# Patient Record
Sex: Female | Born: 1935 | Race: White | Hispanic: No | Smoking: Never smoker
Health system: Southern US, Community
[De-identification: ages and names within clinical notes are randomized; demographics above are authoritative.]

## PROBLEM LIST (undated history)

## (undated) DIAGNOSIS — I4891 Unspecified atrial fibrillation: Secondary | ICD-10-CM

## (undated) DIAGNOSIS — I639 Cerebral infarction, unspecified: Secondary | ICD-10-CM

## (undated) DIAGNOSIS — F419 Anxiety disorder, unspecified: Secondary | ICD-10-CM

## (undated) DIAGNOSIS — K635 Polyp of colon: Secondary | ICD-10-CM

## (undated) DIAGNOSIS — G4730 Sleep apnea, unspecified: Secondary | ICD-10-CM

## (undated) DIAGNOSIS — E785 Hyperlipidemia, unspecified: Secondary | ICD-10-CM

## (undated) DIAGNOSIS — F32A Depression, unspecified: Secondary | ICD-10-CM

## (undated) DIAGNOSIS — I10 Essential (primary) hypertension: Secondary | ICD-10-CM

## (undated) DIAGNOSIS — F329 Major depressive disorder, single episode, unspecified: Secondary | ICD-10-CM

## (undated) DIAGNOSIS — J45909 Unspecified asthma, uncomplicated: Secondary | ICD-10-CM

## (undated) DIAGNOSIS — E039 Hypothyroidism, unspecified: Secondary | ICD-10-CM

## (undated) DIAGNOSIS — C4490 Unspecified malignant neoplasm of skin, unspecified: Secondary | ICD-10-CM

## (undated) HISTORY — DX: Unspecified asthma, uncomplicated: J45.909

## (undated) HISTORY — DX: Depression, unspecified: F32.A

## (undated) HISTORY — PX: SINUS EXPLORATION: SHX5214

## (undated) HISTORY — DX: Anxiety disorder, unspecified: F41.9

## (undated) HISTORY — PX: COLONOSCOPY: SHX174

## (undated) HISTORY — DX: Unspecified malignant neoplasm of skin, unspecified: C44.90

## (undated) HISTORY — DX: Polyp of colon: K63.5

## (undated) HISTORY — PX: MESH APPLIED TO LAP PORT: SHX5969

## (undated) HISTORY — DX: Cerebral infarction, unspecified: I63.9

## (undated) HISTORY — DX: Essential (primary) hypertension: I10

## (undated) HISTORY — PX: ADENOIDECTOMY: SUR15

## (undated) HISTORY — DX: Hyperlipidemia, unspecified: E78.5

## (undated) HISTORY — DX: Hypothyroidism, unspecified: E03.9

## (undated) HISTORY — PX: CATARACT EXTRACTION: SUR2

## (undated) HISTORY — DX: Sleep apnea, unspecified: G47.30

---
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
## 1958-01-18 HISTORY — PX: APPENDECTOMY: SHX54

## 2006-01-18 DIAGNOSIS — I639 Cerebral infarction, unspecified: Secondary | ICD-10-CM

## 2006-01-18 HISTORY — DX: Cerebral infarction, unspecified: I63.9

## 2012-01-19 HISTORY — PX: LUMBAR DISC SURGERY: SHX700

## 2017-02-04 LAB — BASIC METABOLIC PANEL
BUN: 13 (ref 4–21)
Creatinine: 0.9 (ref <=1.1)
Glucose: 102
Potassium: 4.2 (ref 3.4–5.3)
Sodium: 144 (ref 137–147)

## 2017-06-14 LAB — CBC AND DIFFERENTIAL
HCT: 45 (ref 36–46)
Hemoglobin: 14.5 (ref 12.0–16.0)
Platelets: 280 (ref 150–399)
WBC: 7.5

## 2017-08-01 ENCOUNTER — Encounter: Payer: Self-pay | Admitting: Internal Medicine

## 2017-08-09 ENCOUNTER — Ambulatory Visit: Payer: Self-pay | Admitting: Internal Medicine

## 2017-08-16 ENCOUNTER — Ambulatory Visit: Payer: Self-pay | Admitting: Internal Medicine

## 2017-09-06 ENCOUNTER — Non-Acute Institutional Stay: Payer: Medicare Other | Admitting: Internal Medicine

## 2017-09-06 ENCOUNTER — Encounter: Payer: Self-pay | Admitting: Internal Medicine

## 2017-09-06 VITALS — BP 122/60 | HR 75 | Temp 97.8°F | Resp 18 | Ht 63.0 in | Wt 148.0 lb

## 2017-09-06 DIAGNOSIS — I10 Essential (primary) hypertension: Secondary | ICD-10-CM | POA: Insufficient documentation

## 2017-09-06 DIAGNOSIS — J329 Chronic sinusitis, unspecified: Secondary | ICD-10-CM

## 2017-09-06 DIAGNOSIS — E785 Hyperlipidemia, unspecified: Secondary | ICD-10-CM

## 2017-09-06 DIAGNOSIS — Z8673 Personal history of transient ischemic attack (TIA), and cerebral infarction without residual deficits: Secondary | ICD-10-CM | POA: Insufficient documentation

## 2017-09-06 DIAGNOSIS — F411 Generalized anxiety disorder: Secondary | ICD-10-CM

## 2017-09-06 DIAGNOSIS — E039 Hypothyroidism, unspecified: Secondary | ICD-10-CM

## 2017-09-06 DIAGNOSIS — K219 Gastro-esophageal reflux disease without esophagitis: Secondary | ICD-10-CM

## 2017-09-06 MED ORDER — DIAZEPAM 5 MG PO TABS: ORAL_TABLET | ORAL | 1 refills | Status: DC

## 2017-09-06 NOTE — Progress Notes (Signed)
[facility]  Provider: Blanchie Serve MD   Location:      Place of Service:     PCP: System, Pcp Not In [name] Care Team: System, Pcp Not In as PCP - General  Extended Emergency Contact Information Primary Emergency Contact: Darlene [name] Mobile Phone: [phone] Relation: Brother   Goals of Care: Advanced Directive information No flowsheet data found.    Chief Complaint  [name] presents with  . New [name] (Initial Visit)    Establishing care with [facility]. No acute concerns at this time  . Medication Refill    No refills needed at this time    HPI: [name] is a 84 y.o. female seen today to establish care. She resides in independent living. She moved here two and a half week. Her husband has dementia and resides in [facility]. She has a brother in town. She moved from [place], [place]. She was seeing Dr Darlene [name] in [place] and was last seen 3-4 weeks back for sinus issues. During this visit, she is mostly talking about her husband.   Hypertension- currently on losartan 50 mg daily, no headache or chest pain, denies dyspnea.   GAD- takes diazepam 5 mg three times a day. Appears anxious this visit.   Hyperlipidemia- takes pravastatin 40 mg daily  Tylenol 650 mg daily as needed  Hypothyroidism- takes levothyroxine 100 mcg daily, has been on it for several years.   Chronic sinusitis- takes azelastine and flonase nasal spray.  History of stroke- 11 years back and has lost most of her left peripheral vision. Currently on plavix daily. On losartan for hypertension. Also takes pravastatin 40 mg daily.   GERD- takes dexilant 30 mg daily as needed   Past Medical History:  Diagnosis Date  . Hyperlipidemia   . Hypertension   . Hypothyroidism   . Sleep apnea   . Stroke [facility]) 2008   Past Surgical History:  Procedure Laterality Date  . APPENDECTOMY  1960  . [place] SURGERY  2014  . TONSILLECTOMY  1940    reports that she has never smoked.  She has never used smokeless tobacco. She reports that she drinks about 2.0 standard drinks of alcohol per week. She reports that she does not use drugs. Social History   Socioeconomic History  . Marital status: Divorced    Spouse name: Not on file  . Number of children: Not on file  . Years of education: Not on file  . Highest education level: Not on file  Occupational History  . Not on file  Social Needs  . Financial resource strain: Not on file  . Food insecurity:    Worry: Not on file    Inability: Not on file  . Transportation needs:    Medical: Not on file    Non-medical: Not on file  Tobacco Use  . Smoking status: Never Smoker  . Smokeless tobacco: Never Used  Substance and Sexual Activity  . Alcohol use: Yes    Alcohol/week: 2.0 standard drinks    Types: 2 Glasses of wine per week  . Drug use: Never  . Sexual activity: Not on file  Lifestyle  . Physical activity:    Days per week: Not on file    Minutes per session: Not on file  . Stress: Not on file  Relationships  . Social connections:    Talks on phone: Not on file    Gets together: Not on file    Attends religious service:  Not on file    Active member of club or organization: Not on file    Attends meetings of clubs or organizations: Not on file    Relationship status: Not on file  . Intimate partner violence:    Fear of current or ex partner: Not on file    Emotionally abused: Not on file    Physically abused: Not on file    Forced sexual activity: Not on file  Other Topics Concern  . Not on file  Social History Narrative   Social History      Diet? ok      Do you drink/eat things with caffeine? Weak coffee, (much milk)      Marital status?          divorced                          What year were you married? 1961      Do you live in a house, apartment, assisted living, condo, trailer, etc.? apartment      Is it one or more stories? 4      How many persons live in your home? Only me      Do  you have any pets in your home? (please list) no      Highest level of education completed? 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             yes                         Type & how often? Walk- would love to return to water exercise      Advanced Directives      Do you have a living will? yes      Do you have a DNR form?           no                       If not, do you want to discuss one? no      Do you have signed POA/HPOA for forms? no      Functional Status      Do you have difficulty bathing or dressing yourself?  no      Do you have difficulty preparing food or eating? no      Do you have difficulty managing your medications? no      Do you have difficulty managing your finances?  no      Do you have difficulty affording your medications? no       Functional Status Survey:    Family History  Problem Relation Age of Onset  . Lung cancer Mother 67  . Stroke Father   . Cancer Maternal Grandfather   . Heart attack Paternal Grandfather   . Psoriasis Daughter   . Rheum arthritis Daughter   . Bipolar disorder Daughter     Health Maintenance  Topic Date Due  . DEXA SCAN  07/17/2000  . PNA vac Low Risk Adult (1 of 2 - PCV13) 07/17/2000  . INFLUENZA VACCINE  08/18/2017  . TETANUS/TDAP  11/26/2018    Allergies  Allergen Reactions  . Dust Mite Extract   . Oxycontin [Oxycodone Hcl] Nausea And Vomiting  . Pollen Extract     Outpatient Encounter  Medications as of 09/06/2017  Medication Sig  . acetaminophen (TYLENOL) 325 MG tablet Take 650 mg by mouth as needed (for sinus headache).  Marland Kitchen azelastine (ASTELIN) 0.1 % nasal spray Place into both nostrils as needed for rhinitis. 2 sprays in the right nostril and 1 spray in the left nostril  . clopidogrel (PLAVIX) 75 MG tablet Take 75 mg by mouth daily.  Marland Kitchen Dexlansoprazole (DEXILANT) 30 MG capsule Take 30 mg by mouth daily as needed.  . diazepam  (VALIUM) 5 MG tablet Take 5 mg by mouth 3 (three) times daily.  . fluticasone (FLONASE) 50 MCG/ACT nasal spray Place 1 spray into both nostrils daily. 2 sprays in the right nostril and 1 spray in the left nostril  . levothyroxine (SYNTHROID, LEVOTHROID) 100 MCG tablet Take 100 mcg by mouth daily before breakfast.  . losartan (COZAAR) 50 MG tablet Take 50 mg by mouth daily.  . pravastatin (PRAVACHOL) 40 MG tablet Take 40 mg by mouth at bedtime.    No facility-administered encounter medications on file as of 09/06/2017.     Review of Systems  Constitutional: Negative for appetite change, chills and fever.  HENT: Positive for congestion. Negative for ear discharge, ear pain, hearing loss, mouth sores, postnasal drip, rhinorrhea, sore throat and trouble swallowing.   Eyes: Negative for pain and itching.       Poor peripheral vision to left eye s/p stroke  Respiratory: Negative for cough, choking, shortness of breath and wheezing.        Has had choking issues in past, last one a year back  Cardiovascular: Negative for chest pain and palpitations.  Gastrointestinal: Negative for abdominal pain, constipation, diarrhea, nausea and vomiting.       Colonoscopy and EGD 3 years back, no abnormality noted, was started on dexilant as needed about 2 weeks back and used it for a week  Genitourinary: Negative for dysuria and flank pain.  Musculoskeletal: Negative for arthralgias, back pain and gait problem.  Skin: Negative for rash.       Has had history of keratosis  Psychiatric/Behavioral: Negative for behavioral problems. The [name] is nervous/anxious.     Vitals:   09/06/17 1012  BP: 122/60  Pulse: 75  Resp: 18  Temp: 97.8 F (36.6 C)  TempSrc: Oral  SpO2: 96%  Weight: 148 lb (67.1 kg)  Height: 5\' 3"  (1.6 m)   Body mass index is 26.22 kg/m. Physical Exam  Constitutional: She is oriented to person, place, and time. She appears well-developed and well-nourished. No distress.  HENT:    Head: Normocephalic and atraumatic.  Right Ear: External ear normal.  Left Ear: External ear normal.  Nose: Nose normal.  Mouth/Throat: Oropharynx is clear and moist. No oropharyngeal exudate.  Eyes: Pupils are equal, round, and reactive to light. Conjunctivae and EOM are normal. Right eye exhibits no discharge. Left eye exhibits no discharge.  Neck: Normal range of motion. Neck supple.  Cardiovascular: Normal rate and regular rhythm.  Pulmonary/Chest: Effort normal and breath sounds normal. No respiratory distress. She has no wheezes. She has no rales.  Abdominal: Soft. Bowel sounds are normal. There is no tenderness. There is no guarding.  Musculoskeletal: Normal range of motion. She exhibits no edema or tenderness.  Lymphadenopathy:    She has no cervical adenopathy.  Neurological: She is alert and oriented to person, place, and time. She exhibits normal muscle tone.  Skin: Skin is warm and dry. Capillary refill takes less than 2 seconds. She is not diaphoretic.  Psychiatric: She has a normal mood and affect.    Labs reviewed: Basic Metabolic Panel: No results for input(s): NA, K, CL, CO2, GLUCOSE, BUN, CREATININE, CALCIUM, MG, PHOS in the last 8760 hours. Liver Function Tests: No results for input(s): AST, ALT, ALKPHOS, BILITOT, PROT, ALBUMIN in the last 8760 hours. No results for input(s): LIPASE, AMYLASE in the last 8760 hours. No results for input(s): AMMONIA in the last 8760 hours. CBC: No results for input(s): WBC, NEUTROABS, HGB, HCT, MCV, PLT in the last 8760 hours. Cardiac Enzymes: No results for input(s): CKTOTAL, CKMB, CKMBINDEX, TROPONINI in the last 8760 hours. BNP: Invalid input(s): POCBNP No results found for: HGBA1C No results found for: TSH No results found for: VITAMINB12 No results found for: FOLATE No results found for: IRON, TIBC, FERRITIN  Lipid Panel: No results for input(s): CHOL, HDL, LDLCALC, TRIG, CHOLHDL, LDLDIRECT in the last 8760 hours. No  results found for: HGBA1C  Procedures since last visit: No results found.  Assessment/Plan  1. Essential hypertension Continue losartan current regimen. Obtain previous records and then schedule for lab prior to next visit.   2. History of CVA (cerebrovascular accident) Continue plavix, statin and antihypertensive obvtain prior records including imaging  3. Hyperlipidemia, unspecified hyperlipidemia type Continue pravastatin 40 mg daily.   4. GAD (generalized anxiety disorder) Continue diazepam 5 mg half tablet in am as needed and full tablet at night as needed for anxiety, recommended psychotherapy, pt is looking into establishing care with one.   5. Acquired hypothyroidism Continue levothyroxine, obtain previous lab results  6. Chronic sinusitis, unspecified location Continue nasal spray  7. Gastroesophageal reflux disease, esophagitis presence not specified Continue dexilant as needed, monitor clinically.     Labs/tests ordered:  None for now  Next appointment: 3 months with [facility]  Communication: reviewed care plan with [name]    Blanchie Serve, MD Internal Medicine [place], [place] [address] Cell Phone (Monday-Friday 8 am - 5 pm): [phone] On Call: [phone] and follow prompts after 5 pm and on weekends Office Phone: [phone] Office Fax: [phone]

## 2017-09-27 ENCOUNTER — Telehealth: Payer: Self-pay | Admitting: Internal Medicine

## 2017-09-27 NOTE — Telephone Encounter (Signed)
I left a message asking the patient to call me at [phone] to schedule AWV-I with Clarise Cruz at [facility] on afternoon of 10/04/17 if available. VDM (DD)

## 2017-09-28 ENCOUNTER — Telehealth: Payer: Self-pay | Admitting: *Deleted

## 2017-09-28 NOTE — Telephone Encounter (Signed)
Patient called requesting referrals for cardiology, ENT, dermatology, she stated that she has a family history of heart problems and sinus problems, skin issues.

## 2017-09-28 NOTE — Telephone Encounter (Signed)
I would like to review some of her old medical records from her prior PCP office. Please make sure a signed medical release form has been faxed to her previous PCP office and records obtained- this should include recent lab work, EKG, other imagings like MRI, CT scan and Xrays if present and any sub speciality note if present. She does not have a known cardiac issue at present that I see in her chart. Was she seeing an ENT provider, a cardiologist or dermatologist while in [place]? Please let me know the name of the provider and when was she last seen by them after talking to the patient.

## 2017-10-04 NOTE — Telephone Encounter (Signed)
Patient's records was given to Dr Bubba Camp on 09/29/2017.

## 2017-10-05 ENCOUNTER — Other Ambulatory Visit: Payer: Self-pay | Admitting: *Deleted

## 2017-10-05 NOTE — Telephone Encounter (Signed)
Called patient to see if the medical records release form was suppose to be for my office or for another office since Darlene [name] received this form and brought it to me. She stated that she was not quite sure, I infored her that I would call Dr. Einar Gip office tomorrow to find out and I will get back with her since I have her records already.

## 2017-10-07 ENCOUNTER — Other Ambulatory Visit: Payer: Self-pay | Admitting: *Deleted

## 2017-10-07 MED ORDER — DIAZEPAM 5 MG PO TABS: ORAL_TABLET | ORAL | 0 refills | Status: DC

## 2017-10-07 MED ORDER — DIAZEPAM 5 MG PO TABS: 5.0000 mg | ORAL_TABLET | Freq: Two times a day (BID) | ORAL | 1 refills | Status: DC | PRN

## 2017-10-07 NOTE — Telephone Encounter (Signed)
Patient called to get refill, new to [facility]. She stated that she needs to start getting this medication from Dr. Bubba Camp.

## 2017-10-18 ENCOUNTER — Other Ambulatory Visit: Payer: Self-pay | Admitting: *Deleted

## 2017-10-18 MED ORDER — CLOPIDOGREL BISULFATE 75 MG PO TABS: 75.0000 mg | ORAL_TABLET | Freq: Every day | ORAL | 1 refills | Status: DC

## 2017-10-18 NOTE — Telephone Encounter (Signed)
Patient requested refill

## 2017-11-10 ENCOUNTER — Telehealth: Payer: Self-pay | Admitting: *Deleted

## 2017-11-10 DIAGNOSIS — J329 Chronic sinusitis, unspecified: Secondary | ICD-10-CM

## 2017-11-10 NOTE — Telephone Encounter (Signed)
Patient called and stated that she just moved here and resides at [facility]. Stated that she would like a referral to a ENT near [facility]. Patient stated that she has chronic sinus problems throughout the year. Please Advise.

## 2017-11-11 NOTE — Telephone Encounter (Signed)
Called patient and she stated that she would rather go ahead an get started with the specialist.

## 2017-11-11 NOTE — Telephone Encounter (Signed)
We can see her in clinic ASAP unless she wants to see ENT only.

## 2017-11-11 NOTE — Telephone Encounter (Signed)
Faxed last note, insurance information and referral paperwork.

## 2017-11-11 NOTE — Telephone Encounter (Signed)
Spoke with patient regarding her referral, she stated that she is having to get up several times during the night to clear her nasal passages. She also stated that she would like to see someone as soon as possible.

## 2017-11-28 ENCOUNTER — Telehealth: Payer: Self-pay | Admitting: *Deleted

## 2017-11-28 NOTE — Telephone Encounter (Signed)
Patient called regarding her (L) knee being infected, she stated that she had some cancerous cells removed and the knee had become infected. She stated that she was given some antibiotics but they have not been working. She has been confined to her apartment because she is unable to get dress to go out. I told her that I would have the Independent nurse to come and access the wound and I would also schedule her appointment for her at 1:00 pm on Thursday to see [facility] [name] NP. She stated that she had a scheduled appointment for this month.

## 2017-12-01 ENCOUNTER — Non-Acute Institutional Stay: Payer: Medicare Other | Admitting: Nurse Practitioner

## 2017-12-01 ENCOUNTER — Encounter: Payer: Self-pay | Admitting: Nurse Practitioner

## 2017-12-01 DIAGNOSIS — F411 Generalized anxiety disorder: Secondary | ICD-10-CM

## 2017-12-01 DIAGNOSIS — R32 Unspecified urinary incontinence: Secondary | ICD-10-CM

## 2017-12-01 DIAGNOSIS — E039 Hypothyroidism, unspecified: Secondary | ICD-10-CM

## 2017-12-01 DIAGNOSIS — T8149XA Infection following a procedure, other surgical site, initial encounter: Secondary | ICD-10-CM | POA: Diagnosis not present

## 2017-12-01 DIAGNOSIS — Z8673 Personal history of transient ischemic attack (TIA), and cerebral infarction without residual deficits: Secondary | ICD-10-CM

## 2017-12-01 DIAGNOSIS — I10 Essential (primary) hypertension: Secondary | ICD-10-CM | POA: Diagnosis not present

## 2017-12-01 DIAGNOSIS — E785 Hyperlipidemia, unspecified: Secondary | ICD-10-CM | POA: Diagnosis not present

## 2017-12-01 DIAGNOSIS — J329 Chronic sinusitis, unspecified: Secondary | ICD-10-CM

## 2017-12-01 LAB — COMPLETE METABOLIC PANEL WITH GFR
Calcium: 10.1
Chloride: 105
Vitamin B6: 30

## 2017-12-01 NOTE — Assessment & Plan Note (Signed)
Blood pressure is in control, continue Losartan 83m qd, update CMP eGFR

## 2017-12-01 NOTE — Assessment & Plan Note (Signed)
Stable, continue Flonase, update CBC

## 2017-12-01 NOTE — Progress Notes (Signed)
Location:   clinic [place]   Place of Service:  Clinic (12) clinic [place]  Provider: Marlana Latus NP  Code Status: DNR Goals of Care: No flowsheet data found.   Chief Complaint  Patient presents with  . Acute Visit    C/o-(R) knee infected    HPI: Patient is a 84 y.o. female seen today for an acute visit for left knee infected surgical wound from skin lesion removal, s/p ABT. Hx of HTN, blood pressure is controlled on Losartan 81m qd. Hx of hypothyroidism on Levothyroxine 1022m qd, no recent labs. She takes Pravastatin 4078md for hyperlipidemia control. Hx of CVA, no apparent residual, on Plavix and pravastatin for cardiovascular risk reduction. Chronic sinusitis, stable, on Flonase nasal pray.   Past Medical History:  Diagnosis Date  . Hyperlipidemia   . Hypertension   . Hypothyroidism   . Sleep apnea   . [facility]    Past Surgical History:  Procedure Laterality Date  . [place]  1960  [place]  . TONSILLECTOMY  1940    Allergies  Allergen Reactions  . Dust Mite Extract   . Oxycontin [Oxycodone Hcl] Nausea And Vomiting  . Pollen Extract     Allergies as of 12/01/2017      Reactions   Dust Mite Extract    Oxycontin [oxycodone Hcl] Nausea And Vomiting   Pollen Extract       Medication List        Accurate as of 12/01/17  1:56 PM. Always use your most recent med list.          acetaminophen 325 MG tablet Commonly known as:  TYLENOL Take 650 mg by mouth as needed (for sinus headache).   azelastine 0.1 % nasal spray Commonly known as:  ASTELIN Place into both nostrils as needed for rhinitis. 2 sprays in the right nostril and 1 spray in the left nostril   cephALEXin 500 MG capsule Commonly known as:  KEFLEX Take 500 mg by mouth 2 (two) times daily.   clopidogrel 75 MG tablet Commonly known as:  PLAVIX Take 1 tablet (75 mg total) by mouth daily.   DEXILANT 30 MG capsule Generic drug:  Dexlansoprazole Take 30 mg by mouth  daily as needed.   diazepam 5 MG tablet Commonly known as:  VALIUM Take 1 tablet (5 mg total) by mouth every 12 (twelve) hours as needed for anxiety.   fluticasone 50 MCG/ACT nasal spray Commonly known as:  FLONASE Place 1 spray into both nostrils daily. 2 sprays in the right nostril and 1 spray in the left nostril   levothyroxine 100 MCG tablet Commonly known as:  SYNTHROID, LEVOTHROID Take 100 mcg by mouth daily before breakfast.   losartan 50 MG tablet Commonly known as:  COZAAR Take 50 mg by mouth daily.   pravastatin 40 MG tablet Commonly known as:  PRAVACHOL Take 40 mg by mouth at bedtime.       Review of Systems:  Review of Systems  Constitutional: Negative for activity change, appetite change, chills, diaphoresis, fatigue, fever and unexpected weight change.  HENT: Positive for hearing loss. Negative for congestion, rhinorrhea, sinus pressure, sinus pain and sore throat.   Eyes:       Hx of poor left peripheral vision from Hx of CVA  Respiratory: Negative for cough, shortness of breath and wheezing.   Gastrointestinal: Negative for abdominal distention, abdominal pain, constipation, diarrhea, nausea and vomiting.  Genitourinary: Negative for difficulty urinating,  dysuria and urgency.       Incontinent urine.   Musculoskeletal: Positive for arthralgias and gait problem.  Skin: Negative for color change.       Left knee infected surgical wound.   Neurological: Negative for dizziness, facial asymmetry, speech difficulty, weakness and headaches.       Left knee reflex is sluggish since the back surgery.   Psychiatric/Behavioral: Negative for agitation, behavioral problems, hallucinations and sleep disturbance. The patient is nervous/anxious.     Health Maintenance  Topic Date Due  . DEXA SCAN  07/17/2000  . PNA vac Low Risk Adult (2 of 2 - PCV13) 01/18/2006  . INFLUENZA VACCINE  08/18/2017  . TETANUS/TDAP  11/26/2018    Physical Exam: Vitals:   12/01/17 1304    BP: 136/80  Pulse: 77  Resp: 18  Temp: (!) 97.4 F (36.3 C)  TempSrc: Oral  SpO2: 95%  Weight: 147 lb 9.6 oz (67 kg)  Height: 5' 3"  (1.6 m)   Body mass index is 26.15 kg/m. Physical Exam  Constitutional: She is oriented to person, place, and time. She appears well-developed and well-nourished.  HENT:  Head: Normocephalic and atraumatic.  Eyes: Pupils are equal, round, and reactive to light. EOM are normal.  Left peripheral vision loss  Neck: Normal range of motion. Neck supple. No JVD present. No thyromegaly present.  Cardiovascular: Normal rate and regular rhythm.  No murmur heard. Pulmonary/Chest: Effort normal. She has no wheezes. She has no rales.  Abdominal: Soft. She exhibits no distension. There is no tenderness. There is no rebound and no guarding.  Musculoskeletal: Normal range of motion.  Ambulates independently.   Neurological: She is alert and oriented to person, place, and time. No cranial nerve deficit. She exhibits normal muscle tone. Coordination normal.  Skin: Skin is warm and dry.  Left knee infected surgical wound, a quarter sized excision wound is covered with yellow scant drainage, peri wound erythema, slightly warmth and tenderness.  Psychiatric: She has a normal mood and affect. Her behavior is normal. Judgment and thought content normal.    Labs reviewed: Basic Metabolic Panel: No results for input(s): NA, K, CL, CO2, GLUCOSE, BUN, CREATININE, CALCIUM, MG, PHOS, TSH in the last 8760 hours. Liver Function Tests: No results for input(s): AST, ALT, ALKPHOS, BILITOT, PROT, ALBUMIN in the last 8760 hours. No results for input(s): LIPASE, AMYLASE in the last 8760 hours. No results for input(s): AMMONIA in the last 8760 hours. CBC: No results for input(s): WBC, NEUTROABS, HGB, HCT, MCV, PLT in the last 8760 hours. Lipid Panel: No results for input(s): CHOL, HDL, LDLCALC, TRIG, CHOLHDL, LDLDIRECT in the last 8760 hours. No results found for:  HGBA1C  Procedures since last visit: No results found.  Assessment/Plan Acquired hypothyroidism Continue Levothyroxine 129mg qd, update TSH  Essential hypertension Blood pressure is in control, continue Losartan 561mqd, update CMP eGFR  Hyperlipidemia Continue Pravastatin, update lipid panel.   History of CVA (cerebrovascular accident) No apparent residual, continue Pravastatin and Plavix for cardiovascular risk reduction.   Chronic sinusitis Stable, continue Flonase, update CBC  Infected surgical wound Medial the right knee, will apply Bacitracin oint bid to affected the area until healed.   GAD (generalized anxiety disorder) Prn Valium 54m34mid is adequate for sleep and anxiety.   Incontinent of urine Hx of it, failed surgery, wear adult depends.     Labs/tests ordered: CBC/diff, CMP eGFR, TSH, lipid panel.   Next appt: 4 months

## 2017-12-01 NOTE — Assessment & Plan Note (Signed)
Continue Pravastatin, update lipid panel.

## 2017-12-01 NOTE — Assessment & Plan Note (Signed)
No apparent residual, continue Pravastatin and Plavix for cardiovascular risk reduction.

## 2017-12-01 NOTE — Assessment & Plan Note (Signed)
Medial the right knee, will apply Bacitracin oint bid to affected the area until healed.

## 2017-12-01 NOTE — Assessment & Plan Note (Signed)
Hx of it, failed surgery, wear adult depends.

## 2017-12-01 NOTE — Assessment & Plan Note (Signed)
Continue Levothyroxine 184mcg qd, update TSH

## 2017-12-01 NOTE — Assessment & Plan Note (Signed)
Prn Valium 5mg  bid is adequate for sleep and anxiety.

## 2017-12-01 NOTE — Patient Instructions (Signed)
CBC/diff, CMP, TSH, lipid panel 12/06/17. F/u in clinic 4 months.

## 2017-12-02 ENCOUNTER — Encounter: Payer: Self-pay | Admitting: Nurse Practitioner

## 2017-12-05 ENCOUNTER — Other Ambulatory Visit: Payer: Self-pay | Admitting: *Deleted

## 2017-12-05 DIAGNOSIS — I10 Essential (primary) hypertension: Secondary | ICD-10-CM

## 2017-12-05 DIAGNOSIS — E785 Hyperlipidemia, unspecified: Secondary | ICD-10-CM

## 2017-12-05 DIAGNOSIS — T8149XA Infection following a procedure, other surgical site, initial encounter: Secondary | ICD-10-CM

## 2017-12-05 DIAGNOSIS — E039 Hypothyroidism, unspecified: Secondary | ICD-10-CM

## 2017-12-06 ENCOUNTER — Other Ambulatory Visit: Payer: Self-pay

## 2017-12-07 ENCOUNTER — Telehealth: Payer: Self-pay | Admitting: *Deleted

## 2017-12-07 NOTE — Telephone Encounter (Signed)
Patient called regarding her infected (L) knee, she stated that her knee is still hurts,  puffy and swollen. She also stated that this is keeping her from being able to get dressed or do anything.I informed her that I would let [facility] know of her concerns and will give her a call back.

## 2017-12-07 NOTE — Telephone Encounter (Signed)
ManXie recommended that the patient use Bactroban ointment/Doxy bid x 7 days or follow up with dermatology. After talking with patient she agreed to call dermatology regarding this matter because she felt that this a issue that began with a surgery that they performed. She stated that she would give them a call to see if she could get an appointment this afternoon.

## 2017-12-08 ENCOUNTER — Other Ambulatory Visit: Payer: Medicare Other

## 2017-12-08 ENCOUNTER — Encounter: Payer: Self-pay | Admitting: Nurse Practitioner

## 2017-12-12 ENCOUNTER — Other Ambulatory Visit: Payer: Self-pay | Admitting: *Deleted

## 2017-12-12 ENCOUNTER — Telehealth: Payer: Self-pay | Admitting: *Deleted

## 2017-12-12 DIAGNOSIS — R5383 Other fatigue: Secondary | ICD-10-CM

## 2017-12-12 NOTE — Telephone Encounter (Signed)
Darlene [name] called regarding her labs, she stated that she will be getting lab done this Tuesday. She also stated that she has been unable to stay awake during the day and wondered if there is any labs that can be checked to see if any of her medication is causing this issue.

## 2017-12-13 LAB — CBC WITH DIFFERENTIAL/PLATELET
Basophils Absolute: 49 cells/uL (ref 0–200)
Basophils Relative: 0.9 %
Eosinophils Absolute: 189 cells/uL (ref 15–500)
Eosinophils Relative: 3.5 %
HCT: 41 % (ref 35.0–45.0)
Hemoglobin: 13.8 g/dL (ref 11.7–15.5)
Lymphs Abs: 2144 cells/uL (ref 850–3900)
MCH: 29.3 pg (ref 27.0–33.0)
MCHC: 33.7 g/dL (ref 32.0–36.0)
MCV: 87 fL (ref 80.0–100.0)
MPV: 11.6 fL (ref 7.5–12.5)
Monocytes Relative: 6.9 %
Neutro Abs: 2646 cells/uL (ref 1500–7800)
Neutrophils Relative %: 49 %
Platelets: 256 10*3/uL (ref 140–400)
RBC: 4.71 10*6/uL (ref 3.80–5.10)
RDW: 12.3 % (ref 11.0–15.0)
Total Lymphocyte: 39.7 %
WBC mixed population: 373 cells/uL (ref 200–950)
WBC: 5.4 10*3/uL (ref 3.8–10.8)

## 2017-12-13 LAB — COMPLETE METABOLIC PANEL WITH GFR
AG Ratio: 2.1 (calc) (ref 1.0–2.5)
ALT: 16 U/L (ref 6–29)
AST: 14 U/L (ref 10–35)
Albumin: 4.1 g/dL (ref 3.6–5.1)
Alkaline phosphatase (APISO): 70 U/L (ref 33–130)
BUN: 15 mg/dL (ref 7–25)
CO2: 26 mmol/L (ref 20–32)
Calcium: 9.5 mg/dL (ref 8.6–10.4)
Chloride: 111 mmol/L — ABNORMAL HIGH (ref 98–110)
Creat: 0.79 mg/dL (ref 0.60–0.88)
GFR, Est African American: 81 mL/min/{1.73_m2} (ref >=60)
GFR, Est Non African American: 70 mL/min/{1.73_m2} (ref >=60)
Globulin: 2 g/dL (calc) (ref 1.9–3.7)
Glucose, Bld: 114 mg/dL — ABNORMAL HIGH (ref 65–99)
Potassium: 4.2 mmol/L (ref 3.5–5.3)
Sodium: 144 mmol/L (ref 135–146)
Total Bilirubin: 0.3 mg/dL (ref 0.2–1.2)
Total Protein: 6.1 g/dL (ref 6.1–8.1)

## 2017-12-13 LAB — TSH: TSH: 1.85 mIU/L (ref 0.40–4.50)

## 2017-12-13 LAB — LIPID PANEL
Cholesterol: 149 mg/dL (ref <200)
HDL: 46 mg/dL — ABNORMAL LOW (ref >50)
LDL Cholesterol (Calc): 82 mg/dL (calc)
Non-HDL Cholesterol (Calc): 103 mg/dL (calc) (ref <130)
Total CHOL/HDL Ratio: 3.2 (calc) (ref <5.0)
Triglycerides: 110 mg/dL (ref <150)

## 2017-12-21 ENCOUNTER — Other Ambulatory Visit: Payer: Self-pay | Admitting: *Deleted

## 2017-12-21 MED ORDER — LOSARTAN POTASSIUM 100 MG PO TABS: ORAL_TABLET | ORAL | 1 refills | Status: DC

## 2017-12-21 MED ORDER — DIAZEPAM 5 MG PO TABS: ORAL_TABLET | ORAL | 1 refills | Status: DC

## 2017-12-21 NOTE — Telephone Encounter (Signed)
Patient called regarding medication refill for her losartan and valium. She stated that [facility] is currently on backorder for the 50 mg losartan but does have 100 mg. Patient stated that she could cut the 100 mg tablet with her splitter.

## 2018-01-18 HISTORY — PX: MELANOMA EXCISION: SHX5266

## 2018-01-31 ENCOUNTER — Ambulatory Visit: Payer: Medicare Other | Admitting: Allergy & Immunology

## 2018-01-31 ENCOUNTER — Encounter: Payer: Self-pay | Admitting: Allergy & Immunology

## 2018-01-31 VITALS — BP 118/64 | HR 60 | Temp 98.1°F | Resp 16 | Ht 62.5 in | Wt 157.6 lb

## 2018-01-31 DIAGNOSIS — J3089 Other allergic rhinitis: Secondary | ICD-10-CM | POA: Diagnosis not present

## 2018-01-31 DIAGNOSIS — J302 Other seasonal allergic rhinitis: Secondary | ICD-10-CM | POA: Insufficient documentation

## 2018-01-31 MED ORDER — AZELASTINE HCL 0.1 % NA SOLN: 2.0000 | Freq: Two times a day (BID) | NASAL | 5 refills | Status: DC

## 2018-01-31 NOTE — Patient Instructions (Addendum)
1. Chronic rhinitis - Testing today showed: trees, weeds, grasses, indoor molds, outdoor molds, dust mites, cat, dog and cockroach (molds were the most reactive) - Copy of test results provided.  - Avoidance measures provided. - Continue with: nasal saline rinses - Start taking: Astelin (azelastine) 2 sprays per nostril 1-2 times daily as needed and we are giving you different antihistamine samples to try - Call us when you decide which antihistamine you would like [place] to send in.  - You can use an extra dose of the antihistamine, if needed, for breakthrough symptoms.  - Consider nasal saline rinses 1-2 times daily to remove allergens from the nasal cavities as well as help with mucous clearance (this is especially helpful to do before the nasal sprays are given) - Consider allergy shots as a means of long-term control. - Allergy shots "re-train" and "reset" the immune system to ignore environmental allergens and decrease the resulting immune response to those allergens (sneezing, itchy watery eyes, runny nose, nasal congestion, etc).    - Allergy shots improve symptoms in 75-85% of patients.  - We can discuss more at the next appointment if the medications are not working for you.  2. Return in about 4 weeks (around 02/28/2018).   Please inform us of any Emergency Department visits, hospitalizations, or changes in symptoms. Call us before going to the ED for breathing or allergy symptoms since we might be able to fit you in for a sick visit. Feel free to contact us anytime with any questions, problems, or concerns.  It was a pleasure to see you and your family again today!  Websites that have reliable patient information: 1. American Academy of Asthma, Allergy, and Immunology: www.aaaai.org 2. Food Allergy Research and Education (FARE): foodallergy.org 3. Mothers of Asthmatics: http://www.asthmacommunitynetwork.org 4. American College of Allergy, Asthma, and Immunology:  MonthlyElectricBill.co.uk   Make sure you are registered to vote! If you have moved or changed any of your contact information, you will need to get this updated before voting!    Voter ID laws are going into effect for the General Election in November 2020! Be prepared! Check out http://levine.[email or web address] for more details.     Reducing Pollen Exposure  The American Academy of Allergy, Asthma and Immunology suggests the following steps to reduce your exposure to pollen during allergy seasons.    1. Do not hang sheets or clothing out to dry; pollen may collect on these items. 2. Do not mow lawns or spend time around freshly cut grass; mowing stirs up pollen. 3. Keep windows closed at night.  Keep car windows closed while driving. 4. Minimize morning activities outdoors, a time when pollen counts are usually at their highest. 5. Stay indoors as much as possible when pollen counts or humidity is high and on windy days when pollen tends to remain in the air longer. 6. Use air conditioning when possible.  Many air conditioners have filters that trap the pollen spores. 7. Use a HEPA room air filter to remove pollen form the indoor air you breathe.  Control of Mold Allergen   Mold and fungi can grow on a variety of surfaces provided certain temperature and moisture conditions exist.  Outdoor molds grow on plants, decaying vegetation and soil.  The major outdoor mold, Alternaria and Cladosporium, are found in very high numbers during hot and dry conditions.  Generally, a late Summer - Fall peak is seen for common outdoor fungal spores.  Rain will temporarily lower outdoor mold spore count, but  counts rise rapidly when the rainy period ends.  The most important indoor molds are Aspergillus and Penicillium.  Dark, humid and poorly ventilated basements are ideal sites for mold growth.  The next most common sites of mold growth are the bathroom and the kitchen.  Outdoor (Seasonal) Mold Control  Positive  outdoor molds via skin testing: Alternaria, Cladosporium, Bipolaris (Helminthsporium), Drechslera (Curvalaria) and Mucor  1. Use air conditioning and keep windows closed 2. Avoid exposure to decaying vegetation. 3. Avoid leaf raking. 4. Avoid grain handling. 5. Consider wearing a face mask if working in moldy areas.  6.   Indoor (Perennial) Mold Control   Positive indoor molds via skin testing: Aspergillus, Penicillium, Fusarium, Aureobasidium (Pullulara) and Rhizopus  1. Maintain humidity below 50%. 2. Clean washable surfaces with 5% bleach solution. 3. Remove sources e.g. contaminated carpets.     Control of House Dust Mite Allergen    House dust mites play a major role in allergic asthma and rhinitis.  They occur in environments with high humidity wherever human skin, the food for dust mites is found. High levels have been detected in dust obtained from mattresses, pillows, carpets, upholstered furniture, bed covers, clothes and soft toys.  The principal allergen of the house dust mite is found in its feces.  A gram of dust may contain 1,000 mites and 250,000 fecal particles.  Mite antigen is easily measured in the air during house cleaning activities.    1. Encase mattresses, including the box spring, and pillow, in an air tight cover.  Seal the zipper end of the encased mattresses with wide adhesive tape. 2. Wash the bedding in water of 130 degrees Farenheit weekly.  Avoid cotton comforters/quilts and flannel bedding: the most ideal bed covering is the dacron comforter. 3. Remove all upholstered furniture from the bedroom. 4. Remove carpets, carpet padding, rugs, and non-washable window drapes from the bedroom.  Wash drapes weekly or use plastic window coverings. 5. Remove all non-washable stuffed toys from the bedroom.  Wash stuffed toys weekly. 6. Have the room cleaned frequently with a vacuum cleaner and a damp dust-mop.  The patient should not be in a room which is being  cleaned and should wait 1 hour after cleaning before going into the room. 7. Close and seal all heating outlets in the bedroom.  Otherwise, the room will become filled with dust-laden air.  An electric heater can be used to heat the room. 8. Reduce indoor humidity to less than 50%.  Do not use a humidifier.  Control of Dog or Cat Allergen  Avoidance is the best way to manage a dog or cat allergy. If you have a dog or cat and are allergic to dog or cats, consider removing the dog or cat from the home. If you have a dog or cat but don't want to find it a new home, or if your family wants a pet even though someone in the household is allergic, here are some strategies that may help keep symptoms at bay:  1. Keep the pet out of your bedroom and restrict it to only a few rooms. Be advised that keeping the dog or cat in only one room will not limit the allergens to that room. 2. Don't pet, hug or kiss the dog or cat; if you do, wash your hands with soap and water. 3. High-efficiency particulate air (HEPA) cleaners run continuously in a bedroom or living room can reduce allergen levels over time. 4. Regular use of a  high-efficiency vacuum cleaner or a central vacuum can reduce allergen levels. 5. Giving your dog or cat a bath at least once a week can reduce airborne allergen.  Control of Cockroach Allergen  Cockroach allergen has been identified as an important cause of acute attacks of asthma, especially in urban settings.  There are fifty-five species of cockroach that exist in the [place], however only three, the [place], [place] species produce allergen that can affect patients with Asthma.  Allergens can be obtained from fecal particles, egg casings and secretions from cockroaches.    1. Remove food sources. 2. Reduce access to water. 3. Seal access and entry points. 4. Spray runways with 0.5-1% Diazinon or Chlorpyrifos 5. Blow boric acid power under stoves and  refrigerator. 6. Place bait stations (hydramethylnon) at feeding sites.  Allergy Shots   Allergies are the result of a chain reaction that starts in the immune system. Your immune system controls how your body defends itself. For instance, if you have an allergy to pollen, your immune system identifies pollen as an invader or allergen. Your immune system overreacts by producing antibodies called Immunoglobulin E (IgE). These antibodies travel to cells that release chemicals, causing an allergic reaction.  The concept behind allergy immunotherapy, whether it is received in the form of shots or tablets, is that the immune system can be desensitized to specific allergens that trigger allergy symptoms. Although it requires time and patience, the payback can be long-term relief.  How Do Allergy Shots Work?  Allergy shots work much like a vaccine. Your body responds to injected amounts of a particular allergen given in increasing doses, eventually developing a resistance and tolerance to it. Allergy shots can lead to decreased, minimal or no allergy symptoms.  There generally are two phases: build-up and maintenance. Build-up often ranges from three to six months and involves receiving injections with increasing amounts of the allergens. The shots are typically given once or twice a week, though more rapid build-up schedules are sometimes used.  The maintenance phase begins when the most effective dose is reached. This dose is different for each person, depending on how allergic you are and your response to the build-up injections. Once the maintenance dose is reached, there are longer periods between injections, typically two to four weeks.  Occasionally doctors give cortisone-type shots that can temporarily reduce allergy symptoms. These types of shots are different and should not be confused with allergy immunotherapy shots.  Who Can Be Treated with Allergy Shots?  Allergy shots may be a good  treatment approach for people with allergic rhinitis (hay fever), allergic asthma, conjunctivitis (eye allergy) or stinging insect allergy.   Before deciding to begin allergy shots, you should consider:  . The length of allergy season and the severity of your symptoms . Whether medications and/or changes to your environment can control your symptoms . Your desire to avoid long-term medication use . Time: allergy immunotherapy requires a major time commitment . Cost: may vary depending on your insurance coverage  Allergy shots for children age 79 and older are effective and often well tolerated. They might prevent the onset of new allergen sensitivities or the progression to asthma.  Allergy shots are not started on patients who are pregnant but can be continued on patients who become pregnant while receiving them. In some patients with other medical conditions or who take certain common medications, allergy shots may be of risk. It is important to mention other medications you talk to your  allergist.   When Will I Feel Better?  Some may experience decreased allergy symptoms during the build-up phase. For others, it may take as long as 12 months on the maintenance dose. If there is no improvement after a year of maintenance, your allergist will discuss other treatment options with you.  If you aren't responding to allergy shots, it may be because there is not enough dose of the allergen in your vaccine or there are missing allergens that were not identified during your allergy testing. Other reasons could be that there are high levels of the allergen in your environment or major exposure to non-allergic triggers like tobacco smoke.  What Is the Length of Treatment?  Once the maintenance dose is reached, allergy shots are generally continued for three to five years. The decision to stop should be discussed with your allergist at that time. Some people may experience a permanent reduction of  allergy symptoms. Others may relapse and a longer course of allergy shots can be considered.  What Are the Possible Reactions?  The two types of adverse reactions that can occur with allergy shots are local and systemic. Common local reactions include very mild redness and swelling at the injection site, which can happen immediately or several hours after. A systemic reaction, which is less common, affects the entire body or a particular body system. They are usually mild and typically respond quickly to medications. Signs include increased allergy symptoms such as sneezing, a stuffy nose or hives.  Rarely, a serious systemic reaction called anaphylaxis can develop. Symptoms include swelling in the throat, wheezing, a feeling of tightness in the chest, nausea or dizziness. Most serious systemic reactions develop within 30 minutes of allergy shots. This is why it is strongly recommended you wait in your doctor's office for 30 minutes after your injections. Your allergist is trained to watch for reactions, and his or her staff is trained and equipped with the proper medications to identify and treat them.  Who Should Administer Allergy Shots?  The preferred location for receiving shots is your prescribing allergist's office. Injections can sometimes be given at another facility where the physician and staff are trained to recognize and treat reactions, and have received instructions by your prescribing allergist.

## 2018-01-31 NOTE — Progress Notes (Signed)
NEW PATIENT  Date of Service/Encounter:  01/31/18  Referring provider: [name], [name], NP   Assessment:   Seasonal and perennial allergic rhinitis (trees, weeds, grasses, indoor molds, outdoor molds, dust mites)  Adverse social situation - with her husband in a nursing home due to Alzheimer's  Plan/Recommendations:   1. Chronic rhinitis - Testing today showed: trees, weeds, grasses, indoor molds, outdoor molds, dust mites, cat, dog and cockroach (molds were the most reactive) - Copy of test results provided.  - Avoidance measures provided. - Continue with: nasal saline rinses - Start taking: Astelin (azelastine) 2 sprays per nostril 1-2 times daily as needed and we are giving you different antihistamine samples to try - Call us when you decide which antihistamine you would like [place] to send in.  - You can use an extra dose of the antihistamine, if needed, for breakthrough symptoms.  - Consider nasal saline rinses 1-2 times daily to remove allergens from the nasal cavities as well as help with mucous clearance (this is especially helpful to do before the nasal sprays are given) - Consider allergy shots as a means of long-term control. - Allergy shots "re-train" and "reset" the immune system to ignore environmental allergens and decrease the resulting immune response to those allergens (sneezing, itchy watery eyes, runny nose, nasal congestion, etc).    - Allergy shots improve symptoms in 75-85% of patients.  - We can discuss more at the next appointment if the medications are not working for you.  2. Return in about 4 weeks (around 02/28/2018).  Subjective:   Darlene [name] is a 85 y.o. female presenting today for evaluation of  Chief Complaint  Patient presents with  . Allergies  . Sinusitis  . Nasal Congestion  . Sleep Apnea    Darlene [name] has a history of the following: Patient Active Problem List   Diagnosis Date Noted  . Seasonal and perennial allergic rhinitis  01/31/2018  . Infected surgical wound 12/01/2017  . Incontinent of urine 12/01/2017  . Essential hypertension 09/06/2017  . History of CVA (cerebrovascular accident) 09/06/2017  . Hyperlipidemia 09/06/2017  . GAD (generalized anxiety disorder) 09/06/2017  . Acquired hypothyroidism 09/06/2017  . Chronic sinusitis 09/06/2017  . Gastroesophageal reflux disease 09/06/2017    History obtained from: chart review and patient who is very talkative.   Darlene [name] was referred by [name], [name], NP.     Darlene [name] is a 85 y.o. female presenting for an evaluation of chronic rhinitis.  She reports that she moved down here in August 2019.  Since that time, she has had continuous sinus drainage.  She was seen by Dr. Claria Dice around 3 months ago put on an antibiotic which did not seem to do much.  She was also put on a dissolvable tablet for reflux.  She did not feel that reflux was contributing to her symptoms, but she does feel like she has improved somewhat since that time.  Talking does seem to make her symptoms worse.  She does not particularly like medications, but she has been on fluticasone. She also uses nasal saline rinses, which seem to provide some relief.  The symptoms have not changed during the winter months.  She denies any sneezing, but she does have postnasal drip with coughing.  She was tested around 30 years ago and was on shots for period of 2 years.  She is not sure if they helped much, but when her allergist retired she never got around finding another one.  She has since just dealt with the symptoms.  She has no history of asthma.  She denies any drug allergies.  She has never been a smoker. She has sleep apnea and does not use her CPAP.   Otherwise, there is no history of other atopic diseases, including asthma, food allergies, drug allergies, stinging insect allergies, eczema or urticaria. There is no significant infectious history. Vaccinations are up to date.    Past Medical  History: Patient Active Problem List   Diagnosis Date Noted  . Seasonal and perennial allergic rhinitis 01/31/2018  . Infected surgical wound 12/01/2017  . Incontinent of urine 12/01/2017  . Essential hypertension 09/06/2017  . History of CVA (cerebrovascular accident) 09/06/2017  . Hyperlipidemia 09/06/2017  . GAD (generalized anxiety disorder) 09/06/2017  . Acquired hypothyroidism 09/06/2017  . Chronic sinusitis 09/06/2017  . Gastroesophageal reflux disease 09/06/2017    Medication List:  Allergies as of 01/31/2018      Reactions   Dust Mite Extract    Oxycontin [oxycodone Hcl] Nausea And Vomiting   Pollen Extract       Medication List       Accurate as of January 31, 2018  7:31 PM. Always use your most recent med list.        acetaminophen 325 MG tablet Commonly known as:  TYLENOL Take 650 mg by mouth as needed (for sinus headache).   azelastine 0.1 % nasal spray Commonly known as:  ASTELIN Place into both nostrils as needed for rhinitis. 2 sprays in the right nostril and 1 spray in the left nostril   azelastine 0.1 % nasal spray Commonly known as:  ASTELIN Place 2 sprays into both nostrils 2 (two) times daily.   clopidogrel 75 MG tablet Commonly known as:  PLAVIX Take 1 tablet (75 mg total) by mouth daily.   diazepam 5 MG tablet Commonly known as:  VALIUM Take 1/2 tablet as needed for rest.   fluticasone 50 MCG/ACT nasal spray Commonly known as:  FLONASE Place 1 spray into both nostrils daily. 2 sprays in the right nostril and 1 spray in the left nostril   levothyroxine 100 MCG tablet Commonly known as:  SYNTHROID, LEVOTHROID Take 100 mcg by mouth daily before breakfast.   losartan 100 MG tablet Commonly known as:  COZAAR Take 1/2 tablet by mouth daily.   pravastatin 40 MG tablet Commonly known as:  PRAVACHOL Take 40 mg by mouth at bedtime.   sodium chloride 0.65 % Soln nasal spray Commonly known as:  OCEAN Place 1 spray into both nostrils as  needed for congestion.       Birth History: non-contributory  Developmental History: non-contributory.    Past Surgical History: Past Surgical History:  Procedure Laterality Date  . ADENOIDECTOMY    . APPENDECTOMY  1960  . [place] SURGERY  2014  . TONSILLECTOMY  1940     Family History: Family History  Problem Relation Age of Onset  . Lung cancer Mother 2  . Stroke Father   . Allergic rhinitis Sister   . Cancer Maternal Grandfather   . Heart attack Paternal Grandfather   . Psoriasis Daughter   . Rheum arthritis Daughter   . Bipolar disorder Daughter      Social History: Darlene [name] lives at home by herself, but she is married to a husband with Alzheimer's and it is cheaper here.  She is very distraught over the care that her husband is receiving, and is asked my opinion on various long-term care  facilities.  In any case, she and her husband lived in [place] and [place] for around 60 years.  She worked as a Insurance account manager at [employer].  Her husband Joneen Caraway was an Forensic psychologist.  They have a couple of children who live in the [place] still.    Review of Systems: a 14-point review of systems is pertinent for what is mentioned in HPI.  Otherwise, all other systems were negative. Constitutional: negative other than that listed in the HPI Eyes: negative other than that listed in the HPI Ears, nose, mouth, throat, and face: negative other than that listed in the HPI Respiratory: negative other than that listed in the HPI Cardiovascular: negative other than that listed in the HPI Gastrointestinal: negative other than that listed in the HPI Genitourinary: negative other than that listed in the HPI Integument: negative other than that listed in the HPI Hematologic: negative other than that listed in the HPI Musculoskeletal: negative other than that listed in the HPI Neurological: negative other than that listed in the HPI Allergy/Immunologic: negative other  than that listed in the HPI    Objective:   Blood pressure 118/64, pulse 60, temperature 98.1 F (36.7 C), temperature source Oral, resp. rate 16, height 5' 2.5" (1.588 m), weight 157 lb 9.6 oz (71.5 kg), SpO2 96 %. Body mass index is 28.37 kg/m.   Physical Exam:  General: Alert, interactive, in no acute distress. Talkative. Pleasant.  Eyes: No conjunctival injection bilaterally, no discharge on the right, no discharge on the left and no Horner-Trantas dots present. PERRL bilaterally. EOMI without pain. No photophobia.  Ears: Right TM pearly gray with normal light reflex, Left TM pearly gray with normal light reflex, Right TM intact without perforation and Left TM intact without perforation.  Nose/Throat: External nose within normal limits and septum midline. Turbinates edematous and pale with clear discharge. Posterior oropharynx erythematous without cobblestoning in the posterior oropharynx. Tonsils 2+ without exudates.  Tongue without thrush. Neck: Supple without thyromegaly. Trachea midline. Adenopathy: no enlarged lymph nodes appreciated in the anterior cervical, occipital, axillary, epitrochlear, inguinal, or popliteal regions. Lungs: Clear to auscultation without wheezing, rhonchi or rales. No increased work of breathing. CV: Normal S1/S2. No murmurs. Capillary refill <2 seconds.  Abdomen: Nondistended, nontender. No guarding or rebound tenderness. Bowel sounds present in all fields and hypoactive  Skin: Warm and dry, without lesions or rashes. Extremities:  No clubbing, cyanosis or edema. Neuro:   Grossly intact. No focal deficits appreciated. Responsive to questions.  Diagnostic studies:   Allergy Studies:   Airborne Adult Perc - 08-Feb-2018 1421    Time Antigen Placed  1430    Allergen Manufacturer  Lavella Hammock    Location  Back    Number of Test  59    Panel 1  Select    1. Control-Buffer 50% Glycerol  Negative    2. Control-Histamine 1 mg/ml  2+    3. Albumin saline   Negative    4. Wilbur  Negative    5. [place]  Negative    6. Johnson  2+    7. Page Blue  Negative    8. Meadow Fescue  Negative    9. Perennial Rye  Negative    10. Sweet Vernal  Negative    11. Timothy  2+    12. Cocklebur  Negative    13. Burweed Marshelder  Negative    14. Ragweed, short  Negative    15. Ragweed, Giant  Negative  16. Plantain,  English  Negative    17. Lamb's Quarters  Negative    18. Sheep Sorrell  Negative    19. Rough Pigweed  2+    20. Marsh Elder, Rough  2+    21. Mugwort, Common  Negative    23. Birch mix  Negative    24. Beech American  Negative    25. Box, Elder  Negative    26. Cedar, red  Negative    27. Cottonwood, [place]  Negative    28. Elm mix  Negative    29. Hickory mix  Negative    30. Maple mix  Negative    31. Oak, [place] mix  Negative    32. Pecan Pollen  Negative    33. Pine mix  Negative    34. Sycamore Eastern  Negative    35. Monett, Black Pollen  Negative    36. Alternaria alternata  Negative    37. Cladosporium Herbarum  Negative    38. Aspergillus mix  Negative    39. Penicillium mix  Negative    40. Bipolaris sorokiniana (Helminthosporium)  Negative    41. Drechslera spicifera (Curvularia)  Negative    42. Mucor plumbeus  Negative    43. Fusarium moniliforme  Negative    44. Aureobasidium pullulans (pullulara)  Negative    45. Rhizopus oryzae  Negative    46. Botrytis cinera  Negative    47. Epicoccum nigrum  Negative    48. Phoma betae  Negative    49. Candida Albicans  Negative    50. Trichophyton mentagrophytes  Negative    51. Mite, D Farinae  5,000 AU/ml  Negative    52. Mite, D Pteronyssinus  5,000 AU/ml  Negative    53. Cat Hair 10,000 BAU/ml  Negative    54.  Dog Epithelia  Negative    55. Mixed Feathers  Negative    56. Horse Epithelia  Negative    57. Cockroach, German  Negative    58. Mouse  Negative    59. Tobacco Leaf  Negative     Intradermal - 01/31/18 1506    Time Antigen Placed  1530     Allergen Manufacturer  Lavella Hammock    Location  Arm    Number of Test  12    Intradermal  Select    Control  Negative    [place]  4+    Ragweed mix  1+    Tree mix  1+    Mold 1  3+    Mold 2  3+    Mold 3  3+    Mold 4  4+    Cat  2+    Dog  2+    Cockroach  1+    Mite mix  2+        Allergy testing results were read and interpreted by myself, documented by clinical staff.       Salvatore Marvel, MD [facility]

## 2018-02-23 ENCOUNTER — Non-Acute Institutional Stay: Payer: Medicare Other | Admitting: Nurse Practitioner

## 2018-02-23 ENCOUNTER — Encounter: Payer: Self-pay | Admitting: Nurse Practitioner

## 2018-02-23 VITALS — BP 130/62 | HR 76 | Temp 98.3°F | Resp 20 | Ht 63.0 in | Wt 158.0 lb

## 2018-02-23 DIAGNOSIS — I10 Essential (primary) hypertension: Secondary | ICD-10-CM | POA: Diagnosis not present

## 2018-02-23 DIAGNOSIS — J329 Chronic sinusitis, unspecified: Secondary | ICD-10-CM | POA: Diagnosis not present

## 2018-02-23 DIAGNOSIS — L570 Actinic keratosis: Secondary | ICD-10-CM

## 2018-02-23 DIAGNOSIS — R635 Abnormal weight gain: Secondary | ICD-10-CM | POA: Diagnosis not present

## 2018-02-23 DIAGNOSIS — Z8673 Personal history of transient ischemic attack (TIA), and cerebral infarction without residual deficits: Secondary | ICD-10-CM | POA: Diagnosis not present

## 2018-02-23 NOTE — Assessment & Plan Note (Signed)
Continue Plavix 75mg  qd, Pravastatin 40mg  qd.

## 2018-02-23 NOTE — Assessment & Plan Note (Signed)
dermatology

## 2018-02-23 NOTE — Progress Notes (Signed)
Location:   clinic [place]   Place of Service:  Clinic (12) Provider: Marlana Latus NP  Code Status: DNR Goals of Care: No flowsheet data found.   Chief Complaint  Patient presents with  . Acute Visit    ? skin cancer (R) knee    HPI: Patient is a 85 y.o. female seen today for medical management of chronic diseases.     The patient has history of skin cancer, wight gain about #10Ibs in the past 6 months, #148Ibs 09/06/17, #157 ibs 01/31/18.   The patient has hx of HTN, blood pressure is controlled on Losartan 100mg  qd. Hx of CVA, on Plavix 75mg  qd, Pravastatin 40mg  qd.    Past Medical History:  Diagnosis Date  . Hyperlipidemia   . Hypertension   . Hypothyroidism   . Sleep apnea   . Stroke [facility] 2008    Past Surgical History:  Procedure Laterality Date  . ADENOIDECTOMY    . APPENDECTOMY  1960  . [place] SURGERY  2014  . TONSILLECTOMY  1940    Allergies  Allergen Reactions  . Dust Mite Extract   . Oxycontin [Oxycodone Hcl] Nausea And Vomiting  . Pollen Extract     Allergies as of 02/23/2018      Reactions   Dust Mite Extract    Oxycontin [oxycodone Hcl] Nausea And Vomiting   Pollen Extract       Medication List       Accurate as of February 23, 2018 11:59 PM. Always use your most recent med list.        acetaminophen 325 MG tablet Commonly known as:  TYLENOL Take 650 mg by mouth as needed (for sinus headache).   azelastine 0.1 % nasal spray Commonly known as:  ASTELIN Place into both nostrils as needed for rhinitis. 2 sprays in the right nostril and 1 spray in the left nostril   azelastine 0.1 % nasal spray Commonly known as:  ASTELIN Place 2 sprays into both nostrils 2 (two) times daily.   clopidogrel 75 MG tablet Commonly known as:  PLAVIX Take 1 tablet (75 mg total) by mouth daily.   diazepam 5 MG tablet Commonly known as:  VALIUM Take 1/2 tablet as needed for rest.   fluticasone 50 MCG/ACT nasal spray Commonly known as:  FLONASE Place 1  spray into both nostrils daily. 2 sprays in the right nostril and 1 spray in the left nostril   levothyroxine 100 MCG tablet Commonly known as:  SYNTHROID, LEVOTHROID Take 100 mcg by mouth daily before breakfast.   losartan 100 MG tablet Commonly known as:  COZAAR Take 1/2 tablet by mouth daily.   pravastatin 40 MG tablet Commonly known as:  PRAVACHOL Take 40 mg by mouth at bedtime.   sodium chloride 0.65 % Soln nasal spray Commonly known as:  OCEAN Place 1 spray into both nostrils as needed for congestion.       Review of Systems:  Review of Systems  Constitutional: Negative for activity change, appetite change, chills, diaphoresis, fatigue and fever.  HENT: Positive for hearing loss. Negative for voice change.   Eyes: Positive for visual disturbance.       Left peripheral vision filed loss from pervious CVA  Respiratory: Positive for cough. Negative for shortness of breath and wheezing.        Allergy, ENT, sometime sore throat from coughing.   Cardiovascular: Negative for chest pain, palpitations and leg swelling.  Gastrointestinal: Negative for abdominal distention, constipation, diarrhea,  nausea and vomiting.  Genitourinary: Negative for difficulty urinating, dysuria and urgency.  Musculoskeletal: Positive for arthralgias and gait problem.       Mainly in knees  Skin: Negative for color change and pallor.  Neurological: Negative for dizziness, speech difficulty and headaches.  Psychiatric/Behavioral: Negative for agitation, behavioral problems, hallucinations and sleep disturbance. The patient is not nervous/anxious.     Health Maintenance  Topic Date Due  . DEXA SCAN  07/17/2000  . PNA vac Low Risk Adult (2 of 2 - PCV13) 01/18/2006  . INFLUENZA VACCINE  08/18/2017  . TETANUS/TDAP  11/26/2018    Physical Exam: Vitals:   02/23/18 1515  BP: 130/62  Pulse: 76  Resp: 20  Temp: 98.3 F (36.8 C)  TempSrc: Oral  SpO2: 96%  Weight: 158 lb (71.7 kg)  Height: 5'  3" (1.6 m)   Body mass index is 27.99 kg/m. Physical Exam Constitutional:      Appearance: Normal appearance.  HENT:     Head: Normocephalic and atraumatic.     Nose: Nose normal.     Mouth/Throat:     Mouth: Mucous membranes are moist.  Eyes:     Extraocular Movements: Extraocular movements intact.     Conjunctiva/sclera: Conjunctivae normal.     Pupils: Pupils are equal, round, and reactive to light.     Comments: Lateral left peripheral vision loss from previous CVA  Neck:     Musculoskeletal: Normal range of motion and neck supple.  Cardiovascular:     Rate and Rhythm: Normal rate and regular rhythm.     Heart sounds: No murmur.  Pulmonary:     Effort: Pulmonary effort is normal.     Breath sounds: No wheezing, rhonchi or rales.  Abdominal:     General: There is no distension.     Palpations: Abdomen is soft.     Tenderness: There is no abdominal tenderness. There is no guarding.  Musculoskeletal:     Right lower leg: No edema.     Left lower leg: No edema.  Skin:    General: Skin is warm and dry.     Comments: Medial skin surgical scar is scabbed over. AK, SK many of them.   Neurological:     General: No focal deficit present.     Mental Status: She is alert and oriented to person, place, and time. Mental status is at baseline.     Motor: No weakness.     Coordination: Coordination normal.  Psychiatric:        Mood and Affect: Mood normal.        Behavior: Behavior normal.        Thought Content: Thought content normal.        Judgment: Judgment normal.     Labs reviewed: Basic Metabolic Panel: Recent Labs    04/15/17 12/13/17 0745  NA  --  144  K  --  4.2  CL 105 111*  CO2  --  26  GLUCOSE  --  114*  BUN  --  15  CREATININE  --  0.79  CALCIUM 10.1 9.5  TSH  --  1.85   Liver Function Tests: Recent Labs    12/13/17 0745  AST 14  ALT 16  BILITOT 0.3  PROT 6.1   No results for input(s): LIPASE, AMYLASE in the last 8760 hours. No results for  input(s): AMMONIA in the last 8760 hours. CBC: Recent Labs    06/14/17 12/13/17 0745  WBC 7.5 5.4  NEUTROABS  --  2,646  HGB 14.5 13.8  HCT 45 41.0  MCV  --  87.0  PLT 280 256   Lipid Panel: Recent Labs    12/13/17 0745  CHOL 149  HDL 46*  LDLCALC 82  TRIG 110  CHOLHDL 3.2   No results found for: HGBA1C  Procedures since last visit: No results found.  Assessment/Plan  Essential hypertension Blood pressure is controlled, continue Losartan 100mg  qd.   History of CVA (cerebrovascular accident) Continue Plavix 75mg  qd, Pravastatin 40mg  qd.   Chronic sinusitis F/u ENT  Abnormal weight gain Exercise and diet, observe.   Actinic keratoses dermatology   Labs/tests ordered:    Next appt:  04/06/2018

## 2018-02-23 NOTE — Assessment & Plan Note (Signed)
F/u ENT

## 2018-02-23 NOTE — Assessment & Plan Note (Signed)
Blood pressure is controlled, continue Losartan 100mg qd.  

## 2018-02-23 NOTE — Assessment & Plan Note (Signed)
Exercise and diet, observe.

## 2018-02-28 ENCOUNTER — Ambulatory Visit: Payer: Medicare Other | Admitting: Allergy & Immunology

## 2018-02-28 ENCOUNTER — Encounter: Payer: Self-pay | Admitting: Allergy & Immunology

## 2018-02-28 VITALS — BP 122/70 | HR 72 | Resp 18

## 2018-02-28 DIAGNOSIS — J302 Other seasonal allergic rhinitis: Secondary | ICD-10-CM | POA: Diagnosis not present

## 2018-02-28 DIAGNOSIS — J3089 Other allergic rhinitis: Secondary | ICD-10-CM

## 2018-02-28 DIAGNOSIS — J4530 Mild persistent asthma, uncomplicated: Secondary | ICD-10-CM

## 2018-02-28 MED ORDER — CETIRIZINE HCL 10 MG PO TABS: 10.0000 mg | ORAL_TABLET | Freq: Every day | ORAL | 11 refills | Status: DC

## 2018-02-28 MED ORDER — ALBUTEROL SULFATE HFA 108 (90 BASE) MCG/ACT IN AERS: 2.0000 | INHALATION_SPRAY | Freq: Four times a day (QID) | RESPIRATORY_TRACT | 2 refills | Status: DC | PRN

## 2018-02-28 MED ORDER — BECLOMETHASONE DIPROP HFA 80 MCG/ACT IN AERB: 1.0000 | INHALATION_SPRAY | Freq: Two times a day (BID) | RESPIRATORY_TRACT | 6 refills | Status: DC

## 2018-02-28 NOTE — Progress Notes (Signed)
FOLLOW UP  Date of Service/Encounter:  02/28/18   Assessment:   Seasonal and perennial allergic rhinitis (trees, weeds, grasses, indoor molds, outdoor molds, dust mites)  Persistent asthma - possible  Adverse social situation - with her husband in a nursing home due to Alzheimer's  Plan/Recommendations:   1. Chronic rhinitis (trees, weeds, grasses, indoor molds, outdoor molds, dust mites, cat, dog and cockroach) - We will continue you on the Zyrtec since this seems to be working better.  - We will send in a prescription for this. - Continue with: nasal saline rinses and Zyrtec (cetirizine) 10mg  daily and Astelin (azelastine) 2 sprays per nostril 1-2 times daily as needed  2. Possible asthma - Lung testing looks great today and I am unsure if you have asthma, but since you have the Qvar we will try using it on a daily basis to help decrease mucous production and inflammation in the lungs.   - Daily controller medication(s): Qvar 49mcg Redihaler 1 puff twice daily - Prior to physical activity: albuterol 2 puffs 10-15 minutes before physical activity. - Rescue medications: albuterol 4 puffs every 4-6 hours as needed - Asthma control goals:  * Full participation in all desired activities (may need albuterol before activity) * Albuterol use two time or less a week on average (not counting use with activity) * Cough interfering with sleep two time or less a month * Oral steroids no more than once a year * No hospitalizations  3. Return in about 6 weeks (around 04/11/2018).  Subjective:   Darlene [name] is a 85 y.o. female presenting today for follow up of  Chief Complaint  Patient presents with  . Allergic Rhinitis     discussing injections? states that she is never doing okay, her sinus are always alwful, drainage all the time.     Darlene [name] has a history of the following: Patient Active Problem List   Diagnosis Date Noted  . Abnormal weight gain 02/23/2018  . Actinic  keratoses 02/23/2018  . Seasonal and perennial allergic rhinitis 01/31/2018  . Incontinent of urine 12/01/2017  . Essential hypertension 09/06/2017  . History of CVA (cerebrovascular accident) 09/06/2017  . Hyperlipidemia 09/06/2017  . GAD (generalized anxiety disorder) 09/06/2017  . Acquired hypothyroidism 09/06/2017  . Chronic sinusitis 09/06/2017  . Gastroesophageal reflux disease 09/06/2017    History obtained from: chart review and patient.  Darlene [name] Primary Care Provider is [name], [name], NP.     Darlene [name] is a 85 y.o. female presenting for a follow up visit. She was last seen in January 2020 to establish care. At that time, she had testing that was positive to trees, weeds, grasses, indoor and outdoor molds, dust ites, cat, dog, and cockroach. We continue nasal saline rinses and started her on Astelin. We did discuss the use of allergen immunotherapy but we wanted to give the medications time to work instead.   Since the last visit, she has mostly done well. She had Astelin from her PA and she feels that it works better than her Flonase. She also reports that she has a burning sensation when she swallows. Evidently she was started on a probiotic by Dr. Ernesto Rutherford.    She reports today that felt that she has done better with the Zyrtec. This seems to be working better than her antihistamines. She would like a prescription for this.   She does have a Qvar as well which she "has never been desperate enough to use". She has never been  diagnosed with asthma but she tells me that she she uses it when she wheezes. She tells me that she "wheezes when she coughs". It is unclear whether this a wheeze heard through her nose or her lungs. She was given Qvar as needed. She has had the same inhaler for more than 6 months. She does not use it often at all. It is not entirely clear why this was ever prescribed. She tells me that a PA prescribed it back in [place]. I did explain the difference  between controllers and rescue medications.   Otherwise, there have been no changes to her past medical history, surgical history, family history, or social history.    Review of Systems  Constitutional: Negative.  Negative for fever, malaise/fatigue and weight loss.  HENT: Negative.  Negative for congestion, ear discharge and ear pain.   Eyes: Negative for pain, discharge and redness.  Respiratory: Positive for wheezing. Negative for cough, sputum production and shortness of breath.   Cardiovascular: Negative.  Negative for chest pain and palpitations.  Gastrointestinal: Negative for abdominal pain and heartburn.  Skin: Negative.  Negative for itching and rash.  Neurological: Negative for dizziness and headaches.  Endo/Heme/Allergies: Negative for environmental allergies. Does not bruise/bleed easily.       Objective:   Blood pressure 122/70, pulse 72, resp. rate 18, SpO2 95 %. There is no height or weight on file to calculate BMI.   Physical Exam:  Physical Exam  Constitutional: She appears well-developed.  HENT:  Head: Normocephalic and atraumatic.  Right Ear: Tympanic membrane, external ear and ear canal normal. No drainage, swelling or tenderness. Tympanic membrane is not injected, not scarred, not erythematous, not retracted and not bulging.  Left Ear: Tympanic membrane, external ear and ear canal normal. No drainage, swelling or tenderness. Tympanic membrane is not injected, not scarred, not erythematous, not retracted and not bulging.  Nose: No mucosal edema, rhinorrhea, nasal deformity or septal deviation. No epistaxis. Right sinus exhibits no maxillary sinus tenderness and no frontal sinus tenderness. Left sinus exhibits no maxillary sinus tenderness and no frontal sinus tenderness.  Mouth/Throat: Uvula is midline and oropharynx is clear and moist. Mucous membranes are not pale and not dry.  Eyes: Pupils are equal, round, and reactive to light. Conjunctivae and EOM are  normal. Right eye exhibits no chemosis and no discharge. Left eye exhibits no chemosis and no discharge. Right conjunctiva is not injected. Left conjunctiva is not injected.  Cardiovascular: Normal rate, regular rhythm and normal heart sounds.  Respiratory: Effort normal and breath sounds normal. No accessory muscle usage. No tachypnea. No respiratory distress. She has no wheezes. She has no rhonchi. She has no rales. She exhibits no tenderness.  Lymphadenopathy:       Head (right side): No submandibular, no tonsillar and no occipital adenopathy present.       Head (left side): No submandibular, no tonsillar and no occipital adenopathy present.    She has no cervical adenopathy.  Neurological: She is alert.  Skin: No abrasion and no petechiae noted. No erythema. No pallor.  No urticaria or eczematous lesions noted.  Psychiatric: She has a normal mood and affect.     Diagnostic studies:   Spirometry: results normal (FEV1: 1.63/98%, FVC: 1.83/81%, FEV1/FVC: 74%).    Spirometry consistent with normal pattern.   Allergy Studies: none      Salvatore Marvel, MD  [facility]

## 2018-02-28 NOTE — Patient Instructions (Addendum)
1. Chronic rhinitis (trees, weeds, grasses, indoor molds, outdoor molds, dust mites, cat, dog and cockroach) - We will continue you on the Zyrtec since this seems to be working better.  - We will send in a prescription for this. - Continue with: nasal saline rinses and Zyrtec (cetirizine) 10mg  daily and Astelin (azelastine) 2 sprays per nostril 1-2 times daily as needed  2. Possible asthma - Lung testing looks great today and I am unsure if you have asthma, but since you have the Qvar we will try using it on a daily basis to help decrease mucous production and inflammation in the lungs.   - Daily controller medication(s): Qvar 71mcg Redihaler 1 puff twice daily - Prior to physical activity: albuterol 2 puffs 10-15 minutes before physical activity. - Rescue medications: albuterol 4 puffs every 4-6 hours as needed - Asthma control goals:  * Full participation in all desired activities (may need albuterol before activity) * Albuterol use two time or less a week on average (not counting use with activity) * Cough interfering with sleep two time or less a month * Oral steroids no more than once a year * No hospitalizations  3. Return in about 6 weeks (around 04/11/2018).   Please inform us of any Emergency Department visits, hospitalizations, or changes in symptoms. Call us before going to the ED for breathing or allergy symptoms since we might be able to fit you in for a sick visit. Feel free to contact us anytime with any questions, problems, or concerns.  It was a pleasure to see you again today!  Websites that have reliable patient information: 1. American Academy of Asthma, Allergy, and Immunology: www.aaaai.org 2. Food Allergy Research and Education (FARE): foodallergy.org 3. Mothers of Asthmatics: http://www.asthmacommunitynetwork.org 4. American College of Allergy, Asthma, and Immunology: MonthlyElectricBill.co.uk   Make sure you are registered to vote! If you have moved or changed any of your  contact information, you will need to get this updated before voting!    Voter ID laws are POSSIBLY going into effect for the General Election in November 2020! Be prepared! Check out http://levine.[email or web address] for more details.

## 2018-02-28 NOTE — Addendum Note (Signed)
Addended by: Valere Dross on: 02/28/2018 06:05 PM   Modules accepted: Orders

## 2018-03-22 ENCOUNTER — Telehealth: Payer: Self-pay | Admitting: *Deleted

## 2018-03-22 DIAGNOSIS — Z129 Encounter for screening for malignant neoplasm, site unspecified: Secondary | ICD-10-CM

## 2018-03-22 DIAGNOSIS — K219 Gastro-esophageal reflux disease without esophagitis: Secondary | ICD-10-CM

## 2018-03-22 DIAGNOSIS — I10 Essential (primary) hypertension: Secondary | ICD-10-CM

## 2018-03-22 NOTE — Telephone Encounter (Signed)
Order placed

## 2018-03-22 NOTE — Telephone Encounter (Signed)
Patient called and stated that she is due for her Colonoscopy and Endoscopy. Stated that she would like for you to place a referral for her to have this done. Please Advise.

## 2018-03-22 NOTE — Telephone Encounter (Signed)
That's fine, can you put referral in?

## 2018-04-06 ENCOUNTER — Encounter: Payer: Self-pay | Admitting: Nurse Practitioner

## 2018-04-06 ENCOUNTER — Encounter: Payer: Medicare Other | Admitting: Nurse Practitioner

## 2018-04-06 ENCOUNTER — Ambulatory Visit: Payer: Medicare Other | Admitting: Allergy & Immunology

## 2018-04-06 ENCOUNTER — Other Ambulatory Visit: Payer: Self-pay

## 2018-04-06 DIAGNOSIS — J329 Chronic sinusitis, unspecified: Secondary | ICD-10-CM

## 2018-04-06 DIAGNOSIS — J45909 Unspecified asthma, uncomplicated: Secondary | ICD-10-CM

## 2018-04-06 DIAGNOSIS — E039 Hypothyroidism, unspecified: Secondary | ICD-10-CM

## 2018-04-06 DIAGNOSIS — I10 Essential (primary) hypertension: Secondary | ICD-10-CM

## 2018-04-06 DIAGNOSIS — F411 Generalized anxiety disorder: Secondary | ICD-10-CM

## 2018-04-06 DIAGNOSIS — Z23 Encounter for immunization: Secondary | ICD-10-CM

## 2018-04-06 HISTORY — DX: Unspecified asthma, uncomplicated: J45.909

## 2018-04-06 NOTE — Assessment & Plan Note (Signed)
Stable, continue Flonase daily, Zyrtec 10mg  qd, Astelin bid.

## 2018-04-06 NOTE — Assessment & Plan Note (Signed)
Anxiety, controlled, continue prn Diazepam 2.5mg  hs to help rest at night.

## 2018-04-06 NOTE — Assessment & Plan Note (Signed)
blood pressure is controlled, continue Losartan 100mg  qd.

## 2018-04-06 NOTE — Progress Notes (Signed)
Location:      Place of Service:    Provider: Marlana Latus NP  Code Status: DNR Goals of Care: No flowsheet data found.   No chief complaint on file.   HPI: Patient is a 85 y.o. female seen today for medical management of chronic diseases.    The patient has history of hypothyroidism, on Levothyroxine 123mcg qd, last TSH 1.85 12/13/17. HTN blood pressure is controlled on Losartan 100mg  qd. Chronic sinusitis, stable on Flonase daily, Zyrtec 10mg  qd, Astelin bid, hx of asthma, on  Beclomethasone inhaler bid, prn Proventil q6h prn. Anxiety, prn Diazepam 2.5mg  hs to help rest at night.    Past Medical History:  Diagnosis Date  . Asthma 04/06/2018  . Hyperlipidemia   . Hypertension   . Hypothyroidism   . Sleep apnea   . Stroke [facility]) 2008    Past Surgical History:  Procedure Laterality Date  . ADENOIDECTOMY    . APPENDECTOMY  1960  . [place] SURGERY  2014  . TONSILLECTOMY  1940    Allergies  Allergen Reactions  . Dust Mite Extract   . Oxycontin [Oxycodone Hcl] Nausea And Vomiting  . Pollen Extract     Allergies as of 04/06/2018      Reactions   Dust Mite Extract    Oxycontin [oxycodone Hcl] Nausea And Vomiting   Pollen Extract       Medication List       Accurate as of April 06, 2018 11:27 AM. Always use your most recent med list.        acetaminophen 325 MG tablet Commonly known as:  TYLENOL Take 650 mg by mouth as needed (for sinus headache).   albuterol 108 (90 Base) MCG/ACT inhaler Commonly known as:  PROVENTIL HFA;VENTOLIN HFA Inhale 2 puffs into the lungs every 6 (six) hours as needed for wheezing or shortness of breath.   azelastine 0.1 % nasal spray Commonly known as:  ASTELIN Place into both nostrils as needed for rhinitis. 2 sprays in the right nostril and 1 spray in the left nostril   azelastine 0.1 % nasal spray Commonly known as:  ASTELIN Place 2 sprays into both nostrils 2 (two) times daily.   beclomethasone 80 MCG/ACT inhaler  Commonly known as:  Qvar RediHaler Inhale 1 puff into the lungs 2 (two) times daily.   cetirizine 10 MG tablet Commonly known as:  ZyrTEC Allergy Take 1 tablet (10 mg total) by mouth daily.   clopidogrel 75 MG tablet Commonly known as:  PLAVIX Take 1 tablet (75 mg total) by mouth daily.   diazepam 5 MG tablet Commonly known as:  VALIUM Take 1/2 tablet as needed for rest.   fluticasone 50 MCG/ACT nasal spray Commonly known as:  FLONASE Place 1 spray into both nostrils daily. 2 sprays in the right nostril and 1 spray in the left nostril   levothyroxine 100 MCG tablet Commonly known as:  SYNTHROID, LEVOTHROID Take 100 mcg by mouth daily before breakfast.   losartan 100 MG tablet Commonly known as:  COZAAR Take 1/2 tablet by mouth daily.   pravastatin 40 MG tablet Commonly known as:  PRAVACHOL Take 40 mg by mouth at bedtime.   sodium chloride 0.65 % Soln nasal spray Commonly known as:  OCEAN Place 1 spray into both nostrils as needed for congestion.       Review of Systems:  Review of Systems  Constitutional: Negative for activity change, appetite change, chills, diaphoresis, fatigue, fever and unexpected weight change.  HENT: Positive for hearing loss. Negative for voice change.   Eyes: Positive for visual disturbance.       Left eye peripheral visual field deficit from previous CVA  Respiratory: Positive for cough. Negative for shortness of breath and wheezing.   Cardiovascular: Negative for chest pain, palpitations and leg swelling.  Gastrointestinal: Negative for abdominal distention, abdominal pain, constipation, diarrhea, nausea and vomiting.  Genitourinary: Negative for difficulty urinating, dysuria and urgency.       Incontinent of urine.   Musculoskeletal: Positive for arthralgias.       Knee pain  Skin: Negative for color change and pallor.  Neurological: Negative for dizziness, facial asymmetry, speech difficulty, weakness, numbness and headaches.   Psychiatric/Behavioral: Positive for sleep disturbance. Negative for agitation, behavioral problems and hallucinations. The patient is nervous/anxious.     Health Maintenance  Topic Date Due  . DEXA SCAN  07/17/2000  . PNA vac Low Risk Adult (2 of 2 - PCV13) 01/18/2006  . INFLUENZA VACCINE  08/18/2017  . TETANUS/TDAP  11/26/2018    Physical Exam: There were no vitals filed for this visit. There is no height or weight on file to calculate BMI. Physical Exam Constitutional:      General: She is not in acute distress.    Appearance: Normal appearance. She is not ill-appearing, toxic-appearing or diaphoretic.     Comments: Over weight  HENT:     Head: Normocephalic and atraumatic.     Nose: Congestion and rhinorrhea present.     Mouth/Throat:     Mouth: Mucous membranes are moist.  Eyes:     Extraocular Movements: Extraocular movements intact.     Conjunctiva/sclera: Conjunctivae normal.     Pupils: Pupils are equal, round, and reactive to light.     Comments: Lateral left eye visual field deficit from previous CVA  Cardiovascular:     Rate and Rhythm: Normal rate and regular rhythm.     Heart sounds: No murmur.  Pulmonary:     Effort: Pulmonary effort is normal.     Breath sounds: No wheezing, rhonchi or rales.  Abdominal:     General: There is no distension.     Palpations: Abdomen is soft.     Tenderness: There is no abdominal tenderness. There is no guarding or rebound.  Musculoskeletal:     Right lower leg: No edema.     Left lower leg: No edema.     Comments: Chronic knee pain, livable.   Skin:    General: Skin is warm and dry.  Neurological:     General: No focal deficit present.     Mental Status: She is alert and oriented to person, place, and time. Mental status is at baseline.     Cranial Nerves: No cranial nerve deficit.     Motor: No weakness.     Coordination: Coordination normal.     Gait: Gait abnormal.  Psychiatric:        Mood and Affect: Mood  normal.        Behavior: Behavior normal.        Thought Content: Thought content normal.        Judgment: Judgment normal.     Labs reviewed: Basic Metabolic Panel: Recent Labs    04/15/17 12/13/17 0745  NA  --  144  K  --  4.2  CL 105 111*  CO2  --  26  GLUCOSE  --  114*  BUN  --  15  CREATININE  --  0.79  CALCIUM 10.1 9.5  TSH  --  1.85   Liver Function Tests: Recent Labs    12/13/17 0745  AST 14  ALT 16  BILITOT 0.3  PROT 6.1   No results for input(s): LIPASE, AMYLASE in the last 8760 hours. No results for input(s): AMMONIA in the last 8760 hours. CBC: Recent Labs    06/14/17 12/13/17 0745  WBC 7.5 5.4  NEUTROABS  --  2,646  HGB 14.5 13.8  HCT 45 41.0  MCV  --  87.0  PLT 280 256   Lipid Panel: Recent Labs    12/13/17 0745  CHOL 149  HDL 46*  LDLCALC 82  TRIG 110  CHOLHDL 3.2   No results found for: HGBA1C  Procedures since last visit: No results found.  Assessment/Plan  Essential hypertension blood pressure is controlled, continue Losartan 100mg  qd.   Chronic sinusitis Stable, continue Flonase daily, Zyrtec 10mg  qd, Astelin bid.  Acquired hypothyroidism Continue  Levothyroxine 158mcg qd, last TSH 1.85 12/13/17.  GAD (generalized anxiety disorder) Anxiety, controlled, continue prn Diazepam 2.5mg  hs to help rest at night.    Asthma hx of asthma, on  Beclomethasone inhaler bid, prn Proventil q6h prn.   Labs/tests ordered:  None  Next appt:  4 months  This encounter was created in error - please disregard. This encounter was created in error - please disregard.

## 2018-04-06 NOTE — Assessment & Plan Note (Signed)
hx of asthma, on  Beclomethasone inhaler bid, prn Proventil q6h prn.

## 2018-04-06 NOTE — Assessment & Plan Note (Signed)
Continue  Levothyroxine 139mcg qd, last TSH 1.85 12/13/17.

## 2018-04-11 ENCOUNTER — Ambulatory Visit: Payer: Medicare Other | Admitting: Allergy & Immunology

## 2018-05-02 ENCOUNTER — Other Ambulatory Visit: Payer: Self-pay | Admitting: *Deleted

## 2018-05-02 MED ORDER — DIAZEPAM 5 MG PO TABS: ORAL_TABLET | ORAL | 1 refills | Status: DC

## 2018-05-02 NOTE — Telephone Encounter (Signed)
Patient requested refill. Staying with her daughter during the [place] pandemic. Phoned to pharmacy.

## 2018-05-05 MED ORDER — DIAZEPAM 5 MG PO TABS: 5.0000 mg | ORAL_TABLET | Freq: Every day | ORAL | 1 refills | Status: DC

## 2018-05-05 NOTE — Telephone Encounter (Signed)
Please advise, per pharmacy frequency can not be " as needed" how should pt take medication? Please send to pharmacy.

## 2018-05-05 NOTE — Addendum Note (Signed)
Addended by: Despina Hidden on: 05/05/2018 09:00 AM   Modules accepted: Orders

## 2018-05-08 ENCOUNTER — Other Ambulatory Visit: Payer: Self-pay | Admitting: Nurse Practitioner

## 2018-05-08 ENCOUNTER — Other Ambulatory Visit: Payer: Self-pay | Admitting: *Deleted

## 2018-05-08 MED ORDER — DIAZEPAM 5 MG PO TABS: 5.0000 mg | ORAL_TABLET | Freq: Every day | ORAL | 1 refills | Status: DC

## 2018-05-08 NOTE — Telephone Encounter (Signed)
Patient called and stated that the pharmacy will not give her the Valium Rx.   I called Pharmacy and spoke with Prentice Docker and she stated that they did not received the Rx sent on Friday and the Rx cannot say take as needed.   Resent Rx to [facility] for approval.

## 2018-05-17 ENCOUNTER — Telehealth: Payer: Self-pay

## 2018-05-17 NOTE — Telephone Encounter (Signed)
That is fantastic - glad to hear this. Thanks for the update!   Salvatore Marvel, MD [facility]

## 2018-05-17 NOTE — Telephone Encounter (Signed)
Call to patient reschedule her appointment.  Pt states that the medication that she was given works just fine and does not need to come back anytime soon.  Pt is currently in [place] taking care of her husband who has alzheimer's and does not see herself coming back for a few weeks.  Pt states that she will call us when she is ready to come back.  She wanted to let you know that her medication is working.

## 2018-06-13 ENCOUNTER — Other Ambulatory Visit: Payer: Self-pay | Admitting: *Deleted

## 2018-06-13 NOTE — Telephone Encounter (Signed)
Patient called and stated that her pharmacy for the summer will be [facility]. Her husband is a resident down there. She will call when refill is needed.

## 2018-07-18 ENCOUNTER — Telehealth: Payer: Self-pay | Admitting: *Deleted

## 2018-07-18 NOTE — Telephone Encounter (Signed)
Patient called and left message on Clinical Intake stating that she needed a medication sent to a pharmacy in [facility] because she is out of town and out of medication.   Called patient and LMOM to return call.

## 2018-07-19 MED ORDER — DIAZEPAM 5 MG PO TABS: 5.0000 mg | ORAL_TABLET | Freq: Every day | ORAL | 0 refills | Status: DC

## 2018-07-19 NOTE — Telephone Encounter (Signed)
Patient requested Diazepam to be sent to pharmacy. Phoned in.

## 2018-08-03 ENCOUNTER — Telehealth: Payer: Self-pay

## 2018-08-03 NOTE — Telephone Encounter (Signed)
Spoke with the patient. She currently resides in [facility],  requested me to call ENT PA [employer] [facility] regarding her sinusitis and elevated Bp. [employer] recommended possible workup for Cushing's disease in the future. [employer] plan to call the patient to discuss plan of workup.

## 2018-08-03 NOTE — Telephone Encounter (Signed)
Resident called requesting to speak with [name] [name] when asked what the call was about.resident stated she had been staying at a hotel in [place] and was seeing a provider in [place] for what she saids is a Sinus Infection and High blood pressure and is afraid to drive back to [place] due to these problems.  Patient states the Dr in [place] would like to speak with [name] [name] however the resident wants to speak with her 1st. The resident can be reached at [phone]  Provider please advise  --

## 2018-08-08 ENCOUNTER — Telehealth: Payer: Self-pay

## 2018-08-08 NOTE — Telephone Encounter (Signed)
Spoke with patient and she is in [place] now and requesting to speak with [facility] regarding farther testing. Stated that she was going to also request [employer] to forwarded his notes to [place]. Patient would like to speak with [facility] directly.

## 2018-08-08 NOTE — Telephone Encounter (Signed)
Patient called today and stated she needed to speak with Man [name] NP urgently. Informed patient provider is currently out of the office until Monday. Patient state she did not want to wait until provider returned. She states she was in [place] and seen a provider at [facility] and [facility] at [facility] [phone].  He diagnosed her with having Cushing's Disease and while there 1 scan was done. She states was told by provider more test would need to be performed before treatment could begin.   Records have not been collected from provider there. Man [name], Np was unable to reach provider to request medical record. Routing to medical assistant covering [place]. Provider name is Wende Bushy NP.

## 2018-08-09 NOTE — Telephone Encounter (Signed)
I called patient no answer I left a message for her to call the office and schedule an appointment to be seen by [name] mast on her clinic day

## 2018-08-09 NOTE — Telephone Encounter (Signed)
I returned patient call and no answer a message was left for the patient to call the office and schedule an appointment to see [name] [name] on her clinic day.

## 2018-08-11 NOTE — Telephone Encounter (Signed)
Patient came by the clinic to pick up a Release of record form to have her medical records in [facility] sent to [facility] for review because patient states she was told she had cushions disease. Patient also scheduled an appointment to see [name] on 7/30 to follow up with her medical concerns.

## 2018-08-15 NOTE — Telephone Encounter (Signed)
Called and spoke with patient she states she had [facility] to fax the release of records form to [facility] and asked if our office had received them I indicated we had not received the records, but that she could keep her appointment with [name] [name] on 7/30 and they could discuss her concerns.

## 2018-08-16 NOTE — Telephone Encounter (Signed)
Patient called to inform Darlene [name] and Darlene [name] that she called her former doctor in [place] and requested that they urgently send her records to [place]. Patient hopes we will have these records by tomorrow morning prior to her appointment.  I checked with Roland Earl in medical records and records not received as of now. We will await to see if records arrive

## 2018-08-17 ENCOUNTER — Non-Acute Institutional Stay: Payer: Medicare Other | Admitting: Nurse Practitioner

## 2018-08-17 ENCOUNTER — Other Ambulatory Visit: Payer: Self-pay

## 2018-08-17 DIAGNOSIS — F411 Generalized anxiety disorder: Secondary | ICD-10-CM | POA: Diagnosis not present

## 2018-08-17 DIAGNOSIS — K219 Gastro-esophageal reflux disease without esophagitis: Secondary | ICD-10-CM

## 2018-08-17 DIAGNOSIS — J329 Chronic sinusitis, unspecified: Secondary | ICD-10-CM

## 2018-08-17 DIAGNOSIS — I10 Essential (primary) hypertension: Secondary | ICD-10-CM

## 2018-08-17 NOTE — Progress Notes (Signed)
Location:   clinic [place]   Place of Service:   clinic [place] Provider: Marlana Latus NP  Code Status: DNR Goals of Care: No flowsheet data found.   Chief Complaint  Patient presents with  . Medical Management of Chronic Issues    HPI: Patient is a 85 y.o. female seen today for medical management of chronic diseases.    The patient has history of sinus congestion, has been evaluation by ENT, CT done, treated with ABT, no improvement. ENT desires further work up to r/o possible Cushing disease. On Flonase qd, Zyrtec 10mg  qd, Azelastine prn, Albuterol Neb pan for chronic rhinitis. Anxiety, taking Diazepam 5mg  qd.   Hx of HTN, blood pressure is controlled on Losartan 100mg  qd.   Past Medical History:  Diagnosis Date  . Asthma 04/06/2018  . Hyperlipidemia   . Hypertension   . Hypothyroidism   . Sleep apnea   . Stroke [facility]) 2008    Past Surgical History:  Procedure Laterality Date  . ADENOIDECTOMY    . APPENDECTOMY  1960  . [place] SURGERY  2014  . TONSILLECTOMY  1940    Allergies  Allergen Reactions  . Dust Mite Extract   . Oxycontin [Oxycodone Hcl] Nausea And Vomiting  . Pollen Extract     Allergies as of 08/17/2018      Reactions   Dust Mite Extract    Oxycontin [oxycodone Hcl] Nausea And Vomiting   Pollen Extract       Medication List       Accurate as of August 17, 2018 11:59 PM. If you have any questions, ask your nurse or doctor.        STOP taking these medications   fluticasone 50 MCG/ACT nasal spray Commonly known as: FLONASE Stopped by: Boubacar Lerette X Slyvia Lartigue, NP     TAKE these medications   acetaminophen 325 MG tablet Commonly known as: TYLENOL Take 650 mg by mouth as needed (for sinus headache).   albuterol 108 (90 Base) MCG/ACT inhaler Commonly known as: VENTOLIN HFA Inhale 2 puffs into the lungs every 6 (six) hours as needed for wheezing or shortness of breath.   azelastine 0.1 % nasal spray Commonly known as: ASTELIN Place 2 sprays into both  nostrils 2 (two) times daily.   beclomethasone 80 MCG/ACT inhaler Commonly known as: Qvar RediHaler Inhale 1 puff into the lungs 2 (two) times daily.   cetirizine 10 MG tablet Commonly known as: ZyrTEC Allergy Take 1 tablet (10 mg total) by mouth daily.   clopidogrel 75 MG tablet Commonly known as: PLAVIX TAKE 1 TABLET BY MOUTH EVERY DAY   diazepam 5 MG tablet Commonly known as: VALIUM Take 1 tablet (5 mg total) by mouth daily.   levothyroxine 100 MCG tablet Commonly known as: SYNTHROID Take 100 mcg by mouth daily before breakfast.   losartan 100 MG tablet Commonly known as: COZAAR Take 1/2 tablet by mouth daily.   pravastatin 40 MG tablet Commonly known as: PRAVACHOL Take 40 mg by mouth at bedtime.   sodium chloride 0.65 % Soln nasal spray Commonly known as: OCEAN Place 1 spray into both nostrils as needed for congestion.       Review of Systems:  Review of Systems  Constitutional: Negative for activity change, appetite change, chills, diaphoresis, fatigue and fever.  HENT: Positive for congestion, facial swelling, hearing loss, postnasal drip, rhinorrhea and sinus pressure. Negative for ear discharge, ear pain, nosebleeds, sinus pain, sneezing, sore throat, tinnitus, trouble swallowing and voice change.  Eyes: Positive for visual disturbance.       Left peripheral visual field loss from previous CVA  Respiratory: Negative for cough, shortness of breath and wheezing.   Cardiovascular: Negative for chest pain, palpitations and leg swelling.  Gastrointestinal: Negative for abdominal distention, abdominal pain, constipation, diarrhea, nausea and vomiting.  Genitourinary: Negative for difficulty urinating, dysuria, frequency and urgency.  Skin: Negative for color change and pallor.  Neurological: Negative for dizziness, seizures, syncope, facial asymmetry, speech difficulty, weakness, light-headedness, numbness and headaches.  Psychiatric/Behavioral: Negative for  agitation, behavioral problems, hallucinations and sleep disturbance. The patient is nervous/anxious.     Health Maintenance  Topic Date Due  . DEXA SCAN  07/17/2000  . PNA vac Low Risk Adult (2 of 2 - PCV13) 01/18/2006  . INFLUENZA VACCINE  08/19/2018  . TETANUS/TDAP  11/26/2018    Physical Exam: Vitals:   08/17/18 1611  BP: 112/64  Pulse: 74  Resp: 20  SpO2: 98%  Weight: 151 lb (68.5 kg)   Body mass index is 26.75 kg/m. Physical Exam Vitals signs reviewed.  Constitutional:      General: She is not in acute distress.    Appearance: Normal appearance. She is not ill-appearing, toxic-appearing or diaphoretic.  HENT:     Head: Normocephalic and atraumatic.     Nose: Congestion and rhinorrhea present.     Mouth/Throat:     Mouth: Mucous membranes are moist.  Eyes:     General:        Right eye: No discharge.     Extraocular Movements: Extraocular movements intact.     Conjunctiva/sclera: Conjunctivae normal.     Pupils: Pupils are equal, round, and reactive to light.     Comments: Lateral left peripheral visual field loss from previous CVA  Neck:     Musculoskeletal: Normal range of motion and neck supple.  Cardiovascular:     Rate and Rhythm: Normal rate and regular rhythm.     Heart sounds: No murmur.  Pulmonary:     Effort: Pulmonary effort is normal.     Breath sounds: Normal breath sounds. No wheezing, rhonchi or rales.  Abdominal:     General: Bowel sounds are normal. There is no distension.     Palpations: Abdomen is soft.     Tenderness: There is no abdominal tenderness. There is no right CVA tenderness, left CVA tenderness, guarding or rebound.  Musculoskeletal:     Right lower leg: No edema.     Left lower leg: No edema.  Skin:    General: Skin is warm and dry.  Neurological:     General: No focal deficit present.     Mental Status: She is alert and oriented to person, place, and time. Mental status is at baseline.     Cranial Nerves: No cranial nerve  deficit.     Motor: No weakness.     Coordination: Coordination normal.     Gait: Gait normal.  Psychiatric:        Mood and Affect: Mood normal.        Behavior: Behavior normal.        Thought Content: Thought content normal.        Judgment: Judgment normal.     Labs reviewed: Basic Metabolic Panel: Recent Labs    12/13/17 0745  NA 144  K 4.2  CL 111*  CO2 26  GLUCOSE 114*  BUN 15  CREATININE 0.79  CALCIUM 9.5  TSH 1.85   Liver Function Tests:  Recent Labs    12/13/17 0745  AST 14  ALT 16  BILITOT 0.3  PROT 6.1   No results for input(s): LIPASE, AMYLASE in the last 8760 hours. No results for input(s): AMMONIA in the last 8760 hours. CBC: Recent Labs    12/13/17 0745  WBC 5.4  NEUTROABS 2,646  HGB 13.8  HCT 41.0  MCV 87.0  PLT 256   Lipid Panel: Recent Labs    12/13/17 0745  CHOL 149  HDL 46*  LDLCALC 82  TRIG 110  CHOLHDL 3.2   No results found for: HGBA1C  Procedures since last visit: No results found.  Assessment/Plan  Chronic sinusitis Persisted,  sinus congestion, has been evaluation by ENT, CT done, treated with ABT and steroids, no improvement. ENT desires further work up to r/o possible Cushing disease. On Flonase qd, Zyrtec 10mg  qd, Azelastine prn, Albuterol Neb pan for chronic rhinitis. endocrinology referral   GAD (generalized anxiety disorder) Chronic Diazepam use, it will be beneficial is the patient can wean Diazepam off and try SSRI  Gastroesophageal reflux disease EGD colonoscopy for chronic sore throat, GERD, Hx of polyps. GI Valarie Merino Mansouraty  9:50 am 09/01/18.   Essential hypertension Hx of HTN, blood pressure is controlled, continue Losartan 100mg  qd.      Labs/tests ordered:  Endocrinology referral  Next appt:  prn

## 2018-08-17 NOTE — Assessment & Plan Note (Signed)
EGD colonoscopy for chronic sore throat, GERD, Hx of polyps. GI Valarie Merino Mansouraty  9:50 am 09/01/18.

## 2018-08-17 NOTE — Assessment & Plan Note (Signed)
Chronic Diazepam use, it will be beneficial is the patient can wean Diazepam off and try SSRI

## 2018-08-17 NOTE — Assessment & Plan Note (Addendum)
Persisted,  sinus congestion, has been evaluation by ENT, CT done, treated with ABT and steroids, no improvement. ENT desires further work up to r/o possible Cushing disease. On Flonase qd, Zyrtec 10mg  qd, Azelastine prn, Albuterol Neb pan for chronic rhinitis. endocrinology referral

## 2018-08-18 ENCOUNTER — Encounter: Payer: Self-pay | Admitting: Nurse Practitioner

## 2018-08-18 NOTE — Assessment & Plan Note (Signed)
Hx of HTN, blood pressure is controlled, continue Losartan 100mg  qd.

## 2018-08-24 ENCOUNTER — Other Ambulatory Visit: Payer: Self-pay

## 2018-08-24 ENCOUNTER — Encounter: Payer: Medicare Other | Admitting: Nurse Practitioner

## 2018-09-01 ENCOUNTER — Telehealth: Payer: Self-pay | Admitting: General Surgery

## 2018-09-01 ENCOUNTER — Ambulatory Visit (INDEPENDENT_AMBULATORY_CARE_PROVIDER_SITE_OTHER): Payer: Medicare Other | Admitting: Gastroenterology

## 2018-09-01 ENCOUNTER — Encounter: Payer: Self-pay | Admitting: Gastroenterology

## 2018-09-01 ENCOUNTER — Other Ambulatory Visit (INDEPENDENT_AMBULATORY_CARE_PROVIDER_SITE_OTHER): Payer: Medicare Other

## 2018-09-01 VITALS — BP 120/70 | HR 80 | Temp 98.7°F | Ht 62.0 in | Wt 153.0 lb

## 2018-09-01 DIAGNOSIS — R12 Heartburn: Secondary | ICD-10-CM

## 2018-09-01 DIAGNOSIS — R1310 Dysphagia, unspecified: Secondary | ICD-10-CM

## 2018-09-01 DIAGNOSIS — J312 Chronic pharyngitis: Secondary | ICD-10-CM

## 2018-09-01 DIAGNOSIS — Z86010 Personal history of colonic polyps: Secondary | ICD-10-CM

## 2018-09-01 LAB — CBC
HCT: 44.1 % (ref 36.0–46.0)
Hemoglobin: 14.6 g/dL (ref 12.0–15.0)
MCHC: 33.2 g/dL (ref 30.0–36.0)
MCV: 92.3 fl (ref 78.0–100.0)
Platelets: 256 10*3/uL (ref 150.0–400.0)
RBC: 4.78 Mil/uL (ref 3.87–5.11)
RDW: 13.3 % (ref 11.5–15.5)
WBC: 7.4 10*3/uL (ref 4.0–10.5)

## 2018-09-01 MED ORDER — OMEPRAZOLE 20 MG PO CPDR: 20.0000 mg | DELAYED_RELEASE_CAPSULE | Freq: Every day | ORAL | 3 refills | Status: DC

## 2018-09-01 MED ORDER — PEG 3350-KCL-NA BICARB-NACL 420 G PO SOLR: 4000.0000 mL | Freq: Once | ORAL | 0 refills | Status: AC

## 2018-09-01 NOTE — Patient Instructions (Addendum)
If you are age 83 or older, your body mass index should be between 23-30. Your Body mass index is 27.98 kg/m. If this is out of the aforementioned range listed, please consider follow up with your Primary Care Provider.  If you are age 33 or younger, your body mass index should be between 19-25. Your Body mass index is 27.98 kg/m. If this is out of the aformentioned range listed, please consider follow up with your Primary Care Provider.  We have sent the following medications to your pharmacy for you to pick up at your convenience:  Omeprazole 20 mg take 1 tablet daily Golytely  Your provider has requested that you go to the basement level for lab work before leaving today. Press "B" on the elevator. The lab is located at the first door on the left as you exit the elevator.  You have been scheduled for a colonoscopy. Please follow written instructions given to you at your visit today.  Please pick up your prep supplies at the pharmacy within the next 1-3 days. If you use inhalers (even only as needed), please bring them with you on the day of your procedure.   Thank you for choosing me and [facility].  Justice Britain, MD

## 2018-09-01 NOTE — Telephone Encounter (Signed)
Request for surgical clearance:     Endoscopy Procedure  What type of surgery is being performed?     Colonoscopy/endoscopy  When is this surgery scheduled?     09/30/2018  What type of clearance is required ?   Pharmacy  Are there any medications that need to be held prior to surgery and how long? Plavix-hold 5 days  Practice name and name of physician performing surgery?      [facility]  What is your office phone and fax number?      Phone- [phone]  [phone]  Anesthesia type (None, local, MAC, general) ?       MAC

## 2018-09-03 DIAGNOSIS — Z86010 Personal history of colonic polyps: Secondary | ICD-10-CM | POA: Insufficient documentation

## 2018-09-03 DIAGNOSIS — R1310 Dysphagia, unspecified: Secondary | ICD-10-CM | POA: Insufficient documentation

## 2018-09-03 DIAGNOSIS — J312 Chronic pharyngitis: Secondary | ICD-10-CM | POA: Insufficient documentation

## 2018-09-03 DIAGNOSIS — R12 Heartburn: Secondary | ICD-10-CM | POA: Insufficient documentation

## 2018-09-03 NOTE — Progress Notes (Signed)
GASTROENTEROLOGY OUTPATIENT CLINIC VISIT   Primary Care Provider [name], [name], NP [address] [place] [address] [phone]  Referring Provider [name], [name], NP [address] [place],   [address] [phone]  Patient Profile: Darlene [name] is a 85 y.o. female with a pmh significant for CVA (on Plavix), atrial fibrillation, anxiety/depression, sleep apnea, colon polyps (unknown pathology), status post appendectomy, seborrheic keratoses (seen dermatology in the past), chronic sore throat.  The patient presents to the [facility] for an evaluation and management of problem(s) noted below:  Problem List 1. History of colonic polyps   2. Dysphagia, unspecified type   3. Chronic sore throat   4. Pyrosis     History of Present Illness This is the patient's first visit to the outpatient [place] clinic.  She has previously followed with gastroenterology in [place] and undergone regular surveillance colonoscopies for history of colon polyps.  She received a reminder from the provider in [place] that it was time for her to undergo a colonoscopy.  The patient would like to proceed with a colonoscopy.  Her bowel habits are normal with a bowel movement on a daily basis.  No blood in the stool (hematochezia or melena).  The patient states that she does deal with acid reflux rarely.  This often occurs if she is eating some sort of food that is spicy or tomato based.  But does not occur every day.  She does have a chronic sore throat and is seen ENT in [place] as well as in [place] (where her daughter and her husband live).  She interestingly will take Tums every evening to try and help with the sore throat and at times this can be helpful.  She has trouble swallowing solid foods infrequently but has had to regurgitate them before.  On 2 separate occasions she had a possible aspiration of the liquids.  Her weight has been increasing.  She has been  told that her chronic sore throat is a result of allergies.  She has been scoped by ENT in [place] and reported to have a inflamed throat but we do not have access to those records.  She has seen Dr. Lucia Gaskins here as part of ENT.  She is never had an upper endoscopy before.  The date of her last colonoscopy is not clear.  GI Review of Systems Positive as above including a nocturnal cough (longstanding), throat clearing Negative for odynophagia, postprandial abdominal pain, change in bowel habits  Review of Systems General: Denies fevers/chills HEENT: Denies oral lesions Cardiovascular: Denies chest pain/palpitations Pulmonary: Denies shortness of breath Gastroenterological: See HPI Genitourinary: Denies darkened urine or hematuria Hematological: Positive for easy bruising/bleeding due to Plavix Dermatological: Denies jaundice Psychological: Mood is stable though at times can be very anxious as result of her husband's dementia and having to live in [place]   Medications Current Outpatient Medications  Medication Sig Dispense Refill  . acetaminophen (TYLENOL) 325 MG tablet Take 650 mg by mouth as needed (for sinus headache).    Marland Kitchen azelastine (ASTELIN) 0.1 % nasal spray Place 2 sprays into both nostrils 2 (two) times daily. 30 mL 5  . clopidogrel (PLAVIX) 75 MG tablet TAKE 1 TABLET BY MOUTH EVERY DAY 90 tablet 1  . diazepam (VALIUM) 5 MG tablet Take 1 tablet (5 mg total) by mouth daily. 30 tablet 0  . fluticasone (FLONASE) 50 MCG/ACT nasal spray Place 1 spray into both nostrils daily.    Marland Kitchen  levothyroxine (SYNTHROID, LEVOTHROID) 100 MCG tablet Take 100 mcg by mouth daily before breakfast.    . losartan (COZAAR) 100 MG tablet Take 1/2 tablet by mouth daily. 90 tablet 1  . pravastatin (PRAVACHOL) 40 MG tablet Take 40 mg by mouth at bedtime.     . sodium chloride (OCEAN) 0.65 % SOLN nasal spray Place 1 spray into both nostrils as needed for congestion.    Marland Kitchen albuterol (PROVENTIL  HFA;VENTOLIN HFA) 108 (90 Base) MCG/ACT inhaler Inhale 2 puffs into the lungs every 6 (six) hours as needed for wheezing or shortness of breath. (Patient not taking: Reported on 09/01/2018) 1 Inhaler 2  . beclomethasone (QVAR REDIHALER) 80 MCG/ACT inhaler Inhale 1 puff into the lungs 2 (two) times daily. (Patient not taking: Reported on 08/17/2018) 1 Inhaler 6  . omeprazole (PRILOSEC) 20 MG capsule Take 1 capsule (20 mg total) by mouth daily. 30 capsule 3   No current facility-administered medications for this visit.     Allergies Allergies  Allergen Reactions  . Dust Mite Extract   . Oxycontin [Oxycodone Hcl] Nausea And Vomiting  . Pollen Extract     Histories Past Medical History:  Diagnosis Date  . Anxiety   . Asthma 04/06/2018  . Atrial fibrillation ([place])   . Colon polyps   . Depression   . Hyperlipidemia   . Hypertension   . Hypothyroidism   . Skin cancer   . Sleep apnea   . Stroke [facility]) 2008   Past Surgical History:  Procedure Laterality Date  . ADENOIDECTOMY    . APPENDECTOMY  1960  . [place] SURGERY  2014  . TONSILLECTOMY  1940   Social History   Socioeconomic History  . Marital status: Divorced    Spouse name: Not on file  . Number of children: 3  . Years of education: Not on file  . Highest education level: Not on file  Occupational History  . Occupation: retired Animal nutritionist  Social Needs  . Financial resource strain: Not on file  . Food insecurity    Worry: Not on file    Inability: Not on file  . Transportation needs    Medical: Not on file    Non-medical: Not on file  Tobacco Use  . Smoking status: Never Smoker  . Smokeless tobacco: Never Used  Substance and Sexual Activity  . Alcohol use: Yes    Alcohol/week: 2.0 standard drinks    Types: 2 Glasses of wine per week  . Drug use: Never  . Sexual activity: Not on file  Lifestyle  . Physical activity    Days per week: Not on file    Minutes per session: Not on file  . Stress: Not on  file  Relationships  . Social Herbalist on phone: Not on file    Gets together: Not on file    Attends religious service: Not on file    Active member of club or organization: Not on file    Attends meetings of clubs or organizations: Not on file    Relationship status: Not on file  . Intimate partner violence    Fear of current or ex partner: Not on file    Emotionally abused: Not on file    Physically abused: Not on file    Forced sexual activity: Not on file  Other Topics Concern  . Not on file  Social History Narrative   Social History      Diet? ok  Do you drink/eat things with caffeine? Weak coffee, (much milk)      Marital status?          divorced                          What year were you married? 1961      Do you live in a house, apartment, assisted living, condo, trailer, etc.? apartment      Is it one or more stories? 4      How many persons live in your home? Only me      Do you have any pets in your home? (please list) no      Highest level of education completed? 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             yes                         Type & how often? Walk- would love to return to water exercise      Advanced Directives      Do you have a living will? yes      Do you have a DNR form?           no                       If not, do you want to discuss one? no      Do you have signed POA/HPOA for forms? no      Functional Status      Do you have difficulty bathing or dressing yourself?  no      Do you have difficulty preparing food or eating? no      Do you have difficulty managing your medications? no      Do you have difficulty managing your finances?  no      Do you have difficulty affording your medications? no      Family History  Problem Relation Age of Onset  . Lung cancer Mother 83       smoker  . Heart disease Mother   . Stroke Father 41    . Allergic rhinitis Sister   . CVA Brother   . Lung cancer Maternal Grandfather   . Heart attack Paternal Grandfather   . Psoriasis Daughter   . Rheum arthritis Daughter   . Bipolar disorder Daughter    I have reviewed her medical, social, and family history in detail and updated the electronic medical record as necessary.    PHYSICAL EXAMINATION  BP 120/70 (BP Location: Left Arm, Patient Position: Sitting, Cuff Size: Normal)   Pulse 80   Temp 98.7 F (37.1 C)   Ht 5\' 2"  (1.575 m) Comment: height measured without shoes  Wt 153 lb (69.4 kg)   BMI 27.98 kg/m  Wt Readings from Last 3 Encounters:  09/01/18 153 lb (69.4 kg)  08/17/18 151 lb (68.5 kg)  02/23/18 158 lb (71.7 kg)  GEN: NAD, appears stated age, doesn't appear chronically ill PSYCH: Cooperative, without pressured speech EYE: Conjunctivae pink, sclerae anicteric ENT: MMM, without oral ulcers NECK: Supple CV: Non-tachycardic without rubs or gallops RESP: CTAB posteriorly, without wheezing GI: NABS, soft, NT/ND, without rebound or guarding, no HSM appreciated MSK/EXT: No lower extremity edema SKIN: No jaundice NEURO:  Alert & Oriented x 3, no focal deficits   REVIEW OF DATA  I reviewed the following data at the time of this encounter:  GI Procedures and Studies  Reported colonoscopy within the last 3 to 5 years (was told to have a follow-up this year per letter in mail) Does not recall name of gastroenterologist  Laboratory Studies  Reviewed those in epic  Imaging Studies  No relevant studies to review   ASSESSMENT  Ms. Fredlund is a 85 y.o. female with a pmh significant for CVA (on Plavix), atrial fibrillation, anxiety/depression, sleep apnea, colon polyps (unknown pathology), status post appendectomy, seborrheic keratoses (seen dermatology in the past), chronic sore throat.  The patient is seen today for evaluation and management of:  1. History of colonic polyps   2. Dysphagia, unspecified type   3.  Chronic sore throat   4. Pyrosis    The patient is clinically and hemodynamically stable.  She is relatively fit for her age.  She recalls a recent colonoscopy but cannot tell the exact year but was told that she should have a follow-up colonoscopy at this time.  We will attempt to try and get those records and if we can then review those for accuracy and timing of need for follow-up colonoscopy.  If we cannot get them then I think her life expectancy and overall health is such that she would benefit from at least 1 more colonoscopy.  She also has symptoms from an upper perspective that could be acid related.  We again initiate her on once daily dosing of PPI.  Would like to see how she feels by doing that rather than Tums.  Not sure that her chronic sore throat is a result of laryngopharyngeal reflux but it will be worthwhile to see how she does with acid reducing therapy.  Will plan to try and get the records from the ENT providers in [place] in [place] and also from her gastroenterologist in [place] if possible.  Thus we will proceed with an upper endoscopy to evaluate the upper GI tract and consider dilation as necessary as there has been some periods of dysphagia.  If unremarkable may consider the role of manometry based on how she does with acid reducing therapy.  The risks and benefits of endoscopic evaluation were discussed with the patient; these include but are not limited to the risk of perforation, infection, bleeding, missed lesions, lack of diagnosis, severe illness requiring hospitalization, as well as anesthesia and sedation related illnesses.  The patient is agreeable to proceed.    PLAN  Baseline CBC to be obtained Proceed with scheduling EGD/colonoscopy (in 3 to 4 weeks so that we can try and get outside records) Obtain ENT records from [facility] and [place] omeprazole 20 mg once daily For now we will hold on manometry   Orders Placed This Encounter   Procedures  . CBC  . Ambulatory referral to Gastroenterology    New Prescriptions   OMEPRAZOLE (PRILOSEC) 20 MG CAPSULE    Take 1 capsule (20 mg total) by mouth daily.   Modified Medications   No medications on file    Planned Follow Up No follow-ups on file.   Justice Britain, MD [facility] Advanced Endoscopy Office # [phone]

## 2018-09-05 NOTE — Telephone Encounter (Signed)
[facility] Pre-operative Risk Assessment     Request for surgical clearance:     Endoscopy Procedure  What type of surgery is being performed?  Colonoscopy/endoscopy  When is this surgery scheduled?    09/30/2018  What type of clearance is required ?   Pharmacy  Are there any medications that need to be held prior to surgery and how long? Hold plavix for 5 days  Practice name and name of physician performing surgery?      [facility]  What is your office phone and fax number?      Phone- [phone]  [phone]  Anesthesia type (None, local, MAC, general) ?       MAC

## 2018-09-05 NOTE — Telephone Encounter (Signed)
   Primary Cardiologist: No primary care provider on file.  Chart reviewed as part of pre-operative protocol coverage. It appears that Ms. Wolff does not follow with [facility]. On chart review, she is on plavix for CVA history. Recommendations for holding plavix should be directed to her neurologist or geriatrician.   I will route this recommendation to the requesting party via Epic fax function and remove from pre-op pool.  Please call with questions.  Abigail Butts, PA-C 09/05/2018, 4:44 PM

## 2018-09-05 NOTE — Telephone Encounter (Signed)
-----   Message from Letta Pate, [place] sent at 09/01/2018 11:45 AM EDT ----- Regarding: anticoag clearance Patient scheduled 09/30/2018 for colon/endo-dr Mansouraty- wants 5 days off plavix

## 2018-09-08 ENCOUNTER — Telehealth: Payer: Self-pay | Admitting: Gastroenterology

## 2018-09-13 NOTE — Telephone Encounter (Signed)
Tried reaching out to [facility] to obtain information that is needed. Unable to get anyone from the pharmacy on the line. Recording to pharmacy says to try call later. I will try to reach out to them again tomorrow.

## 2018-09-14 ENCOUNTER — Other Ambulatory Visit: Payer: Self-pay | Admitting: Nurse Practitioner

## 2018-09-14 ENCOUNTER — Telehealth: Payer: Self-pay | Admitting: *Deleted

## 2018-09-14 NOTE — Telephone Encounter (Signed)
Received fax from [place] with [facility] for Request for Surgical Clearance for Endoscopy/Colonoscopy on 09/30/2018. They are wanting confirmation to HOLD Plavix for 5 days prior to procedure and the Start date to start it back. Please Advise.   (Paperwork in [place] box to review)

## 2018-09-15 NOTE — Telephone Encounter (Signed)
[name], [name], NP  You 4 minutes ago (10:00 AM)   Hold Plavix for 5 days prior to the procedure, restart Plavix 24hours after the procedure if Okay with GI   Message text       Printed message and attached to paperwork and faxed back to [place]ona with [facility].

## 2018-09-18 NOTE — Telephone Encounter (Signed)
See below clearance 

## 2018-09-18 NOTE — Telephone Encounter (Signed)
Thank you for update. Agree with time period for holding. Dependent on what we end up finding/doing intraprocedurally, I will decide on 24/48/72 hour restart Plavix based on safety. Thanks. GM

## 2018-09-19 DIAGNOSIS — D039 Melanoma in situ, unspecified: Secondary | ICD-10-CM

## 2018-09-19 HISTORY — DX: Melanoma in situ, unspecified: D03.9

## 2018-09-26 ENCOUNTER — Other Ambulatory Visit: Payer: Self-pay | Admitting: *Deleted

## 2018-09-26 MED ORDER — DIAZEPAM 5 MG PO TABS: 5.0000 mg | ORAL_TABLET | Freq: Every day | ORAL | 0 refills | Status: DC

## 2018-09-26 NOTE — Telephone Encounter (Signed)
Pharmacy requested refill Epic LR 07/19/2018 Pended Rx and sent to [facility] for approval.

## 2018-09-30 ENCOUNTER — Encounter: Payer: Medicare Other | Admitting: Gastroenterology

## 2018-10-13 ENCOUNTER — Encounter: Payer: Self-pay | Admitting: Gastroenterology

## 2018-10-25 ENCOUNTER — Telehealth: Payer: Self-pay

## 2018-10-25 NOTE — Telephone Encounter (Signed)
Covid-19 screening questions   Do you now or have you had a fever in the last 14 days? NO   Do you have any respiratory symptoms of shortness of breath or cough now or in the last 14 days? NO  Do you have any family members or close contacts with diagnosed or suspected Covid-19 in the past 14 days? NO  Have you been tested for Covid-19 and found to be positive? NO        

## 2018-10-26 ENCOUNTER — Other Ambulatory Visit: Payer: Self-pay

## 2018-10-26 ENCOUNTER — Encounter: Payer: Self-pay | Admitting: Gastroenterology

## 2018-10-26 ENCOUNTER — Ambulatory Visit (AMBULATORY_SURGERY_CENTER): Payer: Medicare Other | Admitting: Gastroenterology

## 2018-10-26 VITALS — BP 133/59 | HR 63 | Temp 98.2°F | Resp 16 | Ht 62.0 in | Wt 153.0 lb

## 2018-10-26 DIAGNOSIS — R1310 Dysphagia, unspecified: Secondary | ICD-10-CM | POA: Diagnosis not present

## 2018-10-26 DIAGNOSIS — K449 Diaphragmatic hernia without obstruction or gangrene: Secondary | ICD-10-CM

## 2018-10-26 DIAGNOSIS — Z86010 Personal history of colonic polyps: Secondary | ICD-10-CM

## 2018-10-26 DIAGNOSIS — K2100 Gastro-esophageal reflux disease with esophagitis, without bleeding: Secondary | ICD-10-CM

## 2018-10-26 DIAGNOSIS — R140 Abdominal distension (gaseous): Secondary | ICD-10-CM

## 2018-10-26 DIAGNOSIS — K5520 Angiodysplasia of colon without hemorrhage: Secondary | ICD-10-CM

## 2018-10-26 DIAGNOSIS — K644 Residual hemorrhoidal skin tags: Secondary | ICD-10-CM

## 2018-10-26 DIAGNOSIS — D120 Benign neoplasm of cecum: Secondary | ICD-10-CM | POA: Diagnosis not present

## 2018-10-26 DIAGNOSIS — D122 Benign neoplasm of ascending colon: Secondary | ICD-10-CM | POA: Diagnosis not present

## 2018-10-26 DIAGNOSIS — K5730 Diverticulosis of large intestine without perforation or abscess without bleeding: Secondary | ICD-10-CM

## 2018-10-26 DIAGNOSIS — K640 First degree hemorrhoids: Secondary | ICD-10-CM

## 2018-10-26 MED ORDER — OMEPRAZOLE 40 MG PO CPDR: DELAYED_RELEASE_CAPSULE | ORAL | 0 refills | Status: DC

## 2018-10-26 MED ORDER — SODIUM CHLORIDE 0.9 % IV SOLN: 500.0000 mL | Freq: Once | INTRAVENOUS | Status: DC

## 2018-10-26 NOTE — Progress Notes (Signed)
To PACU, VSS. Report to [email or web address].tb 

## 2018-10-26 NOTE — Progress Notes (Signed)
Called to room to assist during endoscopic procedure.  Patient ID and intended procedure confirmed with present staff. Received instructions for my participation in the procedure from the performing physician.  

## 2018-10-26 NOTE — Patient Instructions (Signed)
YOU HAD AN ENDOSCOPIC PROCEDURE TODAY AT THE [facility]:   Refer to the procedure report that was given to you for any specific questions about what was found during the examination.  If the procedure report does not answer your questions, please call your gastroenterologist to clarify.  If you requested that your care partner not be given the details of your procedure findings, then the procedure report has been included in a sealed envelope for you to review at your convenience later.  YOU SHOULD EXPECT: Some feelings of bloating in the abdomen. Passage of more gas than usual.  Walking can help get rid of the air that was put into your GI tract during the procedure and reduce the bloating. If you had a lower endoscopy (such as a colonoscopy or flexible sigmoidoscopy) you may notice spotting of blood in your stool or on the toilet paper. If you underwent a bowel prep for your procedure, you may not have a normal bowel movement for a few days.  Please Note:  You might notice some irritation and congestion in your nose or some drainage.  This is from the oxygen used during your procedure.  There is no need for concern and it should clear up in a day or so.  SYMPTOMS TO REPORT IMMEDIATELY:   Following lower endoscopy (colonoscopy or flexible sigmoidoscopy):  Excessive amounts of blood in the stool  Significant tenderness or worsening of abdominal pains  Swelling of the abdomen that is new, acute  Fever of 100F or higher   Following upper endoscopy (EGD)  Vomiting of blood or coffee ground material  New chest pain or pain under the shoulder blades  Painful or persistently difficult swallowing  New shortness of breath  Fever of 100F or higher  Black, tarry-looking stools  For urgent or emergent issues, a gastroenterologist can be reached at any hour by calling [phone].   DIET:  We do recommend a small meal at first, but then you may proceed to your regular diet.  Drink  plenty of fluids but you should avoid alcoholic beverages for 24 hours.  ACTIVITY:  You should plan to take it easy for the rest of today and you should NOT DRIVE or use heavy machinery until tomorrow (because of the sedation medicines used during the test).    FOLLOW UP: Our staff will call the number listed on your records 48-72 hours following your procedure to check on you and address any questions or concerns that you may have regarding the information given to you following your procedure. If we do not reach you, we will leave a message.  We will attempt to reach you two times.  During this call, we will ask if you have developed any symptoms of COVID 19. If you develop any symptoms (ie: fever, flu-like symptoms, shortness of breath, cough etc.) before then, please call [phone].  If you test positive for Covid 19 in the 2 weeks post procedure, please call and report this information to us.    If any biopsies were taken you will be contacted by phone or by letter within the next 1-3 weeks.  Please call us at [phone] if you have not heard about the biopsies in 3 weeks.    SIGNATURES/CONFIDENTIALITY: You and/or your care partner have signed paperwork which will be entered into your electronic medical record.  These signatures attest to the fact that that the information above on your After Visit Summary has been reviewed and is   understood.  Full responsibility of the confidentiality of this discharge information lies with you and/or your care-partner.   Resume PLAVIX ON Monday 10/30/18 ,START OMEPRAZOLE 40 MG TWICE A DAY FOR 3 MONTHS,SEE IN OFFICE TO  DISCUSS REPEAT EGD FOR January,USE OTC FIBER CON 1 TABLET DAILY,RESUME REMAINDER OF MEDICATIONS. INFORMATION GIVEN OM POLYPS,DIVERTICULOSIS,GERD

## 2018-10-26 NOTE — Op Note (Signed)
[facility] Patient Name: Darlene [name] Procedure Date: 10/26/2018 11:05 AM MRN: [id number] Endoscopist: Justice Britain , MD Age: 85 Referring MD:  Date of Birth: Dec 09, 1933 Gender: Female Account #: [id number] Procedure:                Upper GI endoscopy Indications:              Dysphagia, Heartburn, Sore throat Medicines:                Monitored Anesthesia Care Procedure:                Pre-Anesthesia Assessment:                           - Prior to the procedure, a History and Physical                            was performed, and patient medications and                            allergies were reviewed. The patient's tolerance of                            previous anesthesia was also reviewed. The risks                            and benefits of the procedure and the sedation                            options and risks were discussed with the patient.                            All questions were answered, and informed consent                            was obtained. Prior Anticoagulants: The patient                            last took Plavix (clopidogrel) 5 days prior to the                            procedure and has taken no previous anticoagulant                            or antiplatelet agents except for aspirin. ASA                            Grade Assessment: III - A patient with severe                            systemic disease. After reviewing the risks and                            benefits, the patient was deemed in satisfactory  condition to undergo the procedure.                           After obtaining informed consent, the endoscope was                            passed under direct vision. Throughout the                            procedure, the patient's blood pressure, pulse, and                            oxygen saturations were monitored continuously. The                            Endoscope was introduced  through the mouth, and                            advanced to the second part of duodenum. The upper                            GI endoscopy was accomplished without difficulty.                            The patient tolerated the procedure. Scope In: Scope Out: Findings:                 No gross lesions were noted in the proximal                            esophagus, in the mid esophagus and in the distal                            esophagus. Biopsies were taken with a cold forceps                            for histology for EoE.                           LA Grade C (one or more mucosal breaks continuous                            between tops of 2 or more mucosal folds, less than                            75% circumference) esophagitis with no bleeding was                            found at the gastroesophageal junction.                           A 4 cm hiatal hernia was found. The proximal extent  of the gastric folds (end of tubular esophagus) was                            34 cm from the incisors. The hiatal narrowing was                            37 cm from the incisors. The Z-line was 33 cm from                            the incisors.                           Localized mildly erythematous mucosa without                            bleeding was found in the gastric antrum.                           Multiple small semi-sessile polyps with no stigmata                            of recent bleeding were found in the gastric fundus                            and in the gastric body - likely fundic gland.                            Removed a few to rule out adenomatous.                           No other gross lesions were noted in the entire                            examined stomach. Biopsies were taken with a cold                            forceps for histology and Helicobacter pylori                            testing.                           No  gross lesions were noted in the duodenal bulb,                            in the first portion of the duodenum and in the                            second portion of the duodenum. Biopsies for                            histology were taken with a cold forceps for  evaluation of celiac disease. Complications:            No immediate complications. Estimated Blood Loss:     Estimated blood loss was minimal. Impression:               - No gross lesions in esophagus proximally.                            Biopsied.                           - LA Grade C esophagitis at GE Jxn.                           - 4 cm hiatal hernia.                           - Erythematous mucosa in the antrum. Biopsied.                           - Multiple gastric polyps - likely fundic gland.                            Biopsied a few.                           - No gross lesions in the duodenal bulb, in the                            first portion of the duodenum and in the second                            portion of the duodenum. Biopsied. Recommendation:           - Proceed to scheduled colonoscopy.                           - Observe patient's clinical course.                           - Await pathology results.                           - Increase PPI to 40 mg BID x 2-3 months. Then will                            plan to repeat EGD at that time and proceed with                            dilation after healing of esophagitis if dysphagia                            persists and then hopefully decrease PPI therafter                            to 40 mg daily.                           -  Restart of Plavix to be discussed on Colonoscopy.                           - The findings and recommendations were discussed                            with the patient. Justice Britain, MD 10/26/2018 12:04:07 PM

## 2018-10-26 NOTE — Op Note (Signed)
[facility] Patient Name: Darlene [name] Procedure Date: 10/26/2018 11:04 AM MRN: [id number] Endoscopist: Justice Britain , MD Age: 85 Referring MD:  Date of Birth: 03-11-1933 Gender: Female Account #: [id number] Procedure:                Colonoscopy Indications:              High risk colon cancer surveillance: Personal                            history of colonic polyps, Surveillance: Personal                            history of adenomatous polyps on last colonoscopy >                            5 years ago, Incidental - Change in bowel habits Medicines:                Monitored Anesthesia Care Procedure:                Pre-Anesthesia Assessment:                           - Prior to the procedure, a History and Physical                            was performed, and patient medications and                            allergies were reviewed. The patient's tolerance of                            previous anesthesia was also reviewed. The risks                            and benefits of the procedure and the sedation                            options and risks were discussed with the patient.                            All questions were answered, and informed consent                            was obtained. Prior Anticoagulants: The patient                            last took Plavix (clopidogrel) 5 days prior to the                            procedure and has taken no previous anticoagulant                            or antiplatelet agents except for aspirin. ASA  Grade Assessment: III - A patient with severe                            systemic disease. After reviewing the risks and                            benefits, the patient was deemed in satisfactory                            condition to undergo the procedure.                           After obtaining informed consent, the colonoscope                            was passed under  direct vision. Throughout the                            procedure, the patient's blood pressure, pulse, and                            oxygen saturations were monitored continuously. The                            Colonoscope was introduced through the anus and                            advanced to the 5 cm into the ileum. The                            colonoscopy was performed without difficulty. The                            patient tolerated the procedure. The quality of the                            bowel preparation was good. The terminal ileum,                            ileocecal valve, appendiceal orifice, and rectum                            were photographed. Scope In: 11:24:33 AM Scope Out: 11:44:28 AM Scope Withdrawal Time: 0 hours 16 minutes 25 seconds  Total Procedure Duration: 0 hours 19 minutes 55 seconds  Findings:                 Skin tags were found on perianal exam.                           The digital rectal exam findings include External                            and Internal hemorrhoids. Pertinent negatives  include no palpable rectal lesions.                           The terminal ileum and ileocecal valve appeared                            normal.                           A single medium-sized angioectasia with typical                            arborization was found in the cecum.                           Five sessile polyps were found in the ascending                            colon (2) and cecum (3). The polyps were 2 to 13 mm                            in size. These polyps were removed with a cold                            snare. Resection and retrieval were complete.                           Multiple small and large-mouthed diverticula were                            found in the recto-sigmoid colon, sigmoid colon,                            transverse colon and ascending colon.                           Normal mucosa  was found in the entire colon                            otherwise.                           Non-bleeding non-thrombosed external and internal                            hemorrhoids were found during retroflexion, during                            perianal exam and during digital exam. The                            hemorrhoids were Grade II (internal hemorrhoids                            that prolapse but reduce spontaneously). Complications:  No immediate complications. Estimated Blood Loss:     Estimated blood loss was minimal. Impression:               - Perianal skin tags found on perianal exam.                           - Hemorrhoids found on digital rectal exam.                           - The examined portion of the ileum was normal.                           - A single colonic angioectasia.                           - Five 2 to 13 mm polyps in the ascending colon and                            in the cecum, removed with a cold snare. Resected                            and retrieved.                           - Diverticulosis in the recto-sigmoid colon, in the                            sigmoid colon, in the transverse colon and in the                            ascending colon.                           - Normal mucosa in the entire examined colon                            otherwise.                           - Non-bleeding non-thrombosed external and internal                            hemorrhoids. Recommendation:           - The patient will be observed post-procedure,                            until all discharge criteria are met.                           - Discharge patient to home.                           - Patient has a contact number available for  emergencies. The signs and symptoms of potential                            delayed complications were discussed with the                            patient. Return to normal  activities tomorrow.                            Written discharge instructions were provided to the                            patient.                           - High fiber diet.                           - Use FiberCon 1 tablet PO daily.                           - Post-Procedure Resumption of Antiplatelet                            Medications: Restart Plavix (clopidogrel) in 3 days.                           - Await pathology results.                           - Normally would recommend a repeat colonoscopy in                            3 years for surveillance based on pathology                            results, however, based on patient's current age                            and medical comorbidities we should have a                            conversation prior to potentially repeating                            procedure.                           - The findings and recommendations were discussed                            with the patient. Justice Britain, MD 10/26/2018 11:57:23 AM

## 2018-10-30 ENCOUNTER — Telehealth: Payer: Self-pay | Admitting: *Deleted

## 2018-10-30 ENCOUNTER — Encounter: Payer: Self-pay | Admitting: Gastroenterology

## 2018-10-30 NOTE — Telephone Encounter (Signed)
  Follow up Call-  Call back number 10/26/2018  Post procedure Call Back phone  # [phone]  Permission to leave phone message Yes  Some recent data might be hidden     Patient questions:  Do you have a fever, pain , or abdominal swelling? yes Pain Score  0 *  Have you tolerated food without any problems? Yes.    Have you been able to return to your normal activities? Yes.    Do you have any questions about your discharge instructions: Diet   No. Medications  Yes.   Follow up visit  No.  Do you have questions or concerns about your Care? Yes.    Pt called back for her post follow up call this morning c/o her abdomen being more bloated and swollen over the weekend becoming worse gradually. She says " it wasn't this bad on Thursday after the procedure." She also thinks the increased dose of the Omeprazole to 40 mg is "making me extremely sleepy." I told her I would let Dr. Rush Landmark know her concerns.   Actions: * If pain score is 4 or above: No action needed, pain <4.

## 2018-10-30 NOTE — Telephone Encounter (Signed)
Sarah, thank you for the follow-up. I tried to reach the patient this afternoon but only received her voicemail and left her a message. Patty, can you please reach out to the patient tomorrow? If she still having issues or symptoms please have her come in for a 2 view KUB and a 2 view chest x-ray to ensure there are no complications from the procedures that were performed. If she feels there are significant issues she can be brought down for a period of time to omeprazole 40 mg once daily and then over the next couple weeks can go back up to 40 mg. I will return on Wednesday, but if there are issues then please reach out to the doctor of the day on Tuesday, on my behalf. Thanks. GM

## 2018-10-30 NOTE — Telephone Encounter (Signed)
No answer for first post procedure call back. Left message and will call the patient back later this afternoon. SM

## 2018-10-31 NOTE — Telephone Encounter (Signed)
The pt has been called and she states she feels much better and the bloating has resolved.  She does say that she believes the omeprazole is causing her to be very sleepy and has a long drive to make tomorrow.  She says she will feel better about going down to omeprazole 40 mg once daily for now and increase back to 40 mg twice daily when she returns.

## 2018-11-01 NOTE — Telephone Encounter (Signed)
Thank you for the update. Please let her know I am glad she is doing better. Keep on QD 40 mg for now and see how she does. GM

## 2018-11-06 ENCOUNTER — Telehealth: Payer: Self-pay | Admitting: Gastroenterology

## 2018-11-06 NOTE — Telephone Encounter (Signed)
The pt has been advised and aware that she was also letters with the following information.    10/30/2018 MRN: [id number]   Darlene [name] [address] [place] [address]  Dear Ms. Darlene [name],  I am writing to inform you that the biopsies taken during your recent endoscopic examination showed:  Diagnosis 1. Surgical [P], duodenum - PEPTIC DUODENITIS - NO DYSPLASIA OR MALIGNANCY IDENTIFIED 2. Surgical [P], random gastric sites - REACTIVE GASTROPATHY WITH FOCAL CHRONIC INFLAMMATION - NO H. PYLORI OR INTESTINAL METAPLASIA IDENTIFIED - SEE COMMENT 3. Surgical [P], gastric polyps - FUNDIC GLAND POLYP(S) - NO H. PYLORI, INTESTINAL METAPLASIA OR MALIGNANCY IDENTIFIED 4. Surgical [P], esophageal - BENIGN SQUAMOUS MUCOSA - NO INCREASED INTRAEPITHELIAL EOSINOPHILS - SEE COMMENT Microscopic Comment 2. and 3. A Warthin-Starry stain is performed to determine the possibility of the presence of Helicobacter pylori. The Warthin-Starry stain is negative for organisms morphologically consistent with Helicobacter pylori. 4. PAS stain is NEGATIVE for fungal elements.   What does this is all mean? Your small intestine biopsies showed evidence of inflammation noted as duodenitis but no evidence of any precancerous changes. Your stomach biopsies showed evidence of inflammation but no H. pylori infection. The gastric polyps that were removed returned as fundic gland polyps which are benign and not precancerous and do not require follow-up. The esophagus biopsies showed no evidence of eosinophilic esophagitis or fungal infection. Please continue taking your acid reducing medication with plan for relook endoscopy and dilation in 2 to 3 months to ensure healing of the esophagitis before we begin a dilation protocol.   Please call us at Dept: [phone] if you have persistent problems or have questions about your condition that have not been fully answered at this  time.  Sincerely,  Irving Copas., MD  Darlene [name] [address] [place] [address]  Dear Darlene [name],  The polyps removed from your colon were a combination of tubular adenomas and sessile serrated adenomas.  Both of these types of polyps are precancerous meaning that they had the potential to change into cancer over time had they not been removed.  They have been removed.  As discussed previously, guidelines would suggest that a follow-up colonoscopy be considered in 3 years however due to your age and medical comorbidities we would have to have a discussion with your PCP and I in clinic whether we should continue colon polyp surveillance or make this your last colonoscopy, nearer the time of follow-up. .   If you develop any new rectal bleeding, abdominal pain or significant bowel habit changes, please contact us before then at Dept: [phone].  Please call us if you have persistent problems or have questions about your condition that have not been fully answered at this time.  Sincerely,  Irving Copas., MD

## 2018-11-20 ENCOUNTER — Other Ambulatory Visit: Payer: Self-pay

## 2018-11-24 ENCOUNTER — Encounter: Payer: Self-pay | Admitting: Gastroenterology

## 2018-11-28 ENCOUNTER — Telehealth: Payer: Self-pay | Admitting: Gastroenterology

## 2018-11-28 ENCOUNTER — Telehealth: Payer: Self-pay | Admitting: *Deleted

## 2018-11-28 DIAGNOSIS — Z1159 Encounter for screening for other viral diseases: Secondary | ICD-10-CM

## 2018-11-28 NOTE — Telephone Encounter (Signed)
Patient called and left message on clinical intake stating that she thinks she is having a reaction to a medication she started taking about a month ago.  Stated that she wanted to speak with [facility].    Medication: Omeprazole  Causing her to fall asleep Appointment scheduled for [facility] Thursday.

## 2018-11-28 NOTE — Telephone Encounter (Signed)
Left message on machine to call back  

## 2018-11-29 NOTE — Telephone Encounter (Signed)
I have tried to reach the pt by phone several times with no response.  I will wait for further communication from the pt.

## 2018-11-29 NOTE — Telephone Encounter (Signed)
Left message on machine to call back  

## 2018-11-30 ENCOUNTER — Encounter: Payer: Self-pay | Admitting: Nurse Practitioner

## 2018-11-30 ENCOUNTER — Other Ambulatory Visit: Payer: Self-pay

## 2018-11-30 ENCOUNTER — Non-Acute Institutional Stay: Payer: Medicare Other | Admitting: Nurse Practitioner

## 2018-11-30 DIAGNOSIS — I10 Essential (primary) hypertension: Secondary | ICD-10-CM | POA: Diagnosis not present

## 2018-11-30 DIAGNOSIS — E039 Hypothyroidism, unspecified: Secondary | ICD-10-CM

## 2018-11-30 DIAGNOSIS — R400 Somnolence: Secondary | ICD-10-CM | POA: Diagnosis not present

## 2018-11-30 DIAGNOSIS — Z8673 Personal history of transient ischemic attack (TIA), and cerebral infarction without residual deficits: Secondary | ICD-10-CM

## 2018-11-30 DIAGNOSIS — K219 Gastro-esophageal reflux disease without esophagitis: Secondary | ICD-10-CM | POA: Diagnosis not present

## 2018-11-30 DIAGNOSIS — E785 Hyperlipidemia, unspecified: Secondary | ICD-10-CM

## 2018-11-30 MED ORDER — DIAZEPAM 5 MG PO TABS: 5.0000 mg | ORAL_TABLET | Freq: Every day | ORAL | 0 refills | Status: DC

## 2018-11-30 NOTE — Assessment & Plan Note (Signed)
CVA 2006 Dr, Nadyne Coombes 12/19/17 okay to ASA 72mt. She is taking Plavix 75mg  qd presently. Update lipid panel.

## 2018-11-30 NOTE — Assessment & Plan Note (Addendum)
Recent, for a month so, she denied headache, dizziness, change of vision,  chest pain/pressrue, palpitation, nausea, vomiting, diarrhea, constipation, or focal weakness. Will update CBC/diff, CMP/eGFR, TSH, lipid panel.

## 2018-11-30 NOTE — Assessment & Plan Note (Addendum)
Update lipid panel. Continue Pravastatin 40mg  qd.

## 2018-11-30 NOTE — Progress Notes (Signed)
Location:   clinic [place]   Place of Service:  Clinic (12) Provider: Marlana Latus NP  Code Status: DNR Goals of Care: No flowsheet data found.   Chief Complaint  Patient presents with  . Acute Visit    Patient returns to the office complaining of being sleepy a lot and balance being off after sitting. She relates it to being on the omeprazole 40 mg twice a day. She has has a recent colonscopy and endoscopy. She has had a melanoma removed about 5-6 mos ago. Biopsy done on other arm. She would like some advice on helping her lose more weight.    HPI: Patient is a 85 y.o. female seen today for medical management of chronic diseases.    GERD, symptoms of unsettled stomach, sour test in throat, underwent GI evaluation, colonoscopy, upper endoscopy 10/26/18, started Omeprazole 19m bid. The patient stated it makes her sleepy, pending response from GI. HTN, blood pressure is controlled, on Losartan 1043mqd. Hypothyroidism, on Levothyroxine 10024mqd. CVA, taking Plavix 74m19m, Pravastatin 40mg73m    Past Medical History:  Diagnosis Date  . Anxiety   . Asthma 04/06/2018  . Atrial fibrillation (HCC) [facility] Colon polyps   . Depression   . Hyperlipidemia   . Hypertension   . Hypothyroidism   . Skin cancer   . Sleep apnea   . Stroke (HCC)[facility]    Past Surgical History:  Procedure Laterality Date  . ADENOIDECTOMY    . APPENDECTOMY  1960  . [place]  2014  . MESH APPLIED TO LAP PORT    . TONSILLECTOMY  1940    Allergies  Allergen Reactions  . Dust Mite Extract   . Oxycontin [Oxycodone Hcl] Nausea And Vomiting  . Pollen Extract     Allergies as of 11/30/2018      Reactions   Dust Mite Extract    Oxycontin [oxycodone Hcl] Nausea And Vomiting   Pollen Extract       Medication List       Accurate as of November 30, 2018  3:34 PM. If you have any questions, ask your nurse or doctor.        STOP taking these medications   albuterol 108 (90 Base) MCG/ACT inhaler  Commonly known as: VENTOLIN HFA Stopped by:  X , NP     TAKE these medications   acetaminophen 325 MG tablet Commonly known as: TYLENOL Take 650 mg by mouth as needed (for sinus headache).   azelastine 0.1 % nasal spray Commonly known as: ASTELIN Place 2 sprays into both nostrils 2 (two) times daily.   beclomethasone 80 MCG/ACT inhaler Commonly known as: Qvar RediHaler Inhale 1 puff into the lungs 2 (two) times daily.   clopidogrel 75 MG tablet Commonly known as: PLAVIX TAKE 1 TABLET BY MOUTH EVERY DAY   diazepam 5 MG tablet Commonly known as: VALIUM Take 1 tablet (5 mg total) by mouth daily.   fluticasone 50 MCG/ACT nasal spray Commonly known as: FLONASE Place 1 spray into both nostrils daily.   levothyroxine 100 MCG tablet Commonly known as: SYNTHROID Take 100 mcg by mouth daily before breakfast.   losartan 100 MG tablet Commonly known as: COZAAR TAKE 1/2 TABLET BY MOUTH EVERY DAY   omeprazole 20 MG capsule Commonly known as: PRILOSEC Take 1 capsule (20 mg total) by mouth daily.   omeprazole 40 MG capsule Commonly known as: PRILOSEC Take 40 mg two times daily for 3 months  pravastatin 40 MG tablet Commonly known as: PRAVACHOL Take 40 mg by mouth at bedtime.   sodium chloride 0.65 % Soln nasal spray Commonly known as: OCEAN Place 1 spray into both nostrils as needed for congestion.       Review of Systems:  Review of Systems  Constitutional: Negative for activity change, appetite change, chills, diaphoresis, fatigue and fever.  HENT: Positive for congestion, hearing loss, postnasal drip, rhinorrhea, sinus pressure and sore throat. Negative for dental problem, drooling, ear discharge, ear pain, facial swelling, mouth sores, nosebleeds, sinus pain, sneezing and voice change.        Chronic sore throat/sour taste in throat.  Eyes: Positive for visual disturbance.       Left peripheral visual field  loss form CVA 2006  Respiratory: Positive for  cough. Negative for shortness of breath and wheezing.   Cardiovascular: Negative for chest pain, palpitations and leg swelling.  Gastrointestinal: Negative for abdominal distention, abdominal pain, constipation, diarrhea, nausea and vomiting.  Genitourinary: Negative for difficulty urinating, dysuria and urgency.  Musculoskeletal: Positive for gait problem.  Skin: Negative for color change and pallor.  Neurological: Negative for dizziness, facial asymmetry, speech difficulty, weakness, numbness and headaches.  Psychiatric/Behavioral: Positive for dysphoric mood and sleep disturbance. Negative for agitation, behavioral problems and hallucinations. The patient is nervous/anxious.     Health Maintenance  Topic Date Due  . DEXA SCAN  07/17/2000  . PNA vac Low Risk Adult (2 of 2 - PCV13) 01/18/2006  . INFLUENZA VACCINE  08/19/2018  . TETANUS/TDAP  11/26/2018  . COLONOSCOPY  10/25/2021    Physical Exam: Vitals:   11/30/18 1342  BP: (!) 112/58  Pulse: 81  Temp: (!) 97.1 F (36.2 C)  SpO2: 96%  Weight: 159 lb 12.8 oz (72.5 kg)  Height: _0  (1.575 m)   Body mass index is 29.23 kg/m. Physical Exam Vitals signs and nursing note reviewed.  Constitutional:      General: She is not in acute distress.    Appearance: Normal appearance. She is not ill-appearing, toxic-appearing or diaphoretic.  HENT:     Head: Normocephalic and atraumatic.     Nose: Nose normal.     Mouth/Throat:     Mouth: Mucous membranes are moist.  Eyes:     Extraocular Movements: Extraocular movements intact.     Conjunctiva/sclera: Conjunctivae normal.     Pupils: Pupils are equal, round, and reactive to light.     Comments: Left lateral peripheral visual field loss.   Neck:     Musculoskeletal: Normal range of motion and neck supple.  Cardiovascular:     Rate and Rhythm: Normal rate and regular rhythm.     Heart sounds: No murmur.  Pulmonary:     Breath sounds: No wheezing, rhonchi or rales.  Abdominal:      General: Bowel sounds are normal. There is no distension.     Palpations: Abdomen is soft.     Tenderness: There is no abdominal tenderness. There is no right CVA tenderness, left CVA tenderness, guarding or rebound.  Musculoskeletal:     Right lower leg: No edema.     Left lower leg: No edema.  Skin:    General: Skin is warm and dry.  Neurological:     General: No focal deficit present.     Mental Status: She is alert and oriented to person, place, and time. Mental status is at baseline.     Cranial Nerves: No cranial nerve deficit.  Motor: No weakness.     Coordination: Coordination normal.     Gait: Gait normal.  Psychiatric:        Mood and Affect: Mood normal.        Behavior: Behavior normal.        Thought Content: Thought content normal.        Judgment: Judgment normal.     Labs reviewed: Basic Metabolic Panel: Recent Labs    12/13/17 0745  NA 144  K 4.2  CL 111*  CO2 26  GLUCOSE 114*  BUN 15  CREATININE 0.79  CALCIUM 9.5  TSH 1.85   Liver Function Tests: Recent Labs    12/13/17 0745  AST 14  ALT 16  BILITOT 0.3  PROT 6.1   No results for input(s): LIPASE, AMYLASE in the last 8760 hours. No results for input(s): AMMONIA in the last 8760 hours. CBC: Recent Labs    12/13/17 0745 09/01/18 1139  WBC 5.4 7.4  NEUTROABS 2,646  --   HGB 13.8 14.6  HCT 41.0 44.1  MCV 87.0 92.3  PLT 256 256.0   Lipid Panel: Recent Labs    12/13/17 0745  CHOL 149  HDL 46*  LDLCALC 82  TRIG 110  CHOLHDL 3.2   No results found for: HGBA1C  Procedures since last visit: No results found.  Assessment/Plan  Daytime sleepiness Recent, for a month so, she denied headache, dizziness, change of vision,  chest pain/pressrue, palpitation, nausea, vomiting, diarrhea, constipation, or focal weakness. Will update CBC/diff, CMP/eGFR, TSH, lipid panel.  Gastroesophageal reflux disease 10/26/18 EGD, colonoscopy, unspecified dysphagia, benign neoplasm ascending  colon cecum. Your small intestine biopsies showed evidence of inflammation noted as duodenitis but no evidence of any precancerous changes. Your stomach biopsies showed evidence of inflammation but no H. pylori infection. The gastric polyps that were removed returned as fundic gland polyps which are benign and not precancerous and do not require follow-up. The esophagus biopsies showed no evidence of eosinophilic esophagitis or fungal  11/30/18 continue Omeprazole 11m bid po prescribed by GI, the patient though it may caused her day time sleepiness, she is waiting for response from GI.    Essential hypertension Blood pressure is controlled, continue Losartan 1052mqd.   Hyperlipidemia Update lipid panel. Continue Pravastatin 4054md.   History of CVA (cerebrovascular accident) CVA 2006 Dr, GanNadyne Coombes/2/19 okay to ASA 80m50mhe is taking Plavix 75mg69mpresently. Update lipid panel.   Acquired hypothyroidism Last TSH 1.85 12/05/17, continue Levothyroxine 100mcg63m   Labs/tests ordered:  CBC/diff, CMP/eGFR, TSH, Lipid panel.   Next appt:  3 weeks

## 2018-11-30 NOTE — Patient Instructions (Addendum)
CBC/diff, CMP/eGFR, TSH, Lipid panel. Next appt:  3 weeks

## 2018-11-30 NOTE — Assessment & Plan Note (Signed)
10/26/18 EGD, colonoscopy, unspecified dysphagia, benign neoplasm ascending colon cecum. Your small intestine biopsies showed evidence of inflammation noted as duodenitis but no evidence of any precancerous changes. Your stomach biopsies showed evidence of inflammation but no H. pylori infection. The gastric polyps that were removed returned as fundic gland polyps which are benign and not precancerous and do not require follow-up. The esophagus biopsies showed no evidence of eosinophilic esophagitis or fungal  11/30/18 continue Omeprazole 40mg  bid po prescribed by GI, the patient though it may caused her day time sleepiness, she is waiting for response from GI.

## 2018-11-30 NOTE — Assessment & Plan Note (Signed)
Blood pressure is controlled, continue Losartan 100mg qd.  

## 2018-11-30 NOTE — Assessment & Plan Note (Signed)
Last TSH 1.85 12/05/17, continue Levothyroxine 123mcg qd,

## 2018-12-01 ENCOUNTER — Other Ambulatory Visit: Payer: Self-pay | Admitting: Nurse Practitioner

## 2018-12-01 NOTE — Telephone Encounter (Signed)
High risk or very high risk warning populated when attempting to refill medication. RX request sent to PCP for review and approval if warranted.   

## 2018-12-05 ENCOUNTER — Other Ambulatory Visit: Payer: Self-pay

## 2018-12-05 DIAGNOSIS — E039 Hypothyroidism, unspecified: Secondary | ICD-10-CM

## 2018-12-05 DIAGNOSIS — K219 Gastro-esophageal reflux disease without esophagitis: Secondary | ICD-10-CM

## 2018-12-05 DIAGNOSIS — I10 Essential (primary) hypertension: Secondary | ICD-10-CM

## 2018-12-05 DIAGNOSIS — E785 Hyperlipidemia, unspecified: Secondary | ICD-10-CM

## 2018-12-06 ENCOUNTER — Telehealth: Payer: Self-pay | Admitting: Gastroenterology

## 2018-12-06 LAB — LIPID PANEL
Cholesterol: 191 mg/dL (ref <200)
HDL: 48 mg/dL — ABNORMAL LOW (ref >=50)
LDL Cholesterol (Calc): 119 mg/dL (calc) — ABNORMAL HIGH
Non-HDL Cholesterol (Calc): 143 mg/dL (calc) — ABNORMAL HIGH (ref <130)
Total CHOL/HDL Ratio: 4 (calc) (ref <5.0)
Triglycerides: 127 mg/dL (ref <150)

## 2018-12-06 LAB — CBC WITH DIFFERENTIAL/PLATELET
Absolute Monocytes: 410 cells/uL (ref 200–950)
Basophils Absolute: 50 cells/uL (ref 0–200)
Basophils Relative: 0.8 %
Eosinophils Absolute: 271 cells/uL (ref 15–500)
Eosinophils Relative: 4.3 %
HCT: 41.9 % (ref 35.0–45.0)
Hemoglobin: 14 g/dL (ref 11.7–15.5)
Lymphs Abs: 2885 cells/uL (ref 850–3900)
MCH: 30 pg (ref 27.0–33.0)
MCHC: 33.4 g/dL (ref 32.0–36.0)
MCV: 89.7 fL (ref 80.0–100.0)
MPV: 11.6 fL (ref 7.5–12.5)
Monocytes Relative: 6.5 %
Neutro Abs: 2684 cells/uL (ref 1500–7800)
Neutrophils Relative %: 42.6 %
Platelets: 239 10*3/uL (ref 140–400)
RBC: 4.67 10*6/uL (ref 3.80–5.10)
RDW: 12.3 % (ref 11.0–15.0)
Total Lymphocyte: 45.8 %
WBC: 6.3 10*3/uL (ref 3.8–10.8)

## 2018-12-06 LAB — COMPLETE METABOLIC PANEL WITH GFR
AG Ratio: 2 (calc) (ref 1.0–2.5)
ALT: 24 U/L (ref 6–29)
AST: 17 U/L (ref 10–35)
Albumin: 4.3 g/dL (ref 3.6–5.1)
Alkaline phosphatase (APISO): 67 U/L (ref 37–153)
BUN: 14 mg/dL (ref 7–25)
CO2: 27 mmol/L (ref 20–32)
Calcium: 9.6 mg/dL (ref 8.6–10.4)
Chloride: 109 mmol/L (ref 98–110)
Creat: 0.78 mg/dL (ref 0.60–0.88)
GFR, Est African American: 81 mL/min/{1.73_m2} (ref >=60)
GFR, Est Non African American: 70 mL/min/{1.73_m2} (ref >=60)
Globulin: 2.1 g/dL (calc) (ref 1.9–3.7)
Glucose, Bld: 104 mg/dL — ABNORMAL HIGH (ref 65–99)
Potassium: 4.1 mmol/L (ref 3.5–5.3)
Sodium: 144 mmol/L (ref 135–146)
Total Bilirubin: 0.4 mg/dL (ref 0.2–1.2)
Total Protein: 6.4 g/dL (ref 6.1–8.1)

## 2018-12-06 LAB — TSH: TSH: 1.02 mIU/L (ref 0.40–4.50)

## 2018-12-06 NOTE — Telephone Encounter (Signed)
Called patient back and she had generalized c/o of not feeling good. Chronic sore throat, more tired lately than normal and an episode of stomach cramps last night, that is gone today. Stated she just had labs done yesterday to see if her synthroid dosing needed to be changed. I asked her to stay on her Omeprazole-40mg  twice a day and if she has more episodes of stomach cramps/pain to call us back.

## 2018-12-08 ENCOUNTER — Other Ambulatory Visit: Payer: Self-pay

## 2018-12-08 ENCOUNTER — Ambulatory Visit (INDEPENDENT_AMBULATORY_CARE_PROVIDER_SITE_OTHER): Payer: Medicare Other | Admitting: Otolaryngology

## 2018-12-08 ENCOUNTER — Encounter (INDEPENDENT_AMBULATORY_CARE_PROVIDER_SITE_OTHER): Payer: Self-pay | Admitting: Otolaryngology

## 2018-12-08 VITALS — Temp 98.1°F

## 2018-12-08 DIAGNOSIS — K219 Gastro-esophageal reflux disease without esophagitis: Secondary | ICD-10-CM

## 2018-12-08 DIAGNOSIS — J310 Chronic rhinitis: Secondary | ICD-10-CM | POA: Diagnosis not present

## 2018-12-08 NOTE — Progress Notes (Signed)
HPI: Darlene [name] is a 85 y.o. female who returns today for evaluation of postnasal drainage and intermittent sore throats.  Since last visit she has had endoscopy with GI and has been placed on omeprazole 40 mg twice daily.  She complains mostly of a lot of mucus buildup in her throat as well as occasional sore throat.  She denies any trouble eating or swallowing.  She does have history of allergies and uses Flonase 2 sprays each nostril at night as well as azelastine generally in the mornings.  She also uses ayr saline nasal spray.  She has had occasional bleeding from her nose. She is being checked for appropriate thyroid level on Synthroid by herself PCP She has no hoarseness today.  Past Medical History: Past Medical History:  Diagnosis Date  . Anxiety   . Asthma 04/06/2018  . Atrial fibrillation ([place])   . Colon polyps   . Depression   . Hyperlipidemia   . Hypertension   . Hypothyroidism   . Skin cancer   . Sleep apnea   . Stroke [facility]) 2008     Current Medications: Current Outpatient Medications  Medication Sig Dispense Refill  . acetaminophen (TYLENOL) 325 MG tablet Take 650 mg by mouth as needed (for sinus headache).    Marland Kitchen azelastine (ASTELIN) 0.1 % nasal spray Place 2 sprays into both nostrils 2 (two) times daily. 30 mL 5  . beclomethasone (QVAR REDIHALER) 80 MCG/ACT inhaler Inhale 1 puff into the lungs 2 (two) times daily. 1 Inhaler 6  . clopidogrel (PLAVIX) 75 MG tablet TAKE 1 TABLET BY MOUTH EVERY DAY 90 tablet 1  . diazepam (VALIUM) 5 MG tablet Take 1 tablet (5 mg total) by mouth daily. 30 tablet 0  . fluticasone (FLONASE) 50 MCG/ACT nasal spray Place 1 spray into both nostrils daily.    Marland Kitchen levothyroxine (SYNTHROID, LEVOTHROID) 100 MCG tablet Take 100 mcg by mouth daily before breakfast.    . losartan (COZAAR) 100 MG tablet TAKE 1/2 TABLET BY MOUTH EVERY DAY 90 tablet 0  . omeprazole (PRILOSEC) 20 MG capsule Take 1 capsule (20 mg total) by mouth daily. 30 capsule 3  .  omeprazole (PRILOSEC) 40 MG capsule Take 40 mg two times daily for 3 months 180 capsule 0  . pravastatin (PRAVACHOL) 40 MG tablet Take 40 mg by mouth at bedtime.     . sodium chloride (OCEAN) 0.65 % SOLN nasal spray Place 1 spray into both nostrils as needed for congestion.     Current Facility-Administered Medications  Medication Dose Route Frequency Provider Last Rate Last Dose  . 0.9 %  sodium chloride infusion  500 mL Intravenous Once Mansouraty, Telford Nab., MD      .  Past Medical History:  Diagnosis Date  . Anxiety   . Asthma 04/06/2018  . Atrial fibrillation ([place])   . Colon polyps   . Depression   . Hyperlipidemia   . Hypertension   . Hypothyroidism   . Skin cancer   . Sleep apnea   . Stroke [facility]) 2008   Past Surgical History:  Procedure Laterality Date  . ADENOIDECTOMY    . APPENDECTOMY  1960  . [place] SURGERY  2014  . MESH APPLIED TO LAP PORT    . TONSILLECTOMY  1940   Social History   Socioeconomic History  . Marital status: Divorced    Spouse name: Not on file  . Number of children: 3  . Years of education: Not on file  . Highest  education level: Not on file  Occupational History  . Occupation: retired Animal nutritionist  Social Needs  . Financial resource strain: Not on file  . Food insecurity    Worry: Not on file    Inability: Not on file  . Transportation needs    Medical: Not on file    Non-medical: Not on file  Tobacco Use  . Smoking status: Never Smoker  . Smokeless tobacco: Never Used  Substance and Sexual Activity  . Alcohol use: Yes    Alcohol/week: 2.0 standard drinks    Types: 2 Glasses of wine per week  . Drug use: Never  . Sexual activity: Not on file  Lifestyle  . Physical activity    Days per week: Not on file    Minutes per session: Not on file  . Stress: Not on file  Relationships  . Social Herbalist on phone: Not on file    Gets together: Not on file    Attends religious service: Not on file    Active  member of club or organization: Not on file    Attends meetings of clubs or organizations: Not on file    Relationship status: Not on file  Other Topics Concern  . Not on file  Social History Narrative   Social History      Diet? ok      Do you drink/eat things with caffeine? Weak coffee, (much milk)      Marital status?          divorced                          What year were you married? 1961      Do you live in a house, apartment, assisted living, condo, trailer, etc.? apartment      Is it one or more stories? 4      How many persons live in your home? Only me      Do you have any pets in your home? (please list) no      Highest level of education completed? 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             yes                         Type & how often? Walk- would love to return to water exercise      Advanced Directives      Do you have a living will? yes      Do you have a DNR form?           no                       If not, do you want to discuss one? no      Do you have signed POA/HPOA for forms? no      Functional Status      Do you have difficulty bathing or dressing yourself?  no      Do you have difficulty preparing food or eating? no      Do you have difficulty managing your medications? no      Do you have difficulty managing your finances?  no      Do you have difficulty affording your medications?  no      Family History  Problem Relation Age of Onset  . Lung cancer Mother 46       smoker  . Heart disease Mother   . Stroke Father 74  . Allergic rhinitis Sister   . CVA Brother   . Lung cancer Maternal Grandfather   . Heart attack Paternal Grandfather   . Psoriasis Daughter   . Rheum arthritis Daughter   . Bipolar disorder Daughter    Allergies  Allergen Reactions  . Dust Mite Extract   . Oxycontin [Oxycodone Hcl] Nausea And Vomiting  . Pollen Extract    Prior to  Admission medications   Medication Sig Start Date End Date Taking? Authorizing Provider  acetaminophen (TYLENOL) 325 MG tablet Take 650 mg by mouth as needed (for sinus headache).   Yes [provider]  azelastine (ASTELIN) 0.1 % nasal spray Place 2 sprays into both nostrils 2 (two) times daily. 01/31/18  Yes Valentina Shaggy, MD  beclomethasone (QVAR REDIHALER) 80 MCG/ACT inhaler Inhale 1 puff into the lungs 2 (two) times daily. 02/28/18  Yes Valentina Shaggy, MD  clopidogrel (PLAVIX) 75 MG tablet TAKE 1 TABLET BY MOUTH EVERY DAY 12/01/18  Yes Virgie Dad, MD  diazepam (VALIUM) 5 MG tablet Take 1 tablet (5 mg total) by mouth daily. 11/30/18  Yes [name], [name], NP  fluticasone (FLONASE) 50 MCG/ACT nasal spray Place 1 spray into both nostrils daily.   Yes [provider]  levothyroxine (SYNTHROID, LEVOTHROID) 100 MCG tablet Take 100 mcg by mouth daily before breakfast.   Yes [provider]  losartan (COZAAR) 100 MG tablet TAKE 1/2 TABLET BY MOUTH EVERY DAY 09/14/18  Yes [name], [name], NP  omeprazole (PRILOSEC) 20 MG capsule Take 1 capsule (20 mg total) by mouth daily. 09/01/18  Yes Mansouraty, Telford Nab., MD  omeprazole (PRILOSEC) 40 MG capsule Take 40 mg two times daily for 3 months 10/26/18  Yes Mansouraty, Telford Nab., MD  pravastatin (PRAVACHOL) 40 MG tablet Take 40 mg by mouth at bedtime.    Yes [provider]  sodium chloride (OCEAN) 0.65 % SOLN nasal spray Place 1 spray into both nostrils as needed for congestion.   Yes [provider]     Positive ROS: Otherwise negative  All other systems have been reviewed and were otherwise negative with the exception of those mentioned in the HPI and as above.  Physical Exam: Constitutional: Alert, well-appearing, no acute distress Ears: External ears without lesions or tenderness. Ear canals are clear bilaterally with intact, clear TMs. Tuning forks reveal symmetric hearing bilaterally. Nasal:  External nose without lesions.  On intranasal exam she has mild crusting along the septum with small prominent vessels in Kiesselbach's plexus on the left side.  Septum relatively midline she has moderate rhinitis.  Both middle meatus regions are clear.  No polyps.  No signs of infection. Oral: Lips and gums without lesions. Tongue and palate mucosa without lesions. Posterior oropharynx clear. Indirect laryngoscopy revealed a clear base of tongue vallecula and epiglottis.  Vocal cords clear. Neck: No palpable adenopathy or masses Respiratory: Breathing comfortably  Skin: No facial/neck lesions or rash noted.  Procedures  Assessment: Chronic rhinitis Laryngeal pharyngeal reflux  Plan: Would recommend continuation with omeprazole. Recommended use of Astelin 1 spray twice daily and continue with the Flonase. Also recommended regular use of saline irrigation which would be beneficial. Reassured her of normal upper airway examination otherwise.   Radene Journey, MD

## 2018-12-11 ENCOUNTER — Telehealth: Payer: Self-pay | Admitting: *Deleted

## 2018-12-11 NOTE — Telephone Encounter (Signed)
Patient called and stated that she Can't stay awake. Stated that [facility] already knows this but patient feels like it is getting worse.  Patient has an appointment with [facility] on 12/21/2018. Nothing available sooner. Please Advise.

## 2018-12-12 NOTE — Telephone Encounter (Signed)
Patient notified and agreed.  

## 2018-12-12 NOTE — Telephone Encounter (Signed)
[name], [name], NP  You 18 hours ago (3:41 PM)   Recommend the patient to stop taking diazepam. May consider ED eval if mental status/can't stay awake persists. Thanks.    Message text        LMOM to return call.

## 2018-12-21 ENCOUNTER — Encounter: Payer: Self-pay | Admitting: Nurse Practitioner

## 2018-12-21 ENCOUNTER — Non-Acute Institutional Stay: Payer: Medicare Other | Admitting: Nurse Practitioner

## 2018-12-21 ENCOUNTER — Other Ambulatory Visit: Payer: Self-pay

## 2018-12-21 DIAGNOSIS — R400 Somnolence: Secondary | ICD-10-CM

## 2018-12-21 DIAGNOSIS — E039 Hypothyroidism, unspecified: Secondary | ICD-10-CM | POA: Diagnosis not present

## 2018-12-21 DIAGNOSIS — F411 Generalized anxiety disorder: Secondary | ICD-10-CM

## 2018-12-21 DIAGNOSIS — I10 Essential (primary) hypertension: Secondary | ICD-10-CM | POA: Diagnosis not present

## 2018-12-21 DIAGNOSIS — K219 Gastro-esophageal reflux disease without esophagitis: Secondary | ICD-10-CM | POA: Diagnosis not present

## 2018-12-21 MED ORDER — BUSPIRONE HCL 5 MG PO TABS: 2.5000 mg | ORAL_TABLET | Freq: Every day | ORAL | 1 refills | Status: DC

## 2018-12-21 NOTE — Assessment & Plan Note (Signed)
Stable, continue Levothyroxine.  

## 2018-12-21 NOTE — Assessment & Plan Note (Signed)
Continue Omeprazole ?

## 2018-12-21 NOTE — Patient Instructions (Addendum)
F/u in clinic [place] in 2 months. Avoid use of Diazepam due to sleepiness during day. Will try Buspar 2.5mg  qd for anxiety/sleep

## 2018-12-21 NOTE — Progress Notes (Signed)
Location:   clinic [place]   Place of Service:  ALF (13) Provider: Marlana Latus NP  Code Status: DNR Goals of Care: No flowsheet data found.   Chief Complaint  Patient presents with  . Follow-up    3-week followup. Complains of inability lose weight, daytime somnolence. Has a busy mind at night.  . Immunizations    Needs tetanus, Tdap, and PNA. States she is going to [facility] this afternoon for first Shingrix.  . Quality Metric Gaps    Needs DEXA scan noted.     HPI: Patient is a 85 y.o. female seen today for medical management of chronic diseases.    The patient has Hx of hypothyroidism, on Levothyroxine 156mcg qd, last TSH 1.02 12/05/18. GERD, stable, on Omeprazole. HTN, blood pressure is controlled, on Losartan 100mg  qd. Anxiety, prn Diazepam. Hx of CVA taking Plavix, Pravachol.   Past Medical History:  Diagnosis Date  . Anxiety   . Asthma 04/06/2018  . Atrial fibrillation ([place])   . Colon polyps   . Depression   . Hyperlipidemia   . Hypertension   . Hypothyroidism   . Skin cancer   . Sleep apnea   . Stroke [facility]) 2008    Past Surgical History:  Procedure Laterality Date  . ADENOIDECTOMY    . APPENDECTOMY  1960  . [place] SURGERY  2014  . MESH APPLIED TO LAP PORT    . TONSILLECTOMY  1940    Allergies  Allergen Reactions  . Dust Mite Extract   . Oxycontin [Oxycodone Hcl] Nausea And Vomiting  . Pollen Extract     Allergies as of 12/21/2018      Reactions   Dust Mite Extract    Oxycontin [oxycodone Hcl] Nausea And Vomiting   Pollen Extract       Medication List       Accurate as of December 21, 2018 11:59 PM. If you have any questions, ask your nurse or doctor.        STOP taking these medications   beclomethasone 80 MCG/ACT inhaler Commonly known as: Qvar RediHaler Stopped by: Siobahn Worsley X Laverna Dossett, NP     TAKE these medications   acetaminophen 325 MG tablet Commonly known as: TYLENOL Take 650 mg by mouth as needed (for sinus headache).   azelastine 0.1 %  nasal spray Commonly known as: ASTELIN Place 2 sprays into both nostrils 2 (two) times daily.   busPIRone 5 MG tablet Commonly known as: BUSPAR Take 0.5 tablets (2.5 mg total) by mouth daily. Started by: Keyerra Lamere X Fumiye Lubben, NP   clopidogrel 75 MG tablet Commonly known as: PLAVIX TAKE 1 TABLET BY MOUTH EVERY DAY   DIAZEPAM PO Take by mouth as needed. Unsure of dose   fluticasone 50 MCG/ACT nasal spray Commonly known as: FLONASE Place 2 sprays into both nostrils daily.   levothyroxine 100 MCG tablet Commonly known as: SYNTHROID Take 100 mcg by mouth daily before breakfast.   losartan 100 MG tablet Commonly known as: COZAAR TAKE 1/2 TABLET BY MOUTH EVERY DAY   omeprazole 20 MG capsule Commonly known as: PRILOSEC Take 1 capsule (20 mg total) by mouth daily.   omeprazole 40 MG capsule Commonly known as: PRILOSEC Take 40 mg two times daily for 3 months   pravastatin 40 MG tablet Commonly known as: PRAVACHOL Take 40 mg by mouth at bedtime.   sodium chloride 0.65 % Soln nasal spray Commonly known as: OCEAN Place 1 spray into both nostrils as needed for congestion.  Review of Systems:  Review of Systems  Constitutional: Negative for activity change, appetite change, chills, diaphoresis, fatigue and fever.       Day time sleepiness.   HENT: Positive for hearing loss. Negative for congestion and voice change.   Eyes: Positive for visual disturbance.       Left peripheral visual loss both eyes.   Respiratory: Negative for cough, shortness of breath and wheezing.   Cardiovascular: Negative for chest pain, palpitations and leg swelling.  Gastrointestinal: Negative for abdominal distention, abdominal pain, constipation, diarrhea, nausea and vomiting.  Genitourinary: Negative for difficulty urinating and dysuria.  Musculoskeletal: Positive for gait problem.  Skin: Negative for color change and pallor.  Neurological: Negative for dizziness, speech difficulty, weakness and  headaches.  Psychiatric/Behavioral: Positive for dysphoric mood and sleep disturbance. Negative for agitation, behavioral problems and hallucinations. The patient is nervous/anxious.     Health Maintenance  Topic Date Due  . DEXA SCAN  07/17/2000  . TETANUS/TDAP  11/26/2018  . INFLUENZA VACCINE  10/19/2019 (Originally 08/19/2018)  . COLONOSCOPY  10/25/2021  . PNA vac Low Risk Adult  Completed    Physical Exam: Vitals:   12/21/18 1358  BP: 130/78  Pulse: 64  Temp: 97.8 F (36.6 C)  TempSrc: Oral  SpO2: 97%  Weight: 161 lb 12.8 oz (73.4 kg)  Height: 5\' 2"  (1.575 m)   Body mass index is 29.59 kg/m. Physical Exam Vitals signs reviewed.  Constitutional:      General: She is not in acute distress.    Appearance: Normal appearance. She is not ill-appearing, toxic-appearing or diaphoretic.  HENT:     Head: Normocephalic and atraumatic.     Nose: Nose normal.     Mouth/Throat:     Mouth: Mucous membranes are moist.  Eyes:     Extraocular Movements: Extraocular movements intact.     Conjunctiva/sclera: Conjunctivae normal.     Pupils: Pupils are equal, round, and reactive to light.     Comments: Peripheral left visual field loss since CVA 2006  Neck:     Musculoskeletal: Normal range of motion and neck supple.  Cardiovascular:     Rate and Rhythm: Normal rate and regular rhythm.     Heart sounds: No murmur.  Pulmonary:     Breath sounds: No wheezing or rales.  Abdominal:     General: Bowel sounds are normal. There is no distension.     Palpations: Abdomen is soft.     Tenderness: There is no abdominal tenderness. There is no right CVA tenderness, left CVA tenderness, guarding or rebound.  Musculoskeletal:     Right lower leg: No edema.     Left lower leg: No edema.  Skin:    General: Skin is warm and dry.     Coloration: Skin is not jaundiced.     Findings: No bruising or lesion.  Neurological:     General: No focal deficit present.     Mental Status: She is alert  and oriented to person, place, and time. Mental status is at baseline.     Cranial Nerves: Cranial nerve deficit present.     Motor: No weakness.     Coordination: Coordination normal.     Gait: Gait abnormal.     Comments: Left peripheral visual field loss since CVA 2006  Psychiatric:        Mood and Affect: Mood normal.        Behavior: Behavior normal.        Thought  Content: Thought content normal.        Judgment: Judgment normal.     Comments: Repetitive.      Labs reviewed: Basic Metabolic Panel: Recent Labs    12/05/18 0700  NA 144  K 4.1  CL 109  CO2 27  GLUCOSE 104*  BUN 14  CREATININE 0.78  CALCIUM 9.6  TSH 1.02   Liver Function Tests: Recent Labs    12/05/18 0700  AST 17  ALT 24  BILITOT 0.4  PROT 6.4   No results for input(s): LIPASE, AMYLASE in the last 8760 hours. No results for input(s): AMMONIA in the last 8760 hours. CBC: Recent Labs    09/01/18 1139 12/05/18 0700  WBC 7.4 6.3  NEUTROABS  --  2,684  HGB 14.6 14.0  HCT 44.1 41.9  MCV 92.3 89.7  PLT 256.0 239   Lipid Panel: Recent Labs    12/05/18 0700  CHOL 191  HDL 48*  LDLCALC 119*  TRIG 127  CHOLHDL 4.0   No results found for: HGBA1C  Procedures since last visit: No results found.  Assessment/Plan  Essential hypertension Blood pressure is controlled, continue Losartan 100mg  qd.   Gastroesophageal reflux disease Continue Omeprazole.   Acquired hypothyroidism Stable, continue Levothyroxine  Daytime sleepiness Prn Diazepam is contributory, discourage usage of Diazepam. May consider neurology consultation in setting of Hx of CVA  GAD (generalized anxiety disorder) Will discourage usage of Diazepam, Buspar 2.5mg  qd may be beneficiary.    Labs/tests ordered: none  Next appt:  2 months

## 2018-12-21 NOTE — Assessment & Plan Note (Signed)
Blood pressure is controlled, continue Losartan 100mg qd.  

## 2018-12-21 NOTE — Assessment & Plan Note (Signed)
Prn Diazepam is contributory, discourage usage of Diazepam. May consider neurology consultation in setting of Hx of CVA

## 2018-12-21 NOTE — Assessment & Plan Note (Signed)
Will discourage usage of Diazepam, Buspar 2.5mg  qd may be beneficiary.

## 2019-01-02 ENCOUNTER — Encounter: Payer: Self-pay | Admitting: Neurology

## 2019-01-02 ENCOUNTER — Other Ambulatory Visit: Payer: Self-pay

## 2019-01-02 ENCOUNTER — Ambulatory Visit (INDEPENDENT_AMBULATORY_CARE_PROVIDER_SITE_OTHER): Payer: Medicare Other | Admitting: Neurology

## 2019-01-02 VITALS — BP 138/72 | HR 75 | Ht 63.0 in | Wt 162.0 lb

## 2019-01-02 DIAGNOSIS — G4733 Obstructive sleep apnea (adult) (pediatric): Secondary | ICD-10-CM

## 2019-01-02 DIAGNOSIS — R351 Nocturia: Secondary | ICD-10-CM

## 2019-01-02 DIAGNOSIS — G4719 Other hypersomnia: Secondary | ICD-10-CM | POA: Diagnosis not present

## 2019-01-02 DIAGNOSIS — E663 Overweight: Secondary | ICD-10-CM

## 2019-01-02 NOTE — Patient Instructions (Signed)
Thank you for choosing [facility] for your sleep related care! It was nice to meet you today! I appreciate that you entrust me with your sleep related healthcare concerns. I hope, I was able to address at least some of your concerns today, and that I can help you feel reassured and also get better.    Here is what we discussed today and what we came up with as our plan for you:    Based on your symptoms and your exam I believe you still be at risk for obstructive sleep apnea (aka OSA), and I think we should proceed with a sleep study to determine whether you do or do not have OSA and how severe it is. Even, if you have mild OSA, I may want you to consider treatment with CPAP, as treatment of even borderline or mild sleep apnea can result and improvement of symptoms such as sleep disruption, daytime sleepiness, nighttime bathroom breaks, restless leg symptoms, improvement of headache syndromes, even improved mood disorder. We can also consider a consultation with a dentist for treating sleep apnea.  Please remember, the long-term risks and ramifications of untreated moderate to severe obstructive sleep apnea are: increased Cardiovascular disease, including congestive heart failure, stroke, difficult to control hypertension, treatment resistant obesity, arrhythmias, especially irregular heartbeat commonly known as A. Fib. (atrial fibrillation); even type 2 diabetes has been linked to untreated OSA.   Sleep apnea can cause disruption of sleep and sleep deprivation in most cases, which, in turn, can cause recurrent headaches, problems with memory, mood, concentration, focus, and vigilance. Most people with untreated sleep apnea report excessive daytime sleepiness, which can affect their ability to drive. Please do not drive if you feel sleepy. Patients with sleep apnea developed difficulty initiating and maintaining sleep (aka insomnia).   Having sleep apnea may increase your risk for  other sleep disorders, including involuntary behaviors sleep such as sleep terrors, sleep talking, sleepwalking.    Having sleep apnea can also increase your risk for restless leg syndrome and leg movements at night.   Please note that untreated obstructive sleep apnea may carry additional perioperative morbidity. Patients with significant obstructive sleep apnea (typically, in the moderate to severe degree) should receive, if possible, perioperative PAP (positive airway pressure) therapy and the surgeons and particularly the anesthesiologists should be informed of the diagnosis and the severity of the sleep disordered breathing.   I will likely see you back after your sleep study to go over the test results and where to go from there. We will call you after your sleep study to advise about the results (most likely, you will hear from Coleman, my nurse) and to set up an appointment at the time, as necessary.    Our sleep lab administrative assistant will call you to schedule your sleep study and give you further instructions, regarding the check in process for the sleep study, arrival time, what to bring, when you can expect to leave after the study, etc., and to answer any other logistical questions you may have. If you don't hear back from her by about 2 weeks from now, please feel free to call her direct line at [phone] or you can call our general clinic number, or email [place] through My Chart.

## 2019-01-02 NOTE — Progress Notes (Signed)
Subjective:    Patient ID: Darlene [name] is a 85 y.o. female.  HPI     Star Age, MD, PhD [facility] [address], [address]. [place], [place] [address]   Dear [name],  I saw your patient, Darlene [name], upon your kind request in my sleep clinic today for initial consultation of her sleep disorder, in particular, her prior diagnosis of sleep apnea and daytime somnolence reported.  The patient is unaccompanied today.  As you know, Darlene [name] is a 85 year old right-handed woman with an underlying medical history of anxiety, asthma, A. fib, depression, hyperlipidemia, hypertension, skin cancer, stroke, hypothyroidism and overweight state, who reports that she was diagnosed with obstructive sleep apnea several years ago.  She reports having had 2 sleep studies when she was still residing in [place], [place] 1 around 7 years ago, second 1 may be 4 or 5 years ago, prior sleep study results are not available for my review today.  She was advised to proceed with CPAP therapy but reports that she declined it twice.  She does not wish to consider CPAP therapy but would be open to pursuing a sleep study.  Epworth sleepiness score is 12 out of 24.  She has trouble maintaining sleep.  She has been taking Valium 5 mg strength half a pill as needed.  She has been on Valium for years as I understand.  Bedtime is generally between 11 and midnight and rise time around.  She sets an alarm.  She has nocturia at least once per average night.  She also wakes up with nasal congestion and uses allergy medicine as well as saline nasal spray.  She lives alone, non-smoker and drinks alcohol rarely, no daily caffeine.  Her Past Medical History Is Significant For: Past Medical History:  Diagnosis Date  . Anxiety   . Asthma 04/06/2018  . Atrial fibrillation ([place])   . Colon polyps   . Depression   . Hyperlipidemia   . Hypertension   . Hypothyroidism   . Skin cancer   . Sleep apnea    . Stroke [facility]) 2008    Her Past Surgical History Is Significant For: Past Surgical History:  Procedure Laterality Date  . ADENOIDECTOMY    . APPENDECTOMY  1960  . [place] SURGERY  2014  . MESH APPLIED TO LAP PORT    . TONSILLECTOMY  1940    Her Family History Is Significant For: Family History  Problem Relation Age of Onset  . Lung cancer Mother 54       smoker  . Heart disease Mother   . Stroke Father 48  . Allergic rhinitis Sister   . CVA Brother   . Lung cancer Maternal Grandfather   . Heart attack Paternal Grandfather   . Psoriasis Daughter   . Rheum arthritis Daughter   . Bipolar disorder Daughter     Her Social History Is Significant For: Social History   Socioeconomic History  . Marital status: Divorced    Spouse name: Not on file  . Number of children: 3  . Years of education: Not on file  . Highest education level: Not on file  Occupational History  . Occupation: retired Animal nutritionist  Tobacco Use  . Smoking status: Never Smoker  . Smokeless tobacco: Never Used  Substance and Sexual Activity  . Alcohol use: Yes    Alcohol/week: 2.0 standard drinks    Types: 2 Glasses of wine per week  . Drug use:  Never  . Sexual activity: Not on file  Other Topics Concern  . Not on file  Social History Narrative   Social History      Diet? ok      Do you drink/eat things with caffeine? Weak coffee, (much milk)      Marital status?          divorced                          What year were you married? 1961      Do you live in a house, apartment, assisted living, condo, trailer, etc.? apartment      Is it one or more stories? 4      How many persons live in your home? Only me      Do you have any pets in your home? (please list) no      Highest level of education completed? 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             yes                         Type & how often? Walk-  would love to return to water exercise      Advanced Directives      Do you have a living will? yes      Do you have a DNR form?           no                       If not, do you want to discuss one? no      Do you have signed POA/HPOA for forms? no      Functional Status      Do you have difficulty bathing or dressing yourself?  no      Do you have difficulty preparing food or eating? no      Do you have difficulty managing your medications? no      Do you have difficulty managing your finances?  no      Do you have difficulty affording your medications? no      Social Determinants of Health   Financial Resource Strain:   . Difficulty of Paying Living Expenses: Not on file  Food Insecurity:   . Worried About Charity fundraiser in the Last Year: Not on file  . Ran Out of Food in the Last Year: Not on file  Transportation Needs:   . Lack of Transportation (Medical): Not on file  . Lack of Transportation (Non-Medical): Not on file  Physical Activity:   . Days of Exercise per Week: Not on file  . Minutes of Exercise per Session: Not on file  Stress:   . Feeling of Stress : Not on file  Social Connections:   . Frequency of Communication with Friends and Family: Not on file  . Frequency of Social Gatherings with Friends and Family: Not on file  . Attends Religious Services: Not on file  . Active Member of Clubs or Organizations: Not on file  . Attends Archivist Meetings: Not on file  . Marital Status: Not on file    Her Allergies Are:  Allergies  Allergen Reactions  . Dust Mite Extract   . Oxycontin [  Oxycodone Hcl] Nausea And Vomiting  . Pollen Extract   :   Her Current Medications Are:  Outpatient Encounter Medications as of 01/02/2019  Medication Sig  . acetaminophen (TYLENOL) 325 MG tablet Take 650 mg by mouth as needed (for sinus headache).  Marland Kitchen azelastine (ASTELIN) 0.1 % nasal spray Place 2 sprays into both nostrils 2 (two) times daily.  .  busPIRone (BUSPAR) 5 MG tablet Take 0.5 tablets (2.5 mg total) by mouth daily.  . clopidogrel (PLAVIX) 75 MG tablet TAKE 1 TABLET BY MOUTH EVERY DAY  . DIAZEPAM PO Take by mouth as needed. Unsure of dose  . fluticasone (FLONASE) 50 MCG/ACT nasal spray Place 2 sprays into both nostrils daily.   Marland Kitchen levothyroxine (SYNTHROID, LEVOTHROID) 100 MCG tablet Take 100 mcg by mouth daily before breakfast.  . losartan (COZAAR) 100 MG tablet TAKE 1/2 TABLET BY MOUTH EVERY DAY  . omeprazole (PRILOSEC) 40 MG capsule Take 40 mg two times daily for 3 months  . pravastatin (PRAVACHOL) 40 MG tablet Take 40 mg by mouth at bedtime.   . sodium chloride (OCEAN) 0.65 % SOLN nasal spray Place 1 spray into both nostrils as needed for congestion.  . [DISCONTINUED] omeprazole (PRILOSEC) 20 MG capsule Take 1 capsule (20 mg total) by mouth daily.   Facility-Administered Encounter Medications as of 01/02/2019  Medication  . 0.9 %  sodium chloride infusion  :  Review of Systems:  Out of a complete 14 point review of systems, all are reviewed and negative with the exception of these symptoms as listed below: Review of Systems  Neurological:       Pt presents today to discuss her sleep. Pt has had several sleep studies that diagnosed her with sleep apnea. She refuses to use a cpap. She also wants to discuss a stroke she had 14 years ago.  Epworth Sleepiness Scale 0= would never doze 1= slight chance of dozing 2= moderate chance of dozing 3= high chance of dozing  Sitting and reading: 2 Watching TV: 2 Sitting inactive in a public place (ex. Theater or meeting): 1 As a passenger in a car for an hour without a break: 2 Lying down to rest in the afternoon: 3 Sitting and talking to someone: 1 Sitting quietly after lunch (no alcohol): 1 In a car, while stopped in traffic: 0 Total: 12    Objective:  Neurological Exam  Physical Exam Physical Examination:   Vitals:   01/02/19 1118  BP: 138/72  Pulse: 75    General Examination: The patient is a very pleasant 85 y.o. female in no acute distress. She appears well-developed and well-nourished and well groomed.   HEENT: Normocephalic, atraumatic, pupils are equal, round and reactive to light, extraocular tracking is good without limitation to gaze excursion or nystagmus noted. Hearing is grossly intact. Face is symmetric with normal facial animation. Speech is clear with no dysarthria noted. There is no hypophonia. There is no lip, neck/head, jaw or voice tremor. Neck is supple with full range of passive and active motion. There are no carotid bruits on auscultation. Oropharynx exam reveals: mild mouth dryness, adequate dental hygiene and mild airway crowding, due to smaller airway entry.   Chest: Clear to auscultation without wheezing, rhonchi or crackles noted.  Heart: S1+S2+0, regular and normal without murmurs, rubs or gallops noted.   Abdomen: Soft, non-tender and non-distended with normal bowel sounds appreciated on auscultation.  Extremities: There is no pitting edema in the distal lower extremities bilaterally.   Skin: Warm  and dry without trophic changes noted.   Musculoskeletal: exam reveals no obvious joint deformities, tenderness or joint swelling or erythema.   Neurologically:  Mental status: The patient is awake, alert and oriented in all 4 spheres. Her immediate and remote memory, attention, language skills and fund of knowledge are appropriate. There is no evidence of aphasia, agnosia, apraxia or anomia. Speech is clear with normal prosody and enunciation. Thought process is linear. Mood is normal and affect is normal.  Cranial nerves II - XII are as described above under HEENT exam.  Motor exam: Normal bulk, strength and tone is noted. There is no tremor, Romberg is negative. Reflexes are 2+ throughout. Fine motor skills and coordination: grossly intact.  Cerebellar testing: No dysmetria or intention tremor. There is no truncal or  gait ataxia.  Sensory exam: intact to light touch in the upper and lower extremities.  Gait, station and balance: She stands easily. No veering to one side is noted. No leaning to one side is noted. Posture is age-appropriate and stance is narrow based. Gait shows normal stride length and normal pace. No problems turning are noted.   Assessment and Plan:  In summary, Darlene [name] is a very pleasant 85 y.o.-year old female with an underlying medical history of anxiety, asthma, A. fib, depression, hyperlipidemia, hypertension, skin cancer, stroke, hypothyroidism and overweight state, who Presents for evaluation of her sleep disorder.  She carries a prior diagnosis of obstructive sleep apnea and declined CPAP therapy in the past.  She does report daytime somnolence.  We talked about maintaining good sleep hygiene.  We talked about the importance of treating sleep apnea not only for long-term risk reduction but also for symptom control as an improving daytime somnolence and nocturia. She is advised to proceed with a sleep study.  She is willing to proceed but indicates that she would not want to be on a CPAP machine.  I suggested we wait out test results and consider a dental referral for consideration of a oral appliance. She was recently advised to reduce or eliminate her Valium as I understand, she was to consider BuSpar for anxiety management. She indicates that she takes Valium 5 mg strength half a pill as needed. I plan to see her back after sleep study testing.  I answered all her questions today and she was in agreement.   Thank you very much for allowing me to participate in the care of this nice patient. If I can be of any further assistance to you please do not hesitate to call me at [phone].  Sincerely,   Star Age, MD, PhD

## 2019-01-03 ENCOUNTER — Telehealth: Payer: Self-pay

## 2019-01-03 NOTE — Telephone Encounter (Signed)
The patient stated she had been to see the neurologist and said the neurologist disagreed with all the things that you had done.  Patient does not want to see this neurologist again and would like to talk to you about possibly referring her to another neurologist.  [phone].

## 2019-01-04 ENCOUNTER — Telehealth: Payer: Self-pay | Admitting: *Deleted

## 2019-01-04 ENCOUNTER — Telehealth: Payer: Self-pay

## 2019-01-04 NOTE — Telephone Encounter (Signed)
Patient called requesting to speak with [facility] directly. Stated that she saw the Neurologist and it was a waste of time. Stated that the Neurologist didn't even know why she was there. Patient is requesting to speak to you directly, wants you to call [phone]

## 2019-01-04 NOTE — Telephone Encounter (Signed)
Discussed with the patient Darlene [name] would like for her to come down to clinic today to speak with her about her recent visit to neurologist.   Patient agreed to visit clinic before 5p today.

## 2019-01-04 NOTE — Telephone Encounter (Signed)
[name], [name], NP  You 39 minutes ago (1:34 PM)   I will call her.    Message text

## 2019-01-08 NOTE — Telephone Encounter (Signed)
Called patient to followup. She stated she had had a conversation with Man Darlina Rumpf and that she would decide if she would see another neurologist within the practice, or would try a different practice.

## 2019-01-18 ENCOUNTER — Telehealth: Payer: Self-pay | Admitting: *Deleted

## 2019-01-18 NOTE — Telephone Encounter (Signed)
The patient was discouraged to take prn Diazepam, started trial of Buspirone for anxiety/insomnia. She  desires to stop taking Buspirone for now, stated it doesn't do anything for her. Will let us know if she feels there is something else we can do for her.

## 2019-01-18 NOTE — Telephone Encounter (Signed)
Patient called and stated that she wants to speak with [facility] regarding the Buspirone. Stated that it is NOT working and it is not worth renewing. Stated that she is going out of town on Monday to visit with her husband that is not doing good.  Please Call # [phone]

## 2019-01-21 ENCOUNTER — Other Ambulatory Visit: Payer: Self-pay | Admitting: Gastroenterology

## 2019-01-21 DIAGNOSIS — R1310 Dysphagia, unspecified: Secondary | ICD-10-CM

## 2019-02-13 ENCOUNTER — Other Ambulatory Visit: Payer: Self-pay | Admitting: Nurse Practitioner

## 2019-02-13 DIAGNOSIS — R400 Somnolence: Secondary | ICD-10-CM

## 2019-02-16 ENCOUNTER — Other Ambulatory Visit: Payer: Self-pay

## 2019-02-16 ENCOUNTER — Ambulatory Visit (INDEPENDENT_AMBULATORY_CARE_PROVIDER_SITE_OTHER): Payer: Medicare Other | Admitting: Otolaryngology

## 2019-02-16 VITALS — Temp 98.1°F

## 2019-02-16 DIAGNOSIS — J310 Chronic rhinitis: Secondary | ICD-10-CM

## 2019-02-16 DIAGNOSIS — R040 Epistaxis: Secondary | ICD-10-CM | POA: Diagnosis not present

## 2019-02-16 NOTE — Progress Notes (Signed)
HPI: Darlene [name] is a 86 y.o. female who returns today for evaluation of nasal sinus symptoms with crusting and occasional bleeding from her nose when she blows her nose.  She is status post upper endoscopy and is taking medication that helps with her throat symptoms.  She is living at friend's home.  She has been using Flonase.  She has intermittent nasal congestion..  Past Medical History:  Diagnosis Date  . Anxiety   . Asthma 04/06/2018  . Atrial fibrillation ([place])   . Colon polyps   . Depression   . Hyperlipidemia   . Hypertension   . Hypothyroidism   . Skin cancer   . Sleep apnea   . Stroke [facility]) 2008   Past Surgical History:  Procedure Laterality Date  . ADENOIDECTOMY    . APPENDECTOMY  1960  . [place] SURGERY  2014  . MESH APPLIED TO LAP PORT    . TONSILLECTOMY  1940   Social History   Socioeconomic History  . Marital status: Divorced    Spouse name: Not on file  . Number of children: 3  . Years of education: Not on file  . Highest education level: Not on file  Occupational History  . Occupation: retired Animal nutritionist  Tobacco Use  . Smoking status: Never Smoker  . Smokeless tobacco: Never Used  Substance and Sexual Activity  . Alcohol use: Yes    Alcohol/week: 2.0 standard drinks    Types: 2 Glasses of wine per week  . Drug use: Never  . Sexual activity: Not on file  Other Topics Concern  . Not on file  Social History Narrative   Social History      Diet? ok      Do you drink/eat things with caffeine? Weak coffee, (much milk)      Marital status?          divorced                          What year were you married? 1961      Do you live in a house, apartment, assisted living, condo, trailer, etc.? apartment      Is it one or more stories? 4      How many persons live in your home? Only me      Do you have any pets in your home? (please list) no      Highest level of education completed? 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             yes                         Type & how often? Walk- would love to return to water exercise      Advanced Directives      Do you have a living will? yes      Do you have a DNR form?           no                       If not, do you want to discuss one? no      Do you have signed POA/HPOA for forms? no      Functional Status      Do you have  difficulty bathing or dressing yourself?  no      Do you have difficulty preparing food or eating? no      Do you have difficulty managing your medications? no      Do you have difficulty managing your finances?  no      Do you have difficulty affording your medications? no      Social Determinants of Health   Financial Resource Strain:   . Difficulty of Paying Living Expenses: Not on file  Food Insecurity:   . Worried About Charity fundraiser in the Last Year: Not on file  . Ran Out of Food in the Last Year: Not on file  Transportation Needs:   . Lack of Transportation (Medical): Not on file  . Lack of Transportation (Non-Medical): Not on file  Physical Activity:   . Days of Exercise per Week: Not on file  . Minutes of Exercise per Session: Not on file  Stress:   . Feeling of Stress : Not on file  Social Connections:   . Frequency of Communication with Friends and Family: Not on file  . Frequency of Social Gatherings with Friends and Family: Not on file  . Attends Religious Services: Not on file  . Active Member of Clubs or Organizations: Not on file  . Attends Archivist Meetings: Not on file  . Marital Status: Not on file   Family History  Problem Relation Age of Onset  . Lung cancer Mother 29       smoker  . Heart disease Mother   . Stroke Father 75  . Allergic rhinitis Sister   . CVA Brother   . Lung cancer Maternal Grandfather   . Heart attack Paternal Grandfather   . Psoriasis Daughter   . Rheum arthritis Daughter   . Bipolar  disorder Daughter    Allergies  Allergen Reactions  . Dust Mite Extract   . Oxycontin [Oxycodone Hcl] Nausea And Vomiting  . Pollen Extract    Prior to Admission medications   Medication Sig Start Date End Date Taking? Authorizing Provider  acetaminophen (TYLENOL) 325 MG tablet Take 650 mg by mouth as needed (for sinus headache).   Yes [provider]  azelastine (ASTELIN) 0.1 % nasal spray Place 2 sprays into both nostrils 2 (two) times daily. 01/31/18  Yes Valentina Shaggy, MD  busPIRone (BUSPAR) 5 MG tablet TAKE 1/2 TABLET BY MOUTH DAILY 02/13/19  Yes [name], [name], NP  clopidogrel (PLAVIX) 75 MG tablet TAKE 1 TABLET BY MOUTH EVERY DAY 12/01/18  Yes Virgie Dad, MD  DIAZEPAM PO Take by mouth as needed. Unsure of dose   Yes [provider]  fluticasone (FLONASE) 50 MCG/ACT nasal spray Place 2 sprays into both nostrils daily.    Yes [provider]  levothyroxine (SYNTHROID, LEVOTHROID) 100 MCG tablet Take 100 mcg by mouth daily before breakfast.   Yes [provider]  losartan (COZAAR) 100 MG tablet TAKE 1/2 TABLET BY MOUTH EVERY DAY 09/14/18  Yes [name], [name], NP  omeprazole (PRILOSEC) 40 MG capsule TAKE 1 CAPSULE BY MOUTH TWICE A DAY FOR 3 MONTHS 01/22/19  Yes Mansouraty, Telford Nab., MD  pravastatin (PRAVACHOL) 40 MG tablet Take 40 mg by mouth at bedtime.    Yes [provider]  sodium chloride (OCEAN) 0.65 % SOLN nasal spray Place 1 spray into both nostrils as needed for congestion.   Yes [provider]     Positive ROS: Otherwise  negative  All other systems have been reviewed and were otherwise negative with the exception of those mentioned in the HPI and as above.  Physical Exam: Constitutional: Alert, well-appearing, no acute distress Ears: External ears without lesions or tenderness. Ear canals are clear bilaterally with intact, clear TMs.  Nasal: External nose without lesions. Septum with minimal deformity.  She has  crusting and scabbing on both sides of the septum..  Mild rhinitis bilaterally.  Both middle meatus regions are clear with no signs of infection.  No polyps noted. Oral: Lips and gums without lesions. Tongue and palate mucosa without lesions. Posterior oropharynx clear. Neck: No palpable adenopathy or masses Respiratory: Breathing comfortably  Skin: No facial/neck lesions or rash noted.  Procedures  Assessment: Mild septal deformity with mild rhinitis. Crusting along the septum on both sides.  Plan: Recommended use of Nasacort or Flonase to help with the nasal congestion when she is having nasal congestion although she is doing little bit better today. Also prescribed mupirocin 2% ointment to use on the nose for the next week or 2 if she is having any crusting or bleeding. She will follow-up as needed.   Radene Journey, MD

## 2019-02-20 ENCOUNTER — Emergency Department (HOSPITAL_COMMUNITY)
Admission: EM | Admit: 2019-02-20 | Discharge: 2019-02-21 | Disposition: A | Discharge status: Home / Self Care | Payer: Medicare Other | Attending: Emergency Medicine | Admitting: Emergency Medicine

## 2019-02-20 ENCOUNTER — Other Ambulatory Visit: Payer: Self-pay

## 2019-02-20 ENCOUNTER — Emergency Department (HOSPITAL_COMMUNITY): Payer: Medicare Other

## 2019-02-20 ENCOUNTER — Encounter (HOSPITAL_COMMUNITY): Payer: Self-pay

## 2019-02-20 DIAGNOSIS — I10 Essential (primary) hypertension: Secondary | ICD-10-CM | POA: Diagnosis not present

## 2019-02-20 DIAGNOSIS — Z85828 Personal history of other malignant neoplasm of skin: Secondary | ICD-10-CM | POA: Insufficient documentation

## 2019-02-20 DIAGNOSIS — Z20822 Contact with and (suspected) exposure to covid-19: Secondary | ICD-10-CM | POA: Insufficient documentation

## 2019-02-20 DIAGNOSIS — E039 Hypothyroidism, unspecified: Secondary | ICD-10-CM | POA: Diagnosis not present

## 2019-02-20 DIAGNOSIS — Z79899 Other long term (current) drug therapy: Secondary | ICD-10-CM | POA: Diagnosis not present

## 2019-02-20 DIAGNOSIS — M545 Low back pain, unspecified: Secondary | ICD-10-CM

## 2019-02-20 DIAGNOSIS — Z7901 Long term (current) use of anticoagulants: Secondary | ICD-10-CM | POA: Insufficient documentation

## 2019-02-20 DIAGNOSIS — R509 Fever, unspecified: Secondary | ICD-10-CM | POA: Diagnosis not present

## 2019-02-20 DIAGNOSIS — Z8673 Personal history of transient ischemic attack (TIA), and cerebral infarction without residual deficits: Secondary | ICD-10-CM | POA: Diagnosis not present

## 2019-02-20 LAB — COMPREHENSIVE METABOLIC PANEL
ALT: 17 U/L (ref 0–44)
AST: 17 U/L (ref 15–41)
Albumin: 3.9 g/dL (ref 3.5–5.0)
Alkaline Phosphatase: 72 U/L (ref 38–126)
Anion gap: 8 (ref 5–15)
BUN: 13 mg/dL (ref 8–23)
CO2: 24 mmol/L (ref 22–32)
Calcium: 9.3 mg/dL (ref 8.9–10.3)
Chloride: 107 mmol/L (ref 98–111)
Creatinine, Ser: 0.96 mg/dL (ref 0.44–1.00)
GFR calc Af Amer: >60 mL/min (ref >60)
GFR calc non Af Amer: 55 mL/min — ABNORMAL LOW (ref >60)
Glucose, Bld: 117 mg/dL — ABNORMAL HIGH (ref 70–99)
Potassium: 4 mmol/L (ref 3.5–5.1)
Sodium: 139 mmol/L (ref 135–145)
Total Bilirubin: 0.7 mg/dL (ref 0.3–1.2)
Total Protein: 6.9 g/dL (ref 6.5–8.1)

## 2019-02-20 LAB — URINALYSIS, ROUTINE W REFLEX MICROSCOPIC
Bacteria, UA: NONE SEEN
Bilirubin Urine: NEGATIVE
Glucose, UA: NEGATIVE mg/dL
Hgb urine dipstick: NEGATIVE
Ketones, ur: NEGATIVE mg/dL
Nitrite: NEGATIVE
Protein, ur: NEGATIVE mg/dL
Specific Gravity, Urine: 1.02 (ref 1.005–1.030)
pH: 7 (ref 5.0–8.0)

## 2019-02-20 LAB — CBC WITH DIFFERENTIAL/PLATELET
Abs Immature Granulocytes: 0.02 10*3/uL (ref 0.00–0.07)
Basophils Absolute: 0 10*3/uL (ref 0.0–0.1)
Basophils Relative: 1 %
Eosinophils Absolute: 0 10*3/uL (ref 0.0–0.5)
Eosinophils Relative: 1 %
HCT: 43.1 % (ref 36.0–46.0)
Hemoglobin: 14 g/dL (ref 12.0–15.0)
Immature Granulocytes: 0 %
Lymphocytes Relative: 17 %
Lymphs Abs: 1.1 10*3/uL (ref 0.7–4.0)
MCH: 29.8 pg (ref 26.0–34.0)
MCHC: 32.5 g/dL (ref 30.0–36.0)
MCV: 91.7 fL (ref 80.0–100.0)
Monocytes Absolute: 0.6 10*3/uL (ref 0.1–1.0)
Monocytes Relative: 9 %
Neutro Abs: 4.6 10*3/uL (ref 1.7–7.7)
Neutrophils Relative %: 72 %
Platelets: 235 10*3/uL (ref 150–400)
RBC: 4.7 MIL/uL (ref 3.87–5.11)
RDW: 12.8 % (ref 11.5–15.5)
WBC: 6.4 10*3/uL (ref 4.0–10.5)
nRBC: 0 % (ref 0.0–0.2)

## 2019-02-20 LAB — RESPIRATORY PANEL BY RT PCR (FLU A&B, COVID)
Influenza A by PCR: NEGATIVE
Influenza B by PCR: NEGATIVE
SARS Coronavirus 2 by RT PCR: NEGATIVE

## 2019-02-20 LAB — LACTIC ACID, PLASMA: Lactic Acid, Venous: 1.1 mmol/L (ref 0.5–1.9)

## 2019-02-20 IMAGING — MR MR LUMBAR SPINE WO/W CM
4 of 7 series · 19 of 48 positions shown · IV contrast (Yes)
Comparison: None.

CLINICAL DATA: Low back pain and fever

EXAM:
MRI LUMBAR SPINE WITHOUT AND WITH CONTRAST
TECHNIQUE: Multiplanar and multiecho pulse sequences of the lumbar spine were
obtained without and with intravenous contrast.
CONTRAST:  7.5mL GADAVIST GADOBUTROL 1 MMOL/ML IV SOLN

[Series 3: T1 · sagittal · 5.0mm · 0.51mm/px · 3 of 14 slices shown (1 of 2)]
[im 1/14]
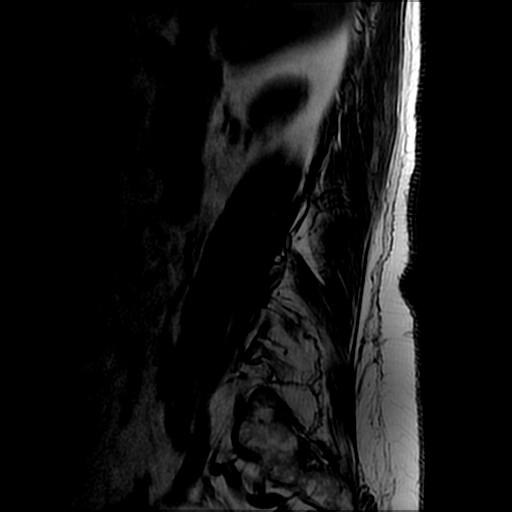
[im 7/14]
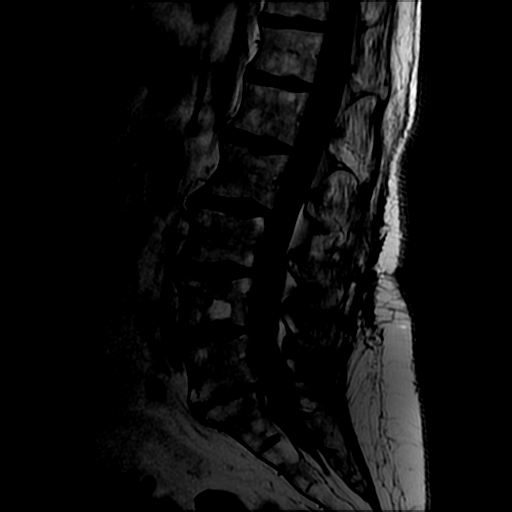
[im 14/14]
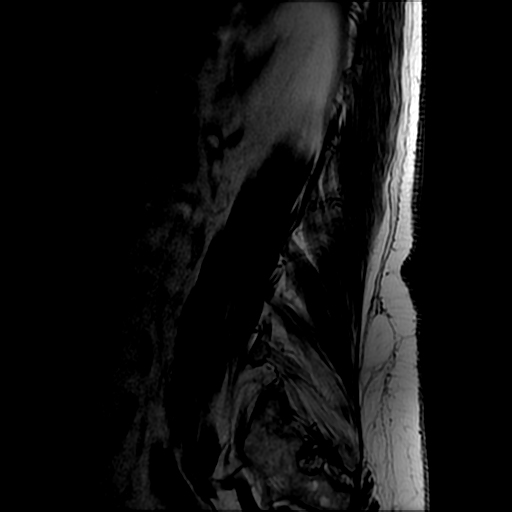

[Series 5: T2 post-contrast · sagittal · 5.0mm · 0.51mm/px · 4 of 14 slices shown]
[im 1/14]
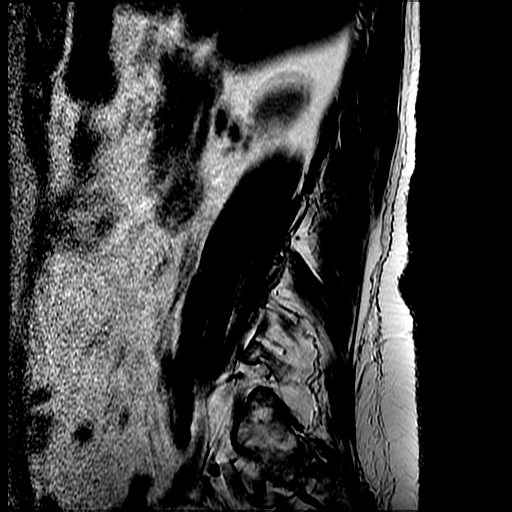
[im 5/14]
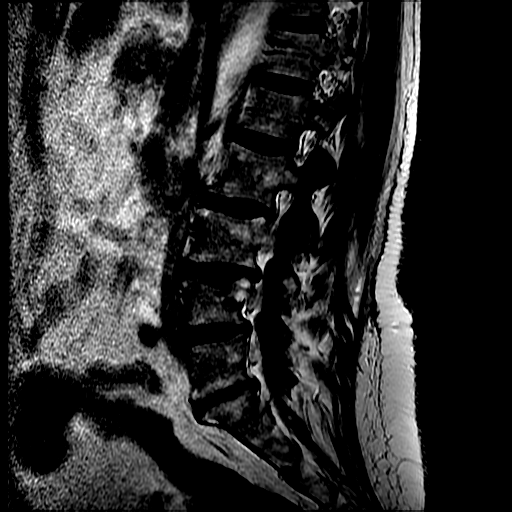
[im 9/14]
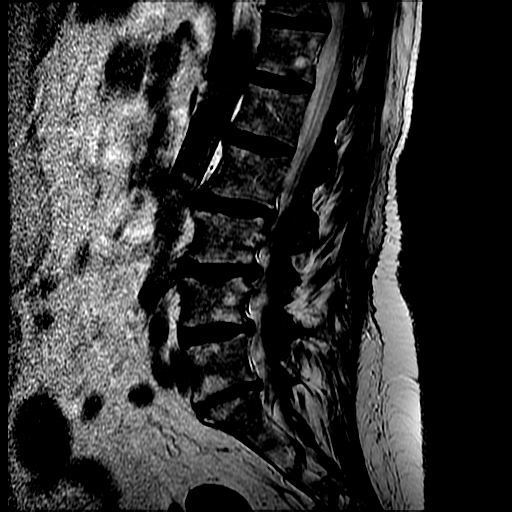
[im 14/14]
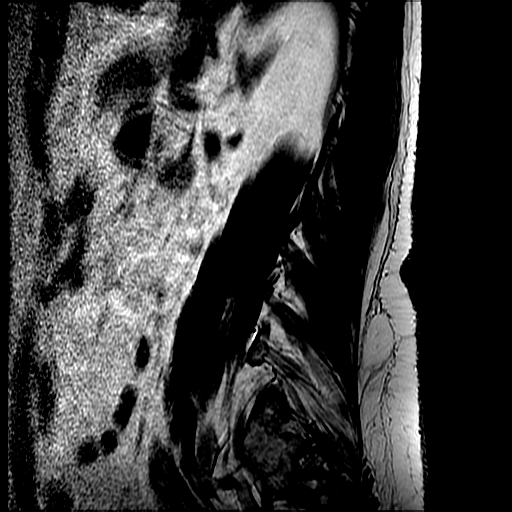

[Series 6: T2 · axial · 5.0mm · 0.39mm/px · z∈[-40,+155]mm · 9 of 40 slices shown]
[im 1/40]
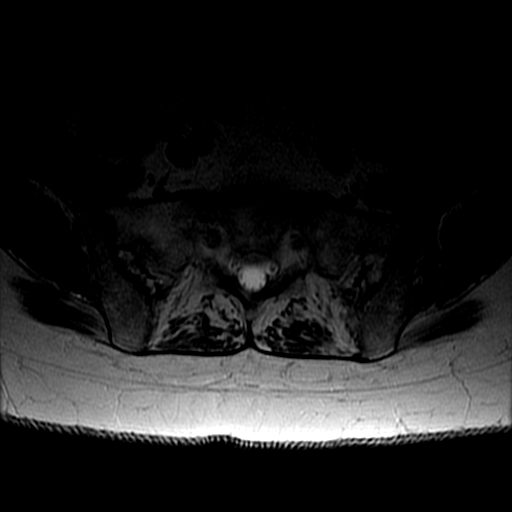
[im 4/40]
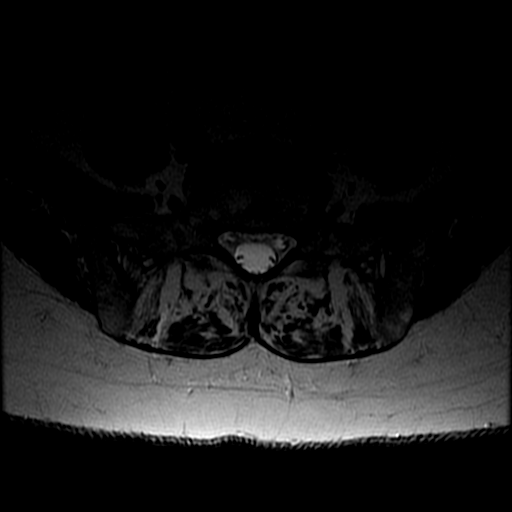
[im 8/40]
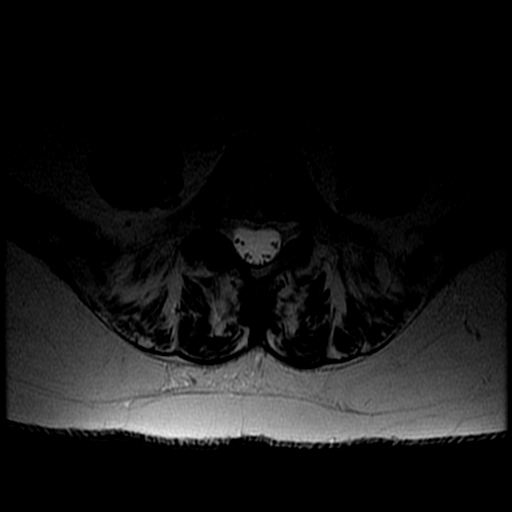
[im 12/40]
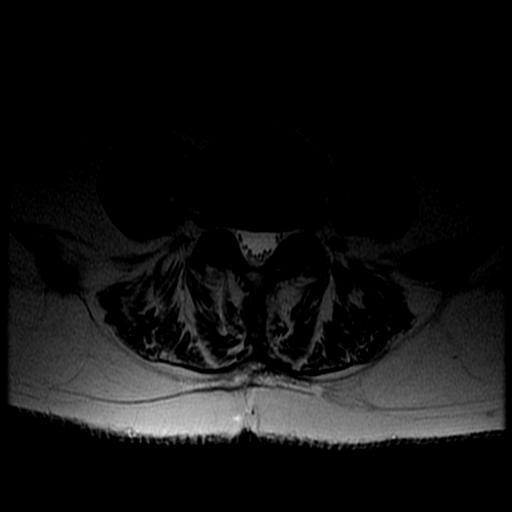
[im 16/40]
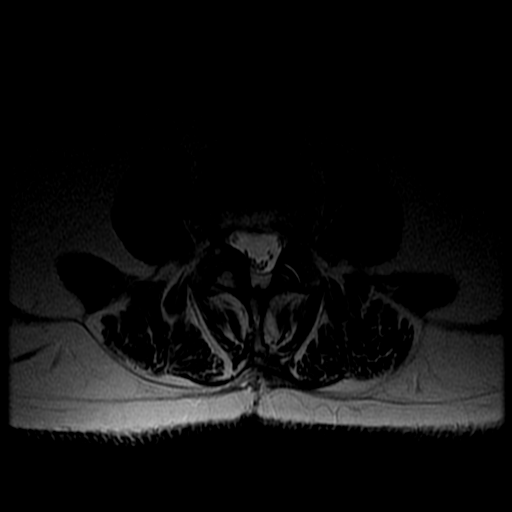
[im 20/40]
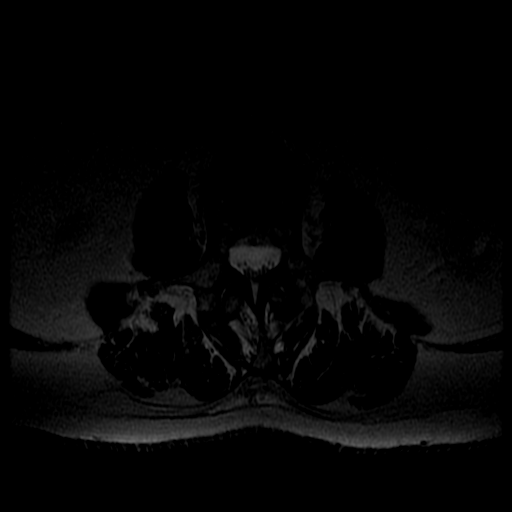
[im 24/40]
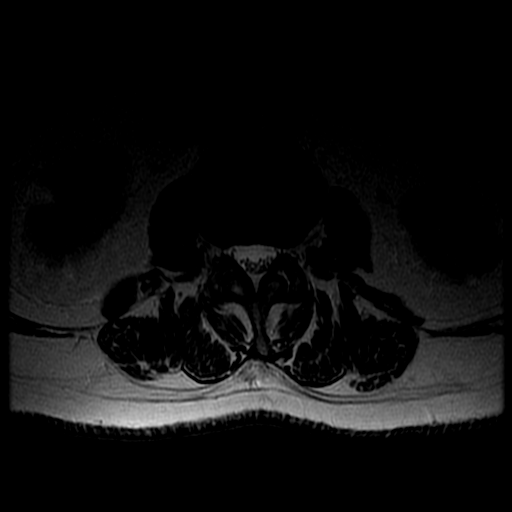
[im 28/40]
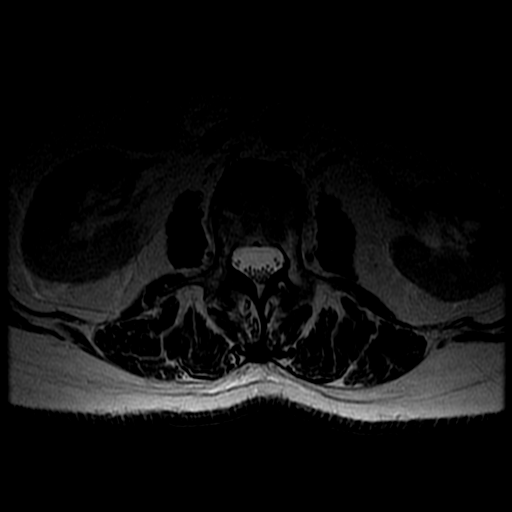
[im 36/40]
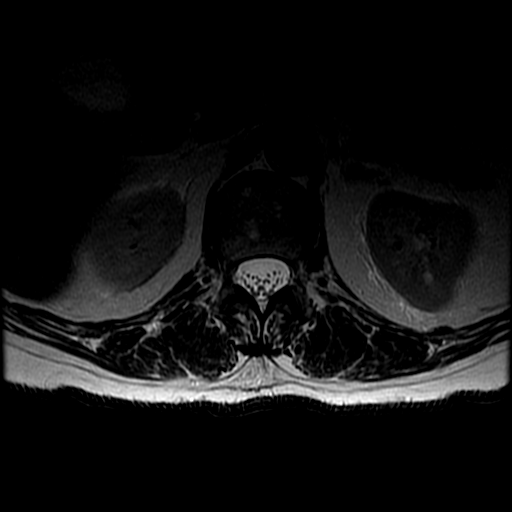

[Series 7: T1 · axial · 5.0mm · 0.39mm/px · z∈[-25,+155]mm · 3 of 40 slices shown (2 of 2)]
[im 4/40]
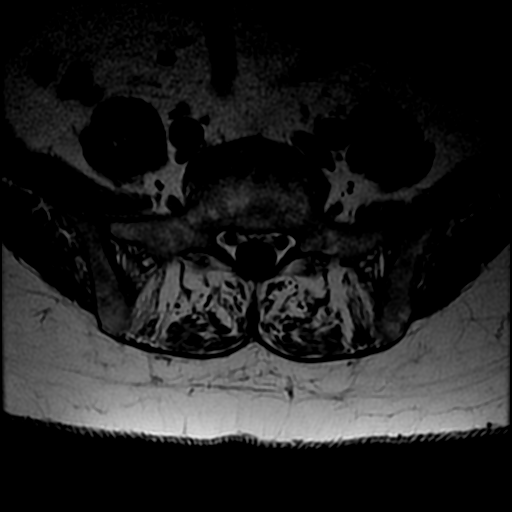
[im 20/40]
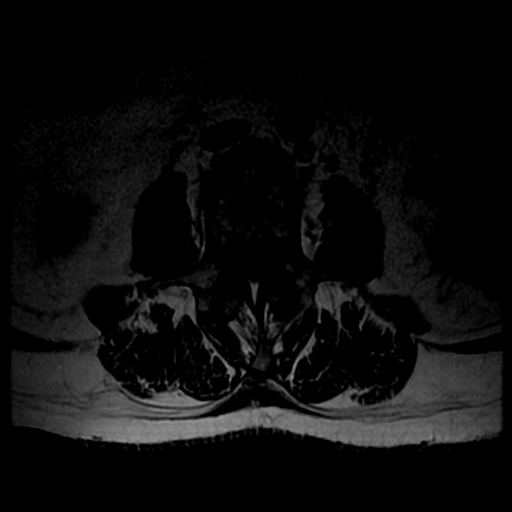
[im 36/40]
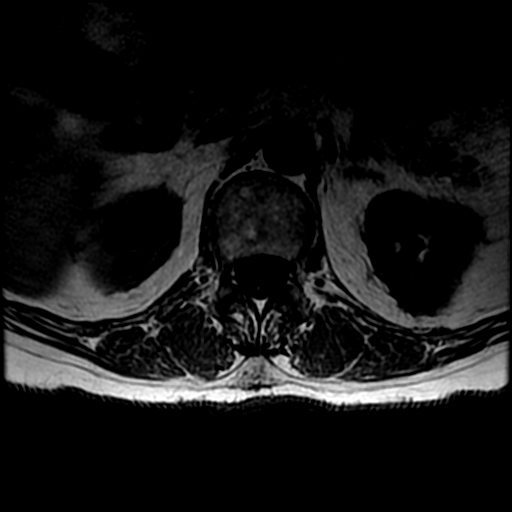

[19 of 48 positions shown; findings below may reference images not displayed]

FINDINGS: Segmentation:  Standard.

Alignment:  Physiologic.

Vertebrae: No fracture, evidence of discitis, or bone lesion.
Nonspecific heterogeneous bone marrow signal.

Conus medullaris and cauda equina: Conus extends to the L1 level.
Conus and cauda equina appear normal.

Paraspinal and other soft tissues: Negative

Disc levels:

T12-L1: Normal disc space and facet joints. There is no spinal canal
stenosis. No neural foraminal stenosis.

L1-L2: Normal disc space and facet joints. There is no spinal canal
stenosis. No neural foraminal stenosis.

L2-L3: Disc desiccation with minimal bulge. Normal facets. There is
no spinal canal stenosis. No neural foraminal stenosis.

L3-L4: Disc desiccation and mild bulge. Normal facets. There is no
spinal canal stenosis. Mild bilateral neural foraminal stenosis.

L4-L5: Mild disc bulge with endplate spurring . No spinal canal
stenosis. Mild bilateral neural foraminal stenosis.

L5-S1: Right asymmetric disc bulge. Normal facets. There is no
spinal canal stenosis. Moderate right neural foraminal stenosis. The
disc contacts the exiting right L5 nerve root.

Visualized sacrum: Normal.
IMPRESSION: 1. No epidural collection or discitis/osteomyelitis.
2. Moderate right L5-S1 neural foraminal stenosis with suspected
contact of the exiting right L5 nerve root. Correlate for right L5
radiculopathy.
3. Mild bilateral L3-L4 and L4-L5 neural foraminal stenosis.
4. Heterogeneous bone marrow signal, nonspecific, variably seen in
the context of smoking or chronic anemia.

## 2019-02-20 IMAGING — DX DG CHEST 1V PORT
1 series · 1 of 1 positions shown · non-contrast
Comparison: None.

CLINICAL DATA: Fevers

EXAM:
PORTABLE CHEST 1 VIEW

[chest ap]
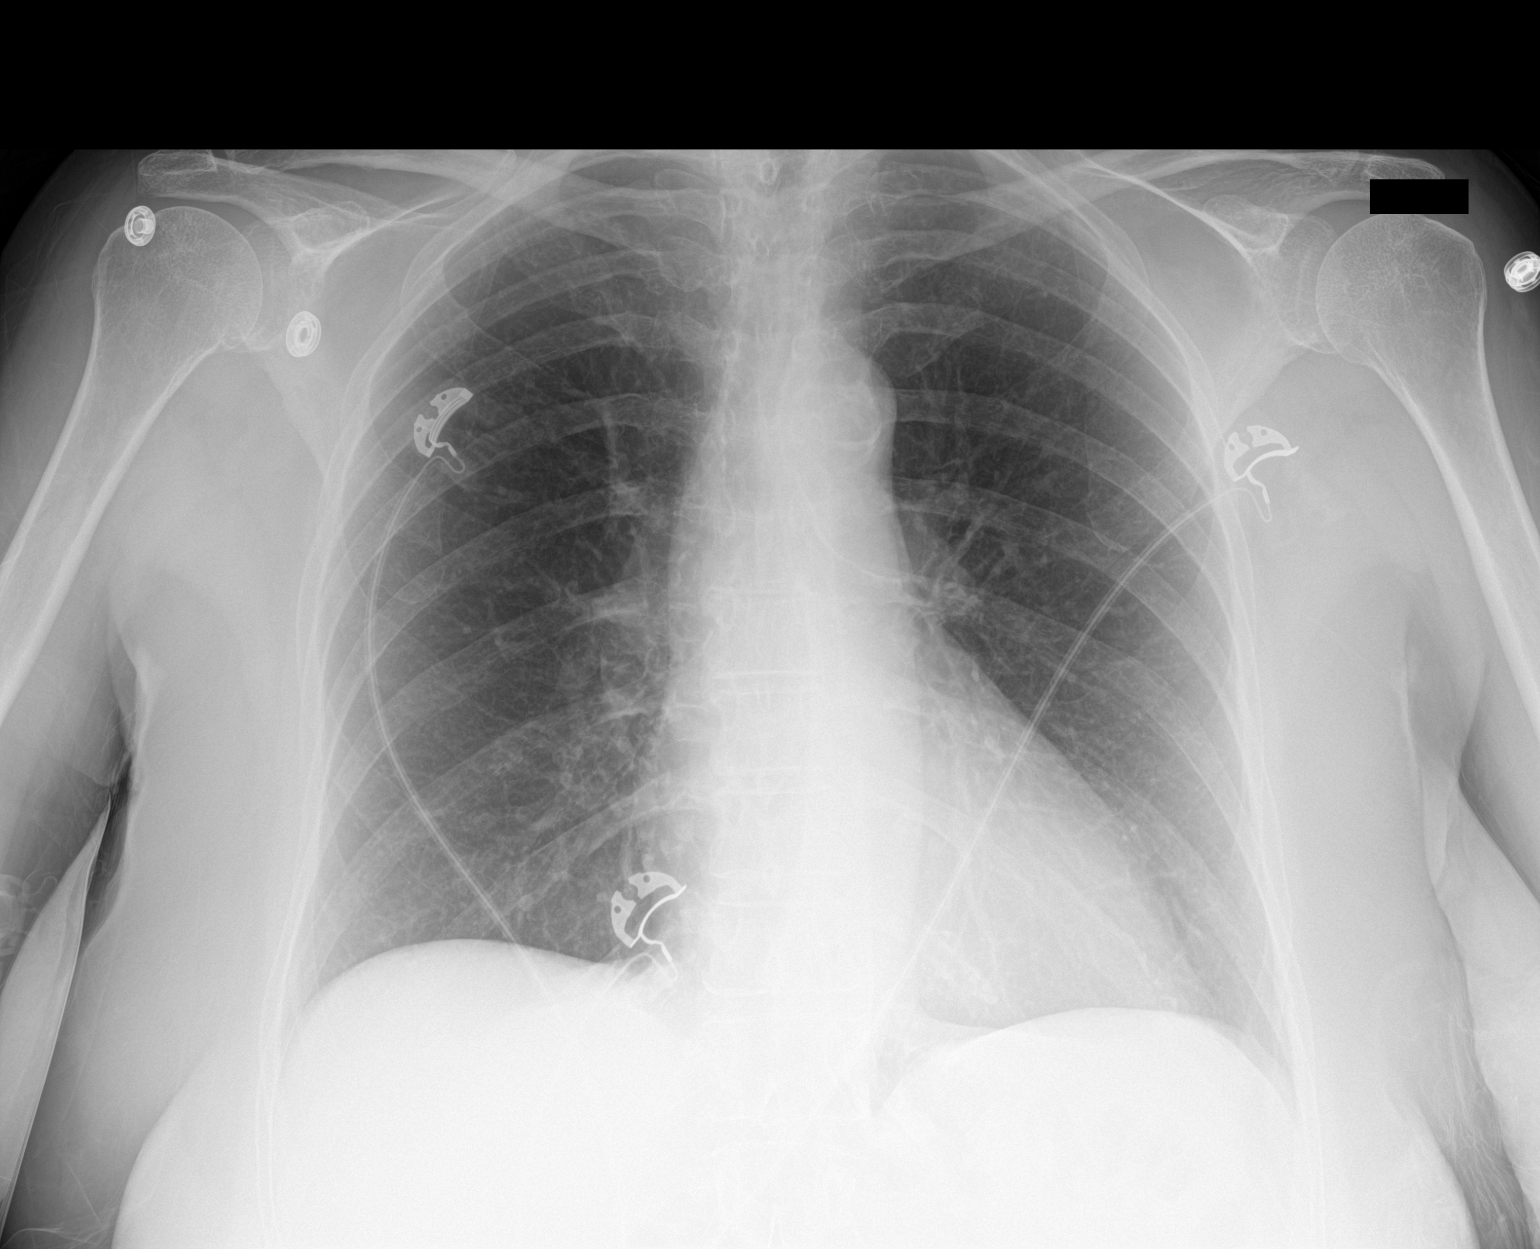

[1 of 1 positions shown; findings below may reference images not displayed]

FINDINGS: Cardiac shadow is within normal limits. Aortic calcifications are
seen. The lungs are clear. No bony abnormality is noted.
IMPRESSION: No acute abnormality seen.

## 2019-02-20 MED ORDER — GADOBUTROL 1 MMOL/ML IV SOLN
7.5000 mL | Freq: Once | INTRAVENOUS | Status: AC | PRN
  Administered 2019-02-20: 7.5 mL via INTRAVENOUS

## 2019-02-20 MED ORDER — METHYLPREDNISOLONE 4 MG PO TBPK: ORAL_TABLET | ORAL | 0 refills | Status: DC

## 2019-02-20 NOTE — ED Notes (Signed)
Patient ambulated from her room to the end of the hall at CT and back to stretcher with a steady slow gait. Provided a cup of ice water.

## 2019-02-20 NOTE — Discharge Instructions (Signed)
Your testing is reassuring.  Your MRI is stable and shows no acute problem with your back but there is a bulging disc pushing on one of your nerves.  There is no evidence of infection.  Take the steroids as prescribed and follow-up with your primary doctor.  Return to the ED with worsening pain, fever, weakness, numbness, tingling, bowel or bladder incontinence or any other concerns.

## 2019-02-20 NOTE — ED Provider Notes (Signed)
[facility] DEPT Provider Note   CSN: [id number] Arrival date & time: 02/20/19  1553     History Chief Complaint  Patient presents with  . Back Pain    Darlene [name] is a 86 y.o. female.  Patient with history of atrial fibrillation, hypertension, previous stroke with residual visual deficits here with midline low back pain.  States she may have injured her back yesterday as she is moving apartments and has been moving lots of boxes but does not recall specific injury.  She felt well when she went to bed last night but woke up around 2 AM with severe midline low back pain.  She had similar pain about 7 years ago and required surgery in [place].  At that time she had pain going down her left leg with numbness but denies any radiation of the pain today.  She denies any focal weakness in her legs, numbness, tingling.  No bowel or bladder incontinence.  No vomiting.  Denies fever but feels warm on arrival with a temperature of 99.7.  States she normally has a low temperature.  No chest pain or shortness of breath.  No pain with urination or blood in the urine.  She takes Plavix but no other anticoagulation.  She normally does not have back pain. No chest pain or abdominal pain  The history is provided by the patient.  Back Pain Associated symptoms: fever   Associated symptoms: no abdominal pain, no chest pain, no dysuria, no headaches and no weakness        Past Medical History:  Diagnosis Date  . Anxiety   . Asthma 04/06/2018  . Atrial fibrillation ([place])   . Colon polyps   . Depression   . Hyperlipidemia   . Hypertension   . Hypothyroidism   . Skin cancer   . Sleep apnea   . Stroke [facility]) 2008    Patient Active Problem List   Diagnosis Date Noted  . Daytime sleepiness 11/30/2018  . History of colonic polyps 09/03/2018  . Dysphagia 09/03/2018  . Pyrosis 09/03/2018  . Chronic sore throat 09/03/2018  . Asthma 04/06/2018  . Abnormal weight gain  02/23/2018  . Actinic keratoses 02/23/2018  . Seasonal and perennial allergic rhinitis 01/31/2018  . Incontinent of urine 12/01/2017  . Essential hypertension 09/06/2017  . History of CVA (cerebrovascular accident) 09/06/2017  . Hyperlipidemia 09/06/2017  . GAD (generalized anxiety disorder) 09/06/2017  . Acquired hypothyroidism 09/06/2017  . Chronic sinusitis 09/06/2017  . Gastroesophageal reflux disease 09/06/2017    Past Surgical History:  Procedure Laterality Date  . ADENOIDECTOMY    . APPENDECTOMY  1960  . [place] SURGERY  2014  . MESH APPLIED TO LAP PORT    . TONSILLECTOMY  1940     OB History   No obstetric history on file.     Family History  Problem Relation Age of Onset  . Lung cancer Mother 21       smoker  . Heart disease Mother   . Stroke Father 56  . Allergic rhinitis Sister   . CVA Brother   . Lung cancer Maternal Grandfather   . Heart attack Paternal Grandfather   . Psoriasis Daughter   . Rheum arthritis Daughter   . Bipolar disorder Daughter     Social History   Tobacco Use  . Smoking status: Never Smoker  . Smokeless tobacco: Never Used  Substance Use Topics  . Alcohol use: Yes    Alcohol/week: 2.0 standard  drinks    Types: 2 Glasses of wine per week  . Drug use: Never    Home Medications Prior to Admission medications   Medication Sig Start Date End Date Taking? Authorizing Provider  acetaminophen (TYLENOL) 325 MG tablet Take 650 mg by mouth as needed (for sinus headache).    [provider]  azelastine (ASTELIN) 0.1 % nasal spray Place 2 sprays into both nostrils 2 (two) times daily. 01/31/18   Valentina Shaggy, MD  busPIRone (BUSPAR) 5 MG tablet TAKE 1/2 TABLET BY MOUTH DAILY 02/13/19   [name], [name], NP  clopidogrel (PLAVIX) 75 MG tablet TAKE 1 TABLET BY MOUTH EVERY DAY 12/01/18   Virgie Dad, MD  DIAZEPAM PO Take by mouth as needed. Unsure of dose    [provider]  fluticasone (FLONASE) 50 MCG/ACT nasal  spray Place 2 sprays into both nostrils daily.     [provider]  levothyroxine (SYNTHROID, LEVOTHROID) 100 MCG tablet Take 100 mcg by mouth daily before breakfast.    [provider]  losartan (COZAAR) 100 MG tablet TAKE 1/2 TABLET BY MOUTH EVERY DAY 09/14/18   [name], [name], NP  omeprazole (PRILOSEC) 40 MG capsule TAKE 1 CAPSULE BY MOUTH TWICE A DAY FOR 3 MONTHS 01/22/19   Mansouraty, Telford Nab., MD  pravastatin (PRAVACHOL) 40 MG tablet Take 40 mg by mouth at bedtime.     [provider]  sodium chloride (OCEAN) 0.65 % SOLN nasal spray Place 1 spray into both nostrils as needed for congestion.    [provider]    Allergies    Dust mite extract, Oxycontin [oxycodone hcl], and Pollen extract  Review of Systems   Review of Systems  Constitutional: Positive for fatigue and fever. Negative for activity change and appetite change.  HENT: Negative for congestion and rhinorrhea.   Respiratory: Negative for cough, chest tightness and shortness of breath.   Cardiovascular: Negative for chest pain.  Gastrointestinal: Negative for abdominal pain, nausea and vomiting.  Genitourinary: Negative for dysuria and hematuria.  Musculoskeletal: Positive for back pain. Negative for myalgias.  Skin: Negative for rash.  Neurological: Negative for dizziness, weakness and headaches.   all other systems are negative except as noted in the HPI and PMH.    Physical Exam Updated Vital Signs BP 130/84   Pulse 82   Temp 99.7 F (37.6 C) (Oral)   Resp 16   Ht 5\' 3"  (1.6 m)   Wt 71.7 kg   SpO2 98%   BMI 27.99 kg/m   Physical Exam Vitals and nursing note reviewed.  Constitutional:      General: She is not in acute distress.    Appearance: She is well-developed.     Comments: Feels warm  HENT:     Head: Normocephalic and atraumatic.     Mouth/Throat:     Pharynx: No oropharyngeal exudate.  Eyes:     Conjunctiva/sclera: Conjunctivae normal.     Pupils: Pupils are  equal, round, and reactive to light.  Neck:     Comments: No meningismus. Cardiovascular:     Rate and Rhythm: Normal rate and regular rhythm.     Heart sounds: Normal heart sounds. No murmur.  Pulmonary:     Effort: Pulmonary effort is normal. No respiratory distress.     Breath sounds: Normal breath sounds.  Abdominal:     Palpations: Abdomen is soft.     Tenderness: There is no abdominal tenderness. There is no guarding or rebound.  Musculoskeletal:        General: Tenderness present. Normal range of motion.     Cervical back: Normal range of motion and neck supple.     Comments: TTP lumbar spine.  Surgical incision well-healed.  No overlying erythema.  5/5 strength in bilateral lower extremities. Ankle plantar and dorsiflexion intact. Great toe extension intact bilaterally. +2 DP and PT pulses. +2 patellar reflexes on R, absent on L (states this is chronic since her surgery 7 years ago). Normal gait.   Skin:    General: Skin is warm.     Capillary Refill: Capillary refill takes less than 2 seconds.  Neurological:     General: No focal deficit present.     Mental Status: She is alert and oriented to person, place, and time. Mental status is at baseline.     Cranial Nerves: No cranial nerve deficit.     Motor: No abnormal muscle tone.     Coordination: Coordination normal.     Comments:  5/5 strength throughout. CN 2-12 intact.Equal grip strength.   Psychiatric:        Behavior: Behavior normal.     ED Results / Procedures / Treatments   Labs (all labs ordered are listed, but only abnormal results are displayed) Labs Reviewed  URINALYSIS, ROUTINE W REFLEX MICROSCOPIC - Abnormal; Notable for the following components:      Result Value   Leukocytes,Ua TRACE (*)    All other components within normal limits  COMPREHENSIVE METABOLIC PANEL - Abnormal; Notable for the following components:   Glucose, Bld 117 (*)    GFR calc non Af Amer 55 (*)    All other components within  normal limits  RESPIRATORY PANEL BY RT PCR (FLU A&B, COVID)  URINE CULTURE  CULTURE, BLOOD (ROUTINE X 2)  CULTURE, BLOOD (ROUTINE X 2)  CBC WITH DIFFERENTIAL/PLATELET  LACTIC ACID, PLASMA    EKG None  Radiology MR Lumbar Spine W Wo Contrast  Result Date: 02/20/2019 CLINICAL DATA:  Low back pain and fever EXAM: MRI LUMBAR SPINE WITHOUT AND WITH CONTRAST TECHNIQUE: Multiplanar and multiecho pulse sequences of the lumbar spine were obtained without and with intravenous contrast. CONTRAST:  7.64mL GADAVIST GADOBUTROL 1 MMOL/ML IV SOLN COMPARISON:  None. FINDINGS: Segmentation:  Standard. Alignment:  Physiologic. Vertebrae: No fracture, evidence of discitis, or bone lesion. Nonspecific heterogeneous bone marrow signal. Conus medullaris and cauda equina: Conus extends to the L1 level. Conus and cauda equina appear normal. Paraspinal and other soft tissues: Negative Disc levels: T12-L1: Normal disc space and facet joints. There is no spinal canal stenosis. No neural foraminal stenosis. L1-L2: Normal disc space and facet joints. There is no spinal canal stenosis. No neural foraminal stenosis. L2-L3: Disc desiccation with minimal bulge. Normal facets. There is no spinal canal stenosis. No neural foraminal stenosis. L3-L4: Disc desiccation and mild bulge. Normal facets. There is no spinal canal stenosis. Mild bilateral neural foraminal stenosis. L4-L5: Mild disc bulge with endplate spurring . No spinal canal stenosis. Mild bilateral neural foraminal stenosis. L5-S1: Right asymmetric disc bulge. Normal facets. There is no spinal canal stenosis. Moderate right neural foraminal stenosis. The disc contacts the exiting right L5 nerve root. Visualized sacrum: Normal. IMPRESSION: 1. No epidural collection or discitis/osteomyelitis. 2. Moderate right L5-S1 neural foraminal stenosis with suspected contact of the exiting right L5 nerve root. Correlate for right L5 radiculopathy. 3. Mild bilateral L3-L4 and L4-L5 neural  foraminal stenosis. 4. Heterogeneous bone marrow signal, nonspecific, variably seen in the context of  smoking or chronic anemia. Electronically Signed   By: Ulyses Jarred M.D.   On: 02/20/2019 22:19   DG Chest Portable 1 View  Result Date: 02/20/2019 CLINICAL DATA:  Fevers EXAM: PORTABLE CHEST 1 VIEW COMPARISON:  None. FINDINGS: Cardiac shadow is within normal limits. Aortic calcifications are seen. The lungs are clear. No bony abnormality is noted. IMPRESSION: No acute abnormality seen. Electronically Signed   By: Inez Catalina M.D.   On: 02/20/2019 19:33    Procedures Procedures (including critical care time)  Medications Ordered in ED Medications - No data to display  ED Course  I have reviewed the triage vital signs and the nursing notes.  Pertinent labs & imaging results that were available during my care of the patient were reviewed by me and considered in my medical decision making (see chart for details).    MDM Rules/Calculators/A&P                      Low back pain since last night here.  No acute neurological deficits.  Patient does have a fever 100.8.  She is neurologically intact.  She has a chronic absent left patellar reflex but no other neurological changes.  With her back pain as well as fever will need to proceed with MRI imaging.  Her urinalysis is negative.  Her chest x-ray is negative. Urine culture pending. She denies any cough, runny nose, sore throat, nausea, vomiting, abdominal pain.  MRI is obtained and shows no evidence of epidural abscess or other acute pathology.  There is mild nerve root impingement of L5 may be causing sciatica D/w Dr. Zada Finders of neurosurgery.  He agrees no evidence of epidural abscess.  We will give course of steroids.  Patient tolerating p.o. and ambulatory. Follow-up with her PCP.  Return precautions discussed  Darlene [name] was evaluated in Emergency Department on 02/20/2019 for the symptoms described in the history of present  illness. She was evaluated in the context of the global COVID-19 pandemic, which necessitated consideration that the patient might be at risk for infection with the SARS-CoV-2 virus that causes COVID-19. Institutional protocols and algorithms that pertain to the evaluation of patients at risk for COVID-19 are in a state of rapid change based on information released by regulatory bodies including the CDC and federal and state organizations. These policies and algorithms were followed during the patient's care in the ED.  Final Clinical Impression(s) / ED Diagnoses Final diagnoses:  Acute midline low back pain without sciatica  Fever, unspecified fever cause    Rx / DC Orders ED Discharge Orders    None       Josef Tourigny, Annie Main, MD 02/21/19 [phone]

## 2019-02-20 NOTE — ED Notes (Signed)
Notified [facility] for transportation 

## 2019-02-20 NOTE — ED Triage Notes (Signed)
Patient c/o mid lower back pain that began last night. Patient denies any radiation of pain.

## 2019-02-20 NOTE — ED Notes (Signed)
MRI at bedside

## 2019-02-21 LAB — URINE CULTURE

## 2019-02-21 NOTE — Progress Notes (Signed)
Patient unable to wait for [facility] to pick her up. Pt called daughter and called for a taxi to go home. Patient a/o, Ambulatory.

## 2019-02-22 ENCOUNTER — Encounter: Payer: Medicare Other | Admitting: Nurse Practitioner

## 2019-02-25 LAB — CULTURE, BLOOD (ROUTINE X 2)
Culture: NO GROWTH
Culture: NO GROWTH
Special Requests: ADEQUATE
Special Requests: ADEQUATE

## 2019-03-01 ENCOUNTER — Encounter: Payer: Self-pay | Admitting: Nurse Practitioner

## 2019-03-01 ENCOUNTER — Non-Acute Institutional Stay: Payer: Medicare Other | Admitting: Nurse Practitioner

## 2019-03-01 ENCOUNTER — Other Ambulatory Visit: Payer: Self-pay

## 2019-03-01 DIAGNOSIS — I10 Essential (primary) hypertension: Secondary | ICD-10-CM

## 2019-03-01 DIAGNOSIS — Z8673 Personal history of transient ischemic attack (TIA), and cerebral infarction without residual deficits: Secondary | ICD-10-CM

## 2019-03-01 DIAGNOSIS — R400 Somnolence: Secondary | ICD-10-CM

## 2019-03-01 DIAGNOSIS — F411 Generalized anxiety disorder: Secondary | ICD-10-CM

## 2019-03-01 DIAGNOSIS — E039 Hypothyroidism, unspecified: Secondary | ICD-10-CM | POA: Diagnosis not present

## 2019-03-01 DIAGNOSIS — J3089 Other allergic rhinitis: Secondary | ICD-10-CM | POA: Diagnosis not present

## 2019-03-01 DIAGNOSIS — J302 Other seasonal allergic rhinitis: Secondary | ICD-10-CM

## 2019-03-01 DIAGNOSIS — K219 Gastro-esophageal reflux disease without esophagitis: Secondary | ICD-10-CM | POA: Diagnosis not present

## 2019-03-01 DIAGNOSIS — M5432 Sciatica, left side: Secondary | ICD-10-CM | POA: Insufficient documentation

## 2019-03-01 MED ORDER — SERTRALINE HCL 25 MG PO TABS: 25.0000 mg | ORAL_TABLET | Freq: Every day | ORAL | 2 refills | Status: DC

## 2019-03-01 NOTE — Assessment & Plan Note (Signed)
Stable, continue Levothyroxine, TSH 1.02 11/2018

## 2019-03-01 NOTE — Assessment & Plan Note (Addendum)
Chronic, early am awake, difficulty of return to sleep, failed Luvox, , prn Valium. Will try Sertraline 25mg  qd. The patient stated she has Hx of Afib, may have an episode in the past, may consider EKG/cardiology if needed.

## 2019-03-01 NOTE — Patient Instructions (Addendum)
F/u in clinic 4 weeks, Sertraline 25mg  daily by mouth sent in today, observe for possible side effects as well as effectiveness.

## 2019-03-01 NOTE — Assessment & Plan Note (Signed)
Multiple factorials: sleep apnea-not using CPAP-her dentist may be able to help her with a mouth piece device at night         early am taking Valium          depression/anxiety with early am awake/difficulty returning to sleep         chronic sinus congestion

## 2019-03-01 NOTE — Assessment & Plan Note (Signed)
Hx of poor left peripheral vision from Hx of CVA 12/21/17 Dr Nadyne Coombes, Cardiology: last chest pain about a year ago, it was pleurisy, negative stress test. CVA in 2006, EKG normal, okay to ASA 81mg  qd.  02/28/18 on Plavix

## 2019-03-01 NOTE — Assessment & Plan Note (Signed)
Blood pressure is controlled, continue Losartan

## 2019-03-01 NOTE — Assessment & Plan Note (Signed)
ED eval 02/20/19 midline low back pain, down to the left leg with numbness, MRI mild nerve root impingement of L5, neurology consult, may cause sciatica, course of steroids.

## 2019-03-01 NOTE — Progress Notes (Signed)
Location:   clinic [place]   Place of Service:  Clinic (12) Provider: Marlana Latus NP  Code Status: DNR Goals of Care: [place] 02/20/2019  Does Patient Have a Medical Advance Directive? Yes  Type of Paramedic of [place];Living will  Does patient want to make changes to medical advance directive? No - Patient declined     Chief Complaint  Patient presents with  . Medical Management of Chronic Issues    ER visit follow up    HPI: Patient is a 86 y.o. female seen today for medical management of chronic diseases.    ED eval 02/20/19 midline low back pain, down to the left leg with numbness, MRI mild nerve root impingement of L5, neurology consult, may cause sciatica, course of steroids completed. UA/CXR unremarkable.   Hx of chronic rhinitis, on Astelin bid. Anxiety, early awake, difficulty return to sleep, then take a valium, feels exhausted in morning, failed Buspar 5mg  qd, Valium prn. Hx of CVA, on plavix. Hypothyroidism, on Levothyroxine 121mcg qd. HTN, blood pressure is controlled on Losartan 100mg  qd. GERD, stable Omeprazole 40mg  qd.    Past Medical History:  Diagnosis Date  . Anxiety   . Asthma 04/06/2018  . Atrial fibrillation ([place])   . Colon polyps   . Depression   . Hyperlipidemia   . Hypertension   . Hypothyroidism   . Skin cancer   . Sleep apnea   . Stroke [facility]) 2008    Past Surgical History:  Procedure Laterality Date  . ADENOIDECTOMY    . APPENDECTOMY  1960  . [place] SURGERY  2014  . MESH APPLIED TO LAP PORT    . TONSILLECTOMY  1940    Allergies  Allergen Reactions  . Dust Mite Extract   . Oxycontin [Oxycodone Hcl] Nausea And Vomiting  . Pollen Extract     Allergies as of 03/01/2019      Reactions   Dust Mite Extract    Oxycontin [oxycodone Hcl] Nausea And Vomiting   Pollen Extract       Medication List       Accurate as of March 01, 2019 11:59 PM. If you have any questions, ask your nurse or doctor.         STOP taking these medications   busPIRone 5 MG tablet Commonly known as: BUSPAR Stopped by: Siyah Mault X Deshawnda Acrey, NP   methylPREDNISolone 4 MG Tbpk tablet Commonly known as: MEDROL DOSEPAK Stopped by: Ted Leonhart X Khole Arterburn, NP     TAKE these medications   acetaminophen 325 MG tablet Commonly known as: TYLENOL Take 650 mg by mouth as needed (for sinus headache).   azelastine 0.1 % nasal spray Commonly known as: ASTELIN Place 2 sprays into both nostrils 2 (two) times daily.   clopidogrel 75 MG tablet Commonly known as: PLAVIX TAKE 1 TABLET BY MOUTH EVERY DAY   diazepam 5 MG tablet Commonly known as: VALIUM Take 2.5 mg by mouth as needed (anxiety).   levothyroxine 100 MCG tablet Commonly known as: SYNTHROID Take 100 mcg by mouth daily before breakfast.   losartan 100 MG tablet Commonly known as: COZAAR TAKE 1/2 TABLET BY MOUTH EVERY DAY What changed:   how much to take  how to take this  when to take this  additional instructions   mupirocin ointment 2 % Commonly known as: BACTROBAN Apply 1 application topically daily.   omeprazole 40 MG capsule Commonly known as: PRILOSEC TAKE 1 CAPSULE BY MOUTH TWICE A  DAY FOR 3 MONTHS   pravastatin 40 MG tablet Commonly known as: PRAVACHOL Take 40 mg by mouth at bedtime.   sertraline 25 MG tablet Commonly known as: Zoloft Take 1 tablet (25 mg total) by mouth daily. Started by: Christiana Gurevich X Regis Wiland, NP   sodium chloride 0.65 % Soln nasal spray Commonly known as: OCEAN Place 1 spray into both nostrils as needed for congestion.   triamcinolone cream 0.1 % Commonly known as: KENALOG Apply 1 application topically at bedtime. Apply to chest       Review of Systems:  Review of Systems  Constitutional: Negative for activity change, appetite change, chills, diaphoresis, fatigue and fever.  HENT: Positive for congestion and hearing loss. Negative for voice change.   Eyes: Positive for visual disturbance.       Left lateral visual field  deficit  Respiratory: Negative for cough, shortness of breath and wheezing.   Cardiovascular: Negative for chest pain, palpitations and leg swelling.  Gastrointestinal: Negative for abdominal distention, abdominal pain, constipation, diarrhea, nausea and vomiting.  Genitourinary: Negative for difficulty urinating, dysuria and urgency.  Musculoskeletal: Positive for back pain and gait problem.  Skin: Negative for color change and pallor.  Neurological: Negative for dizziness, speech difficulty, weakness and headaches.  Psychiatric/Behavioral: Positive for dysphoric mood. Negative for agitation and behavioral problems. The patient is nervous/anxious.        Early am awake, difficulty returning asleep.     Health Maintenance  Topic Date Due  . DEXA SCAN  07/17/2000  . TETANUS/TDAP  11/26/2018  . INFLUENZA VACCINE  10/19/2019 (Originally 08/19/2018)  . COLONOSCOPY  10/25/2021  . PNA vac Low Risk Adult  Completed    Physical Exam: Vitals:   03/01/19 1634  BP: 116/70  Pulse: 61  Temp: (!) 97.3 F (36.3 C)  SpO2: 92%  Weight: 159 lb 12.8 oz (72.5 kg)  Height: 5\' 3"  (1.6 m)   Body mass index is 28.31 kg/m. Physical Exam Vitals and nursing note reviewed.  Constitutional:      General: She is not in acute distress.    Appearance: Normal appearance. She is not ill-appearing, toxic-appearing or diaphoretic.  HENT:     Head: Normocephalic and atraumatic.     Nose: Nose normal.     Mouth/Throat:     Mouth: Mucous membranes are moist.  Eyes:     Extraocular Movements: Extraocular movements intact.     Conjunctiva/sclera: Conjunctivae normal.     Pupils: Pupils are equal, round, and reactive to light.     Comments: Left peripheral visual field deficit since CVA 2006  Cardiovascular:     Rate and Rhythm: Normal rate and regular rhythm.     Heart sounds: No murmur.  Pulmonary:     Breath sounds: No wheezing or rales.  Abdominal:     General: Bowel sounds are normal. There is no  distension.     Palpations: Abdomen is soft.     Tenderness: There is no abdominal tenderness. There is no right CVA tenderness, left CVA tenderness, guarding or rebound.  Musculoskeletal:     Cervical back: Normal range of motion and neck supple.     Right lower leg: No edema.     Left lower leg: No edema.  Skin:    General: Skin is warm and dry.  Neurological:     General: No focal deficit present.     Mental Status: She is alert and oriented to person, place, and time. Mental status is at baseline.  Motor: No weakness.     Coordination: Coordination normal.     Gait: Gait normal.  Psychiatric:     Comments: Appears anxious.      Labs reviewed: Basic Metabolic Panel: Recent Labs    12/05/18 0700 02/20/19 1729  NA 144 139  K 4.1 4.0  CL 109 107  CO2 27 24  GLUCOSE 104* 117*  BUN 14 13  CREATININE 0.78 0.96  CALCIUM 9.6 9.3  TSH 1.02  --    Liver Function Tests: Recent Labs    12/05/18 0700 02/20/19 1729  AST 17 17  ALT 24 17  ALKPHOS  --  72  BILITOT 0.4 0.7  PROT 6.4 6.9  ALBUMIN  --  3.9   No results for input(s): LIPASE, AMYLASE in the last 8760 hours. No results for input(s): AMMONIA in the last 8760 hours. CBC: Recent Labs    09/01/18 1139 12/05/18 0700 02/20/19 1729  WBC 7.4 6.3 6.4  NEUTROABS  --  2,684 4.6  HGB 14.6 14.0 14.0  HCT 44.1 41.9 43.1  MCV 92.3 89.7 91.7  PLT 256.0 239 235   Lipid Panel: Recent Labs    12/05/18 0700  CHOL 191  HDL 48*  LDLCALC 119*  TRIG 127  CHOLHDL 4.0   No results found for: HGBA1C  Procedures since last visit: MR Lumbar Spine W Wo Contrast  Result Date: 02/20/2019 CLINICAL DATA:  Low back pain and fever EXAM: MRI LUMBAR SPINE WITHOUT AND WITH CONTRAST TECHNIQUE: Multiplanar and multiecho pulse sequences of the lumbar spine were obtained without and with intravenous contrast. CONTRAST:  7.69mL GADAVIST GADOBUTROL 1 MMOL/ML IV SOLN COMPARISON:  None. FINDINGS: Segmentation:  Standard. Alignment:   Physiologic. Vertebrae: No fracture, evidence of discitis, or bone lesion. Nonspecific heterogeneous bone marrow signal. Conus medullaris and cauda equina: Conus extends to the L1 level. Conus and cauda equina appear normal. Paraspinal and other soft tissues: Negative Disc levels: T12-L1: Normal disc space and facet joints. There is no spinal canal stenosis. No neural foraminal stenosis. L1-L2: Normal disc space and facet joints. There is no spinal canal stenosis. No neural foraminal stenosis. L2-L3: Disc desiccation with minimal bulge. Normal facets. There is no spinal canal stenosis. No neural foraminal stenosis. L3-L4: Disc desiccation and mild bulge. Normal facets. There is no spinal canal stenosis. Mild bilateral neural foraminal stenosis. L4-L5: Mild disc bulge with endplate spurring . No spinal canal stenosis. Mild bilateral neural foraminal stenosis. L5-S1: Right asymmetric disc bulge. Normal facets. There is no spinal canal stenosis. Moderate right neural foraminal stenosis. The disc contacts the exiting right L5 nerve root. Visualized sacrum: Normal. IMPRESSION: 1. No epidural collection or discitis/osteomyelitis. 2. Moderate right L5-S1 neural foraminal stenosis with suspected contact of the exiting right L5 nerve root. Correlate for right L5 radiculopathy. 3. Mild bilateral L3-L4 and L4-L5 neural foraminal stenosis. 4. Heterogeneous bone marrow signal, nonspecific, variably seen in the context of smoking or chronic anemia. Electronically Signed   By: Ulyses Jarred M.D.   On: 02/20/2019 22:19   DG Chest Portable 1 View  Result Date: 02/20/2019 CLINICAL DATA:  Fevers EXAM: PORTABLE CHEST 1 VIEW COMPARISON:  None. FINDINGS: Cardiac shadow is within normal limits. Aortic calcifications are seen. The lungs are clear. No bony abnormality is noted. IMPRESSION: No acute abnormality seen. Electronically Signed   By: Inez Catalina M.D.   On: 02/20/2019 19:33    Assessment/Plan  Essential hypertension Blood  pressure is controlled, continue Losartan  Seasonal and perennial allergic rhinitis  Chronic, continue Astelin bid  Gastroesophageal reflux disease Stable, continue Omeprazole.   Acquired hypothyroidism Stable, continue Levothyroxine, TSH 1.02 11/2018  GAD (generalized anxiety disorder) Chronic, early am awake, difficulty of return to sleep, failed Luvox, , prn Valium. Will try Sertraline 25mg  qd. The patient stated she has Hx of Afib, may have an episode in the past, may consider EKG/cardiology if needed.   History of CVA (cerebrovascular accident) Hx of poor left peripheral vision from Hx of CVA 12/21/17 Dr Nadyne Coombes, Cardiology: last chest pain about a year ago, it was pleurisy, negative stress test. CVA in 2006, EKG normal, okay to ASA 81mg  qd.  02/28/18 on Plavix  Back pain with left-sided sciatica ED eval 02/20/19 midline low back pain, down to the left leg with numbness, MRI mild nerve root impingement of L5, neurology consult, may cause sciatica, course of steroids.   Daytime sleepiness Multiple factorials: sleep apnea-not using CPAP-her dentist may be able to help her with a mouth piece device at night         early am taking Valium          depression/anxiety with early am awake/difficulty returning to sleep         chronic sinus congestion    Labs/tests ordered: none  Next appt: 4 weeks

## 2019-03-01 NOTE — Assessment & Plan Note (Signed)
Stable, continue Omeprazole.  

## 2019-03-01 NOTE — Assessment & Plan Note (Signed)
Chronic, continue Astelin bid

## 2019-03-02 ENCOUNTER — Encounter: Payer: Self-pay | Admitting: Nurse Practitioner

## 2019-03-19 ENCOUNTER — Telehealth: Payer: Self-pay | Admitting: Gastroenterology

## 2019-03-19 HISTORY — PX: GUM SURGERY: SHX658

## 2019-03-19 NOTE — Telephone Encounter (Signed)
The pt questioned whether she should continue to take omeprazole.  She has not been seen in several months so she was scheduled an office visit in April to follow up.  She will continue to take omeprazole until that appt.or call if needed before then.  The pt has been advised of the information and verbalized understanding.

## 2019-03-24 ENCOUNTER — Other Ambulatory Visit: Payer: Self-pay | Admitting: Nurse Practitioner

## 2019-03-24 DIAGNOSIS — F411 Generalized anxiety disorder: Secondary | ICD-10-CM

## 2019-03-26 NOTE — Telephone Encounter (Signed)
Pharmacy is requesting a refill on Zoloft with 90 days supply. Pharmacy is also asking for Diagnosis code with medication. Medication pend and sent to provider. Please Advise.

## 2019-03-29 ENCOUNTER — Encounter: Payer: Self-pay | Admitting: Nurse Practitioner

## 2019-03-29 ENCOUNTER — Non-Acute Institutional Stay: Payer: Medicare Other | Admitting: Nurse Practitioner

## 2019-03-29 ENCOUNTER — Other Ambulatory Visit: Payer: Self-pay

## 2019-03-29 DIAGNOSIS — J329 Chronic sinusitis, unspecified: Secondary | ICD-10-CM

## 2019-03-29 DIAGNOSIS — Z8673 Personal history of transient ischemic attack (TIA), and cerebral infarction without residual deficits: Secondary | ICD-10-CM

## 2019-03-29 DIAGNOSIS — E039 Hypothyroidism, unspecified: Secondary | ICD-10-CM

## 2019-03-29 DIAGNOSIS — F5101 Primary insomnia: Secondary | ICD-10-CM

## 2019-03-29 DIAGNOSIS — K219 Gastro-esophageal reflux disease without esophagitis: Secondary | ICD-10-CM | POA: Diagnosis not present

## 2019-03-29 DIAGNOSIS — I10 Essential (primary) hypertension: Secondary | ICD-10-CM | POA: Diagnosis not present

## 2019-03-29 DIAGNOSIS — G4700 Insomnia, unspecified: Secondary | ICD-10-CM | POA: Insufficient documentation

## 2019-03-29 DIAGNOSIS — M5432 Sciatica, left side: Secondary | ICD-10-CM

## 2019-03-29 DIAGNOSIS — R400 Somnolence: Secondary | ICD-10-CM

## 2019-03-29 DIAGNOSIS — F411 Generalized anxiety disorder: Secondary | ICD-10-CM

## 2019-03-29 MED ORDER — ZOLPIDEM TARTRATE 3.5 MG SL SUBL: 3.5000 mg | SUBLINGUAL_TABLET | Freq: Every evening | SUBLINGUAL | 0 refills | Status: DC | PRN

## 2019-03-29 NOTE — Assessment & Plan Note (Signed)
ED eval 02/20/19 midline low back pain, down to the left leg with numbness, MRI mild nerve root impingement of L5, neurology consult, may cause sciatica, course of steroids completed. UA/CXR unremarkable.  03/29/19 improved.

## 2019-03-29 NOTE — Assessment & Plan Note (Signed)
Will resume Dolpidem 3.31m SL hs prn.

## 2019-03-29 NOTE — Patient Instructions (Signed)
F/u with Dr Gupta in 3 months

## 2019-03-29 NOTE — Progress Notes (Signed)
Location:   clinic [place]   Place of Service:  Clinic (12) Provider: Marlana Latus NP  Code Status: DNR Goals of Care: [place] 02/20/2019  Does Patient Have a Medical Advance Directive? Yes  Type of Paramedic of [place];Living will  Does patient want to make changes to medical advance directive? No - Patient declined     Chief Complaint  Patient presents with  . Medical Management of Chronic Issues    4 week follow up. Sleep medication gave her diarrhea.  . Health Maintenance    Dexa, TDAP    HPI: Patient is a 86 y.o. female seen today for medical management of chronic diseases.    ED eval 02/20/19 midline low back pain, down to the left leg with numbness, MRI mild nerve root impingement of L5, neurology consult, may cause sciatica, course of steroids completed. UA/CXR unremarkable. Much improved  Hx of chronic rhinitis, on Astelin bid. Anxiety, persisted early am awake, difficulty return to sleep, then take a valium, feels exhausted in morning, failed Buspar 5mg  qd, Valium prn, failed Sertraline 25mg  qd 03/01/19 which caused diarrhea.  Hx of CVA, on plavix. Hypothyroidism, on Levothyroxine 183mcg qd. HTN, blood pressure is controlled on Losartan 100mg  qd. GERD, stable Omeprazole 40mg  qd.    Past Medical History:  Diagnosis Date  . Anxiety   . Asthma 04/06/2018  . Atrial fibrillation ([place])   . Colon polyps   . Depression   . Hyperlipidemia   . Hypertension   . Hypothyroidism   . Skin cancer   . Sleep apnea   . Stroke [facility]) 2008    Past Surgical History:  Procedure Laterality Date  . ADENOIDECTOMY    . APPENDECTOMY  1960  . [place] SURGERY  2014  . MESH APPLIED TO LAP PORT    . TONSILLECTOMY  1940    Allergies  Allergen Reactions  . Dust Mite Extract   . Oxycontin [Oxycodone Hcl] Nausea And Vomiting  . Pollen Extract     Allergies as of 03/29/2019      Reactions   Dust Mite Extract    Oxycontin [oxycodone Hcl] Nausea And  Vomiting   Pollen Extract       Medication List       Accurate as of March 29, 2019 11:59 PM. If you have any questions, ask your nurse or doctor.        STOP taking these medications   sertraline 25 MG tablet Commonly known as: ZOLOFT Stopped by: Arshia Spellman X Lennie Dunnigan, NP     TAKE these medications   acetaminophen 325 MG tablet Commonly known as: TYLENOL Take 650 mg by mouth as needed (for sinus headache).   azelastine 0.1 % nasal spray Commonly known as: ASTELIN Place 2 sprays into both nostrils 2 (two) times daily.   clopidogrel 75 MG tablet Commonly known as: PLAVIX TAKE 1 TABLET BY MOUTH EVERY DAY   diazepam 5 MG tablet Commonly known as: VALIUM Take 2.5 mg by mouth as needed (anxiety).   levothyroxine 100 MCG tablet Commonly known as: SYNTHROID Take 100 mcg by mouth daily before breakfast.   losartan 100 MG tablet Commonly known as: COZAAR TAKE 1/2 TABLET BY MOUTH EVERY DAY What changed:   how much to take  how to take this  when to take this  additional instructions   mupirocin ointment 2 % Commonly known as: BACTROBAN Apply 1 application topically daily.   omeprazole 40 MG capsule Commonly known as:  PRILOSEC TAKE 1 CAPSULE BY MOUTH TWICE A DAY FOR 3 MONTHS   pravastatin 40 MG tablet Commonly known as: PRAVACHOL Take 40 mg by mouth at bedtime.   sodium chloride 0.65 % Soln nasal spray Commonly known as: OCEAN Place 1 spray into both nostrils as needed for congestion.   triamcinolone cream 0.1 % Commonly known as: KENALOG Apply 1 application topically at bedtime. Apply to chest   Zolpidem Tartrate 3.5 MG Subl Place 3.5 mg under the tongue at bedtime as needed. Started by: Keerat Denicola X Krisanne Lich, NP       Review of Systems:  Review of Systems  Constitutional: Positive for unexpected weight change. Negative for activity change, appetite change, chills, diaphoresis, fatigue and fever.  HENT: Positive for congestion, hearing loss and sinus pressure.  Negative for voice change.        Chronic sinusitis, underwent ENT, sleep study-sleep apnea.   Eyes: Positive for visual disturbance.       Left lateral visual field deficit  Respiratory: Negative for cough, shortness of breath and wheezing.   Cardiovascular: Negative for chest pain, palpitations and leg swelling.  Gastrointestinal: Negative for abdominal distention, abdominal pain, constipation, diarrhea, nausea and vomiting.  Genitourinary: Negative for difficulty urinating, dysuria and urgency.       Urinary leakage. Doing Kegel exercise.   Musculoskeletal: Negative for back pain and gait problem.  Skin: Negative for color change and pallor.  Neurological: Negative for dizziness, speech difficulty, weakness and headaches.  Psychiatric/Behavioral: Positive for dysphoric mood and sleep disturbance. Negative for agitation and behavioral problems. The patient is nervous/anxious.        Early am awake, difficulty returning asleep.     Health Maintenance  Topic Date Due  . DEXA SCAN  Never done  . TETANUS/TDAP  11/26/2018  . INFLUENZA VACCINE  10/19/2019 (Originally 08/19/2018)  . COLONOSCOPY  10/25/2021  . PNA vac Low Risk Adult  Completed    Physical Exam: Vitals:   03/29/19 1506  BP: 106/60  Pulse: 75  Temp: (!) 96.9 F (36.1 C)  SpO2: 96%  Weight: 165 lb 3.2 oz (74.9 kg)  Height: 5\' 3"  (1.6 m)   Body mass index is 29.26 kg/m. Physical Exam Vitals and nursing note reviewed.  Constitutional:      General: She is not in acute distress.    Appearance: Normal appearance. She is not ill-appearing, toxic-appearing or diaphoretic.  HENT:     Head: Normocephalic and atraumatic.     Nose: Nose normal.     Mouth/Throat:     Mouth: Mucous membranes are moist.  Eyes:     Extraocular Movements: Extraocular movements intact.     Conjunctiva/sclera: Conjunctivae normal.     Pupils: Pupils are equal, round, and reactive to light.     Comments: Left peripheral visual field deficit  since CVA 2006  Cardiovascular:     Rate and Rhythm: Normal rate and regular rhythm.     Heart sounds: No murmur.  Pulmonary:     Breath sounds: No wheezing or rales.  Abdominal:     General: Bowel sounds are normal. There is no distension.     Palpations: Abdomen is soft.     Tenderness: There is no abdominal tenderness. There is no right CVA tenderness, left CVA tenderness, guarding or rebound.  Musculoskeletal:     Cervical back: Normal range of motion and neck supple.     Right lower leg: No edema.     Left lower leg: No edema.  Skin:    General: Skin is warm and dry.  Neurological:     General: No focal deficit present.     Mental Status: She is alert and oriented to person, place, and time. Mental status is at baseline.     Motor: No weakness.     Coordination: Coordination normal.     Gait: Gait normal.  Psychiatric:     Comments: Appears anxious.      Labs reviewed: Basic Metabolic Panel: Recent Labs    12/05/18 0700 02/20/19 1729  NA 144 139  K 4.1 4.0  CL 109 107  CO2 27 24  GLUCOSE 104* 117*  BUN 14 13  CREATININE 0.78 0.96  CALCIUM 9.6 9.3  TSH 1.02  --    Liver Function Tests: Recent Labs    12/05/18 0700 02/20/19 1729  AST 17 17  ALT 24 17  ALKPHOS  --  72  BILITOT 0.4 0.7  PROT 6.4 6.9  ALBUMIN  --  3.9   No results for input(s): LIPASE, AMYLASE in the last 8760 hours. No results for input(s): AMMONIA in the last 8760 hours. CBC: Recent Labs    09/01/18 1139 12/05/18 0700 02/20/19 1729  WBC 7.4 6.3 6.4  NEUTROABS  --  2,684 4.6  HGB 14.6 14.0 14.0  HCT 44.1 41.9 43.1  MCV 92.3 89.7 91.7  PLT 256.0 239 235   Lipid Panel: Recent Labs    12/05/18 0700  CHOL 191  HDL 48*  LDLCALC 119*  TRIG 127  CHOLHDL 4.0   No results found for: HGBA1C  Procedures since last visit: No results found.  Assessment/Plan  Essential hypertension Blood pressure is controlled, continue Losartan.   Chronic sinusitis Chronic, under went ENT  evaluation, continue Astelin bid.  Gastroesophageal reflux disease Stable, continue Omeprazole 40mg  qd  Acquired hypothyroidism Stable, TSH 1.02 11/2018, continue Levothyroxine 154mcg qd.   Back pain with left-sided sciatica ED eval 02/20/19 midline low back pain, down to the left leg with numbness, MRI mild nerve root impingement of L5, neurology consult, may cause sciatica, course of steroids completed. UA/CXR unremarkable.  03/29/19 improved.   GAD (generalized anxiety disorder) persisted early am awake, difficulty return to sleep, then take a valium, feels exhausted in morning, failed Buspar 5mg  qd. Stopped taking Sertraline 25mg  qd started 03/01/19 due to diarrhea, continued Valium 2.61m prn. The patient took Ambien 3.5mg  qd.   Daytime sleepiness Underwent neurology eval 01/02/20, declined recommendation sleep study. The patient agreed to reduce Valium to 2.5mg  prn  History of CVA (cerebrovascular accident) Hx of poor left peripheral vision from Hx of CVA Saw Dr. Nadyne Coombes. 12/21/17 Dr Nadyne Coombes, Cardiology: last chest pain about a year ago, it was pleurisy, negative stress test. CVA in 2006, EKG normal, okay to ASA 81mg  qd.  03/29/19 continued Plavix.     Insomnia Will resume Dolpidem 3.76m SL hs prn.    Labs/tests ordered: none  Next appt: 3 months with Dr. Lyndel Safe.

## 2019-03-29 NOTE — Assessment & Plan Note (Signed)
Underwent neurology eval 01/02/20, declined recommendation sleep study. The patient agreed to reduce Valium to 2.5mg  prn

## 2019-03-29 NOTE — Assessment & Plan Note (Signed)
Stable, TSH 1.02 11/2018, continue Levothyroxine 119mcg qd.

## 2019-03-29 NOTE — Assessment & Plan Note (Addendum)
persisted early am awake, difficulty return to sleep, then take a valium, feels exhausted in morning, failed Buspar 5mg  qd. Stopped taking Sertraline 25mg  qd started 03/01/19 due to diarrhea, continued Valium 2.83m prn. The patient took Ambien 3.5mg  qd.

## 2019-03-29 NOTE — Assessment & Plan Note (Signed)
Blood pressure is controlled, continue Losartan 

## 2019-03-29 NOTE — Assessment & Plan Note (Signed)
Chronic, under went ENT evaluation, continue Astelin bid.

## 2019-03-29 NOTE — Assessment & Plan Note (Signed)
Stable, continue Omeprazole 40mg qd.  

## 2019-03-29 NOTE — Assessment & Plan Note (Addendum)
Hx of poor left peripheral vision from Hx of CVA Saw Dr. Nadyne Coombes. 12/21/17 Dr Nadyne Coombes, Cardiology: last chest pain about a year ago, it was pleurisy, negative stress test. CVA in 2006, EKG normal, okay to ASA 81mg  qd.  03/29/19 continued Plavix.

## 2019-03-30 ENCOUNTER — Encounter: Payer: Self-pay | Admitting: Nurse Practitioner

## 2019-04-03 ENCOUNTER — Encounter (HOSPITAL_COMMUNITY): Payer: Self-pay

## 2019-04-03 ENCOUNTER — Emergency Department (HOSPITAL_COMMUNITY)
Admission: EM | Admit: 2019-04-03 | Discharge: 2019-04-03 | Disposition: A | Discharge status: Home / Self Care | Payer: Medicare Other | Attending: Emergency Medicine | Admitting: Emergency Medicine

## 2019-04-03 ENCOUNTER — Other Ambulatory Visit: Payer: Self-pay

## 2019-04-03 DIAGNOSIS — I10 Essential (primary) hypertension: Secondary | ICD-10-CM | POA: Insufficient documentation

## 2019-04-03 DIAGNOSIS — E039 Hypothyroidism, unspecified: Secondary | ICD-10-CM | POA: Insufficient documentation

## 2019-04-03 DIAGNOSIS — Z85828 Personal history of other malignant neoplasm of skin: Secondary | ICD-10-CM | POA: Diagnosis not present

## 2019-04-03 DIAGNOSIS — Z8673 Personal history of transient ischemic attack (TIA), and cerebral infarction without residual deficits: Secondary | ICD-10-CM | POA: Insufficient documentation

## 2019-04-03 DIAGNOSIS — T887XXA Unspecified adverse effect of drug or medicament, initial encounter: Secondary | ICD-10-CM | POA: Diagnosis not present

## 2019-04-03 DIAGNOSIS — Z79899 Other long term (current) drug therapy: Secondary | ICD-10-CM | POA: Insufficient documentation

## 2019-04-03 DIAGNOSIS — T50905A Adverse effect of unspecified drugs, medicaments and biological substances, initial encounter: Secondary | ICD-10-CM

## 2019-04-03 DIAGNOSIS — R112 Nausea with vomiting, unspecified: Secondary | ICD-10-CM | POA: Diagnosis present

## 2019-04-03 LAB — CBC WITH DIFFERENTIAL/PLATELET
Abs Immature Granulocytes: 0.03 10*3/uL (ref 0.00–0.07)
Basophils Absolute: 0 10*3/uL (ref 0.0–0.1)
Basophils Relative: 0 %
Eosinophils Absolute: 0 10*3/uL (ref 0.0–0.5)
Eosinophils Relative: 0 %
HCT: 40.7 % (ref 36.0–46.0)
Hemoglobin: 13.1 g/dL (ref 12.0–15.0)
Immature Granulocytes: 0 %
Lymphocytes Relative: 13 %
Lymphs Abs: 1.3 10*3/uL (ref 0.7–4.0)
MCH: 29.5 pg (ref 26.0–34.0)
MCHC: 32.2 g/dL (ref 30.0–36.0)
MCV: 91.7 fL (ref 80.0–100.0)
Monocytes Absolute: 0.5 10*3/uL (ref 0.1–1.0)
Monocytes Relative: 5 %
Neutro Abs: 7.9 10*3/uL — ABNORMAL HIGH (ref 1.7–7.7)
Neutrophils Relative %: 82 %
Platelets: 266 10*3/uL (ref 150–400)
RBC: 4.44 MIL/uL (ref 3.87–5.11)
RDW: 12.9 % (ref 11.5–15.5)
WBC: 9.8 10*3/uL (ref 4.0–10.5)
nRBC: 0 % (ref 0.0–0.2)

## 2019-04-03 LAB — COMPREHENSIVE METABOLIC PANEL
ALT: 19 U/L (ref 0–44)
AST: 26 U/L (ref 15–41)
Albumin: 3.7 g/dL (ref 3.5–5.0)
Alkaline Phosphatase: 73 U/L (ref 38–126)
Anion gap: 7 (ref 5–15)
BUN: 14 mg/dL (ref 8–23)
CO2: 25 mmol/L (ref 22–32)
Calcium: 8.9 mg/dL (ref 8.9–10.3)
Chloride: 108 mmol/L (ref 98–111)
Creatinine, Ser: 0.6 mg/dL (ref 0.44–1.00)
GFR calc Af Amer: >60 mL/min (ref >60)
GFR calc non Af Amer: >60 mL/min (ref >60)
Glucose, Bld: 134 mg/dL — ABNORMAL HIGH (ref 70–99)
Potassium: 4.6 mmol/L (ref 3.5–5.1)
Sodium: 140 mmol/L (ref 135–145)
Total Bilirubin: 1 mg/dL (ref 0.3–1.2)
Total Protein: 7 g/dL (ref 6.5–8.1)

## 2019-04-03 LAB — LIPASE, BLOOD: Lipase: 30 U/L (ref 11–51)

## 2019-04-03 MED ORDER — SODIUM CHLORIDE 0.9 % IV SOLN: INTRAVENOUS | Status: DC

## 2019-04-03 MED ORDER — LORAZEPAM 2 MG/ML IJ SOLN
0.5000 mg | Freq: Once | INTRAMUSCULAR | Status: AC
  Administered 2019-04-03: 0.5 mg via INTRAVENOUS
  Filled 2019-04-03: qty 1

## 2019-04-03 MED ORDER — SODIUM CHLORIDE 0.9 % IV BOLUS
1000.0000 mL | Freq: Once | INTRAVENOUS | Status: AC
  Administered 2019-04-03: 1000 mL via INTRAVENOUS

## 2019-04-03 NOTE — ED Provider Notes (Signed)
[facility] DEPT Provider Note   CSN: [id number] Arrival date & time: 04/03/19  2046     History Chief Complaint  Patient presents with  . Nausea    Darlene [name] is a 86 y.o. female.  86 year old female who presents with nausea and emesis after taking Percocet and amoxicillin states that she is had the symptoms since start the medications yesterday.  Has been well to manage them until this evening when she started having emesis.  She had had a chicken sandwich but did regurgitate that.  Denies any fever or chills.  Denies any dyspnea or feeling of her throat closing.  No rashes were noted.  EMS was called given Zofran and transported here.  States that her nausea has now improved        Past Medical History:  Diagnosis Date  . Anxiety   . Asthma 04/06/2018  . Atrial fibrillation ([place])   . Colon polyps   . Depression   . Hyperlipidemia   . Hypertension   . Hypothyroidism   . Skin cancer   . Sleep apnea   . Stroke [facility]) 2008    Patient Active Problem List   Diagnosis Date Noted  . Insomnia 03/29/2019  . Back pain with left-sided sciatica 03/01/2019  . Daytime sleepiness 11/30/2018  . History of colonic polyps 09/03/2018  . Dysphagia 09/03/2018  . Pyrosis 09/03/2018  . Chronic sore throat 09/03/2018  . Asthma 04/06/2018  . Abnormal weight gain 02/23/2018  . Actinic keratoses 02/23/2018  . Seasonal and perennial allergic rhinitis 01/31/2018  . Incontinent of urine 12/01/2017  . Essential hypertension 09/06/2017  . History of CVA (cerebrovascular accident) 09/06/2017  . Hyperlipidemia 09/06/2017  . GAD (generalized anxiety disorder) 09/06/2017  . Acquired hypothyroidism 09/06/2017  . Chronic sinusitis 09/06/2017  . Gastroesophageal reflux disease 09/06/2017    Past Surgical History:  Procedure Laterality Date  . ADENOIDECTOMY    . APPENDECTOMY  1960  . [place] SURGERY  2014  . MESH APPLIED TO LAP PORT    . TONSILLECTOMY   1940     OB History   No obstetric history on file.     Family History  Problem Relation Age of Onset  . Lung cancer Mother 33       smoker  . Heart disease Mother   . Stroke Father 62  . Allergic rhinitis Sister   . CVA Brother   . Lung cancer Maternal Grandfather   . Heart attack Paternal Grandfather   . Psoriasis Daughter   . Rheum arthritis Daughter   . Bipolar disorder Daughter     Social History   Tobacco Use  . Smoking status: Never Smoker  . Smokeless tobacco: Never Used  Substance Use Topics  . Alcohol use: Yes    Alcohol/week: 2.0 standard drinks    Types: 2 Glasses of wine per week  . Drug use: Never    Home Medications Prior to Admission medications   Medication Sig Start Date End Date Taking? Authorizing Provider  acetaminophen (TYLENOL) 325 MG tablet Take 650 mg by mouth as needed (for sinus headache).    [provider]  azelastine (ASTELIN) 0.1 % nasal spray Place 2 sprays into both nostrils 2 (two) times daily. 01/31/18   Valentina Shaggy, MD  clopidogrel (PLAVIX) 75 MG tablet TAKE 1 TABLET BY MOUTH EVERY DAY Patient taking differently: Take 75 mg by mouth daily.  12/01/18   Virgie Dad, MD  diazepam (VALIUM) 5 MG  tablet Take 2.5 mg by mouth as needed (anxiety).     [provider]  levothyroxine (SYNTHROID, LEVOTHROID) 100 MCG tablet Take 100 mcg by mouth daily before breakfast.    [provider]  losartan (COZAAR) 100 MG tablet TAKE 1/2 TABLET BY MOUTH EVERY DAY Patient taking differently: Take 50 mg by mouth daily.  09/14/18   [name], [name], NP  mupirocin ointment (BACTROBAN) 2 % Apply 1 application topically daily. 02/16/19   [provider]  omeprazole (PRILOSEC) 40 MG capsule TAKE 1 CAPSULE BY MOUTH TWICE A DAY FOR 3 MONTHS 01/22/19   Mansouraty, Telford Nab., MD  pravastatin (PRAVACHOL) 40 MG tablet Take 40 mg by mouth at bedtime.     [provider]  sodium chloride (OCEAN) 0.65 % SOLN nasal spray  Place 1 spray into both nostrils as needed for congestion.    [provider]  triamcinolone cream (KENALOG) 0.1 % Apply 1 application topically at bedtime. Apply to chest 01/24/19   [provider]  Zolpidem Tartrate 3.5 MG SUBL Place 3.5 mg under the tongue at bedtime as needed. 03/29/19   [name], [name], NP    Allergies    Dust mite extract, Oxycontin [oxycodone hcl], and Pollen extract  Review of Systems   Review of Systems  All other systems reviewed and are negative.   Physical Exam Updated Vital Signs BP (!) 146/72 (BP Location: Left Arm)   Pulse 61   Temp 97.8 F (36.6 C) (Oral)   Resp 18   Ht 1.6 m (5\' 3" )   Wt 71.7 kg   SpO2 99%   BMI 27.99 kg/m   Physical Exam Vitals and nursing note reviewed.  Constitutional:      General: She is not in acute distress.    Appearance: Normal appearance. She is well-developed. She is not toxic-appearing.  HENT:     Head: Normocephalic and atraumatic.  Eyes:     General: Lids are normal.     Conjunctiva/sclera: Conjunctivae normal.     Pupils: Pupils are equal, round, and reactive to light.  Neck:     Thyroid: No thyroid mass.     Trachea: No tracheal deviation.  Cardiovascular:     Rate and Rhythm: Normal rate and regular rhythm.     Heart sounds: Normal heart sounds. No murmur. No gallop.   Pulmonary:     Effort: Pulmonary effort is normal. No respiratory distress.     Breath sounds: Normal breath sounds. No stridor. No decreased breath sounds, wheezing, rhonchi or rales.  Abdominal:     General: Bowel sounds are normal. There is no distension.     Palpations: Abdomen is soft.     Tenderness: There is no abdominal tenderness. There is no rebound.  Musculoskeletal:        General: No tenderness. Normal range of motion.     Cervical back: Normal range of motion and neck supple.  Skin:    General: Skin is warm and dry.     Findings: No abrasion or rash.  Neurological:     Mental Status: She is alert and  oriented to person, place, and time.     GCS: GCS eye subscore is 4. GCS verbal subscore is 5. GCS motor subscore is 6.     Cranial Nerves: No cranial nerve deficit.     Sensory: No sensory deficit.  Psychiatric:        Speech: Speech normal.        Behavior:  Behavior normal.     ED Results / Procedures / Treatments   Labs (all labs ordered are listed, but only abnormal results are displayed) Labs Reviewed  CBC WITH DIFFERENTIAL/PLATELET  COMPREHENSIVE METABOLIC PANEL  LIPASE, BLOOD    EKG None  Radiology No results found.  Procedures Procedures (including critical care time)  Medications Ordered in ED Medications  sodium chloride 0.9 % bolus 1,000 mL (has no administration in time range)  0.9 %  sodium chloride infusion (has no administration in time range)    ED Course  I have reviewed the triage vital signs and the nursing notes.  Pertinent labs & imaging results that were available during my care of the patient were reviewed by me and considered in my medical decision making (see chart for details).    MDM Rules/Calculators/A&P                      Patient given IV fluids as well as 0.05 Ativan and feels better.  Denies any nausea at this time.  Is stable for discharge Final Clinical Impression(s) / ED Diagnoses Final diagnoses:  None    Rx / DC Orders ED Discharge Orders    None       Lacretia Leigh, MD 04/03/19 2306

## 2019-04-03 NOTE — ED Triage Notes (Addendum)
Per ems: pt coming in from [facility] c/o n/v.  Pt took hydrocodone and amoxicillin on an empty stomach. No shortness of breath or trouble swallowing or rash. Allergic to OxyContin. Had surgery on mouth last Wednesday    4mg  of zofran given

## 2019-04-03 NOTE — Discharge Instructions (Addendum)
Make sure that you take your medications with food on your stomach.  Follow-up with your doctor as needed

## 2019-04-03 NOTE — ED Notes (Signed)
Pt verbalized d/c instructions and follow up care. Alert and ambulatory. No iv. Leaving with yellow cab

## 2019-04-07 ENCOUNTER — Emergency Department (HOSPITAL_COMMUNITY)
Admission: EM | Admit: 2019-04-07 | Discharge: 2019-04-08 | Disposition: A | Discharge status: Home / Self Care | Payer: Medicare Other | Attending: Emergency Medicine | Admitting: Emergency Medicine

## 2019-04-07 ENCOUNTER — Encounter (HOSPITAL_COMMUNITY): Payer: Self-pay | Admitting: Emergency Medicine

## 2019-04-07 ENCOUNTER — Other Ambulatory Visit: Payer: Self-pay

## 2019-04-07 DIAGNOSIS — Z79899 Other long term (current) drug therapy: Secondary | ICD-10-CM | POA: Insufficient documentation

## 2019-04-07 DIAGNOSIS — K0889 Other specified disorders of teeth and supporting structures: Secondary | ICD-10-CM | POA: Diagnosis not present

## 2019-04-07 DIAGNOSIS — E039 Hypothyroidism, unspecified: Secondary | ICD-10-CM | POA: Insufficient documentation

## 2019-04-07 DIAGNOSIS — I10 Essential (primary) hypertension: Secondary | ICD-10-CM | POA: Diagnosis not present

## 2019-04-07 DIAGNOSIS — H81399 Other peripheral vertigo, unspecified ear: Secondary | ICD-10-CM | POA: Insufficient documentation

## 2019-04-07 DIAGNOSIS — F329 Major depressive disorder, single episode, unspecified: Secondary | ICD-10-CM | POA: Insufficient documentation

## 2019-04-07 DIAGNOSIS — F32A Depression, unspecified: Secondary | ICD-10-CM

## 2019-04-07 DIAGNOSIS — R519 Headache, unspecified: Secondary | ICD-10-CM | POA: Diagnosis present

## 2019-04-07 DIAGNOSIS — J45909 Unspecified asthma, uncomplicated: Secondary | ICD-10-CM | POA: Insufficient documentation

## 2019-04-07 DIAGNOSIS — Z7902 Long term (current) use of antithrombotics/antiplatelets: Secondary | ICD-10-CM | POA: Insufficient documentation

## 2019-04-07 LAB — CBC WITH DIFFERENTIAL/PLATELET
Abs Immature Granulocytes: 0.03 10*3/uL (ref 0.00–0.07)
Basophils Absolute: 0 10*3/uL (ref 0.0–0.1)
Basophils Relative: 0 %
Eosinophils Absolute: 0.1 10*3/uL (ref 0.0–0.5)
Eosinophils Relative: 1 %
HCT: 41.8 % (ref 36.0–46.0)
Hemoglobin: 14 g/dL (ref 12.0–15.0)
Immature Granulocytes: 0 %
Lymphocytes Relative: 30 %
Lymphs Abs: 2.8 10*3/uL (ref 0.7–4.0)
MCH: 29.7 pg (ref 26.0–34.0)
MCHC: 33.5 g/dL (ref 30.0–36.0)
MCV: 88.7 fL (ref 80.0–100.0)
Monocytes Absolute: 0.6 10*3/uL (ref 0.1–1.0)
Monocytes Relative: 6 %
Neutro Abs: 5.7 10*3/uL (ref 1.7–7.7)
Neutrophils Relative %: 63 %
Platelets: 329 10*3/uL (ref 150–400)
RBC: 4.71 MIL/uL (ref 3.87–5.11)
RDW: 12.4 % (ref 11.5–15.5)
WBC: 9.3 10*3/uL (ref 4.0–10.5)
nRBC: 0 % (ref 0.0–0.2)

## 2019-04-07 LAB — BASIC METABOLIC PANEL
Anion gap: 10 (ref 5–15)
BUN: 12 mg/dL (ref 8–23)
CO2: 23 mmol/L (ref 22–32)
Calcium: 9.6 mg/dL (ref 8.9–10.3)
Chloride: 107 mmol/L (ref 98–111)
Creatinine, Ser: 0.8 mg/dL (ref 0.44–1.00)
GFR calc Af Amer: >60 mL/min (ref >60)
GFR calc non Af Amer: >60 mL/min (ref >60)
Glucose, Bld: 122 mg/dL — ABNORMAL HIGH (ref 70–99)
Potassium: 3.8 mmol/L (ref 3.5–5.1)
Sodium: 140 mmol/L (ref 135–145)

## 2019-04-07 MED ORDER — MECLIZINE HCL 25 MG PO TABS
25.0000 mg | ORAL_TABLET | Freq: Once | ORAL | Status: AC
  Administered 2019-04-07: 25 mg via ORAL
  Filled 2019-04-07: qty 1

## 2019-04-07 MED ORDER — ONDANSETRON HCL 4 MG PO TABS: 4.0000 mg | ORAL_TABLET | Freq: Four times a day (QID) | ORAL | 0 refills | Status: DC | PRN

## 2019-04-07 MED ORDER — CLINDAMYCIN HCL 150 MG PO CAPS: 150.0000 mg | ORAL_CAPSULE | Freq: Four times a day (QID) | ORAL | 0 refills | Status: DC

## 2019-04-07 MED ORDER — CLINDAMYCIN HCL 150 MG PO CAPS
150.0000 mg | ORAL_CAPSULE | Freq: Once | ORAL | Status: AC
  Administered 2019-04-07: 150 mg via ORAL
  Filled 2019-04-07: qty 1

## 2019-04-07 MED ORDER — MECLIZINE HCL 25 MG PO TABS: 25.0000 mg | ORAL_TABLET | Freq: Three times a day (TID) | ORAL | 0 refills | Status: DC | PRN

## 2019-04-07 NOTE — Discharge Instructions (Addendum)
Take acetaminophen as needed for pain.  If you need additional pain relief, take ibuprofen 400 mg every six hours. Take this IN ADDITION to the acetaminophen.  Please follow up with your periodontist.

## 2019-04-07 NOTE — ED Provider Notes (Signed)
[facility] EMERGENCY DEPARTMENT Provider Note   CSN: [id number] Arrival date & time: 04/07/19  2032     History Chief Complaint  Patient presents with  . Right Facial Pain    Darlene [name] is a 86 y.o. female.  The history is provided by the patient.  She has history of hypertension, hyperlipidemia, atrial fibrillation, stroke and comes in because of oral pain.  About 11 days ago, she had a dental extraction and had a tooth prepared for crown.  She was not put on antibiotics initially but when she had significant oral pain, she was started on amoxicillin and given hydrocodone-acetaminophen for pain.  She had tremendous nausea from that and was seen in the ED and since then she has not taken anything for pain.  She is complaining of ongoing oral pain.  She denies fever but has had some chills and sweats.  She is also complaining of vertigo with the room spinning, especially when she lays down.  Additional complaint is depression.  Her husband died about 2 months ago.  She denies suicidal ideation.   Past Medical History:  Diagnosis Date  . Anxiety   . Asthma 04/06/2018  . Atrial fibrillation ([place])   . Colon polyps   . Depression   . Hyperlipidemia   . Hypertension   . Hypothyroidism   . Skin cancer   . Sleep apnea   . Stroke [facility]) 2008    Patient Active Problem List   Diagnosis Date Noted  . Insomnia 03/29/2019  . Back pain with left-sided sciatica 03/01/2019  . Daytime sleepiness 11/30/2018  . History of colonic polyps 09/03/2018  . Dysphagia 09/03/2018  . Pyrosis 09/03/2018  . Chronic sore throat 09/03/2018  . Asthma 04/06/2018  . Abnormal weight gain 02/23/2018  . Actinic keratoses 02/23/2018  . Seasonal and perennial allergic rhinitis 01/31/2018  . Incontinent of urine 12/01/2017  . Essential hypertension 09/06/2017  . History of CVA (cerebrovascular accident) 09/06/2017  . Hyperlipidemia 09/06/2017  . GAD (generalized anxiety disorder)  09/06/2017  . Acquired hypothyroidism 09/06/2017  . Chronic sinusitis 09/06/2017  . Gastroesophageal reflux disease 09/06/2017    Past Surgical History:  Procedure Laterality Date  . ADENOIDECTOMY    . APPENDECTOMY  1960  . [place] SURGERY  2014  . MESH APPLIED TO LAP PORT    . TONSILLECTOMY  1940     OB History   No obstetric history on file.     Family History  Problem Relation Age of Onset  . Lung cancer Mother 68       smoker  . Heart disease Mother   . Stroke Father 16  . Allergic rhinitis Sister   . CVA Brother   . Lung cancer Maternal Grandfather   . Heart attack Paternal Grandfather   . Psoriasis Daughter   . Rheum arthritis Daughter   . Bipolar disorder Daughter     Social History   Tobacco Use  . Smoking status: Never Smoker  . Smokeless tobacco: Never Used  Substance Use Topics  . Alcohol use: Yes    Alcohol/week: 2.0 standard drinks    Types: 2 Glasses of wine per week  . Drug use: Never    Home Medications Prior to Admission medications   Medication Sig Start Date End Date Taking? Authorizing Provider  acetaminophen (TYLENOL) 325 MG tablet Take 650 mg by mouth as needed (for sinus headache).    [provider]  azelastine (ASTELIN) 0.1 % nasal spray Place  2 sprays into both nostrils 2 (two) times daily. 01/31/18   Valentina Shaggy, MD  clopidogrel (PLAVIX) 75 MG tablet TAKE 1 TABLET BY MOUTH EVERY DAY Patient taking differently: Take 75 mg by mouth daily.  12/01/18   Virgie Dad, MD  diazepam (VALIUM) 5 MG tablet Take 2.5 mg by mouth as needed (anxiety).     [provider]  levothyroxine (SYNTHROID, LEVOTHROID) 100 MCG tablet Take 100 mcg by mouth daily before breakfast.    [provider]  losartan (COZAAR) 100 MG tablet TAKE 1/2 TABLET BY MOUTH EVERY DAY Patient taking differently: Take 50 mg by mouth daily.  09/14/18   [name], [name], NP  mupirocin ointment (BACTROBAN) 2 % Apply 1 application topically  daily. 02/16/19   [provider]  omeprazole (PRILOSEC) 40 MG capsule TAKE 1 CAPSULE BY MOUTH TWICE A DAY FOR 3 MONTHS 01/22/19   Mansouraty, Telford Nab., MD  pravastatin (PRAVACHOL) 40 MG tablet Take 40 mg by mouth at bedtime.     [provider]  sodium chloride (OCEAN) 0.65 % SOLN nasal spray Place 1 spray into both nostrils as needed for congestion.    [provider]  triamcinolone cream (KENALOG) 0.1 % Apply 1 application topically at bedtime. Apply to chest 01/24/19   [provider]  Zolpidem Tartrate 3.5 MG SUBL Place 3.5 mg under the tongue at bedtime as needed. 03/29/19   [name], [name], NP    Allergies    Dust mite extract, Oxycontin [oxycodone hcl], and Pollen extract  Review of Systems   Review of Systems  All other systems reviewed and are negative.   Physical Exam Updated Vital Signs BP (!) 159/65   Pulse 70   Temp 98.5 F (36.9 C) (Oral)   Resp 18   Ht 5\' 3"  (1.6 m)   Wt 80 kg   SpO2 100%   BMI 31.24 kg/m   Physical Exam Vitals and nursing note reviewed.   86 year old female, resting comfortably and in no acute distress. Vital signs are significant for elevated blood pressure. Oxygen saturation is 100%, which is normal. Head is normocephalic and atraumatic. PERRLA, EOMI. Oropharynx is clear. Several teeth have been extracted, no evidence of ongoing infection - no reythme, swelling, drainage. Neck is nontender and supple without adenopathy or JVD. Back is nontender and there is no CVA tenderness. Lungs are clear without rales, wheezes, or rhonchi. Chest is nontender. Heart has regular rate and rhythm without murmur. Abdomen is soft, flat, nontender without masses or hepatosplenomegaly and peristalsis is normoactive. Extremities have no cyanosis or edema, full range of motion is present. Skin is warm and dry without rash. Neurologic: Mental status is normal, cranial nerves are intact, there are no motor or sensory deficits.  Dizziness is reproduced by passive head movement  ED Results / Procedures / Treatments   Labs (all labs ordered are listed, but only abnormal results are displayed) Labs Reviewed  BASIC METABOLIC PANEL - Abnormal; Notable for the following components:      Result Value   Glucose, Bld 122 (*)    All other components within normal limits  CBC WITH DIFFERENTIAL/PLATELET   Procedures Procedures  Medications Ordered in ED Medications  clindamycin (CLEOCIN) capsule 150 mg (150 mg Oral Given 04/07/19 2344)  meclizine (ANTIVERT) tablet 25 mg (25 mg Oral Given 04/07/19 2344)    ED Course  I have reviewed the triage vital signs and the nursing notes.  Pertinent lab results that  were available during my care of the patient were reviewed by me and considered in my medical decision making (see chart for details).  MDM Rules/Calculators/A&P Persistent oral pain following dental extraction. No clinical sign of infection, but will switch antibiotic ot clindamycin.  Labs are unremarkable.  Will give meclizine for vertigo, ondansetron for nausea. Follow up with her periodontist. Referred to [facility] for depression. Old records are reviewed showing ED visit this past week for nausea secondary to hyrocodone.  Final Clinical Impression(s) / ED Diagnoses Final diagnoses:  Pain, dental  Peripheral vertigo, unspecified laterality  Depression, unspecified depression type    Rx / DC Orders ED Discharge Orders         Ordered    clindamycin (CLEOCIN) 150 MG capsule  Every 6 hours     04/07/19 2321    meclizine (ANTIVERT) 25 MG tablet  3 times daily PRN     04/07/19 2322    ondansetron (ZOFRAN) 4 MG tablet  Every 6 hours PRN     04/07/19 [id number]           Delora Fuel, MD [id number]

## 2019-04-07 NOTE — ED Triage Notes (Signed)
Patient reports persistent right facial pain with mild swelling after dental surgery 1 1/2 weeks ago unrelieved by OTC pain medication , patient added left lateral neck stiffness. Denies fever or chills .

## 2019-04-09 ENCOUNTER — Telehealth: Payer: Self-pay

## 2019-04-09 NOTE — Telephone Encounter (Signed)
Patient called and stated she believed she is going into A-fib. She stated that her blood pressure getting higher and she has been exhibiting some dizziness. She stated she had a consult with you. Please advise. Thanks!

## 2019-04-09 NOTE — Telephone Encounter (Signed)
She was seen for dizziness in the ED. Does she need an EKG or need to be seen. Also if symptoms are on and off, she could wean a monitor for 2 weeks. And I can either see her after or call her with findings. JG

## 2019-04-10 ENCOUNTER — Telehealth: Payer: Self-pay

## 2019-04-12 ENCOUNTER — Encounter: Payer: Self-pay | Admitting: Cardiology

## 2019-04-12 ENCOUNTER — Ambulatory Visit: Payer: Medicare Other | Admitting: Cardiology

## 2019-04-12 ENCOUNTER — Other Ambulatory Visit: Payer: Self-pay

## 2019-04-12 VITALS — BP 133/62 | HR 82 | Temp 98.4°F | Ht 63.0 in | Wt 154.0 lb

## 2019-04-12 DIAGNOSIS — Z8673 Personal history of transient ischemic attack (TIA), and cerebral infarction without residual deficits: Secondary | ICD-10-CM

## 2019-04-12 DIAGNOSIS — R5381 Other malaise: Secondary | ICD-10-CM

## 2019-04-12 DIAGNOSIS — E7800 Pure hypercholesterolemia, unspecified: Secondary | ICD-10-CM

## 2019-04-12 DIAGNOSIS — R002 Palpitations: Secondary | ICD-10-CM

## 2019-04-12 DIAGNOSIS — I10 Essential (primary) hypertension: Secondary | ICD-10-CM

## 2019-04-12 DIAGNOSIS — R5383 Other fatigue: Secondary | ICD-10-CM

## 2019-04-12 MED ORDER — METOPROLOL TARTRATE 25 MG PO TABS: 25.0000 mg | ORAL_TABLET | Freq: Two times a day (BID) | ORAL | 2 refills | Status: DC

## 2019-04-12 NOTE — Progress Notes (Signed)
Primary Physician/Referring:  [name], [name], NP  Patient ID: Darlene [name], female    DOB: Jun 09, 1933, 86 y.o.   MRN: [id number]  Chief Complaint  Patient presents with  . Atrial Fibrillation  . Follow-up    1 year, dizzy  . Palpitations   HPI:    Darlene [name]  is a 86 y.o. Caucasian female with hypertension, hyperlipidemia, h/o sleep apnea not on CPAP, prior history of stroke in 2006 without residual defect, on Plavix chronically and there was no diagnosis of atrial fibrillation. She was seen in the emergency room on 04/07/2019 when she presented with dizziness.  Patient also had dental work-up done and she had received hydrocodone for pain, hence was extremely nauseous and also continued to have oral pain.  Of note her husband died 2 months ago.  She was discharged home and advised to follow-up with me.   Over the past 1 months she is having frequent episodes of palpitations, described as fluttering in her chest that occurs mostly in the night and when she wakes up in the morning.  She was diagnosed with sleep apnea years ago but not on CPAP.  Past Medical History:  Diagnosis Date  . Anxiety   . Asthma 04/06/2018  . Colon polyps   . Depression   . Hyperlipidemia   . Hypertension   . Hypothyroidism   . Skin cancer   . Sleep apnea   . Stroke [facility]) 2008   Past Surgical History:  Procedure Laterality Date  . ADENOIDECTOMY    . APPENDECTOMY  1960  . COLONOSCOPY    . GUM SURGERY  03/2019  . [place] SURGERY  2014  . MESH APPLIED TO LAP PORT    . TONSILLECTOMY  1940   Family History  Problem Relation Age of Onset  . Lung cancer Mother 80       smoker  . Heart disease Mother   . Stroke Father 26  . Allergic rhinitis Sister   . CVA Brother   . Lung cancer Maternal Grandfather   . Heart attack Paternal Grandfather   . Psoriasis Daughter   . Rheum arthritis Daughter   . Bipolar disorder Daughter     Social History   Tobacco Use  . Smoking status: Never Smoker  .  Smokeless tobacco: Never Used  Substance Use Topics  . Alcohol use: Yes    Alcohol/week: 2.0 standard drinks    Types: 2 Glasses of wine per week   ROS  ROS Objective  Blood pressure 133/62, pulse 82, temperature 98.4 F (36.9 C), height 5\' 3"  (1.6 m), weight 154 lb (69.9 kg), SpO2 96 %.  Vitals with BMI 04/12/2019 04/12/2019 04/12/2019  Height - - 5\' 3"   Weight - - 154 lbs  BMI - - 123456  Systolic Q000111Q [id number] Q000111Q  Diastolic 62 59 64  Pulse 82 76 73     Physical Exam Laboratory examination:   Recent Labs    02/20/19 1729 04/03/19 2126 04/07/19 2048  NA 139 140 140  K 4.0 4.6 3.8  CL 107 108 107  CO2 24 25 23   GLUCOSE 117* 134* 122*  BUN 13 14 12   CREATININE 0.96 0.60 0.80  CALCIUM 9.3 8.9 9.6  GFRNONAA 55* >60 >60  GFRAA >60 >60 >60   estimated creatinine clearance is 50 mL/min (by C-G formula based on SCr of 0.8 mg/dL).  CMP Latest Ref Rng & Units 04/07/2019 04/03/2019 02/20/2019  Glucose 70 - 99 mg/dL 122(H) 134(H) 117(H)  BUN 8 - 23 mg/dL 12 14 13   Creatinine 0.44 - 1.00 mg/dL 0.80 0.60 0.96  Sodium 135 - 145 mmol/L 140 140 139  Potassium 3.5 - 5.1 mmol/L 3.8 4.6 4.0  Chloride 98 - 111 mmol/L 107 108 107  CO2 22 - 32 mmol/L 23 25 24   Calcium 8.9 - 10.3 mg/dL 9.6 8.9 9.3  Total Protein 6.5 - 8.1 g/dL - 7.0 6.9  Total Bilirubin 0.3 - 1.2 mg/dL - 1.0 0.7  Alkaline Phos 38 - 126 U/L - 73 72  AST 15 - 41 U/L - 26 17  ALT 0 - 44 U/L - 19 17   CBC Latest Ref Rng & Units 04/07/2019 04/03/2019 02/20/2019  WBC 4.0 - 10.5 K/uL 9.3 9.8 6.4  Hemoglobin 12.0 - 15.0 g/dL 14.0 13.1 14.0  Hematocrit 36.0 - 46.0 % 41.8 40.7 43.1  Platelets 150 - 400 K/uL 329 266 235   Lipid Panel     Component Value Date/Time   CHOL 191 12/05/2018 0700   TRIG 127 12/05/2018 0700   HDL 48 (L) 12/05/2018 0700   CHOLHDL 4.0 12/05/2018 0700   LDLCALC 119 (H) 12/05/2018 0700   Recent Labs    12/05/18 0700  TSH 1.02   Medications and allergies   Allergies  Allergen Reactions  . Dust Mite  Extract   . Oxycontin [Oxycodone Hcl] Nausea And Vomiting  . Pollen Extract      Current Outpatient Medications  Medication Instructions  . acetaminophen (TYLENOL) 650 mg, Oral, As needed  . azelastine (ASTELIN) 0.1 % nasal spray 2 sprays, Each Nare, 2 times daily  . clopidogrel (PLAVIX) 75 MG tablet TAKE 1 TABLET BY MOUTH EVERY DAY  . diazepam (VALIUM) 2.5 mg, Oral, As needed  . levothyroxine (SYNTHROID) 100 mcg, Oral, Daily before breakfast  . losartan (COZAAR) 100 MG tablet TAKE 1/2 TABLET BY MOUTH EVERY DAY  . metoprolol tartrate (LOPRESSOR) 25 mg, Oral, 2 times daily  . omeprazole (PRILOSEC) 40 MG capsule TAKE 1 CAPSULE BY MOUTH TWICE A DAY FOR 3 MONTHS  . pravastatin (PRAVACHOL) 40 mg, Oral, Daily at bedtime  . sodium chloride (OCEAN) 0.65 % SOLN nasal spray 1 spray, Each Nare, As needed  . triamcinolone cream (KENALOG) 0.1 % 1 application, Topical, Daily at bedtime, Apply to chest  . Zolpidem Tartrate 3.5 mg, Sublingual, At bedtime PRN   Radiology:   No results found.  Cardiac Studies:   Stress Echocardiogram Jun 17, 2017:  Resting EKG demonstrated normal sinus rhythm. Stress EKG is negative for myocardial ischemia, patient exercised for 4:29 minutes, achieved 6.2 Mets. Normal blood pressure response. No evidence of ischemia. Stress echocardiogram: Normal LV systolic function, no stress induced wall motion abnormality. EF 75-80%.  EKG  EKG 04/12/2019: Normal sinus rhythm with rate of 75 bpm, left atrial enlargement, normal axis.  Nonspecific anterolateral T wave normality, cannot exclude ischemia.  Compared to 12/21/2017, no significant change.  Assessment     ICD-10-CM   1. Palpitations  R00.2 EKG 12-Lead    Pulse oximetry, overnight    LONG TERM MONITOR-LIVE TELEMETRY (3-14 DAYS)    metoprolol tartrate (LOPRESSOR) 25 MG tablet  2. Essential hypertension  I10 PCV ECHOCARDIOGRAM COMPLETE  3. Pure hypercholesterolemia  E78.00   4. History of cerebrovascular accident 2006   Z86.73 Pulse oximetry, overnight    LONG TERM MONITOR-LIVE TELEMETRY (3-14 DAYS)    PCV ECHOCARDIOGRAM COMPLETE  5. Malaise and fatigue  R53.81 Pulse oximetry, overnight   R53.83  Meds ordered this encounter  Medications  . metoprolol tartrate (LOPRESSOR) 25 MG tablet    Sig: Take 1 tablet (25 mg total) by mouth 2 (two) times daily.    Dispense:  60 tablet    Refill:  2    Medications Discontinued During This Encounter  Medication Reason  . amoxicillin (AMOXIL) 500 MG capsule Completed Course  . clindamycin (CLEOCIN) 150 MG capsule Patient has not taken in last 30 days  . meclizine (ANTIVERT) 25 MG tablet Patient has not taken in last 30 days  . ondansetron (ZOFRAN) 4 MG tablet Patient has not taken in last 30 days    Recommendations:   Arieyanna Bushey  is a  86 y.o. Caucasian female with hypertension, hyperlipidemia, prior history of stroke in 2006 without residual defect and has been on chronic plavix, no diagnosis of A. FIb but was discussed at that time. She is now worried about possible A. Fib with palpitations occurring fairly regularly at night and when she wakes up. She has been told to have obstructive sleep apnea but unable to tolerate CPAP, but is willing to try oxygen at night, will perform nocturnal oximetry and see if she would benefit from therapy.  She will need extended 14-day telemetry to evaluate for atrial fibrillation especially in view of prior history of stroke.  I will also perform an echocardiogram in view of hypertension and palpitations and stroke to evaluate for left atrial size and LV function.  She was already prescribed metoprolol for palpitations, but she has not started the medication until she saw me, she can resume this at 25 mg twice daily.  Her labs were reviewed, lipids are not well controlled and may need therapy for the same in view of CVA, she may need addition of Zetia or change to Crestor 20 mg daily which I will address on next visit.  I will  see her back in 6 weeks for follow-up.  Her medical records from recent emergency room visit evaluated, labs reviewed.  Adrian Prows, MD, [facility] 04/12/2019, 9:32 PM [facility] [place] Office: [phone]

## 2019-04-13 ENCOUNTER — Ambulatory Visit: Payer: Medicare Other

## 2019-04-19 ENCOUNTER — Encounter (INDEPENDENT_AMBULATORY_CARE_PROVIDER_SITE_OTHER): Payer: Self-pay | Admitting: Otolaryngology

## 2019-04-19 ENCOUNTER — Ambulatory Visit (INDEPENDENT_AMBULATORY_CARE_PROVIDER_SITE_OTHER): Payer: Medicare Other | Admitting: Otolaryngology

## 2019-04-19 ENCOUNTER — Other Ambulatory Visit: Payer: Self-pay

## 2019-04-19 VITALS — Temp 97.2°F

## 2019-04-19 DIAGNOSIS — J310 Chronic rhinitis: Secondary | ICD-10-CM

## 2019-04-19 LAB — PULMONARY FUNCTION TEST

## 2019-04-19 NOTE — Progress Notes (Signed)
HPI: Darlene [name] is a 86 y.o. female who returns today for evaluation of drainage predominantly from the right nostril.  This is generally clear but comes and goes at different times.  She also has some intermittent nasal congestion which is worse on the right side.  She has had no mucopurulent discharge although she has coughed up some yellow phlegm. No fever.  Past Medical History:  Diagnosis Date  . Anxiety   . Asthma 04/06/2018  . Colon polyps   . Depression   . Hyperlipidemia   . Hypertension   . Hypothyroidism   . Skin cancer   . Sleep apnea   . Stroke [facility]) 2008   Past Surgical History:  Procedure Laterality Date  . ADENOIDECTOMY    . APPENDECTOMY  1960  . COLONOSCOPY    . GUM SURGERY  03/2019  . [place] SURGERY  2014  . MESH APPLIED TO LAP PORT    . TONSILLECTOMY  1940   Social History   Socioeconomic History  . Marital status: Divorced    Spouse name: Not on file  . Number of children: 3  . Years of education: Not on file  . Highest education level: Not on file  Occupational History  . Occupation: retired Animal nutritionist  Tobacco Use  . Smoking status: Never Smoker  . Smokeless tobacco: Never Used  Substance and Sexual Activity  . Alcohol use: Yes    Alcohol/week: 2.0 standard drinks    Types: 2 Glasses of wine per week  . Drug use: Never  . Sexual activity: Not on file  Other Topics Concern  . Not on file  Social History Narrative   Social History      Diet? ok      Do you drink/eat things with caffeine? Weak coffee, (much milk)      Marital status?          divorced                          What year were you married? 1961      Do you live in a house, apartment, assisted living, condo, trailer, etc.? apartment      Is it one or more stories? 4      How many persons live in your home? Only me      Do you have any pets in your home? (please list) no      Highest level of education completed? 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             yes                         Type & how often? Walk- would love to return to water exercise      Advanced Directives      Do you have a living will? yes      Do you have a DNR form?           no                       If not, do you want to discuss one? no      Do you have signed POA/HPOA for forms? no      Functional Status  Do you have difficulty bathing or dressing yourself?  no      Do you have difficulty preparing food or eating? no      Do you have difficulty managing your medications? no      Do you have difficulty managing your finances?  no      Do you have difficulty affording your medications? no      Social Determinants of Health   Financial Resource Strain:   . Difficulty of Paying Living Expenses:   Food Insecurity:   . Worried About Charity fundraiser in the Last Year:   . Arboriculturist in the Last Year:   Transportation Needs:   . Film/video editor (Medical):   Marland Kitchen Lack of Transportation (Non-Medical):   Physical Activity:   . Days of Exercise per Week:   . Minutes of Exercise per Session:   Stress:   . Feeling of Stress :   Social Connections:   . Frequency of Communication with Friends and Family:   . Frequency of Social Gatherings with Friends and Family:   . Attends Religious Services:   . Active Member of Clubs or Organizations:   . Attends Archivist Meetings:   Marland Kitchen Marital Status:    Family History  Problem Relation Age of Onset  . Lung cancer Mother 73       smoker  . Heart disease Mother   . Stroke Father 36  . Allergic rhinitis Sister   . CVA Brother   . Lung cancer Maternal Grandfather   . Heart attack Paternal Grandfather   . Psoriasis Daughter   . Rheum arthritis Daughter   . Bipolar disorder Daughter    Allergies  Allergen Reactions  . Dust Mite Extract   . Oxycontin [Oxycodone Hcl] Nausea And Vomiting  . Pollen Extract     Prior to Admission medications   Medication Sig Start Date End Date Taking? Authorizing Provider  acetaminophen (TYLENOL) 325 MG tablet Take 650 mg by mouth as needed (for sinus headache).    [provider]  azelastine (ASTELIN) 0.1 % nasal spray Place 2 sprays into both nostrils 2 (two) times daily. 01/31/18   Valentina Shaggy, MD  clopidogrel (PLAVIX) 75 MG tablet TAKE 1 TABLET BY MOUTH EVERY DAY Patient taking differently: Take 75 mg by mouth daily.  12/01/18   Virgie Dad, MD  diazepam (VALIUM) 5 MG tablet Take 2.5 mg by mouth as needed (anxiety).     [provider]  levothyroxine (SYNTHROID, LEVOTHROID) 100 MCG tablet Take 100 mcg by mouth daily before breakfast.    [provider]  losartan (COZAAR) 100 MG tablet TAKE 1/2 TABLET BY MOUTH EVERY DAY Patient taking differently: Take 50 mg by mouth daily.  09/14/18   [name], [name], NP  metoprolol tartrate (LOPRESSOR) 25 MG tablet Take 1 tablet (25 mg total) by mouth 2 (two) times daily. 04/12/19 07/11/19  Adrian Prows, MD  omeprazole (PRILOSEC) 40 MG capsule TAKE 1 CAPSULE BY MOUTH TWICE A DAY FOR 3 MONTHS Patient taking differently: Take 40 mg by mouth in the morning and at bedtime.  01/22/19   Mansouraty, Telford Nab., MD  pravastatin (PRAVACHOL) 40 MG tablet Take 40 mg by mouth at bedtime.     [provider]  sodium chloride (OCEAN) 0.65 % SOLN nasal spray Place 1 spray into both nostrils as needed for congestion.    [provider]  triamcinolone cream (KENALOG) 0.1 % Apply 1  application topically at bedtime. Apply to chest 01/24/19   [provider]  Zolpidem Tartrate 3.5 MG SUBL Place 3.5 mg under the tongue at bedtime as needed. Patient not taking: Reported on 04/12/2019 03/29/19   [name], [name], NP     Positive ROS: Otherwise negative  All other systems have been reviewed and were otherwise negative with the exception of those mentioned in the HPI and as above.  Physical  Exam: Constitutional: Alert, well-appearing, no acute distress Ears: External ears without lesions or tenderness. Ear canals are clear bilaterally with intact, clear TMs.  Nasal: External nose without lesions. Septum relatively midline with clear mucous membranes on either side.  Both middle meatus regions were clear with no signs of infection.. Clear nasal passages.  No polyps.  Mucous membrane within the nasal cavity is clear. Oral: Lips and gums without lesions. Tongue and palate mucosa without lesions. Posterior oropharynx clear.  Patient status post tonsillectomy with clear posterior oropharynx Neck: No palpable adenopathy or masses Respiratory: Breathing comfortably  Skin: No facial/neck lesions or rash noted.  Procedures  Assessment: Allergic rhinitis  Plan: Patient is already using Flonase, azelastine nasal spray twice daily and occasional saline rinses.  She has not been using any antihistamines. Suggested using either Zyrtec, Claritin or Allegra. She will follow-up as needed   Radene Journey, MD

## 2019-04-23 ENCOUNTER — Other Ambulatory Visit: Payer: Medicare Other

## 2019-04-24 ENCOUNTER — Ambulatory Visit: Payer: Medicare Other

## 2019-04-24 ENCOUNTER — Other Ambulatory Visit: Payer: Self-pay

## 2019-04-24 DIAGNOSIS — Z8673 Personal history of transient ischemic attack (TIA), and cerebral infarction without residual deficits: Secondary | ICD-10-CM

## 2019-04-24 DIAGNOSIS — R002 Palpitations: Secondary | ICD-10-CM

## 2019-04-24 DIAGNOSIS — I10 Essential (primary) hypertension: Secondary | ICD-10-CM

## 2019-04-25 ENCOUNTER — Encounter: Payer: Self-pay | Admitting: Nurse Practitioner

## 2019-04-25 DIAGNOSIS — I5189 Other ill-defined heart diseases: Secondary | ICD-10-CM | POA: Insufficient documentation

## 2019-04-25 NOTE — Progress Notes (Signed)
Called and spoke with patient regarding her echocardiogram results.

## 2019-04-25 NOTE — Progress Notes (Signed)
Sent of home O2 and recheck in 1 month on O2 to see efficacy

## 2019-04-25 NOTE — Telephone Encounter (Signed)
Error

## 2019-04-27 ENCOUNTER — Telehealth: Payer: Self-pay

## 2019-04-27 NOTE — Telephone Encounter (Signed)
Adrian Prows, MD  Darlene [name], Darlene [name]  Will see what the monitor shows. Ask her to stay well hydrated.  JG       Previous Messages   ----- Message -----  From: [name]  Sent: 04/24/2019  3:46 PM EDT  To: Adrian Prows, MD  Subject: b/p reading                    Pt came to the office today for a 14 days ROCT. Pt mention that she has been dizzy with flutter in the mornings. Took her b/p and it was 105/73 pulse 73 LT arm. Then lying: 122/76 pulse 73, Sitting: 116/76 pulse, and standing: 116/76 pulse 80. Pt has an appt with you in 2 weeks. Please advise.

## 2019-04-27 NOTE — Telephone Encounter (Signed)
Patient called and stated that she no longer wants to wear the monitor because the strips are itching her very bad. 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 Please let us know so we can call her back. Thank you.

## 2019-04-27 NOTE — Telephone Encounter (Signed)
Called pt to inform her about the message Dr. Einar Gip send

## 2019-04-28 NOTE — Telephone Encounter (Signed)
Has she had any symptoms that she was taking about, palpitations. If she has then fine she can take it off.

## 2019-04-29 ENCOUNTER — Other Ambulatory Visit: Payer: Self-pay | Admitting: Internal Medicine

## 2019-04-29 ENCOUNTER — Other Ambulatory Visit: Payer: Self-pay | Admitting: Nurse Practitioner

## 2019-04-29 ENCOUNTER — Other Ambulatory Visit: Payer: Self-pay | Admitting: Gastroenterology

## 2019-04-29 DIAGNOSIS — R1310 Dysphagia, unspecified: Secondary | ICD-10-CM

## 2019-04-30 ENCOUNTER — Other Ambulatory Visit: Payer: Self-pay | Admitting: *Deleted

## 2019-04-30 MED ORDER — LEVOTHYROXINE SODIUM 100 MCG PO TABS: 100.0000 ug | ORAL_TABLET | Freq: Every day | ORAL | 1 refills | Status: DC

## 2019-04-30 NOTE — Telephone Encounter (Signed)
Patient requested refill Faxed to pharmacy.  

## 2019-05-01 NOTE — Telephone Encounter (Signed)
Called patient, no answer, will try again later.

## 2019-05-03 ENCOUNTER — Telehealth: Payer: Self-pay | Admitting: Cardiology

## 2019-05-03 ENCOUNTER — Other Ambulatory Visit: Payer: Self-pay | Admitting: Cardiology

## 2019-05-03 DIAGNOSIS — R5381 Other malaise: Secondary | ICD-10-CM

## 2019-05-03 DIAGNOSIS — I10 Essential (primary) hypertension: Secondary | ICD-10-CM

## 2019-05-03 DIAGNOSIS — G4734 Idiopathic sleep related nonobstructive alveolar hypoventilation: Secondary | ICD-10-CM

## 2019-05-03 DIAGNOSIS — R5383 Other fatigue: Secondary | ICD-10-CM

## 2019-05-03 NOTE — Telephone Encounter (Signed)
Spoke to patient regarding her monitor.  I explained that we are receiving active readings during the times she has monitor on.  I asked her to take off the monitor on 05/10/19 and to return it to the office.  Since she is wearing the monitor for extra days, I told her she would not be charged for not returning monitor in 14 days. Original return date is 4/20.  Since she did not wear for 2 days, return date is now 4.22.  She expressed that the battery does not seem to last long and has to keep charging it.  AD

## 2019-05-03 NOTE — Progress Notes (Signed)
Nocturnal oximetry 04/18/2019: SPO2 88% for 6 minutes SPO2 less than 89% 11 minutes. Oxygen desaturation events 03/26/1940.  Desaturation index 42.  Minimum heart rate 54, maximum heart rate 89 bpm, average heart rate 60 bpm.  Percentage in bradycardia 66%.  Highest SPO2 99%, lowest SPO2 84%. Impression: Patient qualifies for nocturnal oxygen supplementation per Medicare guidelines Group 1.

## 2019-05-07 NOTE — Telephone Encounter (Signed)
I left a message for the patient as she had called [place] asking about oxygen supplementation whether she truly needs it.  I have also left a message in detail regarding oximetry, she has prior history of sleep apnea, unable to tolerate CPAP.  Her fatigue, shortness of breath and palpitations may be related to severe nocturnal hypoxemia.

## 2019-05-10 ENCOUNTER — Encounter (INDEPENDENT_AMBULATORY_CARE_PROVIDER_SITE_OTHER): Payer: Self-pay

## 2019-05-10 ENCOUNTER — Other Ambulatory Visit (INDEPENDENT_AMBULATORY_CARE_PROVIDER_SITE_OTHER): Payer: Self-pay

## 2019-05-10 ENCOUNTER — Ambulatory Visit: Payer: Medicare Other | Admitting: Gastroenterology

## 2019-05-10 ENCOUNTER — Telehealth: Payer: Self-pay | Admitting: Emergency Medicine

## 2019-05-10 ENCOUNTER — Encounter: Payer: Self-pay | Admitting: Gastroenterology

## 2019-05-10 VITALS — BP 136/78 | HR 72 | Temp 97.2°F | Ht 63.0 in | Wt 156.0 lb

## 2019-05-10 DIAGNOSIS — K222 Esophageal obstruction: Secondary | ICD-10-CM

## 2019-05-10 DIAGNOSIS — K449 Diaphragmatic hernia without obstruction or gangrene: Secondary | ICD-10-CM

## 2019-05-10 DIAGNOSIS — Z860100 Personal history of colon polyps, unspecified: Secondary | ICD-10-CM

## 2019-05-10 DIAGNOSIS — R1319 Other dysphagia: Secondary | ICD-10-CM

## 2019-05-10 DIAGNOSIS — K2100 Gastro-esophageal reflux disease with esophagitis, without bleeding: Secondary | ICD-10-CM

## 2019-05-10 NOTE — Progress Notes (Signed)
GASTROENTEROLOGY OUTPATIENT CLINIC VISIT   Primary Care Provider [name], [name], NP [address] [place] [address] [phone]  Patient Profile: Darlene [name] is a 86 y.o. female with a pmh significant for CVA (on Plavix), atrial fibrillation, anxiety/depression, sleep apnea, colon polyps (unknown pathology), status post appendectomy, seborrheic keratoses (seen dermatology in the past), chronic sore throat, esophagitis, hiatal hernia, diverticulosis, hemorrhoids, colonic AVM, adenomatous colon polyps.  The patient presents to the [facility] for an evaluation and management of problem(s) noted below:  Problem List 1. Gastroesophageal reflux disease with esophagitis without hemorrhage   2. Other dysphagia   3. Hiatal hernia   4. History of colonic polyps     History of Present Illness Please see initial consultation note for full details of HPI.  Interval History The patient returns for scheduled follow-up.  Since her first consultation meeting, she has undergone an upper and lower endoscopy with results as below.  The patient has been doing okay on current dosing of PPI twice daily.  She wonders if we can change her dosing or decrease that in the future.  Dysphagia symptoms are improved as long as she chews thoroughly.  Continues to have phlegm and chronic sore throat.  No other significant issues are coming from a GI perspective.  GI Review of Systems Positive as above Negative for odynophagia, abdominal pain, change in bowel habits, melena, hematochezia   Review of Systems General: Denies fevers/chills/weight loss Cardiovascular: Denies chest pain/palpitations Pulmonary: Denies shortness of breath Gastroenterological: See HPI Genitourinary: Denies darkened urine or hematuria Hematological: Positive for easy bruising/bleeding due to Plavix chronically Dermatological: Denies jaundice Psychological: Mood is stable   Medications Current Outpatient  Medications  Medication Sig Dispense Refill  . acetaminophen (TYLENOL) 325 MG tablet Take 650 mg by mouth as needed (for sinus headache).    Marland Kitchen azelastine (ASTELIN) 0.1 % nasal spray Place 2 sprays into both nostrils 2 (two) times daily. 30 mL 5  . clopidogrel (PLAVIX) 75 MG tablet TAKE 1 TABLET BY MOUTH EVERY DAY 90 tablet 1  . diazepam (VALIUM) 5 MG tablet Take 2.5 mg by mouth as needed (anxiety).     Marland Kitchen levothyroxine (SYNTHROID) 100 MCG tablet Take 1 tablet (100 mcg total) by mouth daily before breakfast. 90 tablet 1  . losartan (COZAAR) 100 MG tablet TAKE 1/2 TABLET BY MOUTH EVERY DAY 45 tablet 1  . omeprazole (PRILOSEC) 40 MG capsule Take 1 capsule (40 mg total) by mouth in the morning and at bedtime. 60 capsule 1  . pravastatin (PRAVACHOL) 40 MG tablet Take 40 mg by mouth at bedtime.     . sodium chloride (OCEAN) 0.65 % SOLN nasal spray Place 1 spray into both nostrils as needed for congestion.    . triamcinolone cream (KENALOG) 0.1 % Apply 1 application topically at bedtime. Apply to chest    . Zolpidem Tartrate 3.5 MG SUBL Place 3.5 mg under the tongue at bedtime as needed. 30 tablet 0   Current Facility-Administered Medications  Medication Dose Route Frequency Provider Last Rate Last Admin  . 0.9 %  sodium chloride infusion  500 mL Intravenous Once Mansouraty, Telford Nab., MD        Allergies Allergies  Allergen Reactions  . Dust Mite Extract   . Oxycontin [Oxycodone Hcl] Nausea And Vomiting  . Pollen Extract     Histories Past Medical History:  Diagnosis Date  . Anxiety   . Asthma 04/06/2018  . Colon polyps   . Depression   .  Hyperlipidemia   . Hypertension   . Hypothyroidism   . Skin cancer   . Sleep apnea   . Stroke [facility]) 2008   Past Surgical History:  Procedure Laterality Date  . ADENOIDECTOMY    . APPENDECTOMY  1960  . COLONOSCOPY    . GUM SURGERY  03/2019  . [place] SURGERY  2014  . MESH APPLIED TO LAP PORT    . TONSILLECTOMY  1940   Social History     Socioeconomic History  . Marital status: Divorced    Spouse name: Not on file  . Number of children: 3  . Years of education: Not on file  . Highest education level: Not on file  Occupational History  . Occupation: retired Animal nutritionist  Tobacco Use  . Smoking status: Never Smoker  . Smokeless tobacco: Never Used  Substance and Sexual Activity  . Alcohol use: Yes    Alcohol/week: 2.0 standard drinks    Types: 2 Glasses of wine per week  . Drug use: Never  . Sexual activity: Not on file  Other Topics Concern  . Not on file  Social History Narrative   Social History      Diet? ok      Do you drink/eat things with caffeine? Weak coffee, (much milk)      Marital status?          divorced                          What year were you married? 1961      Do you live in a house, apartment, assisted living, condo, trailer, etc.? apartment      Is it one or more stories? 4      How many persons live in your home? Only me      Do you have any pets in your home? (please list) no      Highest level of education completed? 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             yes                         Type & how often? Walk- would love to return to water exercise      Advanced Directives      Do you have a living will? yes      Do you have a DNR form?           no                       If not, do you want to discuss one? no      Do you have signed POA/HPOA for forms? no      Functional Status      Do you have difficulty bathing or dressing yourself?  no      Do you have difficulty preparing food or eating? no      Do you have difficulty managing your medications? no      Do you have difficulty managing your finances?  no      Do you have difficulty affording your medications? no      Social Determinants of Health   Financial Resource Strain:   . Difficulty of Paying Living Expenses:  Food Insecurity:   . Worried About Charity fundraiser in the Last Year:   . Arboriculturist in the Last Year:   Transportation Needs:   . Film/video editor (Medical):   Marland Kitchen Lack of Transportation (Non-Medical):   Physical Activity:   . Days of Exercise per Week:   . Minutes of Exercise per Session:   Stress:   . Feeling of Stress :   Social Connections:   . Frequency of Communication with Friends and Family:   . Frequency of Social Gatherings with Friends and Family:   . Attends Religious Services:   . Active Member of Clubs or Organizations:   . Attends Archivist Meetings:   Marland Kitchen Marital Status:   Intimate Partner Violence:   . Fear of Current or Ex-Partner:   . Emotionally Abused:   Marland Kitchen Physically Abused:   . Sexually Abused:    Family History  Problem Relation Age of Onset  . Lung cancer Mother 68       smoker  . Heart disease Mother   . Stroke Father 17  . Allergic rhinitis Sister   . CVA Brother   . Lung cancer Maternal Grandfather   . Heart attack Paternal Grandfather   . Psoriasis Daughter   . Rheum arthritis Daughter   . Bipolar disorder Daughter    I have reviewed her medical, social, and family history in detail and updated the electronic medical record as necessary.    PHYSICAL EXAMINATION  BP 136/78   Pulse 72   Temp (!) 97.2 F (36.2 C)   Ht 5\' 3"  (1.6 m)   Wt 156 lb (70.8 kg)   BMI 27.63 kg/m  Wt Readings from Last 3 Encounters:  05/10/19 156 lb (70.8 kg)  04/12/19 154 lb (69.9 kg)  04/07/19 176 lb 5.9 oz (80 kg)  GEN: NAD, appears stated age, doesn't appear chronically ill PSYCH: Cooperative, without pressured speech EYE: Conjunctivae pink, sclerae anicteric ENT: MMM CV: Non-tachycardic RESP: No audible wheezing noted GI: NABS, soft, NT/ND, without rebound or guarding MSK/EXT: No lower extremity edema SKIN: No jaundice NEURO:  Alert & Oriented x 3, no focal deficits   REVIEW OF DATA  I reviewed the following data at the  time of this encounter:  GI Procedures and Studies  October 2020 EGD - No gross lesions in esophagus proximally. Biopsied. - LA Grade C esophagitis at GE Jxn. - 4 cm hiatal hernia. - Erythematous mucosa in the antrum. Biopsied. - Multiple gastric polyps - likely fundic gland. Biopsied a few. - No gross lesions in the duodenal bulb, in the first portion of the duodenum and in the second portion of the duodenum. Biopsied.  October 2020 colonoscopy - Skin tags were found on perianal exam. - The digital rectal exam findings include External and Internal hemorrhoids. Pertinent negatives include no palpable rectal lesions. - The terminal ileum and ileocecal valve appeared normal. - A single medium-sized angioectasia with typical arborization was found in the cecum. - Five sessile polyps were found in the ascending colon (2) and cecum (3). The polyps were 2 to 13 mm in size. These polyps were removed with a cold snare. Resection and retrieval were complete. - Multiple small and large-mouthed diverticula were found in the recto-sigmoid colon, sigmoid colon, transverse colon and ascending colon. - Normal mucosa was found in the entire colon otherwise. - Non-bleeding non-thrombosed external and internal hemorrhoids were found during retroflexion, during perianal exam and during digital exam. The  hemorrhoids were Grade II (internal hemorrhoids that prolapse but reduce spontaneously).  Pathology Diagnosis 1. Surgical [P], duodenum - PEPTIC DUODENITIS - NO DYSPLASIA OR MALIGNANCY IDENTIFIED 2. Surgical [P], random gastric sites - REACTIVE GASTROPATHY WITH FOCAL CHRONIC INFLAMMATION - NO H. PYLORI OR INTESTINAL METAPLASIA IDENTIFIED - SEE COMMENT 3. Surgical [P], gastric polyps - FUNDIC GLAND POLYP(S) - NO H. PYLORI, INTESTINAL METAPLASIA OR MALIGNANCY IDENTIFIED 4. Surgical [P], esophageal - BENIGN SQUAMOUS MUCOSA - NO INCREASED INTRAEPITHELIAL EOSINOPHILS - SEE COMMENT 5. Surgical [P],  colon, ascending and cecum, polyp (5) - TUBULAR ADENOMA (2 FRAGMENTS) - MULTIPLE FRAGMENTS OF SESSILE SERRATED POLYP(S) - NO HIGH GRADE DYSPLASIA OR MALIGNANCY IDENTIFIED  Laboratory Studies  Reviewed those in epic  Imaging Studies  No relevant studies to review   ASSESSMENT  Ms. Cue is a 86 y.o. female with a pmh significant for CVA (on Plavix), atrial fibrillation, anxiety/depression, sleep apnea, colon polyps (unknown pathology), status post appendectomy, seborrheic keratoses (seen dermatology in the past), chronic sore throat, esophagitis, hiatal hernia, diverticulosis, hemorrhoids, colonic AVM, adenomatous colon polyps.  The patient is seen today for evaluation and management of:  1. Gastroesophageal reflux disease with esophagitis without hemorrhage   2. Other dysphagia   3. Hiatal hernia   4. History of colonic polyps    Patient is hemodynamically and clinically stable.  She has had improvement on twice daily dosing of PPI.  We are going to continue that at this time.  We will perform repeat upper endoscopy to evaluate and ensure healing of esophagitis.  If she is healed her esophagitis, then we will decrease her dosing based on symptoms.  We will also plan an empiric dilation, hopefully as long as her esophagitis is healed.  She had multiple adenomatous colon polyps and would normally be due for a follow-up colonoscopy in 3 years, she will be 86 years of age at that time.  We will see how her health is at that time before pursuing any further surveillance colonoscopies.  If symptoms of dysphagia were to recur after endoscopy with dilation then would need to consider manometry.   The risks and benefits of endoscopic evaluation were discussed with the patient; these include but are not limited to the risk of perforation, infection, bleeding, missed lesions, lack of diagnosis, severe illness requiring hospitalization, as well as anesthesia and sedation related illnesses.  The patient is  agreeable to proceed.  All patient questions were answered, to the best of my ability, and the patient agrees to the aforementioned plan of action with follow-up as indicated.   PLAN  Proceed with scheduling follow-up endoscopy to check on healing and for dilation Continue omeprazole 40 mg twice daily for now Hold on manometry for now PCP referral to be placed on patient's behalf into the [facility] system   Orders Placed This Encounter  Procedures  . Ambulatory referral to Gastroenterology  . Ambulatory referral to Internal Medicine    New Prescriptions   No medications on file   Modified Medications   No medications on file    Planned Follow Up No follow-ups on file.   Total Time in Face-to-Face and in Coordination of Care for patient including independent/personal interpretation/review of prior testing, medical history, examination, medication adjustment, communicating results with the patient directly, and documentation with the EHR is 25 minutes.   Justice Britain, MD [facility]oscopy Office # [phone]

## 2019-05-10 NOTE — Patient Instructions (Addendum)
You have been scheduled for an endoscopy. Please follow written instructions given to you at your visit today. If you use inhalers (even only as needed), please bring them with you on the day of your procedure.  Continue Omeprazole 40 mg twice a day until your procedure.   I value your feedback and thank you for entrusting [place] with your care. If you get a [facility] patient survey, I would appreciate you taking the time to let us know about your experience today. Thank you!   Due to recent changes in healthcare laws, you may see the results of your imaging and laboratory studies on MyChart before your provider has had a chance to review them.  We understand that in some cases there may be results that are confusing or concerning to you. Not all laboratory results come back in the same time frame and the provider may be waiting for multiple results in order to interpret others.  Please give [place] 48 hours in order for your provider to thoroughly review all the results before contacting the office for clarification of your results.    Thank you for trusting me with your gastrointestinal care!    Justice Britain, MD

## 2019-05-10 NOTE — Telephone Encounter (Signed)
   Darlene [name] July 26, 1933 [id number]  Dear Dr Marda Stalker:  We have scheduled the above named patient for a(n) endoscopy procedure. Our records show that (s)he is on anticoagulation therapy.  Please advise as to whether the patient may come off their therapy of Plavix 5 days prior to their procedure which is scheduled for 06-05-19.  Please route your response to Debbe Mounts, CMA or fax response to [phone].  Sincerely,    [facility]

## 2019-05-10 NOTE — Telephone Encounter (Signed)
I am fine for her to hold Plavix for 5 days. JG

## 2019-05-10 NOTE — Progress Notes (Unsigned)
Faxed Rx refill request to [facility]. Azelastine 0.1%, 137 mcg, 12ml, 2 sprays each nostril BID. W/7 refills.

## 2019-05-11 ENCOUNTER — Telehealth: Payer: Self-pay

## 2019-05-11 ENCOUNTER — Telehealth: Payer: Self-pay | Admitting: Cardiology

## 2019-05-11 NOTE — Telephone Encounter (Signed)
Left message on patients voicemail to call office.   

## 2019-05-11 NOTE — Telephone Encounter (Signed)
Pt informed to hold Plavix 5 days before procedure and she verbalized understanding and knows to resume Plavix after her procedure as directed at discharge.

## 2019-05-11 NOTE — Telephone Encounter (Signed)
Pt called to inform us that she is returning O2 back to [place] today as she does not want to continue to use.  She states that last night she wore and woke up with dry mucus membranes and could not breath.  Took her 2 hours to get her membranes moist. Also stated she could not sleep on her sides. She does not want to use the O2 anymore.

## 2019-05-11 NOTE — Telephone Encounter (Signed)
I left a voicemail explaining that she should probably use a humidifier in the room, last night was extremely cold and she probably had a heater on in the house.  We could also supply humidifier with the oxygen supply.  Explained that she can also sleep on the side wearing nasal cannula oxygen.  Encouraged her to use it for a few days to see if her symptoms of fatigue would improve.

## 2019-05-12 ENCOUNTER — Encounter: Payer: Self-pay | Admitting: Gastroenterology

## 2019-05-12 DIAGNOSIS — K449 Diaphragmatic hernia without obstruction or gangrene: Secondary | ICD-10-CM | POA: Insufficient documentation

## 2019-05-12 DIAGNOSIS — K222 Esophageal obstruction: Secondary | ICD-10-CM | POA: Insufficient documentation

## 2019-05-16 ENCOUNTER — Telehealth: Payer: Self-pay | Admitting: *Deleted

## 2019-05-16 ENCOUNTER — Telehealth: Payer: Self-pay | Admitting: Nurse Practitioner

## 2019-05-16 ENCOUNTER — Telehealth: Payer: Self-pay | Admitting: Cardiology

## 2019-05-16 NOTE — Telephone Encounter (Signed)
She is having a lot of problems with HR control and also BP control issues, prior h/o stroke. Will enroll the patient in Remote Patient Monitoring and Principal Care Management as patient is high risk for hospitalization and complications from underlying medical conditions. JG

## 2019-05-16 NOTE — Telephone Encounter (Signed)
Patient called wanting to speak with [facility] today regarding her Blood Pressure. Stated that she has eye surgery tomorrow and wants to speak with [facility] before that. Wants you to call her at [phone].

## 2019-05-16 NOTE — Telephone Encounter (Signed)
Called and spoke with the patient

## 2019-05-16 NOTE — Telephone Encounter (Signed)
Please read

## 2019-05-16 NOTE — Telephone Encounter (Signed)
Pt coming in tomorrow to enroll in RPM/PCM for BP management. Will discuss and review pt's concern in more detail tomorrow. Encouraged pt to establish care with an endocrinologist to mange her concerns of thyroid dysfunction or to discuss it further with her PCP.

## 2019-05-16 NOTE — Telephone Encounter (Signed)
Pt called stating having problems with her blood pressure.  See readings:  05/14/19 - night 135/85, pulse 76; 05/15/19 morning 149/88, pulse 83; 05/16/19 3:30am 164/97, pulse 61, also noticed flutters; 8:00am 142/81, pulse 61; 1:00pm 136/82.  Patient is also concerned she may have hypothyroidism.  States was over radiated as a child and has been on synthroid for over 20 years.  Also has terrible allergies.   Does she need to be seen for her blood presser earlier than follow-up visit in May.

## 2019-05-16 NOTE — Telephone Encounter (Signed)
Called and spoke with the patient: the patient will f/u with Dr. Nadyne Coombes, Cardiologist to address blood pressure issue, will call to reschedule her surgery.

## 2019-05-17 ENCOUNTER — Telehealth: Payer: Self-pay

## 2019-05-17 NOTE — Telephone Encounter (Signed)
pt needed to r/s 06/01/2019 appt b/c she has a procedure on the same day. She stated she will be leaving out of town around 06/04/2019 and would no be back until sometime in July. She was uncertain on her actual retain date. Pt added to waitlist for any appts before 06/04/2019. She is aware that if we can not get her scheduled by 06/04/2019 she is to call the office to schedule appt when she returns in July.

## 2019-05-23 ENCOUNTER — Other Ambulatory Visit: Payer: Self-pay | Admitting: Gastroenterology

## 2019-05-23 ENCOUNTER — Other Ambulatory Visit: Payer: Self-pay | Admitting: Pharmacist

## 2019-05-23 DIAGNOSIS — E039 Hypothyroidism, unspecified: Secondary | ICD-10-CM

## 2019-05-23 DIAGNOSIS — R1310 Dysphagia, unspecified: Secondary | ICD-10-CM

## 2019-05-24 NOTE — Progress Notes (Signed)
Patient ID: Darlene [name], female   DOB: 1933-03-12, 86 y.o.   MRN: [id number]   This visit occurred during the SARS-CoV-2 public health emergency.  Safety protocols were in place, including screening questions prior to the visit, additional usage of staff PPE, and extensive cleaning of exam room while observing appropriate contact time as indicated for disinfecting solutions.   HPI  Darlene [name] is a 86 y.o.-year-old female, referred by her cardiologist, Dr. Einar Gip, for management of longstanding hypothyroidism. She moved back to [place] in 08/2017.  Pt. has been dx with hypothyroidism "many years ago", but started on medication in the 1990s >> on Levothyroxine 100 mcg (same dose since 2013-2014).  She describes that up until approximately 1 week ago, she was taking levothyroxine close to breakfast and at the same time with omeprazole.  However, at her visit with Dr. Einar Gip, she was advised by his assistant to separate them more.  She takes the thyroid hormone: - fasting - with water - separated by >30 min from b'fast - no calcium, iron, multivitamins  - + PPIs (omeprazole 40 mg) in a.m. and in the evening >> now taking the morning dose approximately 1 hour after breakfast  I reviewed pt's thyroid tests: Lab Results  Component Value Date   TSH 1.02 12/05/2018   TSH 1.85 12/13/2017   Antithyroid antibodies: No results found for: THGAB No components found for: TPOAB  Pt describes: - + fatigue - + Poor sleep - + Hair loss  But denies: - weight gain - cold intolerance - depression - constipation  Pt denies feeling nodules in neck, hoarseness, dysphagia/odynophagia, SOB with lying down.  She has no FH of thyroid disorders. No FH of thyroid cancer.  + h/o radiation tx to head or neck - for Acne as a teenager. No recent use of iodine supplements.  Pt. also has a history of root canal  - but congestion, dizziness and increased BP since then. She will see Dr. Lucia Gaskins this pm.  She also  has a history of HTN, HL, melanoma, incontinence.  ROS: Constitutional: no weight gain/loss, no fatigue, no subjective hyperthermia/hypothermia Eyes: + blurry vision, no xerophthalmia ENT: no sore throat, + see HPI Cardiovascular: no CP/+ SOB/+ palpitations/no leg swelling Respiratory: + Cough/+ SOB/+ wheezing Gastrointestinal: no N/V/D/C Musculoskeletal: no muscle/joint aches Skin: + All: Rash, itching, easy bruising,  hair loss Neurological: no tremors/numbness/tingling/+ dizziness, + headache Psychiatric: +: Depression/anxiety  Past Medical History:  Diagnosis Date  . Anxiety   . Asthma 04/06/2018  . Colon polyps   . Depression   . Hyperlipidemia   . Hypertension   . Hypothyroidism   . Skin cancer   . Sleep apnea   . Stroke [facility]) 2008   Past Surgical History:  Procedure Laterality Date  . ADENOIDECTOMY    . APPENDECTOMY  1960  . COLONOSCOPY    . GUM SURGERY  03/2019  . [place] SURGERY  2014  . MESH APPLIED TO LAP PORT    . TONSILLECTOMY  1940   Social History   Socioeconomic History  . Marital status: Widowed    Spouse name: Not on file  . Number of children: 3  . Years of education: Not on file  . Highest education level: Not on file  Occupational History  . Occupation: retired Radiographer, therapeutic  Tobacco Use  . Smoking status: Never Smoker  . Smokeless tobacco: Never Used  Substance and Sexual Activity  . Alcohol use: Yes    Comment: hardly  ever  . Drug use: Never  . Sexual activity: Not on file  Other Topics Concern  . Not on file  Social History Narrative   Social History      Diet? ok      Do you drink/eat things with caffeine? Weak coffee, (much milk)      Marital status?          divorced                          What year were you married? 1961      Do you live in a house, apartment, assisted living, condo, trailer, etc.? apartment      Is it one or more stories? 4      How many persons live in your home? Only me      Do  you have any pets in your home? (please list) no      Highest level of education completed? 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             yes                         Type & how often? Walk- would love to return to water exercise      Advanced Directives      Do you have a living will? yes      Do you have a DNR form?           no                       If not, do you want to discuss one? no      Do you have signed POA/HPOA for forms? no      Functional Status      Do you have difficulty bathing or dressing yourself?  no      Do you have difficulty preparing food or eating? no      Do you have difficulty managing your medications? no      Do you have difficulty managing your finances?  no      Do you have difficulty affording your medications? no      Social Determinants of Health   Financial Resource Strain:   . Difficulty of Paying Living Expenses:   Food Insecurity:   . Worried About Charity fundraiser in the Last Year:   . Arboriculturist in the Last Year:   Transportation Needs:   . Film/video editor (Medical):   Marland Kitchen Lack of Transportation (Non-Medical):   Physical Activity:   . Days of Exercise per Week:   . Minutes of Exercise per Session:   Stress:   . Feeling of Stress :   Social Connections:   . Frequency of Communication with Friends and Family:   . Frequency of Social Gatherings with Friends and Family:   . Attends Religious Services:   . Active Member of Clubs or Organizations:   . Attends Archivist Meetings:   Marland Kitchen Marital Status:   Intimate Partner Violence:   . Fear of Current or Ex-Partner:   . Emotionally Abused:   Marland Kitchen Physically Abused:   . Sexually Abused:    Current Outpatient Medications on File Prior to Visit  Medication Sig Dispense Refill  .  acetaminophen (TYLENOL) 325 MG tablet Take 650 mg by mouth as needed (for sinus headache).    . clopidogrel  (PLAVIX) 75 MG tablet TAKE 1 TABLET BY MOUTH EVERY DAY 90 tablet 1  . diazepam (VALIUM) 5 MG tablet Take 2.5 mg by mouth as needed (anxiety).     . fluticasone (FLONASE) 50 MCG/ACT nasal spray Place 2 sprays into both nostrils at bedtime.    Marland Kitchen levothyroxine (SYNTHROID) 100 MCG tablet Take 1 tablet (100 mcg total) by mouth daily before breakfast. 90 tablet 1  . losartan (COZAAR) 100 MG tablet TAKE 1/2 TABLET BY MOUTH EVERY DAY 45 tablet 1  . omeprazole (PRILOSEC) 40 MG capsule TAKE 1 CAPSULE (40 MG TOTAL) BY MOUTH IN THE MORNING AND AT BEDTIME. 60 capsule 1  . pravastatin (PRAVACHOL) 40 MG tablet Take 40 mg by mouth at bedtime.     . sodium chloride (OCEAN) 0.65 % SOLN nasal spray Place 1 spray into both nostrils as needed for congestion.    . triamcinolone cream (KENALOG) 0.1 % Apply 1 application topically at bedtime. Apply to chest (or keratosis)    . Zolpidem Tartrate 3.5 MG SUBL Place 3.5 mg under the tongue at bedtime as needed. 30 tablet 0  . azelastine (ASTELIN) 0.1 % nasal spray Place 2 sprays into both nostrils 2 (two) times daily. (Patient not taking: Reported on 05/17/2019) 30 mL 5   Current Facility-Administered Medications on File Prior to Visit  Medication Dose Route Frequency Provider Last Rate Last Admin  . 0.9 %  sodium chloride infusion  500 mL Intravenous Once Mansouraty, Telford Nab., MD       Allergies  Allergen Reactions  . Norco [Hydrocodone-Acetaminophen] Nausea And Vomiting  . Dust Mite Extract   . Oxycontin [Oxycodone Hcl] Nausea And Vomiting  . Pollen Extract    Family History  Problem Relation Age of Onset  . Lung cancer Mother 43       smoker  . Heart disease Mother   . Stroke Father 96  . Allergic rhinitis Sister   . CVA Brother   . Lung cancer Maternal Grandfather   . Heart attack Paternal Grandfather   . Psoriasis Daughter   . Rheum arthritis Daughter   . Bipolar disorder Daughter     PE: BP 120/70   Pulse 86   Ht 5\' 3"  (1.6 m)   Wt 155 lb (70.3  kg)   SpO2 98%   BMI 27.46 kg/m  Wt Readings from Last 3 Encounters:  05/25/19 155 lb (70.3 kg)  05/10/19 156 lb (70.8 kg)  04/12/19 154 lb (69.9 kg)   Constitutional: normal weight, in NAD Eyes: PERRLA, EOMI, no exophthalmos ENT: moist mucous membranes, no thyromegaly, no cervical lymphadenopathy Cardiovascular: RRR, No MRG Respiratory: CTA B Gastrointestinal: abdomen soft, NT, ND, BS+ Musculoskeletal: no deformities, strength intact in all 4 Skin: moist, warm, no rashes Neurological: no tremor with outstretched hands, DTR normal in all 4  ASSESSMENT: 1. Hypothyroidism  PLAN:  1. Patient with long-standing hypothyroidism, on levothyroxine therapy.  Her most recent TSH levels were reviewed and these were normal. - she appears euthyroid.  - she does not appear to have a goiter, thyroid nodules, or neck compression symptoms - In the past, she was followed by an endocrinologist that was checking her thyroid and pituitary since she did have radiation therapy for acne in the past.  Reportedly, the pituitary tests are normal. - She continues on levothyroxine, and she has been on a stable dose  for 6 to 7 years. - We discussed about correct intake of levothyroxine, fasting, with water, separated by at least 30 minutes from breakfast, and separated by more than 4 hours from calcium, iron, multivitamins, acid reflux medications (PPIs).  She moved breakfast at least 30 minutes after levothyroxine after discussion with Dr. Irven Shelling assistant approximately 1 week ago.  I advised her to continue this practice.  However, she is still taking omeprazole too close to levothyroxine, only 1 hour later.  Up to a week ago, she was taking them together.  I advised her that they need to be separated by at least 4 hours for best results.  She will try to move omeprazole later. - will check thyroid tests in 6 weeks after the above changes: TSH, free T4 - Otherwise, I will see her back in 6 months  Orders Placed  This Encounter  Procedures  . TSH  . T4, free   Philemon Kingdom, MD PhD [facility]

## 2019-05-25 ENCOUNTER — Ambulatory Visit (INDEPENDENT_AMBULATORY_CARE_PROVIDER_SITE_OTHER): Payer: Medicare Other | Admitting: Otolaryngology

## 2019-05-25 ENCOUNTER — Encounter: Payer: Self-pay | Admitting: Internal Medicine

## 2019-05-25 ENCOUNTER — Ambulatory Visit (INDEPENDENT_AMBULATORY_CARE_PROVIDER_SITE_OTHER): Payer: Medicare Other | Admitting: Internal Medicine

## 2019-05-25 ENCOUNTER — Other Ambulatory Visit: Payer: Self-pay

## 2019-05-25 VITALS — BP 120/70 | HR 86 | Ht 63.0 in | Wt 155.0 lb

## 2019-05-25 DIAGNOSIS — H6501 Acute serous otitis media, right ear: Secondary | ICD-10-CM

## 2019-05-25 DIAGNOSIS — E039 Hypothyroidism, unspecified: Secondary | ICD-10-CM | POA: Diagnosis not present

## 2019-05-25 DIAGNOSIS — J310 Chronic rhinitis: Secondary | ICD-10-CM

## 2019-05-25 DIAGNOSIS — H6981 Other specified disorders of Eustachian tube, right ear: Secondary | ICD-10-CM

## 2019-05-25 NOTE — Progress Notes (Signed)
HPI: Darlene [name] is a 86 y.o. female who returns today for evaluation of decreased hearing in her right ear.  When she moves her head she feels fluid in the ear.  She has sinus pressure and some dizziness.  The congestion has been bad over the past couple weeks.  She has been using Flonase spray regularly.  She has had no fever..  Past Medical History:  Diagnosis Date  . Anxiety   . Asthma 04/06/2018  . Colon polyps   . Depression   . Hyperlipidemia   . Hypertension   . Hypothyroidism   . Skin cancer   . Sleep apnea   . Stroke [facility]) 2008   Past Surgical History:  Procedure Laterality Date  . ADENOIDECTOMY    . APPENDECTOMY  1960  . COLONOSCOPY    . GUM SURGERY  03/2019  . [place] SURGERY  2014  . MESH APPLIED TO LAP PORT    . TONSILLECTOMY  1940   Social History   Socioeconomic History  . Marital status: Widowed    Spouse name: Not on file  . Number of children: 3  . Years of education: Not on file  . Highest education level: Not on file  Occupational History  . Occupation: retired Radiographer, therapeutic  Tobacco Use  . Smoking status: Never Smoker  . Smokeless tobacco: Never Used  Substance and Sexual Activity  . Alcohol use: Yes    Comment: hardly ever  . Drug use: Never  . Sexual activity: Not on file  Other Topics Concern  . Not on file  Social History Narrative   Social History      Diet? ok      Do you drink/eat things with caffeine? Weak coffee, (much milk)      Marital status?          divorced                          What year were you married? 1961      Do you live in a house, apartment, assisted living, condo, trailer, etc.? apartment      Is it one or more stories? 4      How many persons live in your home? Only me      Do you have any pets in your home? (please list) no      Highest level of education completed? 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              yes                         Type & how often? Walk- would love to return to water exercise      Advanced Directives      Do you have a living will? yes      Do you have a DNR form?           no                       If not, do you want to discuss one? no      Do you have signed POA/HPOA for forms? no      Functional Status      Do you have difficulty bathing or dressing yourself?  no      Do you have difficulty preparing food or eating? no      Do you have difficulty managing your medications? no      Do you have difficulty managing your finances?  no      Do you have difficulty affording your medications? no      Social Determinants of Health   Financial Resource Strain:   . Difficulty of Paying Living Expenses:   Food Insecurity:   . Worried About Charity fundraiser in the Last Year:   . Arboriculturist in the Last Year:   Transportation Needs:   . Film/video editor (Medical):   Marland Kitchen Lack of Transportation (Non-Medical):   Physical Activity:   . Days of Exercise per Week:   . Minutes of Exercise per Session:   Stress:   . Feeling of Stress :   Social Connections:   . Frequency of Communication with Friends and Family:   . Frequency of Social Gatherings with Friends and Family:   . Attends Religious Services:   . Active Member of Clubs or Organizations:   . Attends Archivist Meetings:   Marland Kitchen Marital Status:    Family History  Problem Relation Age of Onset  . Lung cancer Mother 41       smoker  . Heart disease Mother   . Stroke Father 68  . Allergic rhinitis Sister   . CVA Brother   . Lung cancer Maternal Grandfather   . Heart attack Paternal Grandfather   . Psoriasis Daughter   . Rheum arthritis Daughter   . Bipolar disorder Daughter    Allergies  Allergen Reactions  . Norco [Hydrocodone-Acetaminophen] Nausea And Vomiting  . Dust Mite Extract   . Oxycontin [Oxycodone Hcl] Nausea And Vomiting  . Pollen Extract    Prior to Admission  medications   Medication Sig Start Date End Date Taking? Authorizing Provider  acetaminophen (TYLENOL) 325 MG tablet Take 650 mg by mouth as needed (for sinus headache).   Yes [provider]  azelastine (ASTELIN) 0.1 % nasal spray Place 2 sprays into both nostrils 2 (two) times daily. 01/31/18  Yes Valentina Shaggy, MD  clopidogrel (PLAVIX) 75 MG tablet TAKE 1 TABLET BY MOUTH EVERY DAY 04/30/19  Yes [name], [name], NP  diazepam (VALIUM) 5 MG tablet Take 2.5 mg by mouth as needed (anxiety).    Yes [provider]  fluticasone (FLONASE) 50 MCG/ACT nasal spray Place 2 sprays into both nostrils at bedtime. 05/06/19  Yes [provider]  levothyroxine (SYNTHROID) 100 MCG tablet Take 1 tablet (100 mcg total) by mouth daily before breakfast. 04/30/19  Yes [name], [name], NP  losartan (COZAAR) 100 MG tablet TAKE 1/2 TABLET BY MOUTH EVERY DAY 04/30/19  Yes Virgie Dad, MD  omeprazole (PRILOSEC) 40 MG capsule TAKE 1 CAPSULE (40 MG TOTAL) BY MOUTH IN THE MORNING AND AT BEDTIME. 05/23/19  Yes Mansouraty, Telford Nab., MD  pravastatin (PRAVACHOL) 40 MG tablet Take 40 mg by mouth at bedtime.    Yes [provider]  sodium chloride (OCEAN) 0.65 % SOLN nasal spray Place 1 spray into both nostrils as needed for congestion.   Yes [provider]  triamcinolone cream (KENALOG) 0.1 % Apply 1 application topically at bedtime. Apply to chest (or keratosis) 01/24/19  Yes [provider]  Zolpidem Tartrate 3.5 MG SUBL Place 3.5 mg under the tongue at bedtime as needed. 03/29/19  Yes [name],  [name], NP     Positive ROS: Otherwise negative  All other systems have been reviewed and were otherwise negative with the exception of those mentioned in the HPI and as above.  Physical Exam: Constitutional: Alert, well-appearing, no acute distress Ears: External ears without lesions or tenderness. Ear canals are clear.  She has a right serous otitis media.  Left TM is clear.  I was  able to insufflate some air behind the right TM in the office today. Nasal: External nose without lesions. Septum midline with mild rhinitis..  Mild congestion but middle meatus regions are clear bilaterally with no gross mucopurulent discharge noted. Oral: Lips and gums without lesions. Tongue and palate mucosa without lesions. Posterior oropharynx clear. Neck: No palpable adenopathy or masses Respiratory: Breathing comfortably  Skin: No facial/neck lesions or rash noted.  Procedures  Assessment: Chronic rhinitis Right serous otitis media with conductive hearing loss  Plan: Placed her on prednisone 50 mg x 2 days.  40 mg x 1 day.  30 mg x 1 day.  20 mg x 1 day and then 10 mg. Also placed on Augmentin 875 mg twice daily x10 days. She will follow-up in 11 days for recheck if serous otitis persist we will plan on myringotomy.   Radene Journey, MD

## 2019-05-25 NOTE — Patient Instructions (Addendum)
Please continue: - Levothyroxine 100 mcg daily  Take the thyroid hormone every day, with water, at least 30 minutes before breakfast, separated by at least 4 hours from: - acid reflux medications - calcium - iron - multivitamins  Please move Omeprazole to lunchtime.  Please come back for labs in June.  Please come back for a follow-up appointment in 6 months.

## 2019-05-28 ENCOUNTER — Other Ambulatory Visit: Payer: Self-pay | Admitting: Pharmacist

## 2019-05-28 ENCOUNTER — Telehealth: Payer: Self-pay | Admitting: Gastroenterology

## 2019-05-28 NOTE — Telephone Encounter (Signed)
Hey Dr. Rush Landmark- this patient had a endoscopy scheduled for 5/14- Friday. She cancelled stating that she has an interior ear inflammation and on medication until this weekend. She states she wants to call back to r/s for the summer.

## 2019-05-28 NOTE — Telephone Encounter (Signed)
Thank you for update. OK for hold until this summer per her request. GM

## 2019-05-31 ENCOUNTER — Other Ambulatory Visit: Payer: Self-pay | Admitting: Nurse Practitioner

## 2019-05-31 ENCOUNTER — Telehealth: Payer: Self-pay

## 2019-05-31 DIAGNOSIS — K219 Gastro-esophageal reflux disease without esophagitis: Secondary | ICD-10-CM

## 2019-05-31 MED ORDER — PANTOPRAZOLE SODIUM 40 MG PO TBEC: 40.0000 mg | DELAYED_RELEASE_TABLET | Freq: Every day | ORAL | 3 refills | Status: DC

## 2019-05-31 NOTE — Telephone Encounter (Signed)
LMOM to return call to discuss.

## 2019-05-31 NOTE — Telephone Encounter (Signed)
-----   Message from Charlyne Petrin, [place] sent at 05/31/2019  4:04 PM EDT ----- Please let the patient know I am going to send order in for Pantoprazole 40mg  qd po, discontinue Omeprazole since it may reduce Clopidogrel levels leading to a reduction in platelet inhibition and increase risk for thrombosis.  ----- Message ----- From: [name], [name], NP Sent: 05/31/2019   4:03 PM EDT To: Charlyne Petrin, CMA

## 2019-06-01 ENCOUNTER — Encounter: Payer: Medicare Other | Admitting: Gastroenterology

## 2019-06-01 ENCOUNTER — Ambulatory Visit: Payer: Medicare Other | Admitting: Cardiology

## 2019-06-05 ENCOUNTER — Ambulatory Visit (INDEPENDENT_AMBULATORY_CARE_PROVIDER_SITE_OTHER): Payer: Medicare Other | Admitting: Otolaryngology

## 2019-06-05 ENCOUNTER — Other Ambulatory Visit: Payer: Self-pay

## 2019-06-05 VITALS — Temp 97.9°F

## 2019-06-05 DIAGNOSIS — H903 Sensorineural hearing loss, bilateral: Secondary | ICD-10-CM | POA: Diagnosis not present

## 2019-06-05 DIAGNOSIS — H6981 Other specified disorders of Eustachian tube, right ear: Secondary | ICD-10-CM

## 2019-06-05 NOTE — Progress Notes (Signed)
HPI: Darlene [name] is a 86 y.o. female who returns today for evaluation of right serous otitis media.  She has just recently completed the steroids and amoxicillin and the hearing is doing much better.  Is not popping anymore and overall her hearing is doing better.  She inquires about hearing aids that long to her husband and can they be refurbished for her..  Past Medical History:  Diagnosis Date  . Anxiety   . Asthma 04/06/2018  . Colon polyps   . Depression   . Hyperlipidemia   . Hypertension   . Hypothyroidism   . Skin cancer   . Sleep apnea   . Stroke [facility]) 2008   Past Surgical History:  Procedure Laterality Date  . ADENOIDECTOMY    . APPENDECTOMY  1960  . COLONOSCOPY    . GUM SURGERY  03/2019  . [place] SURGERY  2014  . MESH APPLIED TO LAP PORT    . TONSILLECTOMY  1940   Social History   Socioeconomic History  . Marital status: Widowed    Spouse name: Not on file  . Number of children: 3  . Years of education: Not on file  . Highest education level: Not on file  Occupational History  . Occupation: retired Radiographer, therapeutic  Tobacco Use  . Smoking status: Never Smoker  . Smokeless tobacco: Never Used  Substance and Sexual Activity  . Alcohol use: Yes    Comment: hardly ever  . Drug use: Never  . Sexual activity: Not on file  Other Topics Concern  . Not on file  Social History Narrative   Social History      Diet? ok      Do you drink/eat things with caffeine? Weak coffee, (much milk)      Marital status?          divorced                          What year were you married? 1961      Do you live in a house, apartment, assisted living, condo, trailer, etc.? apartment      Is it one or more stories? 4      How many persons live in your home? Only me      Do you have any pets in your home? (please list) no      Highest level of education completed? 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             yes                         Type & how often? Walk- would love to return to water exercise      Advanced Directives      Do you have a living will? yes      Do you have a DNR form?           no                       If not, do you want to discuss one? no      Do you have signed POA/HPOA for forms? no      Functional Status      Do you have difficulty bathing or dressing yourself?  no      Do you have difficulty preparing food or eating? no      Do you have difficulty managing your medications? no      Do you have difficulty managing your finances?  no      Do you have difficulty affording your medications? no      Social Determinants of Health   Financial Resource Strain:   . Difficulty of Paying Living Expenses:   Food Insecurity:   . Worried About Charity fundraiser in the Last Year:   . Arboriculturist in the Last Year:   Transportation Needs:   . Film/video editor (Medical):   Marland Kitchen Lack of Transportation (Non-Medical):   Physical Activity:   . Days of Exercise per Week:   . Minutes of Exercise per Session:   Stress:   . Feeling of Stress :   Social Connections:   . Frequency of Communication with Friends and Family:   . Frequency of Social Gatherings with Friends and Family:   . Attends Religious Services:   . Active Member of Clubs or Organizations:   . Attends Archivist Meetings:   Marland Kitchen Marital Status:    Family History  Problem Relation Age of Onset  . Lung cancer Mother 67       smoker  . Heart disease Mother   . Stroke Father 89  . Allergic rhinitis Sister   . CVA Brother   . Lung cancer Maternal Grandfather   . Heart attack Paternal Grandfather   . Psoriasis Daughter   . Rheum arthritis Daughter   . Bipolar disorder Daughter    Allergies  Allergen Reactions  . Norco [Hydrocodone-Acetaminophen] Nausea And Vomiting  . Dust Mite Extract   . Oxycontin [Oxycodone Hcl] Nausea And Vomiting  .  Pollen Extract    Prior to Admission medications   Medication Sig Start Date End Date Taking? Authorizing Provider  acetaminophen (TYLENOL) 325 MG tablet Take 650 mg by mouth as needed (for sinus headache).   Yes [provider]  amoxicillin-clavulanate (AUGMENTIN) 875-125 MG tablet Take 1 tablet by mouth 2 (two) times daily. 05/25/19  Yes [provider]  azelastine (ASTELIN) 0.1 % nasal spray Place 2 sprays into both nostrils 2 (two) times daily. 01/31/18  Yes Valentina Shaggy, MD  clopidogrel (PLAVIX) 75 MG tablet TAKE 1 TABLET BY MOUTH EVERY DAY 04/30/19  Yes [name], [name], NP  diazepam (VALIUM) 5 MG tablet Take 2.5 mg by mouth as needed (anxiety).    Yes [provider]  fluticasone (FLONASE) 50 MCG/ACT nasal spray Place 2 sprays into both nostrils at bedtime. 05/06/19  Yes [provider]  levothyroxine (SYNTHROID) 100 MCG tablet Take 1 tablet (100 mcg total) by mouth daily before breakfast. 04/30/19  Yes [name], [name], NP  losartan (COZAAR) 100 MG tablet TAKE 1/2 TABLET BY MOUTH EVERY DAY 04/30/19  Yes Virgie Dad, MD  pantoprazole (PROTONIX) 40 MG tablet Take 1 tablet (40 mg total) by mouth daily. 05/31/19  Yes [name], [name], NP  pravastatin (PRAVACHOL) 40 MG tablet Take 40 mg by mouth at bedtime.    Yes [provider]  predniSONE (DELTASONE) 10 MG tablet  05/25/19  Yes [provider]  sodium chloride (OCEAN) 0.65 % SOLN nasal spray Place 1 spray into both nostrils as needed for congestion.   Yes [provider]  triamcinolone cream (KENALOG) 0.1 % Apply 1 application topically at bedtime. Apply to  chest (or keratosis) 01/24/19  Yes [provider]  Zolpidem Tartrate 3.5 MG SUBL Place 3.5 mg under the tongue at bedtime as needed. 03/29/19  Yes [name], [name], NP     Positive ROS: Otherwise negative  All other systems have been reviewed and were otherwise negative with the exception of those mentioned in the HPI and as  above.  Physical Exam: Constitutional: Alert, well-appearing, no acute distress Ears: External ears without lesions or tenderness. Ear canals are clear bilaterally with intact, she has small ear canals bilaterally.  TMs are clear bilaterally with good mobility on pneumatic otoscopy.  On hearing screening with the 512 1024 tuning fork AC was greater than BC bilaterally and hearing was fairly symmetric with a mild hearing loss in both ears that was symmetric with the 1024 tuning fork. Nasal: External nose without lesions. Septum midline with mild rhinitis.  No signs of infection.. Clear nasal passages Oral: Lips and gums without lesions. Tongue and palate mucosa without lesions. Posterior oropharynx clear. Neck: No palpable adenopathy or masses Respiratory: Breathing comfortably  Skin: No facial/neck lesions or rash noted.  Procedures  Assessment: Resolved right serous otitis media. History of chronic rhinitis on Flonase and I recommended continuation with this. Mild bilateral SNHL.  Plan: Recommended follow-up with one of the audiologist concerning hearing test and possible use of hearing aids depending on hearing results.  I suspect the hearing aids can be refurbished for her if needed.   Radene Journey, MD

## 2019-06-05 NOTE — Telephone Encounter (Signed)
Patient is hesitant to follow these instructions being that Dr. Nadyne Coombes is aware she has been on it and it works for her. She we give it a try but wants to be sure this is best for her. To [facility], NP

## 2019-06-07 ENCOUNTER — Telehealth: Payer: Self-pay | Admitting: Cardiology

## 2019-06-07 DIAGNOSIS — R002 Palpitations: Secondary | ICD-10-CM | POA: Insufficient documentation

## 2019-06-07 DIAGNOSIS — I10 Essential (primary) hypertension: Secondary | ICD-10-CM

## 2019-06-07 NOTE — Telephone Encounter (Signed)
Patient called on 06/07/2019 at 1:20 AM, concerned about her blood pressure.  She states that ever since she started wearing the monitor, her blood pressure has been running higher than 150/90 mmHg.  Patient states that she is very anxious and nervous.  She denies any other complaints.  When asked about the duration of the monitor she is wearing for, she states that it is "indefinite". She has felt palpitations while wearing the monitor.  Therefore, we should be able to retrieve some data to correlate with her symptoms.  I have recommended her that she return the monitor and continue follow-up with with Dr. Einar Gip in near future.  Patient asked me if she could take Valium.  I informed her that I do not have expertise in prescribing and managing anxiety with Valium.  If she has used Valium in the past without any side effects, it may be reasonable to use 2 mg with caution.  She should continue follow-up with her PCP regarding management of her anxiety.  Time spent: 7 minute

## 2019-06-07 NOTE — Telephone Encounter (Signed)
Called and discussed with pt. Pt believes that she gets more anxious and nervious whenever she checks her BP readings and believes that it is adding to her overall worrying nature for being able to manage all of her current health needs. Pt has also other family and situational stressors that she is currently overwhelmed with. Would prefer not to aggravate her anxiety further with trying to effectively manage her slightly elevated BP readings and just continue her current therapy as is. Pt planing on returning the BP monitor to the office later today. Pt extremely appreciate of the feedback and support from the remote monitoring service

## 2019-06-07 NOTE — Telephone Encounter (Signed)
She can take monitor off. Will observe for now for BP issues and have to make slow changes.

## 2019-06-07 NOTE — Telephone Encounter (Signed)
LMTCB

## 2019-06-08 ENCOUNTER — Encounter (INDEPENDENT_AMBULATORY_CARE_PROVIDER_SITE_OTHER): Payer: Self-pay

## 2019-06-08 NOTE — Progress Notes (Unsigned)
I faxed Rx request refill on 05/14/2019 to [facility] [place]. Azelastine 0.1% (137 MCG) #30 ml w/6 refills. 2 sprays each nostril BID.

## 2019-06-15 ENCOUNTER — Other Ambulatory Visit: Payer: Self-pay | Admitting: Gastroenterology

## 2019-06-15 DIAGNOSIS — R1310 Dysphagia, unspecified: Secondary | ICD-10-CM

## 2019-06-20 ENCOUNTER — Other Ambulatory Visit: Payer: Self-pay | Admitting: *Deleted

## 2019-06-20 MED ORDER — LOSARTAN POTASSIUM 50 MG PO TABS: 50.0000 mg | ORAL_TABLET | Freq: Every day | ORAL | 0 refills | Status: DC

## 2019-06-20 NOTE — Telephone Encounter (Signed)
Patient called requesting refill. Stated that she wants to go back on the 50mg  once daily because it is hard halfing the 100mg .  Visiting [place] and want Rx sent there because she is out of medication. Stated that they have the 50mg  in stock.

## 2019-06-21 ENCOUNTER — Ambulatory Visit: Payer: Medicare Other | Admitting: Physician Assistant

## 2019-07-03 ENCOUNTER — Ambulatory Visit: Payer: Medicare Other | Admitting: Physician Assistant

## 2019-07-20 ENCOUNTER — Ambulatory Visit (INDEPENDENT_AMBULATORY_CARE_PROVIDER_SITE_OTHER): Payer: Medicare Other | Admitting: Otolaryngology

## 2019-07-20 ENCOUNTER — Other Ambulatory Visit: Payer: Self-pay

## 2019-07-20 ENCOUNTER — Encounter (INDEPENDENT_AMBULATORY_CARE_PROVIDER_SITE_OTHER): Payer: Self-pay | Admitting: Otolaryngology

## 2019-07-20 VITALS — Temp 98.2°F

## 2019-07-20 DIAGNOSIS — H903 Sensorineural hearing loss, bilateral: Secondary | ICD-10-CM

## 2019-07-20 DIAGNOSIS — R42 Dizziness and giddiness: Secondary | ICD-10-CM

## 2019-07-20 NOTE — Progress Notes (Signed)
HPI: Darlene [name] is a 86 y.o. female who returns today for evaluation of complaints of feeling off balance and inner ear problem.  She also feels like her right ear feels full and that she is not hearing well.  She does not really describe vertigo but more just chronic off balance when she gets up and down and walks.  She knows that she has some hearing loss but has not had previous audiogram..  Past Medical History:  Diagnosis Date  . Anxiety   . Asthma 04/06/2018  . Colon polyps   . Depression   . Hyperlipidemia   . Hypertension   . Hypothyroidism   . Skin cancer   . Sleep apnea   . Stroke [facility]) 2008   Past Surgical History:  Procedure Laterality Date  . ADENOIDECTOMY    . APPENDECTOMY  1960  . COLONOSCOPY    . GUM SURGERY  03/2019  . [place] SURGERY  2014  . MESH APPLIED TO LAP PORT    . TONSILLECTOMY  1940   Social History   Socioeconomic History  . Marital status: Widowed    Spouse name: Not on file  . Number of children: 3  . Years of education: Not on file  . Highest education level: Not on file  Occupational History  . Occupation: retired Radiographer, therapeutic  Tobacco Use  . Smoking status: Never Smoker  . Smokeless tobacco: Never Used  Vaping Use  . Vaping Use: Never used  Substance and Sexual Activity  . Alcohol use: Yes    Comment: hardly ever  . Drug use: Never  . Sexual activity: Not on file  Other Topics Concern  . Not on file  Social History Narrative   Social History      Diet? ok      Do you drink/eat things with caffeine? Weak coffee, (much milk)      Marital status?          divorced                          What year were you married? 1961      Do you live in a house, apartment, assisted living, condo, trailer, etc.? apartment      Is it one or more stories? 4      How many persons live in your home? Only me      Do you have any pets in your home? (please list) no      Highest level of education completed? 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             yes                         Type & how often? Walk- would love to return to water exercise      Advanced Directives      Do you have a living will? yes      Do you have a DNR form?           no                       If not, do you want to discuss one? no      Do you have signed POA/HPOA for forms? no  Functional Status      Do you have difficulty bathing or dressing yourself?  no      Do you have difficulty preparing food or eating? no      Do you have difficulty managing your medications? no      Do you have difficulty managing your finances?  no      Do you have difficulty affording your medications? no      Social Determinants of Health   Financial Resource Strain:   . Difficulty of Paying Living Expenses:   Food Insecurity:   . Worried About Charity fundraiser in the Last Year:   . Arboriculturist in the Last Year:   Transportation Needs:   . Film/video editor (Medical):   Marland Kitchen Lack of Transportation (Non-Medical):   Physical Activity:   . Days of Exercise per Week:   . Minutes of Exercise per Session:   Stress:   . Feeling of Stress :   Social Connections:   . Frequency of Communication with Friends and Family:   . Frequency of Social Gatherings with Friends and Family:   . Attends Religious Services:   . Active Member of Clubs or Organizations:   . Attends Archivist Meetings:   Marland Kitchen Marital Status:    Family History  Problem Relation Age of Onset  . Lung cancer Mother 48       smoker  . Heart disease Mother   . Stroke Father 25  . Allergic rhinitis Sister   . CVA Brother   . Lung cancer Maternal Grandfather   . Heart attack Paternal Grandfather   . Psoriasis Daughter   . Rheum arthritis Daughter   . Bipolar disorder Daughter    Allergies  Allergen Reactions  . Norco [Hydrocodone-Acetaminophen] Nausea And Vomiting  . Dust  Mite Extract   . Oxycontin [Oxycodone Hcl] Nausea And Vomiting  . Pollen Extract    Prior to Admission medications   Medication Sig Start Date End Date Taking? Authorizing Provider  acetaminophen (TYLENOL) 325 MG tablet Take 650 mg by mouth as needed (for sinus headache).   Yes [provider]  azelastine (ASTELIN) 0.1 % nasal spray Place 2 sprays into both nostrils 2 (two) times daily. 01/31/18  Yes Valentina Shaggy, MD  clopidogrel (PLAVIX) 75 MG tablet TAKE 1 TABLET BY MOUTH EVERY DAY 04/30/19  Yes [name], [name], NP  diazepam (VALIUM) 5 MG tablet Take 2.5 mg by mouth as needed (anxiety).    Yes [provider]  fluticasone (FLONASE) 50 MCG/ACT nasal spray Place 2 sprays into both nostrils at bedtime. 05/06/19  Yes [provider]  levothyroxine (SYNTHROID) 100 MCG tablet Take 1 tablet (100 mcg total) by mouth daily before breakfast. 04/30/19  Yes [name], [name], NP  losartan (COZAAR) 50 MG tablet Take 1 tablet (50 mg total) by mouth daily. 06/20/19  Yes [name], [name], NP  pantoprazole (PROTONIX) 40 MG tablet Take 1 tablet (40 mg total) by mouth daily. 05/31/19  Yes [name], [name], NP  pravastatin (PRAVACHOL) 40 MG tablet Take 40 mg by mouth at bedtime.    Yes [provider]  sodium chloride (OCEAN) 0.65 % SOLN nasal spray Place 1 spray into both nostrils as needed for congestion.   Yes [provider]  triamcinolone cream (KENALOG) 0.1 % Apply 1 application topically at bedtime. Apply to chest (or keratosis) 01/24/19  Yes [provider]  Zolpidem Tartrate 3.5 MG SUBL Place  3.5 mg under the tongue at bedtime as needed. 03/29/19  Yes [name], [name], NP  amoxicillin-clavulanate (AUGMENTIN) 875-125 MG tablet Take 1 tablet by mouth 2 (two) times daily. Patient not taking: Reported on 07/20/2019 05/25/19   [provider]  predniSONE (DELTASONE) 10 MG tablet  05/25/19   [provider]     Positive ROS: Otherwise negative  All other systems  have been reviewed and were otherwise negative with the exception of those mentioned in the HPI and as above.  Physical Exam: Constitutional: Alert, well-appearing, no acute distress Ears: External ears without lesions or tenderness. Ear canals are clear bilaterally.  TMs are clear bilaterally with good mobility pneumatic otoscopy no middle ear effusion noted.  On hearing screening with the 512 1024 tuning fork she actually hears a little bit better on the right side compared to the left but minimal difference with mild hearing loss in both ears.  AC > BC bilaterally.  Dix-Hallpike testing was negative. Nasal: External nose without lesions. Clear nasal passages Oral: Lips and gums without lesions. Tongue and palate mucosa without lesions. Posterior oropharynx clear. Neck: No palpable adenopathy or masses Respiratory: Breathing comfortably  Skin: No facial/neck lesions or rash noted.  Procedures  Assessment: Probable vestibular weakness. SNHL  Plan: We will plan on scheduling her for audiologic testing and have her follow-up following audiologic test. We will also schedule her for vestibular rehab with [facility].   Radene Journey, MD

## 2019-07-26 ENCOUNTER — Other Ambulatory Visit: Payer: Self-pay

## 2019-07-26 ENCOUNTER — Other Ambulatory Visit (INDEPENDENT_AMBULATORY_CARE_PROVIDER_SITE_OTHER): Payer: Medicare Other

## 2019-07-26 DIAGNOSIS — E039 Hypothyroidism, unspecified: Secondary | ICD-10-CM | POA: Diagnosis not present

## 2019-07-26 LAB — TSH: TSH: 0.74 u[IU]/mL (ref 0.35–4.50)

## 2019-07-26 LAB — T4, FREE: Free T4: 0.83 ng/dL (ref 0.60–1.60)

## 2019-07-27 ENCOUNTER — Non-Acute Institutional Stay: Payer: Medicare Other | Admitting: Internal Medicine

## 2019-07-27 ENCOUNTER — Encounter: Payer: Self-pay | Admitting: Internal Medicine

## 2019-07-27 VITALS — BP 128/80 | HR 88 | Temp 97.7°F | Ht 63.0 in | Wt 156.8 lb

## 2019-07-27 DIAGNOSIS — F5101 Primary insomnia: Secondary | ICD-10-CM

## 2019-07-27 DIAGNOSIS — E039 Hypothyroidism, unspecified: Secondary | ICD-10-CM | POA: Diagnosis not present

## 2019-07-27 DIAGNOSIS — F411 Generalized anxiety disorder: Secondary | ICD-10-CM

## 2019-07-27 DIAGNOSIS — I10 Essential (primary) hypertension: Secondary | ICD-10-CM | POA: Diagnosis not present

## 2019-07-27 DIAGNOSIS — K219 Gastro-esophageal reflux disease without esophagitis: Secondary | ICD-10-CM

## 2019-07-27 DIAGNOSIS — Z8673 Personal history of transient ischemic attack (TIA), and cerebral infarction without residual deficits: Secondary | ICD-10-CM

## 2019-07-27 NOTE — Progress Notes (Signed)
Location:  [place] of Service:  Clinic (12)  Provider:   Code Status:  Goals of Care:  Advanced Directives 04/07/2019  Does Patient Have a Medical Advance Directive? No  Type of Advance Directive -  Does patient want to make changes to medical advance directive? -  Would patient like information on creating a medical advance directive? No - Patient declined     Chief Complaint  Patient presents with  . Medical Management of Chronic Issues    Patient returns to the office for her 3 month follow up.  Marland Kitchen Health Maintenance    Dexa & TDAP    HPI: Patient is a 86 y.o. female seen today for medical management of chronic diseases.   Patient came to follow-up for her number of medical problems.   Chronic rhinitis chronic sinusitis and dizziness Follows with Dr. Lucia Gaskins ENT.  He has plan for vestibular therapy GERD Was supposed to get repeat EGD but is going to wait at this time Symptoms seem controlled on Protonix Depression Sees her psychotherapist from [place]. Does not want to try any antidepressant at this time.  Has failed sertraline in the past History of CVA with Visual Field deficit Continues on Plavix History of palpitations Did not like the monitor as arranged by her cardiologist Dr. Einar Gip. Has been stable recently Insomnia Takes low-dose Valium or Ambien sometimes to help sleep and her anxiety Has history of sleep apnea but does not want to use CPAP at this time. Hypothyroidism Follows with Dr. Renne Crigler Low back pain MRI done in 02/21 showed moderate right L5-S1 stenosis with L5 radiculopathy  Lives by herself in [place].  Has 3 kids who are out of state Daughter in [place] who is POA Still drives and has not had any falls recently.  Does not use cane or walker Past Medical History:  Diagnosis Date  . Anxiety   . Asthma 04/06/2018  . Colon polyps   . Depression   . Hyperlipidemia   . Hypertension   . Hypothyroidism   . Skin  cancer   . Sleep apnea   . Stroke [facility]) 2008    Past Surgical History:  Procedure Laterality Date  . ADENOIDECTOMY    . APPENDECTOMY  1960  . COLONOSCOPY    . GUM SURGERY  03/2019  . [place] SURGERY  2014  . MESH APPLIED TO LAP PORT    . TONSILLECTOMY  1940    Allergies  Allergen Reactions  . Norco [Hydrocodone-Acetaminophen] Nausea And Vomiting  . Dust Mite Extract   . Oxycontin [Oxycodone Hcl] Nausea And Vomiting  . Pollen Extract     Outpatient Encounter Medications as of 07/27/2019  Medication Sig  . acetaminophen (TYLENOL) 325 MG tablet Take 650 mg by mouth as needed (for sinus headache).  Marland Kitchen azelastine (ASTELIN) 0.1 % nasal spray Place 2 sprays into both nostrils 2 (two) times daily.  . clopidogrel (PLAVIX) 75 MG tablet TAKE 1 TABLET BY MOUTH EVERY DAY  . diazepam (VALIUM) 5 MG tablet Take 2.5 mg by mouth as needed (anxiety).   . fluticasone (FLONASE) 50 MCG/ACT nasal spray Place 2 sprays into both nostrils at bedtime.  Marland Kitchen levothyroxine (SYNTHROID) 100 MCG tablet Take 1 tablet (100 mcg total) by mouth daily before breakfast.  . losartan (COZAAR) 50 MG tablet Take 1 tablet (50 mg total) by mouth daily.  . pantoprazole (PROTONIX) 40 MG tablet Take 1 tablet (40 mg total) by mouth daily.  Marland Kitchen  pravastatin (PRAVACHOL) 40 MG tablet Take 40 mg by mouth at bedtime.   . sodium chloride (OCEAN) 0.65 % SOLN nasal spray Place 1 spray into both nostrils as needed for congestion.  . triamcinolone cream (KENALOG) 0.1 % Apply 1 application topically at bedtime. Apply to chest (or keratosis)  . Zolpidem Tartrate 3.5 MG SUBL Place 3.5 mg under the tongue at bedtime as needed.  . [DISCONTINUED] amoxicillin-clavulanate (AUGMENTIN) 875-125 MG tablet Take 1 tablet by mouth 2 (two) times daily.  (Patient not taking: Reported on 07/27/2019)  . [DISCONTINUED] predniSONE (DELTASONE) 10 MG tablet  (Patient not taking: Reported on 07/27/2019)   Facility-Administered Encounter Medications as of 07/27/2019    Medication  . 0.9 %  sodium chloride infusion    Review of Systems:  Review of Systems  Constitutional: Negative.   HENT: Negative.   Respiratory: Negative.   Cardiovascular: Negative.   Gastrointestinal: Negative.   Genitourinary: Negative.   Musculoskeletal: Positive for arthralgias, back pain, gait problem and myalgias.  Skin: Negative.   Neurological: Positive for dizziness.  Psychiatric/Behavioral: Positive for dysphoric mood and sleep disturbance. The patient is nervous/anxious.     Health Maintenance  Topic Date Due  . DEXA SCAN  Never done  . TETANUS/TDAP  11/26/2018  . INFLUENZA VACCINE  08/19/2019  . COLONOSCOPY  10/25/2021  . COVID-19 Vaccine  Completed  . PNA vac Low Risk Adult  Completed    Physical Exam: Vitals:   07/27/19 0953  BP: 128/80  Pulse: 88  Temp: 97.7 F (36.5 C)  SpO2: 98%  Weight: 156 lb 12.8 oz (71.1 kg)  Height: 5\' 3"  (1.6 m)   Body mass index is 27.78 kg/m. Physical Exam  Constitutional: Oriented to person, place, and time. Well-developed and well-nourished.  HENT:  Head: Normocephalic.  Mouth/Throat: Oropharynx is clear and moist.  Eyes: Pupils are equal, round, and reactive to light.  Neck: Neck supple.  Cardiovascular: Normal rate and normal heart sounds.  No murmur heard. Pulmonary/Chest: Effort normal and breath sounds normal. No respiratory distress. No wheezes. She has no rales.  Abdominal: Soft. Bowel sounds are normal. No distension. There is no tenderness. There is no rebound.  Musculoskeletal: No edema.  Lymphadenopathy: none Neurological: Alert and oriented to person, place, and time.  Skin: Skin is warm and dry.  Psychiatric: Normal mood and affect. Behavior is normal. Thought content normal.  Anxious  Labs reviewed: Basic Metabolic Panel: Recent Labs    12/05/18 0700 12/05/18 0700 02/20/19 1729 04/03/19 2126 04/07/19 2048 07/26/19 1041  NA 144   < > 139 140 140  --   K 4.1   < > 4.0 4.6 3.8  --   CL  109   < > 107 108 107  --   CO2 27   < > 24 25 23   --   GLUCOSE 104*   < > 117* 134* 122*  --   BUN 14   < > 13 14 12   --   CREATININE 0.78  --  0.96 0.60 0.80  --   CALCIUM 9.6   < > 9.3 8.9 9.6  --   TSH 1.02  --   --   --   --  0.74   < > = values in this interval not displayed.   Liver Function Tests: Recent Labs    12/05/18 0700 02/20/19 1729 04/03/19 2126  AST 17 17 26   ALT 24 17 19   ALKPHOS  --  72 73  BILITOT 0.4  0.7 1.0  PROT 6.4 6.9 7.0  ALBUMIN  --  3.9 3.7   Recent Labs    04/03/19 2126  LIPASE 30   No results for input(s): AMMONIA in the last 8760 hours. CBC: Recent Labs    02/20/19 1729 04/03/19 2126 04/07/19 2048  WBC 6.4 9.8 9.3  NEUTROABS 4.6 7.9* 5.7  HGB 14.0 13.1 14.0  HCT 43.1 40.7 41.8  MCV 91.7 91.7 88.7  PLT 235 266 329   Lipid Panel: Recent Labs    12/05/18 0700  CHOL 191  HDL 48*  LDLCALC 119*  TRIG 127  CHOLHDL 4.0   No results found for: HGBA1C  Procedures since last visit: No results found.  Assessment/Plan  Essential hypertension Stable on Cozaar GERD On Protonix Allergic Rhinitis with Dizziness Follows with ENT On Flonase and Astelin  Going to work with therapy Acquired hypothyroidism TSH normal Follows with Dr Renne Crigler GAD (generalized anxiety disorder) Takes Valium PRN History of CVA (cerebrovascular accident) On Plavix Primary insomnia On Ambien PRN D/W her about the Side effects Also not to mix Valium and Ambien Hyperlipemia LDL needs to be better controlled Wants Dr Gwendel Hanson to address it  Needs TDAP and DEXA in Next visit Also Wants to change her PCP to other [place] group as our schedule does onto work with her. Has appointment in few weeks  Labs/tests ordered:  * No order type specified * Next appt:  Visit date not found  Total time spent in this patient care encounter was  45_  minutes; greater than 50% of the visit spent counseling patient and staff, reviewing records , Labs and coordinating  care for problems addressed at this encounter.

## 2019-08-02 ENCOUNTER — Ambulatory Visit: Payer: Medicare Other | Admitting: Family Medicine

## 2019-08-04 ENCOUNTER — Other Ambulatory Visit: Payer: Self-pay | Admitting: Nurse Practitioner

## 2019-08-04 DIAGNOSIS — K219 Gastro-esophageal reflux disease without esophagitis: Secondary | ICD-10-CM

## 2019-08-09 ENCOUNTER — Encounter (INDEPENDENT_AMBULATORY_CARE_PROVIDER_SITE_OTHER): Payer: Self-pay | Admitting: Otolaryngology

## 2019-08-09 ENCOUNTER — Encounter: Payer: Self-pay | Admitting: Cardiology

## 2019-08-09 ENCOUNTER — Ambulatory Visit (INDEPENDENT_AMBULATORY_CARE_PROVIDER_SITE_OTHER): Payer: Medicare Other | Admitting: Otolaryngology

## 2019-08-09 ENCOUNTER — Other Ambulatory Visit: Payer: Self-pay

## 2019-08-09 VITALS — Temp 97.3°F

## 2019-08-09 DIAGNOSIS — R42 Dizziness and giddiness: Secondary | ICD-10-CM | POA: Diagnosis not present

## 2019-08-09 NOTE — Progress Notes (Signed)
Darlene [name] is back this is Ganji/sinus return please sign and return HPI: Darlene [name] is a 86 y.o. female who returns today for evaluation of dizziness and right ear problems.  She returns today for audiologic testing as she complains of decreased hearing in the right ear as well as pressure in the right ear.  [facility].  She relates that her mother was diagnosed with Mnire's disease. On review of the audiogram in the office today this demonstrated symmetric hearing in both ears with bilateral mild SNHL.  SRT's were 30 dB on the right and 25 dB on the left.  She had type a tympanograms bilaterally..  Past Medical History:  Diagnosis Date  . Anxiety   . Asthma 04/06/2018  . Colon polyps   . Depression   . Hyperlipidemia   . Hypertension   . Hypothyroidism   . Skin cancer   . Sleep apnea   . Stroke [facility]) 2008   Past Surgical History:  Procedure Laterality Date  . ADENOIDECTOMY    . APPENDECTOMY  1960  . COLONOSCOPY    . GUM SURGERY  03/2019  . [place] SURGERY  2014  . MESH APPLIED TO LAP PORT    . TONSILLECTOMY  1940   Social History   Socioeconomic History  . Marital status: Widowed    Spouse name: Not on file  . Number of children: 3  . Years of education: Not on file  . Highest education level: Not on file  Occupational History  . Occupation: retired Radiographer, therapeutic  Tobacco Use  . Smoking status: Never Smoker  . Smokeless tobacco: Never Used  Vaping Use  . Vaping Use: Never used  Substance and Sexual Activity  . Alcohol use: Yes    Comment: hardly ever  . Drug use: Never  . Sexual activity: Not on file  Other Topics Concern  . Not on file  Social History Narrative   Social History      Diet? ok      Do you drink/eat things with caffeine? Weak coffee, (much milk)      Marital status?          divorced                          What year were you married? 1961      Do you live in  a house, apartment, assisted living, condo, trailer, etc.? apartment      Is it one or more stories? 4      How many persons live in your home? Only me      Do you have any pets in your home? (please list) no      Highest level of education completed? 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             yes                         Type & how often? Walk- would love to return to water exercise      Advanced Directives      Do you have a living will? yes      Do you have a DNR form?           no  If not, do you want to discuss one? no      Do you have signed POA/HPOA for forms? no      Functional Status      Do you have difficulty bathing or dressing yourself?  no      Do you have difficulty preparing food or eating? no      Do you have difficulty managing your medications? no      Do you have difficulty managing your finances?  no      Do you have difficulty affording your medications? no      Social Determinants of Health   Financial Resource Strain:   . Difficulty of Paying Living Expenses:   Food Insecurity:   . Worried About Charity fundraiser in the Last Year:   . Arboriculturist in the Last Year:   Transportation Needs:   . Film/video editor (Medical):   Marland Kitchen Lack of Transportation (Non-Medical):   Physical Activity:   . Days of Exercise per Week:   . Minutes of Exercise per Session:   Stress:   . Feeling of Stress :   Social Connections:   . Frequency of Communication with Friends and Family:   . Frequency of Social Gatherings with Friends and Family:   . Attends Religious Services:   . Active Member of Clubs or Organizations:   . Attends Archivist Meetings:   Marland Kitchen Marital Status:    Family History  Problem Relation Age of Onset  . Lung cancer Mother 12       smoker  . Heart disease Mother   . Stroke Father 25  . Allergic rhinitis Sister   . CVA  Brother   . Lung cancer Maternal Grandfather   . Heart attack Paternal Grandfather   . Psoriasis Daughter   . Rheum arthritis Daughter   . Bipolar disorder Daughter    Allergies  Allergen Reactions  . Norco [Hydrocodone-Acetaminophen] Nausea And Vomiting  . Dust Mite Extract   . Oxycontin [Oxycodone Hcl] Nausea And Vomiting  . Pollen Extract    Prior to Admission medications   Medication Sig Start Date End Date Taking? Authorizing Provider  acetaminophen (TYLENOL) 325 MG tablet Take 650 mg by mouth as needed (for sinus headache).   Yes [provider]  azelastine (ASTELIN) 0.1 % nasal spray Place 2 sprays into both nostrils 2 (two) times daily. 01/31/18  Yes Valentina Shaggy, MD  clopidogrel (PLAVIX) 75 MG tablet TAKE 1 TABLET BY MOUTH EVERY DAY 04/30/19  Yes [name], [name], NP  diazepam (VALIUM) 5 MG tablet Take 2.5 mg by mouth as needed (anxiety).    Yes [provider]  fluticasone (FLONASE) 50 MCG/ACT nasal spray Place 2 sprays into both nostrils at bedtime. 05/06/19  Yes [provider]  levothyroxine (SYNTHROID) 100 MCG tablet Take 1 tablet (100 mcg total) by mouth daily before breakfast. 04/30/19  Yes [name], [name], NP  losartan (COZAAR) 50 MG tablet Take 1 tablet (50 mg total) by mouth daily. 06/20/19  Yes [name], [name], NP  pantoprazole (PROTONIX) 40 MG tablet Take 1 tablet (40 mg total) by mouth daily. 05/31/19  Yes [name], [name], NP  pravastatin (PRAVACHOL) 40 MG tablet Take 40 mg by mouth at bedtime.    Yes [provider]  sodium chloride (OCEAN) 0.65 % SOLN nasal spray Place 1 spray into both nostrils as needed for congestion.   Yes [provider]  triamcinolone cream (  KENALOG) 0.1 % Apply 1 application topically at bedtime. Apply to chest (or keratosis) 01/24/19  Yes [provider]  Zolpidem Tartrate 3.5 MG SUBL PLACE 3.5 MG UNDER THE TONGUE AT BEDTIME AS NEEDED. 08/06/19  Yes [name], [name], NP     Positive ROS: Otherwise  negative  All other systems have been reviewed and were otherwise negative with the exception of those mentioned in the HPI and as above.  Physical Exam: Constitutional: Alert, well-appearing, no acute distress Ears: External ears without lesions or tenderness.  She has small ear canals bilaterally.  Both TMs were clear on microscopic exam. Nasal: External nose without lesions.. Clear nasal passages Oral: Lips and gums without lesions. Tongue and palate mucosa without lesions. Posterior oropharynx clear. Neck: No palpable adenopathy or masses Respiratory: Breathing comfortably  Skin: No facial/neck lesions or rash noted.  Procedures  Assessment: Right ear fullness Chronic dizzy problems.  Conditions  Plan: Recommended use of nasal steroid spray for the congestion or pressure in the ear.  Reassured her of normal ear evaluation and audiologic testing otherwise except for mild bilateral SNHL. Would recommend vestibular rehab for balance problems.   Radene Journey, MD

## 2019-08-14 ENCOUNTER — Encounter (INDEPENDENT_AMBULATORY_CARE_PROVIDER_SITE_OTHER): Payer: Self-pay

## 2019-08-14 ENCOUNTER — Ambulatory Visit: Payer: Medicare Other | Admitting: Cardiology

## 2019-08-15 ENCOUNTER — Encounter: Payer: Self-pay | Admitting: Cardiology

## 2019-08-15 ENCOUNTER — Other Ambulatory Visit: Payer: Self-pay

## 2019-08-15 ENCOUNTER — Ambulatory Visit: Payer: Medicare Other | Admitting: Cardiology

## 2019-08-15 VITALS — BP 123/61 | HR 76 | Ht 63.0 in | Wt 154.4 lb

## 2019-08-15 DIAGNOSIS — R002 Palpitations: Secondary | ICD-10-CM

## 2019-08-15 DIAGNOSIS — E7800 Pure hypercholesterolemia, unspecified: Secondary | ICD-10-CM

## 2019-08-15 DIAGNOSIS — I10 Essential (primary) hypertension: Secondary | ICD-10-CM

## 2019-08-15 DIAGNOSIS — G4734 Idiopathic sleep related nonobstructive alveolar hypoventilation: Secondary | ICD-10-CM

## 2019-08-15 NOTE — Progress Notes (Signed)
Primary Physician/Referring:  [name], [name], NP  Patient ID: Darlene [name], female    DOB: 1933/02/07, 86 y.o.   MRN: [id number]  Chief Complaint  Patient presents with  . Follow-up    discuss Plavix    HPI:    Darlene [name]  is a 86 y.o. Caucasian female with hypertension, hyperlipidemia, h/o sleep apnea not on CPAP, prior history of stroke in 2006 without residual defect, on Plavix chronically and there was no diagnosis of atrial fibrillation.  She has had frequent palpitations, difficulty in controlling hypertension, extreme anxiety, multiple medication intolerances.  However symptoms of anxiety and palpitations have resolved now blood pressure has been well controlled she now presents for routine visit and states that since her husband passed away she has been under extreme stress but is coping up with it now well.  Past Medical History:  Diagnosis Date  . Anxiety   . Asthma 04/06/2018  . Colon polyps   . Depression   . Hyperlipidemia   . Hypertension   . Hypothyroidism   . Skin cancer   . Sleep apnea   . Stroke [facility]) 2008   Past Surgical History:  Procedure Laterality Date  . ADENOIDECTOMY    . APPENDECTOMY  1960  . COLONOSCOPY    . GUM SURGERY  03/2019  . [place] SURGERY  2014  . MELANOMA EXCISION  2020  . MESH APPLIED TO LAP PORT    . TONSILLECTOMY  1940   Family History  Problem Relation Age of Onset  . Lung cancer Mother 73       smoker  . Heart disease Mother   . Stroke Father 66  . Heart attack Father   . Allergic rhinitis Sister   . Hypertension Sister   . CVA Brother   . Lung cancer Maternal Grandfather   . Heart attack Paternal Grandfather   . Psoriasis Daughter   . Rheum arthritis Daughter   . Bipolar disorder Daughter     Social History   Tobacco Use  . Smoking status: Never Smoker  . Smokeless tobacco: Never Used  Substance Use Topics  . Alcohol use: Yes    Comment: hardly ever   ROS  Review of Systems  Cardiovascular: Positive for  palpitations. Negative for chest pain, dyspnea on exertion and leg swelling.  Gastrointestinal: Negative for melena.  Psychiatric/Behavioral: The patient is nervous/anxious.    Objective  Blood pressure (!) 123/61, pulse 76, height 5\' 3"  (1.6 m), weight 154 lb 6.4 oz (70 kg), SpO2 96 %.  Vitals with BMI 08/15/2019 07/27/2019 05/25/2019  Height 5\' 3"  5\' 3"  5\' 3"   Weight 154 lbs 6 oz 156 lbs 13 oz 155 lbs  BMI 27.36 91.47 82.95  Systolic 621 308 657  Diastolic 61 80 70  Pulse 76 88 86     Physical Exam Cardiovascular:     Rate and Rhythm: Normal rate and regular rhythm.     Pulses: Intact distal pulses.     Heart sounds: Normal heart sounds. No murmur heard.  No gallop.      Comments: No leg edema, no JVD. Pulmonary:     Effort: Pulmonary effort is normal.     Breath sounds: Normal breath sounds.  Abdominal:     General: Bowel sounds are normal.     Palpations: Abdomen is soft.    Laboratory examination:   Recent Labs    02/20/19 1729 04/03/19 2126 04/07/19 2048  NA 139 140 140  K 4.0 4.6  3.8  CL 107 108 107  CO2 24 25 23   GLUCOSE 117* 134* 122*  BUN 13 14 12   CREATININE 0.96 0.60 0.80  CALCIUM 9.3 8.9 9.6  GFRNONAA 55* >60 >60  GFRAA >60 >60 >60   CrCl cannot be calculated (Patient's most recent lab result is older than the maximum 21 days allowed.).  CMP Latest Ref Rng & Units 04/07/2019 04/03/2019 02/20/2019  Glucose 70 - 99 mg/dL 122(H) 134(H) 117(H)  BUN 8 - 23 mg/dL 12 14 13   Creatinine 0.44 - 1.00 mg/dL 0.80 0.60 0.96  Sodium 135 - 145 mmol/L 140 140 139  Potassium 3.5 - 5.1 mmol/L 3.8 4.6 4.0  Chloride 98 - 111 mmol/L 107 108 107  CO2 22 - 32 mmol/L 23 25 24   Calcium 8.9 - 10.3 mg/dL 9.6 8.9 9.3  Total Protein 6.5 - 8.1 g/dL - 7.0 6.9  Total Bilirubin 0.3 - 1.2 mg/dL - 1.0 0.7  Alkaline Phos 38 - 126 U/L - 73 72  AST 15 - 41 U/L - 26 17  ALT 0 - 44 U/L - 19 17   CBC Latest Ref Rng & Units 04/07/2019 04/03/2019 02/20/2019  WBC 4.0 - 10.5 K/uL 9.3 9.8 6.4   Hemoglobin 12.0 - 15.0 g/dL 14.0 13.1 14.0  Hematocrit 36 - 46 % 41.8 40.7 43.1  Platelets 150 - 400 K/uL 329 266 235   Lipid Panel     Component Value Date/Time   CHOL 191 12/05/2018 0700   TRIG 127 12/05/2018 0700   HDL 48 (L) 12/05/2018 0700   CHOLHDL 4.0 12/05/2018 0700   LDLCALC 119 (H) 12/05/2018 0700   Recent Labs    12/05/18 0700 07/26/19 1041  TSH 1.02 0.74   Medications and allergies   Allergies  Allergen Reactions  . Norco [Hydrocodone-Acetaminophen] Nausea And Vomiting  . Dust Mite Extract   . Oxycontin [Oxycodone Hcl] Nausea And Vomiting  . Pollen Extract      Current Outpatient Medications  Medication Instructions  . acetaminophen (TYLENOL) 650 mg, Oral, As needed  . azelastine (ASTELIN) 0.1 % nasal spray 2 sprays, Each Nare, 2 times daily  . clopidogrel (PLAVIX) 75 MG tablet TAKE 1 TABLET BY MOUTH EVERY DAY  . diazepam (VALIUM) 2.5 mg, Oral, As needed  . fluticasone (FLONASE) 50 MCG/ACT nasal spray 2 sprays, Each Nare, Nightly  . levothyroxine (SYNTHROID) 100 mcg, Oral, Daily before breakfast  . losartan (COZAAR) 50 mg, Oral, Daily  . pantoprazole (PROTONIX) 40 mg, Oral, Daily  . pravastatin (PRAVACHOL) 40 mg, Oral, Daily at bedtime  . sodium chloride (OCEAN) 0.65 % SOLN nasal spray 1 spray, Each Nare, As needed  . triamcinolone cream (KENALOG) 0.1 % 1 application, Topical, Daily at bedtime, Apply to chest (or keratosis)  . Zolpidem Tartrate 3.5 mg, Sublingual, At bedtime PRN   Radiology:   No results found.  Cardiac Studies:   Stress Echocardiogram 2017-06-21:  Resting EKG demonstrated normal sinus rhythm. Stress EKG is negative for myocardial ischemia, patient exercised for 4:29 minutes, achieved 6.2 Mets. Normal blood pressure response. No evidence of ischemia. Stress echocardiogram: Normal LV systolic function, no stress induced wall motion abnormality. EF 75-80%.  Nocturnal oximetry 04/18/2019: SPO2 88% for 6 minutes SPO2 less than 89% 11  minutes. Oxygen desaturation events 03/26/1940.  Desaturation index 42.  Minimum heart rate 54, maximum heart rate 89 bpm, average heart rate 60 bpm.  Percentage in bradycardia 66%.  Highest SPO2 99%, lowest SPO2 84%. Impression: Patient qualifies for nocturnal oxygen  supplementation per Medicare guidelines Group 1.  Remote outpatient cardiac telemetry 04/24/2019 through 05/08/2019: Predominant rhythm is normal sinus rhythm.  Minimum heart rate 49, average heart rate 72 and maximum heart rate 195 bpm. PVC burden <1%.  There were 0 ventricular couplets and 0 ventricular runs. PAC burden <1%.  There was 1 supraventricular run of 5 beats.  No atrial fibrillation.  No heart block. 12 patient activated events correlated with sinus rhythm.  EKG  EKG 04/12/2019: Normal sinus rhythm with rate of 75 bpm, left atrial enlargement, normal axis.  Nonspecific anterolateral T wave normality, cannot exclude ischemia.  Compared to 12/21/2017, no significant change.  Assessment     ICD-10-CM   1. Palpitations  R00.2   2. Essential hypertension  I10   3. Nocturnal hypoxemia  G47.34   4. Pure hypercholesterolemia  E78.00     No orders of the defined types were placed in this encounter.   There are no discontinued medications.  Recommendations:   Maysun Meditz  is a  86 y.o. Caucasian female with hypertension, hyperlipidemia, h/o sleep apnea not on CPAP, prior history of stroke in 2006 without residual defect, on Plavix chronically and there was no diagnosis of atrial fibrillation.  She has had frequent palpitations, difficulty in controlling hypertension, extreme anxiety, multiple medication intolerances.  However symptoms of anxiety and palpitations have resolved now blood pressure has been well controlled. Since her husband passed away she has been under extreme stress but is coping up with it now well.  She states that she is feeling the best she has in a while, she wants to continue present  medication.  Her lipids are not well controlled and I will send a note to her PCP regarding addition of Zetia 10 mg after dinner or changing from pravastatin to Crestor 20 mg daily.  Otherwise from cardiac standpoint she remains stable, I will see her back on annual basis.  Again reviewed her event monitor recordings, there has been no A. fib, she does qualify for nocturnal oxygen supplementation but she does not want this at this point.   Adrian Prows, MD, [facility] 08/18/2019, 4:29 PM Office: [phone]

## 2019-08-16 ENCOUNTER — Encounter: Payer: Self-pay | Admitting: Physician Assistant

## 2019-08-16 ENCOUNTER — Ambulatory Visit: Payer: Medicare Other | Admitting: Physician Assistant

## 2019-08-16 DIAGNOSIS — Z86007 Personal history of in-situ neoplasm of skin: Secondary | ICD-10-CM

## 2019-08-16 DIAGNOSIS — Z86006 Personal history of melanoma in-situ: Secondary | ICD-10-CM

## 2019-08-16 DIAGNOSIS — Z1283 Encounter for screening for malignant neoplasm of skin: Secondary | ICD-10-CM

## 2019-08-16 DIAGNOSIS — D485 Neoplasm of uncertain behavior of skin: Secondary | ICD-10-CM

## 2019-08-16 DIAGNOSIS — L820 Inflamed seborrheic keratosis: Secondary | ICD-10-CM | POA: Diagnosis not present

## 2019-08-16 NOTE — Patient Instructions (Signed)

## 2019-08-16 NOTE — Progress Notes (Signed)
   Follow up Visit  Subjective  Darlene [name] is a 86 y.o. female who presents for the following: Follow-up (routine skin check--).  Objective  Well appearing patient in no apparent distress; mood and affect are within normal limits.  A full examination was performed including head, eyes, ears, nose, lips, neck, chest, axillae, abdomen, back, buttocks, bilateral upper extremities, bilateral lower extremities, hands, feet, fingers, toes, fingernails, and toenails. All findings within normal limits unless otherwise noted below. No suspicious moles noted on back.   Objective  Left Lower Leg - Anterior: Pink crusted papule     Objective  Right Thigh - Anterior: Pink crusted papule     Objective  Chest - Medial (Center), Left Breast (3), Left Forehead (2), Right Breast (3), right neck (8): Erythematous stuck-on, waxy papule or plaque.   Objective  Right Upper Arm - Posterior: 2020Surgical scar examined no sign of recurrence.   Objective  Right Knee - Anterior: Dyspigmented scar. Dyspigmented scar.   Objective  Left Abdomen (side) - Upper: Total body skin exam   Assessment & Plan  Neoplasm of uncertain behavior of skin (2) Left Lower Leg - Anterior  Skin / nail biopsy Type of biopsy: tangential   Informed consent: discussed and consent obtained   Timeout: patient name, date of birth, surgical site, and procedure verified   Procedure prep:  Patient was prepped and draped in usual sterile fashion (Non sterile) Prep type:  Chlorhexidine Anesthesia: the lesion was anesthetized in a standard fashion   Anesthetic:  1% lidocaine w/ epinephrine 1-100,000 local infiltration Instrument used: flexible razor blade   Outcome: patient tolerated procedure well   Post-procedure details: wound care instructions given    Specimen 1 - Surgical pathology Differential Diagnosis: scc vs bcc Check Margins: No  Right Thigh - Anterior  Skin / nail biopsy Type of biopsy: tangential    Informed consent: discussed and consent obtained   Timeout: patient name, date of birth, surgical site, and procedure verified   Procedure prep:  Patient was prepped and draped in usual sterile fashion (Non sterile) Prep type:  Chlorhexidine Anesthesia: the lesion was anesthetized in a standard fashion   Anesthetic:  1% lidocaine w/ epinephrine 1-100,000 local infiltration Instrument used: flexible razor blade   Outcome: patient tolerated procedure well   Post-procedure details: wound care instructions given    Specimen 2 - Surgical pathology Differential Diagnosis: scc vs bcc Check Margins: No  Inflamed seborrheic keratosis (17) right neck (8); Chest - Medial [facility]); Left Breast (3); Right Breast (3); Left Forehead (2)  Destruction of lesion - Chest - Medial (Center), Left Breast, Right Breast Complexity: simple   Destruction method: cryotherapy   Informed consent: discussed and consent obtained   Timeout:  patient name, date of birth, surgical site, and procedure verified Lesion destroyed using liquid nitrogen: Yes   Outcome: patient tolerated procedure well with no complications    Personal history of melanoma in-situ Right Upper Arm - Posterior  Personal history of in-situ neoplasm of skin Right Knee - Anterior  Skin exam for malignant neoplasm Left Abdomen (side) - Upper

## 2019-08-23 ENCOUNTER — Telehealth: Payer: Self-pay

## 2019-08-23 NOTE — Telephone Encounter (Signed)
-----   Message from Arlyss Gandy, [place] sent at 08/22/2019  9:53 PM EDT ----- Thigh precancer recheck in 2 months

## 2019-08-23 NOTE — Telephone Encounter (Signed)
Left message to call office

## 2019-08-24 ENCOUNTER — Encounter: Payer: Self-pay | Admitting: Family Medicine

## 2019-08-24 ENCOUNTER — Other Ambulatory Visit: Payer: Self-pay

## 2019-08-24 ENCOUNTER — Ambulatory Visit: Payer: Medicare Other | Admitting: Family Medicine

## 2019-08-24 VITALS — BP 134/76 | HR 74 | Temp 97.2°F | Ht 63.0 in | Wt 157.6 lb

## 2019-08-24 DIAGNOSIS — M7989 Other specified soft tissue disorders: Secondary | ICD-10-CM

## 2019-08-24 DIAGNOSIS — C439 Malignant melanoma of skin, unspecified: Secondary | ICD-10-CM | POA: Insufficient documentation

## 2019-08-24 DIAGNOSIS — E039 Hypothyroidism, unspecified: Secondary | ICD-10-CM | POA: Diagnosis not present

## 2019-08-24 DIAGNOSIS — Z8673 Personal history of transient ischemic attack (TIA), and cerebral infarction without residual deficits: Secondary | ICD-10-CM

## 2019-08-24 DIAGNOSIS — I10 Essential (primary) hypertension: Secondary | ICD-10-CM

## 2019-08-24 NOTE — Progress Notes (Signed)
Patient: Darlene [name] MRN: [id number] DOB: July 21, 1933 PCP: Orma Flaming, MD     Subjective:  Chief Complaint  Patient presents with  . Establish Care  . Foot Swelling    Right  . skin lesion  . Hypertension  . Hypothyroidism    HPI: The patient is a 86 y.o. female who presents today to establish care. She has past medical history significant for HTN,  Chronic sinusitis, allergies, GERD, schatzki's ring/dysphagia (followed by GI), hypothyroidism, AK/melonoma,  Hx of CVA, and GAD.   She sees a PT for balance issues. She would like to discuss swollen right foot and other issues. .   1) skin cancer Since she moved she has had several skin cancers removed. One was a melanoma. She has the worst AK. She is seeing [place] [facility]. She wants to know about a new device that scans your body for cancer. It is a Gaffer. Wants to know if [facility] has one. Her dermatologist didn't know what this was. Sees PA.   2) right foot swelling She noticed about 6-8 weeks ago while in [place]. She denies any trauma to the foot or in the leg. No shortness of breath or new cough. She doesn't wear compression hose or elevate legs. She noticed when weather was warmer and wearing sandals. No pain in calf or swelling in calf. Swelling only in the distal part of foot.no pain with waling or other issues.   3) hx of CVA -on plavix/statin.   4) hypothyroidism Saw endocrine recently, but states she will see me for this. Labs wnl recently.   5) hypertension Hypertension: Here for follow up of hypertension.  Currently on losartan 50mg /day.  Takes medication as prescribed and denies any side effects. . Weight has been stable. Denies any chest pain, headaches, shortness of breath, vision changes, swelling in lower extremities. Does have swelling in right foot only.    Review of Systems  Constitutional: Negative for chills, fatigue and fever.  HENT: Negative for dental problem, ear pain, hearing  loss and trouble swallowing.   Eyes: Negative for visual disturbance.  Respiratory: Negative for cough, chest tightness and shortness of breath.   Cardiovascular: Negative for chest pain, palpitations and leg swelling.       Right foot swelling   Gastrointestinal: Negative for abdominal pain, blood in stool, diarrhea, nausea and vomiting.  Endocrine: Negative for cold intolerance, polydipsia, polyphagia and polyuria.  Genitourinary: Negative for dysuria and hematuria.  Musculoskeletal: Negative for arthralgias and joint swelling.  Skin: Negative for rash.  Neurological: Negative for dizziness and headaches.  Psychiatric/Behavioral: Negative for dysphoric mood and sleep disturbance. The patient is not nervous/anxious.     Allergies Patient is allergic to norco [hydrocodone-acetaminophen], dust mite extract, oxycontin [oxycodone hcl], and pollen extract.  Past Medical History Patient  has a past medical history of Anxiety, Asthma (04/06/2018), Colon polyps, Depression, Hyperlipidemia, Hypertension, Hypothyroidism, Skin cancer, Sleep apnea, and Stroke ([place]) (2008).  Surgical History Patient  has a past surgical history that includes Lumbar disc surgery (2014); Appendectomy (1960); Tonsillectomy (1940); Adenoidectomy; Mesh applied to lap port; Colonoscopy; Gum surgery (03/2019); and Melanoma excision (2020).  Family History Pateint's family history includes Allergic rhinitis in her sister; Bipolar disorder in her daughter; CVA in her brother; Heart attack in her father and paternal grandfather; Heart disease in her mother; Hypertension in her sister; Lung cancer in her maternal grandfather; Lung cancer (age of onset: 26) in her mother; Psoriasis in her daughter; Rheum arthritis in her daughter;  Stroke (age of onset: 44) in her father.  Social History Patient  reports that she has never smoked. She has never used smokeless tobacco. She reports current alcohol use. She reports that she does not  use drugs.    Objective: Vitals:   08/24/19 1415  BP: 134/76  Pulse: 74  Temp: (!) 97.2 F (36.2 C)  TempSrc: Temporal  SpO2: 97%  Weight: 157 lb 9.6 oz (71.5 kg)  Height: 5\' 3"  (1.6 m)    Body mass index is 27.92 kg/m.  Physical Exam Vitals reviewed.  Constitutional:      Appearance: Normal appearance. She is well-developed. She is obese.  HENT:     Head: Normocephalic and atraumatic.     Right Ear: Tympanic membrane, ear canal and external ear normal.     Left Ear: Tympanic membrane, ear canal and external ear normal.  Eyes:     Conjunctiva/sclera: Conjunctivae normal.     Pupils: Pupils are equal, round, and reactive to light.  Neck:     Thyroid: No thyromegaly.  Cardiovascular:     Rate and Rhythm: Normal rate and regular rhythm.     Pulses: Normal pulses.     Heart sounds: Normal heart sounds. No murmur heard.   Pulmonary:     Effort: Pulmonary effort is normal.     Breath sounds: Normal breath sounds.  Abdominal:     General: Bowel sounds are normal. There is no distension.     Palpations: Abdomen is soft.     Tenderness: There is no abdominal tenderness.  Musculoskeletal:     Cervical back: Normal range of motion and neck supple.     Right lower leg: Edema present.     Comments: Right distal foot with minimal edema. About 1+ pitting. No edema in ankle. No erythema or tenderness. Pulses intact.   Lymphadenopathy:     Cervical: No cervical adenopathy.  Skin:    General: Skin is warm and dry.     Capillary Refill: Capillary refill takes less than 2 seconds.     Findings: No rash.  Neurological:     General: No focal deficit present.     Mental Status: She is alert and oriented to person, place, and time.     Cranial Nerves: No cranial nerve deficit.     Coordination: Coordination normal.     Deep Tendon Reflexes: Reflexes normal.  Psychiatric:        Mood and Affect: Mood normal.        Behavior: Behavior normal.      Office Visit from 08/24/2019 in  [place]  PHQ-2 Total Score 1          Assessment/plan: 1. Essential hypertension Blood pressure is to goal. Continue current anti-hypertensive medications per hpi. Refills not given and routine lab work will be done today. Recommended routine exercise and healthy diet including DASH diet and mediterranean diet. Encouraged weight loss. F/u in 6 months.   - CBC with Differential/Platelet; Future - Comprehensive metabolic panel; Future - CBC with Differential/Platelet - Comprehensive metabolic panel  2. Acquired hypothyroidism Recent labs reviewed and wnl. No refills needed.   3. History of CVA (cerebrovascular accident) Continue plavix/statin.   4. Leg swelling Foot swelling is very mild and discussed this is likely due to her shoe and being on her feet. Recommended we check basic labs and she start conservative therapy with leg elevation/compression hose and better fitting shoes. If not getting better or she feels like  worsening we can ultrasound to check for reflux, but discussed swelling is minimal and only on a small part of her foot. Will recheck in 3 months.  - Brain natriuretic peptide; Future - Brain natriuretic peptide  -will have her come back as after 45 minutes discussed we still needed to address other chronic issues.   Total time of encounter: 50 minutes total time of encounter, including 45 minutes spent in face-to-face patient care. 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 Remainder of non-face-to-face time involved reviewing chart documents/testing relevant to the patient encounter and documentation in the medical record.   This visit occurred during the SARS-CoV-2 public health emergency.  Safety protocols were in place, including screening questions prior to the visit, additional usage of staff PPE, and extensive cleaning of exam  room while observing appropriate contact time as indicated for disinfecting solutions.     Return in about 3 months (around 11/24/2019) for routine f/u with fasting labs. Orma Flaming, MD [place]   08/24/2019

## 2019-08-24 NOTE — Patient Instructions (Signed)
So nice to meet you!  iim not too worried about your foot swelling. Recommend you wear more supportive shoes, elevate your legs and try compression hose. Checking labs today for this though and if getting worse we will ultrasound legs.   I want you to come back in 1-3 months so we can finish everything!   So nice to meet you! Dr. Rogers Blocker

## 2019-08-25 LAB — CBC WITH DIFFERENTIAL/PLATELET
Absolute Monocytes: 375 cells/uL (ref 200–950)
Basophils Absolute: 47 cells/uL (ref 0–200)
Basophils Relative: 0.7 %
Eosinophils Absolute: 141 cells/uL (ref 15–500)
Eosinophils Relative: 2.1 %
HCT: 44.8 % (ref 35.0–45.0)
Hemoglobin: 14.3 g/dL (ref 11.7–15.5)
Lymphs Abs: 2821 cells/uL (ref 850–3900)
MCH: 28.9 pg (ref 27.0–33.0)
MCHC: 31.9 g/dL — ABNORMAL LOW (ref 32.0–36.0)
MCV: 90.5 fL (ref 80.0–100.0)
MPV: 11.5 fL (ref 7.5–12.5)
Monocytes Relative: 5.6 %
Neutro Abs: 3317 cells/uL (ref 1500–7800)
Neutrophils Relative %: 49.5 %
Platelets: 264 10*3/uL (ref 140–400)
RBC: 4.95 10*6/uL (ref 3.80–5.10)
RDW: 12.6 % (ref 11.0–15.0)
Total Lymphocyte: 42.1 %
WBC: 6.7 10*3/uL (ref 3.8–10.8)

## 2019-08-25 LAB — COMPREHENSIVE METABOLIC PANEL
AG Ratio: 1.8 (calc) (ref 1.0–2.5)
ALT: 16 U/L (ref 6–29)
AST: 16 U/L (ref 10–35)
Albumin: 4.4 g/dL (ref 3.6–5.1)
Alkaline phosphatase (APISO): 77 U/L (ref 37–153)
BUN: 16 mg/dL (ref 7–25)
CO2: 27 mmol/L (ref 20–32)
Calcium: 10 mg/dL (ref 8.6–10.4)
Chloride: 108 mmol/L (ref 98–110)
Creat: 0.8 mg/dL (ref 0.60–0.88)
Globulin: 2.4 g/dL (calc) (ref 1.9–3.7)
Glucose, Bld: 91 mg/dL (ref 65–99)
Potassium: 4.6 mmol/L (ref 3.5–5.3)
Sodium: 144 mmol/L (ref 135–146)
Total Bilirubin: 0.4 mg/dL (ref 0.2–1.2)
Total Protein: 6.8 g/dL (ref 6.1–8.1)

## 2019-08-25 LAB — BRAIN NATRIURETIC PEPTIDE: Brain Natriuretic Peptide: 25 pg/mL (ref <100)

## 2019-08-29 ENCOUNTER — Telehealth: Payer: Self-pay

## 2019-08-29 NOTE — Telephone Encounter (Signed)
Please let her know I sent lab results via mychart, but I guess she doesn't use this. I can not find information on machine and would recommend she ask her dermatologist, the MD not the PA.   Dr. Rogers Blocker

## 2019-08-29 NOTE — Telephone Encounter (Signed)
Pt is calling about a scanning device that dermatologists use that she says Dr. Rogers Blocker was going to find for her. She also wants to check on her lab results.

## 2019-08-29 NOTE — Telephone Encounter (Signed)
Lvm for pt to call the office back. 

## 2019-08-30 NOTE — Telephone Encounter (Signed)
Lvm to give message below.

## 2019-09-04 ENCOUNTER — Telehealth: Payer: Self-pay | Admitting: Physician Assistant

## 2019-09-04 NOTE — Telephone Encounter (Signed)
Patient is calling saying that she wants to get a 3-D CAT scan for skin cancers.  Patient saw on TV that this was available at [facility], but feel that there might be a place closer that does this.

## 2019-09-05 NOTE — Telephone Encounter (Signed)
Calling back about lab result.

## 2019-09-05 NOTE — Telephone Encounter (Signed)
I lvm to make pt aware of message below.

## 2019-09-10 NOTE — Telephone Encounter (Signed)
I'm not sure what she is talking about or who would have it.

## 2019-09-11 ENCOUNTER — Other Ambulatory Visit: Payer: Self-pay | Admitting: Nurse Practitioner

## 2019-09-12 ENCOUNTER — Other Ambulatory Visit: Payer: Self-pay

## 2019-09-12 ENCOUNTER — Ambulatory Visit (INDEPENDENT_AMBULATORY_CARE_PROVIDER_SITE_OTHER): Payer: Medicare Other | Admitting: Otolaryngology

## 2019-09-12 VITALS — Temp 96.1°F

## 2019-09-12 DIAGNOSIS — J310 Chronic rhinitis: Secondary | ICD-10-CM

## 2019-09-12 DIAGNOSIS — J342 Deviated nasal septum: Secondary | ICD-10-CM

## 2019-09-12 NOTE — Progress Notes (Signed)
HPI: Darlene [name] is a 86 y.o. female who returns today for evaluation of chronic nasal obstruction which is worse on the right side.  She states that her right nostril is always closed.  She also gets more pressure in the right ear.  The breathing is much worse at night.  To the point where she frequently wakes up.  She has been using Flonase and azelastine but has been alternating them and not using them on a regular basis.  She has also used saline rinse or Nettie pot.  Patient otherwise feels more congestion on the right side.  Past Medical History:  Diagnosis Date  . Anxiety   . Asthma 04/06/2018  . Colon polyps   . Depression   . Hyperlipidemia   . Hypertension   . Hypothyroidism   . Skin cancer   . Sleep apnea   . Stroke [facility]) 2008   Past Surgical History:  Procedure Laterality Date  . ADENOIDECTOMY    . APPENDECTOMY  1960  . COLONOSCOPY    . GUM SURGERY  03/2019  . [place] SURGERY  2014  . MELANOMA EXCISION  2020  . MESH APPLIED TO LAP PORT    . TONSILLECTOMY  1940   Social History   Socioeconomic History  . Marital status: Widowed    Spouse name: Not on file  . Number of children: 3  . Years of education: Not on file  . Highest education level: Not on file  Occupational History  . Occupation: retired Radiographer, therapeutic  Tobacco Use  . Smoking status: Never Smoker  . Smokeless tobacco: Never Used  Vaping Use  . Vaping Use: Never used  Substance and Sexual Activity  . Alcohol use: Yes    Comment: hardly ever  . Drug use: Never  . Sexual activity: Not on file  Other Topics Concern  . Not on file  Social History Narrative   Social History      Diet? ok      Do you drink/eat things with caffeine? Weak coffee, (much milk)      Marital status?          divorced                          What year were you married? 1961      Do you live in a house, apartment, assisted living, condo, trailer, etc.? apartment      Is it one or more stories? 4       How many persons live in your home? Only me      Do you have any pets in your home? (please list) no      Highest level of education completed? 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             yes                         Type & how often? Walk- would love to return to water exercise      Advanced Directives      Do you have a living will? yes      Do you have a DNR form?           no  If not, do you want to discuss one? no      Do you have signed POA/HPOA for forms? no      Functional Status      Do you have difficulty bathing or dressing yourself?  no      Do you have difficulty preparing food or eating? no      Do you have difficulty managing your medications? no      Do you have difficulty managing your finances?  no      Do you have difficulty affording your medications? no      Social Determinants of Health   Financial Resource Strain:   . Difficulty of Paying Living Expenses: Not on file  Food Insecurity:   . Worried About Charity fundraiser in the Last Year: Not on file  . Ran Out of Food in the Last Year: Not on file  Transportation Needs:   . Lack of Transportation (Medical): Not on file  . Lack of Transportation (Non-Medical): Not on file  Physical Activity:   . Days of Exercise per Week: Not on file  . Minutes of Exercise per Session: Not on file  Stress:   . Feeling of Stress : Not on file  Social Connections:   . Frequency of Communication with Friends and Family: Not on file  . Frequency of Social Gatherings with Friends and Family: Not on file  . Attends Religious Services: Not on file  . Active Member of Clubs or Organizations: Not on file  . Attends Archivist Meetings: Not on file  . Marital Status: Not on file   Family History  Problem Relation Age of Onset  . Lung cancer Mother 79       smoker  . Heart disease Mother   . Stroke  Father 36  . Heart attack Father   . Allergic rhinitis Sister   . Hypertension Sister   . CVA Brother   . Lung cancer Maternal Grandfather   . Heart attack Paternal Grandfather   . Psoriasis Daughter   . Rheum arthritis Daughter   . Bipolar disorder Daughter    Allergies  Allergen Reactions  . Norco [Hydrocodone-Acetaminophen] Nausea And Vomiting  . Dust Mite Extract   . Oxycontin [Oxycodone Hcl] Nausea And Vomiting  . Pollen Extract    Prior to Admission medications   Medication Sig Start Date End Date Taking? Authorizing Provider  acetaminophen (TYLENOL) 325 MG tablet Take 650 mg by mouth as needed (for sinus headache).   Yes [provider]  azelastine (ASTELIN) 0.1 % nasal spray Place 2 sprays into both nostrils 2 (two) times daily. 01/31/18  Yes Valentina Shaggy, MD  clopidogrel (PLAVIX) 75 MG tablet TAKE 1 TABLET BY MOUTH EVERY DAY 04/30/19  Yes [name], [name], NP  diazepam (VALIUM) 5 MG tablet Take 2.5 mg by mouth as needed (anxiety).    Yes [provider]  fluticasone (FLONASE) 50 MCG/ACT nasal spray Place 2 sprays into both nostrils at bedtime. 05/06/19  Yes [provider]  levothyroxine (SYNTHROID) 100 MCG tablet Take 1 tablet (100 mcg total) by mouth daily before breakfast. 04/30/19  Yes [name], [name], NP  losartan (COZAAR) 50 MG tablet TAKE 1 TABLET BY MOUTH EVERY DAY 09/11/19  Yes Virgie Dad, MD  pantoprazole (PROTONIX) 40 MG tablet Take 1 tablet (40 mg total) by mouth daily. 05/31/19  Yes [name], [name], NP  pravastatin (PRAVACHOL) 40 MG tablet Take 40 mg by mouth  at bedtime.    Yes [provider]  sodium chloride (OCEAN) 0.65 % SOLN nasal spray Place 1 spray into both nostrils as needed for congestion.   Yes [provider]  triamcinolone cream (KENALOG) 0.1 % Apply 1 application topically at bedtime. Apply to chest (or keratosis) 01/24/19  Yes [provider]  Zolpidem Tartrate 3.5 MG SUBL PLACE 3.5 MG UNDER THE TONGUE  AT BEDTIME AS NEEDED. 08/06/19  Yes [name], [name], NP     Positive ROS: Otherwise negative  All other systems have been reviewed and were otherwise negative with the exception of those mentioned in the HPI and as above.  Physical Exam: Constitutional: Alert, well-appearing, no acute distress Ears: External ears without lesions or tenderness. Ear canals are clear bilaterally.  TMs are clear bilaterally. Nasal: External nose without lesions. Septum is deviated to the right with moderate rhinitis.  After decongesting the nose with Afrin nasal endoscopy was performed..  The middle meatus regions clear with no polyps no evidence of acute infection.  The nasopharynx was clear.  Eustachian tube opening was clear.  Nasal endoscopy on the left side again revealed a clear nasal passageway.  She does have a septal deviation to the right which contributes to the nasal obstruction as well as a chronic rhinitis but no polyps or signs of acute infection. Oral: Lips and gums without lesions. Tongue and palate mucosa without lesions. Posterior oropharynx clear. Neck: No palpable adenopathy or masses Respiratory: Breathing comfortably  Skin: No facial/neck lesions or rash noted.  Nasal/sinus endoscopy  Date/Time: 09/12/2019 1:31 PM Performed by: Rozetta Nunnery, MD Authorized by: Rozetta Nunnery, MD   Consent:    Consent obtained:  Verbal   Consent given by:  Patient Procedure details:    Indications: sino-nasal symptoms     Medication:  Afrin   Instrument: flexible fiberoptic nasal endoscope     Scope location: bilateral nare   Septum:    Deviation: deviated to the right and posterior deviation     Severity of deviation: intermediate   Sinus:    Right middle meatus: normal     Left middle meatus: normal     Right nasopharynx: normal     Left nasopharynx: normal     Right Eustachian tube orifices: normal     Left Eustachian tube orifices: normal   Comments:     On nasal endoscopy  there is no signs of infection or intranasal lesions causing nasal obstruction.  Findings mostly consistent with septal deviation and chronic rhinitis.    Assessment: Chronic rhinitis with septal deviation to the right causing nasal obstruction. Recommended regular use of Flonase 2 sprays each nostril at night as well as azelastine 1 spray each nostril twice daily.  Recommend use of saline irrigation during the day.  Plan: Could possibly consider surgical intervention if medical therapy is not adequate. Of note she has done better with her dizziness.   Radene Journey, MD

## 2019-09-17 ENCOUNTER — Other Ambulatory Visit: Payer: Self-pay | Admitting: Gastroenterology

## 2019-09-17 DIAGNOSIS — R1310 Dysphagia, unspecified: Secondary | ICD-10-CM

## 2019-09-18 ENCOUNTER — Other Ambulatory Visit: Payer: Self-pay

## 2019-09-18 ENCOUNTER — Ambulatory Visit: Payer: Medicare Other | Admitting: Physician Assistant

## 2019-09-18 ENCOUNTER — Encounter: Payer: Self-pay | Admitting: Physician Assistant

## 2019-09-18 DIAGNOSIS — Z86018 Personal history of other benign neoplasm: Secondary | ICD-10-CM | POA: Diagnosis not present

## 2019-09-18 DIAGNOSIS — L570 Actinic keratosis: Secondary | ICD-10-CM | POA: Diagnosis not present

## 2019-09-18 DIAGNOSIS — D1800 Hemangioma unspecified site: Secondary | ICD-10-CM

## 2019-09-18 DIAGNOSIS — D229 Melanocytic nevi, unspecified: Secondary | ICD-10-CM | POA: Diagnosis not present

## 2019-09-18 DIAGNOSIS — L578 Other skin changes due to chronic exposure to nonionizing radiation: Secondary | ICD-10-CM

## 2019-09-18 DIAGNOSIS — L814 Other melanin hyperpigmentation: Secondary | ICD-10-CM

## 2019-09-18 DIAGNOSIS — L821 Other seborrheic keratosis: Secondary | ICD-10-CM

## 2019-09-18 DIAGNOSIS — Z86006 Personal history of melanoma in-situ: Secondary | ICD-10-CM

## 2019-09-18 NOTE — Progress Notes (Signed)
   Follow-Up Visit   Subjective  Darlene [name] is a 86 y.o. female who presents for the following: Skin Problem (check spots on leg).   The following portions of the chart were reviewed this encounter and updated as appropriate: Tobacco  Allergies  Meds  Problems  Med Hx  Surg Hx  Fam Hx      Objective  Well appearing patient in no apparent distress; mood and affect are within normal limits.  A full examination was performed including scalp, head, eyes, ears, nose, lips, neck, chest, axillae, abdomen, back, buttocks, bilateral upper extremities, bilateral lower extremities, hands, feet, fingers, toes, fingernails, and toenails. All findings within normal limits unless otherwise noted below.  Objective  Left Ankle - Anterior, Left Thigh - Anterior, Left Upper Arm - Anterior, Right Knee - Anterior: Erythematous patches with gritty scale.  Objective  Right Upper Arm - Anterior: All scars clear  Objective  Right Upper Arm - Posterior: Scar clear  Assessment & Plan  AK (actinic keratosis) (4) Right Knee - Anterior; Left Ankle - Anterior; Left Upper Arm - Anterior; Left Thigh - Anterior  Destruction of lesion - Left Ankle - Anterior, Left Thigh - Anterior, Left Upper Arm - Anterior, Right Knee - Anterior Complexity: simple   Destruction method: cryotherapy   Informed consent: discussed and consent obtained   Timeout:  patient name, date of birth, surgical site, and procedure verified Lesion destroyed using liquid nitrogen: Yes   Cryotherapy cycles:  1 Outcome: patient tolerated procedure well with no complications   Post-procedure details: wound care instructions given    History of dysplastic nevus Right Upper Arm - Anterior  Skin exams  Personal history of melanoma in-situ Right Upper Arm - Posterior  observe Lentigines - Scattered tan macules - Discussed due to sun exposure - Benign, observe - Call for any changes  Seborrheic Keratoses - Stuck-on, waxy,  tan-brown papules and plaques  - Discussed benign etiology and prognosis. - Observe - Call for any changes  Melanocytic Nevi - Tan-brown and/or pink-flesh-colored symmetric macules and papules - Benign appearing on exam today - Observation - Call clinic for new or changing moles - Recommend daily use of broad spectrum spf 30+ sunscreen to sun-exposed areas.   Hemangiomas - Red papules - Discussed benign nature - Observe - Call for any changes  Actinic Damage - diffuse scaly erythematous macules with underlying dyspigmentation - Recommend daily broad spectrum sunscreen SPF 30+ to sun-exposed areas, reapply every 2 hours as needed.  - Call for new or changing lesions.   I, Hebah Bogosian, PA-C, have reviewed all documentation's for this visit.  The documentation on 09/18/19 for the exam, diagnosis, procedures and orders are all accurate and complete.

## 2019-09-20 ENCOUNTER — Emergency Department (HOSPITAL_COMMUNITY): Payer: Medicare Other

## 2019-09-20 ENCOUNTER — Emergency Department (HOSPITAL_COMMUNITY)
Admission: EM | Admit: 2019-09-20 | Discharge: 2019-09-20 | Disposition: A | Discharge status: Home / Self Care | Payer: Medicare Other | Attending: Emergency Medicine | Admitting: Emergency Medicine

## 2019-09-20 ENCOUNTER — Other Ambulatory Visit: Payer: Self-pay

## 2019-09-20 DIAGNOSIS — I10 Essential (primary) hypertension: Secondary | ICD-10-CM | POA: Insufficient documentation

## 2019-09-20 DIAGNOSIS — Z7901 Long term (current) use of anticoagulants: Secondary | ICD-10-CM | POA: Insufficient documentation

## 2019-09-20 DIAGNOSIS — Z79899 Other long term (current) drug therapy: Secondary | ICD-10-CM | POA: Insufficient documentation

## 2019-09-20 DIAGNOSIS — Z79890 Hormone replacement therapy (postmenopausal): Secondary | ICD-10-CM | POA: Diagnosis not present

## 2019-09-20 DIAGNOSIS — J45909 Unspecified asthma, uncomplicated: Secondary | ICD-10-CM | POA: Insufficient documentation

## 2019-09-20 DIAGNOSIS — J323 Chronic sphenoidal sinusitis: Secondary | ICD-10-CM | POA: Diagnosis not present

## 2019-09-20 DIAGNOSIS — E039 Hypothyroidism, unspecified: Secondary | ICD-10-CM | POA: Diagnosis not present

## 2019-09-20 DIAGNOSIS — R42 Dizziness and giddiness: Secondary | ICD-10-CM | POA: Diagnosis present

## 2019-09-20 DIAGNOSIS — Z85828 Personal history of other malignant neoplasm of skin: Secondary | ICD-10-CM | POA: Insufficient documentation

## 2019-09-20 LAB — COMPREHENSIVE METABOLIC PANEL
ALT: 21 U/L (ref 0–44)
AST: 19 U/L (ref 15–41)
Albumin: 4.4 g/dL (ref 3.5–5.0)
Alkaline Phosphatase: 80 U/L (ref 38–126)
Anion gap: 10 (ref 5–15)
BUN: 16 mg/dL (ref 8–23)
CO2: 26 mmol/L (ref 22–32)
Calcium: 9.4 mg/dL (ref 8.9–10.3)
Chloride: 106 mmol/L (ref 98–111)
Creatinine, Ser: 0.71 mg/dL (ref 0.44–1.00)
GFR calc Af Amer: >60 mL/min (ref >60)
GFR calc non Af Amer: >60 mL/min (ref >60)
Glucose, Bld: 130 mg/dL — ABNORMAL HIGH (ref 70–99)
Potassium: 3.7 mmol/L (ref 3.5–5.1)
Sodium: 142 mmol/L (ref 135–145)
Total Bilirubin: 0.4 mg/dL (ref 0.3–1.2)
Total Protein: 7.3 g/dL (ref 6.5–8.1)

## 2019-09-20 LAB — DIFFERENTIAL
Abs Immature Granulocytes: 0.05 10*3/uL (ref 0.00–0.07)
Basophils Absolute: 0 10*3/uL (ref 0.0–0.1)
Basophils Relative: 1 %
Eosinophils Absolute: 0.2 10*3/uL (ref 0.0–0.5)
Eosinophils Relative: 2 %
Immature Granulocytes: 1 %
Lymphocytes Relative: 42 %
Lymphs Abs: 3.1 10*3/uL (ref 0.7–4.0)
Monocytes Absolute: 0.4 10*3/uL (ref 0.1–1.0)
Monocytes Relative: 5 %
Neutro Abs: 3.7 10*3/uL (ref 1.7–7.7)
Neutrophils Relative %: 49 %

## 2019-09-20 LAB — I-STAT CHEM 8, ED
BUN: 15 mg/dL (ref 8–23)
Calcium, Ion: 1.19 mmol/L (ref 1.15–1.40)
Chloride: 108 mmol/L (ref 98–111)
Creatinine, Ser: 0.7 mg/dL (ref 0.44–1.00)
Glucose, Bld: 126 mg/dL — ABNORMAL HIGH (ref 70–99)
HCT: 43 % (ref 36.0–46.0)
Hemoglobin: 14.6 g/dL (ref 12.0–15.0)
Potassium: 4.3 mmol/L (ref 3.5–5.1)
Sodium: 143 mmol/L (ref 135–145)
TCO2: 24 mmol/L (ref 22–32)

## 2019-09-20 LAB — CBC
HCT: 43.4 % (ref 36.0–46.0)
Hemoglobin: 14.2 g/dL (ref 12.0–15.0)
MCH: 29.6 pg (ref 26.0–34.0)
MCHC: 32.7 g/dL (ref 30.0–36.0)
MCV: 90.4 fL (ref 80.0–100.0)
Platelets: 245 10*3/uL (ref 150–400)
RBC: 4.8 MIL/uL (ref 3.87–5.11)
RDW: 12.7 % (ref 11.5–15.5)
WBC: 7.4 10*3/uL (ref 4.0–10.5)
nRBC: 0 % (ref 0.0–0.2)

## 2019-09-20 LAB — PROTIME-INR
INR: 0.9 (ref 0.8–1.2)
Prothrombin Time: 11.5 seconds (ref 11.4–15.2)

## 2019-09-20 LAB — I-STAT BETA HCG BLOOD, ED (MC, WL, AP ONLY): I-stat hCG, quantitative: 7.5 m[IU]/mL — ABNORMAL HIGH (ref <5)

## 2019-09-20 LAB — CBG MONITORING, ED: Glucose-Capillary: 105 mg/dL — ABNORMAL HIGH (ref 70–99)

## 2019-09-20 LAB — APTT: aPTT: 24 seconds (ref 24–36)

## 2019-09-20 IMAGING — MR MR HEAD W/O CM
10 series · 43 of 48 positions shown · non-contrast
Comparison: None.

CLINICAL DATA: Dizziness with pulsating sensation

EXAM:
MRI HEAD WITHOUT CONTRAST
TECHNIQUE: Multiplanar, multiecho pulse sequences of the brain and surrounding
structures were obtained without intravenous contrast.

[Series 5: dwi_tracew · axial · 3.0mm · 0.88mm/px · z∈[-69,+81]mm · 8 of 102 slices shown]
[im 1/102]
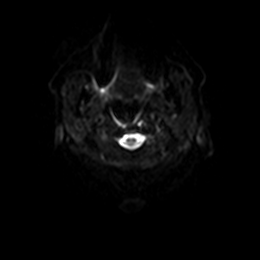
[im 19/102]
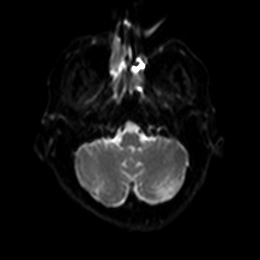
[im 28/102]
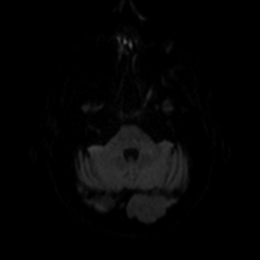
[im 46/102]
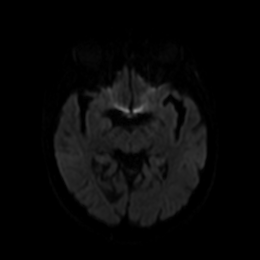
[im 56/102]
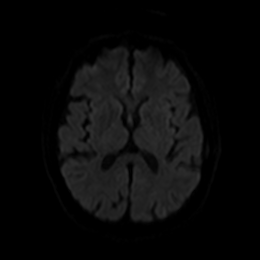
[im 74/102]
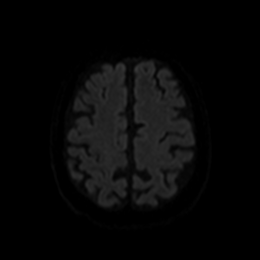
[im 83/102]
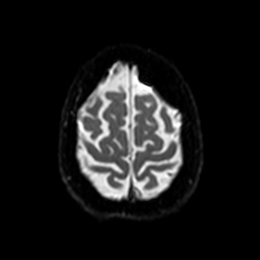
[im 102/102]
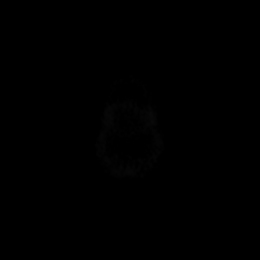

[Series 6: dwi_adc · axial · 3.0mm · 0.88mm/px · z∈[-69,+51]mm · 5 of 51 slices shown]
[im 1/51]
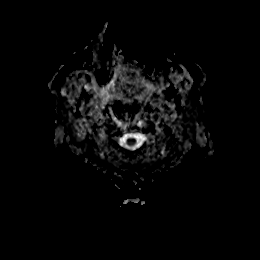
[im 11/51]
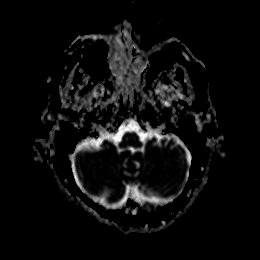
[im 21/51]
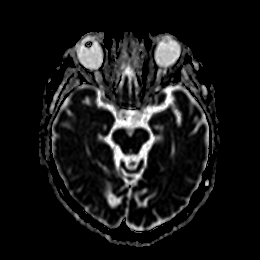
[im 31/51]
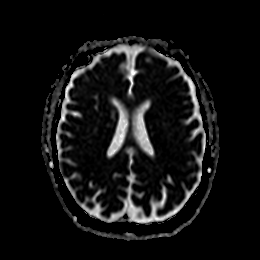
[im 41/51]
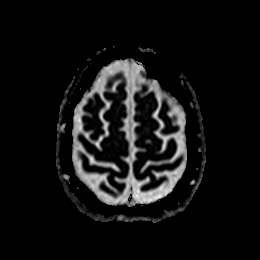

[Series 7: GRE · axial · 4.0mm · 0.41mm/px · z∈[-69,+83]mm · 4 of 39 slices shown]
[im 1/39]
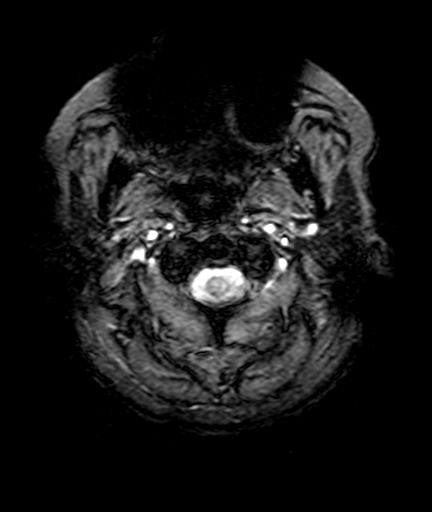
[im 13/39]
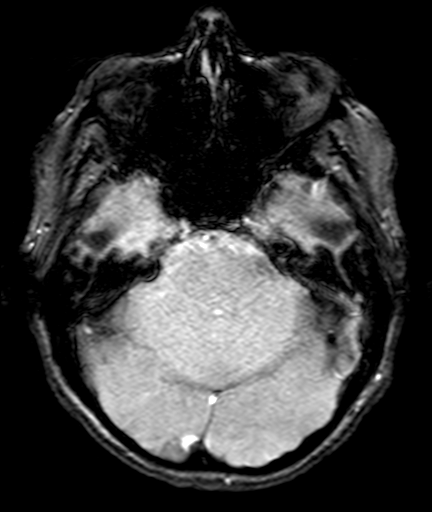
[im 26/39]
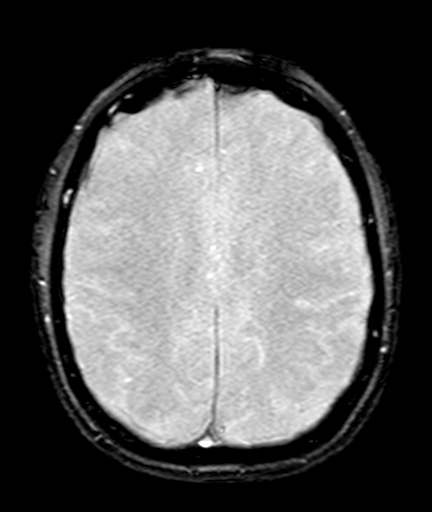
[im 39/39]
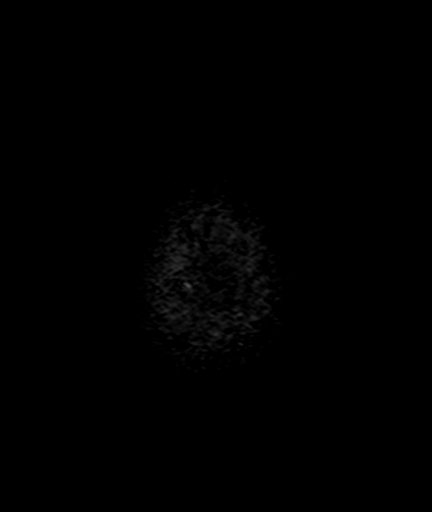

[Series 8: FLAIR · axial · 4.0mm · 0.82mm/px · z∈[-68,+83]mm · 4 of 39 slices shown]
[im 1/39]
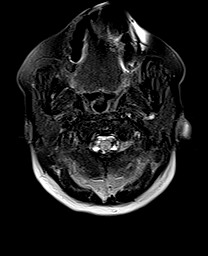
[im 13/39]
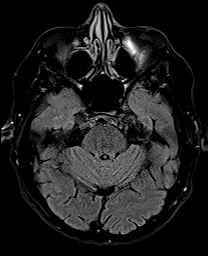
[im 26/39]
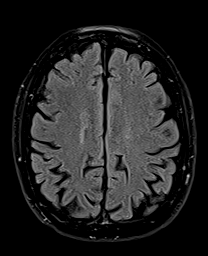
[im 39/39]
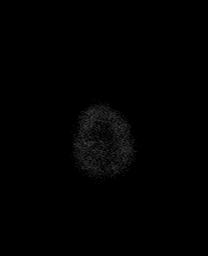

[Series 9: DWI · coronal · 5.0mm · 1.31mm/px · 6 of 56 slices shown (1 of 2)]
[im 1/56]
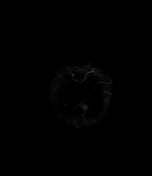
[im 12/56]
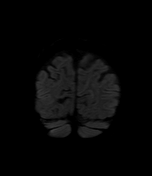
[im 23/56]
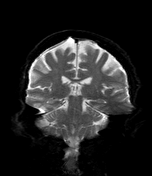
[im 34/56]
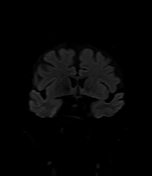
[im 45/56]
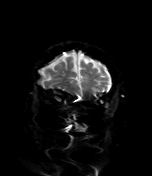
[im 56/56]
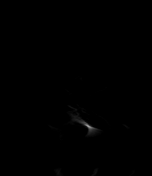

[Series 10: DWI · coronal · 5.0mm · 1.31mm/px · 3 of 28 slices shown (2 of 2)]
[im 1/28]
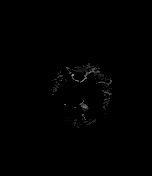
[im 14/28]
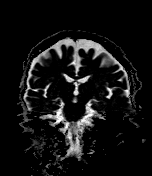
[im 28/28]
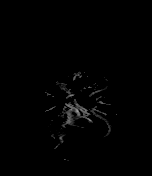

[Series 11: T2 · sagittal · 5.0mm · 0.45mm/px · 3 of 25 slices shown (1 of 3)]
[im 1/25]
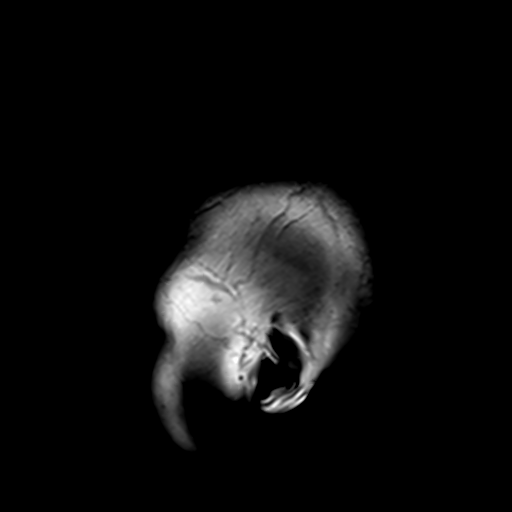
[im 13/25]
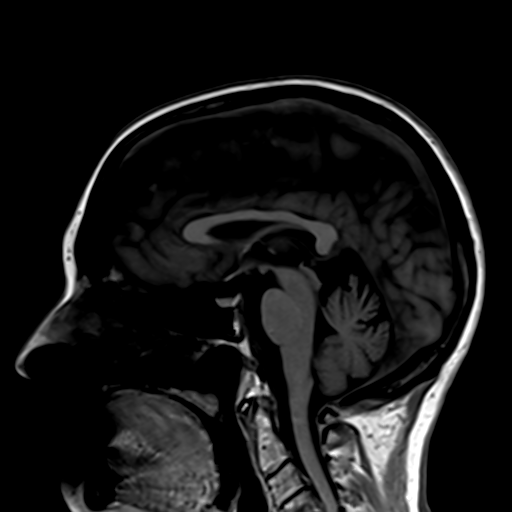
[im 25/25]
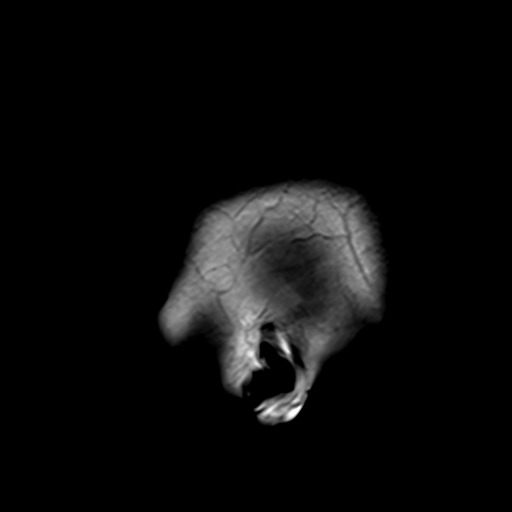

[Series 12: T2 · axial · 5.0mm · 0.41mm/px · z∈[-62,+86]mm · 3 of 24 slices shown (2 of 3)]
[im 1/24]
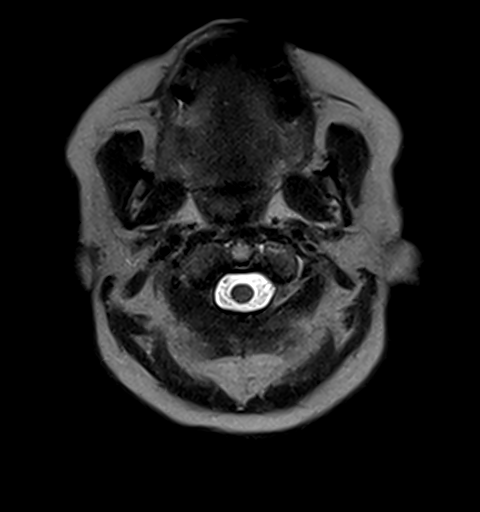
[im 12/24]
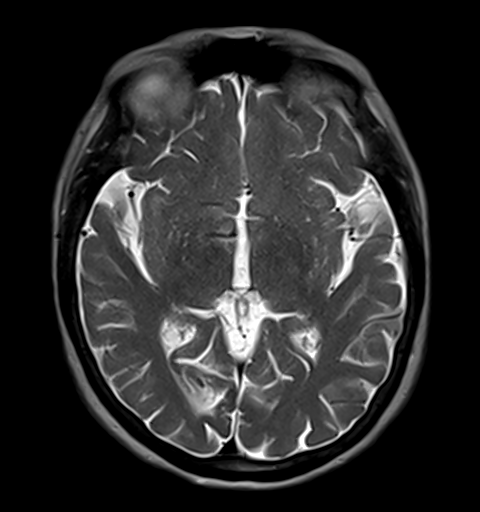
[im 24/24]
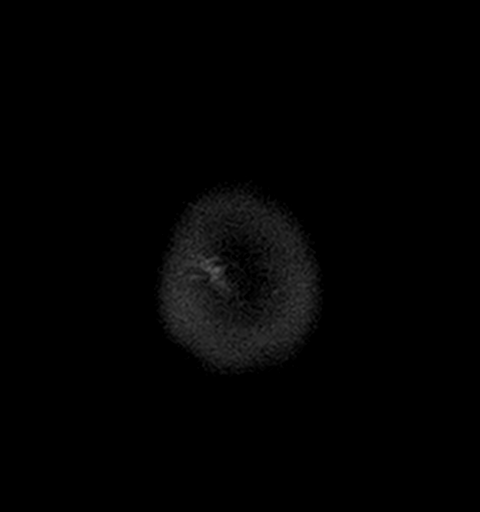

[Series 13: T1 · axial · 4.0mm · 0.41mm/px · z∈[-69,+83]mm · 4 of 39 slices shown]
[im 1/39]
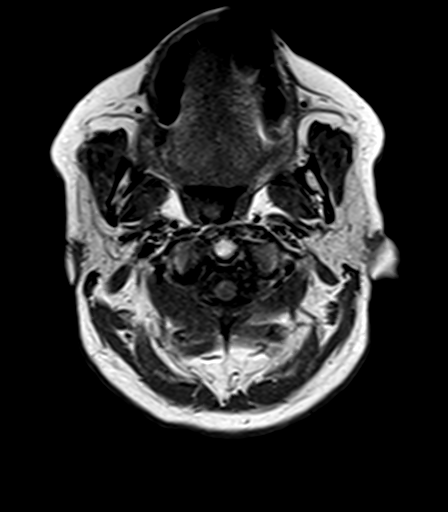
[im 13/39]
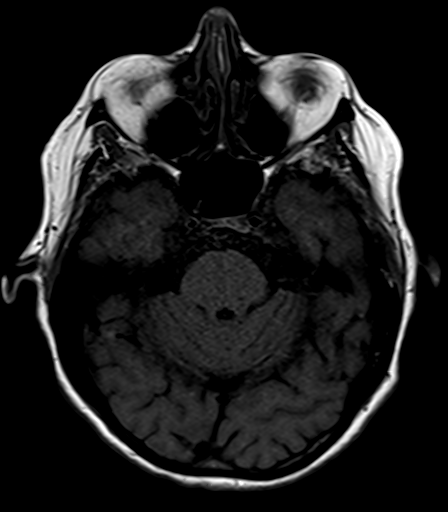
[im 26/39]
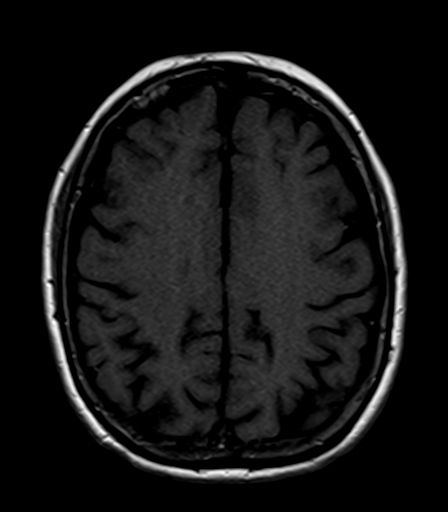
[im 39/39]
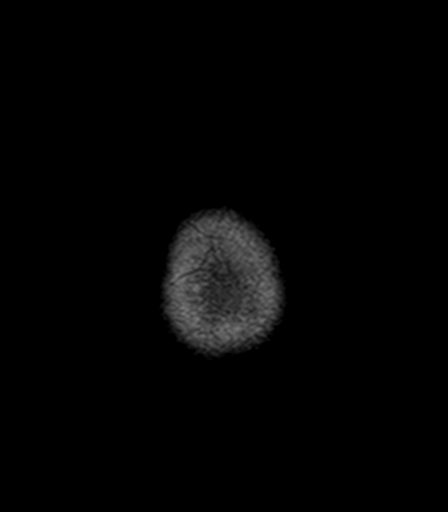

[Series 14: T2 · coronal · 5.0mm · 0.82mm/px · 3 of 28 slices shown (3 of 3)]
[im 1/28]
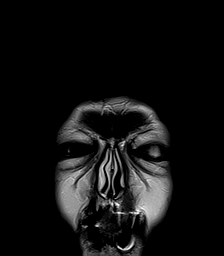
[im 14/28]
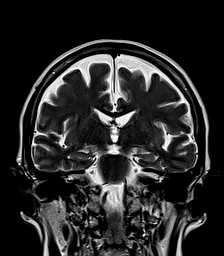
[im 28/28]
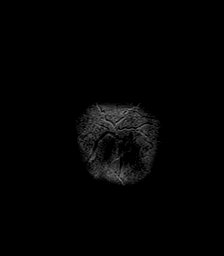

[43 of 48 positions shown; findings below may reference images not displayed]

FINDINGS: Brain: No acute infarct, acute hemorrhage or extra-axial collection.
Normal white matter signal. Normal volume of CSF spaces. No chronic
microhemorrhage. Normal midline structures.

Vascular: Normal flow voids.

Skull and upper cervical spine: Normal marrow signal.

Sinuses/Orbits: Sinuses are clear.  Normal orbits.

Other: None
IMPRESSION: Normal brain MRI.

## 2019-09-20 IMAGING — CT CT HEAD W/O CM
3 series · 16 of 47 positions shown, 19 images · non-contrast
Comparison: None.

CLINICAL DATA: Dizziness

EXAM:
CT HEAD WITHOUT CONTRAST
TECHNIQUE: Contiguous axial images were obtained from the base of the skull
through the vertex without intravenous contrast.

[Series 3: head wo · axial · 0.43mm/px · z∈[-247,-122]mm · 10 of 30 slices shown, 13 images]
[im 3/30  brain]
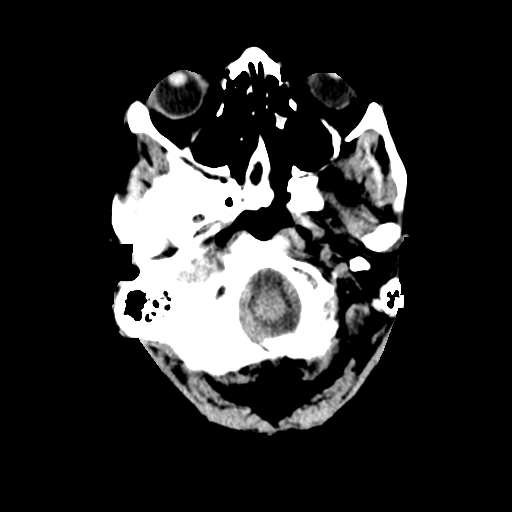
[im 3/30  bone]
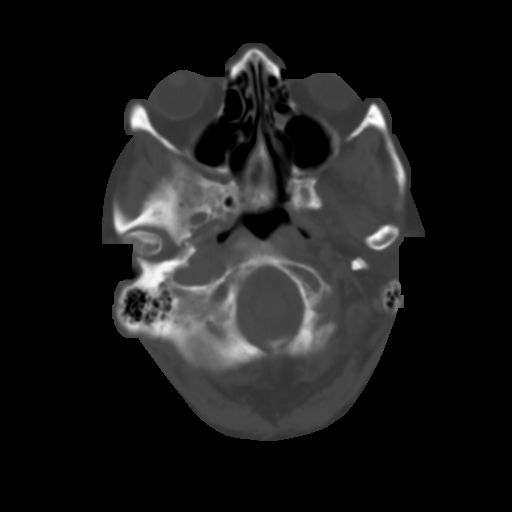
[im 6/30  brain]
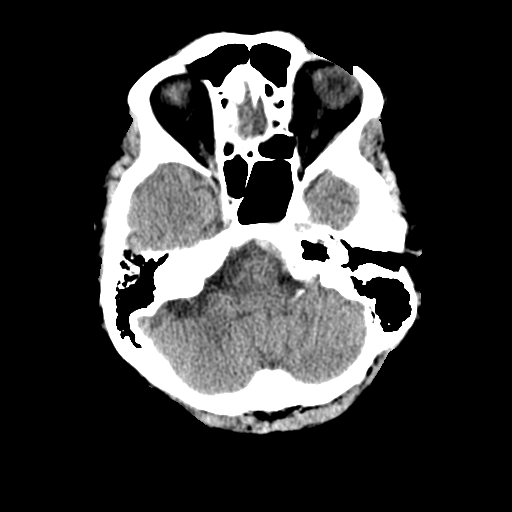
[im 9/30  brain]
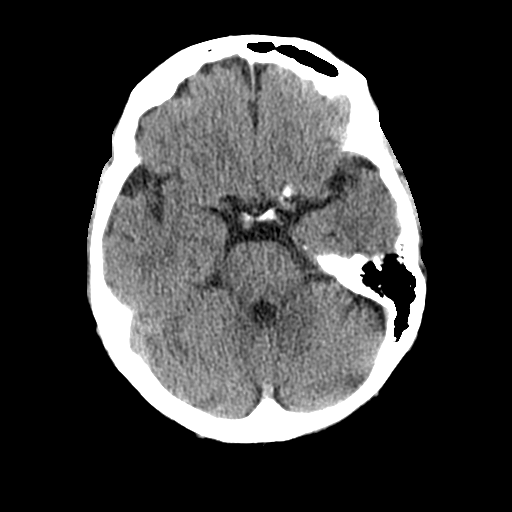
[im 11/30  brain]
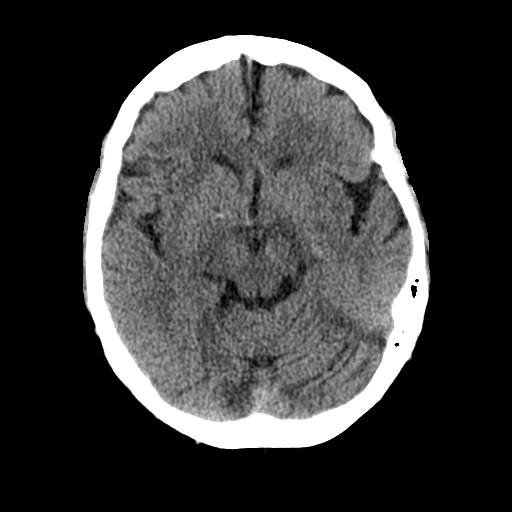
[im 14/30  brain]
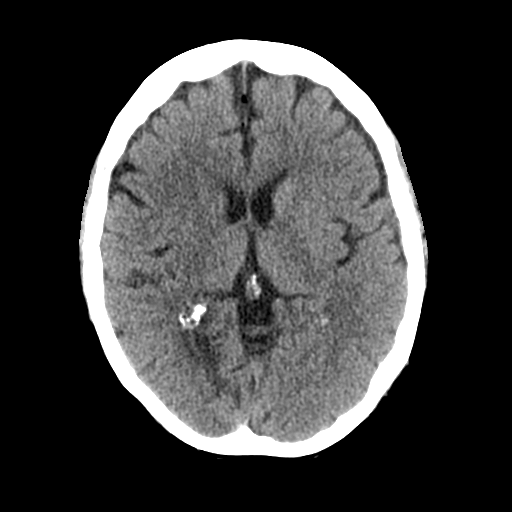
[im 14/30  bone]
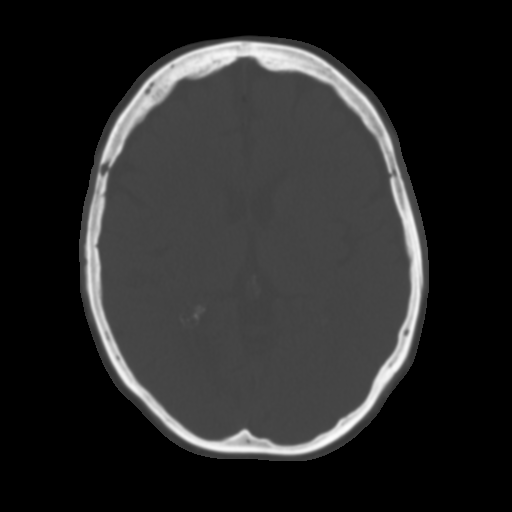
[im 17/30  brain]
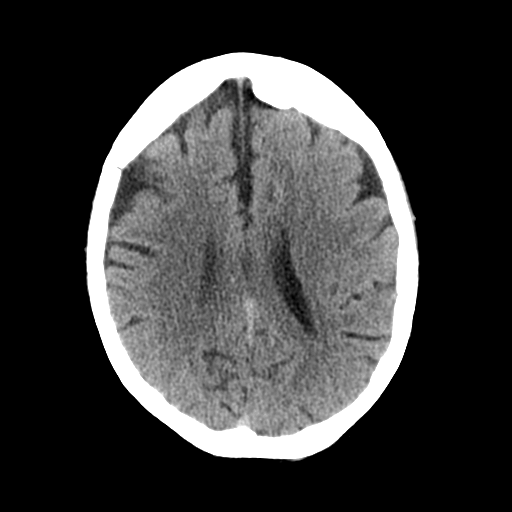
[im 20/30  brain]
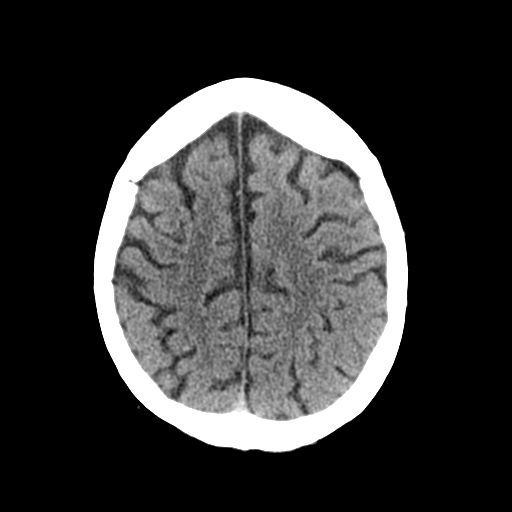
[im 23/30  brain]
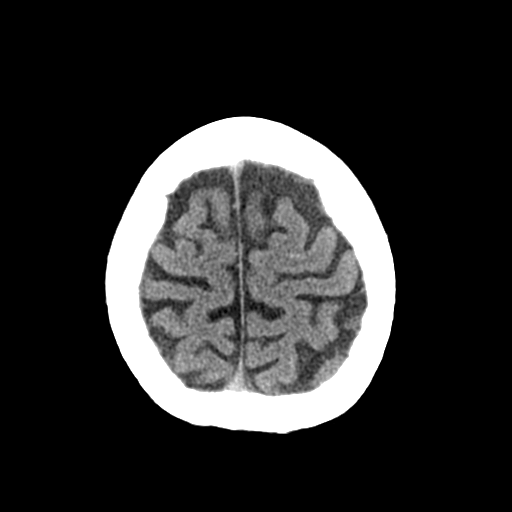
[im 25/30  brain]
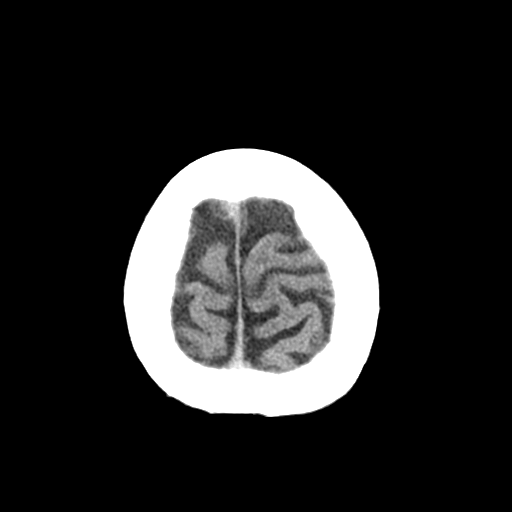
[im 25/30  bone]
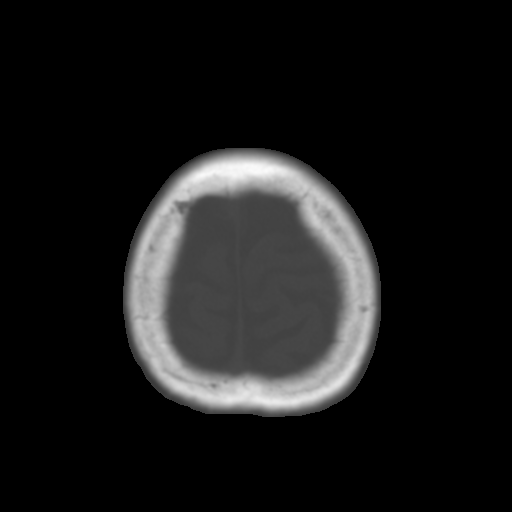
[im 28/30  brain]
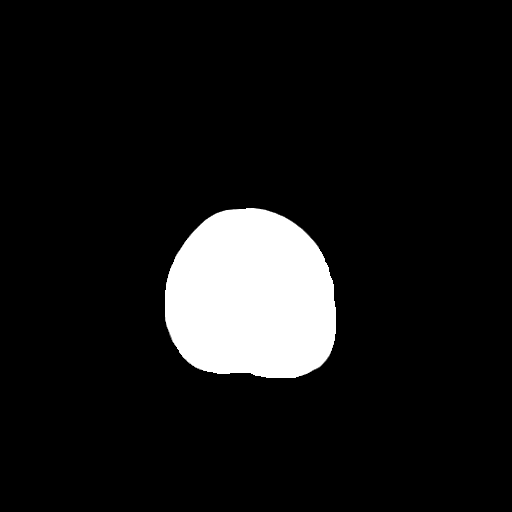

[Series 4: coronal soft tissue · coronal · 0.29mm/px · 3 of 64 slices shown]
[im 22/64  brain]
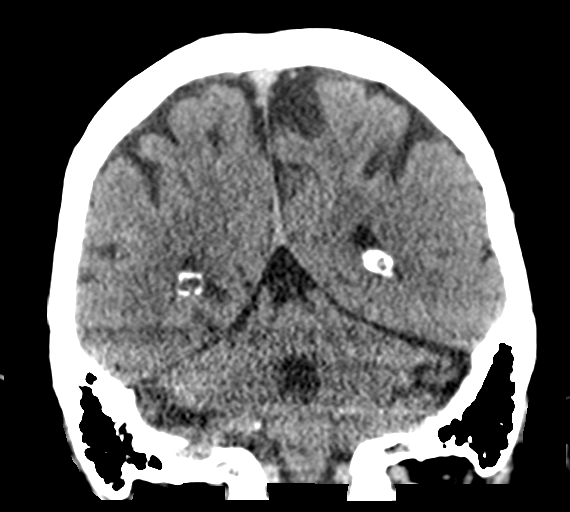
[im 29/64  brain]
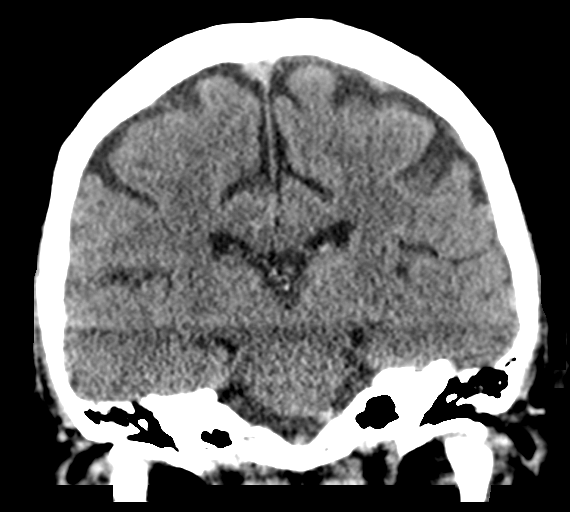
[im 36/64  brain]
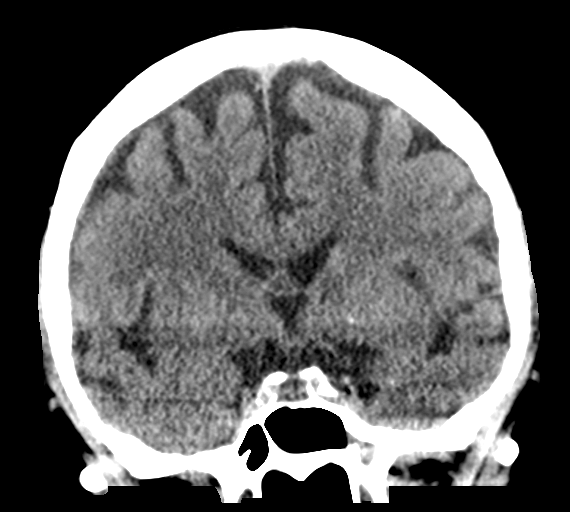

[Series 5: sagittal soft tissue · sagittal · 0.29mm/px · 3 of 56 slices shown]
[im 19/56  brain]
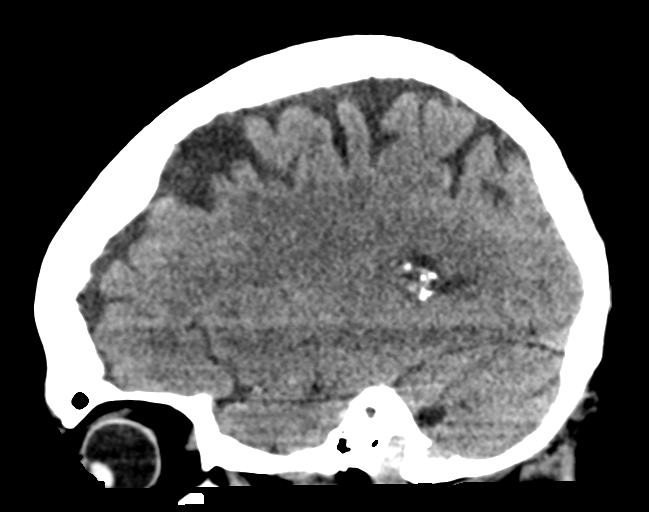
[im 28/56  brain]
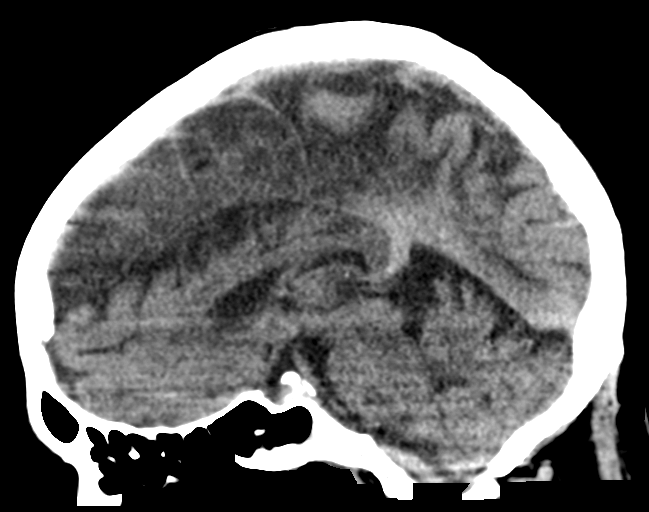
[im 37/56  brain]
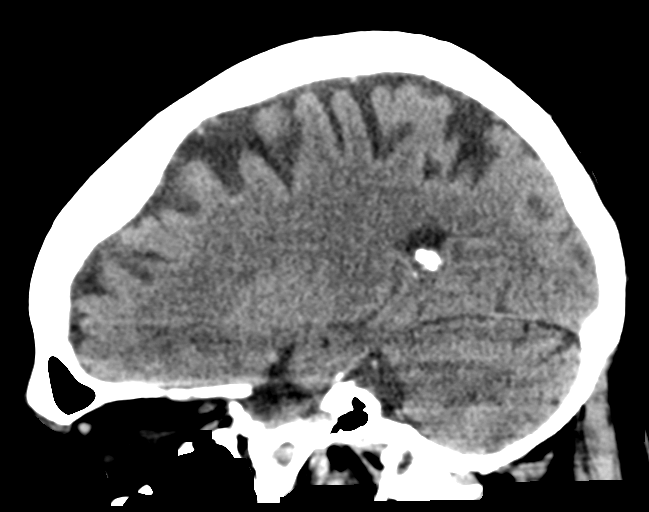

[16 of 47 positions shown; findings below may reference images not displayed]

FINDINGS: Brain: Old right occipital lobe infarct. Brain parenchyma is
otherwise normal. Normal extra-axial spaces.

Vascular: Normal

Skull: Normal

Sinuses/Orbits: Fluid in the left sphenoid sinus.

Other: None
IMPRESSION: 1. No acute intracranial abnormality.
2. Old right occipital lobe infarct.
3. Fluid in the left sphenoid sinus. Correlate for symptoms of acute
sinusitis.

## 2019-09-20 MED ORDER — MECLIZINE HCL 25 MG PO TABS: 25.0000 mg | ORAL_TABLET | Freq: Three times a day (TID) | ORAL | 0 refills | Status: DC | PRN

## 2019-09-20 MED ORDER — SODIUM CHLORIDE 0.9% FLUSH
3.0000 mL | Freq: Once | INTRAVENOUS | Status: DC
Start: 2019-09-20 — End: 2019-09-21

## 2019-09-20 MED ORDER — ACETAMINOPHEN 500 MG PO TABS
1000.0000 mg | ORAL_TABLET | Freq: Once | ORAL | Status: AC
  Administered 2019-09-20: 1000 mg via ORAL
  Filled 2019-09-20: qty 2

## 2019-09-20 MED ORDER — AMOXICILLIN-POT CLAVULANATE 875-125 MG PO TABS
1.0000 | ORAL_TABLET | Freq: Once | ORAL | Status: AC
  Administered 2019-09-20: 1 via ORAL
  Filled 2019-09-20: qty 1

## 2019-09-20 MED ORDER — AMOXICILLIN-POT CLAVULANATE 875-125 MG PO TABS: 1.0000 | ORAL_TABLET | Freq: Two times a day (BID) | ORAL | 0 refills | Status: DC

## 2019-09-20 NOTE — ED Provider Notes (Addendum)
[facility] DEPT Provider Note   CSN: [id number] Arrival date & time: 09/20/19  1921     History Chief Complaint  Patient presents with  . Dizziness    Darlene [name] is a 86 y.o. female.  The history is provided by the patient and medical records. No language interpreter was used.  Dizziness    86 year old female significant history of prior stroke currently on Plavix, aspirin, anxiety, hypertension brought here by family member from home for evaluation of headache and dizziness.  Patient reports she has had trouble with her sinus for the past several weeks and currently being cared for by a specialist.  She endorsed fullness in her ear recurrently.  Since 1230 today she endorsed feeling dizziness and described as feeling imbalance.  That was waxing waning.  Approximately 2 hours ago she experiencing a pulsating sensation to the top of her head without any significant pain.  Her friend noted that she was having some slight left-sided facial droop prompting this ER visit.  She admits to having history of prior stroke causing left peripheral field vision.  She denies any significant headache, loss of hearing, confusion, facial numbness, neck pain, chest pain, trouble breathing, abdominal pain, arm weakness or leg weakness.  Past Medical History:  Diagnosis Date  . Anxiety   . Asthma 04/06/2018  . Colon polyps   . Depression   . Hyperlipidemia   . Hypertension   . Hypothyroidism   . Skin cancer   . Sleep apnea   . Stroke [facility]) 2008    Patient Active Problem List   Diagnosis Date Noted  . Melanoma of skin ([place]) 08/24/2019  . Palpitations 06/07/2019  . Schatzki's ring 05/12/2019  . Hiatal hernia 05/12/2019  . Insomnia 03/29/2019  . Back pain with left-sided sciatica 03/01/2019  . Daytime sleepiness 11/30/2018  . History of colonic polyps 09/03/2018  . Dysphagia 09/03/2018  . Pyrosis 09/03/2018  . Chronic sore throat 09/03/2018  . Actinic  keratoses 02/23/2018  . Seasonal and perennial allergic rhinitis 01/31/2018  . Incontinent of urine 12/01/2017  . Essential hypertension 09/06/2017  . History of CVA (cerebrovascular accident) 09/06/2017  . Hyperlipidemia 09/06/2017  . GAD (generalized anxiety disorder) 09/06/2017  . Acquired hypothyroidism 09/06/2017  . Chronic sinusitis 09/06/2017  . Gastroesophageal reflux disease 09/06/2017    Past Surgical History:  Procedure Laterality Date  . ADENOIDECTOMY    . APPENDECTOMY  1960  . COLONOSCOPY    . GUM SURGERY  03/2019  . [place] SURGERY  2014  . MELANOMA EXCISION  2020  . MESH APPLIED TO LAP PORT    . TONSILLECTOMY  1940     OB History   No obstetric history on file.     Family History  Problem Relation Age of Onset  . Lung cancer Mother 49       smoker  . Heart disease Mother   . Stroke Father 63  . Heart attack Father   . Allergic rhinitis Sister   . Hypertension Sister   . CVA Brother   . Lung cancer Maternal Grandfather   . Heart attack Paternal Grandfather   . Psoriasis Daughter   . Rheum arthritis Daughter   . Bipolar disorder Daughter     Social History   Tobacco Use  . Smoking status: Never Smoker  . Smokeless tobacco: Never Used  Vaping Use  . Vaping Use: Never used  Substance Use Topics  . Alcohol use: Yes    Comment: hardly  ever  . Drug use: Never    Home Medications Prior to Admission medications   Medication Sig Start Date End Date Taking? Authorizing Provider  acetaminophen (TYLENOL) 325 MG tablet Take 650 mg by mouth as needed (for sinus headache).    [provider]  azelastine (ASTELIN) 0.1 % nasal spray Place 2 sprays into both nostrils 2 (two) times daily. 01/31/18   Valentina Shaggy, MD  clopidogrel (PLAVIX) 75 MG tablet TAKE 1 TABLET BY MOUTH EVERY DAY 04/30/19   [name], [name], NP  diazepam (VALIUM) 5 MG tablet Take 2.5 mg by mouth as needed (anxiety).     [provider]  fluticasone (FLONASE) 50  MCG/ACT nasal spray Place 2 sprays into both nostrils at bedtime. 05/06/19   [provider]  levothyroxine (SYNTHROID) 100 MCG tablet Take 1 tablet (100 mcg total) by mouth daily before breakfast. 04/30/19   [name], [name], NP  losartan (COZAAR) 50 MG tablet TAKE 1 TABLET BY MOUTH EVERY DAY 09/11/19   Virgie Dad, MD  pantoprazole (PROTONIX) 40 MG tablet Take 1 tablet (40 mg total) by mouth daily. 05/31/19   [name], [name], NP  pravastatin (PRAVACHOL) 40 MG tablet Take 40 mg by mouth at bedtime.     [provider]  sodium chloride (OCEAN) 0.65 % SOLN nasal spray Place 1 spray into both nostrils as needed for congestion.    [provider]  triamcinolone cream (KENALOG) 0.1 % Apply 1 application topically at bedtime. Apply to chest (or keratosis) 01/24/19   [provider]  Zolpidem Tartrate 3.5 MG SUBL PLACE 3.5 MG UNDER THE TONGUE AT BEDTIME AS NEEDED. 08/06/19   [name], [name], NP    Allergies    Norco [hydrocodone-acetaminophen], Dust mite extract, Oxycontin [oxycodone hcl], and Pollen extract  Review of Systems   Review of Systems  Neurological: Positive for dizziness.  All other systems reviewed and are negative.   Physical Exam Updated Vital Signs BP (!) 180/73   Pulse 65   Temp (!) 97.5 F (36.4 C) (Oral)   Resp (!) 22   Ht 5\' 3"  (1.6 m)   SpO2 100%   BMI 27.92 kg/m   Physical Exam Vitals and nursing note reviewed.  Constitutional:      General: She is not in acute distress.    Appearance: She is well-developed.  HENT:     Head: Normocephalic and atraumatic.     Left Ear: Tympanic membrane normal.     Ears:     Comments: R TM with effusion but non erythematous.     Nose: Nose normal.     Mouth/Throat:     Mouth: Mucous membranes are moist.  Eyes:     Conjunctiva/sclera: Conjunctivae normal.     Pupils: Pupils are equal, round, and reactive to light.     Comments: Left peripheral vision loss from prior stroke  Cardiovascular:     Rate  and Rhythm: Normal rate and regular rhythm.     Pulses: Normal pulses.     Heart sounds: Normal heart sounds.  Pulmonary:     Effort: Pulmonary effort is normal.     Breath sounds: Normal breath sounds.  Abdominal:     Palpations: Abdomen is soft.     Tenderness: There is no abdominal tenderness.  Musculoskeletal:     Cervical back: Normal range of motion and neck supple.  Skin:    Findings: No rash.  Neurological:     Mental Status: She  is alert and oriented to person, place, and time.     Comments: Neurologic exam:  Speech clear, pupils equal round reactive to light, left peripheral field loss (prior stroke) Cranial nerves III through XII, mild left facial droop Follows commands, moves all extremities x4, normal strength to bilateral upper and lower extremities at all major muscle groups including grip Sensation normal to light touch and pinprick Coordination intact, no limb ataxia, finger-nose-finger normal No pronator drift Gait normal   Psychiatric:        Mood and Affect: Mood normal.     ED Results / Procedures / Treatments   Labs (all labs ordered are listed, but only abnormal results are displayed) Labs Reviewed  COMPREHENSIVE METABOLIC PANEL - Abnormal; Notable for the following components:      Result Value   Glucose, Bld 130 (*)    All other components within normal limits  I-STAT CHEM 8, ED - Abnormal; Notable for the following components:   Glucose, Bld 126 (*)    All other components within normal limits  CBG MONITORING, ED - Abnormal; Notable for the following components:   Glucose-Capillary 105 (*)    All other components within normal limits  I-STAT BETA HCG BLOOD, ED ([facility], [facility], [facility] ONLY) - Abnormal; Notable for the following components:   I-stat hCG, quantitative 7.5 (*)    All other components within normal limits  PROTIME-INR  APTT  CBC  DIFFERENTIAL    EKG EKG Interpretation  Date/Time:  Thursday September 20 2019 20:21:45 EDT Ventricular  Rate:  63 PR Interval:    QRS Duration: 86 QT Interval:  451 QTC Calculation: 462 R Axis:   24 Text Interpretation: Sinus rhythm Low voltage, precordial leads Abnormal R-wave progression, early transition Borderline T abnormalities, anterior leads Confirmed by Pattricia Boss [phone]) on 09/20/2019 10:13:31 PM   Radiology CT HEAD WO CONTRAST  Result Date: 09/20/2019 CLINICAL DATA:  Dizziness EXAM: CT HEAD WITHOUT CONTRAST TECHNIQUE: Contiguous axial images were obtained from the base of the skull through the vertex without intravenous contrast. COMPARISON:  None. 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 Brain parenchyma is otherwise normal. Normal extra-axial spaces. Vascular: Normal Skull: Normal Sinuses/Orbits: Fluid in the left sphenoid sinus. Other: None IMPRESSION: 1. No acute intracranial abnormality. 2. 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 3. Fluid in the left sphenoid sinus. Correlate for symptoms of acute sinusitis. Electronically Signed   By: Ulyses Jarred M.D.   On: 09/20/2019 20:18   MR BRAIN WO CONTRAST  Result Date: 09/20/2019 CLINICAL DATA:  Dizziness with pulsating sensation EXAM: MRI HEAD WITHOUT CONTRAST TECHNIQUE: Multiplanar, multiecho pulse sequences of the brain and surrounding structures were obtained without intravenous contrast. COMPARISON:  None. FINDINGS: Brain: No acute infarct, acute hemorrhage or extra-axial collection. Normal white matter signal. Normal volume of CSF spaces. No chronic microhemorrhage. Normal midline structures. Vascular: Normal flow voids. Skull and upper cervical spine: Normal marrow signal. Sinuses/Orbits: Sinuses are clear.  Normal orbits. Other: None IMPRESSION: Normal brain MRI. Electronically Signed   By: Ulyses Jarred M.D.   On: 09/20/2019 21:11    Procedures Procedures (including critical care time)  Medications Ordered in ED Medications  sodium chloride flush (NS) 0.9 % injection 3 mL (has no administration in time range)    ED  Course  I have reviewed the triage vital signs and the nursing notes.  Pertinent labs & imaging results that were available during my care of the patient were reviewed by me and considered in my medical  decision making (see chart for details).    MDM Rules/Calculators/A&P                          BP (!) 180/73   Pulse 65   Temp (!) 97.5 F (36.4 C) (Oral)   Resp (!) 22   Ht 5\' 3"  (1.6 m)   SpO2 100%   BMI 27.92 kg/m   Final Clinical Impression(s) / ED Diagnoses Final diagnoses:  Sphenoid sinusitis, unspecified chronicity    Rx / DC Orders ED Discharge Orders         Ordered    amoxicillin-clavulanate (AUGMENTIN) 875-125 MG tablet  2 times daily        09/20/19 2153    meclizine (ANTIVERT) 25 MG tablet  3 times daily PRN        09/20/19 2153         8:22 PM Patient here with complaints of dizziness which he described as feeling unsteady.  Last known normal was 8 hours ago.  Also report a foreign sensation in her right ear as well as pulsating sensation to the top of her head without any significant headache.  Patient was found to be hypertensive with initial blood pressure of 180/73.Mild left facial droop were noted.  No other focal neuro deficit on initial exam.  Head CT scan shows no acute intracranial abnormalities.  An old right occipital lobe infarct was noted.  Fluid in the left sphenoid sinus.  Correlate for symptoms of acute sinusitis.  I suspect her symptoms may be due to sinusitis however out of abundance of precaution, will obtain brain MRI to rule out acute stroke.  Care discussed with Dr. Jeanell Sparrow.   8:26 PM Labs are reassuring.  MRI of the brain shows a normal brain MRI.  Patient now endorses tenderness to her mid forehead and towards the bridge of her nose.  This coincides with her CT finding of left sphenoid sinusitis.  My plan is to prescribe Augmentin as treatment.  She will receive Tylenol for her headache.  She will follow-up with her ENT specialist, Dr. Lucia Gaskins  for further care.  Return precaution discussed.  No evidence of stroke.   Domenic Moras, PA-C 09/20/19 2156    Domenic Moras, PA-C 09/20/19 2213    Pattricia Boss, MD 09/20/19 [id number]

## 2019-09-20 NOTE — ED Triage Notes (Signed)
Pt states around 1230 today she had onset of dizziness. Pt states 1 hour ago onset of intermittent "pulsating" discomfort on the top of her head.  No nausea/vomiting. No change in vision. Does have hx of stroke. On Plavix. Mild left facial droop.

## 2019-09-20 NOTE — Discharge Instructions (Addendum)
Your dizziness and headache is likely due to acute sinusitis.  Take antibiotic as prescribed.  Call and follow up closely with your ENT specialist for further care. Return if you have any concerns.

## 2019-09-26 ENCOUNTER — Other Ambulatory Visit: Payer: Self-pay | Admitting: Pharmacist

## 2019-09-26 DIAGNOSIS — E7800 Pure hypercholesterolemia, unspecified: Secondary | ICD-10-CM

## 2019-09-26 MED ORDER — PRAVASTATIN SODIUM 40 MG PO TABS: 40.0000 mg | ORAL_TABLET | Freq: Every day | ORAL | 2 refills | Status: DC

## 2019-09-27 ENCOUNTER — Other Ambulatory Visit: Payer: Self-pay | Admitting: Gastroenterology

## 2019-09-27 ENCOUNTER — Telehealth: Payer: Self-pay | Admitting: Gastroenterology

## 2019-09-27 DIAGNOSIS — K219 Gastro-esophageal reflux disease without esophagitis: Secondary | ICD-10-CM

## 2019-09-27 MED ORDER — PANTOPRAZOLE SODIUM 40 MG PO TBEC: 40.0000 mg | DELAYED_RELEASE_TABLET | Freq: Every day | ORAL | 3 refills | Status: DC

## 2019-09-27 NOTE — Telephone Encounter (Signed)
Left message on machine to call back  

## 2019-09-27 NOTE — Telephone Encounter (Signed)
The pt returned call and wants to make an appt to discuss GERD and EGD with Dr Rush Landmark.  Also, she needs a refill on protonix.  I have refilled her protonix and made her an appt for 11/5.

## 2019-10-03 ENCOUNTER — Ambulatory Visit: Payer: Medicare Other | Admitting: Physician Assistant

## 2019-10-03 ENCOUNTER — Other Ambulatory Visit: Payer: Self-pay

## 2019-10-03 DIAGNOSIS — L219 Seborrheic dermatitis, unspecified: Secondary | ICD-10-CM

## 2019-10-03 DIAGNOSIS — L659 Nonscarring hair loss, unspecified: Secondary | ICD-10-CM | POA: Diagnosis not present

## 2019-10-03 MED ORDER — BETAMETHASONE DIPROPIONATE 0.05 % EX LOTN: TOPICAL_LOTION | Freq: Every day | CUTANEOUS | 3 refills | Status: DC

## 2019-10-03 MED ORDER — KETOCONAZOLE 2 % EX SHAM: 1.0000 "application " | MEDICATED_SHAMPOO | Freq: Once | CUTANEOUS | 2 refills | Status: AC

## 2019-10-05 ENCOUNTER — Telehealth: Payer: Self-pay | Admitting: Gastroenterology

## 2019-10-05 ENCOUNTER — Encounter: Payer: Self-pay | Admitting: Physician Assistant

## 2019-10-05 DIAGNOSIS — K219 Gastro-esophageal reflux disease without esophagitis: Secondary | ICD-10-CM

## 2019-10-05 MED ORDER — OMEPRAZOLE 40 MG PO CPDR: 40.0000 mg | DELAYED_RELEASE_CAPSULE | Freq: Every day | ORAL | 3 refills | Status: DC

## 2019-10-05 NOTE — Telephone Encounter (Signed)
The pt received a refill of protonix but prefers to have omeprazole.  Prescription sent as requested.

## 2019-10-05 NOTE — Progress Notes (Signed)
   Follow up Visit  Subjective  Darlene [name] is a 86 y.o. female who presents for the following: Alopecia (X 6 MONTHS. Patient says her scalp is like cradel cap. Crusty like.It does itch. Treament is some kind of ointment she uses when it itches. She left bottle at home. ). She has a scaling, itching head and feels she has had hair loss. She feels this has been going on for years and has been called cradle cap.  Objective  Well appearing patient in no apparent distress; mood and affect are within normal limits.  A focused examination was performed including scalp. Relevant physical exam findings are noted in the Assessment and Plan.   Objective  Mid Parietal Scalp, Scalp: General thinning and it appears to be some scarring and loss of follicles. 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  Objective  Mid Parietal Scalp: Erythematous plaques with greasy scale.    Assessment & Plan  Alopecia (2) Mid Parietal Scalp; Scalp  Patient doesn't really want to do any special treatment for this or have any labs done  Seborrheic dermatitis Mid Parietal Scalp  ketoconazole (NIZORAL) 2 % shampoo - Mid Parietal Scalp  betamethasone dipropionate 0.05 % lotion - Mid Parietal Scalp

## 2019-10-22 ENCOUNTER — Other Ambulatory Visit: Payer: Self-pay | Admitting: Nurse Practitioner

## 2019-10-22 NOTE — Telephone Encounter (Signed)
Mickey Farber or Dr.Gupta can you confirm if this is a [facility] patient. The PCP has another provider listed.

## 2019-10-23 NOTE — Telephone Encounter (Signed)
Complete

## 2019-10-24 NOTE — Telephone Encounter (Signed)
[facility] has Dr. Lyndel Safe as PCP but she is on a therapeutic leave. To Dr. Lyndel Safe

## 2019-11-09 ENCOUNTER — Other Ambulatory Visit: Payer: Self-pay

## 2019-11-09 ENCOUNTER — Ambulatory Visit (INDEPENDENT_AMBULATORY_CARE_PROVIDER_SITE_OTHER): Payer: Medicare Other | Admitting: Otolaryngology

## 2019-11-09 DIAGNOSIS — K219 Gastro-esophageal reflux disease without esophagitis: Secondary | ICD-10-CM | POA: Diagnosis not present

## 2019-11-09 DIAGNOSIS — J312 Chronic pharyngitis: Secondary | ICD-10-CM | POA: Diagnosis not present

## 2019-11-09 NOTE — Progress Notes (Signed)
HPI: Darlene [name] is a 86 y.o. female who returns today for evaluation of sore throat and some difficulty swallowing.  She was recently visiting family in [place].  She had some intermittent throat discomfort and some difficulty swallowing but was able to get food down.  She is scheduled to see GI next month and called to see them earlier but was unable to get an earlier appointment. She is having no hoarseness. Her dizziness and balance is doing better.  Her nasal congestion did better up in [place] is a little bit worse since returning to [place]..  Past Medical History:  Diagnosis Date  . Anxiety   . Asthma 04/06/2018  . Colon polyps   . Depression   . Hyperlipidemia   . Hypertension   . Hypothyroidism   . Skin cancer   . Sleep apnea   . Stroke [facility]) 2008   Past Surgical History:  Procedure Laterality Date  . ADENOIDECTOMY    . APPENDECTOMY  1960  . COLONOSCOPY    . GUM SURGERY  03/2019  . [place] SURGERY  2014  . MELANOMA EXCISION  2020  . MESH APPLIED TO LAP PORT    . TONSILLECTOMY  1940   Social History   Socioeconomic History  . Marital status: Widowed    Spouse name: Not on file  . Number of children: 3  . Years of education: Not on file  . Highest education level: Not on file  Occupational History  . Occupation: retired Radiographer, therapeutic  Tobacco Use  . Smoking status: Never Smoker  . Smokeless tobacco: Never Used  Vaping Use  . Vaping Use: Never used  Substance and Sexual Activity  . Alcohol use: Yes    Comment: hardly ever  . Drug use: Never  . Sexual activity: Not on file  Other Topics Concern  . Not on file  Social History Narrative   Social History      Diet? ok      Do you drink/eat things with caffeine? Weak coffee, (much milk)      Marital status?          divorced                          What year were you married? 1961      Do you live in a house, apartment, assisted living, condo, trailer, etc.? apartment       Is it one or more stories? 4      How many persons live in your home? Only me      Do you have any pets in your home? (please list) no      Highest level of education completed? 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             yes                         Type & how often? Walk- would love to return to water exercise      Advanced Directives      Do you have a living will? yes      Do you have a DNR form?           no  If not, do you want to discuss one? no      Do you have signed POA/HPOA for forms? no      Functional Status      Do you have difficulty bathing or dressing yourself?  no      Do you have difficulty preparing food or eating? no      Do you have difficulty managing your medications? no      Do you have difficulty managing your finances?  no      Do you have difficulty affording your medications? no      Social Determinants of Health   Financial Resource Strain:   . Difficulty of Paying Living Expenses: Not on file  Food Insecurity:   . Worried About Charity fundraiser in the Last Year: Not on file  . Ran Out of Food in the Last Year: Not on file  Transportation Needs:   . Lack of Transportation (Medical): Not on file  . Lack of Transportation (Non-Medical): Not on file  Physical Activity:   . Days of Exercise per Week: Not on file  . Minutes of Exercise per Session: Not on file  Stress:   . Feeling of Stress : Not on file  Social Connections:   . Frequency of Communication with Friends and Family: Not on file  . Frequency of Social Gatherings with Friends and Family: Not on file  . Attends Religious Services: Not on file  . Active Member of Clubs or Organizations: Not on file  . Attends Archivist Meetings: Not on file  . Marital Status: Not on file   Family History  Problem Relation Age of Onset  . Lung cancer Mother 15       smoker   . Heart disease Mother   . Stroke Father 82  . Heart attack Father   . Allergic rhinitis Sister   . Hypertension Sister   . CVA Brother   . Lung cancer Maternal Grandfather   . Heart attack Paternal Grandfather   . Psoriasis Daughter   . Rheum arthritis Daughter   . Bipolar disorder Daughter    Allergies  Allergen Reactions  . Norco [Hydrocodone-Acetaminophen] Nausea And Vomiting  . Dust Mite Extract   . Oxycontin [Oxycodone Hcl] Nausea And Vomiting  . Pollen Extract    Prior to Admission medications   Medication Sig Start Date End Date Taking? Authorizing Provider  SYNTHROID 100 MCG tablet TAKE 1 TABLET BY MOUTH DAILY BEFORE BREAKFAST. 10/24/19   Virgie Dad, MD  acetaminophen (TYLENOL) 325 MG tablet Take 325-650 mg by mouth every 6 (six) hours as needed for moderate pain (for sinus headache).     [provider]  amoxicillin-clavulanate (AUGMENTIN) 875-125 MG tablet Take 1 tablet by mouth 2 (two) times daily. One po bid x 7 days 09/20/19   Domenic Moras, PA-C  azelastine (ASTELIN) 0.1 % nasal spray Place 2 sprays into both nostrils 2 (two) times daily. 01/31/18   Valentina Shaggy, MD  betamethasone dipropionate 0.05 % lotion Apply topically daily. 10/03/19   Clark-Bruning, Anderson Malta, PA-C  clopidogrel (PLAVIX) 75 MG tablet TAKE 1 TABLET BY MOUTH EVERY DAY 04/30/19   [name], [name], NP  diazepam (VALIUM) 5 MG tablet Take 2.5 mg by mouth as needed (anxiety).     [provider]  fluticasone (FLONASE) 50 MCG/ACT nasal spray Place 2 sprays into both nostrils in the morning and at bedtime.  05/06/19   [provider]  losartan (  COZAAR) 50 MG tablet TAKE 1 TABLET BY MOUTH EVERY DAY 09/11/19   Virgie Dad, MD  meclizine (ANTIVERT) 25 MG tablet Take 1 tablet (25 mg total) by mouth 3 (three) times daily as needed for dizziness. 09/20/19   Domenic Moras, PA-C  montelukast (SINGULAIR) 10 MG tablet Take by mouth. 10/02/19 12/31/19  [provider]  omeprazole  (PRILOSEC) 40 MG capsule Take 1 capsule (40 mg total) by mouth daily. 10/05/19 01/03/20  Mansouraty, Telford Nab., MD  pravastatin (PRAVACHOL) 40 MG tablet Take 1 tablet (40 mg total) by mouth at bedtime. 09/26/19   Adrian Prows, MD  predniSONE (DELTASONE) 10 MG tablet Take 10 mg by mouth daily. 10/02/19   [provider]  sodium chloride (OCEAN) 0.65 % SOLN nasal spray Place 1 spray into both nostrils as needed for congestion.    [provider]  triamcinolone cream (KENALOG) 0.1 % Apply 1 application topically at bedtime. Apply to chest (or keratosis) 01/24/19   [provider]  Zolpidem Tartrate 3.5 MG SUBL PLACE 3.5 MG UNDER THE TONGUE AT BEDTIME AS NEEDED. Patient taking differently: Place 3.5 mg under the tongue at bedtime as needed (sleep).  08/06/19   [name], [name], NP     Positive ROS: Otherwise negative  All other systems have been reviewed and were otherwise negative with the exception of those mentioned in the HPI and as above.  Physical Exam: Constitutional: Alert, well-appearing, no acute distress Ears: External ears without lesions or tenderness. Ear canals are clear bilaterally with intact, clear TMs.  Nasal: External nose without lesions. Septum is deviated to the right. Clear nasal passages otherwise. Oral: Lips and gums without lesions. Tongue and palate mucosa without lesions. Posterior oropharynx clear.  Patient is status post tonsillectomy. Fiberoptic laryngoscopy was performed to the left nostril.  The nasopharynx was clear.  The base of tongue vallecula epiglottis were normal.  Vocal cords were clear bilaterally.  Both piriform sinuses were clear.  Fiberoptic laryngoscope was passed through the upper esophageal sphincter without difficulty and the upper cervical esophagus was clear. Neck: No palpable adenopathy or masses Respiratory: Breathing comfortably  Skin: No facial/neck lesions or rash noted.  Laryngoscopy  Date/Time: 11/09/2019 4:29  PM Performed by: Rozetta Nunnery, MD Authorized by: Rozetta Nunnery, MD   Consent:    Consent obtained:  Verbal   Consent given by:  Patient Procedure details:    Indications: hoarseness, dysphagia, or aspiration     Medication:  Afrin   Instrument: flexible fiberoptic laryngoscope     Scope location: left nare   Sinus:    Left nasopharynx: normal   Mouth:    Oropharynx: normal     Vallecula: normal     Base of tongue: normal     Epiglottis: normal   Throat:    Pyriform sinus: normal     True vocal cords: normal   Comments:     On fiberoptic laryngoscopy the hypopharynx and larynx was clear.  Passed the scope through the upper esophageal sphincter without difficulty with a clear upper cervical esophagus.    Assessment: Patient with complaints of sore throat and difficulty swallowing at times. Normal upper airway exam on fiberoptic laryngoscopy. 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  Plan: Presently patient is taking omeprazole at night when she goes to bed and recommended changing it to before dinner.  Reassured her of normal upper airway exam otherwise recommended following up with GI next month.

## 2019-11-17 ENCOUNTER — Other Ambulatory Visit: Payer: Self-pay | Admitting: Nurse Practitioner

## 2019-11-19 NOTE — Telephone Encounter (Signed)
Refill request received, medication pended and sent to Dr.Gupta for approval due to high alert

## 2019-11-19 NOTE — Telephone Encounter (Signed)
Pharmacy requested refill.  °Pended Rx and sent to Dr. Gupta for approval due to HIGH ALERT Warning.  °

## 2019-11-21 ENCOUNTER — Telehealth: Payer: Self-pay

## 2019-11-21 NOTE — Telephone Encounter (Deleted)
Patient called to state that she now has a new PCP, Daiva Eves with [facility] off campus because its been difficult for her to see one provider consistently with the providers alternating at [facility].   Patient has an upcoming apnt 11/11 that she wasn't aware. She was pleased to find this out and states, she will see her one last time because she enjoys seeing [facility] and will go from there.

## 2019-11-21 NOTE — Telephone Encounter (Signed)
Opened in error

## 2019-11-23 ENCOUNTER — Ambulatory Visit: Payer: Medicare Other | Admitting: Gastroenterology

## 2019-11-23 VITALS — BP 150/82 | HR 72 | Ht 63.0 in | Wt 159.0 lb

## 2019-11-23 DIAGNOSIS — R12 Heartburn: Secondary | ICD-10-CM

## 2019-11-23 DIAGNOSIS — R1310 Dysphagia, unspecified: Secondary | ICD-10-CM

## 2019-11-23 DIAGNOSIS — K449 Diaphragmatic hernia without obstruction or gangrene: Secondary | ICD-10-CM | POA: Diagnosis not present

## 2019-11-23 DIAGNOSIS — K219 Gastro-esophageal reflux disease without esophagitis: Secondary | ICD-10-CM

## 2019-11-23 DIAGNOSIS — R053 Chronic cough: Secondary | ICD-10-CM

## 2019-11-23 DIAGNOSIS — R1319 Other dysphagia: Secondary | ICD-10-CM

## 2019-11-23 MED ORDER — OMEPRAZOLE 40 MG PO CPDR: 40.0000 mg | DELAYED_RELEASE_CAPSULE | Freq: Two times a day (BID) | ORAL | 3 refills | Status: DC

## 2019-11-23 NOTE — Patient Instructions (Addendum)
We have sent the following medications to your pharmacy for you to pick up at your convenience: Omeprazole 40mg - Increase your omeprazole back to 40mg  twice daily.   You have been scheduled for an endoscopy. Please follow written instructions given to you at your visit today. If you use inhalers (even only as needed), please bring them with you on the day of your procedure.  You will be contaced by our office prior to your procedure for directions on holding your Plavix.  If you do not hear from our office 1 week prior to your scheduled procedure, please call [phone] to discuss.   If you are age 84 or older, your body mass index should be between 23-30. Your Body mass index is 28.17 kg/m. If this is out of the aforementioned range listed, please consider follow up with your Primary Care Provider.  If you are age 74 or younger, your body mass index should be between 19-25. Your Body mass index is 28.17 kg/m. If this is out of the aformentioned range listed, please consider follow up with your Primary Care Provider.    Thank you for choosing me and [facility].  Dr. Rush Landmark

## 2019-11-24 ENCOUNTER — Encounter: Payer: Self-pay | Admitting: Gastroenterology

## 2019-11-24 DIAGNOSIS — R053 Chronic cough: Secondary | ICD-10-CM | POA: Insufficient documentation

## 2019-11-24 NOTE — Progress Notes (Signed)
[facility] VISIT   Primary Care Provider Virgie Dad, MD [address] [place] [id number] [phone]  Patient Profile: Darlene [name] is a 86 y.o. female with a pmh significant for CVA (on Plavix), atrial fibrillation, anxiety/depression, sleep apnea, colon polyps (unknown pathology), status post appendectomy, seborrheic keratoses (seen dermatology in the past), chronic sore throat, esophagitis, hiatal hernia, diverticulosis, hemorrhoids, colonic AVM, adenomatous colon polyps.  The patient presents to the [facility] for an evaluation and management of problem(s) noted below:  Problem List 1. Gastroesophageal reflux disease, unspecified whether esophagitis present   2. Dysphagia, unspecified type   3. Pyrosis   4. Hiatal hernia   5. Chronic cough   6. Other dysphagia     History of Present Illness Please see initial consultation note and prior progress notes for full details of HPI.  Interval History The patient presents for an unscheduled follow-up.  Due to issues this spring, the patient could not have her upper endoscopy performed.  She has since followed up with ENT where she underwent an in office upper airway evaluation.  She was asked to change the timing of her PPI.  Since going to once daily PPI she has noted some recurrent symptoms of GERD as well as dysphagia.  She continues to have her chronic phlegm and chronic sore throat.  She denies significant abdominal pain or nausea or vomiting.  She is wondering if she will ever be 100% better to where she can come off medications completely.  GI Review of Systems Positive as above Negative for odynophagia, pain, change in bowel habits, melena, hematochezia  Review of Systems General: Denies fevers/chills/weight loss unintentionally Cardiovascular: Denies chest pain/palpitations Pulmonary: Denies shortness of breath Gastroenterological: See HPI Genitourinary: Denies  darkened urine or hematuria Hematological: Positive for easy bruising/bleeding due to Plavix Dermatological: Denies jaundice Psychological: Mood is stable   Medications Current Outpatient Medications  Medication Sig Dispense Refill  . acetaminophen (TYLENOL) 325 MG tablet Take 325-650 mg by mouth every 6 (six) hours as needed for moderate pain (for sinus headache).     Marland Kitchen azelastine (ASTELIN) 0.1 % nasal spray Place 2 sprays into both nostrils 2 (two) times daily. 30 mL 5  . clopidogrel (PLAVIX) 75 MG tablet TAKE 1 TABLET BY MOUTH EVERY DAY 90 tablet 1  . diazepam (VALIUM) 5 MG tablet Take 2.5 mg by mouth as needed (anxiety).     . fluticasone (FLONASE) 50 MCG/ACT nasal spray Place 2 sprays into both nostrils in the morning and at bedtime.     Marland Kitchen losartan (COZAAR) 50 MG tablet TAKE 1 TABLET BY MOUTH EVERY DAY 90 tablet 0  . pravastatin (PRAVACHOL) 40 MG tablet Take 1 tablet (40 mg total) by mouth at bedtime. 90 tablet 2  . predniSONE (DELTASONE) 10 MG tablet Take 10 mg by mouth daily.    . sodium chloride (OCEAN) 0.65 % SOLN nasal spray Place 1 spray into both nostrils as needed for congestion.    Marland Kitchen SYNTHROID 100 MCG tablet TAKE 1 TABLET BY MOUTH DAILY BEFORE BREAKFAST. 90 tablet 1  . Zolpidem Tartrate 3.5 MG SUBL PLACE 3.5 MG UNDER THE TONGUE AT BEDTIME AS NEEDED. (Patient taking differently: Place 3.5 mg under the tongue at bedtime as needed (sleep). ) 30 tablet 0  . omeprazole (PRILOSEC) 40 MG capsule Take 1 capsule (40 mg total) by mouth 2 (two) times daily before a meal. 90 capsule 3   Current Facility-Administered Medications  Medication Dose Route Frequency  Provider Last Rate Last Admin  . 0.9 %  sodium chloride infusion  500 mL Intravenous Once Mansouraty, Telford Nab., MD        Allergies Allergies  Allergen Reactions  . Norco [Hydrocodone-Acetaminophen] Nausea And Vomiting  . Dust Mite Extract   . Oxycontin [Oxycodone Hcl] Nausea And Vomiting  . Pollen Extract      Histories Past Medical History:  Diagnosis Date  . Anxiety   . Asthma 04/06/2018  . Colon polyps   . Depression   . Hyperlipidemia   . Hypertension   . Hypothyroidism   . Skin cancer   . Sleep apnea   . Stroke [facility]) 2008   Past Surgical History:  Procedure Laterality Date  . ADENOIDECTOMY    . APPENDECTOMY  1960  . COLONOSCOPY    . GUM SURGERY  03/2019  . [place] SURGERY  2014  . MELANOMA EXCISION  2020  . MESH APPLIED TO LAP PORT    . TONSILLECTOMY  1940   Social History   Socioeconomic History  . Marital status: Widowed    Spouse name: Not on file  . Number of children: 3  . Years of education: Not on file  . Highest education level: Not on file  Occupational History  . Occupation: retired Radiographer, therapeutic  Tobacco Use  . Smoking status: Never Smoker  . Smokeless tobacco: Never Used  Vaping Use  . Vaping Use: Never used  Substance and Sexual Activity  . Alcohol use: Yes    Comment: hardly ever  . Drug use: Never  . Sexual activity: Not on file  Other Topics Concern  . Not on file  Social History Narrative   Social History      Diet? ok      Do you drink/eat things with caffeine? Weak coffee, (much milk)      Marital status?          divorced                          What year were you married? 1961      Do you live in a house, apartment, assisted living, condo, trailer, etc.? apartment      Is it one or more stories? 4      How many persons live in your home? Only me      Do you have any pets in your home? (please list) no      Highest level of education completed? 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             yes                         Type & how often? Walk- would love to return to water exercise      Advanced Directives      Do you have a living will? yes      Do you have a DNR form?           no                       If not, do you want to  discuss one? no      Do you have signed POA/HPOA for forms? no      Functional  Status      Do you have difficulty bathing or dressing yourself?  no      Do you have difficulty preparing food or eating? no      Do you have difficulty managing your medications? no      Do you have difficulty managing your finances?  no      Do you have difficulty affording your medications? no      Social Determinants of Health   Financial Resource Strain:   . Difficulty of Paying Living Expenses: Not on file  Food Insecurity:   . Worried About Charity fundraiser in the Last Year: Not on file  . Ran Out of Food in the Last Year: Not on file  Transportation Needs:   . Lack of Transportation (Medical): Not on file  . Lack of Transportation (Non-Medical): Not on file  Physical Activity:   . Days of Exercise per Week: Not on file  . Minutes of Exercise per Session: Not on file  Stress:   . Feeling of Stress : Not on file  Social Connections:   . Frequency of Communication with Friends and Family: Not on file  . Frequency of Social Gatherings with Friends and Family: Not on file  . Attends Religious Services: Not on file  . Active Member of Clubs or Organizations: Not on file  . Attends Archivist Meetings: Not on file  . Marital Status: Not on file  Intimate Partner Violence:   . Fear of Current or Ex-Partner: Not on file  . Emotionally Abused: Not on file  . Physically Abused: Not on file  . Sexually Abused: Not on file   Family History  Problem Relation Age of Onset  . Lung cancer Mother 74       smoker  . Heart disease Mother   . Stroke Father 70  . Heart attack Father   . Allergic rhinitis Sister   . Hypertension Sister   . CVA Brother   . Lung cancer Maternal Grandfather   . Heart attack Paternal Grandfather   . Psoriasis Daughter   . Rheum arthritis Daughter   . Bipolar disorder Daughter   . Colon cancer Neg Hx   . Esophageal cancer Neg Hx   . Inflammatory  bowel disease Neg Hx   . Liver disease Neg Hx   . Pancreatic cancer Neg Hx   . Stomach cancer Neg Hx   . Rectal cancer Neg Hx    I have reviewed her medical, social, and family history in detail and updated the electronic medical record as necessary.    PHYSICAL EXAMINATION  BP (!) 150/82   Pulse 72   Ht 5\' 3"  (1.6 m)   Wt 159 lb (72.1 kg)   SpO2 95%   BMI 28.17 kg/m  Wt Readings from Last 3 Encounters:  11/23/19 159 lb (72.1 kg)  08/24/19 157 lb 9.6 oz (71.5 kg)  08/15/19 154 lb 6.4 oz (70 kg)  GEN: NAD, appears stated age, doesn't appear chronically ill PSYCH: Cooperative, without pressured speech EYE: Conjunctivae pink, sclerae anicteric ENT: MMM CV: Non-tachycardic RESP: No audible wheezing GI: NABS, soft, NT/ND, without rebound or guarding MSK/EXT: No lower extremity edema SKIN: No jaundice NEURO:  Alert & Oriented x 3, no focal deficits   REVIEW OF DATA  I reviewed the following data at the time of this encounter:  GI Procedures and Studies  Previously reviewed  Laboratory Studies  Reviewed those in epic  Imaging Studies  No relevant studies to review   ASSESSMENT  Ms. Lye is a 86 y.o. female with a pmh significant for CVA (on Plavix), atrial fibrillation, anxiety/depression, sleep apnea, colon polyps (unknown pathology), status post appendectomy, seborrheic keratoses (seen dermatology in the past), chronic sore throat, esophagitis, hiatal hernia, diverticulosis, hemorrhoids, colonic AVM, adenomatous colon polyps.  The patient is seen today for evaluation and management of:  1. Gastroesophageal reflux disease, unspecified whether esophagitis present   2. Dysphagia, unspecified type   3. Pyrosis   4. Hiatal hernia   5. Chronic cough   6. Other dysphagia    The patient is hemodynamically stable.  Unfortunately by decreasing her PPI to once daily it looks like she has had recurrence of some of her symptoms.  She has not been taking the PPI at appropriate  times (she was taking right before bedtime).  I have asked her to reinitiate twice daily PPI and take it 30 minutes before breakfast and 30 minutes before dinner.  Hopefully this will help with acid secretion overall.  At this point we will move forward with the planned repeat endoscopy with dilation.  If symptoms of dysphagia did not improve after evaluation then esophageal manometry and pH impedance testing to further evaluate the patient in regards to her chronic symptoms and evaluate whether she may have extra esophageal symptoms leading to her chronic cough should be strongly considered.  The risks and benefits of endoscopic evaluation were discussed with the patient; these include but are not limited to the risk of perforation, infection, bleeding, missed lesions, lack of diagnosis, severe illness requiring hospitalization, as well as anesthesia and sedation related illnesses.  The patient is agreeable to proceed.  All patient questions were answered to the best of my ability, and the patient agrees to the aforementioned plan of action with follow-up as indicated.   PLAN  Proceed with rescheduling EGD to evaluate esophageal healing and dilation Increase omeprazole to 40 mg twice daily (take 30 minutes before breakfast and dinner) If symptoms of dysphagia and/or extra esophageal cough symptoms persist will consider pH impedance and manometry   Orders Placed This Encounter  Procedures  . Ambulatory referral to Gastroenterology    New Prescriptions   OMEPRAZOLE (PRILOSEC) 40 MG CAPSULE    Take 1 capsule (40 mg total) by mouth 2 (two) times daily before a meal.   Modified Medications   No medications on file    Planned Follow Up No follow-ups on file.   Total Time in Face-to-Face and in Coordination of Care for patient including independent/personal interpretation/review of prior testing, medical history, examination, medication adjustment, communicating results with the patient directly,  and documentation with the EHR is 30 minutes.  Minutes.   Justice Britain, MD [facility] Advanced Endoscopy Office # [phone]

## 2019-11-26 ENCOUNTER — Ambulatory Visit: Payer: Medicare Other | Admitting: Family Medicine

## 2019-11-26 ENCOUNTER — Encounter: Payer: Self-pay | Admitting: Family Medicine

## 2019-11-26 ENCOUNTER — Other Ambulatory Visit: Payer: Self-pay

## 2019-11-26 ENCOUNTER — Ambulatory Visit: Payer: Medicare Other | Admitting: Internal Medicine

## 2019-11-26 VITALS — BP 121/80 | HR 74 | Temp 98.8°F | Ht 63.0 in | Wt 160.6 lb

## 2019-11-26 DIAGNOSIS — J302 Other seasonal allergic rhinitis: Secondary | ICD-10-CM

## 2019-11-26 DIAGNOSIS — F411 Generalized anxiety disorder: Secondary | ICD-10-CM

## 2019-11-26 DIAGNOSIS — J3089 Other allergic rhinitis: Secondary | ICD-10-CM

## 2019-11-26 DIAGNOSIS — E782 Mixed hyperlipidemia: Secondary | ICD-10-CM | POA: Diagnosis not present

## 2019-11-26 MED ORDER — MONTELUKAST SODIUM 10 MG PO TABS: 10.0000 mg | ORAL_TABLET | Freq: Every day | ORAL | 1 refills | Status: DC

## 2019-11-26 MED ORDER — DIAZEPAM 5 MG PO TABS
2.5000 mg | ORAL_TABLET | ORAL | 0 refills | Status: DC | PRN
Start: 2019-11-26 — End: 2020-10-29

## 2019-11-26 NOTE — Patient Instructions (Addendum)
-   come back for fasting labs. Just call and make an appointment.   -wait 2 weeks for flu shot after booster  -starting you singulair to see if helps with allergies  -refilling valium for you. Please let me know if you feel like anxiety is getting out of control.   -will see you back in 9 months or sooner if needed!   Enjoy your holiday! Dr. Rogers Blocker

## 2019-11-26 NOTE — Progress Notes (Signed)
Patient: Darlene [name] MRN: [id number] DOB: [birth year] PCP: Orma Flaming, MD     Subjective:  Chief Complaint  Patient presents with  . Hyperlipidemia  . right leg swelling  . GAD  . Allergies    HPI: The patient is a 86 y.o. female who presents today for 3 month follow up for right foot swelling. When I saw her 3 months ago, we were not able to address all of her chronic health issues so I had her follow up today. We will address her anxiety today, f/u of right foot swelling, hyperlipidemia. She again is talking about her visits to specialists.   Right foot swelling I saw her three months ago for this complaints. Labs work up was unremarkable. And physical exam was not very impressive. Recommended compression socks/hose and elevating legs. She states this has resolved.   GAD Currently on valium 2.5mg  prn. Last filled 11/30/2018. She has had a lot of health issues recently and may be exacerbating her anxiety. She has a therapist that she sees on a regular basis and tries to stay away from medication. She states she needs a refill as this has expired. She will use very sparingly. E.g. middle of the night. She is happy off medication.   Hyperlipidemia Currently on pravachol 40mg /day. History of CVA. Due for lipid panel. Last one I can find is from a year ago. LDL of 119. She is not fasting today.   Chronic allergies/pnd She states she has seen allergist when she moved back here. She states when she was gone for 3 weeks she had no issues. She is already on astelin and flonase spray. Can't tolerate anti-histamines. Not sure if she has been on singulair.   She is frustrated that she can not lose weight. She is not really exercising. She can't stand the heat here in the summer.   Has had covid vaccines. Discussed booster   Review of Systems  Constitutional: Negative for fatigue and fever.  HENT: Positive for postnasal drip and sore throat.   Respiratory: Negative for chest tightness  and shortness of breath.   Cardiovascular: Negative for chest pain, palpitations and leg swelling.  Gastrointestinal: Negative for abdominal pain and diarrhea.  Neurological: Negative for dizziness and headaches.  Psychiatric/Behavioral: Positive for sleep disturbance. Negative for suicidal ideas. The patient is not nervous/anxious.     Allergies Patient is allergic to norco [hydrocodone-acetaminophen], dust mite extract, oxycontin [oxycodone hcl], and pollen extract.  Past Medical History Patient  has a past medical history of Anxiety, Asthma (04/06/2018), Colon polyps, Depression, Hyperlipidemia, Hypertension, Hypothyroidism, Skin cancer, Sleep apnea, and Stroke ([place]) (2008).  Surgical History Patient  has a past surgical history that includes Lumbar disc surgery (2014); Appendectomy (1960); Tonsillectomy (1940); Adenoidectomy; Mesh applied to lap port; Colonoscopy; Gum surgery (03/2019); and Melanoma excision (2020).  Family History Pateint's family history includes Allergic rhinitis in her sister; Bipolar disorder in her daughter; CVA in her brother; Heart attack in her father and paternal grandfather; Heart disease in her mother; Hypertension in her sister; Lung cancer in her maternal grandfather; Lung cancer (age of onset: 10) in her mother; Psoriasis in her daughter; Rheum arthritis in her daughter; Stroke (age of onset: 59) in her father.  Social History Patient  reports that she has never smoked. She has never used smokeless tobacco. She reports current alcohol use. She reports that she does not use drugs.    Objective: Vitals:   11/26/19 0930  BP: 121/80  Pulse: 74  Temp: 98.8  F (37.1 C)  TempSrc: Temporal  SpO2: 97%  Weight: 160 lb 9.6 oz (72.8 kg)  Height: 5\' 3"  (1.6 m)    Body mass index is 28.45 kg/m.  Physical Exam Vitals reviewed.  Constitutional:      Appearance: Normal appearance. She is well-developed. She is obese.  HENT:     Head: Normocephalic and  atraumatic.     Right Ear: External ear normal.     Left Ear: External ear normal.  Eyes:     Conjunctiva/sclera: Conjunctivae normal.     Pupils: Pupils are equal, round, and reactive to light.  Neck:     Thyroid: No thyromegaly.     Vascular: No carotid bruit.  Cardiovascular:     Rate and Rhythm: Normal rate and regular rhythm.     Pulses: Normal pulses.     Heart sounds: Normal heart sounds. No murmur heard.   Pulmonary:     Effort: Pulmonary effort is normal.     Breath sounds: Normal breath sounds.  Abdominal:     General: Bowel sounds are normal. There is no distension.     Palpations: Abdomen is soft.     Tenderness: There is no abdominal tenderness.  Musculoskeletal:     Cervical back: Normal range of motion and neck supple.  Lymphadenopathy:     Cervical: No cervical adenopathy.  Skin:    General: Skin is warm and dry.     Capillary Refill: Capillary refill takes less than 2 seconds.     Findings: No rash.  Neurological:     General: No focal deficit present.     Mental Status: She is alert and oriented to person, place, and time.     Cranial Nerves: No cranial nerve deficit.     Coordination: Coordination normal.     Deep Tendon Reflexes: Reflexes normal.  Psychiatric:        Mood and Affect: Mood normal.        Behavior: Behavior normal.         GAD 7 : Generalized Anxiety Score 11/26/2019  Nervous, Anxious, on Edge 2  Control/stop worrying 2  Worry too much - different things 3  Trouble relaxing 2  Restless 0  Easily annoyed or irritable 2  Afraid - awful might happen 0  Total GAD 7 Score 11  Anxiety Difficulty Very difficult     Assessment/plan: 1. GAD (generalized anxiety disorder) GAD 7 is moderate anxiety. She is counseling and feels well controlled. Desires no medication and will let me know if she feels like she is not coping well with therapy alone. Refilled her valium that she uses on a very prn basis. Last times filled was one year ago  with #30.   2. Mixed hyperlipidemia She will come back for fasting labs. Continue statin with CVA history.   3. Seasonal allergies -already on astelin and flonase. Trial of singualir. Declines allergy shots at this time.   4. Right foot swelling -resolved. Continue compression socks prn.   This visit occurred during the SARS-CoV-2 public health emergency.  Safety protocols were in place, including screening questions prior to the visit, additional usage of staff PPE, and extensive cleaning of exam room while observing appropriate contact time as indicated for disinfecting solutions.     Return in about 9 months (around 08/25/2020) for routine f/u wiht labs (thyroid) .   Orma Flaming, MD [place]   11/26/2019

## 2019-11-27 ENCOUNTER — Telehealth: Payer: Self-pay

## 2019-11-27 NOTE — Telephone Encounter (Signed)
Hold plavix for 5 days. Proceed with procedure with low risk from cardiac standpoint. Restart Plavix same day if no biopsy otherwise 4-5 days post procedure.  ? Adrian Prows, MD, [facility] 11/27/2019, 10:58 AM Office: [phone] Pager: [phone]

## 2019-11-27 NOTE — Telephone Encounter (Signed)
Request for surgical clearance:     Endoscopy Procedure  What type of surgery is being performed?     Endoscopy   When is this surgery scheduled?     12/26/19  What type of clearance is required ?   Pharmacy  Are there any medications that need to be held prior to surgery and how long? Plavix x5 days prior to procedure   Practice name and name of physician performing surgery?      [facility]-Dr. Rush Landmark Helmut Muster   What is your office phone and fax number?      Phone- [phone]  [phone]  Anesthesia type (None, local, MAC, general) ?       MAC

## 2019-11-29 ENCOUNTER — Non-Acute Institutional Stay: Payer: Medicare Other | Admitting: Nurse Practitioner

## 2019-11-29 ENCOUNTER — Other Ambulatory Visit: Payer: Self-pay

## 2019-11-29 ENCOUNTER — Encounter: Payer: Self-pay | Admitting: Nurse Practitioner

## 2019-11-29 DIAGNOSIS — I10 Essential (primary) hypertension: Secondary | ICD-10-CM

## 2019-11-29 DIAGNOSIS — E039 Hypothyroidism, unspecified: Secondary | ICD-10-CM | POA: Diagnosis not present

## 2019-11-29 DIAGNOSIS — R32 Unspecified urinary incontinence: Secondary | ICD-10-CM

## 2019-11-29 DIAGNOSIS — Z8673 Personal history of transient ischemic attack (TIA), and cerebral infarction without residual deficits: Secondary | ICD-10-CM

## 2019-11-29 DIAGNOSIS — J302 Other seasonal allergic rhinitis: Secondary | ICD-10-CM

## 2019-11-29 DIAGNOSIS — K219 Gastro-esophageal reflux disease without esophagitis: Secondary | ICD-10-CM

## 2019-11-29 DIAGNOSIS — F411 Generalized anxiety disorder: Secondary | ICD-10-CM

## 2019-11-29 DIAGNOSIS — J3089 Other allergic rhinitis: Secondary | ICD-10-CM

## 2019-11-29 DIAGNOSIS — M5432 Sciatica, left side: Secondary | ICD-10-CM

## 2019-11-29 DIAGNOSIS — T50B95A Adverse effect of other viral vaccines, initial encounter: Secondary | ICD-10-CM | POA: Insufficient documentation

## 2019-11-29 NOTE — Assessment & Plan Note (Signed)
HTN, blood pressure is controlled on Losartan qd.

## 2019-11-29 NOTE — Assessment & Plan Note (Signed)
Anxiety, persisted early am awake, difficulty return to sleep, then take a valium, feels exhausted in morning, failed Buspar 5mg  qd,  Sertraline 25mg  qd 03/01/19 which caused diarrhea.

## 2019-11-29 NOTE — Progress Notes (Signed)
Location:      Place of Service:  Clinic (12) Provider: Marlana Latus NP  Code Status: DNR Goals of Care:  Advanced Directives 04/07/2019  Does Patient Have a Medical Advance Directive? No  Type of Advance Directive -  Does patient want to make changes to medical advance directive? -  Would patient like information on creating a medical advance directive? No - Patient declined     Chief Complaint  Patient presents with  . Medical Management of Chronic Issues    Patient returns to the clinic for follow up. She had her covid booster shot 3 days ago. She will get 2nd shingles vaccine soon.   Marland Kitchen Health Maintenance    Dexa scan and TDAP due.    HPI: Patient is a 86 y.o. female seen today for medical management of chronic diseases.    ED eval 02/20/19 midline low back pain, down to the left leg with numbness, MRI mild nerve root impingement of L5, neurology consult, may cause sciatica, course of steroids completed. UA/CXR unremarkable. Much improved             Hx of chronic rhinitis, on Astelin, Flonase, trial  of Singulair  Anxiety, persisted early am awake, difficulty return to sleep, then take a valium, feels exhausted in morning, failed Buspar 5mg  qd,  Sertraline 25mg  qd 03/01/19 which caused diarrhea.    Hx of CVA, on plavix. Statin   Hypothyroidism, on Levothyroxine 135mcg qd. Scheduled f/u family physician 08/25/2020 for f/u TSH. TSH 0.83 07/26/19  HTN, blood pressure is controlled on Losartan qd.   GERD, stable Omeprazole 40mg  qd.   Past Medical History:  Diagnosis Date  . Anxiety   . Asthma 04/06/2018  . Colon polyps   . Depression   . Hyperlipidemia   . Hypertension   . Hypothyroidism   . Skin cancer   . Sleep apnea   . Stroke [facility] 2008    Past Surgical History:  Procedure Laterality Date  . ADENOIDECTOMY    . APPENDECTOMY  1960  . COLONOSCOPY    . GUM SURGERY  03/2019  . [place] SURGERY  2014  . MELANOMA EXCISION  2020  . MESH APPLIED TO LAP PORT    .  TONSILLECTOMY  1940    Allergies  Allergen Reactions  . Norco [Hydrocodone-Acetaminophen] Nausea And Vomiting  . Dust Mite Extract   . Oxycontin [Oxycodone Hcl] Nausea And Vomiting  . Pollen Extract     Allergies as of 11/29/2019      Reactions   Norco [hydrocodone-acetaminophen] Nausea And Vomiting   Dust Mite Extract    Oxycontin [oxycodone Hcl] Nausea And Vomiting   Pollen Extract       Medication List       Accurate as of November 29, 2019 11:59 PM. If you have any questions, ask your nurse or doctor.        STOP taking these medications   predniSONE 10 MG tablet Commonly known as: DELTASONE Stopped by: Cornisha Zetino X Remmy Riffe, NP     TAKE these medications   acetaminophen 325 MG tablet Commonly known as: TYLENOL Take 325-650 mg by mouth every 6 (six) hours as needed for moderate pain (for sinus headache).   azelastine 0.1 % nasal spray Commonly known as: ASTELIN Place 2 sprays into both nostrils 2 (two) times daily.   clopidogrel 75 MG tablet Commonly known as: PLAVIX TAKE 1 TABLET BY MOUTH EVERY DAY   diazepam 5 MG tablet Commonly known as:  VALIUM Take 0.5 tablets (2.5 mg total) by mouth as needed (anxiety).   fluticasone 50 MCG/ACT nasal spray Commonly known as: FLONASE Place 2 sprays into both nostrils in the morning and at bedtime.   losartan 50 MG tablet Commonly known as: COZAAR TAKE 1 TABLET BY MOUTH EVERY DAY   montelukast 10 MG tablet Commonly known as: SINGULAIR Take 1 tablet (10 mg total) by mouth at bedtime.   omeprazole 40 MG capsule Commonly known as: PRILOSEC Take 1 capsule (40 mg total) by mouth 2 (two) times daily before a meal.   pravastatin 40 MG tablet Commonly known as: PRAVACHOL Take 1 tablet (40 mg total) by mouth at bedtime.   sodium chloride 0.65 % Soln nasal spray Commonly known as: OCEAN Place 1 spray into both nostrils as needed for congestion.   Synthroid 100 MCG tablet Generic drug: levothyroxine TAKE 1 TABLET BY MOUTH  DAILY BEFORE BREAKFAST.   Zolpidem Tartrate 3.5 MG Subl PLACE 3.5 MG UNDER THE TONGUE AT BEDTIME AS NEEDED. What changed: reasons to take this       Review of Systems:  Review of Systems  Constitutional: Positive for unexpected weight change. Negative for fatigue and fever.  HENT: Positive for congestion, hearing loss and sinus pressure. Negative for voice change.        Chronic sinusitis, underwent ENT, sleep study-sleep apnea.   Eyes: Positive for visual disturbance.       Left lateral visual field deficit  Respiratory: Negative for cough and shortness of breath.   Cardiovascular: Negative for leg swelling.  Gastrointestinal: Positive for diarrhea and nausea. Negative for abdominal pain and constipation.  Genitourinary: Negative for difficulty urinating, dysuria and urgency.       Urinary leakage. Doing Kegel exercise.   Musculoskeletal: Negative for back pain and gait problem.  Skin: Negative for color change.  Neurological: Negative for speech difficulty, weakness and headaches.  Psychiatric/Behavioral: Positive for sleep disturbance. Negative for dysphoric mood. The patient is not nervous/anxious.        Early am awake, difficulty returning asleep.     Health Maintenance  Topic Date Due  . DEXA SCAN  Never done  . TETANUS/TDAP  11/26/2018  . INFLUENZA VACCINE  04/17/2020 (Originally 08/19/2019)  . COLONOSCOPY  10/25/2021  . COVID-19 Vaccine  Completed  . PNA vac Low Risk Adult  Completed    Physical Exam: Vitals:   11/29/19 1405  BP: 132/80  Pulse: 74  Temp: 98 F (36.7 C)  SpO2: 96%  Weight: 159 lb 6.4 oz (72.3 kg)  Height: 5\' 3"  (1.6 m)   Body mass index is 28.24 kg/m. Physical Exam Vitals and nursing note reviewed.  Constitutional:      Appearance: Normal appearance.  HENT:     Head: Normocephalic and atraumatic.     Nose: Nose normal.     Mouth/Throat:     Mouth: Mucous membranes are moist.  Eyes:     Extraocular Movements: Extraocular movements  intact.     Conjunctiva/sclera: Conjunctivae normal.     Pupils: Pupils are equal, round, and reactive to light.     Comments: Left peripheral visual field deficit since CVA 2006  Cardiovascular:     Rate and Rhythm: Normal rate and regular rhythm.     Heart sounds: No murmur heard.   Pulmonary:     Effort: Pulmonary effort is normal.     Breath sounds: No rales.  Abdominal:     General: Bowel sounds are normal.  Palpations: Abdomen is soft.     Tenderness: There is no abdominal tenderness.  Musculoskeletal:     Cervical back: Normal range of motion and neck supple.     Right lower leg: No edema.     Left lower leg: No edema.  Skin:    General: Skin is warm and dry.     Findings: Erythema present.     Comments: The right upper arm inj site redness, warmth, tenderness, swelling is localized.   Neurological:     General: No focal deficit present.     Mental Status: She is alert and oriented to person, place, and time. Mental status is at baseline.     Gait: Gait normal.  Psychiatric:        Mood and Affect: Mood normal.        Behavior: Behavior normal.        Thought Content: Thought content normal.        Judgment: Judgment normal.     Labs reviewed: Basic Metabolic Panel: Recent Labs    12/05/18 0700 12/05/18 0700 02/20/19 1729 04/07/19 2048 07/26/19 1041 08/24/19 1540 09/20/19 1944 09/20/19 1956  NA 144   < >   < > 140  --  144 142 143  K 4.1   < >   < > 3.8  --  4.6 3.7 4.3  CL 109   < >   < > 107  --  108 106 108  CO2 27  --    < > 23  --  27 26  --   GLUCOSE 104*   < >   < > 122*  --  91 130* 126*  BUN 14   < >   < > 12  --  16 16 15   CREATININE 0.78   < >   < > 0.80  --  0.80 0.71 0.70  CALCIUM 9.6  --    < > 9.6  --  10.0 9.4  --   TSH 1.02  --   --   --  0.74  --   --   --    < > = values in this interval not displayed.   Liver Function Tests: Recent Labs    02/20/19 1729 02/20/19 1729 04/03/19 2126 08/24/19 1540 09/20/19 1944  AST 17   < >  26 16 19   ALT 17   < > 19 16 21   ALKPHOS 72  --  73  --  80  BILITOT 0.7   < > 1.0 0.4 0.4  PROT 6.9   < > 7.0 6.8 7.3  ALBUMIN 3.9  --  3.7  --  4.4   < > = values in this interval not displayed.   Recent Labs    04/03/19 2126  LIPASE 30   No results for input(s): AMMONIA in the last 8760 hours. CBC: Recent Labs    04/07/19 2048 04/07/19 2048 08/24/19 1540 09/20/19 1944 09/20/19 1956  WBC 9.3  --  6.7 7.4  --   NEUTROABS 5.7  --  3,317 3.7  --   HGB 14.0   < > 14.3 14.2 14.6  HCT 41.8   < > 44.8 43.4 43.0  MCV 88.7  --  90.5 90.4  --   PLT 329  --  264 245  --    < > = values in this interval not displayed.   Lipid Panel: Recent Labs    12/05/18 0700  CHOL 191  HDL 48*  LDLCALC 119*  TRIG 127  CHOLHDL 4.0   No results found for: HGBA1C  Procedures since last visit: No results found.  Assessment/Plan  Gastroesophageal reflux disease GERD, stable Omeprazole 40mg  qd.    Essential hypertension HTN, blood pressure is controlled on Losartan qd.  Acquired hypothyroidism Hypothyroidism, on Levothyroxine 185mcg qd. Scheduled f/u family physician 08/25/2020 for f/u TSH. TSH 0.83 07/26/19   History of CVA (cerebrovascular accident) Hx of CVA, on plavix. Statin. Hx of poor left peripheral vision from Hx of CVA: 15 years ago    GAD (generalized anxiety disorder) Anxiety, persisted early am awake, difficulty return to sleep, then take a valium, feels exhausted in morning, failed Buspar 5mg  qd,  Sertraline 25mg  qd 03/01/19 which caused diarrhea.     Seasonal and perennial allergic rhinitis Hx of chronic rhinitis, on Astelin, Flonase, trial  of Singulair   Back pain with left-sided sciatica ED eval 02/20/19 midline low back pain, down to the left leg with numbness, MRI mild nerve root impingement of L5, neurology consult, may cause sciatica, course of steroids completed. UA/CXR unremarkable. Much improved   Adverse reaction to COVID-19 vaccine Right upper arm inj  site redness, sore, warmth, localized. Better with fatigue, nausea, feverish, and diarrhea.   Incontinent of urine See urology   Labs/tests ordered:  * No order type specified * Next appt:  Visit date not found

## 2019-11-29 NOTE — Assessment & Plan Note (Signed)
ED eval 02/20/19 midline low back pain, down to the left leg with numbness, MRI mild nerve root impingement of L5, neurology consult, may cause sciatica, course of steroids completed. UA/CXR unremarkable. Much improved

## 2019-11-29 NOTE — Assessment & Plan Note (Signed)
Hx of chronic rhinitis, on Astelin, Flonase, trial  of Singulair

## 2019-11-29 NOTE — Assessment & Plan Note (Signed)
Right upper arm inj site redness, sore, warmth, localized. Better with fatigue, nausea, feverish, and diarrhea.

## 2019-11-29 NOTE — Assessment & Plan Note (Signed)
Hypothyroidism, on Levothyroxine 186mcg qd. Scheduled f/u family physician 08/25/2020 for f/u TSH. TSH 0.83 07/26/19

## 2019-11-29 NOTE — Assessment & Plan Note (Addendum)
Hx of CVA, on plavix. Statin. Hx of poor left peripheral vision from Hx of CVA: 15 years ago

## 2019-11-29 NOTE — Assessment & Plan Note (Signed)
GERD, stable Omeprazole 40mg  qd.

## 2019-11-29 NOTE — Assessment & Plan Note (Signed)
See urology.

## 2019-11-30 ENCOUNTER — Encounter: Payer: Self-pay | Admitting: Nurse Practitioner

## 2019-12-11 ENCOUNTER — Ambulatory Visit: Payer: Medicare Other | Admitting: Dermatology

## 2019-12-12 NOTE — Telephone Encounter (Signed)
Pt informed okay to hold plavix x5 days before procedure on 12/26/19.

## 2019-12-16 ENCOUNTER — Other Ambulatory Visit: Payer: Self-pay | Admitting: Internal Medicine

## 2019-12-17 NOTE — Telephone Encounter (Signed)
Will send rx to [name] to confirm if this is a [employer] [facility] patient for the PCP is listed as Dr.Wolfe

## 2019-12-20 ENCOUNTER — Telehealth: Payer: Self-pay | Admitting: Family Medicine

## 2019-12-20 NOTE — Telephone Encounter (Signed)
Left message for patient to call back and schedule Medicare Annual Wellness Visit (AWV) either virtually OR in office.   No hx; please schedule at anytime with [facility]-Nurse Health Advisor at [facility].  This should be a 45 minute visit.  AWV-I PER [facility] AS OF 01/18/2009

## 2019-12-24 ENCOUNTER — Other Ambulatory Visit (INDEPENDENT_AMBULATORY_CARE_PROVIDER_SITE_OTHER): Payer: Self-pay | Admitting: Otolaryngology

## 2019-12-25 ENCOUNTER — Other Ambulatory Visit: Payer: Self-pay | Admitting: Nurse Practitioner

## 2019-12-25 DIAGNOSIS — K219 Gastro-esophageal reflux disease without esophagitis: Secondary | ICD-10-CM

## 2019-12-26 ENCOUNTER — Encounter: Payer: Self-pay | Admitting: Gastroenterology

## 2019-12-26 ENCOUNTER — Other Ambulatory Visit: Payer: Self-pay

## 2019-12-26 ENCOUNTER — Ambulatory Visit (AMBULATORY_SURGERY_CENTER): Payer: Medicare Other | Admitting: Gastroenterology

## 2019-12-26 ENCOUNTER — Other Ambulatory Visit: Payer: Medicare Other

## 2019-12-26 VITALS — BP 169/73 | HR 59 | Temp 97.6°F | Resp 14 | Ht 63.0 in | Wt 159.0 lb

## 2019-12-26 DIAGNOSIS — R1310 Dysphagia, unspecified: Secondary | ICD-10-CM | POA: Diagnosis not present

## 2019-12-26 DIAGNOSIS — R053 Chronic cough: Secondary | ICD-10-CM

## 2019-12-26 DIAGNOSIS — K219 Gastro-esophageal reflux disease without esophagitis: Secondary | ICD-10-CM

## 2019-12-26 DIAGNOSIS — K449 Diaphragmatic hernia without obstruction or gangrene: Secondary | ICD-10-CM

## 2019-12-26 DIAGNOSIS — E782 Mixed hyperlipidemia: Secondary | ICD-10-CM

## 2019-12-26 LAB — COMPLETE METABOLIC PANEL WITH GFR
AG Ratio: 1.7 (calc) (ref 1.0–2.5)
ALT: 13 U/L (ref 6–29)
AST: 16 U/L (ref 10–35)
Albumin: 4.4 g/dL (ref 3.6–5.1)
Alkaline phosphatase (APISO): 79 U/L (ref 37–153)
BUN: 14 mg/dL (ref 7–25)
CO2: 31 mmol/L (ref 20–32)
Calcium: 10 mg/dL (ref 8.6–10.4)
Chloride: 105 mmol/L (ref 98–110)
Creat: 0.8 mg/dL (ref 0.60–0.88)
GFR, Est African American: 78 mL/min/{1.73_m2} (ref >=60)
GFR, Est Non African American: 68 mL/min/{1.73_m2} (ref >=60)
Globulin: 2.6 g/dL (calc) (ref 1.9–3.7)
Glucose, Bld: 114 mg/dL — ABNORMAL HIGH (ref 65–99)
Potassium: 4.3 mmol/L (ref 3.5–5.3)
Sodium: 142 mmol/L (ref 135–146)
Total Bilirubin: 0.5 mg/dL (ref 0.2–1.2)
Total Protein: 7 g/dL (ref 6.1–8.1)

## 2019-12-26 LAB — CBC WITH DIFFERENTIAL/PLATELET
Absolute Monocytes: 449 cells/uL (ref 200–950)
Basophils Absolute: 59 cells/uL (ref 0–200)
Basophils Relative: 0.9 %
Eosinophils Absolute: 208 cells/uL (ref 15–500)
Eosinophils Relative: 3.2 %
HCT: 43.3 % (ref 35.0–45.0)
Hemoglobin: 14.5 g/dL (ref 11.7–15.5)
Lymphs Abs: 2873 cells/uL (ref 850–3900)
MCH: 29.8 pg (ref 27.0–33.0)
MCHC: 33.5 g/dL (ref 32.0–36.0)
MCV: 88.9 fL (ref 80.0–100.0)
MPV: 11.4 fL (ref 7.5–12.5)
Monocytes Relative: 6.9 %
Neutro Abs: 2912 cells/uL (ref 1500–7800)
Neutrophils Relative %: 44.8 %
Platelets: 263 10*3/uL (ref 140–400)
RBC: 4.87 10*6/uL (ref 3.80–5.10)
RDW: 12.1 % (ref 11.0–15.0)
Total Lymphocyte: 44.2 %
WBC: 6.5 10*3/uL (ref 3.8–10.8)

## 2019-12-26 LAB — LIPID PANEL
Cholesterol: 180 mg/dL (ref <200)
HDL: 51 mg/dL (ref >=50)
LDL Cholesterol (Calc): 107 mg/dL (calc) — ABNORMAL HIGH
Non-HDL Cholesterol (Calc): 129 mg/dL (calc) (ref <130)
Total CHOL/HDL Ratio: 3.5 (calc) (ref <5.0)
Triglycerides: 123 mg/dL (ref <150)

## 2019-12-26 MED ORDER — SODIUM CHLORIDE 0.9 % IV SOLN: 500.0000 mL | Freq: Once | INTRAVENOUS | Status: DC

## 2019-12-26 NOTE — Progress Notes (Signed)
Called to room to assist during endoscopic procedure.  Patient ID and intended procedure confirmed with present staff. Received instructions for my participation in the procedure from the performing physician.  

## 2019-12-26 NOTE — Progress Notes (Signed)
A/ox3, pleased with MAC, report to RN 

## 2019-12-26 NOTE — Progress Notes (Signed)
Pt's states no medical or surgical changes since previsit or office visit. 

## 2019-12-26 NOTE — Op Note (Addendum)
[place] Patient Name: Darlene [name] Procedure Date: 12/26/2019 4:33 PM MRN: [id number] Endoscopist: Justice Britain , MD Age: 86 Referring MD:  Date of Birth: 1933/08/23 Gender: Female Account #: [id number] Procedure:                Upper GI endoscopy Indications:              Dysphagia, Chronic cough Medicines:                Monitored Anesthesia Care Procedure:                Pre-Anesthesia Assessment:                           - Prior to the procedure, a History and Physical                            was performed, and patient medications and                            allergies were reviewed. The patient's tolerance of                            previous anesthesia was also reviewed. The risks                            and benefits of the procedure and the sedation                            options and risks were discussed with the patient.                            All questions were answered, and informed consent                            was obtained. Prior Anticoagulants: The patient has                            taken Plavix (clopidogrel), last dose was 5 days                            prior to procedure. ASA Grade Assessment: III - A                            patient with severe systemic disease. After                            reviewing the risks and benefits, the patient was                            deemed in satisfactory condition to undergo the                            procedure.  After obtaining informed consent, the endoscope was                            passed under direct vision. Throughout the                            procedure, the patient's blood pressure, pulse, and                            oxygen saturations were monitored continuously. The                            Endoscope was introduced through the mouth, and                            advanced to the second part of duodenum. The upper                             GI endoscopy was accomplished without difficulty.                            The patient tolerated the procedure. Scope In: Scope Out: Findings:                 No gross lesions were noted in the entire                            esophagus. After the rest of the EGD was complete,                            a guidewire was placed and the scope was withdrawn.                            Dilation was performed with a Savary dilator with                            mild resistance at 16 mm and moderate resistance at                            18 mm. The dilation site was examined following                            endoscope reinsertion and showed moderate mucosal                            disruption at the region just distal to the UES,                            moderate improvement in luminal narrowing and no                            perforation.  A 5 cm hiatal hernia was present.                           No gross lesions were noted in the entire examined                            stomach.                           No gross lesions were noted in the duodenal bulb,                            in the first portion of the duodenum and in the                            second portion of the duodenum. Complications:            No immediate complications. Estimated Blood Loss:     Estimated blood loss was minimal. Impression:               - No gross lesions in esophagus. Dilated with                            evidence of mucosal wrent being present.                           - 5 cm hiatal hernia.                           - No gross lesions in the stomach.                           - No gross lesions in the duodenal bulb, in the                            first portion of the duodenum and in the second                            portion of the duodenum. Recommendation:           - The patient will be observed post-procedure,                             until all discharge criteria are met.                           - Discharge patient to home.                           - Patient has a contact number available for                            emergencies. The signs and symptoms of potential  delayed complications were discussed with the                            patient. Return to normal activities tomorrow.                            Written discharge instructions were provided to the                            patient.                           - Dilation diet as per protocol.                           - Plavix restart on 12/11 (72 hours from now) to                            decrease post-interventional bleeding.                           - Maintain 40 mg twice daily Omeprazole for now.                           - When in follow up in clinic will discuss the                            possiblity of decreasing PPI therapy.                           - Repeat upper endoscopy PRN for retreatment if                            effects are lasting in regards to dysphagia.                           - If dysphagia symptoms are persisting I suspect                            this will be hiatal hernia related, but Esophageal                            manometry testing will be strongly considered.                           - The findings and recommendations were discussed                            with the patient. Justice Britain, MD 12/26/2019 5:14:45 PM

## 2019-12-26 NOTE — Telephone Encounter (Signed)
It is going to be Dr Rogers Blocker as our Schedule does not work for her.  So I am going to refuse her Refill. Also She needs to not be scheduled for our cilinc.

## 2019-12-26 NOTE — Patient Instructions (Addendum)
Handouts were given to you on the esophageal dilatation diet to follow the rest of today and a hiatal hernia. Maintain Omeprazole twice per day for now. Repeat upper endoscopy as needed for re-treatment if effects are lasting in regards to difficulty swallowing. Restart your PLAVIX 12-29-19. You may resume your current medications today. Please call if any questions or concerns.    YOU HAD AN ENDOSCOPIC PROCEDURE TODAY AT [facility]:   Refer to the procedure report that was given to you for any specific questions about what was found during the examination.  If the procedure report does not answer your questions, please call your gastroenterologist to clarify.  If you requested that your care partner not be given the details of your procedure findings, then the procedure report has been included in a sealed envelope for you to review at your convenience later.  YOU SHOULD EXPECT: Some feelings of bloating in the abdomen. Passage of more gas than usual.  Walking can help get rid of the air that was put into your GI tract during the procedure and reduce the bloating. If you had a lower endoscopy (such as a colonoscopy or flexible sigmoidoscopy) you may notice spotting of blood in your stool or on the toilet paper. If you underwent a bowel prep for your procedure, you may not have a normal bowel movement for a few days.  Please Note:  You might notice some irritation and congestion in your nose or some drainage.  This is from the oxygen used during your procedure.  There is no need for concern and it should clear up in a day or so.  SYMPTOMS TO REPORT IMMEDIATELY:    Following upper endoscopy (EGD)  Vomiting of blood or coffee ground material  New chest pain or pain under the shoulder blades  Painful or persistently difficult swallowing  New shortness of breath  Fever of 100F or higher  Black, tarry-looking stools  For urgent or emergent issues, a gastroenterologist can be  reached at any hour by calling [phone]. Do not use MyChart messaging for urgent concerns.    DIET:  We do recommend a small meal at first, but then you may proceed to your regular diet.  Drink plenty of fluids but you should avoid alcoholic beverages for 24 hours.  ACTIVITY:  You should plan to take it easy for the rest of today and you should NOT DRIVE or use heavy machinery until tomorrow (because of the sedation medicines used during the test).    FOLLOW UP: Our staff will call the number listed on your records 48-72 hours following your procedure to check on you and address any questions or concerns that you may have regarding the information given to you following your procedure. If we do not reach you, we will leave a message.  We will attempt to reach you two times.  During this call, we will ask if you have developed any symptoms of COVID 19. If you develop any symptoms (ie: fever, flu-like symptoms, shortness of breath, cough etc.) before then, please call [phone].  If you test positive for Covid 19 in the 2 weeks post procedure, please call and report this information to [place].    If any biopsies were taken you will be contacted by phone or by letter within the next 1-3 weeks.  Please call us at [phone] if you have not heard about the biopsies in 3 weeks.    SIGNATURES/CONFIDENTIALITY: You and/or your care partner  have signed paperwork which will be entered into your electronic medical record.  These signatures attest to the fact that that the information above on your After Visit Summary has been reviewed and is understood.  Full responsibility of the confidentiality of this discharge information lies with you and/or your care-partner.

## 2019-12-26 NOTE — Progress Notes (Addendum)
Pt decided she wanted to be HIPPA when she was in the procedure room before the case began.  Landry Dyke, RN call and reported this to me.  Maw  No problems noted in the recovery room. maw

## 2019-12-26 NOTE — Telephone Encounter (Signed)
Received request to refill Ambien. Patient has recently seen Orma Flaming, MD.  Please advise who PCP is. Need to change to [facility] provider if she is ours.

## 2019-12-28 ENCOUNTER — Other Ambulatory Visit: Payer: Self-pay | Admitting: Nurse Practitioner

## 2019-12-28 ENCOUNTER — Telehealth: Payer: Self-pay

## 2019-12-28 DIAGNOSIS — K219 Gastro-esophageal reflux disease without esophagitis: Secondary | ICD-10-CM

## 2019-12-28 NOTE — Telephone Encounter (Signed)
  Follow up Call-  Call back number 12/26/2019 10/26/2018  Post procedure Call Back phone  # [phone]  Permission to leave phone message Yes Yes  Some recent data might be hidden     Patient questions:  Do you have a fever, pain , or abdominal swelling? No. Pain Score  0 *  Have you tolerated food without any problems? Yes.    Have you been able to return to your normal activities? Yes.    Do you have any questions about your discharge instructions: Diet   No. Medications  No. Follow up visit  No.  Do you have questions or concerns about your Care? No.  Actions: * If pain score is 4 or above: No action needed, pain <4.  1. Have you developed a fever since your procedure? no  2.   Have you had an respiratory symptoms (SOB or cough) since your procedure? no  3.   Have you tested positive for COVID 19 since your procedure no  4.   Have you had any family members/close contacts diagnosed with the COVID 19 since your procedure?  No   Pt stated concern about coughing during the night and increased sore throat afterward.  States she's eating and drinking well, but has a hx of sinus/allergy issues.  Advised to contact her ENT MD for tx of symptoms.  Pt agreed.   If yes to any of these questions please route to Joylene John, RN and Joella Prince, RN

## 2019-12-31 ENCOUNTER — Other Ambulatory Visit: Payer: Self-pay

## 2019-12-31 DIAGNOSIS — K219 Gastro-esophageal reflux disease without esophagitis: Secondary | ICD-10-CM

## 2019-12-31 MED ORDER — ZOLPIDEM TARTRATE 3.5 MG SL SUBL: 3.5000 mg | SUBLINGUAL_TABLET | Freq: Every evening | SUBLINGUAL | 0 refills | Status: DC | PRN

## 2020-01-03 ENCOUNTER — Encounter: Payer: Self-pay | Admitting: Family Medicine

## 2020-01-03 DIAGNOSIS — R7301 Impaired fasting glucose: Secondary | ICD-10-CM

## 2020-01-08 ENCOUNTER — Other Ambulatory Visit: Payer: Self-pay

## 2020-01-08 ENCOUNTER — Other Ambulatory Visit: Payer: Medicare Other

## 2020-01-08 DIAGNOSIS — R7301 Impaired fasting glucose: Secondary | ICD-10-CM

## 2020-01-09 ENCOUNTER — Encounter: Payer: Self-pay | Admitting: Family Medicine

## 2020-01-09 DIAGNOSIS — R7303 Prediabetes: Secondary | ICD-10-CM | POA: Insufficient documentation

## 2020-01-09 LAB — HEMOGLOBIN A1C
Hgb A1c MFr Bld: 6.2 % of total Hgb — ABNORMAL HIGH (ref <5.7)
Mean Plasma Glucose: 131 mg/dL
eAG (mmol/L): 7.3 mmol/L

## 2020-01-29 ENCOUNTER — Encounter: Payer: Self-pay | Admitting: Dermatology

## 2020-01-29 ENCOUNTER — Other Ambulatory Visit: Payer: Self-pay

## 2020-01-29 ENCOUNTER — Ambulatory Visit: Payer: Medicare Other | Admitting: Dermatology

## 2020-01-29 DIAGNOSIS — D485 Neoplasm of uncertain behavior of skin: Secondary | ICD-10-CM | POA: Diagnosis not present

## 2020-01-29 DIAGNOSIS — Z1283 Encounter for screening for malignant neoplasm of skin: Secondary | ICD-10-CM | POA: Diagnosis not present

## 2020-01-29 DIAGNOSIS — Z85820 Personal history of malignant melanoma of skin: Secondary | ICD-10-CM | POA: Diagnosis not present

## 2020-01-29 NOTE — Patient Instructions (Signed)

## 2020-02-01 ENCOUNTER — Encounter: Payer: Self-pay | Admitting: Dermatology

## 2020-02-01 ENCOUNTER — Ambulatory Visit (INDEPENDENT_AMBULATORY_CARE_PROVIDER_SITE_OTHER): Payer: Medicare Other | Admitting: Otolaryngology

## 2020-02-01 ENCOUNTER — Encounter (INDEPENDENT_AMBULATORY_CARE_PROVIDER_SITE_OTHER): Payer: Self-pay | Admitting: Otolaryngology

## 2020-02-01 ENCOUNTER — Other Ambulatory Visit: Payer: Self-pay

## 2020-02-01 VITALS — Temp 97.9°F

## 2020-02-01 DIAGNOSIS — J029 Acute pharyngitis, unspecified: Secondary | ICD-10-CM | POA: Diagnosis not present

## 2020-02-01 DIAGNOSIS — J310 Chronic rhinitis: Secondary | ICD-10-CM | POA: Diagnosis not present

## 2020-02-01 NOTE — Progress Notes (Signed)
HPI: Darlene [name] is a 87 y.o. female who returns today for evaluation of complaints of head fullness, ears feel full and dizziness.  She just recently flew from [place] and had some popping in her ears while on the flight but is complained of some fullness feeling in her ears.  She has also had a sore throat and feels very tired.  She has been using nasal steroid sprays Flonase and Nasacort.  She has had some crusting more on the right side in her nostril.  She also uses saline irrigation and sometimes gets some thick mucus from her nose when she uses saline irrigation.  Past Medical History:  Diagnosis Date  . Anxiety   . Asthma 04/06/2018  . Colon polyps   . Depression   . Hyperlipidemia   . Hypertension   . Hypothyroidism   . Skin cancer   . Sleep apnea   . Stroke [facility]) 2008   Past Surgical History:  Procedure Laterality Date  . ADENOIDECTOMY    . APPENDECTOMY  1960  . COLONOSCOPY    . GUM SURGERY  03/2019  . [place] SURGERY  2014  . MELANOMA EXCISION  2020  . MESH APPLIED TO LAP PORT    . TONSILLECTOMY  1940   Social History   Socioeconomic History  . Marital status: Widowed    Spouse name: Not on file  . Number of children: 3  . Years of education: Not on file  . Highest education level: Not on file  Occupational History  . Occupation: retired Radiographer, therapeutic  Tobacco Use  . Smoking status: Never Smoker  . Smokeless tobacco: Never Used  Vaping Use  . Vaping Use: Never used  Substance and Sexual Activity  . Alcohol use: Yes    Comment: hardly ever  . Drug use: Never  . Sexual activity: Not on file  Other Topics Concern  . Not on file  Social History Narrative   Social History      Diet? ok      Do you drink/eat things with caffeine? Weak coffee, (much milk)      Marital status?          divorced                          What year were you married? 1961      Do you live in a house, apartment, assisted living, condo, trailer, etc.?  apartment      Is it one or more stories? 4      How many persons live in your home? Only me      Do you have any pets in your home? (please list) no      Highest level of education completed? 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             yes                         Type & how often? Walk- would love to return to water exercise      Advanced Directives      Do you have a living will? yes      Do you have a DNR form?           no  If not, do you want to discuss one? no      Do you have signed POA/HPOA for forms? no      Functional Status      Do you have difficulty bathing or dressing yourself?  no      Do you have difficulty preparing food or eating? no      Do you have difficulty managing your medications? no      Do you have difficulty managing your finances?  no      Do you have difficulty affording your medications? no      Social Determinants of Health   Financial Resource Strain: Not on file  Food Insecurity: Not on file  Transportation Needs: Not on file  Physical Activity: Not on file  Stress: Not on file  Social Connections: Not on file   Family History  Problem Relation Age of Onset  . Lung cancer Mother 20       smoker  . Heart disease Mother   . Stroke Father 89  . Heart attack Father   . Allergic rhinitis Sister   . Hypertension Sister   . CVA Brother   . Lung cancer Maternal Grandfather   . Heart attack Paternal Grandfather   . Psoriasis Daughter   . Rheum arthritis Daughter   . Bipolar disorder Daughter   . Colon cancer Neg Hx   . Esophageal cancer Neg Hx   . Inflammatory bowel disease Neg Hx   . Liver disease Neg Hx   . Pancreatic cancer Neg Hx   . Stomach cancer Neg Hx   . Rectal cancer Neg Hx    Allergies  Allergen Reactions  . Norco [Hydrocodone-Acetaminophen] Nausea And Vomiting  . Dust Mite Extract   . Oxycontin [Oxycodone Hcl]  Nausea And Vomiting  . Pollen Extract    Prior to Admission medications   Medication Sig Start Date End Date Taking? Authorizing Provider  acetaminophen (TYLENOL) 325 MG tablet Take 325-650 mg by mouth every 6 (six) hours as needed for moderate pain (for sinus headache).    [provider]  azelastine (ASTELIN) 0.1 % nasal spray Place 2 sprays into both nostrils 2 (two) times daily. 01/31/18   Valentina Shaggy, MD  clopidogrel (PLAVIX) 75 MG tablet TAKE 1 TABLET BY MOUTH EVERY DAY 11/20/19   Virgie Dad, MD  diazepam (VALIUM) 5 MG tablet Take 0.5 tablets (2.5 mg total) by mouth as needed (anxiety). 11/26/19   Orma Flaming, MD  fluticasone [facility]) 50 MCG/ACT nasal spray SPRAY 2 SPRAYS IN EACH NOSTRIL AT BEDTIME 12/25/19   Rozetta Nunnery, MD  losartan (COZAAR) 50 MG tablet TAKE 1 TABLET BY MOUTH EVERY DAY 12/17/19   [name], [name], NP  montelukast (SINGULAIR) 10 MG tablet Take 1 tablet (10 mg total) by mouth at bedtime. 11/26/19   Orma Flaming, MD  omeprazole (PRILOSEC) 40 MG capsule Take 1 capsule (40 mg total) by mouth 2 (two) times daily before a meal. 11/23/19   Mansouraty, Telford Nab., MD  pravastatin (PRAVACHOL) 40 MG tablet Take 1 tablet (40 mg total) by mouth at bedtime. 09/26/19   Adrian Prows, MD  sodium chloride (OCEAN) 0.65 % SOLN nasal spray Place 1 spray into both nostrils as needed for congestion.    [provider]  SYNTHROID 100 MCG tablet TAKE 1 TABLET BY MOUTH DAILY BEFORE BREAKFAST. 10/24/19   Virgie Dad, MD  Zolpidem Tartrate 3.5 MG SUBL Place 3.5 mg under the tongue at bedtime  as needed. 12/31/19   [name], [name], NP     Positive ROS: Otherwise negative  All other systems have been reviewed and were otherwise negative with the exception of those mentioned in the HPI and as above.  Physical Exam: Constitutional: Alert, well-appearing, no acute distress Ears: External ears without lesions or tenderness. Ear canals are clear bilaterally.  TMs are  clear bilaterally with good mobility pneumatic otoscopy.  On hearing screening with the 512 1024 tuning fork she hears about the same in both ears with AC > BC bilaterally. Nasal: External nose without lesions. Septum slightly deviated to the right with moderate rhinitis.  She has slight inflammation on the right side of the septum that bled when I removed some crusting.  This was controlled easily with silver nitrate.  Both middle meatus regions were clear with no obvious mucopurulent discharge noted..  Oral: Lips and gums without lesions. Tongue and palate mucosa without lesions. Posterior oropharynx clear.  Mucous membranes on the palate and in the oropharynx were clear with no evidence of thrush or purulence. Neck: No palpable adenopathy or masses Respiratory: Breathing comfortably  Skin: No facial/neck lesions or rash noted.  Procedures  Assessment: Chronic rhinitis with eustachian tube dysfunction.  Plan: Recommended regular use of nasal steroid spray Nasacort or Flonase 2 sprays each nostril at night and uses saline irrigations during the day as needed. Presently TMs are clear bilaterally and no signs of active infection.   Radene Journey, MD

## 2020-02-01 NOTE — Progress Notes (Signed)
   Follow-Up Visit   Subjective  Darlene [name] is a 87 y.o. female who presents for the following: Annual Exam.  General skin examination Location: New spots right forehead and left chest Duration:  Quality:  Associated Signs/Symptoms: Modifying Factors:  Severity:  Timing: Context: History of melanoma  Objective  Well appearing patient in no apparent distress; mood and affect are within normal limits. Objective  Chest - Medial [facility]): Right arm-white scar- clear  Objective  Chest - Medial [facility]): Full body skin examination: No atypical moles or other pigmented spots or none mole skin cancer  Objective  Mid Forehead: Pink pearly 5 mm papule     Objective  Left Breast: Pink waxy 1 cm plaque       A full examination was performed including scalp, head, eyes, ears, nose, lips, neck, chest, axillae, abdomen, back, buttocks, bilateral upper extremities, bilateral lower extremities, hands, feet, fingers, toes, fingernails, and toenails. All findings within normal limits unless otherwise noted below.   Assessment & Plan    Personal history of malignant melanoma of skin Chest - Medial (Center)  6 month recheck  Screening exam for skin cancer Chest - Medial (Center)  Yearly skin check, encouraged to self examine her skin twice annually  Neoplasm of uncertain behavior of skin (2) Mid Forehead  Skin / nail biopsy Type of biopsy: tangential   Informed consent: discussed and consent obtained   Timeout: patient name, date of birth, surgical site, and procedure verified   Procedure prep:  Patient was prepped and draped in usual sterile fashion (Non sterile) Prep type:  Chlorhexidine Anesthesia: the lesion was anesthetized in a standard fashion   Anesthetic:  1% lidocaine w/ epinephrine 1-100,000 local infiltration Instrument used: flexible razor blade   Outcome: patient tolerated procedure well   Post-procedure details: wound care instructions given     Specimen 1 - Surgical pathology Differential Diagnosis: bcc vs scc  Check Margins: No  Left Breast  Skin / nail biopsy Type of biopsy: tangential   Informed consent: discussed and consent obtained   Timeout: patient name, date of birth, surgical site, and procedure verified   Procedure prep:  Patient was prepped and draped in usual sterile fashion (Non sterile) Prep type:  Chlorhexidine Anesthesia: the lesion was anesthetized in a standard fashion   Anesthetic:  1% lidocaine w/ epinephrine 1-100,000 local infiltration Instrument used: flexible razor blade   Outcome: patient tolerated procedure well   Post-procedure details: wound care instructions given    Specimen 2 - Surgical pathology Differential Diagnosis: bcc vd scc  Check Margins: No      I, Lavonna Monarch, MD, have reviewed all documentation for this visit.  The documentation on 02/01/20 for the exam, diagnosis, procedures, and orders are all accurate and complete.

## 2020-02-05 ENCOUNTER — Telehealth: Payer: Self-pay

## 2020-02-05 NOTE — Telephone Encounter (Signed)
Path to patient  

## 2020-02-07 ENCOUNTER — Telehealth: Payer: Self-pay

## 2020-02-07 ENCOUNTER — Emergency Department (HOSPITAL_COMMUNITY)
Admission: EM | Admit: 2020-02-07 | Discharge: 2020-02-08 | Disposition: A | Discharge status: Home / Self Care | Payer: Medicare Other | Attending: Emergency Medicine | Admitting: Emergency Medicine

## 2020-02-07 ENCOUNTER — Encounter (HOSPITAL_COMMUNITY): Payer: Self-pay | Admitting: Emergency Medicine

## 2020-02-07 ENCOUNTER — Emergency Department (HOSPITAL_COMMUNITY): Payer: Medicare Other

## 2020-02-07 DIAGNOSIS — R519 Headache, unspecified: Secondary | ICD-10-CM | POA: Diagnosis not present

## 2020-02-07 DIAGNOSIS — E039 Hypothyroidism, unspecified: Secondary | ICD-10-CM | POA: Insufficient documentation

## 2020-02-07 DIAGNOSIS — J45909 Unspecified asthma, uncomplicated: Secondary | ICD-10-CM | POA: Insufficient documentation

## 2020-02-07 DIAGNOSIS — M79602 Pain in left arm: Secondary | ICD-10-CM | POA: Diagnosis not present

## 2020-02-07 DIAGNOSIS — Z7901 Long term (current) use of anticoagulants: Secondary | ICD-10-CM | POA: Diagnosis not present

## 2020-02-07 DIAGNOSIS — I10 Essential (primary) hypertension: Secondary | ICD-10-CM | POA: Insufficient documentation

## 2020-02-07 DIAGNOSIS — Z85828 Personal history of other malignant neoplasm of skin: Secondary | ICD-10-CM | POA: Insufficient documentation

## 2020-02-07 DIAGNOSIS — Z79899 Other long term (current) drug therapy: Secondary | ICD-10-CM | POA: Diagnosis not present

## 2020-02-07 DIAGNOSIS — R52 Pain, unspecified: Secondary | ICD-10-CM

## 2020-02-07 DIAGNOSIS — R079 Chest pain, unspecified: Secondary | ICD-10-CM | POA: Diagnosis present

## 2020-02-07 LAB — CBC
HCT: 41.9 % (ref 36.0–46.0)
Hemoglobin: 14.3 g/dL (ref 12.0–15.0)
MCH: 30.6 pg (ref 26.0–34.0)
MCHC: 34.1 g/dL (ref 30.0–36.0)
MCV: 89.5 fL (ref 80.0–100.0)
Platelets: 267 10*3/uL (ref 150–400)
RBC: 4.68 MIL/uL (ref 3.87–5.11)
RDW: 13.1 % (ref 11.5–15.5)
WBC: 7.3 10*3/uL (ref 4.0–10.5)
nRBC: 0 % (ref 0.0–0.2)

## 2020-02-07 LAB — BASIC METABOLIC PANEL
Anion gap: 10 (ref 5–15)
BUN: 15 mg/dL (ref 8–23)
CO2: 23 mmol/L (ref 22–32)
Calcium: 9.5 mg/dL (ref 8.9–10.3)
Chloride: 109 mmol/L (ref 98–111)
Creatinine, Ser: 0.78 mg/dL (ref 0.44–1.00)
GFR, Estimated: >60 mL/min (ref >60)
Glucose, Bld: 115 mg/dL — ABNORMAL HIGH (ref 70–99)
Potassium: 3.8 mmol/L (ref 3.5–5.1)
Sodium: 142 mmol/L (ref 135–145)

## 2020-02-07 LAB — TROPONIN I (HIGH SENSITIVITY): Troponin I (High Sensitivity): 3 ng/L (ref <18)

## 2020-02-07 NOTE — Telephone Encounter (Signed)
Patient is currently in ED.   Nurse Assessment Nurse: Mancel Bale, RN, Butch Penny Date/Time Eilene Ghazi Time): 02/07/2020 4:06:38 PM Confirm and document reason for call. If symptomatic, describe symptoms. ---Caller states she is having symptoms of an intermittent HA for 5 days. States she also feels a "pulsating" in the top head. States she is taking tylenol for the pain. Does the patient have any new or worsening symptoms? ---Yes Will a triage be completed? ---Yes Related visit to physician within the last 2 weeks? ---Yes Does the PT have any chronic conditions? (i.e. diabetes, asthma, this includes High risk factors for pregnancy, etc.) ---Yes List chronic conditions. ---CVA, Anti-coagulation, Pre-diabetic Is this a behavioral health or substance abuse call? ---No Guidelines Guideline Title Affirmed Question Affirmed Notes Nurse Date/Time (Eastern Time) Headache Sounds like a lifethreatening emergency to the triager Mancel Bale, RN, Butch Penny 02/07/2020 4:09:58 PM Disp. Time Eilene Ghazi Time) Disposition Final User 02/07/2020 4:05:00 PM Send to Urgent [place] Snare 02/07/2020 4:20:49 PM 911 Outcome Documentation Mancel Bale, RN, Butch Penny Reason: Caller did not answer 02/07/2020 4:15:22 PM Call EMS 911 Now Yes Mancel Bale, RN, Butch Penny PLEASE NOTE: All timestamps contained within this report are represented as [place] Standard Time. CONFIDENTIALTY NOTICE: This fax transmission is intended only for the addressee. It contains information that is legally privileged, confidential or otherwise protected from use or disclosure. If you are not the intended recipient, you are strictly prohibited from reviewing, disclosing, copying using or disseminating any of this information or taking any action in reliance on or regarding this information. If you have received this fax in error, please notify us immediately by telephone so that we can arrange for its return to [place]. Phone: [phone], Toll-Free: [phone], Fax:  [phone] Page: 2 of 2 Call Id: [id number] [place] Disagree/Comply Comply Caller Understands Yes PreDisposition InappropriateToAsk Care Advice Given Per Guideline CALL EMS 911 NOW: * Immediate medical attention is needed. You need to hang up and call 911 (or an ambulance). * Triager Discretion: I'll call you back in a few minutes to be sure you were able to reach them. Comments User: Marijo Conception, RN Date/Time Eilene Ghazi Time): 02/07/2020 4:16:52 PM Caller states when the pain occurs with the HA it is a level 10/10. States yesterday she had severe chest pain radiating to her jaw and down her left arm. States she cannot stand up because her balance is off and she becomes severely dizzy. Referrals GO TO FACILITY UNDECIDED

## 2020-02-07 NOTE — ED Triage Notes (Signed)
Patient BIB [facility] for complaint of chest pain with radiation into left arm yesterday and a pulsing sensation in the top of the head.

## 2020-02-07 NOTE — Telephone Encounter (Signed)
She needs to go to ER if she thins she has had a stroke.  Orma Flaming, MD [place]

## 2020-02-07 NOTE — Telephone Encounter (Signed)
Patient is calling in stating she thinks she may have had another stroke, has had a headache and dizziness for the last 10 days. Did have patient speak with triage.

## 2020-02-08 ENCOUNTER — Emergency Department (HOSPITAL_COMMUNITY): Payer: Medicare Other

## 2020-02-08 IMAGING — CT CT ANGIO HEAD
3 of 11 series · 9 of 47 positions shown · IV contrast (omnipaque)
Comparison: [DATE]

CLINICAL DATA: Nonspecific dizziness with pulsating headache for 5
days, worse at the vertex.

EXAM:
CT ANGIOGRAPHY HEAD
TECHNIQUE: Multidetector CT imaging of the head was performed using the
standard protocol during bolus administration of intravenous
contrast. Multiplanar CT image reconstructions and MIPs were
obtained to evaluate the vascular anatomy.
CONTRAST:  80mL OMNIPAQUE IOHEXOL 350 MG/ML SOLN

[Series 6: head bone · axial · 0.42mm/px · z∈[-186,-88]mm · 4 of 83 slices shown]
[im 17/83  bone]
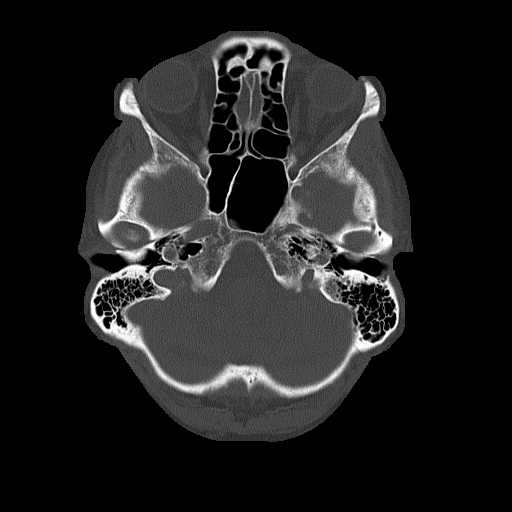
[im 33/83  bone]
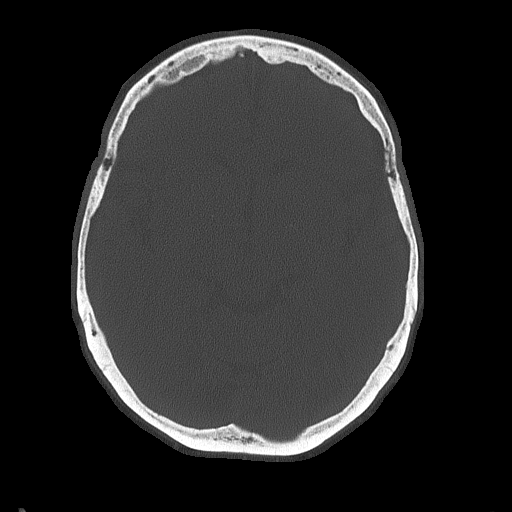
[im 50/83  bone]
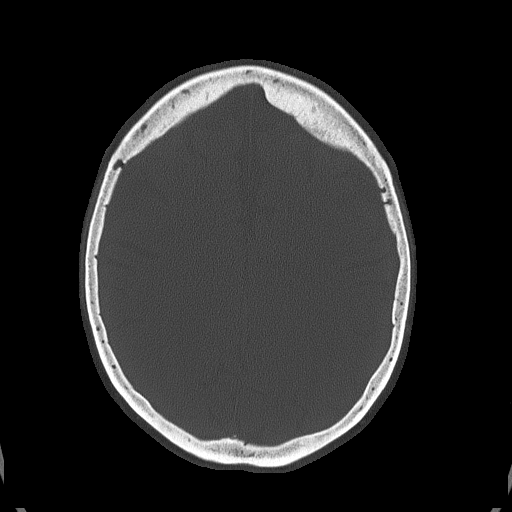
[im 66/83  bone]
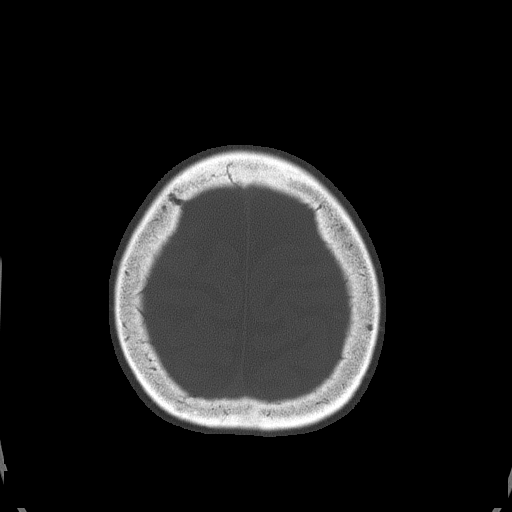

[Series 7: coronal · coronal · 0.32mm/px · 1 of 70 slices shown]
[im 35/70  brain]
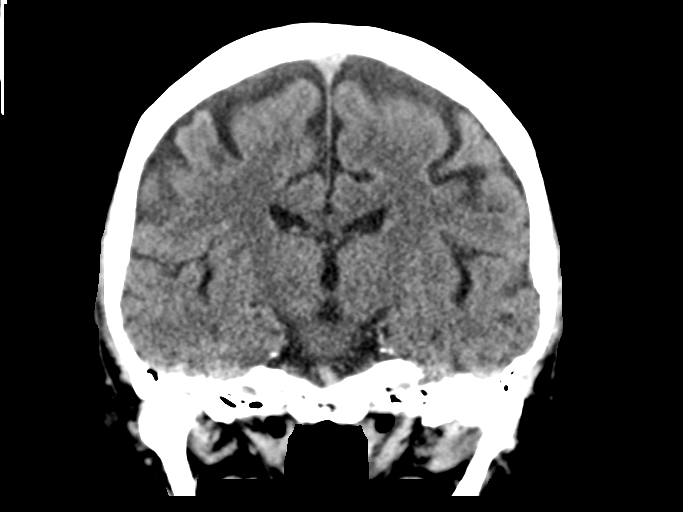

[Series 9: cow 2.0 · axial · 0.44mm/px · z∈[-212,-116]mm · 4 of 81 slices shown]
[im 17/81  brain]
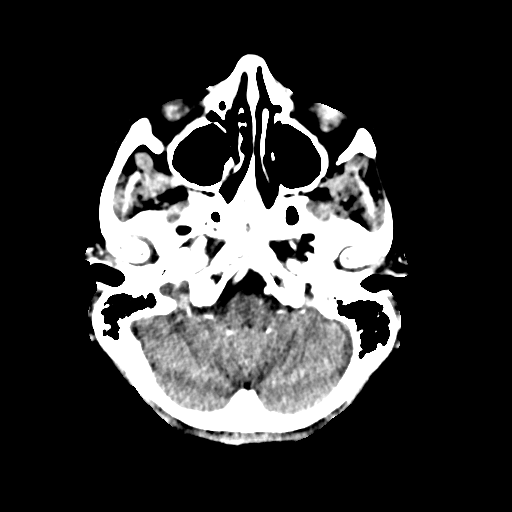
[im 33/81  bone]
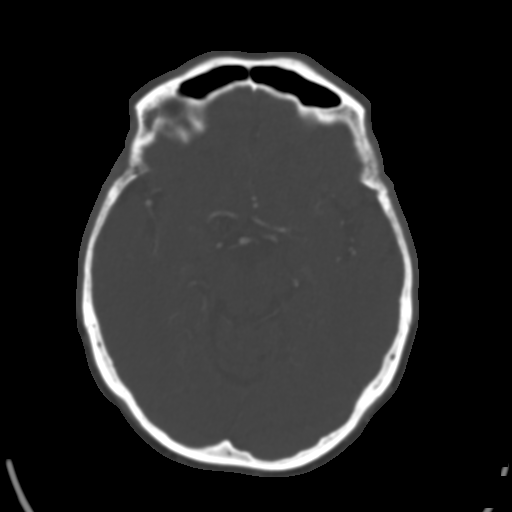
[im 49/81  brain]
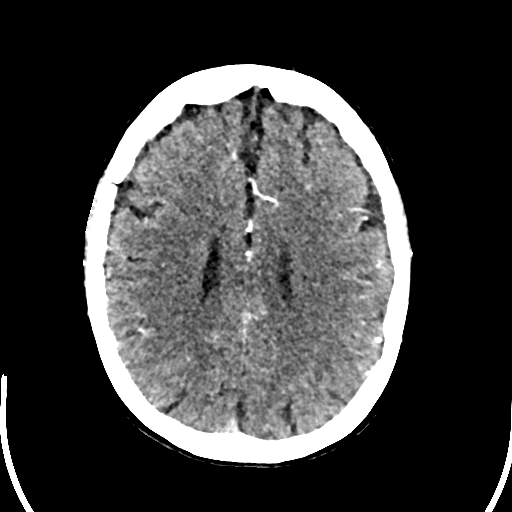
[im 65/81  bone]
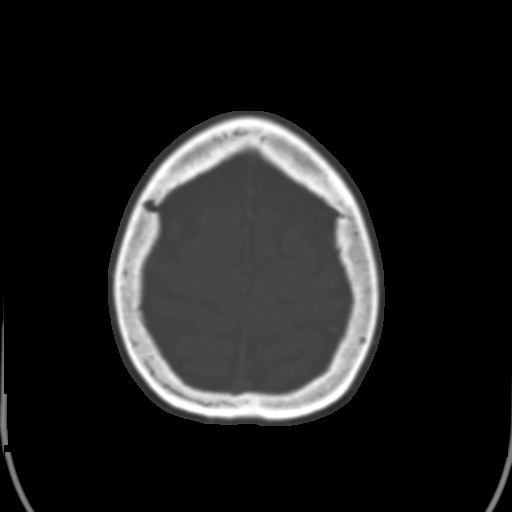

[9 of 47 positions shown; findings below may reference images not displayed]

FINDINGS: CT HEAD

Brain: No evidence of acute infarction, hemorrhage, hydrocephalus,
extra-axial collection or mass lesion/mass effect. Remote right
occipital infarct. Mild for age cerebral volume loss

Vascular: See below

Skull: Negative for fracture or focal lesion.

Sinuses: Clear

Orbits: Left cataract resection.  No explanation for headache.

CTA HEAD

Anterior circulation: Atheromatous calcification along the carotid
siphons. No significant stenosis, proximal occlusion, or vascular
malformation. A tiny outpouching inferiorly from the left
supraclinoid ICA is most likely an infundibulum unable to be
completely resolved by CTA.

Posterior circulation: No significant stenosis, proximal occlusion,
aneurysm, or vascular malformation. Tortuous appearance of the left
P1 segment, likely developmental.

Venous sinuses: Not opacified on this arterial study

Anatomic variants: None significant
IMPRESSION: 1. No emergent finding or specific explanation for headache.
2. Remote right occipital infarction.

## 2020-02-08 MED ORDER — IOHEXOL 350 MG/ML SOLN
80.0000 mL | Freq: Once | INTRAVENOUS | Status: AC | PRN
  Administered 2020-02-08: 80 mL via INTRAVENOUS

## 2020-02-08 NOTE — ED Provider Notes (Signed)
[facility] EMERGENCY DEPARTMENT Provider Note   CSN: [id number] Arrival date & time: 02/07/20  1744     History Chief Complaint  Patient presents with  . Chest Pain    Darlene [name] is a 87 y.o. female.  87 year old female with multiple medical problems documented below the presents emerged part today with 2 symptoms.  Patient states that she is had on and off again head pressure for the last couple weeks.  She states it feels like it is pressure to back of her head that radiates up to the top of her head.  She states there is no real pain with it.  She has a history of stroke and does not feel that.  She states that nothing makes it better or makes it worse.  She states she had associated vertigo but the vertigo is not new.  She states yesterday she had episode of chest pain that radiated into her back and her left arm.  This worried her but she did not want to come to the emergency room because she did not want to wait.  She called somebody's office today who told her to come the ER for her symptoms.  Patient apparently only wants to be worked up for her head pressure.  She has refused multiple testings for her chest pain at this point.  Patient also states that she had multiple episodes of the feeling of a marble rolling from ear to ear.  She states that is only way she is able to describe the discomfort.  She sees Dr. Lucia Gaskins for this   Chest Pain      Past Medical History:  Diagnosis Date  . Anxiety   . Asthma 04/06/2018  . Colon polyps   . Depression   . Hyperlipidemia   . Hypertension   . Hypothyroidism   . Skin cancer   . Sleep apnea   . Stroke [facility]) 2008    Patient Active Problem List   Diagnosis Date Noted  . Prediabetes 01/09/2020  . Adverse reaction to COVID-19 vaccine 11/29/2019  . Chronic cough 11/24/2019  . Melanoma of skin ([place]) 08/24/2019  . Palpitations 06/07/2019  . Schatzki's ring 05/12/2019  . Hiatal hernia 05/12/2019  . Insomnia  03/29/2019  . Back pain with left-sided sciatica 03/01/2019  . Daytime sleepiness 11/30/2018  . History of colonic polyps 09/03/2018  . Dysphagia 09/03/2018  . Pyrosis 09/03/2018  . Chronic sore throat 09/03/2018  . Actinic keratoses 02/23/2018  . Seasonal and perennial allergic rhinitis 01/31/2018  . Incontinent of urine 12/01/2017  . Essential hypertension 09/06/2017  . History of CVA (cerebrovascular accident) 09/06/2017  . Hyperlipidemia 09/06/2017  . GAD (generalized anxiety disorder) 09/06/2017  . Acquired hypothyroidism 09/06/2017  . Chronic sinusitis 09/06/2017  . Gastroesophageal reflux disease 09/06/2017    Past Surgical History:  Procedure Laterality Date  . ADENOIDECTOMY    . APPENDECTOMY  1960  . COLONOSCOPY    . GUM SURGERY  03/2019  . [place] SURGERY  2014  . MELANOMA EXCISION  2020  . MESH APPLIED TO LAP PORT    . TONSILLECTOMY  1940     OB History   No obstetric history on file.     Family History  Problem Relation Age of Onset  . Lung cancer Mother 87       smoker  . Heart disease Mother   . Stroke Father 57  . Heart attack Father   . Allergic rhinitis Sister   .  Hypertension Sister   . CVA Brother   . Lung cancer Maternal Grandfather   . Heart attack Paternal Grandfather   . Psoriasis Daughter   . Rheum arthritis Daughter   . Bipolar disorder Daughter   . Colon cancer Neg Hx   . Esophageal cancer Neg Hx   . Inflammatory bowel disease Neg Hx   . Liver disease Neg Hx   . Pancreatic cancer Neg Hx   . Stomach cancer Neg Hx   . Rectal cancer Neg Hx     Social History   Tobacco Use  . Smoking status: Never Smoker  . Smokeless tobacco: Never Used  Vaping Use  . Vaping Use: Never used  Substance Use Topics  . Alcohol use: Yes    Comment: hardly ever  . Drug use: Never    Home Medications Prior to Admission medications   Medication Sig Start Date End Date Taking? Authorizing Provider  acetaminophen (TYLENOL) 325 MG tablet Take  325-650 mg by mouth every 6 (six) hours as needed for moderate pain (for sinus headache).    [provider]  azelastine (ASTELIN) 0.1 % nasal spray Place 2 sprays into both nostrils 2 (two) times daily. 01/31/18   Valentina Shaggy, MD  clopidogrel (PLAVIX) 75 MG tablet TAKE 1 TABLET BY MOUTH EVERY DAY 11/20/19   Virgie Dad, MD  diazepam (VALIUM) 5 MG tablet Take 0.5 tablets (2.5 mg total) by mouth as needed (anxiety). 11/26/19   Orma Flaming, MD  fluticasone [facility]) 50 MCG/ACT nasal spray SPRAY 2 SPRAYS IN EACH NOSTRIL AT BEDTIME 12/25/19   Rozetta Nunnery, MD  losartan (COZAAR) 50 MG tablet TAKE 1 TABLET BY MOUTH EVERY DAY 12/17/19   [name], [name], NP  montelukast (SINGULAIR) 10 MG tablet Take 1 tablet (10 mg total) by mouth at bedtime. 11/26/19   Orma Flaming, MD  omeprazole (PRILOSEC) 40 MG capsule Take 1 capsule (40 mg total) by mouth 2 (two) times daily before a meal. 11/23/19   Mansouraty, Telford Nab., MD  pravastatin (PRAVACHOL) 40 MG tablet Take 1 tablet (40 mg total) by mouth at bedtime. 09/26/19   Adrian Prows, MD  sodium chloride (OCEAN) 0.65 % SOLN nasal spray Place 1 spray into both nostrils as needed for congestion.    [provider]  SYNTHROID 100 MCG tablet TAKE 1 TABLET BY MOUTH DAILY BEFORE BREAKFAST. 10/24/19   Virgie Dad, MD  Zolpidem Tartrate 3.5 MG SUBL Place 3.5 mg under the tongue at bedtime as needed. 12/31/19   [name], [name], NP    Allergies    Norco [hydrocodone-acetaminophen], Dust mite extract, Oxycontin [oxycodone hcl], and Pollen extract  Review of Systems   Review of Systems  Cardiovascular: Positive for chest pain.  All other systems reviewed and are negative.   Physical Exam Updated Vital Signs BP (!) 153/65   Pulse 61   Temp 99 F (37.2 C) (Oral)   Resp 17   Ht 5\' 3"  (1.6 m)   Wt 71.7 kg   SpO2 99%   BMI 27.99 kg/m   Physical Exam Vitals and nursing note reviewed.  Constitutional:      Appearance: She is  well-developed and well-nourished.  HENT:     Head: Normocephalic and atraumatic.     Nose: No congestion or rhinorrhea.     Mouth/Throat:     Mouth: Mucous membranes are moist.  Eyes:     Pupils: Pupils are equal, round, and reactive to light.  Cardiovascular:  Rate and Rhythm: Normal rate and regular rhythm.  Pulmonary:     Effort: No respiratory distress.     Breath sounds: No stridor.  Abdominal:     General: There is no distension.     Palpations: Abdomen is soft.  Musculoskeletal:     Cervical back: Normal range of motion.  Skin:    General: Skin is warm and dry.     Coloration: Skin is not cyanotic or pale.  Neurological:     Mental Status: She is alert. Mental status is at baseline.     ED Results / Procedures / Treatments   Labs (all labs ordered are listed, but only abnormal results are displayed) Labs Reviewed  BASIC METABOLIC PANEL - Abnormal; Notable for the following components:      Result Value   Glucose, Bld 115 (*)    All other components within normal limits  CBC  TROPONIN I (HIGH SENSITIVITY)    EKG EKG Interpretation  Date/Time:  Thursday February 07 2020 17:57:38 EST Ventricular Rate:  74 PR Interval:  178 QRS Duration: 70 QT Interval:  420 QTC Calculation: 466 R Axis:   18 Text Interpretation: Normal sinus rhythm Cannot rule out Anterior infarct , age undetermined Abnormal ECG Confirmed by Merrily Pew [phone]) on 02/08/2020 4:45:53 AM   Radiology CT Angio Head W or Wo Contrast  Result Date: 02/08/2020 CLINICAL DATA:  Nonspecific dizziness with pulsating headache for 5 days, worse at the vertex. EXAM: CT ANGIOGRAPHY HEAD TECHNIQUE: Multidetector CT imaging of the head was performed using the standard protocol during bolus administration of intravenous contrast. Multiplanar CT image reconstructions and MIPs were obtained to evaluate the vascular anatomy. CONTRAST:  18mL OMNIPAQUE IOHEXOL 350 MG/ML SOLN COMPARISON:  09/20/2019 FINDINGS: CT  HEAD Brain: No evidence of acute infarction, hemorrhage, hydrocephalus, extra-axial collection or mass lesion/mass effect. Remote right occipital infarct. Mild for age cerebral volume loss Vascular: See below Skull: Negative for fracture or focal lesion. Sinuses: Clear Orbits: Left cataract resection.  No explanation for headache. CTA HEAD Anterior circulation: Atheromatous calcification along the carotid siphons. No significant stenosis, proximal occlusion, or vascular malformation. A tiny outpouching inferiorly from the left supraclinoid ICA is most likely an infundibulum unable to be completely resolved by CTA. Posterior circulation: No significant stenosis, proximal occlusion, aneurysm, or vascular malformation. Tortuous appearance of the left P1 segment, likely developmental. Venous sinuses: Not opacified on this arterial study Anatomic variants: None significant IMPRESSION: 1. No emergent finding or specific explanation for headache. 2. Remote right occipital infarction. Electronically Signed   By: Monte Fantasia M.D.   On: 02/08/2020 04:29    Procedures Procedures (including critical care time)  Medications Ordered in ED Medications  iohexol (OMNIPAQUE) 350 MG/ML injection 80 mL (80 mLs Intravenous Contrast Given 02/08/20 0357)    ED Course  I have reviewed the triage vital signs and the nursing notes.  Pertinent labs & imaging results that were available during my care of the patient were reviewed by me and considered in my medical decision making (see chart for details).    MDM Rules/Calculators/A&P                         Will CTA head.  Once again patient is refusing any and all work-up for her chest pain from yesterday.  She states that has not why she came that she came to get a scan of her head.  Her CT does not show any acute  findings.  No obvious reason for her headache.  She can continue following up with her primary doctor and other outpatient physicians for the same.  Final  Clinical Impression(s) / ED Diagnoses Final diagnoses:  Nonintractable headache, unspecified chronicity pattern, unspecified headache type    Rx / DC Orders ED Discharge Orders    None       Pang Robers, Corene Cornea, MD 02/08/20 [phone]

## 2020-02-08 NOTE — ED Notes (Signed)
E-signature pad unavailable at time of pt discharge. This RN discussed discharge materials with pt and answered all pt questions. Pt stated understanding of discharge material. ? ?

## 2020-02-11 ENCOUNTER — Telehealth: Payer: Medicare Other | Admitting: Family Medicine

## 2020-02-11 ENCOUNTER — Other Ambulatory Visit: Payer: Self-pay

## 2020-02-11 ENCOUNTER — Telehealth: Payer: Self-pay

## 2020-02-11 ENCOUNTER — Ambulatory Visit: Payer: Medicare Other | Admitting: Cardiology

## 2020-02-11 ENCOUNTER — Encounter: Payer: Self-pay | Admitting: Internal Medicine

## 2020-02-11 ENCOUNTER — Ambulatory Visit (INDEPENDENT_AMBULATORY_CARE_PROVIDER_SITE_OTHER): Payer: Medicare Other | Admitting: Internal Medicine

## 2020-02-11 VITALS — BP 144/82 | HR 83 | Temp 98.0°F | Ht 63.0 in | Wt 160.4 lb

## 2020-02-11 DIAGNOSIS — I10 Essential (primary) hypertension: Secondary | ICD-10-CM | POA: Diagnosis not present

## 2020-02-11 DIAGNOSIS — M792 Neuralgia and neuritis, unspecified: Secondary | ICD-10-CM | POA: Diagnosis not present

## 2020-02-11 DIAGNOSIS — R079 Chest pain, unspecified: Secondary | ICD-10-CM

## 2020-02-11 NOTE — Progress Notes (Deleted)
Patient: Darlene [name] MRN: [id number] DOB: December 26, 1933 PCP: Darlene Flaming, MD     I connected with Darlene [name] on 02/11/20 at @CHLAPPTIME @ by a video enabled telemedicine application and verified that I am speaking with the correct person using two identifiers.  Location patient: Home Location provider: [facility], Office Persons participating in this virtual visit: ***  I discussed the limitations of evaluation and management by telemedicine and the availability of in person appointments. The patient expressed understanding and agreed to proceed.   Subjective:  No chief complaint on file.   HPI: The patient is a 87 y.o. female who presents today for ***  Review of Systems  Allergies Patient is allergic to norco [hydrocodone-acetaminophen], dust mite extract, oxycontin [oxycodone hcl], and pollen extract.  Past Medical History Patient  has a past medical history of Anxiety, Asthma (04/06/2018), Colon polyps, Depression, Hyperlipidemia, Hypertension, Hypothyroidism, Skin cancer, Sleep apnea, and Stroke ([place]) (2008).  Surgical History Patient  has a past surgical history that includes Lumbar disc surgery (2014); Appendectomy (1960); Tonsillectomy (1940); Adenoidectomy; Mesh applied to lap port; Colonoscopy; Gum surgery (03/2019); and Melanoma excision (2020).  Family History Pateint's family history includes Allergic rhinitis in her sister; Bipolar disorder in her daughter; CVA in her brother; Heart attack in her father and paternal grandfather; Heart disease in her mother; Hypertension in her sister; Lung cancer in her maternal grandfather; Lung cancer (age of onset: 49) in her mother; Psoriasis in her daughter; Rheum arthritis in her daughter; Stroke (age of onset: 26) in her father.  Social History Patient  reports that she has never smoked. She has never used smokeless tobacco. She reports current alcohol use. She reports that she does not use drugs.    Objective: There  were no vitals filed for this visit.  There is no height or weight on file to calculate BMI.  Physical Exam     Assessment/plan:      No follow-ups on file.  Records requested if needed. Time spent with patient: *** minutes, of which >50% was spent in obtaining information about her symptoms, reviweing her previous labs, evaluations, and treatments, counseling her about her conditions (please see discussed topics above), and developing a plan to further investigate it; she had a number of questions which I addressed.    Tobe Sos, MD [place]  02/11/2020

## 2020-02-11 NOTE — Telephone Encounter (Signed)
I called the pt at 11:20 to get her ready for virtual appointment with Dr. Rogers Blocker. Pt did not answer or call the office back until 11:45am which is considered to be a late appointment. Provider prefers to see her in clinic, and wants her to be reschedule for an in office visit. Pt is upset and is requesting to be seen today.

## 2020-02-11 NOTE — Progress Notes (Signed)
Subjective:    Patient ID: Darlene [name], female    DOB: [birth year], 87 y.o.   MRN: [id number]  HPI The patient is here for follow up from the emergency room.  12/07/2020-she went to the emergency room for chest pain, headache.  Headache: She states she was having intermittent head pressure for the past couple of weeks.  She states it feels it felt like it was a pressure in the back of her head that radiated to the top of her head.  She has a history of a stroke and did not feel that it was similar to that.  Nothing was making the headache better or worse.    She also stated she had multiple episodes of a feeling of a marble rolling from her ear to ear.  This is chronic.  She sees Dr. Lucia Gaskins.  Chest pain: The day prior she did have an episode of chest pain that radiated into her back and left arm.  This concerned her, but not the reason she went to the emergency room.  She did not want further evaluation of the chest pain-only the headache.  BMP, CBC and troponin were normal in the emergency room.  EKG x2 was done.  No concerning changes were noted.  CT angio of the head was done-no emergent findings or specific explanation for headache.  Remote right occipital infarction noted.  Her exam in the ED was nonfocal.    Since last spring she had bad periodontal work.  After she had balance problems and lots of drainage.  The balance problems - Dr Lucia Gaskins sent her for PT and that subsided.   On Wednesday - she had chest and back pains and pain down her left arm and wrist.  She was dizzy that day.  This did concern her because she has had some chest pain or left arm pain, but they have never all occurred together.  She has seen cardiology in the past.  When this pain occurred she was watching the news.  She is not experience any chest pain or left arm pain since then.  Thursday she got a pulsating on the top of her head.  There was no pain.  It would feel like pressure and it would comes in waves.   Today the sensation is slightly lower and off to the right.  Nothing makes it better or worse.  There is no relation to position.  No change in meds or supplements.  It was initially constant but now is less frequent.  She denies any new weakness or numbness/tingling.   She denies any increased stress recently.  Medications and allergies reviewed with patient and updated if appropriate.  Patient Active Problem List   Diagnosis Date Noted  . Prediabetes 01/09/2020  . Adverse reaction to COVID-19 vaccine 11/29/2019  . Chronic cough 11/24/2019  . Melanoma of skin ([place]) 08/24/2019  . Palpitations 06/07/2019  . Schatzki's ring 05/12/2019  . Hiatal hernia 05/12/2019  . Insomnia 03/29/2019  . Back pain with left-sided sciatica 03/01/2019  . Daytime sleepiness 11/30/2018  . History of colonic polyps 09/03/2018  . Dysphagia 09/03/2018  . Pyrosis 09/03/2018  . Chronic sore throat 09/03/2018  . Actinic keratoses 02/23/2018  . Seasonal and perennial allergic rhinitis 01/31/2018  . Incontinent of urine 12/01/2017  . Essential hypertension 09/06/2017  . History of CVA (cerebrovascular accident) 09/06/2017  . Hyperlipidemia 09/06/2017  . GAD (generalized anxiety disorder) 09/06/2017  . Acquired hypothyroidism 09/06/2017  . Chronic sinusitis 09/06/2017  .  Gastroesophageal reflux disease 09/06/2017    Current Outpatient Medications on File Prior to Visit  Medication Sig Dispense Refill  . acetaminophen (TYLENOL) 325 MG tablet Take 325-650 mg by mouth every 6 (six) hours as needed for moderate pain (for sinus headache).    Marland Kitchen azelastine (ASTELIN) 0.1 % nasal spray Place 2 sprays into both nostrils 2 (two) times daily. 30 mL 5  . clopidogrel (PLAVIX) 75 MG tablet TAKE 1 TABLET BY MOUTH EVERY DAY 90 tablet 1  . diazepam (VALIUM) 5 MG tablet Take 0.5 tablets (2.5 mg total) by mouth as needed (anxiety). 30 tablet 0  . fluticasone (FLONASE) 50 MCG/ACT nasal spray SPRAY 2 SPRAYS IN EACH NOSTRIL AT  BEDTIME 48 mL 2  . ketoconazole (NIZORAL) 2 % shampoo Apply topically.    Marland Kitchen levocetirizine (XYZAL) 5 MG tablet 1 tablet in the evening    . losartan (COZAAR) 50 MG tablet TAKE 1 TABLET BY MOUTH EVERY DAY 90 tablet 0  . montelukast (SINGULAIR) 10 MG tablet Take 1 tablet (10 mg total) by mouth at bedtime. 90 tablet 1  . moxifloxacin (VIGAMOX) 0.5 % ophthalmic solution Apply to eye.    Marland Kitchen omeprazole (PRILOSEC) 40 MG capsule Take 1 capsule (40 mg total) by mouth 2 (two) times daily before a meal. 90 capsule 3  . pravastatin (PRAVACHOL) 40 MG tablet Take 1 tablet (40 mg total) by mouth at bedtime. 90 tablet 2  . sodium chloride (OCEAN) 0.65 % SOLN nasal spray Place 1 spray into both nostrils as needed for congestion.    Marland Kitchen SYNTHROID 100 MCG tablet TAKE 1 TABLET BY MOUTH DAILY BEFORE BREAKFAST. 90 tablet 1  . Zolpidem Tartrate 3.5 MG SUBL Place 3.5 mg under the tongue at bedtime as needed. 30 tablet 0   No current facility-administered medications on file prior to visit.    Past Medical History:  Diagnosis Date  . Anxiety   . Asthma 04/06/2018  . Colon polyps   . Depression   . Hyperlipidemia   . Hypertension   . Hypothyroidism   . Skin cancer   . Sleep apnea   . Stroke [facility]) 2008    Past Surgical History:  Procedure Laterality Date  . ADENOIDECTOMY    . APPENDECTOMY  1960  . COLONOSCOPY    . GUM SURGERY  03/2019  . [place] SURGERY  2014  . MELANOMA EXCISION  2020  . MESH APPLIED TO LAP PORT    . TONSILLECTOMY  1940    Social History   Socioeconomic History  . Marital status: Widowed    Spouse name: Not on file  . Number of children: 3  . Years of education: Not on file  . Highest education level: Not on file  Occupational History  . Occupation: retired Radiographer, therapeutic  Tobacco Use  . Smoking status: Never Smoker  . Smokeless tobacco: Never Used  Vaping Use  . Vaping Use: Never used  Substance and Sexual Activity  . Alcohol use: Yes    Comment: hardly  ever  . Drug use: Never  . Sexual activity: Not on file  Other Topics Concern  . Not on file  Social History Narrative   Social History      Diet? ok      Do you drink/eat things with caffeine? Weak coffee, (much milk)      Marital status?          divorced  What year were you married? 1961      Do you live in a house, apartment, assisted living, condo, trailer, etc.? apartment      Is it one or more stories? 4      How many persons live in your home? Only me      Do you have any pets in your home? (please list) no      Highest level of education completed? 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             yes                         Type & how often? Walk- would love to return to water exercise      Advanced Directives      Do you have a living will? yes      Do you have a DNR form?           no                       If not, do you want to discuss one? no      Do you have signed POA/HPOA for forms? no      Functional Status      Do you have difficulty bathing or dressing yourself?  no      Do you have difficulty preparing food or eating? no      Do you have difficulty managing your medications? no      Do you have difficulty managing your finances?  no      Do you have difficulty affording your medications? no      Social Determinants of Health   Financial Resource Strain: Not on file  Food Insecurity: Not on file  Transportation Needs: Not on file  Physical Activity: Not on file  Stress: Not on file  Social Connections: Not on file    Family History  Problem Relation Age of Onset  . Lung cancer Mother [id number]       smoker  . Heart disease Mother   . Stroke Father [id number]  . Heart attack Father   . Allergic rhinitis Sister   . Hypertension Sister   . CVA Brother   . Lung cancer Maternal Grandfather   . Heart attack Paternal Grandfather   . Psoriasis Daughter    . Rheum arthritis Daughter   . Bipolar disorder Daughter   . Colon cancer Neg Hx   . Esophageal cancer Neg Hx   . Inflammatory bowel disease Neg Hx   . Liver disease Neg Hx   . Pancreatic cancer Neg Hx   . Stomach cancer Neg Hx   . Rectal cancer Neg Hx     Review of Systems  Constitutional: Negative for fever.  Eyes: Negative for visual disturbance.  Respiratory: Negative for cough, shortness of breath and wheezing.   Cardiovascular: Positive for chest pain (one episode last week) and palpitations (freq in morning, transient). Negative for leg swelling.  Musculoskeletal:       Neck and upper back and neck muscle tightness-chronic  Neurological: Positive for numbness (tingling in feet x 1 year). Negative for dizziness, light-headedness and headaches.       Objective:   Vitals:   02/11/20 1527  BP: (!) 144/82  Pulse: 83  Temp: 98  F (36.7 C)  SpO2: 98%   BP Readings from Last 3 Encounters:  02/11/20 (!) 144/82  02/08/20 (!) 149/87  12/26/19 (!) 169/73   Wt Readings from Last 3 Encounters:  02/11/20 160 lb 6.4 oz (72.8 kg)  02/07/20 158 lb (71.7 kg)  12/26/19 159 lb (72.1 kg)   Body mass index is 28.41 kg/m.   Physical Exam    Constitutional: Appears well-developed and well-nourished. No distress.  HENT:  Head: Normocephalic and atraumatic.  Neck: Neck supple. No tracheal deviation present. No thyromegaly present.  No cervical lymphadenopathy Cardiovascular: Normal rate, regular rhythm and normal heart sounds.   No murmur heard. No carotid bruit .  No edema Pulmonary/Chest: Effort normal and breath sounds normal. No respiratory distress. No has no wheezes. No rales. Musculoskeletal: No tenderness with palpation of posterior neck or upper back.  Chronic muscle tightness. Neurological: Nonfocal, alert and orientated x3, gait normal Skin: Skin is warm and dry. Not diaphoretic.  No altered sensation of scalp with palpation Psychiatric: Normal mood and affect.  Behavior is normal.   CT Angio Head W or Wo Contrast CLINICAL DATA:  Nonspecific dizziness with pulsating headache for 5 days, worse at the vertex.  EXAM: CT ANGIOGRAPHY HEAD  TECHNIQUE: Multidetector CT imaging of the head was performed using the standard protocol during bolus administration of intravenous contrast. Multiplanar CT image reconstructions and MIPs were obtained to evaluate the vascular anatomy.  CONTRAST:  47mL OMNIPAQUE IOHEXOL 350 MG/ML SOLN  COMPARISON:  09/20/2019  FINDINGS: CT HEAD  Brain: No evidence of acute infarction, hemorrhage, hydrocephalus, extra-axial collection or mass lesion/mass effect. Remote right occipital infarct. Mild for age cerebral volume loss  Vascular: See below  Skull: Negative for fracture or focal lesion.  Sinuses: Clear  Orbits: Left cataract resection.  No explanation for headache.  CTA HEAD  Anterior circulation: Atheromatous calcification along the carotid siphons. No significant stenosis, proximal occlusion, or vascular malformation. A tiny outpouching inferiorly from the left supraclinoid ICA is most likely an infundibulum unable to be completely resolved by CTA.  Posterior circulation: No significant stenosis, proximal occlusion, aneurysm, or vascular malformation. Tortuous appearance of the left P1 segment, likely developmental.  Venous sinuses: Not opacified on this arterial study  Anatomic variants: None significant  IMPRESSION: 1. No emergent finding or specific explanation for headache. 2. Remote right occipital infarction.  Electronically Signed   By: Monte Fantasia M.D.   On: 02/08/2020 04:29   Lab Results  Component Value Date   WBC 7.3 02/07/2020   HGB 14.3 02/07/2020   HCT 41.9 02/07/2020   PLT 267 02/07/2020   GLUCOSE 115 (H) 02/07/2020   CHOL 180 12/26/2019   TRIG 123 12/26/2019   HDL 51 12/26/2019   LDLCALC 107 (H) 12/26/2019   ALT 13 12/26/2019   AST 16 12/26/2019   NA 142  02/07/2020   K 3.8 02/07/2020   CL 109 02/07/2020   CREATININE 0.78 02/07/2020   BUN 15 02/07/2020   CO2 23 02/07/2020   TSH 0.74 07/26/2019   INR 0.9 09/20/2019   HGBA1C 6.2 (H) 01/08/2020      Assessment & Plan:   Reviewed emergency room tests, results   Left-sided chest pain with left arm pain: Minimal work-up in the ED, which was negative.  She did not want further evaluation at that time No episodes since then This event did concern her and she is wanting to see cardiology for further evaluation She has seen Dr. Einar Gip in the past-referral  ordered for her to see him again  Scalp pulsating, neuralgia versus anxiety: This started last Thursday and was fairly constant at that time and is now intermittent and slowly becoming less frequent No pain CT angio of head without any concerning findings Advised that no treatment is needed and most likely this will slowly improve ?  Neuralgia versus anxiety Advised heat to the back of the neck and upper back-she is chronic muscle tightness.  The heat may help with this and may also help relax her Advised that if her symptoms do not completely resolve she should let her PCP or me know  Hypertension: Blood pressure here today is reasonable and she states is actually good for her Reviewing past blood pressures occasionally spikes-this is not related to her current symptoms No change in medication Continue losartan 50 mg daily   I spent 30 minutes dedicated to the care of this patient on the date of this encounter including review of recent labs, imaging and procedures, speciality notes, obtaining history, communicating with the patient, ordering medications, tests, and documenting clinical information in the EHR    This visit occurred during the SARS-CoV-2 public health emergency.  Safety protocols were in place, including screening questions prior to the visit, additional usage of staff PPE, and extensive cleaning of exam room while  observing appropriate contact time as indicated for disinfecting solutions.

## 2020-02-11 NOTE — Telephone Encounter (Signed)
Pt was transferred to front desk to reschedule after she was late for her virtual appt. Pt stated she was extremely upset. She stated our office sent her to the ED Thursday night where she didn't get home Friday morning til 6. Pt called to schedule an appt with Dr. Rogers Blocker asap and we had no availability so we scheduled her for today. Pt states she should've never had to wait that long to be seen, that she has had a fuzzy head all weekend. Pt says this is absolutely ridiculous and needs to be seen by an MD today. Told pt I would have our Walker call her, because I do not have the ability to book once our schedules our full. Pt said that she better be called ASAP.

## 2020-02-11 NOTE — Patient Instructions (Addendum)
   Medications changes include :   none   A referral was ordered for  Dr Einar Gip.     Someone from their office will call you to schedule an appointment.     Try heat for your neck and upper back tension.  I think your pulsating feeling will slowly go away -- if it does not please let Dr Rogers Blocker or myself know.

## 2020-02-14 ENCOUNTER — Encounter: Payer: Self-pay | Admitting: Cardiology

## 2020-02-14 ENCOUNTER — Ambulatory Visit: Payer: Medicare Other | Admitting: Cardiology

## 2020-02-14 ENCOUNTER — Other Ambulatory Visit: Payer: Self-pay

## 2020-02-14 VITALS — BP 135/72 | HR 72 | Temp 96.8°F | Resp 16 | Ht 63.0 in | Wt 162.4 lb

## 2020-02-14 DIAGNOSIS — I10 Essential (primary) hypertension: Secondary | ICD-10-CM

## 2020-02-14 DIAGNOSIS — M4722 Other spondylosis with radiculopathy, cervical region: Secondary | ICD-10-CM

## 2020-02-14 DIAGNOSIS — R0789 Other chest pain: Secondary | ICD-10-CM

## 2020-02-14 DIAGNOSIS — G4489 Other headache syndrome: Secondary | ICD-10-CM

## 2020-02-14 NOTE — Progress Notes (Signed)
Primary Physician/Referring:  Orma Flaming, MD  Patient ID: Darlene [name], female    DOB: 10/26/1933, 87 y.o.   MRN: [id number]  Chief Complaint  Patient presents with  . Dizziness   HPI:    Darlene [name]  is a 87 y.o. Caucasian female with hypertension, hyperlipidemia, h/o sleep apnea not on CPAP, prior history of stroke in the occipital region in 2006 without residual defect, on Plavix chronically and there was no diagnosis of atrial fibrillation.  She has had frequent palpitations, difficulty in controlling hypertension, extreme anxiety, multiple medication intolerances.  She is now referred back to me for evaluation of chest pain with radiation to her neck and also head.  She was recently evaluated for cranial discomfort/headache by MRI which was essentially normal except for old occipital infarct.  Patient states that over the past few weeks she has had initially pain along the front of the chest then along the arms and also to the neck and now radiates all the way to the cranium.  She has not had any further chest discomfort but continues to have neck pain and stiff neck and headache.  She continues to remain active and no exertional chest pain or dyspnea.  Past Medical History:  Diagnosis Date  . Anxiety   . Asthma 04/06/2018  . Colon polyps   . Depression   . Hyperlipidemia   . Hypertension   . Hypothyroidism   . Skin cancer   . Sleep apnea   . Stroke [facility]) 2008   Past Surgical History:  Procedure Laterality Date  . ADENOIDECTOMY    . APPENDECTOMY  1960  . COLONOSCOPY    . GUM SURGERY  03/2019  . [place] SURGERY  2014  . MELANOMA EXCISION  2020  . MESH APPLIED TO LAP PORT    . TONSILLECTOMY  1940   Family History  Problem Relation Age of Onset  . Lung cancer Mother 10       smoker  . Heart disease Mother   . Stroke Father 44  . Heart attack Father   . Allergic rhinitis Sister   . Hypertension Sister   . CVA Brother   . Lung cancer Maternal Grandfather    . Heart attack Paternal Grandfather   . Psoriasis Daughter   . Rheum arthritis Daughter   . Bipolar disorder Daughter   . Colon cancer Neg Hx   . Esophageal cancer Neg Hx   . Inflammatory bowel disease Neg Hx   . Liver disease Neg Hx   . Pancreatic cancer Neg Hx   . Stomach cancer Neg Hx   . Rectal cancer Neg Hx     Social History   Tobacco Use  . Smoking status: Never Smoker  . Smokeless tobacco: Never Used  Substance Use Topics  . Alcohol use: Yes    Comment: hardly ever   ROS  Review of Systems  Cardiovascular: Positive for palpitations. Negative for chest pain, dyspnea on exertion and leg swelling.  Gastrointestinal: Negative for melena.  Neurological: Positive for headaches.  Psychiatric/Behavioral: The patient is nervous/anxious.    Objective  Blood pressure 135/72, pulse 72, temperature (!) 96.8 F (36 C), temperature source Temporal, resp. rate 16, height 5\' 3"  (1.6 m), weight 162 lb 6.4 oz (73.7 kg), SpO2 98 %.  Vitals with BMI 02/14/2020 02/14/2020 02/14/2020  Height - - 5\' 3"   Weight - - 162 lbs 6 oz  BMI - - 123456  Systolic [id number] -  Diastolic 72 72 -  Pulse 72 66 -     Physical Exam Cardiovascular:     Rate and Rhythm: Normal rate and regular rhythm.     Pulses: Intact distal pulses.     Heart sounds: Normal heart sounds. No murmur heard. No gallop.      Comments: No leg edema, no JVD. Pulmonary:     Effort: Pulmonary effort is normal.     Breath sounds: Normal breath sounds.  Abdominal:     General: Bowel sounds are normal.     Palpations: Abdomen is soft.  Neurological:     General: No focal deficit present.     Mental Status: She is oriented to person, place, and time.  Psychiatric:        Mood and Affect: Mood normal.    Laboratory examination:   Recent Labs    04/07/19 2048 08/24/19 1540 09/20/19 1944 09/20/19 1956 12/26/19 1053 02/07/20 1756  NA 140   < > 142 143 142 142  K 3.8   < > 3.7 4.3 4.3 3.8  CL 107   < > 106 108 105  109  CO2 23   < > 26  --  31 23  GLUCOSE 122*   < > 130* 126* 114* 115*  BUN 12   < > 16 15 14 15   CREATININE 0.80   < > 0.71 0.70 0.80 0.78  CALCIUM 9.6   < > 9.4  --  10.0 9.5  GFRNONAA >60  --  >60  --  68 >60  GFRAA >60  --  >60  --  78  --    < > = values in this interval not displayed.   estimated creatinine clearance is 50.3 mL/min (by C-G formula based on SCr of 0.78 mg/dL).  CMP Latest Ref Rng & Units 02/07/2020 12/26/2019 09/20/2019  Glucose 70 - 99 mg/dL 115(H) 114(H) 126(H)  BUN 8 - 23 mg/dL 15 14 15   Creatinine 0.44 - 1.00 mg/dL 0.78 0.80 0.70  Sodium 135 - 145 mmol/L 142 142 143  Potassium 3.5 - 5.1 mmol/L 3.8 4.3 4.3  Chloride 98 - 111 mmol/L 109 105 108  CO2 22 - 32 mmol/L 23 31 -  Calcium 8.9 - 10.3 mg/dL 9.5 10.0 -  Total Protein 6.1 - 8.1 g/dL - 7.0 -  Total Bilirubin 0.2 - 1.2 mg/dL - 0.5 -  Alkaline Phos 38 - 126 U/L - - -  AST 10 - 35 U/L - 16 -  ALT 6 - 29 U/L - 13 -   CBC Latest Ref Rng & Units 02/07/2020 12/26/2019 09/20/2019  WBC 4.0 - 10.5 K/uL 7.3 6.5 -  Hemoglobin 12.0 - 15.0 g/dL 14.3 14.5 14.6  Hematocrit 36.0 - 46.0 % 41.9 43.3 43.0  Platelets 150 - 400 K/uL 267 263 -   Lipid Panel     Component Value Date/Time   CHOL 180 12/26/2019 1053   TRIG 123 12/26/2019 1053   HDL 51 12/26/2019 1053   CHOLHDL 3.5 12/26/2019 1053   LDLCALC 107 (H) 12/26/2019 1053   Recent Labs    07/26/19 1041  TSH 0.74   Medications and allergies   Allergies  Allergen Reactions  . Norco [Hydrocodone-Acetaminophen] Nausea And Vomiting  . Dust Mite Extract   . Oxycontin [Oxycodone Hcl] Nausea And Vomiting  . Pollen Extract     Outpatient Medications Prior to Visit  Medication Sig Dispense Refill  . acetaminophen (TYLENOL) 325 MG tablet Take 325-650 mg by mouth every 6 (six)  hours as needed for moderate pain (for sinus headache).    Marland Kitchen azelastine (ASTELIN) 0.1 % nasal spray Place 2 sprays into both nostrils 2 (two) times daily. 30 mL 5  . clopidogrel (PLAVIX) 75 MG  tablet TAKE 1 TABLET BY MOUTH EVERY DAY 90 tablet 1  . diazepam (VALIUM) 5 MG tablet Take 0.5 tablets (2.5 mg total) by mouth as needed (anxiety). 30 tablet 0  . fluticasone (FLONASE) 50 MCG/ACT nasal spray SPRAY 2 SPRAYS IN EACH NOSTRIL AT BEDTIME 48 mL 2  . ketoconazole (NIZORAL) 2 % shampoo Apply topically.    Marland Kitchen losartan (COZAAR) 50 MG tablet TAKE 1 TABLET BY MOUTH EVERY DAY 90 tablet 0  . moxifloxacin (VIGAMOX) 0.5 % ophthalmic solution Apply to eye.    Marland Kitchen omeprazole (PRILOSEC) 40 MG capsule Take 1 capsule (40 mg total) by mouth 2 (two) times daily before a meal. 90 capsule 3  . pravastatin (PRAVACHOL) 40 MG tablet Take 1 tablet (40 mg total) by mouth at bedtime. 90 tablet 2  . sodium chloride (OCEAN) 0.65 % SOLN nasal spray Place 1 spray into both nostrils as needed for congestion.    Marland Kitchen SYNTHROID 100 MCG tablet TAKE 1 TABLET BY MOUTH DAILY BEFORE BREAKFAST. 90 tablet 1  . Zolpidem Tartrate 3.5 MG SUBL Place 3.5 mg under the tongue at bedtime as needed. 30 tablet 0  . levocetirizine (XYZAL) 5 MG tablet 1 tablet in the evening    . montelukast (SINGULAIR) 10 MG tablet Take 1 tablet (10 mg total) by mouth at bedtime. 90 tablet 1   No facility-administered medications prior to visit.    Radiology:   CXR 03/11/2019: Cardiac shadow is within normal limits. Aortic calcifications are seen. The lungs are clear. No bony abnormality is noted.  MRI brain 09/20/2019: Normal.  CT angiogram head 02/08/2020: 1. No emergent finding or specific explanation for headache. 2. Remote right occipital infarction.  Cardiac Studies:   Stress Echocardiogram June 18, 2017:  Resting EKG demonstrated normal sinus rhythm. Stress EKG is negative for myocardial ischemia, patient exercised for 4:29 minutes, achieved 6.2 Mets. Normal blood pressure response. No evidence of ischemia. Stress echocardiogram: Normal LV systolic function, no stress induced wall motion abnormality. EF 75-80%.  Nocturnal oximetry  04/18/2019: SPO2 88% for 6 minutes SPO2 less than 89% 11 minutes. Oxygen desaturation events 03/26/1940.  Desaturation index 42.  Minimum heart rate 54, maximum heart rate 89 bpm, average heart rate 60 bpm.  Percentage in bradycardia 66%.  Highest SPO2 99%, lowest SPO2 84%. Impression: Patient qualifies for nocturnal oxygen supplementation per Medicare guidelines Group 1.  Remote outpatient cardiac telemetry 04/24/2019 through 05/08/2019: Predominant rhythm is normal sinus rhythm.  Minimum heart rate 49, average heart rate 72 and maximum heart rate 195 bpm. PVC burden <1%.  There were 0 ventricular couplets and 0 ventricular runs. PAC burden <1%.  There was 1 supraventricular run of 5 beats.  No atrial fibrillation.  No heart block. 12 patient activated events correlated with sinus rhythm.  EKG  EKG 02/07/2020: Normal sinus rhythm at rate of 74 bpm, normal axis, poor R wave progression, probably normal variant.  Low-voltage.  EKG 04/12/2019: Normal sinus rhythm with rate of 75 bpm, left atrial enlargement, normal axis.  Nonspecific anterolateral T wave normality, cannot exclude ischemia.  Compared to 12/21/2017, no significant change.  Assessment     ICD-10-CM   1. Chest pain, musculoskeletal  R07.89   2. Osteoarthritis of spine with radiculopathy, cervical region  M47.22 AMB referral to orthopedics  3. Other headache syndrome  G44.89 AMB referral to orthopedics  4. Essential hypertension  I10     No orders of the defined types were placed in this encounter.   There are no discontinued medications.  Recommendations:   Darlene [name]  is a  87 y.o. Caucasian female with hypertension, hyperlipidemia, h/o sleep apnea not on CPAP, prior history of stroke in 2006 without residual defect, on Plavix chronically and there was no diagnosis of atrial fibrillation.  She has had frequent palpitations, difficulty in controlling hypertension, extreme anxiety, multiple medication intolerances.  She  is now referred back to me for evaluation of chest pain with radiation to her neck and also head.  She was recently evaluated for cranial discomfort/headache by MRI which was essentially normal except for old occipital infarct.  After review of her symptoms, I do not suspect ACS, do not suspect angina pectoris, or stiff neck and headache may be related to degenerative disc disease as patient already has had prior surgery and evaluation for lumbosacral disk disease.  I am going make a referral to orthopedics. I will see her back in a year or sooner if problems.  Overall she is feeling well and blood pressure is well controlled.    Adrian Prows, MD, [facility] 02/16/2020, 10:35 AM Office: [phone]

## 2020-02-15 NOTE — Telephone Encounter (Signed)
I followed back up with patient same day.  I got her scheduled to see Dr. Quay Burow 1/24 (same day) for ED follow up.  Patient was having an issue with her cell phone.  Patient was happy to have been scheduled with Dr. Quay Burow for same day visit.

## 2020-02-19 ENCOUNTER — Ambulatory Visit: Payer: Medicare Other | Admitting: Orthopaedic Surgery

## 2020-02-19 ENCOUNTER — Other Ambulatory Visit: Payer: Self-pay

## 2020-02-19 ENCOUNTER — Ambulatory Visit: Payer: Self-pay

## 2020-02-19 ENCOUNTER — Ambulatory Visit (INDEPENDENT_AMBULATORY_CARE_PROVIDER_SITE_OTHER): Payer: Medicare Other | Admitting: Orthopaedic Surgery

## 2020-02-19 VITALS — BP 151/78 | HR 64

## 2020-02-19 DIAGNOSIS — M542 Cervicalgia: Secondary | ICD-10-CM | POA: Diagnosis not present

## 2020-02-19 DIAGNOSIS — M4722 Other spondylosis with radiculopathy, cervical region: Secondary | ICD-10-CM

## 2020-02-19 NOTE — Progress Notes (Signed)
Office Visit Note   Patient: Darlene [name]           Date of Birth: 06-19-1933           MRN: [id number] Visit Date: 02/19/2020              Requested by: Adrian Prows, MD [place],  [place] [address] PCP: Orma Flaming, MD   Assessment & Plan: Visit Diagnoses:  1. Neck pain   2. Other spondylosis with radiculopathy, cervical region     Plan: Patient's been through therapy ,medications, now for several months without relief.  We will proceed with cervical MRI scan for evaluation.  Office follow-up after scan for review.  Follow-Up Instructions: No follow-ups on file.   Orders:  Orders Placed This Encounter  Procedures  . XR Cervical Spine 2 or 3 views  . MR Cervical Spine w/o contrast   No orders of the defined types were placed in this encounter.     Procedures: No procedures performed   Clinical Data: No additional findings.   Subjective: Chief Complaint  Patient presents with  . Other    Radiculopathy of cervical spine    HPI 87 year old female with neck pain present for almost 1 year.  She been treated with posterior headache problems and dizziness.  She had been through therapy seen by ENT surgeon.  She states she has had pain radiates in the left arm and stops just at the wrist.  Past history of distant CVA about 13 years ago and had problems with peripheral vision in her left eye otherwise no residual.  She is noted stiffness of her wrist discomfort with turning.  Imaging CT angio head with and without contrast 02/08/2020 showed remote right occipital infarct otherwise negative.  Some of her pain at the time and radiated into shoulder region and some to her chest.  Cardiac work-up was negative.  She states Dr. Einar Gip cardiologist referred her here. Review of Systems all other systems noncontributory to HPI.   Objective: Vital Signs: BP (!) 151/78   Pulse 64   Physical Exam Constitutional:      Appearance: She is well-developed.   HENT:     Head: Normocephalic.     Right Ear: External ear normal.     Left Ear: External ear normal.  Eyes:     Pupils: Pupils are equal, round, and reactive to light.  Neck:     Thyroid: No thyromegaly.     Trachea: No tracheal deviation.  Cardiovascular:     Rate and Rhythm: Normal rate.  Pulmonary:     Effort: Pulmonary effort is normal.  Abdominal:     Palpations: Abdomen is soft.  Skin:    General: Skin is warm and dry.  Neurological:     Mental Status: She is alert and oriented to person, place, and time.  Psychiatric:        Mood and Affect: Mood and affect normal.        Behavior: Behavior normal.     Ortho Exam present with brachial plexus tenderness on the left minimal on the right positive Spurling increased pain with cervical compression some improvement with distraction.  Of extremity reflexes are 2+.  No weakness biceps triceps wrist flexion extension.  No lower extremity clonus.  Specialty Comments:  No specialty comments available.  Imaging: XR Cervical Spine 2 or 3 views  Result Date: 02/19/2020 AP lateral cervical spine x-rays were obtained and reviewed.  This shows  multilevel spondylosis from C3-C7 with disc space narrowing endplate spurring.  Uncovertebral and facet degenerative changes noted. Impression: Multilevel cervical spondylosis.    PMFS History: Patient Active Problem List   Diagnosis Date Noted  . Other spondylosis with radiculopathy, cervical region 02/19/2020  . Prediabetes 01/09/2020  . Adverse reaction to COVID-19 vaccine 11/29/2019  . Chronic cough 11/24/2019  . Melanoma of skin ([place]) 08/24/2019  . Palpitations 06/07/2019  . Schatzki's ring 05/12/2019  . Hiatal hernia 05/12/2019  . Insomnia 03/29/2019  . Back pain with left-sided sciatica 03/01/2019  . Daytime sleepiness 11/30/2018  . History of colonic polyps 09/03/2018  . Dysphagia 09/03/2018  . Pyrosis 09/03/2018  . Chronic sore throat 09/03/2018  . Actinic keratoses  02/23/2018  . Seasonal and perennial allergic rhinitis 01/31/2018  . Incontinent of urine 12/01/2017  . Essential hypertension 09/06/2017  . History of CVA (cerebrovascular accident) 09/06/2017  . Hyperlipidemia 09/06/2017  . GAD (generalized anxiety disorder) 09/06/2017  . Acquired hypothyroidism 09/06/2017  . Chronic sinusitis 09/06/2017  . Gastroesophageal reflux disease 09/06/2017   Past Medical History:  Diagnosis Date  . Anxiety   . Asthma 04/06/2018  . Colon polyps   . Depression   . Hyperlipidemia   . Hypertension   . Hypothyroidism   . Skin cancer   . Sleep apnea   . Stroke [facility]) 2008    Family History  Problem Relation Age of Onset  . Lung cancer Mother 29       smoker  . Heart disease Mother   . Stroke Father 97  . Heart attack Father   . Allergic rhinitis Sister   . Hypertension Sister   . CVA Brother   . Lung cancer Maternal Grandfather   . Heart attack Paternal Grandfather   . Psoriasis Daughter   . Rheum arthritis Daughter   . Bipolar disorder Daughter   . Colon cancer Neg Hx   . Esophageal cancer Neg Hx   . Inflammatory bowel disease Neg Hx   . Liver disease Neg Hx   . Pancreatic cancer Neg Hx   . Stomach cancer Neg Hx   . Rectal cancer Neg Hx     Past Surgical History:  Procedure Laterality Date  . ADENOIDECTOMY    . APPENDECTOMY  1960  . COLONOSCOPY    . GUM SURGERY  03/2019  . [place] SURGERY  2014  . MELANOMA EXCISION  2020  . MESH APPLIED TO LAP PORT    . TONSILLECTOMY  1940   Social History   Occupational History  . Occupation: retired Radiographer, therapeutic  Tobacco Use  . Smoking status: Never Smoker  . Smokeless tobacco: Never Used  Vaping Use  . Vaping Use: Never used  Substance and Sexual Activity  . Alcohol use: Yes    Comment: hardly ever  . Drug use: Never  . Sexual activity: Not on file

## 2020-02-20 ENCOUNTER — Ambulatory Visit: Payer: Medicare Other | Admitting: Family Medicine

## 2020-02-26 ENCOUNTER — Ambulatory Visit
Admission: RE | Admit: 2020-02-26 | Discharge: 2020-02-26 | Disposition: A | Discharge status: Home / Self Care | Payer: Medicare Other | Source: Ambulatory Visit | Attending: Orthopaedic Surgery | Admitting: Orthopaedic Surgery

## 2020-02-26 ENCOUNTER — Telehealth: Payer: Self-pay

## 2020-02-26 ENCOUNTER — Other Ambulatory Visit: Payer: Self-pay

## 2020-02-26 DIAGNOSIS — M542 Cervicalgia: Secondary | ICD-10-CM

## 2020-02-26 IMAGING — MR MR CERVICAL SPINE W/O CM
5 series · 35 of 48 positions shown · non-contrast
Comparison: Cervical spine radiographs [DATE]. Brain MRI
[DATE].

CLINICAL DATA: 86-year-old female with pain radiating to the left
arm, C5 distribution. Headache, dizziness and off balance after
dental procedure 1 year ago.

EXAM:
MRI CERVICAL SPINE WITHOUT CONTRAST
TECHNIQUE: Multiplanar, multisequence MR imaging of the cervical spine was
performed. No intravenous contrast was administered.

[Series 2: T2 · sagittal · 3.0mm · 0.41mm/px · 8 of 17 slices shown (1 of 2)]
[im 1/17]
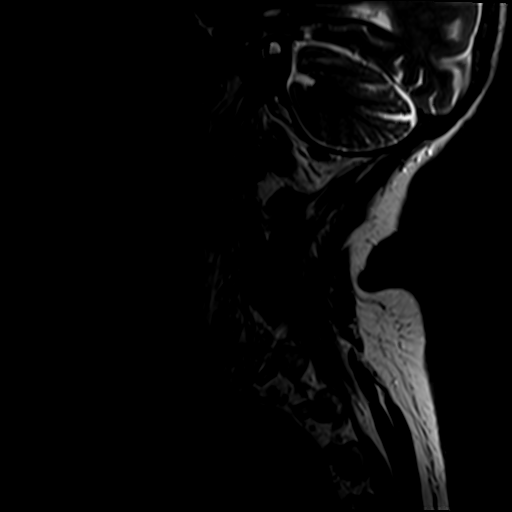
[im 3/17]
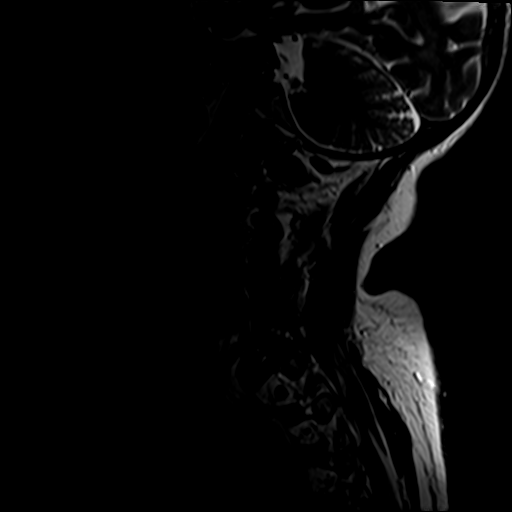
[im 5/17]
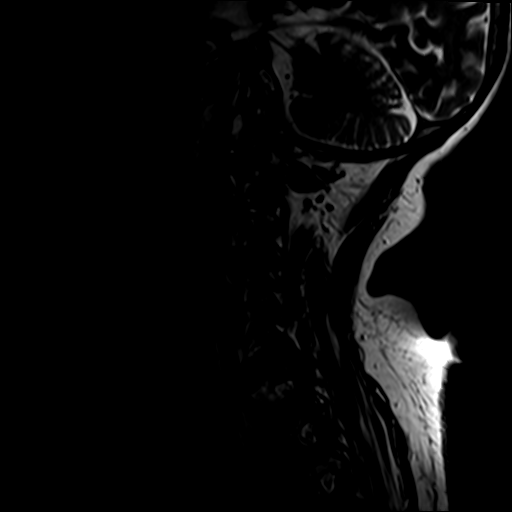
[im 7/17]
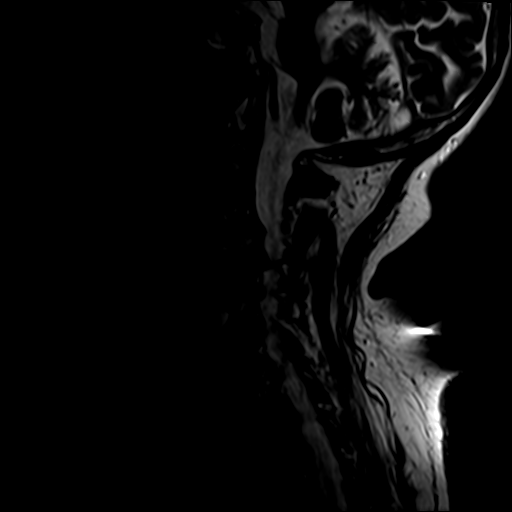
[im 10/17]
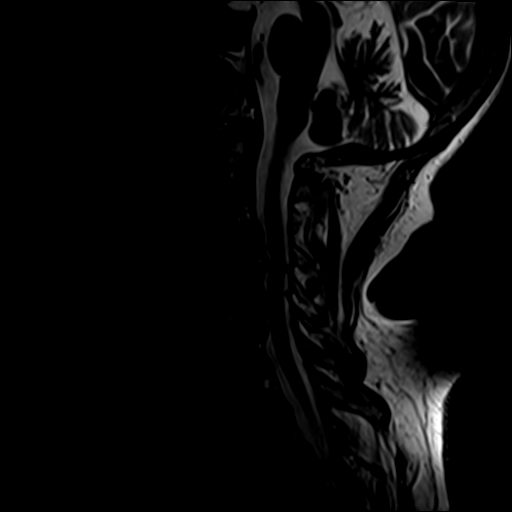
[im 12/17]
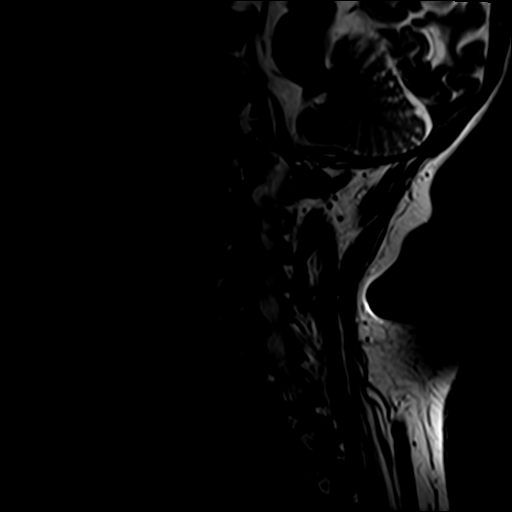
[im 14/17]
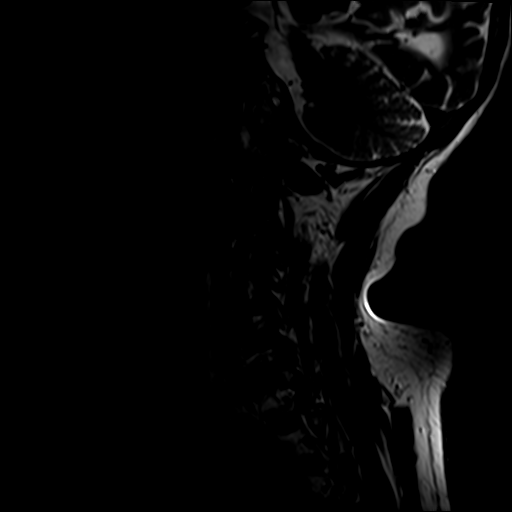
[im 17/17]
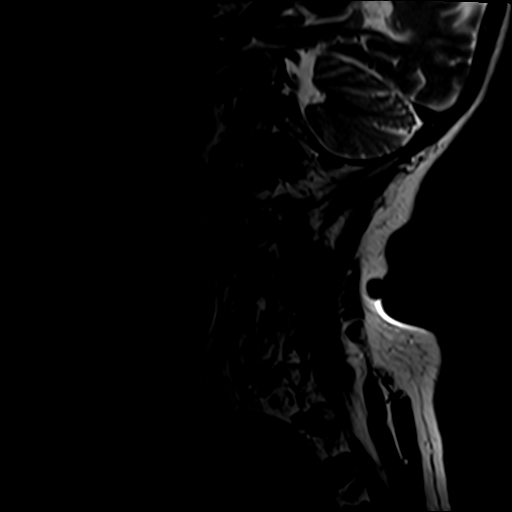

[Series 4: T1 · sagittal · 3.0mm · 0.82mm/px · 8 of 17 slices shown]
[im 1/17]
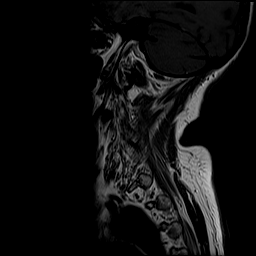
[im 3/17]
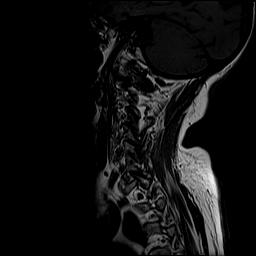
[im 5/17]
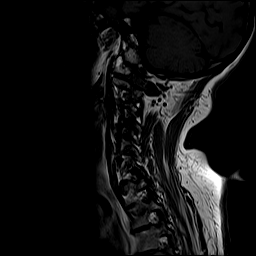
[im 7/17]
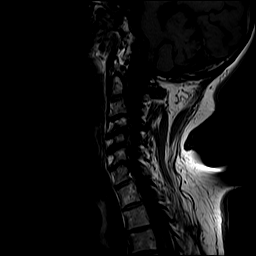
[im 10/17]
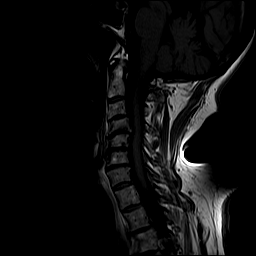
[im 12/17]
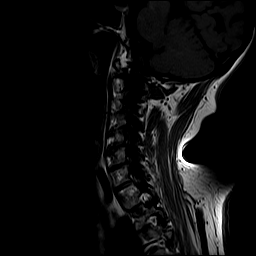
[im 14/17]
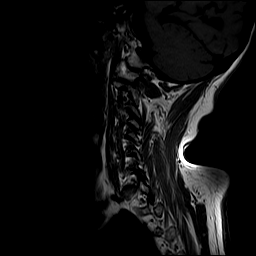
[im 17/17]
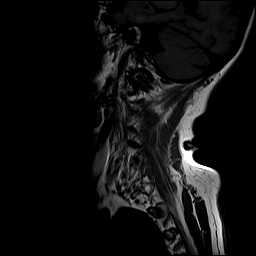

[Series 5: T2 · axial · 3.0mm · 0.70mm/px · z∈[-105,-18]mm · 9 of 25 slices shown (2 of 2)]
[im 1/25]
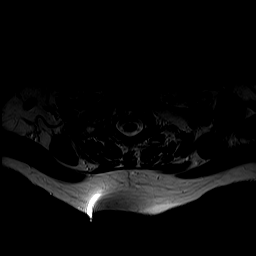
[im 5/25]
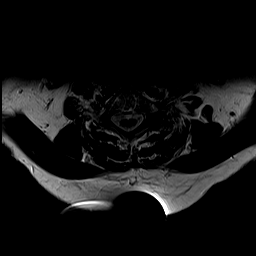
[im 7/25]
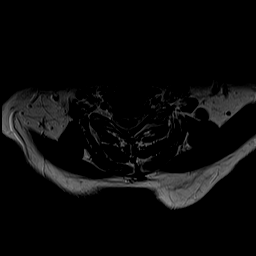
[im 11/25]
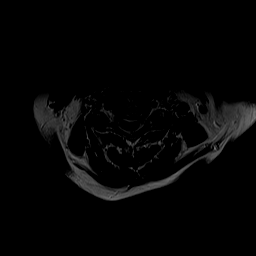
[im 14/25]
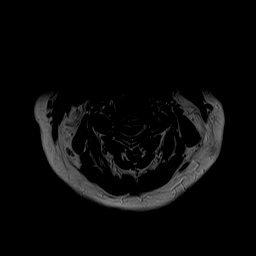
[im 18/25]
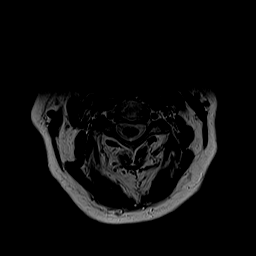
[im 20/25]
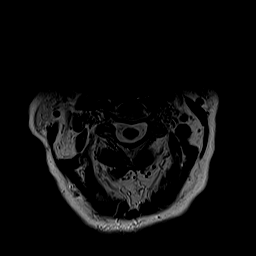
[im 22/25]
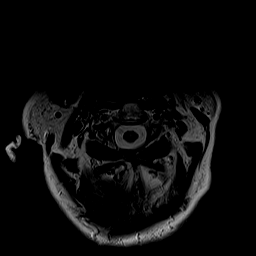
[im 25/25]
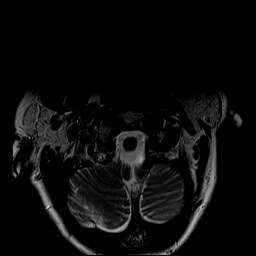

[Series 6: GRE · axial · 3.0mm · 0.35mm/px · z∈[-107,-92]mm · 2 of 26 slices shown]
[im 1/26]
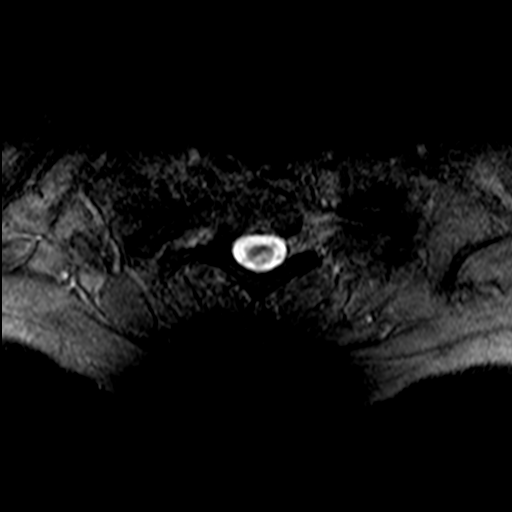
[im 5/26]
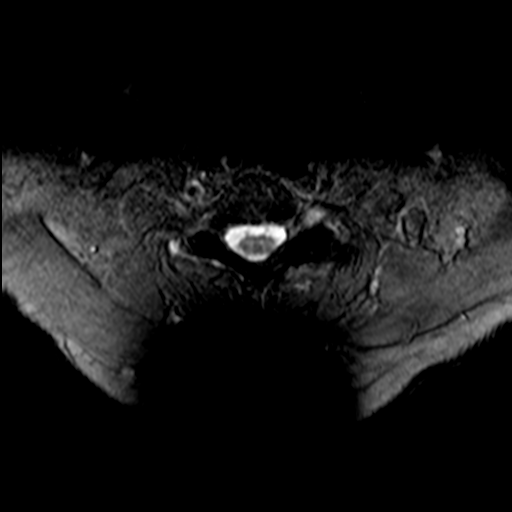

[Series 7: STIR · sagittal · 3.0mm · 0.82mm/px · 8 of 17 slices shown]
[im 1/17]
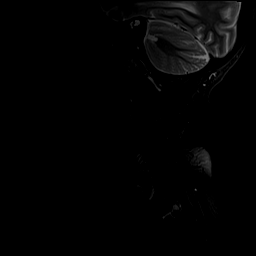
[im 3/17]
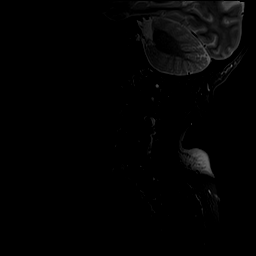
[im 5/17]
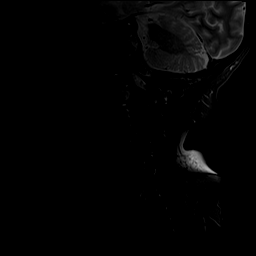
[im 7/17]
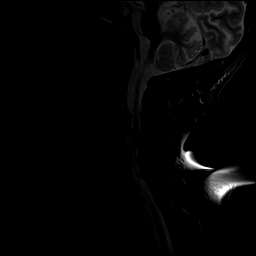
[im 10/17]
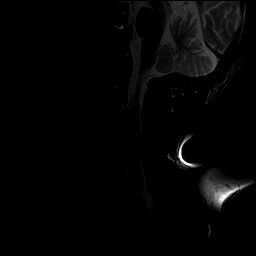
[im 12/17]
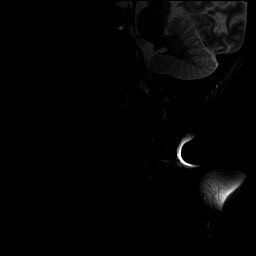
[im 14/17]
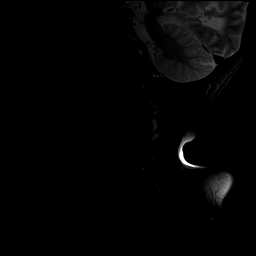
[im 17/17]
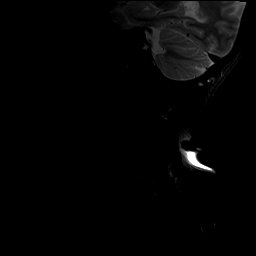

[35 of 48 positions shown; findings below may reference images not displayed]

FINDINGS: Alignment: Straightening and mild reversal of cervical lordosis is
more pronounced than on the recent radiographs. Associated subtle
retrolisthesis of C4 on C5.

Vertebrae: No marrow edema or evidence of acute osseous abnormality.
Chronic degenerative endplate marrow signal changes in the lower
cervical spine. Normal background bone marrow signal.

Cord: Cervical spinal cord signal remains normal despite
degenerative spinal stenosis detailed below. Capacious visible upper
thoracic spinal canal.

Posterior Fossa, vertebral arteries, paraspinal tissues:
Cervicomedullary junction is within normal limits. Negative visible
posterior fossa. Preserved major vascular flow voids in the neck.
Codominant vertebral arteries. Partially retropharyngeal course of
the right CCA. Negative visible neck soft tissues, lung apices.

Disc levels:

C2-C3:  Negative.

C3-C4: Disc space loss with mild disc bulging and endplate spurring.
Mild facet hypertrophy on the right. No spinal stenosis. Mild
bilateral C4 foraminal stenosis.

C4-C5: Disc space loss with circumferential disc osteophyte complex.
Mild facet and ligament flavum hypertrophy. Mild spinal stenosis. Up
to mild cord mass effect. Severe bilateral C5 foraminal stenosis.

C5-C6: Disc space loss with circumferential disc osteophyte complex.
Mild ligament flavum hypertrophy. Mild spinal stenosis. Mild if any
cord mass effect. Mild to moderate left C6 foraminal stenosis.

C6-C7: Disc space loss. Rightward circumferential disc osteophyte
complex. Mild ligament flavum hypertrophy. Borderline spinal
stenosis with no cord mass effect. Moderate left C7 foraminal
stenosis.

C7-T1: Mild posterior disc bulging. Mild facet hypertrophy. Mild
left C8 foraminal stenosis.

No visible upper thoracic spinal stenosis.
IMPRESSION: 1. No acute osseous abnormality in the cervical spine.
2. Mild multifactorial degenerative spinal stenosis C4-C5 through
C6-C7. Up to mild cord mass effect but no cord signal abnormality.
3. Associated severe degenerative foraminal stenosis at the
bilateral C5 nerve levels.
Up to moderate left C6 and left C7 nerve level foraminal stenosis.

## 2020-02-26 NOTE — Telephone Encounter (Signed)
Pt would like a phone call regarding her MRI results

## 2020-02-27 NOTE — Telephone Encounter (Signed)
I called patient and advised Dr. Lorin Mercy is out of the office this week. She states that you all discussed her going to her daughter's in [place] tomorrow and that she would need some type of treatment depending on what was found. I did explain that I could send message to you, however, I was not sure when you may be able to get back with one of [place].  Please advise.

## 2020-03-01 NOTE — Telephone Encounter (Signed)
Ucall, we can review at [facility] her scan . thanks

## 2020-03-03 ENCOUNTER — Telehealth: Payer: Self-pay | Admitting: Orthopaedic Surgery

## 2020-03-03 NOTE — Telephone Encounter (Signed)
Patient called and asked for Dr. Lorin Mercy to give a call back about MRI. Patient just stated she need to speak to Dr. Lorin Mercy Her phone number is [phone].

## 2020-03-04 ENCOUNTER — Telehealth: Payer: Self-pay | Admitting: Orthopaedic Surgery

## 2020-03-04 NOTE — Telephone Encounter (Signed)
Patient returned call to [name] Patient is asking for call back after 3 pm. Patient phone number is [phone].

## 2020-03-04 NOTE — Telephone Encounter (Signed)
Called and discussed. Has ROV in March when she is back in town

## 2020-03-04 NOTE — Telephone Encounter (Signed)
Tried calling patient,. No answer. LMVM to return call to further discuss.

## 2020-03-04 NOTE — Telephone Encounter (Signed)
See below. IC patient. Appt scheduled with you to review scan. I did advise of impression of scan but patient wishes to discuss further with you prior to her appointment

## 2020-03-04 NOTE — Telephone Encounter (Signed)
Tried calling patient to discuss previous note. No answer. LMVM for patient to call back to further discuss.

## 2020-03-05 ENCOUNTER — Ambulatory Visit: Payer: Medicare Other | Admitting: Orthopaedic Surgery

## 2020-03-08 ENCOUNTER — Other Ambulatory Visit: Payer: Medicare Other

## 2020-03-16 ENCOUNTER — Other Ambulatory Visit: Payer: Self-pay | Admitting: Nurse Practitioner

## 2020-03-18 ENCOUNTER — Other Ambulatory Visit: Payer: Self-pay

## 2020-03-18 ENCOUNTER — Encounter: Payer: Self-pay | Admitting: Orthopaedic Surgery

## 2020-03-18 ENCOUNTER — Ambulatory Visit (INDEPENDENT_AMBULATORY_CARE_PROVIDER_SITE_OTHER): Payer: Medicare Other | Admitting: Orthopaedic Surgery

## 2020-03-18 VITALS — BP 148/80 | HR 74 | Ht 63.0 in | Wt 162.0 lb

## 2020-03-18 DIAGNOSIS — M4722 Other spondylosis with radiculopathy, cervical region: Secondary | ICD-10-CM | POA: Diagnosis not present

## 2020-03-18 NOTE — Progress Notes (Signed)
Office Visit Note   Patient: Darlene [name]           Date of Birth: February 27, 1933           MRN: [id number] Visit Date: 03/18/2020              Requested by: Orma Flaming, [address],  [place] [address] PCP: Orma Flaming, MD   Assessment & Plan: Visit Diagnoses:  1. Other spondylosis with radiculopathy, cervical region     Plan: MRI scan shows some mild stenosis C4-5 through C6-7.  Some foraminal stenosis is present.  We discussed surgical options if her symptoms progress.  Currently she not having any radicular symptoms down into her hands.  We can follow her up in 4 months for recheck she can return earlier if she has recurrent symptoms.  I gave her a copy of the report reviewed the images discussed pathophysiology.  Follow-Up Instructions: Return in about 4 months (around 07/18/2020).   Orders:  No orders of the defined types were placed in this encounter.  No orders of the defined types were placed in this encounter.     Procedures: No procedures performed   Clinical Data: No additional findings.   Subjective: Chief Complaint  Patient presents with  . Neck - Follow-up    MRI cervical spine review    HPI patient returns she states her symptoms with pain in areas of her head or not as frequent.  Most of her symptoms been on the right side recently on the left.  Previous history of CVA.  Cervical MRI scan has been obtained and is available for review today.  Review of Systems 14 point update unchanged from 02/19/2020 office visit.   Objective: Vital Signs: BP (!) 148/80   Pulse 74   Ht 5\' 3"  (1.6 m)   Wt 162 lb (73.5 kg)   BMI 28.70 kg/m   Physical Exam Constitutional:      Appearance: She is well-developed.  HENT:     Head: Normocephalic.     Right Ear: External ear normal.     Left Ear: External ear normal.  Eyes:     Pupils: Pupils are equal, round, and reactive to light.  Neck:     Thyroid: No thyromegaly.     Trachea: No tracheal  deviation.  Cardiovascular:     Rate and Rhythm: Normal rate.  Pulmonary:     Effort: Pulmonary effort is normal.  Abdominal:     Palpations: Abdomen is soft.  Skin:    General: Skin is warm and dry.  Neurological:     Mental Status: She is alert and oriented to person, place, and time.  Psychiatric:        Mood and Affect: Mood and affect normal.        Behavior: Behavior normal.     Ortho Exam some brachial plexus tenderness.  Some discomfort cervical range of motion.  Normal heel toe gait.  No clonus. Specialty Comments:  No specialty comments available.  Imaging: CLINICAL DATA:  87 year old female with pain radiating to the left arm, C5 distribution. Headache, dizziness and off balance after dental procedure 1 year ago.  EXAM: MRI CERVICAL SPINE WITHOUT CONTRAST  TECHNIQUE: Multiplanar, multisequence MR imaging of the cervical spine was performed. No intravenous contrast was administered.  COMPARISON:  Cervical spine radiographs 02/19/2020. Brain MRI 09/20/2019.  FINDINGS: Alignment: Straightening and mild reversal of cervical lordosis is more pronounced than on the recent radiographs. Associated subtle  retrolisthesis of C4 on C5.  Vertebrae: No marrow edema or evidence of acute osseous abnormality. Chronic degenerative endplate marrow signal changes in the lower cervical spine. Normal background bone marrow signal.  Cord: Cervical spinal cord signal remains normal despite degenerative spinal stenosis detailed below. Capacious visible upper thoracic spinal canal.  Posterior Fossa, vertebral arteries, paraspinal tissues: Cervicomedullary junction is within normal limits. Negative visible posterior fossa. Preserved major vascular flow voids in the neck. Codominant vertebral arteries. Partially retropharyngeal course of the right CCA. Negative visible neck soft tissues, lung apices.  Disc levels:  C2-C3:  Negative.  C3-C4: Disc space loss with  mild disc bulging and endplate spurring. Mild facet hypertrophy on the right. No spinal stenosis. Mild bilateral C4 foraminal stenosis.  C4-C5: Disc space loss with circumferential disc osteophyte complex. Mild facet and ligament flavum hypertrophy. Mild spinal stenosis. Up to mild cord mass effect. Severe bilateral C5 foraminal stenosis.  C5-C6: Disc space loss with circumferential disc osteophyte complex. Mild ligament flavum hypertrophy. Mild spinal stenosis. Mild if any cord mass effect. Mild to moderate left C6 foraminal stenosis.  C6-C7: Disc space loss. Rightward circumferential disc osteophyte complex. Mild ligament flavum hypertrophy. Borderline spinal stenosis with no cord mass effect. Moderate left C7 foraminal stenosis.  C7-T1: Mild posterior disc bulging. Mild facet hypertrophy. Mild left C8 foraminal stenosis.  No visible upper thoracic spinal stenosis.  IMPRESSION: 1. No acute osseous abnormality in the cervical spine. 2. Mild multifactorial degenerative spinal stenosis C4-C5 through C6-C7. Up to mild cord mass effect but no cord signal abnormality. 3. Associated severe degenerative foraminal stenosis at the bilateral C5 nerve levels. Up to moderate left C6 and left C7 nerve level foraminal stenosis.   Electronically Signed   By: Genevie Ann M.D.   On: 02/26/2020 10:42   PMFS History: Patient Active Problem List   Diagnosis Date Noted  . Other spondylosis with radiculopathy, cervical region 02/19/2020  . Prediabetes 01/09/2020  . Adverse reaction to COVID-19 vaccine 11/29/2019  . Chronic cough 11/24/2019  . Melanoma of skin ([place]) 08/24/2019  . Palpitations 06/07/2019  . Schatzki's ring 05/12/2019  . Hiatal hernia 05/12/2019  . Insomnia 03/29/2019  . Back pain with left-sided sciatica 03/01/2019  . Daytime sleepiness 11/30/2018  . History of colonic polyps 09/03/2018  . Dysphagia 09/03/2018  . Pyrosis 09/03/2018  . Chronic sore throat 09/03/2018   . Actinic keratoses 02/23/2018  . Seasonal and perennial allergic rhinitis 01/31/2018  . Incontinent of urine 12/01/2017  . Essential hypertension 09/06/2017  . History of CVA (cerebrovascular accident) 09/06/2017  . Hyperlipidemia 09/06/2017  . GAD (generalized anxiety disorder) 09/06/2017  . Acquired hypothyroidism 09/06/2017  . Chronic sinusitis 09/06/2017  . Gastroesophageal reflux disease 09/06/2017   Past Medical History:  Diagnosis Date  . Anxiety   . Asthma 04/06/2018  . Colon polyps   . Depression   . Hyperlipidemia   . Hypertension   . Hypothyroidism   . Skin cancer   . Sleep apnea   . Stroke [facility]) 2008    Family History  Problem Relation Age of Onset  . Lung cancer Mother 40       smoker  . Heart disease Mother   . Stroke Father 19  . Heart attack Father   . Allergic rhinitis Sister   . Hypertension Sister   . CVA Brother   . Lung cancer Maternal Grandfather   . Heart attack Paternal Grandfather   . Psoriasis Daughter   . Rheum arthritis Daughter   .  Bipolar disorder Daughter   . Colon cancer Neg Hx   . Esophageal cancer Neg Hx   . Inflammatory bowel disease Neg Hx   . Liver disease Neg Hx   . Pancreatic cancer Neg Hx   . Stomach cancer Neg Hx   . Rectal cancer Neg Hx     Past Surgical History:  Procedure Laterality Date  . ADENOIDECTOMY    . APPENDECTOMY  1960  . COLONOSCOPY    . GUM SURGERY  03/2019  . [place] SURGERY  2014  . MELANOMA EXCISION  2020  . MESH APPLIED TO LAP PORT    . TONSILLECTOMY  1940   Social History   Occupational History  . Occupation: retired Radiographer, therapeutic  Tobacco Use  . Smoking status: Never Smoker  . Smokeless tobacco: Never Used  Vaping Use  . Vaping Use: Never used  Substance and Sexual Activity  . Alcohol use: Yes    Comment: hardly ever  . Drug use: Never  . Sexual activity: Not on file

## 2020-03-21 ENCOUNTER — Ambulatory Visit (INDEPENDENT_AMBULATORY_CARE_PROVIDER_SITE_OTHER): Payer: Medicare Other | Admitting: Otolaryngology

## 2020-03-21 ENCOUNTER — Other Ambulatory Visit: Payer: Self-pay

## 2020-03-21 ENCOUNTER — Encounter (INDEPENDENT_AMBULATORY_CARE_PROVIDER_SITE_OTHER): Payer: Self-pay | Admitting: Otolaryngology

## 2020-03-21 VITALS — Temp 97.3°F

## 2020-03-21 DIAGNOSIS — J310 Chronic rhinitis: Secondary | ICD-10-CM | POA: Diagnosis not present

## 2020-03-21 DIAGNOSIS — J3481 Nasal mucositis (ulcerative): Secondary | ICD-10-CM

## 2020-03-21 NOTE — Progress Notes (Signed)
HPI: Darlene [name] is a 87 y.o. female who returns today for evaluation of nasal congestion especially on the right side where she gets a lot of phlegm and crusting in the right nostril. She lives in an older building and thinks that her exposure in her house is contributing some to this. When she was staying with her daughter in [place] her breathing was much better. She has had a little bloody mucus from the right side. She still has dizziness. She has previously visited with vestibular rehab. She is presently using Flonase daily. She also uses saline rinses..  Past Medical History:  Diagnosis Date  . Anxiety   . Asthma 04/06/2018  . Colon polyps   . Depression   . Hyperlipidemia   . Hypertension   . Hypothyroidism   . Skin cancer   . Sleep apnea   . Stroke [facility]) 2008   Past Surgical History:  Procedure Laterality Date  . ADENOIDECTOMY    . APPENDECTOMY  1960  . COLONOSCOPY    . GUM SURGERY  03/2019  . [place] SURGERY  2014  . MELANOMA EXCISION  2020  . MESH APPLIED TO LAP PORT    . TONSILLECTOMY  1940   Social History   Socioeconomic History  . Marital status: Widowed    Spouse name: Not on file  . Number of children: 3  . Years of education: Not on file  . Highest education level: Not on file  Occupational History  . Occupation: retired Radiographer, therapeutic  Tobacco Use  . Smoking status: Never Smoker  . Smokeless tobacco: Never Used  Vaping Use  . Vaping Use: Never used  Substance and Sexual Activity  . Alcohol use: Yes    Comment: hardly ever  . Drug use: Never  . Sexual activity: Not on file  Other Topics Concern  . Not on file  Social History Narrative   Social History      Diet? ok      Do you drink/eat things with caffeine? Weak coffee, (much milk)      Marital status?          divorced                          What year were you married? 1961      Do you live in a house, apartment, assisted living, condo, trailer, etc.?  apartment      Is it one or more stories? 4      How many persons live in your home? Only me      Do you have any pets in your home? (please list) no      Highest level of education completed? 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             yes                         Type & how often? Walk- would love to return to water exercise      Advanced Directives      Do you have a living will? yes      Do you have a DNR form?           no  If not, do you want to discuss one? no      Do you have signed POA/HPOA for forms? no      Functional Status      Do you have difficulty bathing or dressing yourself?  no      Do you have difficulty preparing food or eating? no      Do you have difficulty managing your medications? no      Do you have difficulty managing your finances?  no      Do you have difficulty affording your medications? no      Social Determinants of Health   Financial Resource Strain: Not on file  Food Insecurity: Not on file  Transportation Needs: Not on file  Physical Activity: Not on file  Stress: Not on file  Social Connections: Not on file   Family History  Problem Relation Age of Onset  . Lung cancer Mother 59       smoker  . Heart disease Mother   . Stroke Father 41  . Heart attack Father   . Allergic rhinitis Sister   . Hypertension Sister   . CVA Brother   . Lung cancer Maternal Grandfather   . Heart attack Paternal Grandfather   . Psoriasis Daughter   . Rheum arthritis Daughter   . Bipolar disorder Daughter   . Colon cancer Neg Hx   . Esophageal cancer Neg Hx   . Inflammatory bowel disease Neg Hx   . Liver disease Neg Hx   . Pancreatic cancer Neg Hx   . Stomach cancer Neg Hx   . Rectal cancer Neg Hx    Allergies  Allergen Reactions  . Norco [Hydrocodone-Acetaminophen] Nausea And Vomiting  . Dust Mite Extract   . Oxycontin [Oxycodone Hcl]  Nausea And Vomiting  . Pollen Extract    Prior to Admission medications   Medication Sig Start Date End Date Taking? Authorizing Provider  acetaminophen (TYLENOL) 325 MG tablet Take 325-650 mg by mouth every 6 (six) hours as needed for moderate pain (for sinus headache).    [provider]  azelastine (ASTELIN) 0.1 % nasal spray Place 2 sprays into both nostrils 2 (two) times daily. 01/31/18   Valentina Shaggy, MD  clopidogrel (PLAVIX) 75 MG tablet TAKE 1 TABLET BY MOUTH EVERY DAY 11/20/19   Virgie Dad, MD  diazepam (VALIUM) 5 MG tablet Take 0.5 tablets (2.5 mg total) by mouth as needed (anxiety). 11/26/19   Orma Flaming, MD  fluticasone [facility]) 50 MCG/ACT nasal spray SPRAY 2 SPRAYS IN EACH NOSTRIL AT BEDTIME 12/25/19   Rozetta Nunnery, MD  ketoconazole (NIZORAL) 2 % shampoo Apply topically. 01/01/20   [provider]  levocetirizine (XYZAL) 5 MG tablet 1 tablet in the evening 05/21/19   [provider]  losartan (COZAAR) 50 MG tablet TAKE 1 TABLET BY MOUTH EVERY DAY 03/17/20   [name], [name], NP  montelukast (SINGULAIR) 10 MG tablet Take 1 tablet (10 mg total) by mouth at bedtime. 11/26/19   Orma Flaming, MD  moxifloxacin (VIGAMOX) 0.5 % ophthalmic solution Apply to eye. 12/31/19   [provider]  omeprazole (PRILOSEC) 40 MG capsule Take 1 capsule (40 mg total) by mouth 2 (two) times daily before a meal. 11/23/19   Mansouraty, Telford Nab., MD  pravastatin (PRAVACHOL) 40 MG tablet Take 1 tablet (40 mg total) by mouth at bedtime. 09/26/19   Adrian Prows, MD  sodium chloride (OCEAN) 0.65 % SOLN nasal spray Place 1 spray  into both nostrils as needed for congestion.    [provider]  SYNTHROID 100 MCG tablet TAKE 1 TABLET BY MOUTH DAILY BEFORE BREAKFAST. 10/24/19   Virgie Dad, MD  Zolpidem Tartrate 3.5 MG SUBL Place 3.5 mg under the tongue at bedtime as needed. 12/31/19   [name], [name], NP     Positive ROS: Otherwise negative  All other  systems have been reviewed and were otherwise negative with the exception of those mentioned in the HPI and as above.  Physical Exam: Constitutional: Alert, well-appearing, no acute distress Ears: External ears without lesions or tenderness. She had little wax buildup in the ears that was cleaned with suction. Otherwise the ear canals and TMs were clear bilaterally with no middle ear effusion noted. Nasal: External nose without lesions. Septum relatively midline but she has some scabbing and crusting along the right side of the septum. She uses her finger to clean the scabbing and crusting which is causing some mild mucositis within the right nostril. After decongesting the nose suction was used to clean the mucus from the nose and both middle meatus regions were clear with no clinical evidence of mucopurulent discharge. She had no polyps no obstructing lesions otherwise in the nose..  Oral: Lips and gums without lesions. Tongue and palate mucosa without lesions. Posterior oropharynx clear. Neck: No palpable adenopathy or masses Respiratory: Breathing comfortably  Skin: No facial/neck lesions or rash noted.  Procedures  Assessment: Chronic rhinitis with scabbing and crusting along the right side of septum.  Plan: Discussed with her concerning not picking the scabbing or crusting from the nose. Would use the Flonase regularly as she is presently doing. Also recommended using saline irrigation or saline irrigator. Suggested also trying Xlear brand of saline rinse. Gave her a prescription for mupirocin 2% ointment to apply to the right side of the nose twice daily for 5 to 7 days. Also gave her information for the nasal gel spray if she gets a lot of scabbing or crusting in the nose. She will follow-up as needed.   Radene Journey, MD

## 2020-04-04 ENCOUNTER — Other Ambulatory Visit: Payer: Self-pay | Admitting: Cardiology

## 2020-04-04 ENCOUNTER — Other Ambulatory Visit: Payer: Self-pay

## 2020-04-04 ENCOUNTER — Ambulatory Visit: Payer: Medicare Other | Admitting: Cardiology

## 2020-04-04 VITALS — BP 138/75 | HR 72 | Resp 17 | Ht 63.0 in | Wt 160.0 lb

## 2020-04-04 DIAGNOSIS — E7800 Pure hypercholesterolemia, unspecified: Secondary | ICD-10-CM

## 2020-04-04 DIAGNOSIS — I10 Essential (primary) hypertension: Secondary | ICD-10-CM

## 2020-04-04 DIAGNOSIS — R0789 Other chest pain: Secondary | ICD-10-CM

## 2020-04-04 DIAGNOSIS — I201 Angina pectoris with documented spasm: Secondary | ICD-10-CM

## 2020-04-04 MED ORDER — NITROGLYCERIN 0.4 MG SL SUBL: 0.4000 mg | SUBLINGUAL_TABLET | SUBLINGUAL | 3 refills | Status: DC | PRN

## 2020-04-04 MED ORDER — ISOSORBIDE MONONITRATE ER 30 MG PO TB24
30.0000 mg | ORAL_TABLET | Freq: Every day | ORAL | 2 refills | Status: DC
Start: 2020-04-04 — End: 2020-05-16

## 2020-04-04 NOTE — Progress Notes (Signed)
Primary Physician/Referring:  Orma Flaming, MD  Patient ID: Darlene [name], female    DOB: [birth year], 87 y.o.   MRN: [id number]  Chief Complaint  Patient presents with  . Chest Pain   HPI:    Darlene [name]  is a 87 y.o. Caucasian female with hypertension, hyperlipidemia, h/o sleep apnea not on CPAP, prior history of stroke in 2006 without residual defect, on Plavix chronically. She has had frequent palpitations, difficulty in controlling hypertension, extreme anxiety, multiple medication intolerances.  However with the present combination of medications, blood pressure has been well controlled and at last seen her in January 2022.  She walked into office stating "last night had a heart attack".  Chest pain symptoms very in the form of chest tightness with radiation to her jaw and to the right side of her neck.  Symptoms lasted for about 30 minutes to an hour, symptoms were waxing and waning.   She was  evaluated January 25 2 for cranial discomfort/headache by MRI which was essentially normal except for old occipital infarct.   Patient states that over the past few weeks she has had initially pain along the front of the chest then along the arms and also to the neck and now radiates all the way to the cranium.  She has not had any further chest discomfort but continues to have neck pain and stiff neck and headache.  She continues to remain active and no exertional chest pain or dyspnea.  Past Medical History:  Diagnosis Date  . Anxiety   . Asthma 04/06/2018  . Colon polyps   . Depression   . Hyperlipidemia   . Hypertension   . Hypothyroidism   . Skin cancer   . Sleep apnea   . Stroke [facility]) 2008   Past Surgical History:  Procedure Laterality Date  . ADENOIDECTOMY    . APPENDECTOMY  1960  . CATARACT EXTRACTION Left   . COLONOSCOPY    . GUM SURGERY  03/2019  . [place] SURGERY  2014  . MELANOMA EXCISION  2020  . MESH APPLIED TO LAP PORT    . TONSILLECTOMY  1940   Family  History  Problem Relation Age of Onset  . Lung cancer Mother 27       smoker  . Heart disease Mother   . Stroke Father 60  . Heart attack Father   . Allergic rhinitis Sister   . Hypertension Sister   . CVA Brother   . Lung cancer Maternal Grandfather   . Heart attack Paternal Grandfather   . Psoriasis Daughter   . Rheum arthritis Daughter   . Bipolar disorder Daughter   . Colon cancer Neg Hx   . Esophageal cancer Neg Hx   . Inflammatory bowel disease Neg Hx   . Liver disease Neg Hx   . Pancreatic cancer Neg Hx   . Stomach cancer Neg Hx   . Rectal cancer Neg Hx     Social History   Tobacco Use  . Smoking status: Never Smoker  . Smokeless tobacco: Never Used  Substance Use Topics  . Alcohol use: Yes    Comment: hardly ever   ROS   Review of Systems  Cardiovascular: Positive for chest pain and dyspnea on exertion. Negative for leg swelling.  Musculoskeletal: Positive for arthritis, joint pain and neck pain.  Gastrointestinal: Negative for melena.  Psychiatric/Behavioral: The patient is nervous/anxious.      Objective  Blood pressure 138/75, pulse 72, resp. rate 17, height  5\' 3"  (1.6 m), SpO2 94 %.  Vitals with BMI 04/04/2020 03/18/2020 02/19/2020  Height 5\' 3"  5\' 3"  -  Weight - 162 lbs -  BMI - 66.0 -  Systolic 630 160 109  Diastolic 75 80 78  Pulse 72 74 64  Physical Exam Constitutional:      Appearance: Normal appearance.  Cardiovascular:     Rate and Rhythm: Normal rate and regular rhythm.     Pulses: Normal pulses and intact distal pulses.          Radial pulses are 2+ on the right side and 2+ on the left side.     Heart sounds: Normal heart sounds. No murmur heard. No gallop.      Comments: No leg edema, no JVD. Pulmonary:     Effort: Pulmonary effort is normal.     Breath sounds: Normal breath sounds.  Abdominal:     General: Bowel sounds are normal.     Palpations: Abdomen is soft.  Musculoskeletal:        General: Normal range of motion.  Skin:     General: Skin is warm and dry.  Neurological:     Mental Status: She is alert.     Laboratory examination:   Recent Labs    04/07/19 2048 08/24/19 1540 09/20/19 1944 09/20/19 1956 12/26/19 1053 02/07/20 1756  NA 140   < > 142 143 142 142  K 3.8   < > 3.7 4.3 4.3 3.8  CL 107   < > 106 108 105 109  CO2 23   < > 26  --  31 23  GLUCOSE 122*   < > 130* 126* 114* 115*  BUN 12   < > 16 15 14 15   CREATININE 0.80   < > 0.71 0.70 0.80 0.78  CALCIUM 9.6   < > 9.4  --  10.0 9.5  GFRNONAA >60  --  >60  --  68 >60  GFRAA >60  --  >60  --  78  --    < > = values in this interval not displayed.   CrCl cannot be calculated (Patient's most recent lab result is older than the maximum 21 days allowed.).  CMP Latest Ref Rng & Units 02/07/2020 12/26/2019 09/20/2019  Glucose 70 - 99 mg/dL 115(H) 114(H) 126(H)  BUN 8 - 23 mg/dL 15 14 15   Creatinine 0.44 - 1.00 mg/dL 0.78 0.80 0.70  Sodium 135 - 145 mmol/L 142 142 143  Potassium 3.5 - 5.1 mmol/L 3.8 4.3 4.3  Chloride 98 - 111 mmol/L 109 105 108  CO2 22 - 32 mmol/L 23 31 -  Calcium 8.9 - 10.3 mg/dL 9.5 10.0 -  Total Protein 6.1 - 8.1 g/dL - 7.0 -  Total Bilirubin 0.2 - 1.2 mg/dL - 0.5 -  Alkaline Phos 38 - 126 U/L - - -  AST 10 - 35 U/L - 16 -  ALT 6 - 29 U/L - 13 -   CBC Latest Ref Rng & Units 02/07/2020 12/26/2019 09/20/2019  WBC 4.0 - 10.5 K/uL 7.3 6.5 -  Hemoglobin 12.0 - 15.0 g/dL 14.3 14.5 14.6  Hematocrit 36.0 - 46.0 % 41.9 43.3 43.0  Platelets 150 - 400 K/uL 267 263 -   Lipid Panel     Component Value Date/Time   CHOL 180 12/26/2019 1053   TRIG 123 12/26/2019 1053   HDL 51 12/26/2019 1053   CHOLHDL 3.5 12/26/2019 1053   LDLCALC 107 (H) 12/26/2019 1053  Recent Labs    07/26/19 1041  TSH 0.74   Medications and allergies   Allergies  Allergen Reactions  . Norco [Hydrocodone-Acetaminophen] Nausea And Vomiting  . Dust Mite Extract   . Oxycontin [Oxycodone Hcl] Nausea And Vomiting  . Pollen Extract     Outpatient Medications  Prior to Visit  Medication Sig Dispense Refill  . acetaminophen (TYLENOL) 325 MG tablet Take 325-650 mg by mouth every 6 (six) hours as needed for moderate pain (for sinus headache).    Marland Kitchen azelastine (ASTELIN) 0.1 % nasal spray Place 2 sprays into both nostrils 2 (two) times daily. 30 mL 5  . clopidogrel (PLAVIX) 75 MG tablet TAKE 1 TABLET BY MOUTH EVERY DAY 90 tablet 1  . diazepam (VALIUM) 5 MG tablet Take 0.5 tablets (2.5 mg total) by mouth as needed (anxiety). 30 tablet 0  . fluticasone (FLONASE) 50 MCG/ACT nasal spray SPRAY 2 SPRAYS IN EACH NOSTRIL AT BEDTIME 48 mL 2  . ketoconazole (NIZORAL) 2 % shampoo Apply topically.    Marland Kitchen losartan (COZAAR) 50 MG tablet TAKE 1 TABLET BY MOUTH EVERY DAY 90 tablet 0  . moxifloxacin (VIGAMOX) 0.5 % ophthalmic solution Apply to eye.    Marland Kitchen omeprazole (PRILOSEC) 40 MG capsule Take 1 capsule (40 mg total) by mouth 2 (two) times daily before a meal. 90 capsule 3  . oxybutynin (DITROPAN-XL) 10 MG 24 hr tablet Take 10 mg by mouth daily.    . pravastatin (PRAVACHOL) 40 MG tablet Take 1 tablet (40 mg total) by mouth at bedtime. 90 tablet 2  . sodium chloride (OCEAN) 0.65 % SOLN nasal spray Place 1 spray into both nostrils as needed for congestion.    Marland Kitchen SYNTHROID 100 MCG tablet TAKE 1 TABLET BY MOUTH DAILY BEFORE BREAKFAST. 90 tablet 1  . Zolpidem Tartrate 3.5 MG SUBL Place 3.5 mg under the tongue at bedtime as needed. 30 tablet 0  . levocetirizine (XYZAL) 5 MG tablet 1 tablet in the evening    . montelukast (SINGULAIR) 10 MG tablet Take 1 tablet (10 mg total) by mouth at bedtime. 90 tablet 1   No facility-administered medications prior to visit.    Radiology:   CXR 03/11/2019: Cardiac shadow is within normal limits. Aortic calcifications are seen. The lungs are clear. No bony abnormality is noted.  MRI brain 09/20/2019: Normal.  CT angiogram head 02/08/2020: 1. No emergent finding or specific explanation for headache. 2. Remote right occipital infarction.  MR  cervical spine 02/26/2020: 1. No acute osseous abnormality in the cervical spine. 2. Mild multifactorial degenerative spinal stenosis C4-C5 through C6-C7. Very mild cord mass effect but no cord signal abnormality. 3. Associated severe degenerative foraminal stenosis at the bilateral C5 nerve levels.  Up to moderate left C6 and left C7 nerve level foraminal stenosis  Cardiac Studies:   Stress Echocardiogram 06-23-2017:  Resting EKG demonstrated normal sinus rhythm. Stress EKG is negative for myocardial ischemia, patient exercised for 4:29 minutes, achieved 6.2 Mets. Normal blood pressure response. No evidence of ischemia. Stress echocardiogram: Normal LV systolic function, no stress induced wall motion abnormality. EF 75-80%.  Nocturnal oximetry 04/18/2019: SPO2 88% for 6 minutes SPO2 less than 89% 11 minutes. Oxygen desaturation events 03/26/1940.  Desaturation index 42.  Minimum heart rate 54, maximum heart rate 89 bpm, average heart rate 60 bpm.  Percentage in bradycardia 66%.  Highest SPO2 99%, lowest SPO2 84%. Impression: Patient qualifies for nocturnal oxygen supplementation per Medicare guidelines Group 1.  Remote outpatient cardiac telemetry 04/24/2019 through  05/08/2019: Predominant rhythm is normal sinus rhythm.  Minimum heart rate 49, average heart rate 72 and maximum heart rate 195 bpm. PVC burden <1%.  There were 0 ventricular couplets and 0 ventricular runs. PAC burden <1%.  There was 1 supraventricular run of 5 beats.  No atrial fibrillation.  No heart block. 12 patient activated events correlated with sinus rhythm.  EKG  EKG 04/04/2020: Normal sinus rhythm at rate of 69 bpm, normal axis.  Incomplete right bundle branch block.  Nonspecific T abnormality.    EKG 02/07/2020: Normal sinus rhythm at rate of 74 bpm, normal axis, poor R wave progression, probably normal variant.  Low-voltage.  EKG 04/12/2019: Normal sinus rhythm with rate of 75 bpm, left atrial enlargement,  normal axis.  Nonspecific anterolateral T wave normality, cannot exclude ischemia.  Compared to 12/21/2017, no significant change.  Assessment     ICD-10-CM   1. Prinzmetal's angina (HCC)  I20.1 EKG 12-Lead    Troponin I (High Sensitivity)    nitroGLYCERIN (NITROSTAT) 0.4 MG SL tablet    isosorbide mononitrate (IMDUR) 30 MG 24 hr tablet    PCV ECHOCARDIOGRAM COMPLETE    PCV MYOCARDIAL PERFUSION WO LEXISCAN  2. Atypical chest pain  R07.89 Troponin I (High Sensitivity)    PCV ECHOCARDIOGRAM COMPLETE    PCV MYOCARDIAL PERFUSION WO LEXISCAN  3. Essential hypertension  I10   4. Pure hypercholesterolemia  E78.00     Meds ordered this encounter  Medications  . nitroGLYCERIN (NITROSTAT) 0.4 MG SL tablet    Sig: Place 1 tablet (0.4 mg total) under the tongue every 5 (five) minutes as needed for up to 25 days for chest pain.    Dispense:  25 tablet    Refill:  3  . isosorbide mononitrate (IMDUR) 30 MG 24 hr tablet    Sig: Take 1 tablet (30 mg total) by mouth daily.    Dispense:  30 tablet    Refill:  2    Medications Discontinued During This Encounter  Medication Reason  . levocetirizine (XYZAL) 5 MG tablet Error  . montelukast (SINGULAIR) 10 MG tablet Error    Orders Placed This Encounter  Procedures  . PCV MYOCARDIAL PERFUSION WO LEXISCAN    Standing Status:   Future    Standing Expiration Date:   06/04/2020  . EKG 12-Lead  . PCV ECHOCARDIOGRAM COMPLETE    Standing Status:   Future    Standing Expiration Date:   04/04/2021    Recommendations:   Darlene [name]  is a  87 y.o. Caucasian female with hypertension, hyperlipidemia, h/o sleep apnea not on CPAP, prior history of stroke in 2006 without residual defect, on Plavix chronically. She has had frequent palpitations, difficulty in controlling hypertension, extreme anxiety, multiple medication intolerances.  However with the present combination of medications, blood pressure has been well controlled and at last seen her in January  2022.  She walked into office stating "last night had a heart attack".  Chest pain symptoms very in the form of chest tightness with radiation to her jaw and to the right side of her neck.  Symptoms lasted for about 30 minutes to an hour, symptoms were waxing and waning.  Eventually subsided spontaneously.  This morning she had been doing well but had mild recurrence of jaw pain and thought that she probably should see [place] in the office.  EKG is essentially unremarkable from prior EKG.  No physical change in cardiac status, blood pressures well controlled, however am concerned about her  symptoms.  Symptoms may indicate either Prinzmetal's angina or new onset angina pectoris.  But she also has episodes of chest tightness from previous that are very atypical for angina.  I will start her on isosorbide mononitrate 30 mg daily, S/L NTG was prescribed and explained how to and when to use it and to notify us if there is change in frequency of use.   Schedule for a Exercise Nuclear stress test to evaluate for myocardial ischemia. Will schedule for an echocardiogram. Office visit following the work-up/investigations.   Patient instructed not to do heavy lifting, heavy exertional activity, swimming until evaluation is complete.  Patient instructed to call if symptoms worse or to go to the ED for further evaluation.  Check Troponin today.  Her lipids are not well controlled and I will address this on her next office visit.  She is presently on statin.  She has also been evaluated for cervical spine stenosis and evaluation does reveal spur and foraminal stenosis as well whether this is a radiation pain from cervical spine disease need to be evaluated as well.    Adrian Prows, MD, [facility] 04/04/2020, 3:16 PM Office: [phone]

## 2020-04-05 LAB — TROPONIN T: Troponin T (Highly Sensitive): <6 ng/L (ref 0–14)

## 2020-04-06 ENCOUNTER — Encounter: Payer: Self-pay | Admitting: Cardiology

## 2020-04-07 ENCOUNTER — Encounter (HOSPITAL_COMMUNITY): Payer: Self-pay

## 2020-04-07 ENCOUNTER — Telehealth: Payer: Self-pay | Admitting: Gastroenterology

## 2020-04-07 ENCOUNTER — Emergency Department (HOSPITAL_COMMUNITY)
Admission: EM | Admit: 2020-04-07 | Discharge: 2020-04-07 | Disposition: A | Discharge status: Home / Self Care | Payer: Medicare Other | Attending: Emergency Medicine | Admitting: Emergency Medicine

## 2020-04-07 DIAGNOSIS — R059 Cough, unspecified: Secondary | ICD-10-CM | POA: Diagnosis not present

## 2020-04-07 DIAGNOSIS — Z5321 Procedure and treatment not carried out due to patient leaving prior to being seen by health care provider: Secondary | ICD-10-CM | POA: Diagnosis not present

## 2020-04-07 NOTE — ED Triage Notes (Signed)
Pt presents via EMS from [facility] with c/o cough. Pt reports she has had a cough for approx one year but that is has been getting worse for the 2 days. Pt does have an ENT that she followed up with 3 weeks ago and they told her to use some home remedies. Pt reports the home remedies temporarily worked but the symptoms are now back.

## 2020-04-07 NOTE — ED Notes (Signed)
Called 3x for room placement. Eloped from waiting area.  

## 2020-04-07 NOTE — Telephone Encounter (Signed)
Pt states that she has been having a difficult time swallowing. She would like some advise as she does not know what to know.

## 2020-04-07 NOTE — Telephone Encounter (Signed)
I called the pt to discuss her swallowing issues and she tells me she called 911 and  is on the way to the ED for eval.  FYI Dr Rush Landmark

## 2020-04-08 NOTE — Progress Notes (Signed)
Patient aware.

## 2020-04-08 NOTE — Telephone Encounter (Signed)
Looks like the patient went to the ED and was complaining of cough symptoms.  Then left the waiting room. I guess we will see if she calls back. Thanks. GM

## 2020-04-08 NOTE — Progress Notes (Signed)
Let her know the trop was negative

## 2020-04-09 ENCOUNTER — Other Ambulatory Visit: Payer: Medicare Other

## 2020-04-11 ENCOUNTER — Non-Acute Institutional Stay: Payer: Medicare Other | Admitting: Internal Medicine

## 2020-04-11 ENCOUNTER — Encounter: Payer: Self-pay | Admitting: Internal Medicine

## 2020-04-11 ENCOUNTER — Other Ambulatory Visit: Payer: Self-pay

## 2020-04-11 VITALS — BP 128/80 | HR 91 | Temp 95.9°F | Ht 63.0 in | Wt 159.2 lb

## 2020-04-11 DIAGNOSIS — I10 Essential (primary) hypertension: Secondary | ICD-10-CM | POA: Diagnosis not present

## 2020-04-11 DIAGNOSIS — J310 Chronic rhinitis: Secondary | ICD-10-CM

## 2020-04-11 DIAGNOSIS — J209 Acute bronchitis, unspecified: Secondary | ICD-10-CM

## 2020-04-11 DIAGNOSIS — F5101 Primary insomnia: Secondary | ICD-10-CM

## 2020-04-11 DIAGNOSIS — E039 Hypothyroidism, unspecified: Secondary | ICD-10-CM

## 2020-04-11 DIAGNOSIS — Z8673 Personal history of transient ischemic attack (TIA), and cerebral infarction without residual deficits: Secondary | ICD-10-CM

## 2020-04-11 DIAGNOSIS — R32 Unspecified urinary incontinence: Secondary | ICD-10-CM

## 2020-04-11 DIAGNOSIS — K219 Gastro-esophageal reflux disease without esophagitis: Secondary | ICD-10-CM | POA: Diagnosis not present

## 2020-04-11 DIAGNOSIS — E785 Hyperlipidemia, unspecified: Secondary | ICD-10-CM

## 2020-04-11 DIAGNOSIS — F411 Generalized anxiety disorder: Secondary | ICD-10-CM

## 2020-04-12 NOTE — Progress Notes (Signed)
Location: [place] of Service:  Clinic (12)  Provider:   Code Status:  Goals of Care:  Advanced Directives 04/07/2020  Does Patient Have a Medical Advance Directive? No  Type of Advance Directive -  Does patient want to make changes to medical advance directive? -  Would patient like information on creating a medical advance directive? No - Patient declined     Chief Complaint  Patient presents with  . Medical Management of Chronic Issues    Patient returns to the office to discuss her balance issues, and ENT issues.    HPI: Patient is a 87 y.o. female seen today for an acute visit for Cough with Productive sputum  Patient has a history of chronic rhinitis sinusitis and dizziness.  She follows with Dr. Lucia Gaskins.  Is on Astelin  and Flonase.  Recently in the past few days she started having increased cough.  Went to ED but then walked out as the wait time was too long.  Saw someone in [place] and was prescribed amoxicillin for bronchitis. Patient states that she has been feeling little better since then.  Wants to know if there is anything else which can be done.  She is planning to visit her daughter Also has appointment with Dr. Lucia Gaskins in few weeks.  Her other issues GERD Repeat EGD was negative On Prilosec BID Depression Sees her psychotherapist from [place]. Does not want to try any antidepressant at this time.  Has failed sertraline in the past History of CVA with Visual Field deficit Continues on Plavix History of Atypical Chest pain Per Dr Einar Gip plan for stress test Also started on Imdur Insomnia Takes low-dose Valium or Ambien sometimes to help sleep and her anxiety Has history of sleep apnea but does not want to use CPAP at this time. Hypothyroidism Follows with Dr. Renne Crigler Low back pain and Neck Pain Follows with Dr Lorin Mercy MRI of Neck  some mild stenosis C4-5 through C6-7.  Some foraminal stenosis is present MRI of back done in 02/21  showed moderate right L5-S1 stenosis with L5 radiculopathy  Past Medical History:  Diagnosis Date  . Anxiety   . Asthma 04/06/2018  . Colon polyps   . Depression   . Hyperlipidemia   . Hypertension   . Hypothyroidism   . Skin cancer   . Sleep apnea   . Stroke [facility]) 2008    Past Surgical History:  Procedure Laterality Date  . ADENOIDECTOMY    . APPENDECTOMY  1960  . CATARACT EXTRACTION Left   . COLONOSCOPY    . GUM SURGERY  03/2019  . [place] SURGERY  2014  . MELANOMA EXCISION  2020  . MESH APPLIED TO LAP PORT    . TONSILLECTOMY  1940    Allergies  Allergen Reactions  . Norco [Hydrocodone-Acetaminophen] Nausea And Vomiting  . Dust Mite Extract   . Oxycontin [Oxycodone Hcl] Nausea And Vomiting  . Pollen Extract     Outpatient Encounter Medications as of 04/11/2020  Medication Sig  . acetaminophen (TYLENOL) 325 MG tablet Take 325-650 mg by mouth every 6 (six) hours as needed for moderate pain (for sinus headache).  Marland Kitchen azelastine (ASTELIN) 0.1 % nasal spray Place 2 sprays into both nostrils 2 (two) times daily.  . clopidogrel (PLAVIX) 75 MG tablet TAKE 1 TABLET BY MOUTH EVERY DAY  . diazepam (VALIUM) 5 MG tablet Take 0.5 tablets (2.5 mg total) by mouth as needed (anxiety).  . fluticasone (FLONASE) 50  MCG/ACT nasal spray SPRAY 2 SPRAYS IN EACH NOSTRIL AT BEDTIME  . ketoconazole (NIZORAL) 2 % shampoo Apply topically.  Marland Kitchen losartan (COZAAR) 50 MG tablet TAKE 1 TABLET BY MOUTH EVERY DAY  . nitroGLYCERIN (NITROSTAT) 0.4 MG SL tablet Place 1 tablet (0.4 mg total) under the tongue every 5 (five) minutes as needed for up to 25 days for chest pain.  Marland Kitchen omeprazole (PRILOSEC) 40 MG capsule Take 1 capsule (40 mg total) by mouth 2 (two) times daily before a meal.  . pravastatin (PRAVACHOL) 40 MG tablet Take 1 tablet (40 mg total) by mouth at bedtime.  . sodium chloride (OCEAN) 0.65 % SOLN nasal spray Place 1 spray into both nostrils as needed for congestion.  Marland Kitchen SYNTHROID 100 MCG tablet  TAKE 1 TABLET BY MOUTH DAILY BEFORE BREAKFAST.  Marland Kitchen Zolpidem Tartrate 3.5 MG SUBL Place 3.5 mg under the tongue at bedtime as needed.  . isosorbide mononitrate (IMDUR) 30 MG 24 hr tablet Take 1 tablet (30 mg total) by mouth daily.  Marland Kitchen moxifloxacin (VIGAMOX) 0.5 % ophthalmic solution Apply to eye. (Patient not taking: Reported on 04/11/2020)  . oxybutynin (DITROPAN-XL) 10 MG 24 hr tablet Take 10 mg by mouth daily.   No facility-administered encounter medications on file as of 04/11/2020.    Review of Systems:  Review of Systems  Health Maintenance  Topic Date Due  . DEXA SCAN  Never done  . TETANUS/TDAP  11/26/2018  . COVID-19 Vaccine (3 - Moderna risk 4-dose series) 03/19/2019  . INFLUENZA VACCINE  04/17/2020 (Originally 08/19/2019)  . COLONOSCOPY (Pts 45-3yrs Insurance coverage will need to be confirmed)  10/25/2021  . PNA vac Low Risk Adult  Completed  . HPV VACCINES  Aged Out    Physical Exam: Vitals:   04/11/20 0944  BP: 128/80  Pulse: 91  Temp: (!) 95.9 F (35.5 C)  SpO2: 96%  Weight: 159 lb 3.2 oz (72.2 kg)  Height: 5\' 3"  (1.6 m)   Body mass index is 28.2 kg/m. Physical Exam  Labs reviewed: Basic Metabolic Panel: Recent Labs    07/26/19 1041 08/24/19 1540 09/20/19 1944 09/20/19 1956 12/26/19 1053 02/07/20 1756  NA  --    < > 142 143 142 142  K  --    < > 3.7 4.3 4.3 3.8  CL  --    < > 106 108 105 109  CO2  --    < > 26  --  31 23  GLUCOSE  --    < > 130* 126* 114* 115*  BUN  --    < > 16 15 14 15   CREATININE  --    < > 0.71 0.70 0.80 0.78  CALCIUM  --    < > 9.4  --  10.0 9.5  TSH 0.74  --   --   --   --   --    < > = values in this interval not displayed.   Liver Function Tests: Recent Labs    08/24/19 1540 09/20/19 1944 12/26/19 1053  AST 16 19 16   ALT 16 21 13   ALKPHOS  --  80  --   BILITOT 0.4 0.4 0.5  PROT 6.8 7.3 7.0  ALBUMIN  --  4.4  --    No results for input(s): LIPASE, AMYLASE in the last 8760 hours. No results for input(s): AMMONIA  in the last 8760 hours. CBC: Recent Labs    08/24/19 1540 09/20/19 1944 09/20/19 1956 12/26/19 1053 02/07/20 1756  WBC 6.7 7.4  --  6.5 7.3  NEUTROABS 3,317 3.7  --  2,912  --   HGB 14.3 14.2 14.6 14.5 14.3  HCT 44.8 43.4 43.0 43.3 41.9  MCV 90.5 90.4  --  88.9 89.5  PLT 264 245  --  263 267   Lipid Panel: Recent Labs    12/26/19 1053  CHOL 180  HDL 51  LDLCALC 107*  TRIG 123  CHOLHDL 3.5   Lab Results  Component Value Date   HGBA1C 6.2 (H) 01/08/2020    Procedures since last visit: No results found.  Assessment/Plan 1. Chronic rhinitis On Amoxicillin Also on Flonase and Astelin Follow with Dr Lucia Gaskins  2. Acute bronchitis, unspecified organism I offered to start her on Steroid PO or Inhalers She is refusing right now She thinks travelling to [facility] will help her   3. Gastroesophageal reflux disease without esophagitis S/p EGD On Chronic Prilosec  4. Essential hypertension On Cozaar  5. Acquired hypothyroidism TSH normal in 7/21 Will Need repeat Labs  Follows with Dr Darla Lesches  6. History of CVA (cerebrovascular accident) On Plavix  7. GAD (generalized anxiety disorder) On Valium and Ambien   8. Primary insomnia On Ambien  9. Hyperlipidemia, unspecified hyperlipidemia type On Pravachol LDL still high Does not want to change anything Follows with Dr Gwendel Hanson  10. Urinary incontinence, unspecified type Controlled on Ditropan    Labs/tests ordered:  * No order type specified * Next appt:  Visit date not found

## 2020-04-14 ENCOUNTER — Other Ambulatory Visit: Payer: Self-pay

## 2020-04-14 ENCOUNTER — Ambulatory Visit: Payer: Medicare Other

## 2020-04-14 DIAGNOSIS — R0789 Other chest pain: Secondary | ICD-10-CM

## 2020-04-14 DIAGNOSIS — I201 Angina pectoris with documented spasm: Secondary | ICD-10-CM

## 2020-04-15 NOTE — Telephone Encounter (Signed)
Pt called stating that she is having serious difficulty swallowing and would like to be seen asap.

## 2020-04-15 NOTE — Telephone Encounter (Signed)
The pt called with complaints of dysphagia.  She has been scheduled to see Nicoletta Ba on 3/31 at 830 am.  She tells me she went to the ED a few weeks ago but left after 6 hours of waiting.  Her main complaint at that time was cough.  She tells me that she has trouble swallowing however, when she starts describing her symptoms it is more of a cough.

## 2020-04-16 ENCOUNTER — Other Ambulatory Visit: Payer: Self-pay

## 2020-04-16 ENCOUNTER — Ambulatory Visit: Payer: Medicare Other

## 2020-04-16 DIAGNOSIS — I201 Angina pectoris with documented spasm: Secondary | ICD-10-CM

## 2020-04-16 DIAGNOSIS — R0789 Other chest pain: Secondary | ICD-10-CM

## 2020-04-17 ENCOUNTER — Encounter: Payer: Self-pay | Admitting: Physician Assistant

## 2020-04-17 ENCOUNTER — Ambulatory Visit: Payer: Medicare Other | Admitting: Physician Assistant

## 2020-04-17 VITALS — BP 130/80 | HR 65 | Ht 63.0 in | Wt 159.0 lb

## 2020-04-17 DIAGNOSIS — K219 Gastro-esophageal reflux disease without esophagitis: Secondary | ICD-10-CM | POA: Diagnosis not present

## 2020-04-17 DIAGNOSIS — R1310 Dysphagia, unspecified: Secondary | ICD-10-CM

## 2020-04-17 DIAGNOSIS — T17310A Gastric contents in larynx causing asphyxiation, initial encounter: Secondary | ICD-10-CM

## 2020-04-17 NOTE — Patient Instructions (Signed)
If you are age 85 or older, your body mass index should be between 23-30. Your Body mass index is 28.17 kg/m. If this is out of the aforementioned range listed, please consider follow up with your Primary Care Provider.  If you are age 76 or younger, your body mass index should be between 19-25. Your Body mass index is 28.17 kg/m. If this is out of the aformentioned range listed, please consider follow up with your Primary Care Provider.   You have been scheduled for a Barium Esophogram at [facility] (1st floor of the hospital) on 04/21/2020 at 10:30 am. Please arrive 15 minutes prior to your appointment for registration. Make certain not to have anything to eat or drink 4 hours prior to your test. If you need to reschedule for any reason, please contact radiology at [phone] to do so. _____________________________________________________________ A barium swallow is an examination that concentrates on views of the esophagus. This tends to be a double contrast exam (barium and two liquids which, when combined, create a gas to distend the wall of the oesophagus) or single contrast (non-ionic iodine based). The study is usually tailored to your symptoms so a good history is essential. Attention is paid during the study to the form, structure and configuration of the esophagus, looking for functional disorders (such as aspiration, dysphagia, achalasia, motility and reflux) EXAMINATION You may be asked to change into a gown, depending on the type of swallow being performed. A radiologist and radiographer will perform the procedure. The radiologist will advise you of the type of contrast selected for your procedure and direct you during the exam. You will be asked to stand, sit or lie in several different positions and to hold a small amount of fluid in your mouth before being asked to swallow while the imaging is performed .In some instances you may be asked to swallow barium coated marshmallows  to assess the motility of a solid food bolus. The exam can be recorded as a digital or video fluoroscopy procedure. POST PROCEDURE It will take 1-2 days for the barium to pass through your system. To facilitate this, it is important, unless otherwise directed, to increase your fluids for the next 24-48hrs and to resume your normal diet.  This test typically takes about 30 minutes to perform. _____________________________________________________________  Continue your Omeprazole 40 mg twice daily.  Start Mucinex Twice daily as directed on the box. This is over the counter.  Keep your appointment with your ENT.  Follow up pending the results of Barium Swallow or as needed.  Thank you for entrusting me with your care and choosing [facility].  Amy Esterwood, PA-C

## 2020-04-17 NOTE — Progress Notes (Signed)
Subjective:    Patient ID: Darlene [name], female    DOB: [birth year], 87 y.o.   MRN: [id number]  HPI Darlene [name] is a pleasant 87 year old white female, established with Dr. Rush Landmark who comes in today with complaints of severe sinus drainage, some cough, and frequent choking episodes. Patient had undergone EGD in December 2021 for complaints of dysphagia.  She was not found to have any abnormality of the esophagus but empiric Savary dilation was done from 16 to 18 mm and she was noted to have a 5 cm hiatal hernia.  She also has prior history of grade C esophagitis on EGD in October 2020. Patient has hypertension, chronic GERD, prior history of CVA, generalized anxiety disorder, remote history of melanoma and history of colon polyps. Patient says that the esophageal dilation was helpful however now over the past couple of weeks she has had severe problems with constant severe sinus drainage.  She says she is bringing up white and clear material, no purulence.  She has not had any fever or chills.  She feels congested and says she can feel the mucus in her throat which she frequently has to spit up.  She has been having some tinnitus and occasional dizziness over the past couple of months. Currently she is having discomfort in her ear and significant congestion particularly of the right nostril. She had seen her PCP and is currently on a course of amoxicillin for possible bronchitis.  She tried to get in with her ear nose and throat physician Dr. Lucia Gaskins but it was unable to be seen for another few weeks.  She went to the ER on 04/11/2019 due to increased cough but did not stay due to long wait. She has now made herself an appointment with an ear nose and throat physician in [place] where she is from and has that appointment for next Friday. She says she is exhausted, cannot sleep due to all the congestion. In retrospect she has had some difficulty with swallowing over the past 3 years and is noted more  frequent choking with liquids, at present occasional dysphagia with solids.  She will sometimes have a choking type sensation at night.  Review of Systems Pertinent positive and negative review of systems were noted in the above HPI section.  All other review of systems was otherwise negative.  Outpatient Encounter Medications as of 04/17/2020  Medication Sig  . acetaminophen (TYLENOL) 325 MG tablet Take 325-650 mg by mouth every 6 (six) hours as needed for moderate pain (for sinus headache).  Marland Kitchen azelastine (ASTELIN) 0.1 % nasal spray Place 2 sprays into both nostrils 2 (two) times daily.  . clopidogrel (PLAVIX) 75 MG tablet TAKE 1 TABLET BY MOUTH EVERY DAY  . diazepam (VALIUM) 5 MG tablet Take 0.5 tablets (2.5 mg total) by mouth as needed (anxiety).  . fluticasone (FLONASE) 50 MCG/ACT nasal spray SPRAY 2 SPRAYS IN EACH NOSTRIL AT BEDTIME  . isosorbide mononitrate (IMDUR) 30 MG 24 hr tablet Take 1 tablet (30 mg total) by mouth daily.  Marland Kitchen ketoconazole (NIZORAL) 2 % shampoo Apply topically.  Marland Kitchen losartan (COZAAR) 50 MG tablet TAKE 1 TABLET BY MOUTH EVERY DAY  . nitroGLYCERIN (NITROSTAT) 0.4 MG SL tablet Place 1 tablet (0.4 mg total) under the tongue every 5 (five) minutes as needed for up to 25 days for chest pain.  Marland Kitchen omeprazole (PRILOSEC) 40 MG capsule Take 1 capsule (40 mg total) by mouth 2 (two) times daily before a meal.  . oxybutynin (DITROPAN-XL) 10  MG 24 hr tablet Take 10 mg by mouth daily.  . pravastatin (PRAVACHOL) 40 MG tablet Take 1 tablet (40 mg total) by mouth at bedtime.  . sodium chloride (OCEAN) 0.65 % SOLN nasal spray Place 1 spray into both nostrils as needed for congestion.  Marland Kitchen SYNTHROID 100 MCG tablet TAKE 1 TABLET BY MOUTH DAILY BEFORE BREAKFAST.  Marland Kitchen Zolpidem Tartrate 3.5 MG SUBL Place 3.5 mg under the tongue at bedtime as needed.  . [DISCONTINUED] moxifloxacin (VIGAMOX) 0.5 % ophthalmic solution Apply to eye. (Patient not taking: Reported on 04/11/2020)   No facility-administered  encounter medications on file as of 04/17/2020.   Allergies  Allergen Reactions  . Norco [Hydrocodone-Acetaminophen] Nausea And Vomiting  . Dust Mite Extract   . Oxycontin [Oxycodone Hcl] Nausea And Vomiting  . Pollen Extract    Patient Active Problem List   Diagnosis Date Noted  . Other spondylosis with radiculopathy, cervical region 02/19/2020  . Prediabetes 01/09/2020  . Adverse reaction to COVID-19 vaccine 11/29/2019  . Chronic cough 11/24/2019  . Melanoma of skin ([place]) 08/24/2019  . Palpitations 06/07/2019  . Schatzki's ring 05/12/2019  . Hiatal hernia 05/12/2019  . Insomnia 03/29/2019  . Back pain with left-sided sciatica 03/01/2019  . Daytime sleepiness 11/30/2018  . History of colonic polyps 09/03/2018  . Dysphagia 09/03/2018  . Pyrosis 09/03/2018  . Chronic sore throat 09/03/2018  . Actinic keratoses 02/23/2018  . Seasonal and perennial allergic rhinitis 01/31/2018  . Incontinent of urine 12/01/2017  . Essential hypertension 09/06/2017  . History of CVA (cerebrovascular accident) 09/06/2017  . Hyperlipidemia 09/06/2017  . GAD (generalized anxiety disorder) 09/06/2017  . Acquired hypothyroidism 09/06/2017  . Chronic sinusitis 09/06/2017  . Gastroesophageal reflux disease 09/06/2017   Social History   Socioeconomic History  . Marital status: Widowed    Spouse name: Not on file  . Number of children: 3  . Years of education: Not on file  . Highest education level: Not on file  Occupational History  . Occupation: retired Radiographer, therapeutic  Tobacco Use  . Smoking status: Never Smoker  . Smokeless tobacco: Never Used  Vaping Use  . Vaping Use: Never used  Substance and Sexual Activity  . Alcohol use: Yes    Comment: hardly ever  . Drug use: Never  . Sexual activity: Not on file  Other Topics Concern  . Not on file  Social History Narrative   Social History      Diet? ok      Do you drink/eat things with caffeine? Weak coffee, (much milk)       Marital status?          divorced                          What year were you married? 1961      Do you live in a house, apartment, assisted living, condo, trailer, etc.? apartment      Is it one or more stories? 4      How many persons live in your home? Only me      Do you have any pets in your home? (please list) no      Highest level of education completed? 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             yes  Type & how often? Walk- would love to return to water exercise      Advanced Directives      Do you have a living will? yes      Do you have a DNR form?           no                       If not, do you want to discuss one? no      Do you have signed POA/HPOA for forms? no      Functional Status      Do you have difficulty bathing or dressing yourself?  no      Do you have difficulty preparing food or eating? no      Do you have difficulty managing your medications? no      Do you have difficulty managing your finances?  no      Do you have difficulty affording your medications? no      Social Determinants of Health   Financial Resource Strain: Not on file  Food Insecurity: Not on file  Transportation Needs: Not on file  Physical Activity: Not on file  Stress: Not on file  Social Connections: Not on file  Intimate Partner Violence: Not on file    Darlene [name] family history includes Allergic rhinitis in her sister; Bipolar disorder in her daughter; CVA in her brother; Heart attack in her father and paternal grandfather; Heart disease in her mother; Hypertension in her sister; Lung cancer in her maternal grandfather; Lung cancer (age of onset: 10) in her mother; Psoriasis in her daughter; Rheum arthritis in her daughter; Stroke (age of onset: 77) in her father.      Objective:    Vitals:   04/17/20 0828  BP: 130/80  Pulse: 65    Physical Exam  Well-developed well-nourished elderly white female in no acute distress.  Height, Weight, BMI 28.1  HEENT; nontraumatic normocephalic, EOMI, PE R LA, sclera anicteric.,  Congested Oropharynx; not examined today Neck; supple, no JVD Cardiovascular; regular rate and rhythm with S1-S2, no murmur rub or gallop Pulmonary; Clear bilaterally Abdomen; soft, nontender, nondistended, no palpable mass or hepatosplenomegaly, bowel sounds are active Rectal; not done today Skin; benign exam, no jaundice rash or appreciable lesions Extremities; no clubbing cyanosis or edema skin warm and dry Neuro/Psych; alert and oriented x4, grossly nonfocal mood and affect appropriate       Assessment & Plan:   #46 87 year old white female with chronic sinus issues with exacerbation over the past couple of weeks with severe congestion, constant sinus drainage which precipitates cough.  Along with this she has noticed some increased choking episodes with liquids.  She is not having any significant solid food dysphagia currently.  I suspect the majority of her symptoms currently are secondary to sinus drainage with excessive mucus in pharynx and larynx exacerbating cough and sensation of choking  #2 history of dysphagia status post empiric Savary dilation December 2021, mild improvement in symptoms Rule out underlying dysmotility, presbyesophagus.  Rule out component of neurogenic dysphagia in patient with prior CVA  #3 history of prior CVA-on Plavix next number  #4 chronic anxiety #5.  Hypertension #6.  Prior history of colon polyps-no further colonoscopies due to advanced age  Plan; Patient will be scheduled for a barium swallow with tablet next week. Continue omeprazole 40 mg p.o. twice daily Add trial of OTC Mucinex twice daily to thin secretions Continue  regular use of Netty  Pot Elevate head of bed 45 degrees for sleep I think will be most helpful for her to get in with ENT for further evaluation, as above  this appointment is scheduled for next week with a physician in [place].     Corney Knighton S Alexzandra Bilton PA-C 04/17/2020   Cc: Orma Flaming, MD

## 2020-04-17 NOTE — Progress Notes (Signed)
Attending Physician's Attestation   I have reviewed the chart.   I agree with the Advanced Practitioner's note, impression, and recommendations with any updates as below.    Gordy Goar Mansouraty, MD [facility] Office # [phone]  

## 2020-04-21 ENCOUNTER — Other Ambulatory Visit: Payer: Self-pay

## 2020-04-21 ENCOUNTER — Other Ambulatory Visit: Payer: Self-pay | Admitting: Internal Medicine

## 2020-04-21 ENCOUNTER — Ambulatory Visit (HOSPITAL_COMMUNITY)
Admission: RE | Admit: 2020-04-21 | Discharge: 2020-04-21 | Disposition: A | Discharge status: Home / Self Care | Payer: Medicare Other | Source: Ambulatory Visit | Attending: Physician Assistant | Admitting: Physician Assistant

## 2020-04-21 DIAGNOSIS — K219 Gastro-esophageal reflux disease without esophagitis: Secondary | ICD-10-CM | POA: Insufficient documentation

## 2020-04-21 DIAGNOSIS — T17310A Gastric contents in larynx causing asphyxiation, initial encounter: Secondary | ICD-10-CM | POA: Diagnosis present

## 2020-04-21 DIAGNOSIS — R1310 Dysphagia, unspecified: Secondary | ICD-10-CM | POA: Diagnosis present

## 2020-04-21 IMAGING — RF DG ESOPHAGUS
14 of 17 series · 16 of 24 positions shown · non-contrast
Comparison: None

CLINICAL DATA: Choking, post esophageal dilation in [REDACTED].

EXAM:
ESOPHOGRAM/BARIUM SWALLOW
TECHNIQUE: Single contrast examination was performed using  thin barium.
FLUOROSCOPY TIME:  Fluoroscopy Time:  3 minutes 18 seconds
Radiation Exposure Index (if provided by the fluoroscopic device):
15.4
Number of Acquired Spot Images: 0

[Series 1: cp_standard · 0.35mm/px · 1 of 144 frames shown (1 of 14)]
[frame 9/144]
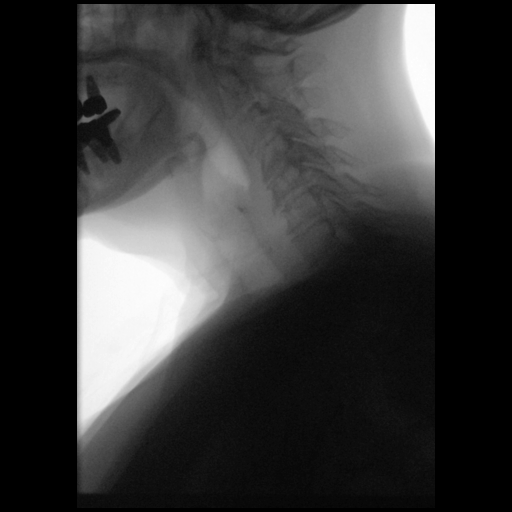

[Series 2: cp_standard · 0.35mm/px · 1 of 36 frames shown (2 of 14)]
[frame 19/36]
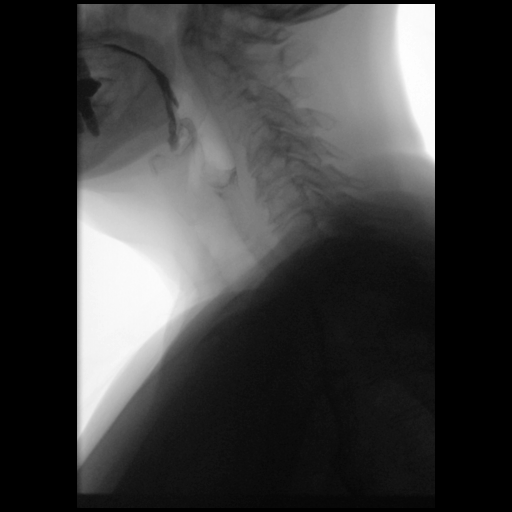

[Series 3: cp_standard · 0.35mm/px · 1 of 4 frames shown (3 of 14)]
[frame 3/4]
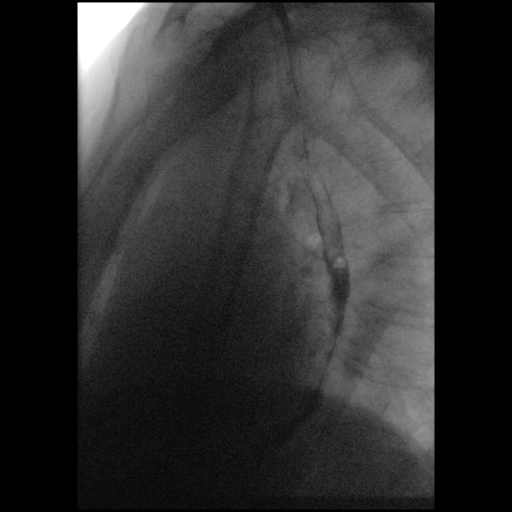

[Series 4: cp_standard · 0.35mm/px · 1 of 51 frames shown (4 of 14)]
[frame 44/51]
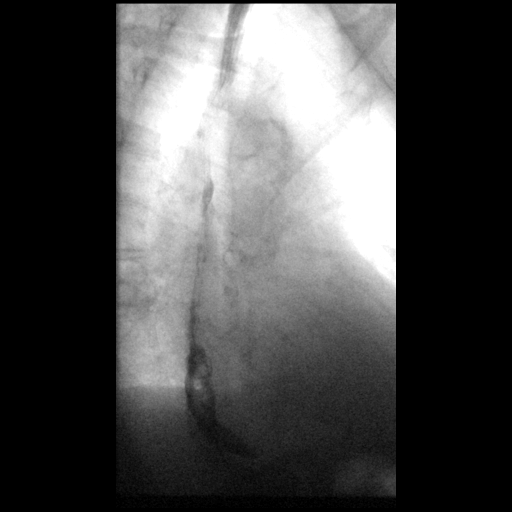

[Series 5: cp_standard · 0.35mm/px · 1 of 71 frames shown (5 of 14)]
[frame 36/71]
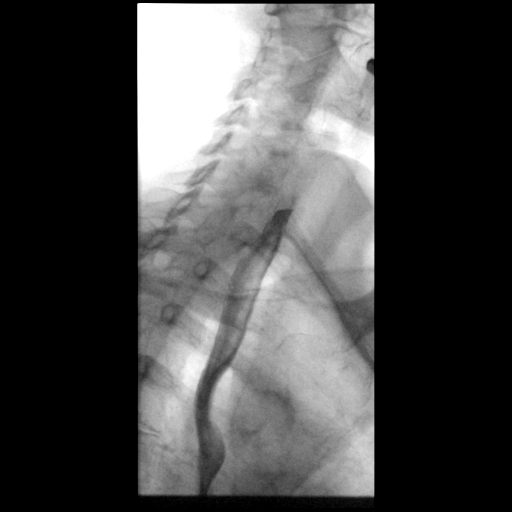

[Series 6: cp_standard · 0.35mm/px · 1 of 9 frames shown (6 of 14)]
[frame 8/9]
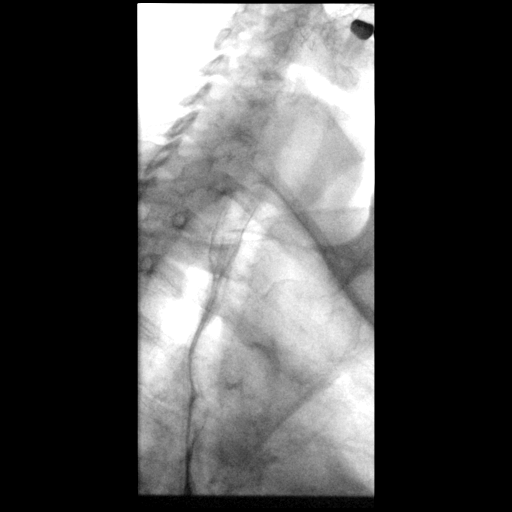

[Series 7: cp_standard · 0.36mm/px · 1 of 53 frames shown (7 of 14)]
[frame 27/53]
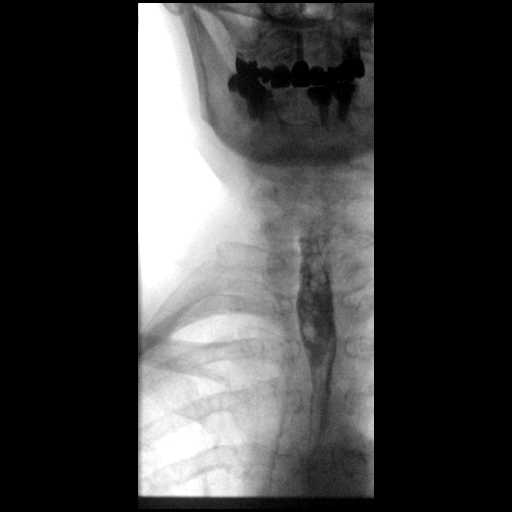

[Series 8: cp_standard · 0.36mm/px · 1 of 5 frames shown (8 of 14)]
[frame 5/5]
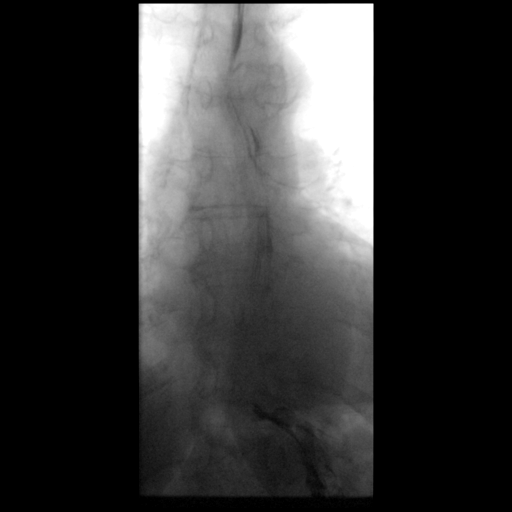

[Series 9: cp_standard · 0.36mm/px · 1 of 39 frames shown (9 of 14)]
[frame 20/39]
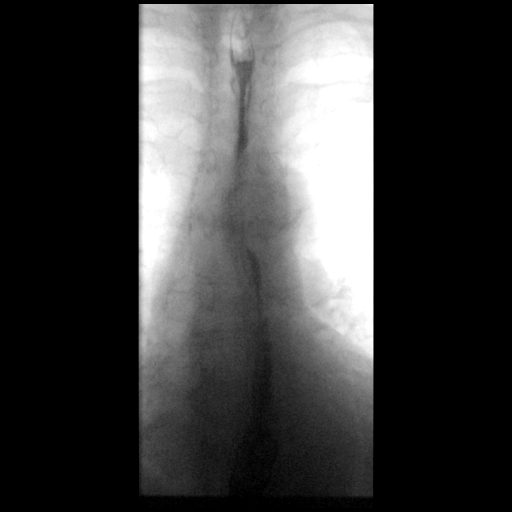

[Series 10: cp_standard · 0.36mm/px · 1 of 52 frames shown (10 of 14)]
[frame 45/52]
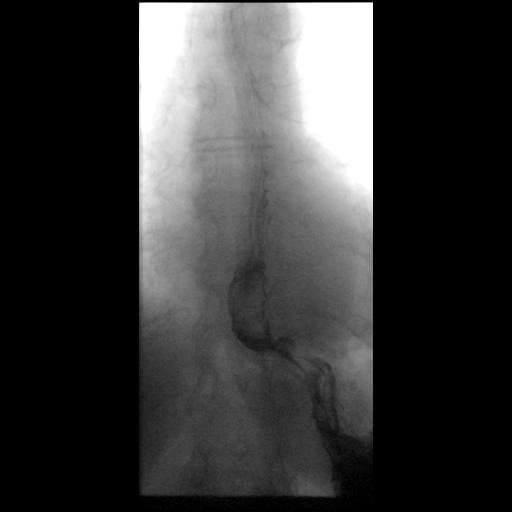

[Series 11: cp_standard · 0.36mm/px · 1 of 8 frames shown (11 of 14)]
[frame 5/8]
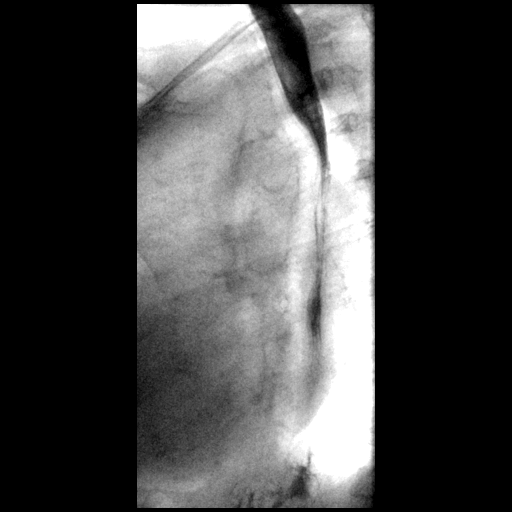

[Series 13: cp_standard · 0.54mm/px · 2 of 7 frames shown (12 of 14)]
[frame 2/7]
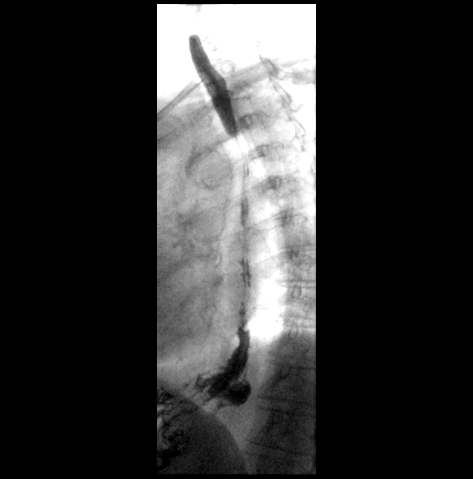
[frame 6/7]
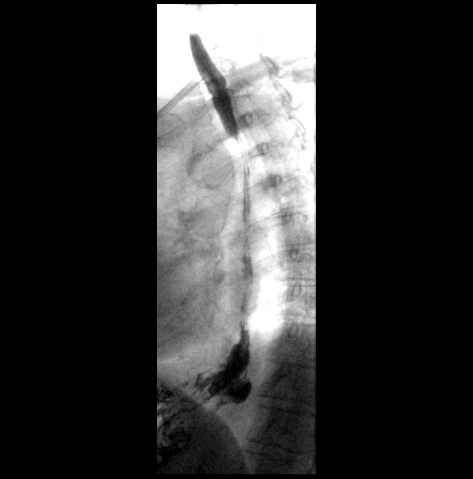

[Series 15: cp_standard · 0.36mm/px · 2 of 20 frames shown (13 of 14)]
[frame 4/20]
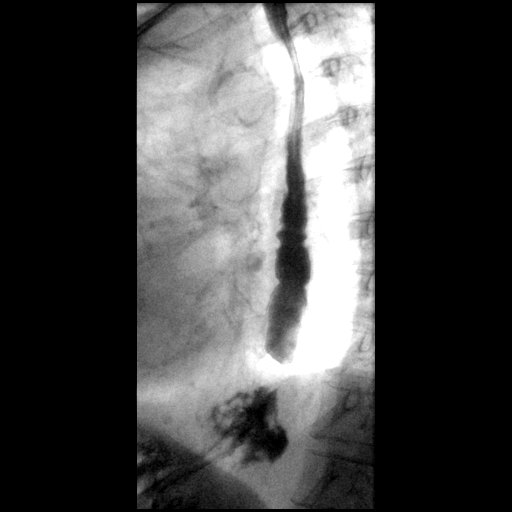
[frame 19/20]
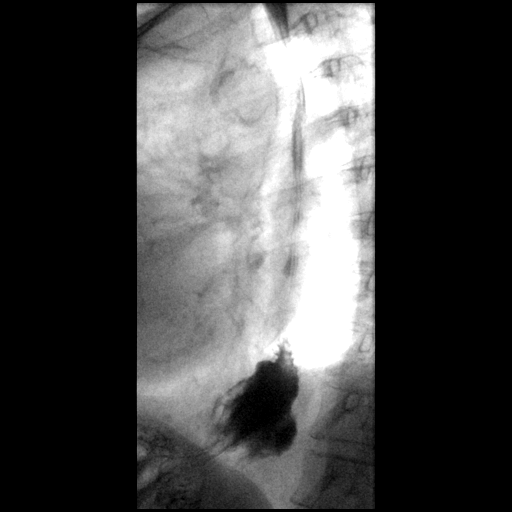

[Series 17: cp_standard · 0.18mm/px · 1 of 1 slices shown (14 of 14)]
[im 1/1]
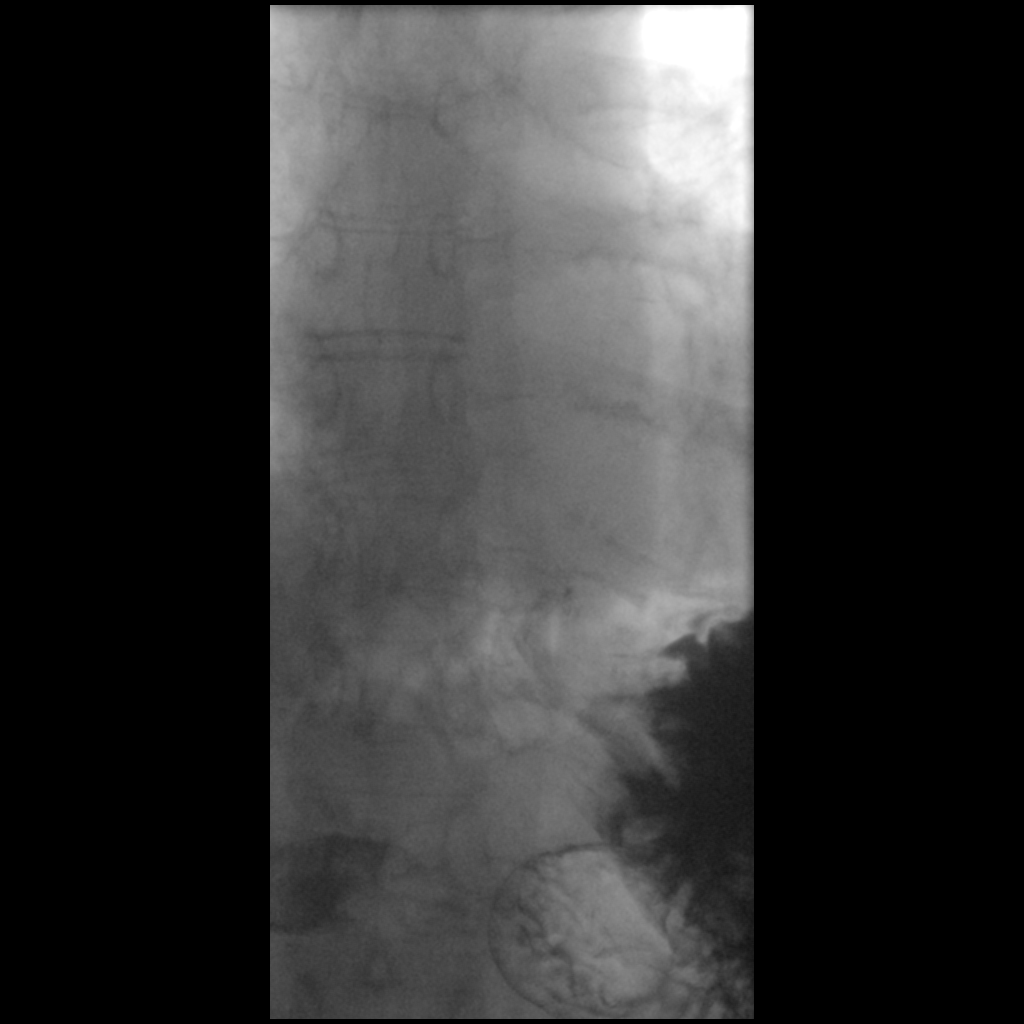

[16 of 24 positions shown; findings below may reference images not displayed]

FINDINGS: Limited swallow assessment was performed in AP and lateral
projection. This showed piecemeal handling of the bolus in the oral
phase with spilling to the vallecular. The patient was unable to
swallow larger amounts of thin barium contrast. The esophagus showed
relatively normal distensibility though the assessment was limited
by the amount contrasted could be swallowed by the patient at any
given time. Potential mild narrowing distally at the level of the EG
junction. There is abundant tertiary peristaltic activity. Hiatal
hernia was noted distally, moderate size.

Single swallow assessment showed a primary wave with interrupted
tertiary peristalsis and proximal escape of the bolus to the
proximal portion of the esophagus. This contrast passed proximally
to near the upper esophageal sphincter with limited clearing on
subsequent swallows. Full column assessment with limited distension
with question of mildly limited distension at the EG junction,
potentially the site of prior dilation.
IMPRESSION: Limited esophagram due to the small amount of material that could be
swallowed any given time by the patient. Even slightly larger
swallows were followed by some coughing.

Signs of marked esophageal dysmotility with stasis in the esophagus
and potential distal esophageal narrowing the setting of hiatal
hernia.

No signs of gross aspiration or even penetration on limited swallow
assessment. However, given the history of CVA and difficulties in
the oral phase dedicated swallow assessment may be helpful.

## 2020-04-22 NOTE — Progress Notes (Signed)
Normal echocardiogram

## 2020-04-25 ENCOUNTER — Ambulatory Visit: Payer: Medicare Other | Admitting: Student

## 2020-04-25 ENCOUNTER — Telehealth: Payer: Self-pay | Admitting: Physician Assistant

## 2020-04-25 DIAGNOSIS — T17310A Gastric contents in larynx causing asphyxiation, initial encounter: Secondary | ICD-10-CM

## 2020-04-25 DIAGNOSIS — K219 Gastro-esophageal reflux disease without esophagitis: Secondary | ICD-10-CM

## 2020-04-25 DIAGNOSIS — R1310 Dysphagia, unspecified: Secondary | ICD-10-CM

## 2020-04-25 NOTE — Telephone Encounter (Signed)
Spoke with patient, discussed her barium swallow study results and recommendations. She is aware that modified barium swallow study will be the next step for our office. Patient states that she saw ENT in [place] and he recommended a procedure that would help with the sinus drainage but would not help with swallowing, she discussed with primary ENT (Dr. Margarita Rana) he did not know what the procedure was. She states that if she does proceed with the ENT procedure she would have to be under his care for an additional 10 days. Patient would like to see when we can get her scheduled for the modified barium swallow study and will make her decision based on that. She states that she will come back to [facility] first if she needs to. She is aware that we will have to contact speech pathology on Monday to schedule and will let her know. Patient verbalized understanding and had no concerns at the end of the call.

## 2020-04-25 NOTE — Telephone Encounter (Signed)
Patient called and would like to discuss possibly having a procedure for her sinus issues with a nurse or Amy Esterwood to see if it would help with trouble swallowing.

## 2020-04-28 ENCOUNTER — Other Ambulatory Visit (HOSPITAL_COMMUNITY): Payer: Self-pay | Admitting: *Deleted

## 2020-04-28 DIAGNOSIS — R1310 Dysphagia, unspecified: Secondary | ICD-10-CM

## 2020-04-28 NOTE — Telephone Encounter (Signed)
Spoke with Darlene [name] in speech pathology. Patient has been scheudled for a modified barium swallow study at [facility] on Tuesday, 05/12/20 at 11:30 AM. No restrictions.  Patient will enter through main entrance for pre-screening COVID questions and proceed to radiology. Patient is aware of appt, patient requested that this information be sent to her My Chart. Patient verbalized understanding and had no concerns at the end of the call.

## 2020-04-28 NOTE — Telephone Encounter (Signed)
Pt called stating that last time that she spoke with Amy's nurse she was given other options for tests that she could have done. Pt would like to schedule test after April 19 since she is in [place] at the moment. She is requesting a call back before 1:00pm at all possible.

## 2020-05-06 ENCOUNTER — Other Ambulatory Visit: Payer: Self-pay | Admitting: Nurse Practitioner

## 2020-05-06 DIAGNOSIS — K219 Gastro-esophageal reflux disease without esophagitis: Secondary | ICD-10-CM

## 2020-05-07 NOTE — Telephone Encounter (Signed)
RX last filled on 12/31/2019, no treatment agreement on file and no pending appointment

## 2020-05-07 NOTE — Telephone Encounter (Signed)
Virgie Dad, MD  You 3 hours ago (10:36 AM)     Can we send this to [facility] as she would be seeing her as PCP now or Dr Sabra Heck ?   Message text

## 2020-05-12 ENCOUNTER — Ambulatory Visit (INDEPENDENT_AMBULATORY_CARE_PROVIDER_SITE_OTHER): Payer: Medicare Other | Admitting: Otolaryngology

## 2020-05-12 ENCOUNTER — Other Ambulatory Visit: Payer: Self-pay

## 2020-05-12 ENCOUNTER — Ambulatory Visit (HOSPITAL_COMMUNITY)
Admission: RE | Admit: 2020-05-12 | Discharge: 2020-05-12 | Disposition: A | Discharge status: Home / Self Care | Payer: Medicare Other | Source: Ambulatory Visit | Attending: Physician Assistant | Admitting: Physician Assistant

## 2020-05-12 VITALS — Temp 97.2°F

## 2020-05-12 DIAGNOSIS — R1310 Dysphagia, unspecified: Secondary | ICD-10-CM | POA: Insufficient documentation

## 2020-05-12 DIAGNOSIS — T17310A Gastric contents in larynx causing asphyxiation, initial encounter: Secondary | ICD-10-CM | POA: Insufficient documentation

## 2020-05-12 DIAGNOSIS — K219 Gastro-esophageal reflux disease without esophagitis: Secondary | ICD-10-CM | POA: Insufficient documentation

## 2020-05-12 DIAGNOSIS — J310 Chronic rhinitis: Secondary | ICD-10-CM

## 2020-05-12 IMAGING — RF DG SWALLOWING FUNCTION
12 of 24 series · 12 of 24 positions shown · non-contrast
Comparison: Esophagram acquired on [DATE].

CLINICAL DATA: Dysphagia, coughing while swallowing.

EXAM:
MODIFIED BARIUM SWALLOW
TECHNIQUE: Different consistencies of barium were administered orally to the
patient by the Speech Pathologist. Imaging of the pharynx was
performed in the lateral projection. The radiologist was present in
the fluoroscopy room for this study, providing personal supervision.
FLUOROSCOPY TIME:  Fluoroscopy Time:  2 minutes 45 seconds
Radiation Exposure Index (if provided by the fluoroscopic device):
13.6 mGy
Number of Acquired Spot Images: 0

[Series 2: run · 1 of 36 frames shown (1 of 12)]
[frame 6/36]
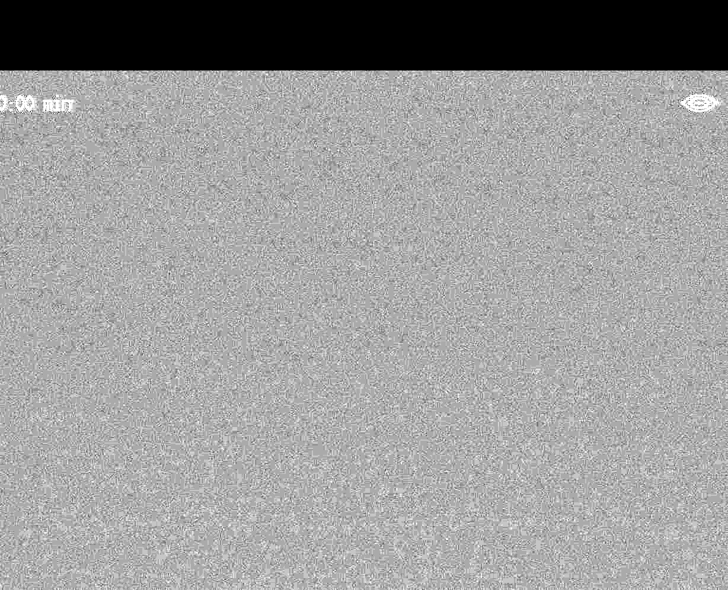

[Series 4: run · 1 of 92 frames shown (2 of 12)]
[frame 47/92]
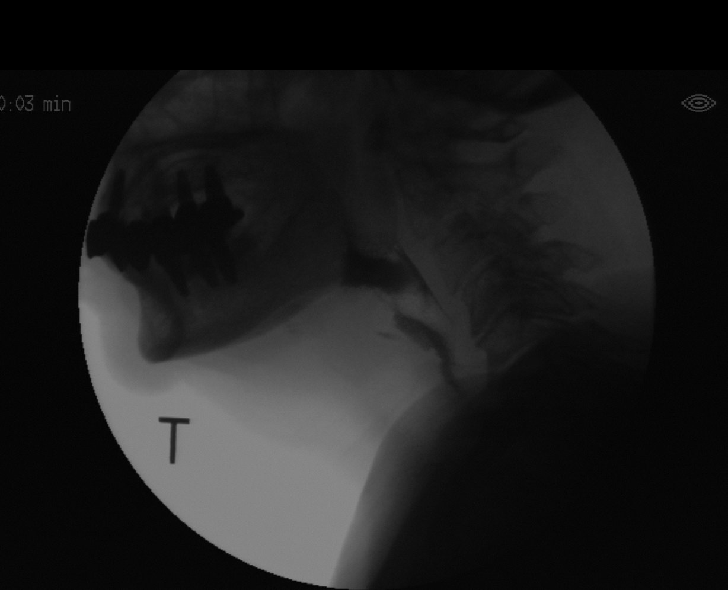

[Series 6: run · 1 of 167 frames shown (3 of 12)]
[frame 84/167]
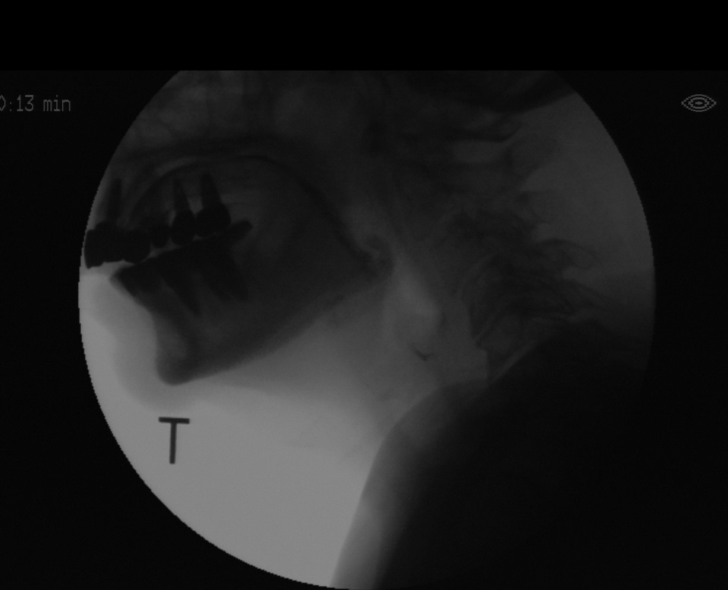

[Series 8: run · 1 of 195 frames shown (4 of 12)]
[frame 138/195]
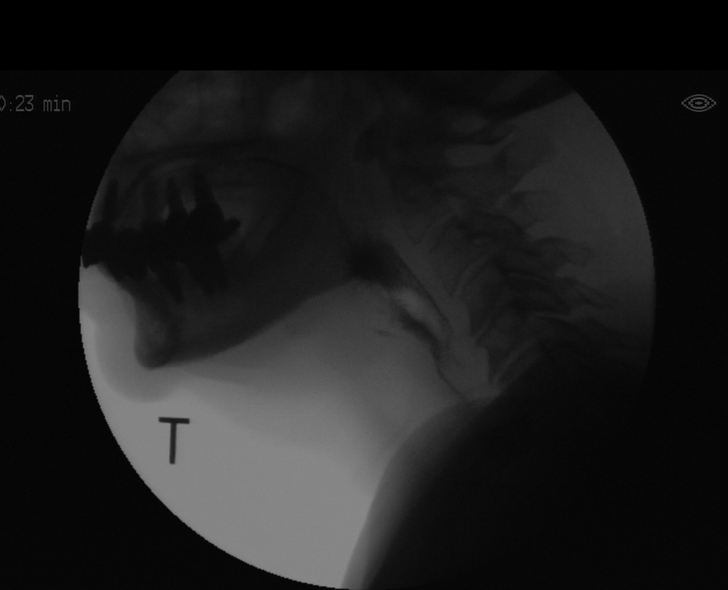

[Series 10: run · 1 of 330 frames shown (5 of 12)]
[frame 191/330]
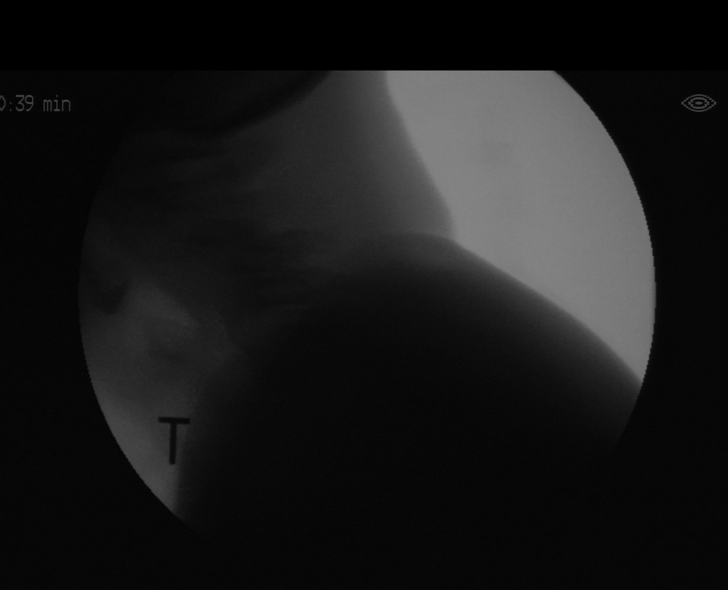

[Series 12: run · 1 of 9 frames shown (6 of 12)]
[frame 5/9]
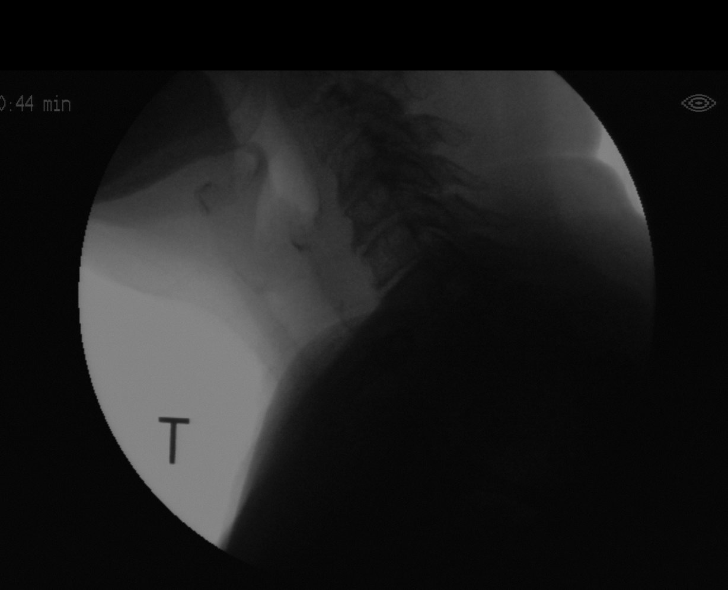

[Series 14: run · 1 of 375 frames shown (7 of 12)]
[frame 319/375]
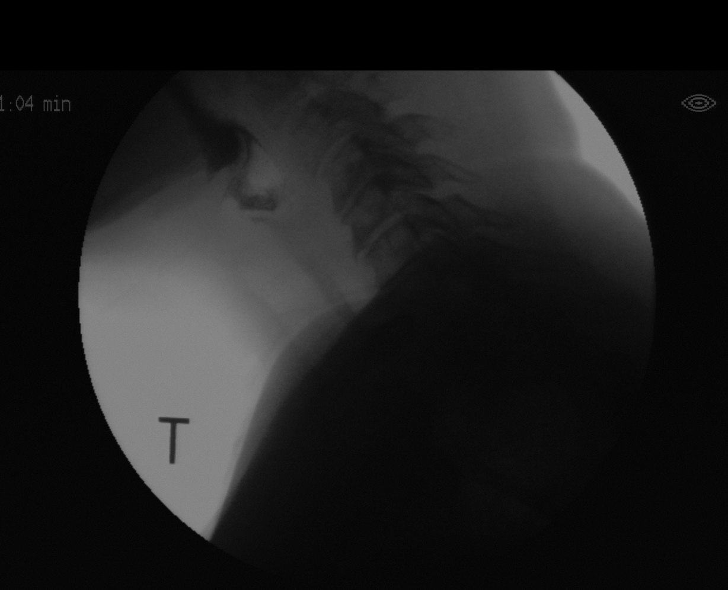

[Series 16: run · 1 of 353 frames shown (8 of 12)]
[frame 177/353]
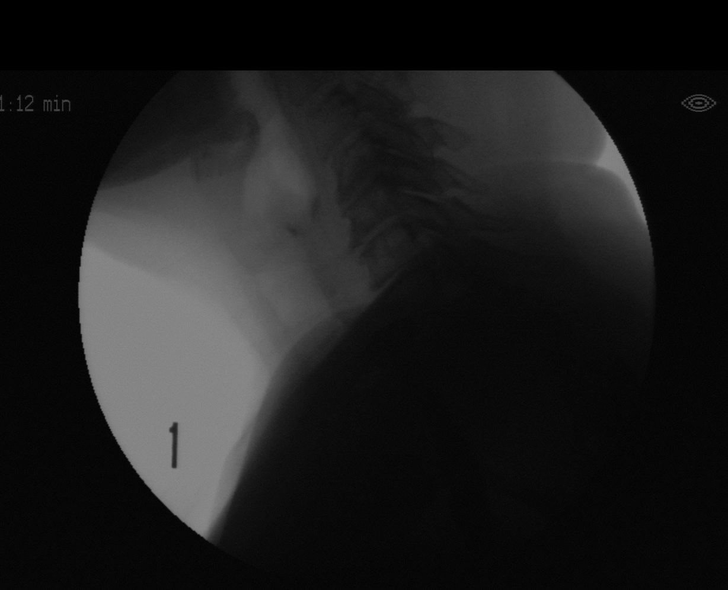

[Series 18: run · 1 of 158 frames shown (9 of 12)]
[frame 84/158]
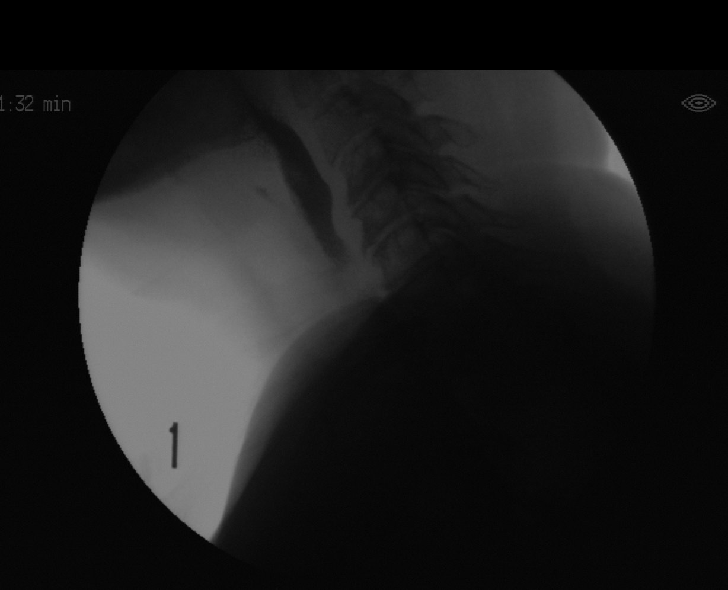

[Series 20: run · 1 of 447 frames shown (10 of 12)]
[frame 380/447]
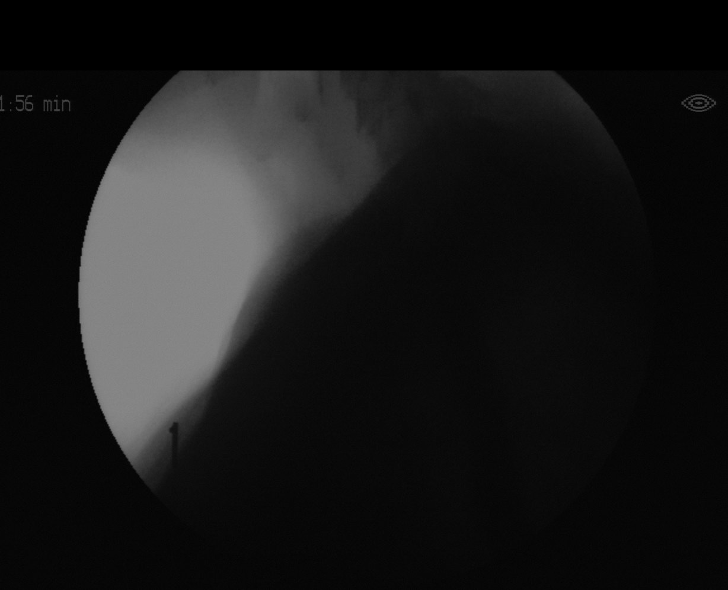

[Series 22: run · 1 of 333 frames shown (11 of 12)]
[frame 284/333]
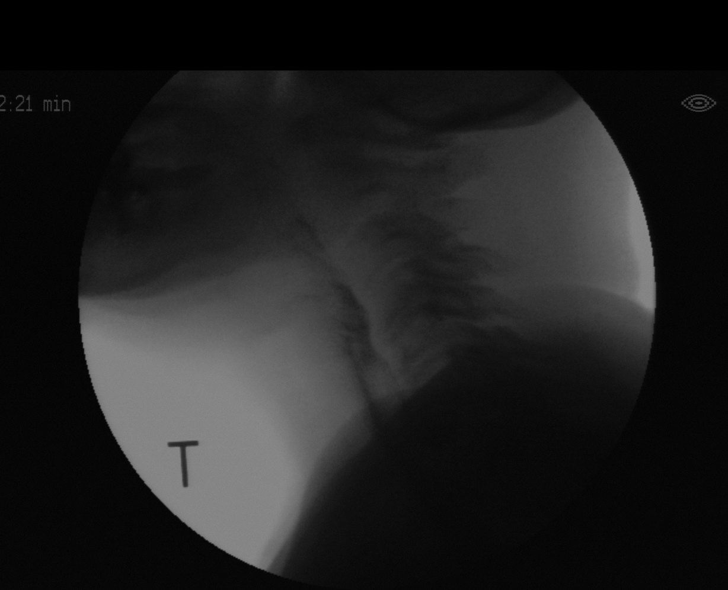

[Series 24: run · 1 of 280 frames shown (12 of 12)]
[frame 239/280]
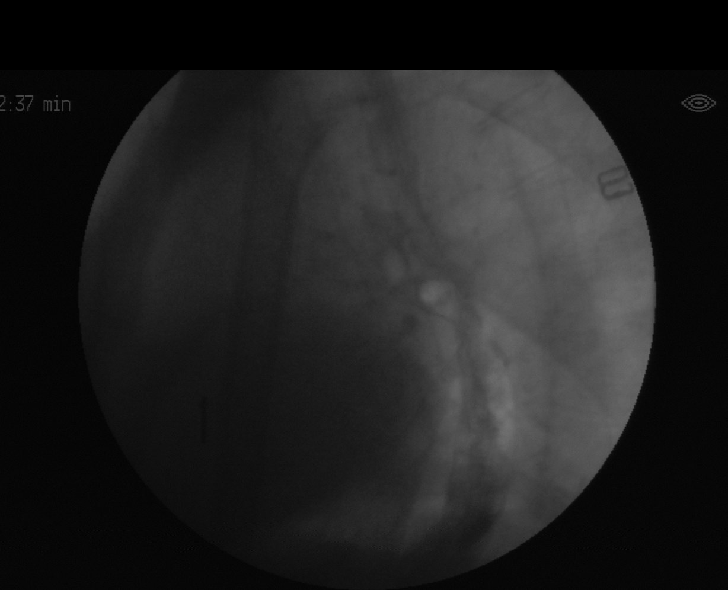

[12 of 24 positions shown; findings below may reference images not displayed]

FINDINGS: Swallowing was performed across multiple consistencies. No signs of
penetration or aspiration. Stasis of material noted in the esophagus
during the examination in this patient with reported dysmotility.
Pill swallowed passing into the stomach.
IMPRESSION: Signs of dysmotility as before. Stasis in the esophagus and some
distal narrowing not as well assessed as on the previous study.

No signs of penetration or aspiration. The patient did cough during
swallowing.

Please refer to the Speech Pathologists report for complete details
and recommendations.

## 2020-05-12 NOTE — Progress Notes (Signed)
HPI: Darlene [name] is a 87 y.o. female who returns today for evaluation of chronic trouble breathing through her nose which is worse at night.  She also feels like her ears are plugged up.  She uses a Nettie pot at night in order to breathe.  She feels like she is obstructed presently.  Patient had a barium swallow performed earlier today because of swallowing and coughing.  She is presently using nasal steroid spray at night and saline rinses as well as nasal gel during the day.  Even when she uses a nasal gel she feels like her nose is dry an hour later.  She complains of chronic postnasal drainage..  Past Medical History:  Diagnosis Date  . Anxiety   . Asthma 04/06/2018  . Colon polyps   . Depression   . Hyperlipidemia   . Hypertension   . Hypothyroidism   . Skin cancer   . Sleep apnea   . Stroke [facility]) 2008   Past Surgical History:  Procedure Laterality Date  . ADENOIDECTOMY    . APPENDECTOMY  1960  . CATARACT EXTRACTION Left   . COLONOSCOPY    . GUM SURGERY  03/2019  . [place] SURGERY  2014  . MELANOMA EXCISION  2020  . MESH APPLIED TO LAP PORT    . TONSILLECTOMY  1940   Social History   Socioeconomic History  . Marital status: Widowed    Spouse name: Not on file  . Number of children: 3  . Years of education: Not on file  . Highest education level: Not on file  Occupational History  . Occupation: retired Radiographer, therapeutic  Tobacco Use  . Smoking status: Never Smoker  . Smokeless tobacco: Never Used  Vaping Use  . Vaping Use: Never used  Substance and Sexual Activity  . Alcohol use: Yes    Comment: hardly ever  . Drug use: Never  . Sexual activity: Not on file  Other Topics Concern  . Not on file  Social History Narrative   Social History      Diet? ok      Do you drink/eat things with caffeine? Weak coffee, (much milk)      Marital status?          divorced                          What year were you married? 1961      Do you live in a  house, apartment, assisted living, condo, trailer, etc.? apartment      Is it one or more stories? 4      How many persons live in your home? Only me      Do you have any pets in your home? (please list) no      Highest level of education completed? 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             yes                         Type & how often? Walk- would love to return to water exercise      Advanced Directives      Do you have a living will? yes      Do you have a DNR form?  no                       If not, do you want to discuss one? no      Do you have signed POA/HPOA for forms? no      Functional Status      Do you have difficulty bathing or dressing yourself?  no      Do you have difficulty preparing food or eating? no      Do you have difficulty managing your medications? no      Do you have difficulty managing your finances?  no      Do you have difficulty affording your medications? no      Social Determinants of Health   Financial Resource Strain: Not on file  Food Insecurity: Not on file  Transportation Needs: Not on file  Physical Activity: Not on file  Stress: Not on file  Social Connections: Not on file   Family History  Problem Relation Age of Onset  . Lung cancer Mother 11       smoker  . Heart disease Mother   . Stroke Father 49  . Heart attack Father   . Allergic rhinitis Sister   . Hypertension Sister   . CVA Brother   . Lung cancer Maternal Grandfather   . Heart attack Paternal Grandfather   . Psoriasis Daughter   . Rheum arthritis Daughter   . Bipolar disorder Daughter   . Colon cancer Neg Hx   . Esophageal cancer Neg Hx   . Inflammatory bowel disease Neg Hx   . Liver disease Neg Hx   . Pancreatic cancer Neg Hx   . Stomach cancer Neg Hx   . Rectal cancer Neg Hx    Allergies  Allergen Reactions  . Norco [Hydrocodone-Acetaminophen] Nausea And  Vomiting  . Dust Mite Extract   . Oxycontin [Oxycodone Hcl] Nausea And Vomiting  . Pollen Extract    Prior to Admission medications   Medication Sig Start Date End Date Taking? Authorizing Provider  acetaminophen (TYLENOL) 325 MG tablet Take 325-650 mg by mouth every 6 (six) hours as needed for moderate pain (for sinus headache).    [provider]  azelastine (ASTELIN) 0.1 % nasal spray Place 2 sprays into both nostrils 2 (two) times daily. 01/31/18   Valentina Shaggy, MD  clopidogrel (PLAVIX) 75 MG tablet TAKE 1 TABLET BY MOUTH EVERY DAY 11/20/19   Virgie Dad, MD  diazepam (VALIUM) 5 MG tablet Take 0.5 tablets (2.5 mg total) by mouth as needed (anxiety). 11/26/19   Orma Flaming, MD  fluticasone [facility]) 50 MCG/ACT nasal spray SPRAY 2 SPRAYS IN EACH NOSTRIL AT BEDTIME 12/25/19   Rozetta Nunnery, MD  isosorbide mononitrate (IMDUR) 30 MG 24 hr tablet Take 1 tablet (30 mg total) by mouth daily. 04/04/20 07/03/20  Adrian Prows, MD  ketoconazole (NIZORAL) 2 % shampoo Apply topically. 01/01/20   [provider]  losartan (COZAAR) 50 MG tablet TAKE 1 TABLET BY MOUTH EVERY DAY 03/17/20   [name], [name], NP  nitroGLYCERIN (NITROSTAT) 0.4 MG SL tablet Place 1 tablet (0.4 mg total) under the tongue every 5 (five) minutes as needed for up to 25 days for chest pain. 04/04/20 04/29/20  Adrian Prows, MD  omeprazole (PRILOSEC) 40 MG capsule Take 1 capsule (40 mg total) by mouth 2 (two) times daily before a meal. 11/23/19   Mansouraty, Telford Nab., MD  oxybutynin (DITROPAN-XL) 10 MG 24  hr tablet Take 10 mg by mouth daily. 03/27/20   [provider]  pravastatin (PRAVACHOL) 40 MG tablet Take 1 tablet (40 mg total) by mouth at bedtime. 09/26/19   Adrian Prows, MD  sodium chloride (OCEAN) 0.65 % SOLN nasal spray Place 1 spray into both nostrils as needed for congestion.    [provider]  SYNTHROID 100 MCG tablet TAKE 1 TABLET BY MOUTH EVERY DAY BEFORE BREAKFAST 04/21/20   Virgie Dad, MD  Zolpidem Tartrate 3.5 MG SUBL Take 1 tablet by mouth at bedtime as needed. Dx K21.9 05/07/20   [name], [name], NP     Positive ROS: Otherwise negative  All other systems have been reviewed and were otherwise negative with the exception of those mentioned in the HPI and as above.  Physical Exam: Constitutional: Alert, well-appearing, no acute distress Ears: External ears without lesions or tenderness. Ear canals are clear bilaterally with intact, clear TMs.  Nasal: External nose without lesions. Septum is relatively midline.  She has mild rhinitis.  But clear nasal passages otherwise.  There is no crusting.  Middle meatus regions are clear bilaterally.  No obvious mucopurulent discharge noted.  No polyps noted. Oral: Lips and gums without lesions. Tongue and palate mucosa without lesions. Posterior oropharynx clear.  No significant postnasal drainage noted in the posterior oropharyngeal wall. Neck: No palpable adenopathy or masses Respiratory: Breathing comfortably  Skin: No facial/neck lesions or rash noted.  Procedures  Assessment: Chronic rhinitis. Questionable sinus disease.  Plan: Recommended continuation with the nasal steroid spray and saline rinses during the daytime as this will be the most beneficial treatment. I discussed with her today that nasal passages appear clear and she is having no difficulty breathing in the office  although she feels like she has difficulty breathing.  Some of her symptoms may be more environmental in nature than actually structurally abnormal. We will go ahead and schedule a CT scan of the sinuses to rule out any chronic sinus disease that may be missed.  She will call us concerning results of the CT scan after this is performed.   Radene Journey, MD

## 2020-05-12 NOTE — Progress Notes (Signed)
Modified Barium Swallow Progress Note  Patient Details  Name: Darlene [name] MRN: [id number] Date of Birth: Oct 26, 1933  Today's Date: 05/12/2020  Modified Barium Swallow completed.  Full report located under Chart Review in the Imaging Section.  Brief recommendations include the following:  Clinical Impression  Pt arrives for her first MBS after two esophagrams with finding of dysmotility as well and subsequent treatment for stricture. Pt demosntrates no oral or oropharyngeal dyspahgia. However, even with initial sips, prior to significant esophageal filling, pt had immediate coughing, throat clearing and report of feeling irritated. SHe coughed throughout the study without any laryngeal penetration or aspiration. She did have mild premature spillage not outside of normal limits and was observed to have esophageal stasis with solids (see radiologist report below). Pt reports many cough triggers beyond swallowing, such as talking, spicy foods etc. She also suspects she is exposed to many allergens. Pt is likely experiencing a hypersensitive cough response during swallowing given triggers including esophageal dysphagia. The act of frequent coughing and throat clearing increases inflammatory responses and can cause more coughing. Offered counsel on esophageal strategies to clear residue but also introduced concepts of awareness of the problem, cough supression techniques and recognizing environmental and behavioral triggers. Pt may benefit from f/u with OP SLP for therapeutic interventions that will target these techniques further.   Swallow Evaluation Recommendations       SLP Diet Recommendations: Regular solids;Thin liquid       Medication Administration: Whole meds with liquid   Supervision: Patient able to self feed   Compensations: Follow solids with liquid   Postural Changes: Seated upright at 90 degrees          Herbie Baltimore, MA CCC-SLP  Acute Rehabilitation Services Office  [phone]   Lynann Beaver 05/12/2020,1:02 PM

## 2020-05-13 ENCOUNTER — Encounter (HOSPITAL_COMMUNITY): Payer: Medicare Other

## 2020-05-13 ENCOUNTER — Ambulatory Visit (HOSPITAL_COMMUNITY): Payer: Medicare Other

## 2020-05-14 ENCOUNTER — Other Ambulatory Visit (INDEPENDENT_AMBULATORY_CARE_PROVIDER_SITE_OTHER): Payer: Self-pay

## 2020-05-14 DIAGNOSIS — J329 Chronic sinusitis, unspecified: Secondary | ICD-10-CM

## 2020-05-15 NOTE — Progress Notes (Signed)
Primary Physician/Referring:  [name], [name], NP  Patient ID: Darlene [name], female    DOB: 03/20/1933, 87 y.o.   MRN: [id number]  Chief Complaint  Patient presents with  . Chest Pain    3 weeks   HPI:    Darlene [name]  is a 87 y.o. Caucasian female with hypertension, hyperlipidemia, prediabetes, h/o sleep apnea not on CPAP, prior history of stroke in 2006 without residual defect, on Plavix chronically. She has had frequent palpitations, difficulty in controlling hypertension, extreme anxiety, multiple medication intolerances.  However with the present combination of medications, blood pressure has been well controlled and at last seen her in January 2022.  Patient presents for 3-week follow-up of chest pain.  At last visit started patient on isosorbide mononitrate, as needed sublingual nitroglycerin, and ordered nuclear stress test as well as echocardiogram.  Patient has had no recurrence of chest pain symptoms since last visit.  She is also had no pain radiating to her jaw.  She has not been taking previously prescribed isosorbide mononitrate, as she has had no chest discomfort symptoms.  She has not needed sublingual nitroglycerin.  Denies chest pain, palpitations, syncope, near syncope, dizziness, dyspnea.  Patient's primary concern is that she has been under stress lately due to maintenance issues with her apartment.  Past Medical History:  Diagnosis Date  . Anxiety   . Asthma 04/06/2018  . Colon polyps   . Depression   . Hyperlipidemia   . Hypertension   . Hypothyroidism   . Skin cancer   . Sleep apnea   . Stroke [facility]) 2008   Past Surgical History:  Procedure Laterality Date  . ADENOIDECTOMY    . APPENDECTOMY  1960  . CATARACT EXTRACTION Left   . COLONOSCOPY    . GUM SURGERY  03/2019  . [place] SURGERY  2014  . MELANOMA EXCISION  2020  . MESH APPLIED TO LAP PORT    . TONSILLECTOMY  1940   Family History  Problem Relation Age of Onset  . Lung cancer Mother 90        smoker  . Heart disease Mother   . Stroke Father 12  . Heart attack Father   . Allergic rhinitis Sister   . Hypertension Sister   . CVA Brother   . Lung cancer Maternal Grandfather   . Heart attack Paternal Grandfather   . Psoriasis Daughter   . Rheum arthritis Daughter   . Bipolar disorder Daughter   . Colon cancer Neg Hx   . Esophageal cancer Neg Hx   . Inflammatory bowel disease Neg Hx   . Liver disease Neg Hx   . Pancreatic cancer Neg Hx   . Stomach cancer Neg Hx   . Rectal cancer Neg Hx     Social History   Tobacco Use  . Smoking status: Never Smoker  . Smokeless tobacco: Never Used  Substance Use Topics  . Alcohol use: Yes    Comment: hardly ever   ROS   Review of Systems  Constitutional: Negative for malaise/fatigue and weight gain.  Cardiovascular: Negative for chest pain, claudication, dyspnea on exertion, leg swelling, near-syncope, orthopnea, palpitations, paroxysmal nocturnal dyspnea and syncope.  Respiratory: Negative for shortness of breath.   Hematologic/Lymphatic: Does not bruise/bleed easily.  Musculoskeletal: Positive for arthritis and joint pain. Negative for neck pain.  Gastrointestinal: Negative for melena.  Neurological: Negative for dizziness and weakness.  Psychiatric/Behavioral: The patient is not nervous/anxious.    Objective  Blood pressure 129/75,  pulse 74, temperature 98 F (36.7 C), temperature source Temporal, resp. rate 17, height 5\' 3"  (1.6 m), weight 158 lb 9.6 oz (71.9 kg), SpO2 96 %.  Vitals with BMI 05/16/2020 04/17/2020 04/11/2020  Height 5\' 3"  5\' 3"  5\' 3"   Weight 158 lbs 10 oz 159 lbs 159 lbs 3 oz  BMI 28.1 27.06 23.76  Systolic 283 151 761  Diastolic 75 80 80  Pulse 74 65 91  Physical Exam Vitals reviewed.  Constitutional:      Appearance: Normal appearance.  HENT:     Head: Normocephalic and atraumatic.  Cardiovascular:     Rate and Rhythm: Normal rate and regular rhythm.     Pulses: Normal pulses and intact distal  pulses.          Radial pulses are 2+ on the right side and 2+ on the left side.     Heart sounds: Normal heart sounds, S1 normal and S2 normal. No murmur heard. No gallop.      Comments: No JVD. Pulmonary:     Effort: Pulmonary effort is normal. No respiratory distress.     Breath sounds: Normal breath sounds. No wheezing, rhonchi or rales.  Musculoskeletal:        General: Normal range of motion.     Right lower leg: No edema.     Left lower leg: No edema.  Skin:    General: Skin is warm and dry.  Neurological:     Mental Status: She is alert.     Laboratory examination:   CMP Latest Ref Rng & Units 02/07/2020 12/26/2019 09/20/2019  Glucose 70 - 99 mg/dL 115(H) 114(H) 126(H)  BUN 8 - 23 mg/dL 15 14 15   Creatinine 0.44 - 1.00 mg/dL 0.78 0.80 0.70  Sodium 135 - 145 mmol/L 142 142 143  Potassium 3.5 - 5.1 mmol/L 3.8 4.3 4.3  Chloride 98 - 111 mmol/L 109 105 108  CO2 22 - 32 mmol/L 23 31 -  Calcium 8.9 - 10.3 mg/dL 9.5 10.0 -  Total Protein 6.1 - 8.1 g/dL - 7.0 -  Total Bilirubin 0.2 - 1.2 mg/dL - 0.5 -  Alkaline Phos 38 - 126 U/L - - -  AST 10 - 35 U/L - 16 -  ALT 6 - 29 U/L - 13 -   CBC Latest Ref Rng & Units 02/07/2020 12/26/2019 09/20/2019  WBC 4.0 - 10.5 K/uL 7.3 6.5 -  Hemoglobin 12.0 - 15.0 g/dL 14.3 14.5 14.6  Hematocrit 36.0 - 46.0 % 41.9 43.3 43.0  Platelets 150 - 400 K/uL 267 263 -   Lipid Panel     Component Value Date/Time   CHOL 180 12/26/2019 1053   TRIG 123 12/26/2019 1053   HDL 51 12/26/2019 1053   CHOLHDL 3.5 12/26/2019 1053   LDLCALC 107 (H) 12/26/2019 1053   HEMOGLOBIN A1C Lab Results  Component Value Date   HGBA1C 6.2 (H) 01/08/2020   MPG 131 01/08/2020   TSH Recent Labs    07/26/19 1041  TSH 0.74    High-sensitivity troponin 05/16/2020: <6  Medications and allergies   Allergies  Allergen Reactions  . Norco [Hydrocodone-Acetaminophen] Nausea And Vomiting  . Dust Mite Extract   . Oxycontin [Oxycodone Hcl] Nausea And Vomiting  . Pollen  Extract     Outpatient Medications Prior to Visit  Medication Sig Dispense Refill  . acetaminophen (TYLENOL) 325 MG tablet Take 325-650 mg by mouth every 6 (six) hours as needed for moderate pain (for sinus headache).    Marland Kitchen  azelastine (ASTELIN) 0.1 % nasal spray Place 2 sprays into both nostrils 2 (two) times daily. 30 mL 5  . clopidogrel (PLAVIX) 75 MG tablet TAKE 1 TABLET BY MOUTH EVERY DAY 90 tablet 1  . diazepam (VALIUM) 5 MG tablet Take 0.5 tablets (2.5 mg total) by mouth as needed (anxiety). 30 tablet 0  . fluticasone (FLONASE) 50 MCG/ACT nasal spray SPRAY 2 SPRAYS IN EACH NOSTRIL AT BEDTIME 48 mL 2  . ketoconazole (NIZORAL) 2 % shampoo Apply topically.    Marland Kitchen losartan (COZAAR) 50 MG tablet TAKE 1 TABLET BY MOUTH EVERY DAY 90 tablet 0  . nitroGLYCERIN (NITROSTAT) 0.4 MG SL tablet Place 1 tablet (0.4 mg total) under the tongue every 5 (five) minutes as needed for up to 25 days for chest pain. 25 tablet 3  . omeprazole (PRILOSEC) 40 MG capsule Take 1 capsule (40 mg total) by mouth 2 (two) times daily before a meal. 90 capsule 3  . oxybutynin (DITROPAN-XL) 10 MG 24 hr tablet Take 10 mg by mouth daily.    . pravastatin (PRAVACHOL) 40 MG tablet Take 1 tablet (40 mg total) by mouth at bedtime. 90 tablet 2  . sodium chloride (OCEAN) 0.65 % SOLN nasal spray Place 1 spray into both nostrils as needed for congestion.    Marland Kitchen SYNTHROID 100 MCG tablet TAKE 1 TABLET BY MOUTH EVERY DAY BEFORE BREAKFAST 90 tablet 1  . Zolpidem Tartrate 3.5 MG SUBL Take 1 tablet by mouth at bedtime as needed. Dx K21.9 30 tablet 0  . isosorbide mononitrate (IMDUR) 30 MG 24 hr tablet Take 1 tablet (30 mg total) by mouth daily. 30 tablet 2   No facility-administered medications prior to visit.    Radiology:   CXR 03/11/2019: Cardiac shadow is within normal limits. Aortic calcifications are seen. The lungs are clear. No bony abnormality is noted.  MRI brain 09/20/2019: Normal.  CT angiogram head 02/08/2020: 1. No emergent  finding or specific explanation for headache. 2. Remote right occipital infarction.  MR cervical spine 02/26/2020: 1. No acute osseous abnormality in the cervical spine. 2. Mild multifactorial degenerative spinal stenosis C4-C5 through C6-C7. Very mild cord mass effect but no cord signal abnormality. 3. Associated severe degenerative foraminal stenosis at the bilateral C5 nerve levels.  Up to moderate left C6 and left C7 nerve level foraminal stenosis  Cardiac Studies:   Stress Echocardiogram 2017/06/28:  Resting EKG demonstrated normal sinus rhythm. Stress EKG is negative for myocardial ischemia, patient exercised for 4:29 minutes, achieved 6.2 Mets. Normal blood pressure response. No evidence of ischemia. Stress echocardiogram: Normal LV systolic function, no stress induced wall motion abnormality. EF 75-80%.  Nocturnal oximetry 04/18/2019: SPO2 88% for 6 minutes SPO2 less than 89% 11 minutes. Oxygen desaturation events 03/26/1940.  Desaturation index 42.  Minimum heart rate 54, maximum heart rate 89 bpm, average heart rate 60 bpm.  Percentage in bradycardia 66%.  Highest SPO2 99%, lowest SPO2 84%. Impression: Patient qualifies for nocturnal oxygen supplementation per Medicare guidelines Group 1.  Remote outpatient cardiac telemetry 04/24/2019 through 05/08/2019: Predominant rhythm is normal sinus rhythm.  Minimum heart rate 49, average heart rate 72 and maximum heart rate 195 bpm. PVC burden <1%.  There were 0 ventricular couplets and 0 ventricular runs. PAC burden <1%.  There was 1 supraventricular run of 5 beats.  No atrial fibrillation.  No heart block. 12 patient activated events correlated with sinus rhythm.  PCV MYOCARDIAL PERFUSION WO LEXISCAN 04/14/2020 Normal ECG stress. The patient exercised for 5  minutes and 0 seconds of a Bruce protocol, achieving approximately 7.03 METs.  Normal BP response. Reduced exercise tolerance. Non-limiting chest pain with exercise. Myocardial  perfusion is normal. Overall LV systolic function is normal without regional wall motion abnormalities. Stress LV EF: 63%. No previous exam available for comparison. Low risk.  PCV ECHOCARDIOGRAM COMPLETE [id number] Normal LV systolic function with visual EF 60-65%. Left ventricle cavity is normal in size. Normal global wall motion. Normal diastolic filling pattern, normal LAP. Mild tricuspid regurgitation. Compared to prior study dated 04/24/2019: no significant change.  EKG   EKG 04/04/2020: Normal sinus rhythm at rate of 69 bpm, normal axis.  Incomplete right bundle branch block.  Nonspecific T abnormality.    EKG 02/07/2020: Normal sinus rhythm at rate of 74 bpm, normal axis, poor R wave progression, probably normal variant.  Low-voltage.  EKG 04/12/2019: Normal sinus rhythm with rate of 75 bpm, left atrial enlargement, normal axis.  Nonspecific anterolateral T wave normality, cannot exclude ischemia.  Compared to 12/21/2017, no significant change.  Assessment     ICD-10-CM   1. Atypical chest pain  R07.89   2. Essential hypertension  I10   3. Pure hypercholesterolemia  E78.00 Lipid Panel With LDL/HDL Ratio    Lipid Panel With LDL/HDL Ratio    Meds ordered this encounter  Medications  . ezetimibe (ZETIA) 10 MG tablet    Sig: Take 1 tablet (10 mg total) by mouth daily.    Dispense:  30 tablet    Refill:  3    Medications Discontinued During This Encounter  Medication Reason  . isosorbide mononitrate (IMDUR) 30 MG 24 hr tablet Error    Orders Placed This Encounter  Procedures  . Lipid Panel With LDL/HDL Ratio    Standing Status:   Future    Number of Occurrences:   1    Standing Expiration Date:   11/15/2020    Recommendations:   Darlene [name]  is a  87 y.o. Caucasian female with hypertension, hyperlipidemia, h/o sleep apnea not on CPAP, prior history of stroke in 2006 without residual defect, on Plavix chronically. She has had frequent palpitations, difficulty in  controlling hypertension, extreme anxiety, multiple medication intolerances.  However with the present combination of medications, blood pressure has been well controlled and at last seen her in January 2022.  Patient presents for 3-week follow-up of chest pain.  At last visit started patient on isosorbide mononitrate, as needed sublingual nitroglycerin, and ordered nuclear stress test as well as echocardiogram.  Patient is not taking isosorbide mononitrate as she has had no recurrence of chest pain symptoms since last visit.  Reviewed and discussed results of stress test, echocardiogram, and troponin, details above.  Stress test was low risk and echocardiogram normal.  Troponin was negative.  Do not suspect cardiovascular etiology of patient's previous symptomatic episode.  Again discussed use of as needed sublingual nitroglycerin, patient verbalized understanding.  Review of recent lipid panel shows LDL of 107.  Discussed with patient options to improve lipid control including switching from pravastatin to atorvastatin versus adding Zetia to pravastatin.  Shared decision was to proceed with addition of Zetia 10 mg daily, and continue pravastatin 40 mg daily.  We will repeat lipid profile testing in 3 months.  Blood pressure is well controlled.  Patient is otherwise stable from a cardiovascular standpoint.  Advised her she may return to daily activities as normal.   Follow-up in 6 months, sooner if needed, for hypertension, hyperlipidemia.   Alethia Berthold,  PA-C 05/16/2020, 10:13 AM Office: [phone]

## 2020-05-16 ENCOUNTER — Other Ambulatory Visit: Payer: Self-pay

## 2020-05-16 ENCOUNTER — Encounter: Payer: Self-pay | Admitting: Student

## 2020-05-16 ENCOUNTER — Ambulatory Visit: Payer: Medicare Other | Admitting: Student

## 2020-05-16 VITALS — BP 129/75 | HR 74 | Temp 98.0°F | Resp 17 | Ht 63.0 in | Wt 158.6 lb

## 2020-05-16 DIAGNOSIS — R0789 Other chest pain: Secondary | ICD-10-CM

## 2020-05-16 DIAGNOSIS — E7800 Pure hypercholesterolemia, unspecified: Secondary | ICD-10-CM

## 2020-05-16 DIAGNOSIS — I10 Essential (primary) hypertension: Secondary | ICD-10-CM

## 2020-05-16 MED ORDER — EZETIMIBE 10 MG PO TABS: 10.0000 mg | ORAL_TABLET | Freq: Every day | ORAL | 3 refills | Status: DC

## 2020-05-16 NOTE — Patient Instructions (Signed)
Go to [facility] in 3 months for blood work.

## 2020-05-17 ENCOUNTER — Ambulatory Visit
Admission: RE | Admit: 2020-05-17 | Discharge: 2020-05-17 | Disposition: A | Discharge status: Home / Self Care | Payer: Medicare Other | Source: Ambulatory Visit | Attending: Otolaryngology | Admitting: Otolaryngology

## 2020-05-17 DIAGNOSIS — J329 Chronic sinusitis, unspecified: Secondary | ICD-10-CM

## 2020-05-17 IMAGING — CT CT MAXILLOFACIAL W/O CM
1 series · 15 of 30 positions shown, 19 images · non-contrast
Comparison: None.

CLINICAL DATA: Chronic sinusitis

EXAM:
CT MAXILLOFACIAL WITHOUT CONTRAST
TECHNIQUE: Multidetector CT imaging of the maxillofacial structures was
performed. Multiplanar CT image reconstructions were also generated.

[Series 4: maxofacial soft · axial · 0.37mm/px · z∈[+39,+147]mm · 15 of 58 slices shown, 19 images]
[im 2/58  brain]
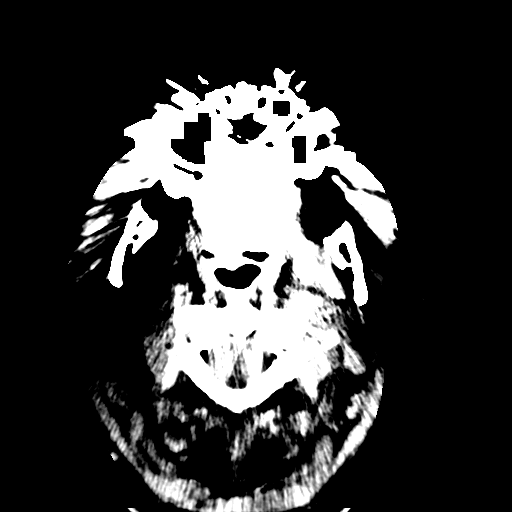
[im 2/58  bone]
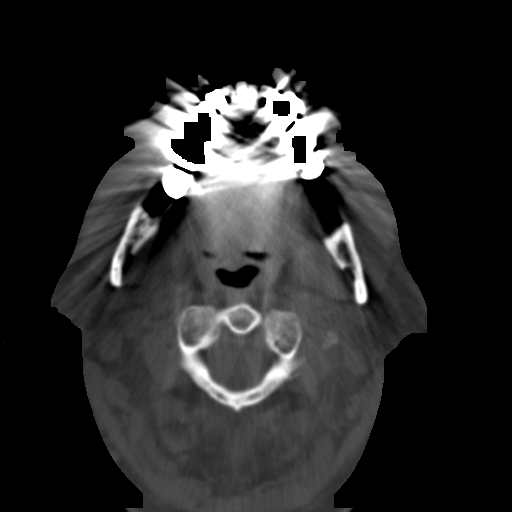
[im 6/58  bone]
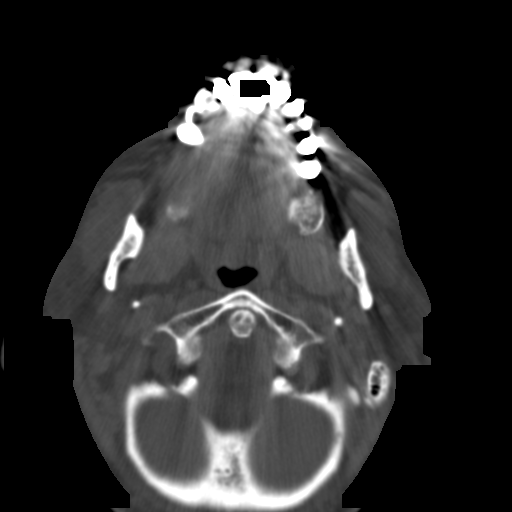
[im 10/58  bone]
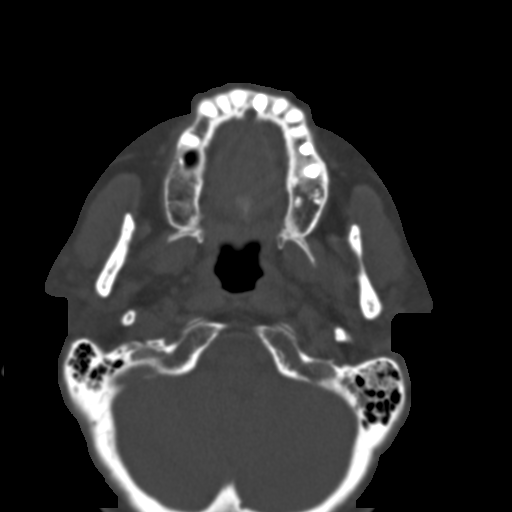
[im 14/58  bone]
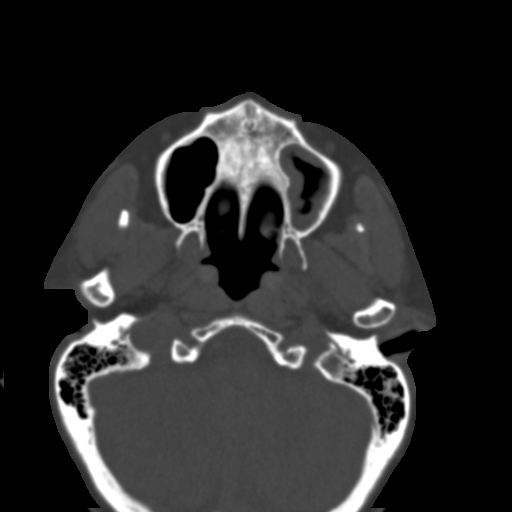
[im 18/58  brain]
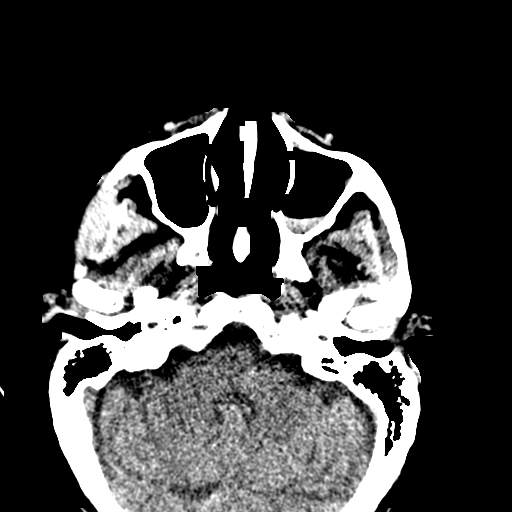
[im 18/58  bone]
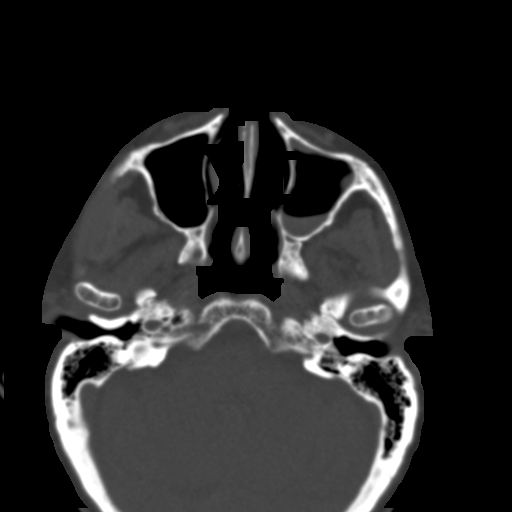
[im 22/58  bone]
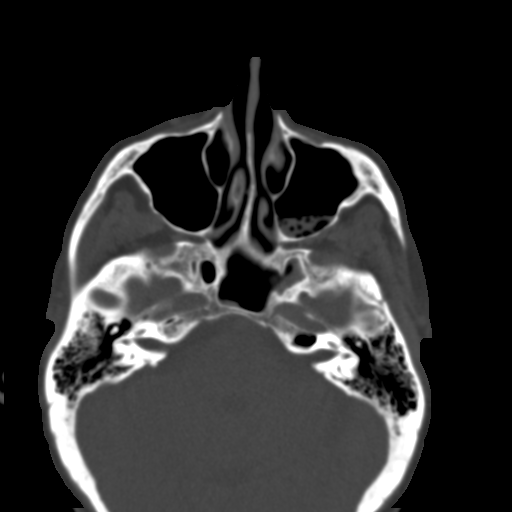
[im 26/58  bone]
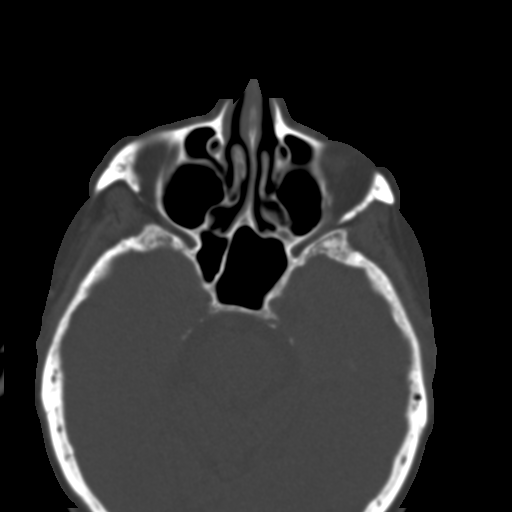
[im 30/58  bone]
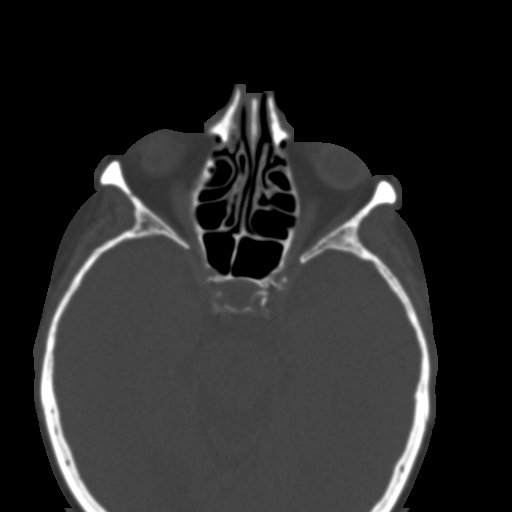
[im 32/58  brain]
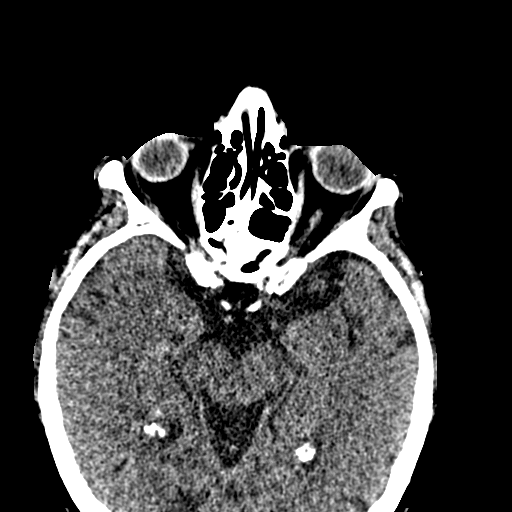
[im 32/58  bone]
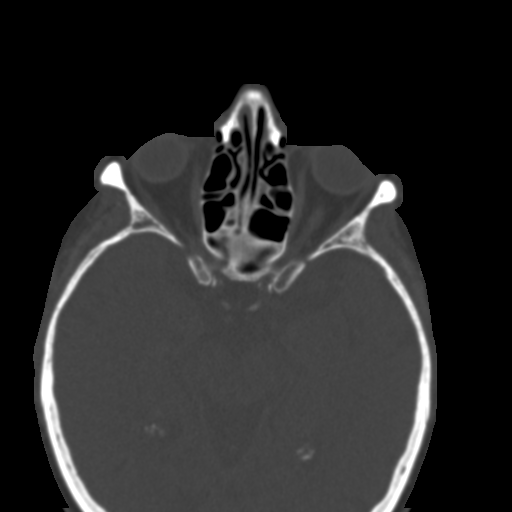
[im 36/58  bone]
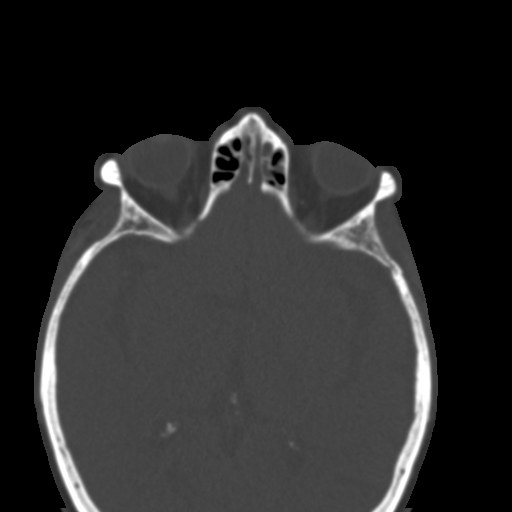
[im 40/58  bone]
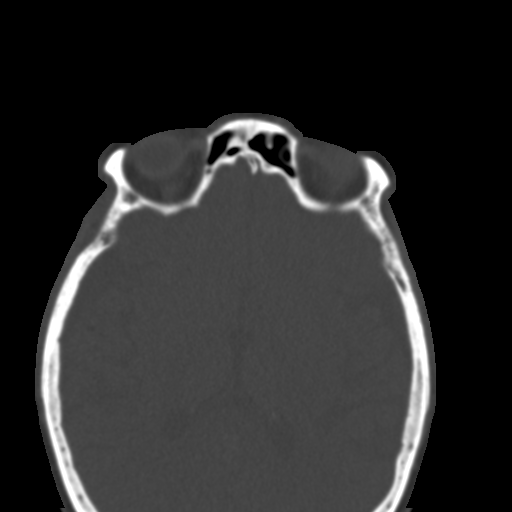
[im 44/58  bone]
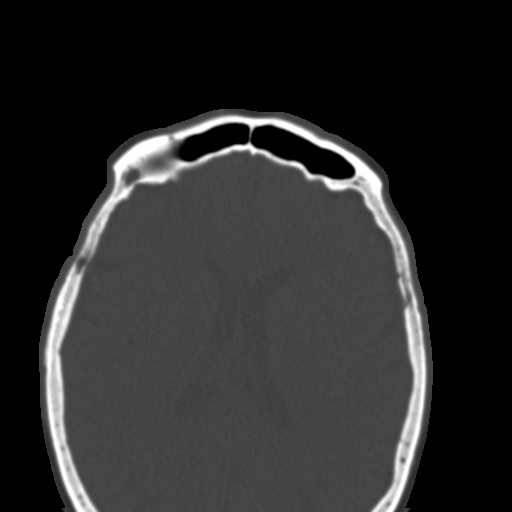
[im 48/58  brain]
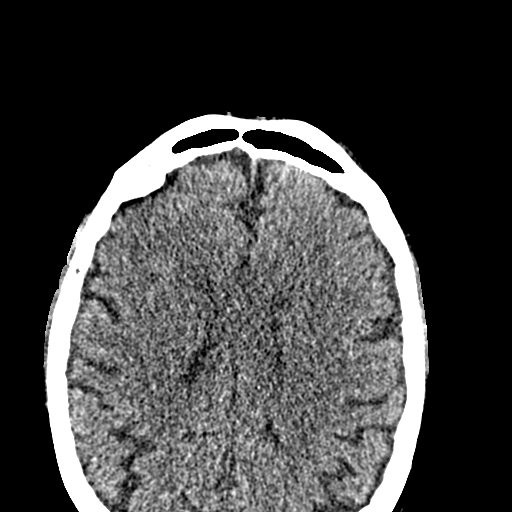
[im 48/58  bone]
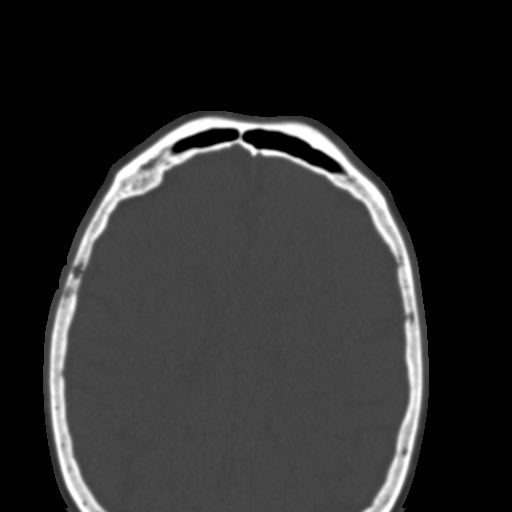
[im 52/58  bone]
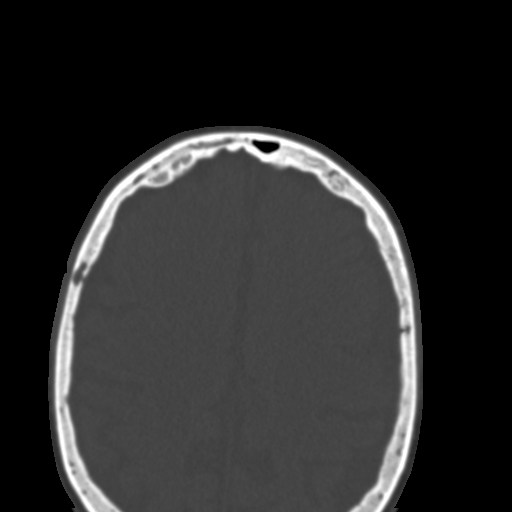
[im 56/58  bone]
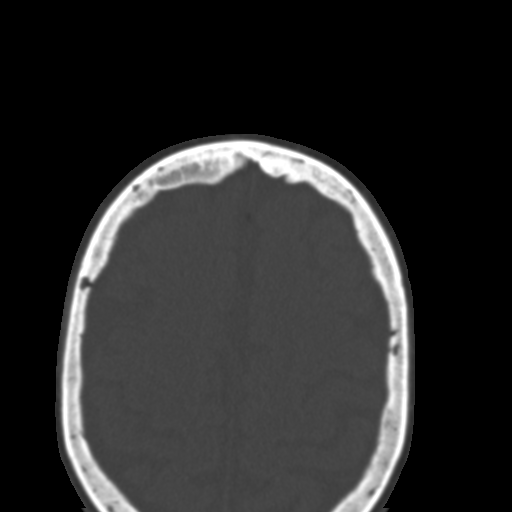

[15 of 30 positions shown; findings below may reference images not displayed]

FINDINGS: Paranasal sinuses:

Frontal: Normally aerated. Patent frontal sinus drainage pathways.

Ethmoid: Minor mucosal thickening.

Maxillary: Right sinus is aerated. There is moderate mucosal
thickening at the alveolar recess on the left.

Sphenoid: Normally aerated. Patent sphenoethmoidal recesses.

Right ostiomeatal unit: Patent.

Left ostiomeatal unit: Infundibulum is partially occluded. Otherwise
patent.

Nasal passages: Patent. Intact nasal septum is slightly deviated to
the left.

Anatomy: Cribriform plate and fovea ethmoidalis are intact and
slightly higher on the left. Lateral lamella measures up to 7 mm.
Sellar sphenoid pneumatization pattern. No dehiscence of carotid or
optic canals. No onodi cell.

Other: Mastoid air cells are clear. Middle ears are aerated.
Temporomandibular joints are unremarkable. Limited intracranial
imaging demonstrates no acute abnormality.
IMPRESSION: Overall mild paranasal sinus inflammatory changes. Partial occlusion
of the left infundibulum. Drainage pathways otherwise patent.

## 2020-05-22 ENCOUNTER — Other Ambulatory Visit (INDEPENDENT_AMBULATORY_CARE_PROVIDER_SITE_OTHER): Payer: Self-pay

## 2020-05-22 MED ORDER — AZELASTINE HCL 0.1 % NA SOLN: 2.0000 | Freq: Two times a day (BID) | NASAL | 5 refills | Status: DC

## 2020-05-22 NOTE — Progress Notes (Signed)
Rx Refill

## 2020-05-23 NOTE — Telephone Encounter (Signed)
Called patient concerning recent CT scan of the sinuses.  This showed predominantly clear paranasal sinuses except for mild left maxillary sinus disease with slight mucoperiosteal thickening and a small amount of mucus within the left maxillary sinus.  The maxillary sinus ostia is patent.  Remaining sinuses are relatively clear. She complains of chronic postnasal drainage as well as exposure where she lives to questionable fungus and debris. Again recommended continuation of the nasal steroid spray and frequent saline rinses to help clear the nose.  She can take antihistamines as needed. Otherwise concerning where she lives she will have to address this with her manager of her living space. She will follow-up here as needed.

## 2020-05-28 ENCOUNTER — Other Ambulatory Visit: Payer: Self-pay | Admitting: *Deleted

## 2020-05-28 MED ORDER — LOSARTAN POTASSIUM 50 MG PO TABS: 1.0000 | ORAL_TABLET | Freq: Every day | ORAL | 1 refills | Status: DC

## 2020-05-28 NOTE — Telephone Encounter (Signed)
Patient called and requested refill. Stated that she is going out of town today and needs it sent to [facility].

## 2020-05-29 ENCOUNTER — Ambulatory Visit (INDEPENDENT_AMBULATORY_CARE_PROVIDER_SITE_OTHER): Payer: Medicare Other | Admitting: Otolaryngology

## 2020-06-24 ENCOUNTER — Other Ambulatory Visit: Payer: Self-pay | Admitting: Gastroenterology

## 2020-06-26 ENCOUNTER — Other Ambulatory Visit: Payer: Self-pay | Admitting: Cardiology

## 2020-06-26 DIAGNOSIS — E7800 Pure hypercholesterolemia, unspecified: Secondary | ICD-10-CM

## 2020-06-29 ENCOUNTER — Other Ambulatory Visit: Payer: Self-pay | Admitting: Cardiology

## 2020-06-29 ENCOUNTER — Other Ambulatory Visit: Payer: Self-pay | Admitting: Family Medicine

## 2020-06-29 DIAGNOSIS — I201 Angina pectoris with documented spasm: Secondary | ICD-10-CM

## 2020-07-14 ENCOUNTER — Telehealth: Payer: Self-pay | Admitting: Gastroenterology

## 2020-07-14 NOTE — Telephone Encounter (Signed)
Inbound call from patient. Patient does not believe she need omeprazole anymore and need to speak with someone about it. States if it is needed, she would like to cut back to once a day. Best contact number [phone]

## 2020-07-14 NOTE — Telephone Encounter (Signed)
Left message

## 2020-07-24 NOTE — Telephone Encounter (Signed)
Patient returned call. Pt states that she went off of Omeprazole for a couple of days and determine that she does indeed need to be taking Omeprazole. Symptoms of gerd were increased. Pt states that she re-started Omeprazole. Feeling much better, okay on refills. Pt will call us back if needed.

## 2020-07-28 ENCOUNTER — Other Ambulatory Visit: Payer: Self-pay | Admitting: *Deleted

## 2020-07-28 DIAGNOSIS — K219 Gastro-esophageal reflux disease without esophagitis: Secondary | ICD-10-CM

## 2020-07-28 MED ORDER — ZOLPIDEM TARTRATE 3.5 MG SL SUBL: 1.0000 | SUBLINGUAL_TABLET | Freq: Every evening | SUBLINGUAL | 0 refills | Status: DC | PRN

## 2020-07-28 NOTE — Telephone Encounter (Signed)
Patient called requesting refill Pended Rx and sent to [facility] for approval.

## 2020-07-29 ENCOUNTER — Ambulatory Visit: Payer: Medicare Other | Admitting: Orthopaedic Surgery

## 2020-07-29 ENCOUNTER — Encounter: Payer: Self-pay | Admitting: Orthopaedic Surgery

## 2020-07-29 ENCOUNTER — Encounter: Payer: Self-pay | Admitting: Physician Assistant

## 2020-07-29 ENCOUNTER — Other Ambulatory Visit: Payer: Self-pay

## 2020-07-29 ENCOUNTER — Ambulatory Visit: Payer: Medicare Other | Admitting: Physician Assistant

## 2020-07-29 VITALS — BP 130/81 | HR 66 | Ht 63.0 in | Wt 157.4 lb

## 2020-07-29 DIAGNOSIS — D2262 Melanocytic nevi of left upper limb, including shoulder: Secondary | ICD-10-CM | POA: Diagnosis not present

## 2020-07-29 DIAGNOSIS — L814 Other melanin hyperpigmentation: Secondary | ICD-10-CM | POA: Diagnosis not present

## 2020-07-29 DIAGNOSIS — D229 Melanocytic nevi, unspecified: Secondary | ICD-10-CM

## 2020-07-29 DIAGNOSIS — L578 Other skin changes due to chronic exposure to nonionizing radiation: Secondary | ICD-10-CM

## 2020-07-29 DIAGNOSIS — L821 Other seborrheic keratosis: Secondary | ICD-10-CM

## 2020-07-29 DIAGNOSIS — D1800 Hemangioma unspecified site: Secondary | ICD-10-CM

## 2020-07-29 DIAGNOSIS — J302 Other seasonal allergic rhinitis: Secondary | ICD-10-CM

## 2020-07-29 DIAGNOSIS — J3089 Other allergic rhinitis: Secondary | ICD-10-CM | POA: Diagnosis not present

## 2020-07-29 DIAGNOSIS — Z1283 Encounter for screening for malignant neoplasm of skin: Secondary | ICD-10-CM

## 2020-07-29 DIAGNOSIS — M4722 Other spondylosis with radiculopathy, cervical region: Secondary | ICD-10-CM

## 2020-07-29 DIAGNOSIS — D485 Neoplasm of uncertain behavior of skin: Secondary | ICD-10-CM

## 2020-07-29 DIAGNOSIS — Z808 Family history of malignant neoplasm of other organs or systems: Secondary | ICD-10-CM

## 2020-07-29 NOTE — Progress Notes (Signed)
Office Visit Note   Patient: Darlene [name]           Date of Birth: October 28, 1933           MRN: [id number] Visit Date: 07/29/2020              Requested by: [name], [name], NP [address],  [place] [address] PCP: [name], [name], NP   Assessment & Plan: Visit Diagnoses:  1. Seasonal and perennial allergic rhinitis   2. Other spondylosis with radiculopathy, cervical region     Plan: Today's visit we reviewed cervical MRI scan and I gave her a copy of the report.  Her problems seem to be sinus related.  She did report that in the past Dr. Lucia Gaskins put something in her ears that tend to help for a few weeks but then it comes back.  When she uses her Nettie pot this helped for a while but then several hours later she has similar symptoms.  She can return if she develops radicular symptoms or increased neck pain problems.  Follow-Up Instructions: No follow-ups on file.   Orders:  No orders of the defined types were placed in this encounter.  No orders of the defined types were placed in this encounter.     Procedures: No procedures performed   Clinical Data: No additional findings.   Subjective: Chief Complaint  Patient presents with   Neck - Pain    87 year old female with noticed some popping in her neck with rotation which is not painful and associated with problems with increased mucus which has been a longstanding problem for her.  States she took an airplane trip and feels like she needs to have her ears pop but they will not pop.  States she has to get up in the melanite and really brush her teeth because she has mucus buildup around her teeth.  She has seen Dr. Radene Journey several times also had swallowing study done.  Maxillofacial CT showed slight sinus thickening but no specific problems.  Cervical MRI scan does show some narrowing at 2 levels without severe stenosis.  No radicular arm symptoms no myelopathic symptoms.  She does note she has some tingling in her  feet at times.  She states when she was in [place] ,[place] she had no problems with her sinuses.  Patient states she uses a Nettie pot and does not have an appointment with Dr. Lucia Gaskins until August.  Review of Systems all other systems noncontributory to HPI.   Objective: Vital Signs: BP 130/81   Pulse 66   Ht 5\' 3"  (1.6 m)   Wt 157 lb 6.4 oz (71.4 kg)   BMI 27.88 kg/m   Physical Exam Constitutional:      Appearance: She is well-developed.  HENT:     Head: Normocephalic.     Right Ear: External ear normal.     Left Ear: External ear normal. There is no impacted cerumen.  Eyes:     Pupils: Pupils are equal, round, and reactive to light.  Neck:     Thyroid: No thyromegaly.     Trachea: No tracheal deviation.  Cardiovascular:     Rate and Rhythm: Normal rate.  Pulmonary:     Effort: Pulmonary effort is normal.  Abdominal:     Palpations: Abdomen is soft.  Musculoskeletal:     Cervical back: No rigidity.  Skin:    General: Skin is warm and dry.  Neurological:  Mental Status: She is alert and oriented to person, place, and time.  Psychiatric:        Behavior: Behavior normal.    Ortho Exam patient has good rotation of her neck negative Spurling no rash over exposed skin no upper extremity atrophy.  Specialty Comments:  No specialty comments available.  Imaging: No results found.   PMFS History: Patient Active Problem List   Diagnosis Date Noted   Other spondylosis with radiculopathy, cervical region 02/19/2020   Prediabetes 01/09/2020   Adverse reaction to COVID-19 vaccine 11/29/2019   Chronic cough 11/24/2019   Melanoma of skin ([place]) 08/24/2019   Palpitations 06/07/2019   Schatzki's ring 05/12/2019   Hiatal hernia 05/12/2019   Insomnia 03/29/2019   Back pain with left-sided sciatica 03/01/2019   Daytime sleepiness 11/30/2018   History of colonic polyps 09/03/2018   Dysphagia 09/03/2018   Pyrosis 09/03/2018   Chronic sore throat 09/03/2018    Actinic keratoses 02/23/2018   Seasonal and perennial allergic rhinitis 01/31/2018   Incontinent of urine 12/01/2017   Essential hypertension 09/06/2017   History of CVA (cerebrovascular accident) 09/06/2017   Hyperlipidemia 09/06/2017   GAD (generalized anxiety disorder) 09/06/2017   Acquired hypothyroidism 09/06/2017   Chronic sinusitis 09/06/2017   Gastroesophageal reflux disease 09/06/2017   Past Medical History:  Diagnosis Date   Anxiety    Asthma 04/06/2018   Colon polyps    Depression    Hyperlipidemia    Hypertension    Hypothyroidism    Melanoma in situ ([place]) 09/19/2018   Right Arm (excision)   Skin cancer    Sleep apnea    Stroke [facility]) 2008    Family History  Problem Relation Age of Onset   Lung cancer Mother 32       smoker   Heart disease Mother    Stroke Father 42   Heart attack Father    Allergic rhinitis Sister    Hypertension Sister    CVA Brother    Lung cancer Maternal Grandfather    Heart attack Paternal Grandfather    Melanoma Daughter    Psoriasis Daughter    Rheum arthritis Daughter    Bipolar disorder Daughter    Colon cancer Neg Hx    Esophageal cancer Neg Hx    Inflammatory bowel disease Neg Hx    Liver disease Neg Hx    Pancreatic cancer Neg Hx    Stomach cancer Neg Hx    Rectal cancer Neg Hx     Past Surgical History:  Procedure Laterality Date   ADENOIDECTOMY     APPENDECTOMY  1960   CATARACT EXTRACTION Left    COLONOSCOPY     GUM SURGERY  03/2019   LUMBAR [place] SURGERY  2014   MELANOMA EXCISION  2020   MESH APPLIED TO LAP PORT     TONSILLECTOMY  1940   Social History   Occupational History   Occupation: retired Radiographer, therapeutic  Tobacco Use   Smoking status: Never   Smokeless tobacco: Never  Vaping Use   Vaping Use: Never used  Substance and Sexual Activity   Alcohol use: Yes    Comment: hardly ever   Drug use: Never   Sexual activity: Not on file

## 2020-07-30 ENCOUNTER — Encounter: Payer: Self-pay | Admitting: Physician Assistant

## 2020-07-30 NOTE — Progress Notes (Signed)
   Follow-Up Visit   Subjective  Darlene [name] is a 87 y.o. female who presents for the following: Follow-up (Patient here today for skin check. Per patient she just wants to make sure she doesn't have any new skin cancers. Patient does have a personal history of melanoma. Per patient her daughter does have a history of melanoma.).   The following portions of the chart were reviewed this encounter and updated as appropriate:       Objective  Well appearing patient in no apparent distress; mood and affect are within normal limits.  A full examination was performed including scalp, head, eyes, ears, nose, lips, neck, chest, axillae, abdomen, back, buttocks, bilateral upper extremities, bilateral lower extremities, hands, feet, fingers, toes, fingernails, and toenails. All findings within normal limits unless otherwise noted below.  Left Upper Arm - Posterior Stuck-on, waxy brown crusty plaques.   Left Forearm - Posterior Bichromic dark nested macule.         Assessment & Plan  Seborrheic keratosis Left Upper Arm - Posterior  observe  Neoplasm of uncertain behavior of skin Left Forearm - Posterior  Skin / nail biopsy Type of biopsy: tangential   Informed consent: discussed and consent obtained   Timeout: patient name, date of birth, surgical site, and procedure verified   Procedure prep:  Patient was prepped and draped in usual sterile fashion (Non sterile) Prep type:  Chlorhexidine Anesthesia: the lesion was anesthetized in a standard fashion   Anesthetic:  1% lidocaine w/ epinephrine 1-100,000 local infiltration Instrument used: flexible razor blade   Hemostasis achieved with: aluminum chloride and electrodesiccation   Outcome: patient tolerated procedure well   Post-procedure details: sterile dressing applied and wound care instructions given   Dressing type: bandage and petrolatum    Specimen 1 - Surgical pathology Differential Diagnosis: R/O Atypia - cautery  after biopsy  Check Margins: Yes  Lentigines - Scattered tan macules - Discussed due to sun exposure - Benign, observe - Call for any changes  Melanocytic Nevi - Tan-brown and/or pink-flesh-colored symmetric macules and papules - Benign appearing on exam today - Observation - Call clinic for new or changing moles - Recommend daily use of broad spectrum spf 30+ sunscreen to sun-exposed areas.   Hemangiomas - Red papules - Discussed benign nature - Observe - Call for any changes  Actinic Damage - diffuse scaly erythematous macules with underlying dyspigmentation - Recommend daily broad spectrum sunscreen SPF 30+ to sun-exposed areas, reapply every 2 hours as needed.  - Call for new or changing lesions.  Skin cancer screening performed today.   I, Mikell Camp, PA-C, have reviewed all documentation's for this visit.  The documentation on 07/30/20 for the exam, diagnosis, procedures and orders are all accurate and complete.

## 2020-07-31 ENCOUNTER — Other Ambulatory Visit: Payer: Self-pay

## 2020-07-31 ENCOUNTER — Ambulatory Visit: Payer: Medicare Other | Admitting: Family

## 2020-07-31 ENCOUNTER — Encounter: Payer: Self-pay | Admitting: Family

## 2020-07-31 VITALS — BP 130/76 | HR 80 | Temp 97.5°F | Resp 16 | Ht 63.0 in | Wt 158.6 lb

## 2020-07-31 DIAGNOSIS — R062 Wheezing: Secondary | ICD-10-CM | POA: Diagnosis not present

## 2020-07-31 DIAGNOSIS — J302 Other seasonal allergic rhinitis: Secondary | ICD-10-CM

## 2020-07-31 DIAGNOSIS — J3089 Other allergic rhinitis: Secondary | ICD-10-CM

## 2020-07-31 MED ORDER — FLUTICASONE PROPIONATE 50 MCG/ACT NA SUSP: 1.0000 | Freq: Every day | NASAL | 5 refills | Status: DC

## 2020-07-31 MED ORDER — AZELASTINE HCL 0.1 % NA SOLN: NASAL | 5 refills | Status: DC

## 2020-07-31 MED ORDER — ALBUTEROL SULFATE HFA 108 (90 BASE) MCG/ACT IN AERS: 2.0000 | INHALATION_SPRAY | Freq: Four times a day (QID) | RESPIRATORY_TRACT | 1 refills | Status: DC | PRN

## 2020-07-31 NOTE — Patient Instructions (Addendum)
1. Seasonal and perennial allergic rhinitis (trees, weeds, grasses, indoor molds, outdoor molds, dust mites, cat, dog and cockroach) -We will hold off on using antihistamines due to them causing grogginess. We will also hold off on montelukast due to you trying it in the past and stopping for unknown reason - Continue with: fluticasone nasal spray 1-2 sprays each nostril once a day and Astelin (azelastine) 2 sprays per nostril 1-2 times daily as needed. Try to use azelastine more consistently Continue to follow up with Dr. Lucia Gaskins (ENT)   2. Possible asthma/wheeze - Lung testing looks great today and I am unsure if you have asthma,.   - Daily controller medication(s): none - Prior to physical activity: albuterol 2 puffs 10-15 minutes before physical activity. - Rescue medications: albuterol 4 puffs every 4-6 hours as needed - Asthma control goals: * Full participation in all desired activities (may need albuterol before activity) * Albuterol use two time or less a week on average (not counting use with activity) * Cough interfering with sleep two time or less a month * Oral steroids no more than once a year * No hospitalizations   Please let us know if this treatment plan is not working well for you. Schedule a follow up appointment in 6 weeks with Dr. Ernst Bowler

## 2020-07-31 NOTE — Progress Notes (Signed)
[address] [place] [id number] Dept: [id number]  FOLLOW UP NOTE  Patient ID: Darlene [name], female    DOB: [birth year]  Age: 87 y.o. MRN: [id number] Date of Office Visit: 07/31/2020  Assessment  Chief Complaint: Allergic Rhinitis  (Congestion, drainage down her throat, stuffy ears - mucus is thick and ranges in color (it gets stuck in her teeth) ) and Sinus Problem (Caused balance issues, inner ear issues, and congestion only have these issues in [place].in her apartment she has "popcorn ceiling" dust would fall all the time after a year they finally came and fixed it. )  HPI Darlene [name] is a 87 year old female who presents today for an acute visit.  She was last seen on February 28, 2018 by Dr. Ernst Bowler for seasonal and perennial allergic rhinitis and possible asthma.  Seasonal and perennial allergic rhinitis is reported as not well controlled with Flonase once a day, sinus rinse once to twice a day and azelastine nasal spray as needed.  She reports that she has tried most over-the-counter antihistamines and they cause her to be groggy at the wrong time.  She also reports that she has tried montelukast in the past and is not sure why she stopped it.  She has also tried Mucinex and went off it but is not sure why.  She reports a lot of sneezing, a lot of nasal congestion, a lot of congestion in her ear -especially her right ear is worse.  She also reports white postnasal drip that is occasionally light yellow.  She denies any fever, chills, sinus tenderness, and sinus pressure.  She reports that her symptoms really did not start bothering her until a year ago when she was at a periodontist office and was getting worked on and heard him tell the nurse that he got close to her sinuses.  Since then she has had problems with her balance and has had a lot of drainage down her throat.  She has gone to therapy for her balance problems.  She also wonders if the white stuff on her ceiling that  falls and drops is causing the white drainage down her throat.  She reports that she sees Dr. Lucia Gaskins, ENT, and tried to get an appointment with him but could not get an appointment until August.  She reports that her symptoms do not bother her when she is in [place].  She has not ever had sinus surgery.  She is not certain of how many sinus infection she has had this year.  About 8 to 9 years ago she did allergy injections for about 3 summers while she was in [place].  She has had sinus imaging on May 17, 2020 that was ordered by Dr. Lucia Gaskins that shows: "CLINICAL DATA:  Chronic sinusitis   EXAM: CT MAXILLOFACIAL WITHOUT CONTRAST   TECHNIQUE: Multidetector CT imaging of the maxillofacial structures was performed. Multiplanar CT image reconstructions were also generated.   COMPARISON:  None.   FINDINGS: Paranasal sinuses:   Frontal: Normally aerated. Patent frontal sinus drainage pathways.   Ethmoid: Minor mucosal thickening.   Maxillary: Right sinus is aerated. There is moderate mucosal thickening at the alveolar recess on the left.   Sphenoid: Normally aerated. Patent sphenoethmoidal recesses.   Right ostiomeatal unit: Patent.   Left ostiomeatal unit: Infundibulum is partially occluded. Otherwise patent.   Nasal passages: Patent. Intact nasal septum is slightly deviated to the left.   Anatomy: Cribriform plate and fovea ethmoidalis are intact and slightly higher on  the left. Lateral lamella measures up to 7 mm. Sellar sphenoid pneumatization pattern. No dehiscence of carotid or optic canals. No onodi cell.   Other: Mastoid air cells are clear. Middle ears are aerated. Temporomandibular joints are unremarkable. Limited intracranial imaging demonstrates no acute abnormality.   IMPRESSION: Overall mild paranasal sinus inflammatory changes. Partial occlusion of the left infundibulum. Drainage pathways otherwise patent.     Electronically Signed   By: Macy Mis  M.D.   Possible asthma is reported as moderately controlled with albuterol as needed.  She reports that her albuterol is very old.  She is no longer using Qvar 80 mcg 1 puff twice a day.  She reports coughing a lot due to postnasal drip.  She also reports wheezing at times and trouble breathing due to her nose being stopped up.  She denies any tightness in her chest.  Since her last office visit she has not required any systemic steroids, but reports that she has been to the emergency room at least 3-4 times due to having a hard time breathing through her nose.   Drug Allergies:  Allergies  Allergen Reactions   Norco [Hydrocodone-Acetaminophen] Nausea And Vomiting   Dust Mite Extract    Oxycontin [Oxycodone Hcl] Nausea And Vomiting   Pollen Extract     Review of Systems: Review of Systems  Constitutional:  Negative for chills and fever.  HENT:         Reports sneezing, nasal congestion, stuffy ear-especially the right side, and white postnasal drip.  Denies sinus tenderness and sinus pressure.  Eyes:        Reports dry eyes.  She reports that her doctor put plugs in a week ago and she has drops for this also  Respiratory:  Positive for cough, shortness of breath and wheezing.        Reports a lot of coughing due to drainage, wheezing at times, and shortness of breath due to trouble breathing through her nose  Cardiovascular:  Negative for chest pain and palpitations.       Denies recent chest pain or palpitations  Gastrointestinal:        Denies heartburn or reflux symptoms, but reports that she takes medication for GERD  Genitourinary:  Positive for dysuria.  Skin:        Reports she has keratosis pilaris  Neurological:  Negative for headaches.  Endo/Heme/Allergies:  Positive for environmental allergies.    Physical Exam: BP 130/76   Pulse 80   Temp (!) 97.5 F (36.4 C)   Resp 16   Ht 5\' 3"  (1.6 m)   Wt 158 lb 9.6 oz (71.9 kg)   SpO2 95%   BMI 28.09 kg/m    Physical  Exam Constitutional:      Appearance: Normal appearance.  HENT:     Head: Normocephalic and atraumatic.     Comments: Pharynx normal, eyes normal, ears normal, nose: Bilateral lower turbinates mildly edematous and slightly erythematous with no drainage noted    Right Ear: Tympanic membrane, ear canal and external ear normal.     Left Ear: Tympanic membrane, ear canal and external ear normal.     Mouth/Throat:     Mouth: Mucous membranes are moist.     Pharynx: Oropharynx is clear.  Eyes:     Conjunctiva/sclera: Conjunctivae normal.  Cardiovascular:     Rate and Rhythm: Normal rate and regular rhythm.     Heart sounds: Normal heart sounds.  Pulmonary:  Effort: Pulmonary effort is normal.     Breath sounds: Normal breath sounds.     Comments: Lungs clear to auscultation Musculoskeletal:     Cervical back: Neck supple.  Skin:    General: Skin is warm.  Neurological:     Mental Status: She is alert and oriented to person, place, and time.  Psychiatric:        Mood and Affect: Mood normal.        Behavior: Behavior normal.        Thought Content: Thought content normal.        Judgment: Judgment normal.    Diagnostics: FVC 2.26 L, FEV1 1.87 L.  Predicted FVC 2.27 L, predicted FEV1 1.67 L.  Spirometry indicates normal ventilatory function.  Assessment and Plan: 1. Seasonal and perennial allergic rhinitis   2. Wheeze     No orders of the defined types were placed in this encounter.   Patient Instructions  1. Seasonal and perennial allergic rhinitis (trees, weeds, grasses, indoor molds, outdoor molds, dust mites, cat, dog and cockroach) -We will hold off on using antihistamines due to them causing grogginess. We will also hold off on montelukast due to you trying it in the past and stopping for unknown reason - Continue with: fluticasone nasal spray 1-2 sprays each nostril once a day and Astelin (azelastine) 2 sprays per nostril 1-2 times daily as needed. Try to use  azelastine more consistently Continue to follow up with Dr. Lucia Gaskins (ENT)   2. Possible asthma/wheeze - Lung testing looks great today and I am unsure if you have asthma,.   - Daily controller medication(s): none - Prior to physical activity: albuterol 2 puffs 10-15 minutes before physical activity. - Rescue medications: albuterol 4 puffs every 4-6 hours as needed - Asthma control goals: * Full participation in all desired activities (may need albuterol before activity) * Albuterol use two time or less a week on average (not counting use with activity) * Cough interfering with sleep two time or less a month * Oral steroids no more than once a year * No hospitalizations   Please let us know if this treatment plan is not working well for you. Schedule a follow up appointment in 6 weeks with Dr. Ernst Bowler   Return in about 6 weeks (around 09/11/2020), or if symptoms worsen or fail to improve.    Thank you for the opportunity to care for this patient.  Please do not hesitate to contact me with questions.  Althea Charon, FNP [facility] [place] [facility] [place]

## 2020-08-03 ENCOUNTER — Other Ambulatory Visit: Payer: Self-pay | Admitting: Internal Medicine

## 2020-08-06 ENCOUNTER — Other Ambulatory Visit: Payer: Self-pay | Admitting: Internal Medicine

## 2020-08-06 NOTE — Telephone Encounter (Signed)
Patient has request refill on medication "Plavix 75mg ". Patient last refill was 11/20/2019. Patient is due for refill. Patient medication has High Warnings. Medication pend and sent to PCP [name], [name], NP for approval.

## 2020-08-11 ENCOUNTER — Telehealth: Payer: Self-pay | Admitting: Nurse Practitioner

## 2020-08-11 NOTE — Telephone Encounter (Signed)
Requesting a transfer a care to Dr. Celso Amy.   Please advise.

## 2020-08-13 NOTE — Telephone Encounter (Signed)
I am sorry but I am not accepting new patients at this time.  We do have other providers in the office accepting patients.

## 2020-08-14 ENCOUNTER — Ambulatory Visit: Payer: Medicare Other | Admitting: Cardiology

## 2020-08-28 ENCOUNTER — Telehealth: Payer: Self-pay | Admitting: Physician Assistant

## 2020-08-28 NOTE — Telephone Encounter (Signed)
Patient tells me she was seen by her dentist last week and her ENT this week. She wonders if she has COVID and plans to go to the [place] clinic near her to be tested. She complains of a very sore throat. Denies fever. She coughs and tells me this is not a new symptoms because she has a "lot of sinus drainage." She wonders if the Omeprazole 40 mg that she takes ac meals twice daily has become ineffective. She is not experiencing reflux, indigestion or abdominal pain. She does endorse that she may have  a little cough when eating "2 or 3 times a week." She does not regurgitate the food. Sometimes she has a little chest pain when this happens. She had a cardiac work up because of this symptom. Cardiology "ran a whole lot of tests and in the end sent me out with a bottle of nitro-glycerin." Admits she does not take it, but she may try it for the chest pain "next time."  She will go for the COVID test. She will call back if her choking symptoms worsen. Is there anything else she should do at this time?

## 2020-08-28 NOTE — Telephone Encounter (Signed)
Inbound call from patient. Requesting to have a conversation with nurse about problems with her throat. States it has been really difficult to swallow and very sore the last couple days. Also wants to discuss medication. Best contact number [phone]

## 2020-08-29 ENCOUNTER — Other Ambulatory Visit: Payer: Self-pay | Admitting: Student

## 2020-08-29 ENCOUNTER — Telehealth (HOSPITAL_COMMUNITY): Payer: Self-pay | Admitting: *Deleted

## 2020-08-29 ENCOUNTER — Emergency Department (HOSPITAL_COMMUNITY)
Admission: EM | Admit: 2020-08-29 | Discharge: 2020-08-29 | Disposition: A | Discharge status: Home / Self Care | Payer: Medicare Other | Attending: Emergency Medicine | Admitting: Emergency Medicine

## 2020-08-29 ENCOUNTER — Emergency Department (HOSPITAL_COMMUNITY): Payer: Medicare Other

## 2020-08-29 ENCOUNTER — Encounter (HOSPITAL_COMMUNITY): Payer: Self-pay

## 2020-08-29 ENCOUNTER — Other Ambulatory Visit: Payer: Self-pay | Admitting: Cardiology

## 2020-08-29 DIAGNOSIS — Z85828 Personal history of other malignant neoplasm of skin: Secondary | ICD-10-CM | POA: Diagnosis not present

## 2020-08-29 DIAGNOSIS — Z79899 Other long term (current) drug therapy: Secondary | ICD-10-CM | POA: Diagnosis not present

## 2020-08-29 DIAGNOSIS — R0789 Other chest pain: Secondary | ICD-10-CM | POA: Insufficient documentation

## 2020-08-29 DIAGNOSIS — R072 Precordial pain: Secondary | ICD-10-CM

## 2020-08-29 DIAGNOSIS — Z20822 Contact with and (suspected) exposure to covid-19: Secondary | ICD-10-CM | POA: Diagnosis not present

## 2020-08-29 DIAGNOSIS — E039 Hypothyroidism, unspecified: Secondary | ICD-10-CM | POA: Insufficient documentation

## 2020-08-29 LAB — RESP PANEL BY RT-PCR (FLU A&B, COVID) ARPGX2
Influenza A by PCR: NEGATIVE
Influenza B by PCR: NEGATIVE
SARS Coronavirus 2 by RT PCR: NEGATIVE

## 2020-08-29 LAB — BASIC METABOLIC PANEL
Anion gap: 7 (ref 5–15)
BUN: 17 mg/dL (ref 8–23)
CO2: 26 mmol/L (ref 22–32)
Calcium: 9.7 mg/dL (ref 8.9–10.3)
Chloride: 109 mmol/L (ref 98–111)
Creatinine, Ser: 0.74 mg/dL (ref 0.44–1.00)
GFR, Estimated: >60 mL/min (ref >60)
Glucose, Bld: 126 mg/dL — ABNORMAL HIGH (ref 70–99)
Potassium: 3.7 mmol/L (ref 3.5–5.1)
Sodium: 142 mmol/L (ref 135–145)

## 2020-08-29 LAB — CBC
HCT: 41 % (ref 36.0–46.0)
Hemoglobin: 13.2 g/dL (ref 12.0–15.0)
MCH: 29.4 pg (ref 26.0–34.0)
MCHC: 32.2 g/dL (ref 30.0–36.0)
MCV: 91.3 fL (ref 80.0–100.0)
Platelets: 253 10*3/uL (ref 150–400)
RBC: 4.49 MIL/uL (ref 3.87–5.11)
RDW: 13.2 % (ref 11.5–15.5)
WBC: 7.5 10*3/uL (ref 4.0–10.5)
nRBC: 0 % (ref 0.0–0.2)

## 2020-08-29 LAB — TROPONIN I (HIGH SENSITIVITY)
Troponin I (High Sensitivity): 3 ng/L (ref <18)
Troponin I (High Sensitivity): 4 ng/L (ref <18)

## 2020-08-29 IMAGING — CR DG CHEST 2V
2 series · 2 of 2 positions shown · non-contrast
Comparison: Portable chest [DATE].

CLINICAL DATA: 87-year-old female with chest pain that woke her
from sleep at [O2] hours. Pain radiating to the back and left arm.

EXAM:
CHEST - 2 VIEW

[chest pa]
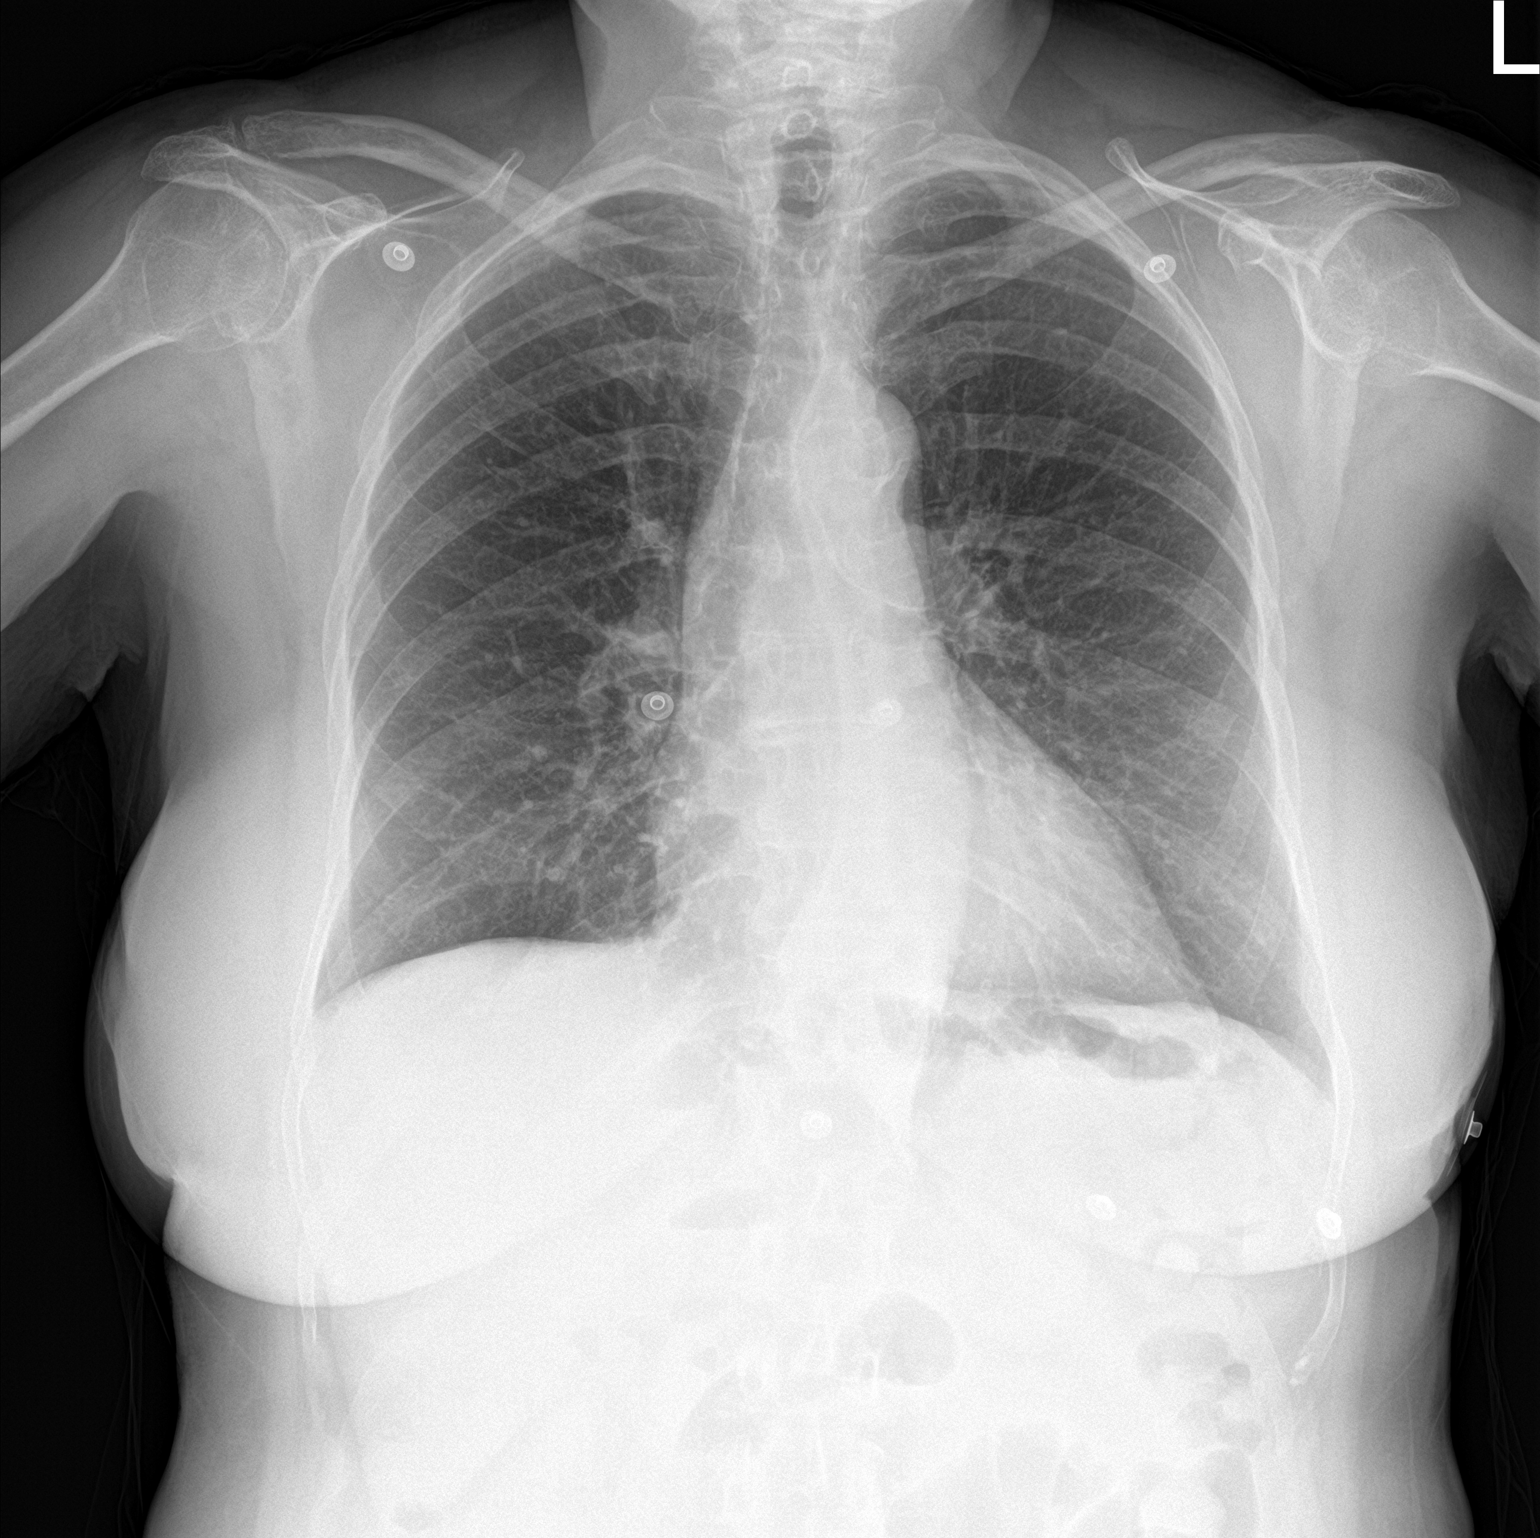

[chest lat]
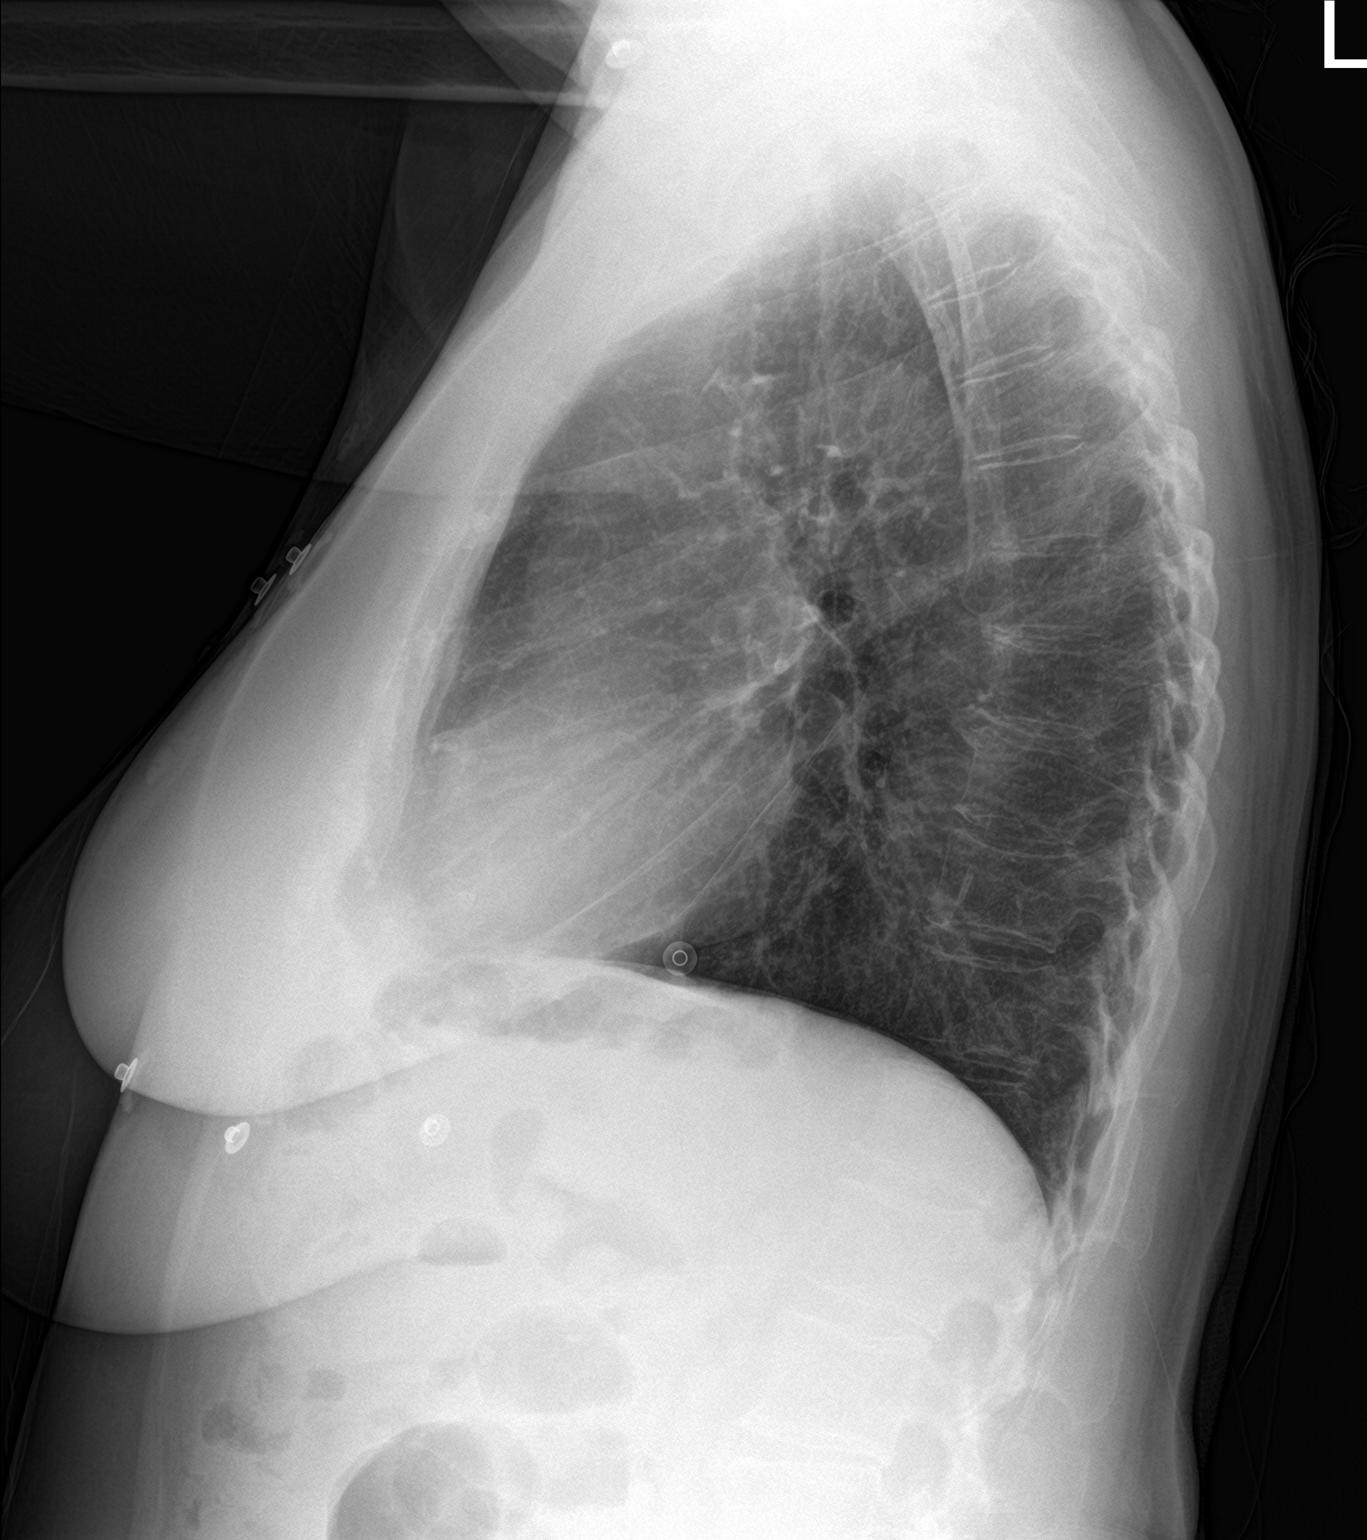

[2 of 2 positions shown; findings below may reference images not displayed]

FINDINGS: PA and lateral views. Lung volumes and mediastinal contours are
within normal limits. Visualized tracheal air column is within
normal limits. Lung markings appear stable since last year and both
lungs appear clear. No pneumothorax, pulmonary edema, pleural
effusion or confluent pulmonary opacity.

Osteopenia. No acute osseous abnormality identified. Negative
visible bowel gas pattern.
IMPRESSION: Negative.  No acute cardiopulmonary abnormality.

## 2020-08-29 MED ORDER — METOPROLOL TARTRATE 50 MG PO TABS: 50.0000 mg | ORAL_TABLET | ORAL | 1 refills | Status: DC

## 2020-08-29 MED ORDER — HYDRALAZINE HCL 20 MG/ML IJ SOLN: 10.0000 mg | Freq: Three times a day (TID) | INTRAMUSCULAR | Status: DC | PRN

## 2020-08-29 MED ORDER — ACETAMINOPHEN 500 MG PO TABS
1000.0000 mg | ORAL_TABLET | Freq: Four times a day (QID) | ORAL | Status: DC | PRN
  Administered 2020-08-29: 1000 mg via ORAL
  Filled 2020-08-29: qty 2

## 2020-08-29 MED ORDER — NITROGLYCERIN 0.4 MG SL SUBL: 0.4000 mg | SUBLINGUAL_TABLET | SUBLINGUAL | Status: DC | PRN

## 2020-08-29 NOTE — Discharge Instructions (Addendum)
You were seen in the ED today with chest pain. Your labs and EKG did not shows signs of a heart attack. I had your Cardiologist see you here in the ED and they will coordinate with you regarding other outpatient tests needed. Return to the ED with any new or suddenly worsening symptoms.

## 2020-08-29 NOTE — Consult Note (Signed)
CARDIOLOGY CONSULT NOTE  Patient ID: Darlene [name] MRN: [id number] DOB/AGE: 06/22/1933 87 y.o.  Admit date: 08/29/2020 Referring Physician: [facility] Reason for Consultation:  Chest pain  HPI:   87 y.o. Caucasian female  with hypertension, hyperlipidemia, h/o stroke (2008), anxiety, depression, presented with chest pain.  Patient sees Dr. Einar Gip for cardiac management at baseline.  Patient has had multiple recurrent episodes of chest pain, with cardiac work-up including recent nuclear stress test in 03/2020 being normal.  Patient lives in friend's home apartment.  She has been very stressed with renovation delays related to her apartment.  Patient gets very emotional and tearful while talking about this while giving history about her chest pain.  She reports that she has had episodes of lower retrosternal/epigastric pain that sometimes wake her from sleep.  Pain sometimes radiates to left arm.  There is no correlation with exertion.  Usually, nitroglycerin does not help this pain.  This morning, pain started at 2: 30 AM.  Nitroglycerin did not help at home.  Nitroglycerin given by EMS only partially relieved the pain.  Work-up in the ER showed EKG with no new acute ischemic changes, high-sensitivity troponin 2 hours apart at 3, 4.  Patient's blood pressure increased to 160/110 mmHg while talking to me about her apartment situation.   Past Medical History:  Diagnosis Date   Anxiety    Colon polyps    Depression    Hyperlipidemia    Hypertension    Hypothyroidism    Melanoma in situ ([facility]) 09/19/2018   Right Arm (excision)   Skin cancer    Sleep apnea    Stroke [facility]) 2008     Past Surgical History:  Procedure Laterality Date   ADENOIDECTOMY     APPENDECTOMY  1960   CATARACT EXTRACTION Left    COLONOSCOPY     GUM SURGERY  03/2019   LUMBAR [place] SURGERY  2014   MELANOMA EXCISION  2020   MESH APPLIED TO LAP PORT     TONSILLECTOMY  1940      Family History  Problem Relation Age  of Onset   Lung cancer Mother 69       smoker   Heart disease Mother    Stroke Father 61   Heart attack Father    Allergic rhinitis Sister    Hypertension Sister    CVA Brother    Lung cancer Maternal Grandfather    Heart attack Paternal Grandfather    Melanoma Daughter    Psoriasis Daughter    Rheum arthritis Daughter    Bipolar disorder Daughter    Colon cancer Neg Hx    Esophageal cancer Neg Hx    Inflammatory bowel disease Neg Hx    Liver disease Neg Hx    Pancreatic cancer Neg Hx    Stomach cancer Neg Hx    Rectal cancer Neg Hx      Social History: Social History   Socioeconomic History   Marital status: Widowed    Spouse name: Not on file   Number of children: 3   Years of education: Not on file   Highest education level: Not on file  Occupational History   Occupation: retired Radiographer, therapeutic  Tobacco Use   Smoking status: Never   Smokeless tobacco: Never  Vaping Use   Vaping Use: Never used  Substance and Sexual Activity   Alcohol use: Yes    Comment: hardly ever   Drug use: Never   Sexual activity: Not  on file  Other Topics Concern   Not on file  Social History Narrative   Social History      Diet? ok      Do you drink/eat things with caffeine? Weak coffee, (much milk)      Marital status?          divorced                          What year were you married? 1961      Do you live in a house, apartment, assisted living, condo, trailer, etc.? apartment      Is it one or more stories? 4      How many persons live in your home? Only me      Do you have any pets in your home? (please list) no      Highest level of education completed? 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             yes                         Type & how often? Walk- would love to return to water exercise      Advanced Directives      Do you have a living will? yes      Do you have a  DNR form?           no                       If not, do you want to discuss one? no      Do you have signed POA/HPOA for forms? no      Functional Status      Do you have difficulty bathing or dressing yourself?  no      Do you have difficulty preparing food or eating? no      Do you have difficulty managing your medications? no      Do you have difficulty managing your finances?  no      Do you have difficulty affording your medications? no      Social Determinants of Health   Financial Resource Strain: Not on file  Food Insecurity: Not on file  Transportation Needs: Not on file  Physical Activity: Not on file  Stress: Not on file  Social Connections: Not on file  Intimate Partner Violence: Not on file     (Not in a hospital admission)   Review of Systems  Constitutional: Negative for decreased appetite, malaise/fatigue, weight gain and weight loss.  HENT:  Negative for congestion.   Eyes:  Negative for visual disturbance.  Cardiovascular:  Positive for chest pain. Negative for dyspnea on exertion, leg swelling, palpitations and syncope.  Respiratory:  Negative for cough.   Endocrine: Negative for cold intolerance.  Hematologic/Lymphatic: Does not bruise/bleed easily.  Skin:  Negative for itching and rash.  Musculoskeletal:  Negative for myalgias.  Gastrointestinal:  Negative for abdominal pain, nausea and vomiting.  Genitourinary:  Negative for dysuria.  Neurological:  Negative for dizziness and weakness.  Psychiatric/Behavioral:  The patient is nervous/anxious.   All other systems reviewed and are negative.    Physical Exam: Physical Exam Vitals and nursing note reviewed.  Constitutional:      General: She is not  in acute distress.    Appearance: She is well-developed.  HENT:     Head: Normocephalic and atraumatic.  Eyes:     Conjunctiva/sclera: Conjunctivae normal.     Pupils: Pupils are equal, round, and reactive to light.  Neck:     Vascular: No JVD.   Cardiovascular:     Rate and Rhythm: Normal rate and regular rhythm.     Pulses: Normal pulses and intact distal pulses.     Heart sounds: No murmur heard. Pulmonary:     Effort: Pulmonary effort is normal.     Breath sounds: Normal breath sounds. No wheezing or rales.  Abdominal:     General: Bowel sounds are normal.     Palpations: Abdomen is soft.     Tenderness: There is no rebound.  Musculoskeletal:        General: No tenderness. Normal range of motion.     Right lower leg: No edema.     Left lower leg: No edema.  Lymphadenopathy:     Cervical: No cervical adenopathy.  Skin:    General: Skin is warm and dry.  Neurological:     Mental Status: She is alert and oriented to person, place, and time.     Cranial Nerves: No cranial nerve deficit.  Psychiatric:        Mood and Affect: Mood is anxious.     Labs:   Lab Results  Component Value Date   WBC 7.5 08/29/2020   HGB 13.2 08/29/2020   HCT 41.0 08/29/2020   MCV 91.3 08/29/2020   PLT 253 08/29/2020    Recent Labs  Lab 08/29/20 0427  NA 142  K 3.7  CL 109  CO2 26  BUN 17  CREATININE 0.74  CALCIUM 9.7  GLUCOSE 126*    Lipid Panel     Component Value Date/Time   CHOL 180 12/26/2019 1053   TRIG 123 12/26/2019 1053   HDL 51 12/26/2019 1053   CHOLHDL 3.5 12/26/2019 1053   LDLCALC 107 (H) 12/26/2019 1053    BNP (last 3 results) No results for input(s): BNP in the last 8760 hours.  HEMOGLOBIN A1C Lab Results  Component Value Date   HGBA1C 6.2 (H) 01/08/2020   MPG 131 01/08/2020    Cardiac Panel (last 3 results) No results for input(s): CKTOTAL, CKMB, RELINDX in the last 8760 hours.  Invalid input(s): TROPONINHS  No results found for: CKTOTAL, CKMB, CKMBINDEX   TSH No results for input(s): TSH in the last 8760 hours.    Radiology: DG Chest 2 View  Result Date: 08/29/2020 CLINICAL DATA:  87 year old female with chest pain that woke her from sleep at 0230 hours. Pain radiating to the back  and left arm. EXAM: CHEST - 2 VIEW COMPARISON:  Portable chest 02/20/2019. FINDINGS: PA and lateral views. Lung volumes and mediastinal contours are within normal limits. Visualized tracheal air column is within normal limits. Lung markings appear stable since last year and both lungs appear clear. No pneumothorax, pulmonary edema, pleural effusion or confluent pulmonary opacity. Osteopenia. No acute osseous abnormality identified. Negative visible bowel gas pattern. IMPRESSION: Negative.  No acute cardiopulmonary abnormality. Electronically Signed   By: Genevie Ann M.D.   On: 08/29/2020 05:00    Scheduled Meds: Continuous Infusions: PRN Meds:.acetaminophen, hydrALAZINE, nitroGLYCERIN  CARDIAC STUDIES:  EKG 08/29/2020: Sinus rhythm Nonspecific anteroseptal ST-T changes, unchanged from previous EKG in 01/2020  Stress Echocardiogram 06/06/2017:  Resting EKG demonstrated normal sinus rhythm. Stress EKG is negative for myocardial  ischemia, patient exercised for 4:29 minutes, achieved 6.2 Mets. Normal blood pressure response. No evidence of ischemia. Stress echocardiogram: Normal LV systolic function, no stress induced wall motion abnormality. EF 75-80%.   Nocturnal oximetry 04/18/2019: SPO2 88% for 6 minutes SPO2 less than 89% 11 minutes. Oxygen desaturation events 03/26/1940.  Desaturation index 42.   Minimum heart rate 54, maximum heart rate 89 bpm, average heart rate 60 bpm.  Percentage in bradycardia 66%.   Highest SPO2 99%, lowest SPO2 84%. Impression: Patient qualifies for nocturnal oxygen supplementation per Medicare guidelines Group 1.   Remote outpatient cardiac telemetry 04/24/2019 through 05/08/2019: Predominant rhythm is normal sinus rhythm.  Minimum heart rate 49, average heart rate 72 and maximum heart rate 195 bpm. PVC burden <1%.  There were 0 ventricular couplets and 0 ventricular runs. PAC burden <1%.  There was 1 supraventricular run of 5 beats.  No atrial fibrillation.  No heart  block. 12 patient activated events correlated with sinus rhythm.   PCV MYOCARDIAL PERFUSION WO LEXISCAN 04/14/2020 Normal ECG stress. The patient exercised for 5 minutes and 0 seconds of a Bruce protocol, achieving approximately 7.03 METs.  Normal BP response. Reduced exercise tolerance. Non-limiting chest pain with exercise. Myocardial perfusion is normal. Overall LV systolic function is normal without regional wall motion abnormalities. Stress LV EF: 63%. No previous exam available for comparison. Low risk.   PCV ECHOCARDIOGRAM COMPLETE [id number] Normal LV systolic function with visual EF 60-65%. Left ventricle cavity is normal in size. Normal global wall motion. Normal diastolic filling pattern, normal LAP. Mild tricuspid regurgitation. Compared to prior study dated 04/24/2019: no significant change.    Assessment & Recommendations:  87 y.o. Caucasian female  with hypertension, hyperlipidemia, h/o stroke (2008), anxiety, depression, presented with chest pain.  Chest pain: Somewhat typical, but only partial improvement with NTG. EKG with no new changes to suggest acute ischemia. HS troop flat and low at 3,4. Overall, my suspicion for ACS is very low. I do think stress related to her apartment situation is playing a significant role in her symptoms. GERD is a differential, for which she is being treated for. Suspicion for PE is very low.   She has had a negative exercise nuclear stress test in 03/2020. Given her persistent symptoms, coronary anatomy visualization will be definitive to rule out any obstructive CAD, and any possibility of false negative stress test. I discussed risks/benefits of invasive coronary angiography vs coronary CT angiogram. She prefers the latter. Benefits or invasive coronary angiography do not clearly outweigh the risks in this 87 y/o with prior stroke and reassuring acute as well as prior ischemia workup. I think close coronary CTA with follow up is perfectly  reasonable. I will arrange this.  Hypertension: Elevated BP while the patient was talking to me about her apartment situation. She is very emotional and tearful talking about this. Will give hydralazine IV prn while in ER. After that, she cam safely be discharged form ER.   Unfortunately, regardless of invasive or non-invasive coronary angiogram workup, Her primary stressor of going back to her apartment remains. I have encouraged her to speak with her brother- who lives in town, to see if he could help.    Nigel Mormon, MD Pager: [phone] Office: [phone]

## 2020-08-29 NOTE — ED Provider Notes (Signed)
[facility] EMERGENCY DEPARTMENT Provider Note   CSN: [id number] Arrival date & time: 08/29/20  0423     History Chief Complaint  Patient presents with   Chest Pain    Doreena Lory is a 87 y.o. female.  Patient is a an 87 year old female with past medical history of hypertension, hyperlipidemia, prior CVA.  Patient sent from her independent living facility for evaluation of chest discomfort.  This apparently woke her up at approximately 230 this morning.  She describes a heaviness in her chest that radiates down her left arm.  Patient was given nitroglycerin by EMS with some improvement.  She denies any l cough or fever.   The history is provided by the patient.  Chest Pain Pain location:  Substernal area Pain quality comment:  Heaviness Pain radiates to:  L arm Pain severity:  Moderate Onset quality:  Sudden Duration:  3 hours Timing:  Constant Progression:  Partially resolved Chronicity:  New Relieved by:  Nitroglycerin Worsened by:  Nothing Ineffective treatments:  None tried     Past Medical History:  Diagnosis Date   Anxiety    Colon polyps    Depression    Hyperlipidemia    Hypertension    Hypothyroidism    Melanoma in situ ([place]) 09/19/2018   Right Arm (excision)   Skin cancer    Sleep apnea    Stroke [facility]) 2008    Patient Active Problem List   Diagnosis Date Noted   Other spondylosis with radiculopathy, cervical region 02/19/2020   Prediabetes 01/09/2020   Adverse reaction to COVID-19 vaccine 11/29/2019   Chronic cough 11/24/2019   Melanoma of skin ([place]) 08/24/2019   Palpitations 06/07/2019   Schatzki's ring 05/12/2019   Hiatal hernia 05/12/2019   Insomnia 03/29/2019   Back pain with left-sided sciatica 03/01/2019   Daytime sleepiness 11/30/2018   History of colonic polyps 09/03/2018   Dysphagia 09/03/2018   Pyrosis 09/03/2018   Chronic sore throat 09/03/2018   Actinic keratoses 02/23/2018   Seasonal and perennial allergic  rhinitis 01/31/2018   Incontinent of urine 12/01/2017   Essential hypertension 09/06/2017   History of CVA (cerebrovascular accident) 09/06/2017   Hyperlipidemia 09/06/2017   GAD (generalized anxiety disorder) 09/06/2017   Acquired hypothyroidism 09/06/2017   Chronic sinusitis 09/06/2017   Gastroesophageal reflux disease 09/06/2017    Past Surgical History:  Procedure Laterality Date   ADENOIDECTOMY     APPENDECTOMY  1960   CATARACT EXTRACTION Left    COLONOSCOPY     GUM SURGERY  03/2019   LUMBAR [place] SURGERY  2014   MELANOMA EXCISION  2020   MESH APPLIED TO LAP PORT     TONSILLECTOMY  1940     OB History   No obstetric history on file.     Family History  Problem Relation Age of Onset   Lung cancer Mother 62       smoker   Heart disease Mother    Stroke Father 63   Heart attack Father    Allergic rhinitis Sister    Hypertension Sister    CVA Brother    Lung cancer Maternal Grandfather    Heart attack Paternal Grandfather    Melanoma Daughter    Psoriasis Daughter    Rheum arthritis Daughter    Bipolar disorder Daughter    Colon cancer Neg Hx    Esophageal cancer Neg Hx    Inflammatory bowel disease Neg Hx    Liver disease Neg Hx  Pancreatic cancer Neg Hx    Stomach cancer Neg Hx    Rectal cancer Neg Hx     Social History   Tobacco Use   Smoking status: Never   Smokeless tobacco: Never  Vaping Use   Vaping Use: Never used  Substance Use Topics   Alcohol use: Yes    Comment: hardly ever   Drug use: Never    Home Medications Prior to Admission medications   Medication Sig Start Date End Date Taking? Authorizing Provider  acetaminophen (TYLENOL) 325 MG tablet Take 325-650 mg by mouth every 6 (six) hours as needed for moderate pain (for sinus headache).    [provider]  albuterol (VENTOLIN HFA) 108 (90 Base) MCG/ACT inhaler Inhale 2 puffs into the lungs every 6 (six) hours as needed for wheezing or shortness of breath. 07/31/20   Althea Charon, FNP  azelastine (ASTELIN) 0.1 % nasal spray Place 1 to 2 sprays each nostril 1-2 times a day as needed for runny nose/drainage down throat 07/31/20   Althea Charon, FNP  clopidogrel (PLAVIX) 75 MG tablet TAKE 1 TABLET BY MOUTH EVERY DAY 08/06/20   [name], [name], NP  diazepam (VALIUM) 5 MG tablet Take 0.5 tablets (2.5 mg total) by mouth as needed (anxiety). 11/26/19   Orma Flaming, MD  ezetimibe (ZETIA) 10 MG tablet Take 1 tablet (10 mg total) by mouth daily. 05/16/20 09/13/20  Cantwell, Celeste C, PA-C  fluticasone (FLONASE) 50 MCG/ACT nasal spray Place 1-2 sprays into both nostrils daily. 07/31/20   Althea Charon, FNP  GEMTESA 75 MG TABS Take 1 tablet by mouth daily. Patient not taking: Reported on 07/31/2020 07/18/20   [provider]  ketoconazole (NIZORAL) 2 % shampoo Apply topically. 01/01/20   [provider]  losartan (COZAAR) 50 MG tablet Take 1 tablet (50 mg total) by mouth daily. 05/28/20   [name], [name], NP  MYRBETRIQ 50 MG TB24 tablet Take 50 mg by mouth daily. Patient not taking: Reported on 07/31/2020 07/14/20   [provider]  nitroGLYCERIN (NITROSTAT) 0.4 MG SL tablet Place 1 tablet (0.4 mg total) under the tongue every 5 (five) minutes as needed for up to 25 days for chest pain. 04/04/20 04/29/20  Adrian Prows, MD  omeprazole (PRILOSEC) 40 MG capsule TAKE 1 CAPSULE (40 MG TOTAL) BY MOUTH 2 (TWO) TIMES DAILY BEFORE A MEAL. 06/24/20   Mansouraty, Telford Nab., MD  oxybutynin (DITROPAN-XL) 10 MG 24 hr tablet Take 10 mg by mouth daily. Patient not taking: Reported on 07/31/2020 03/27/20   [provider]  pravastatin (PRAVACHOL) 40 MG tablet TAKE 1 TABLET BY MOUTH EVERYDAY AT BEDTIME 06/26/20   Cantwell, Celeste C, PA-C  sodium chloride (OCEAN) 0.65 % SOLN nasal spray Place 1 spray into both nostrils as needed for congestion.    [provider]  SYNTHROID 100 MCG tablet TAKE 1 TABLET BY MOUTH EVERY DAY BEFORE BREAKFAST 04/21/20   Virgie Dad, MD   Zolpidem Tartrate 3.5 MG SUBL Take 1 tablet by mouth at bedtime as needed. Dx K21.9 07/28/20   [name], [name], NP    Allergies    Norco [hydrocodone-acetaminophen], Dust mite extract, Oxycontin [oxycodone hcl], and Pollen extract  Review of Systems   Review of Systems  Cardiovascular:  Positive for chest pain.  All other systems reviewed and are negative.  Physical Exam Updated Vital Signs BP 117/72 (BP Location: Right Arm)   Pulse 64   Temp 97.8 F (36.6 C) (Oral)   Resp  18   SpO2 96%   Physical Exam Vitals and nursing note reviewed.  Constitutional:      General: She is not in acute distress.    Appearance: She is well-developed. She is not diaphoretic.  HENT:     Head: Normocephalic and atraumatic.  Cardiovascular:     Rate and Rhythm: Normal rate and regular rhythm.     Heart sounds: No murmur heard.   No friction rub. No gallop.  Pulmonary:     Effort: Pulmonary effort is normal. No respiratory distress.     Breath sounds: Normal breath sounds. No wheezing.  Abdominal:     General: Bowel sounds are normal. There is no distension.     Palpations: Abdomen is soft.     Tenderness: There is no abdominal tenderness.  Musculoskeletal:        General: Normal range of motion.     Cervical back: Normal range of motion and neck supple.     Right lower leg: No tenderness. No edema.     Left lower leg: No tenderness. No edema.  Skin:    General: Skin is warm and dry.  Neurological:     General: No focal deficit present.     Mental Status: She is alert and oriented to person, place, and time.    ED Results / Procedures / Treatments   Labs (all labs ordered are listed, but only abnormal results are displayed) Labs Reviewed  BASIC METABOLIC PANEL - Abnormal; Notable for the following components:      Result Value   Glucose, Bld 126 (*)    All other components within normal limits  RESP PANEL BY RT-PCR (FLU A&B, COVID) ARPGX2  CBC  TROPONIN I (HIGH SENSITIVITY)     EKG None  Radiology DG Chest 2 View  Result Date: 08/29/2020 CLINICAL DATA:  87 year old female with chest pain that woke her from sleep at 0230 hours. Pain radiating to the back and left arm. EXAM: CHEST - 2 VIEW COMPARISON:  Portable chest 02/20/2019. FINDINGS: PA and lateral views. Lung volumes and mediastinal contours are within normal limits. Visualized tracheal air column is within normal limits. Lung markings appear stable since last year and both lungs appear clear. No pneumothorax, pulmonary edema, pleural effusion or confluent pulmonary opacity. Osteopenia. No acute osseous abnormality identified. Negative visible bowel gas pattern. IMPRESSION: Negative.  No acute cardiopulmonary abnormality. Electronically Signed   By: Genevie Ann M.D.   On: 08/29/2020 05:00    Procedures Procedures   Medications Ordered in ED Medications - No data to display  ED Course  I have reviewed the triage vital signs and the nursing notes.  Pertinent labs & imaging results that were available during my care of the patient were reviewed by me and considered in my medical decision making (see chart for details).    MDM Rules/Calculators/A&P  Patient presenting with complaints of chest discomfort as described in the HPI.  Thus far, her work-up is unremarkable.  Initial troponin is negative and EKG is unchanged.  Patient has been given nitroglycerin with some relief.  Care signed out to oncoming provider at shift change awaiting results of second troponin.  Disposition pending.  Final Clinical Impression(s) / ED Diagnoses Final diagnoses:  None    Rx / DC Orders ED Discharge Orders     None        Veryl Speak, MD 08/29/20 2336

## 2020-08-29 NOTE — ED Provider Notes (Signed)
Blood pressure (!) 141/71, pulse (!) 57, temperature 97.8 F (36.6 C), temperature source Oral, resp. rate 18, SpO2 99 %.  Assuming care from Dr. Stark Jock.  In short, Darlene [name] is a 87 y.o. female with a chief complaint of Chest Pain .  Refer to the original H&P for additional details.  The current plan of care is to follow up on repeat troponin.  In chart review the patient is followed by [facility] cardiology.  She was seen there last in March with chest discomfort at that time.  She had a normal Myoview stress and symptoms seem to improve with Imdur.    EKG Interpretation  Date/Time:  Friday August 29 2020 04:28:21 EDT Ventricular Rate:  64 PR Interval:  180 QRS Duration: 68 QT Interval:  418 QTC Calculation: 431 R Axis:   31 Text Interpretation: Sinus rhythm with Premature atrial complexes Low voltage QRS Cannot rule out Anterior infarct , age undetermined Abnormal ECG Similar to Jan 2022 tracing Confirmed by Nanda Quinton [phone]) on 08/29/2020 8:12:00 AM       08:40 AM  Spoke with [facility]. They will come and see the patient int he ED.   10:16 AM  Appreciate Cardiology consultation. They will arrange outpatient coronary CT. Plan for discharge.    Margette Fast, MD 08/29/20 1115

## 2020-08-29 NOTE — Telephone Encounter (Signed)
Attempted to call patient regarding upcoming cardiac CT appointment. °Left message on voicemail with name and callback number ° °Merin Borjon RN Navigator Cardiac Imaging °[facility] °[phone] Office °[phone] Cell ° °

## 2020-08-29 NOTE — ED Provider Notes (Signed)
Emergency Medicine Provider Triage Evaluation Note  Darlene [name] , a 87 y.o. female  was evaluated in triage.  Pt complains of chest pain that awakened her tonight at 2:30 in the morning.  She reports pain in her back and radiating down her left arm.  She denies shortness of breath.  She was given 2 doses of nitroglycerin from EMS with some improvement.  She cannot take aspirin.  She comes from [facility].  Denies any history of heart attack, but has had prior stroke.  Review of Systems  Positive: Chest pain, back pain Negative: Fever, chills  Physical Exam  BP 117/72 (BP Location: Right Arm)   Pulse 64   Temp 97.8 F (36.6 C) (Oral)   Resp 18   SpO2 96%  Gen:   Awake, no distress   Resp:  Normal effort  MSK:   Moves extremities without difficulty  Other:    Medical Decision Making  Medically screening exam initiated at 4:24 AM.  Appropriate orders placed.  Darlene [name] was informed that the remainder of the evaluation will be completed by another provider, this initial triage assessment does not replace that evaluation, and the importance of remaining in the ED until their evaluation is complete.  Chest pain   Montine Circle, PA-C 08/29/20 [id number]    Veryl Speak, MD 08/29/20 [phone]

## 2020-08-29 NOTE — ED Triage Notes (Signed)
Pt comes via [facility] for CP that woke her from her sleep at 230am, pain radiates to L arm and back, PTA received 2 nitro

## 2020-09-01 ENCOUNTER — Other Ambulatory Visit: Payer: Self-pay

## 2020-09-01 ENCOUNTER — Ambulatory Visit (HOSPITAL_COMMUNITY)
Admission: RE | Admit: 2020-09-01 | Discharge: 2020-09-01 | Disposition: A | Discharge status: Home / Self Care | Payer: Medicare Other | Source: Ambulatory Visit | Attending: Cardiology | Admitting: Cardiology

## 2020-09-01 ENCOUNTER — Encounter (HOSPITAL_COMMUNITY): Payer: Self-pay

## 2020-09-01 ENCOUNTER — Telehealth: Payer: Self-pay

## 2020-09-01 DIAGNOSIS — R072 Precordial pain: Secondary | ICD-10-CM | POA: Diagnosis not present

## 2020-09-01 IMAGING — CT CT HEART MORP W/ CTA COR W/ SCORE W/ CA W/CM &/OR W/O CM
1 of 9 series · 3 of 20 positions shown, 4 images · IV contrast (APPLIED)
Comparison: Chest radiograph [DATE]. No prior CT.
COMPARISON: Chest radiograph [DATE]. No prior CT.

Addendum:
EXAM:
OVER-READ INTERPRETATION  CT CHEST

The following report is an over-read performed by radiologist Dr.
NEJ [REDACTED] on [DATE]. This over-read
does not include interpretation of cardiac or coronary anatomy or
pathology. The coronary CTA interpretation by the cardiologist is
attached.
HISTORY: Chest pain/anginal equiv, ECGs and troponins normal
Cardiac/Coronary  CT
TECHNIQUE: The patient was scanned on a Siemens Force scanner.
PROTOCOL: A 120 kV prospective scan was triggered in the descending thoracic
aorta at 111 HU's. Axial non-contrast 3 mm slices were carried out
through the heart. The data set was analyzed on a dedicated work
station and scored using the Agatson method. Gantry rotation speed
was 250 msecs and collimation was .6 mm. No IV beta blockade but
mg of sl NTG was given. The 3D data set was reconstructed in 5%
intervals of the 67-82 % of the R-R cycle. Diastolic phases were
analyzed on a dedicated work station using MPR, MIP and VRT modes.
The patient received 95mL OMNIPAQUE IOHEXOL 350 MG/ML SOLN, 80mL
OMNIPAQUE IOHEXOL 350 MG/ML SOLN of contrast.

[Series 7: best syst · axial · 0.39mm/px · z∈[-206,-107]mm · 3 of 248 slices shown, 4 images]
[im 1/248  vessel]
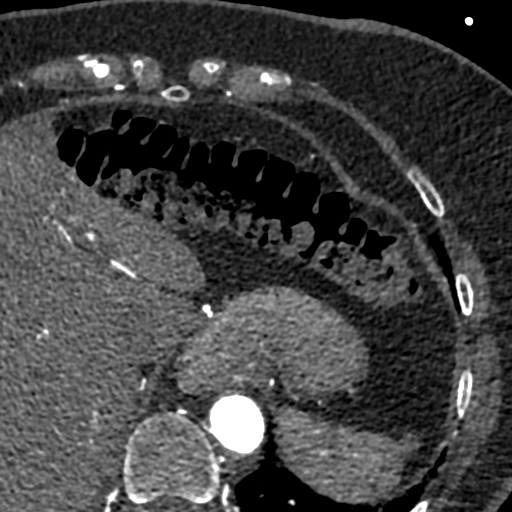
[im 1/248  lung]
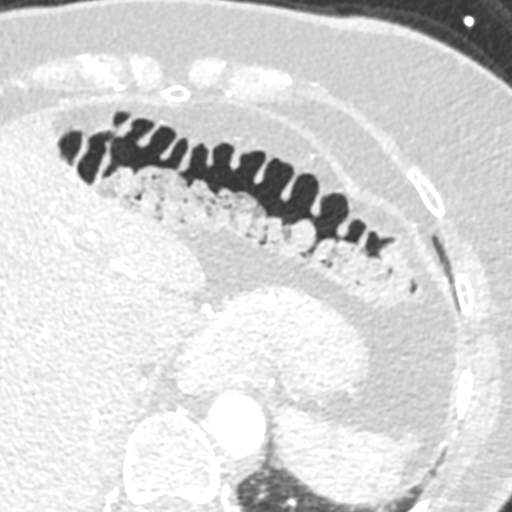
[im 124/248  vessel]
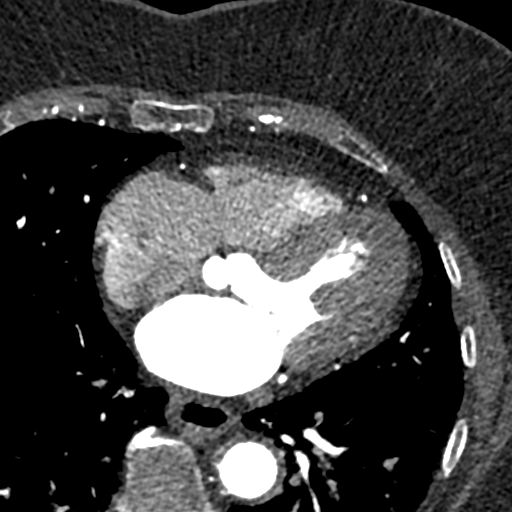
[im 248/248  vessel]
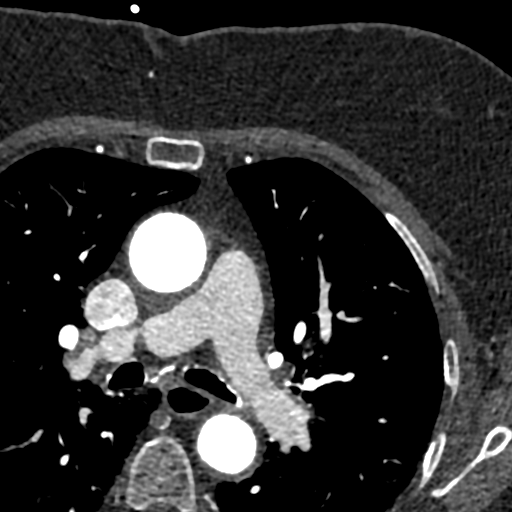

[3 of 20 positions shown; findings below may reference images not displayed]

FINDINGS: Vascular: Aortic atherosclerosis. No central pulmonary embolism, on
this non-dedicated study.

Mediastinum/Nodes: No imaged thoracic adenopathy. Small to moderate
hiatal hernia.

Lungs/Pleura: No pleural fluid.  Clear imaged lungs.

Upper Abdomen: Normal imaged portions of the liver, spleen.

Musculoskeletal: No acute osseous abnormality.
IMPRESSION: 1. No acute findings in the imaged extracardiac chest.
2. Small to moderate hiatal hernia.
3. Aortic Atherosclerosis ([JZ]-[JZ]).
FINDINGS: Image quality: Excellent.

Artifact: Limited.

Total coronary calcium score of 0 NEJ.

Coronary arteries: Normal coronary origins.  Left dominance.

Left Main Coronary Artery: The left main is a normal caliber vessel
with a normal take off from the left coronary cusp that bifurcates
to form a left anterior descending artery and a left circumflex
artery. There is no plaque or stenosis.

Left Anterior Descending Coronary Artery: Normal caliber vessel,
wraps the apex, gives off 1 patent diagonal branches. The LAD and
first diagonal branch is patent without evidence of plaque or
stenosis.

Left Circumflex Artery: Normal caliber vessel, dominant, travels
within the atrioventricular groove, gives off 4 patent obtuse
marginal branches, and terminates as a PDA and posterolateral
branch. The LCX and obtuse marginal branches are patent with no
evidence of plaque or stenosis.

Right Coronary Artery: The RCA is non-dominant with normal take off
from the right coronary cusp without evidence of plaque or stenosis.

Left Atrium: Grossly normal in size with no left atrial appendage
filling defect.

Left Ventricle: Grossly normal in size. There are no stigmata of
prior infarction. There is no abnormal filling defect.

Pulmonary arteries: Normal in size without proximal filling defect.

Pulmonary veins: Normal pulmonary venous drainage.

Aorta: Normal size, 30mm mm at the mid ascending aorta (level of the
right PA ) measured double oblique. Aortic atherosclerosis. No
dissection.

Pericardium: Normal thickness with no significant effusion or
calcium present.

Cardiac valves: The aortic valve is trileaflet without
calcification. The mitral valve is normal structure with mitral
annular calcification.

Extra-cardiac findings: See attached radiology report for
non-cardiac structures.
IMPRESSION: 1. Total coronary calcium score of 0 NEJ.

2. Normal coronary origin with left dominance.

3. Aortic atherosclerosis.

4. CAD-RADS 0: No evidence of epicardial coronary artery disease
(0%).

RECOMMENDATIONS:

1. Consider non-atherosclerotic causes of chest pain.

*** End of Addendum ***
EXAM:
OVER-READ INTERPRETATION  CT CHEST

The following report is an over-read performed by radiologist Dr.
NEJ [REDACTED] on [DATE]. This over-read
does not include interpretation of cardiac or coronary anatomy or
pathology. The coronary CTA interpretation by the cardiologist is
attached.
FINDINGS: Vascular: Aortic atherosclerosis. No central pulmonary embolism, on
this non-dedicated study.

Mediastinum/Nodes: No imaged thoracic adenopathy. Small to moderate
hiatal hernia.

Lungs/Pleura: No pleural fluid.  Clear imaged lungs.

Upper Abdomen: Normal imaged portions of the liver, spleen.

Musculoskeletal: No acute osseous abnormality.
IMPRESSION: 1. No acute findings in the imaged extracardiac chest.
2. Small to moderate hiatal hernia.
3. Aortic Atherosclerosis ([JZ]-[JZ]).

## 2020-09-01 MED ORDER — NITROGLYCERIN 0.4 MG SL SUBL
SUBLINGUAL_TABLET | SUBLINGUAL | Status: AC
  Administered 2020-09-01: 0.8 mg via SUBLINGUAL
  Filled 2020-09-01: qty 2

## 2020-09-01 MED ORDER — IOHEXOL 350 MG/ML SOLN
95.0000 mL | Freq: Once | INTRAVENOUS | Status: AC | PRN
  Administered 2020-09-01: 95 mL via INTRAVENOUS

## 2020-09-01 MED ORDER — NITROGLYCERIN 0.4 MG SL SUBL: 0.8000 mg | SUBLINGUAL_TABLET | Freq: Once | SUBLINGUAL | Status: AC

## 2020-09-01 MED ORDER — IOHEXOL 350 MG/ML SOLN
80.0000 mL | Freq: Once | INTRAVENOUS | Status: AC | PRN
  Administered 2020-09-01: 80 mL via INTRAVENOUS

## 2020-09-01 NOTE — Telephone Encounter (Signed)
Darlene [name] patient, seen by me in hospital on call.  CTA results are pending. Recommend checking back later this week. Dr. Terri Skains will read. Please schedule f/u w/Darlene [name] or CC in the coming week or so to discuss the results and f/u on chest pain.  Thanks MJP

## 2020-09-01 NOTE — Telephone Encounter (Signed)
Patient called requesting her CT results. Please advise.

## 2020-09-01 NOTE — Progress Notes (Signed)
CT scan completed. Tolerated well. D/C Home in wheelchair, awake and alert. In no distress

## 2020-09-02 NOTE — Telephone Encounter (Signed)
Per Dr. Virgina Jock can you schedule pt foe a f/u within a week from now please

## 2020-09-03 DIAGNOSIS — R072 Precordial pain: Secondary | ICD-10-CM | POA: Insufficient documentation

## 2020-09-04 ENCOUNTER — Encounter: Payer: Self-pay | Admitting: Student

## 2020-09-04 ENCOUNTER — Other Ambulatory Visit: Payer: Self-pay

## 2020-09-04 ENCOUNTER — Ambulatory Visit: Payer: Medicare Other | Admitting: Student

## 2020-09-04 VITALS — BP 141/76 | HR 73 | Temp 98.0°F | Resp 17 | Ht 63.0 in | Wt 154.0 lb

## 2020-09-04 DIAGNOSIS — I10 Essential (primary) hypertension: Secondary | ICD-10-CM

## 2020-09-04 DIAGNOSIS — R072 Precordial pain: Secondary | ICD-10-CM

## 2020-09-04 DIAGNOSIS — E7800 Pure hypercholesterolemia, unspecified: Secondary | ICD-10-CM

## 2020-09-04 MED ORDER — PRAVASTATIN SODIUM 40 MG PO TABS: 40.0000 mg | ORAL_TABLET | Freq: Every day | ORAL | 2 refills | Status: DC

## 2020-09-04 NOTE — Progress Notes (Signed)
Primary Physician/Referring:  [name], [name], NP  Patient ID: Darlene [name], female    DOB: 14-Jun-1933, 87 y.o.   MRN: [id number]  Chief Complaint  Patient presents with   Follow-up   ct scan   HPI:    Darlene [name]  is a 87 y.o. Caucasian female with hypertension, hyperlipidemia, prediabetes, h/o sleep apnea not on CPAP, prior history of stroke in 2006 without residual defect, on Plavix chronically. She has had frequent palpitations, difficulty in controlling hypertension, extreme anxiety, multiple medication intolerances.  However with the present combination of medications, blood pressure has been well controlled.   Patient was evaluated in the ED 08/29/2020 with complaints of chest pressure as well as left arm pain.  At that time EKG was without acute ischemic changes, chest x-ray unremarkable, and troponins negative.  Patient was given the option to proceed with coronary CTA versus cardiac catheterization, she opted for coronary CTA now presents to the office to review results.  Notably she remains under significant stress due to her current living situation which is likely contributing to her symptoms.  Patient reports she is feeling high levels of stress presently during today's visit.  Past Medical History:  Diagnosis Date   Anxiety    Colon polyps    Depression    Hyperlipidemia    Hypertension    Hypothyroidism    Melanoma in situ ([place]) 09/19/2018   Right Arm (excision)   Skin cancer    Sleep apnea    Stroke [facility] 2008   Past Surgical History:  Procedure Laterality Date   ADENOIDECTOMY     APPENDECTOMY  1960   CATARACT EXTRACTION Left    COLONOSCOPY     GUM SURGERY  03/2019   LUMBAR [place] SURGERY  2014   MELANOMA EXCISION  2020   MESH APPLIED TO LAP PORT     TONSILLECTOMY  1940   Family History  Problem Relation Age of Onset   Lung cancer Mother 28       smoker   Heart disease Mother    Stroke Father 78   Heart attack Father    Allergic rhinitis Sister     Hypertension Sister    CVA Brother    Lung cancer Maternal Grandfather    Heart attack Paternal Grandfather    Melanoma Daughter    Psoriasis Daughter    Rheum arthritis Daughter    Bipolar disorder Daughter    Colon cancer Neg Hx    Esophageal cancer Neg Hx    Inflammatory bowel disease Neg Hx    Liver disease Neg Hx    Pancreatic cancer Neg Hx    Stomach cancer Neg Hx    Rectal cancer Neg Hx     Social History   Tobacco Use   Smoking status: Never   Smokeless tobacco: Never  Substance Use Topics   Alcohol use: Yes    Comment: hardly ever   ROS   Review of Systems  Cardiovascular:  Positive for chest pain (pressure). Negative for claudication, leg swelling, near-syncope, orthopnea, palpitations, paroxysmal nocturnal dyspnea and syncope.  Respiratory:  Negative for shortness of breath.   Musculoskeletal:  Positive for arthritis and joint pain.       Left arm and back discomfort  Objective  Blood pressure (!) 141/76, pulse 73, temperature 98 F (36.7 C), temperature source Temporal, resp. rate 17, height '5\' 3"'$  (1.6 m), weight 154 lb (69.9 kg), SpO2 97 %.  Vitals with BMI 09/04/2020 09/04/2020 09/01/2020  Height -  $'5\' 3"'e$  -  Weight - 154 lbs -  BMI - 123456 -  Systolic [id number]  Diastolic 76 80 53  Pulse 73 82 55  Physical Exam Vitals reviewed.  Constitutional:      Appearance: Normal appearance.  Cardiovascular:     Rate and Rhythm: Normal rate and regular rhythm.     Pulses: Normal pulses and intact distal pulses.          Radial pulses are 2+ on the right side and 2+ on the left side.     Heart sounds: Normal heart sounds, S1 normal and S2 normal. No murmur heard.   No gallop.     Comments: No JVD. Pulmonary:     Effort: Pulmonary effort is normal. No respiratory distress.     Breath sounds: Normal breath sounds. No wheezing, rhonchi or rales.  Musculoskeletal:        General: Normal range of motion.     Right lower leg: No edema.     Left lower leg: No edema.     Laboratory examination:   CMP Latest Ref Rng & Units 08/29/2020 02/07/2020 12/26/2019  Glucose 70 - 99 mg/dL 126(H) 115(H) 114(H)  BUN 8 - 23 mg/dL '17 15 14  '$ Creatinine 0.44 - 1.00 mg/dL 0.74 0.78 0.80  Sodium 135 - 145 mmol/L 142 142 142  Potassium 3.5 - 5.1 mmol/L 3.7 3.8 4.3  Chloride 98 - 111 mmol/L 109 109 105  CO2 22 - 32 mmol/L '26 23 31  '$ Calcium 8.9 - 10.3 mg/dL 9.7 9.5 10.0  Total Protein 6.1 - 8.1 g/dL - - 7.0  Total Bilirubin 0.2 - 1.2 mg/dL - - 0.5  Alkaline Phos 38 - 126 U/L - - -  AST 10 - 35 U/L - - 16  ALT 6 - 29 U/L - - 13   CBC Latest Ref Rng & Units 08/29/2020 02/07/2020 12/26/2019  WBC 4.0 - 10.5 K/uL 7.5 7.3 6.5  Hemoglobin 12.0 - 15.0 g/dL 13.2 14.3 14.5  Hematocrit 36.0 - 46.0 % 41.0 41.9 43.3  Platelets 150 - 400 K/uL 253 267 263   Lipid Panel     Component Value Date/Time   CHOL 180 12/26/2019 1053   TRIG 123 12/26/2019 1053   HDL 51 12/26/2019 1053   CHOLHDL 3.5 12/26/2019 1053   LDLCALC 107 (H) 12/26/2019 1053   HEMOGLOBIN A1C Lab Results  Component Value Date   HGBA1C 6.2 (H) 01/08/2020   MPG 131 01/08/2020   TSH No results for input(s): TSH in the last 8760 hours.   High-sensitivity troponin 05/16/2020: <6 Allergies   Allergies  Allergen Reactions   Norco [Hydrocodone-Acetaminophen] Nausea And Vomiting   Dust Mite Extract    Oxycontin [Oxycodone Hcl] Nausea And Vomiting   Pollen Extract     Medications Prior to Visit:   Outpatient Medications Prior to Visit  Medication Sig Dispense Refill   acetaminophen (TYLENOL) 500 MG tablet Take 25-500 mg by mouth every 6 (six) hours as needed for mild pain or headache.     azelastine (ASTELIN) 0.1 % nasal spray Place 1 to 2 sprays each nostril 1-2 times a day as needed for runny nose/drainage down throat (Patient taking differently: Place 1-2 sprays into both nostrils at bedtime as needed for rhinitis (drainage down throat).) 30 mL 5   clopidogrel (PLAVIX) 75 MG tablet TAKE 1 TABLET BY MOUTH  EVERY DAY (Patient taking differently: Take 75 mg by mouth daily.) 90 tablet 1   cycloSPORINE (RESTASIS)  0.05 % ophthalmic emulsion Place 1 drop into both eyes 2 (two) times daily.     diazepam (VALIUM) 5 MG tablet Take 0.5 tablets (2.5 mg total) by mouth as needed (anxiety). (Patient taking differently: Take 2.5 mg by mouth as needed for anxiety.) 30 tablet 0   fluticasone (FLONASE) 50 MCG/ACT nasal spray Place 1-2 sprays into both nostrils daily. (Patient taking differently: Place 1-2 sprays into both nostrils in the morning and at bedtime.) 16 g 5   GEMTESA 75 MG TABS Take 75 mg by mouth daily.     ketoconazole (NIZORAL) 2 % shampoo Apply 1 application topically See admin instructions. Twice weekly as needed for scalp irritation.     losartan (COZAAR) 50 MG tablet Take 1 tablet (50 mg total) by mouth daily. (Patient taking differently: Take 1 tablet by mouth every evening.) 90 tablet 1   nitroGLYCERIN (NITROSTAT) 0.4 MG SL tablet Place 1 tablet (0.4 mg total) under the tongue every 5 (five) minutes as needed for up to 25 days for chest pain. 25 tablet 3   Olopatadine HCl (PATADAY OP) Place 1 drop into both eyes daily.     omeprazole (PRILOSEC) 40 MG capsule TAKE 1 CAPSULE (40 MG TOTAL) BY MOUTH 2 (TWO) TIMES DAILY BEFORE A MEAL. (Patient taking differently: Take 40 mg by mouth in the morning and at bedtime.) 180 capsule 1   oxybutynin (DITROPAN-XL) 10 MG 24 hr tablet Take 10 mg by mouth daily.     sodium chloride (OCEAN) 0.65 % SOLN nasal spray Place 1 spray into both nostrils as needed for congestion.     SYNTHROID 100 MCG tablet TAKE 1 TABLET BY MOUTH EVERY DAY BEFORE BREAKFAST (Patient taking differently: Take 100 mcg by mouth daily before breakfast.) 90 tablet 1   Zolpidem Tartrate 3.5 MG SUBL Take 1 tablet by mouth at bedtime as needed. Dx K21.9 (Patient taking differently: Place 3.5 mg under the tongue at bedtime as needed (sleep).) 30 tablet 0   metoprolol tartrate (LOPRESSOR) 50 MG tablet  Take 1 tablet (50 mg total) by mouth as directed. Please take 1 tab twice daily, starting 2 days before the scan. Please take a dose 2 hours before your CT scan. Please carry the pills with you, in case you need additional pills to control your heart rate better during the scan. (Patient not taking: Reported on 09/04/2020) 10 tablet 1   albuterol (VENTOLIN HFA) 108 (90 Base) MCG/ACT inhaler Inhale 2 puffs into the lungs every 6 (six) hours as needed for wheezing or shortness of breath. 18 g 1   ezetimibe (ZETIA) 10 MG tablet Take 1 tablet (10 mg total) by mouth daily. (Patient not taking: No sig reported) 30 tablet 3   ipratropium (ATROVENT) 0.06 % nasal spray Place 2 sprays into both nostrils daily as needed for rhinitis.     MYRBETRIQ 50 MG TB24 tablet Take 50 mg by mouth daily. (Patient not taking: No sig reported)     pravastatin (PRAVACHOL) 40 MG tablet TAKE 1 TABLET BY MOUTH EVERYDAY AT BEDTIME (Patient taking differently: Take 40 mg by mouth at bedtime.) 90 tablet 2   No facility-administered medications prior to visit.   Final Medications at End of Visit    Current Meds  Medication Sig   acetaminophen (TYLENOL) 500 MG tablet Take 25-500 mg by mouth every 6 (six) hours as needed for mild pain or headache.   azelastine (ASTELIN) 0.1 % nasal spray Place 1 to 2 sprays each nostril 1-2 times  a day as needed for runny nose/drainage down throat (Patient taking differently: Place 1-2 sprays into both nostrils at bedtime as needed for rhinitis (drainage down throat).)   clopidogrel (PLAVIX) 75 MG tablet TAKE 1 TABLET BY MOUTH EVERY DAY (Patient taking differently: Take 75 mg by mouth daily.)   cycloSPORINE (RESTASIS) 0.05 % ophthalmic emulsion Place 1 drop into both eyes 2 (two) times daily.   diazepam (VALIUM) 5 MG tablet Take 0.5 tablets (2.5 mg total) by mouth as needed (anxiety). (Patient taking differently: Take 2.5 mg by mouth as needed for anxiety.)   fluticasone (FLONASE) 50 MCG/ACT nasal  spray Place 1-2 sprays into both nostrils daily. (Patient taking differently: Place 1-2 sprays into both nostrils in the morning and at bedtime.)   GEMTESA 75 MG TABS Take 75 mg by mouth daily.   ketoconazole (NIZORAL) 2 % shampoo Apply 1 application topically See admin instructions. Twice weekly as needed for scalp irritation.   losartan (COZAAR) 50 MG tablet Take 1 tablet (50 mg total) by mouth daily. (Patient taking differently: Take 1 tablet by mouth every evening.)   nitroGLYCERIN (NITROSTAT) 0.4 MG SL tablet Place 1 tablet (0.4 mg total) under the tongue every 5 (five) minutes as needed for up to 25 days for chest pain.   Olopatadine HCl (PATADAY OP) Place 1 drop into both eyes daily.   omeprazole (PRILOSEC) 40 MG capsule TAKE 1 CAPSULE (40 MG TOTAL) BY MOUTH 2 (TWO) TIMES DAILY BEFORE A MEAL. (Patient taking differently: Take 40 mg by mouth in the morning and at bedtime.)   oxybutynin (DITROPAN-XL) 10 MG 24 hr tablet Take 10 mg by mouth daily.   sodium chloride (OCEAN) 0.65 % SOLN nasal spray Place 1 spray into both nostrils as needed for congestion.   SYNTHROID 100 MCG tablet TAKE 1 TABLET BY MOUTH EVERY DAY BEFORE BREAKFAST (Patient taking differently: Take 100 mcg by mouth daily before breakfast.)   Zolpidem Tartrate 3.5 MG SUBL Take 1 tablet by mouth at bedtime as needed. Dx K21.9 (Patient taking differently: Place 3.5 mg under the tongue at bedtime as needed (sleep).)   Radiology:   CXR 03/11/2019: Cardiac shadow is within normal limits. Aortic calcifications are seen. The lungs are clear. No bony abnormality is noted.  MRI brain 09/20/2019: Normal.  CT angiogram head 02/08/2020: 1. No emergent finding or specific explanation for headache. 2. Remote right occipital infarction.  MR cervical spine 02/26/2020: 1. No acute osseous abnormality in the cervical spine. 2. Mild multifactorial degenerative spinal stenosis C4-C5 through C6-C7. Very mild cord mass effect but no cord signal  abnormality. 3. Associated severe degenerative foraminal stenosis at the bilateral C5 nerve levels.  Up to moderate left C6 and left C7 nerve level foraminal stenosis  Cardiac Studies:   Stress Echocardiogram Jun 09, 2017:  Resting EKG demonstrated normal sinus rhythm. Stress EKG is negative for myocardial ischemia, patient exercised for 4:29 minutes, achieved 6.2 Mets. Normal blood pressure response. No evidence of ischemia. Stress echocardiogram: Normal LV systolic function, no stress induced wall motion abnormality. EF 75-80%.  Nocturnal oximetry 04/18/2019: SPO2 88% for 6 minutes SPO2 less than 89% 11 minutes. Oxygen desaturation events 03/26/1940.  Desaturation index 42.  Minimum heart rate 54, maximum heart rate 89 bpm, average heart rate 60 bpm.  Percentage in bradycardia 66%.  Highest SPO2 99%, lowest SPO2 84%. Impression: Patient qualifies for nocturnal oxygen supplementation per Medicare guidelines Group 1.  Remote outpatient cardiac telemetry 04/24/2019 through 05/08/2019: Predominant rhythm is normal sinus rhythm.  Minimum  heart rate 49, average heart rate 72 and maximum heart rate 195 bpm. PVC burden <1%.  There were 0 ventricular couplets and 0 ventricular runs. PAC burden <1%.  There was 1 supraventricular run of 5 beats.  No atrial fibrillation.  No heart block. 12 patient activated events correlated with sinus rhythm.  PCV MYOCARDIAL PERFUSION WO LEXISCAN 04/14/2020 Normal ECG stress. The patient exercised for 5 minutes and 0 seconds of a Bruce protocol, achieving approximately 7.03 METs.  Normal BP response. Reduced exercise tolerance. Non-limiting chest pain with exercise. Myocardial perfusion is normal. Overall LV systolic function is normal without regional wall motion abnormalities. Stress LV EF: 63%. No previous exam available for comparison. Low risk.  PCV ECHOCARDIOGRAM COMPLETE [id number] Normal LV systolic function with visual EF 60-65%. Left ventricle cavity  is normal in size. Normal global wall motion. Normal diastolic filling pattern, normal LAP. Mild tricuspid regurgitation. Compared to prior study dated 04/24/2019: no significant change.  Coronary CTA 09/01/2020: 1. No acute findings in the imaged extracardiac chest. 2. Small to moderate hiatal hernia. 3. Aortic Atherosclerosis (ICD10-I70.0).   1. Total coronary calcium score of 0 AU.  2. Normal coronary origin with left dominance.  3. Aortic atherosclerosis.  4. CAD-RADS 0: No evidence of epicardial coronary artery disease (0%). RECOMMENDATIONS: Consider non-atherosclerotic causes of chest pain.  EKG  09/04/2020: Sinus rhythm at a rate of 71 bpm.  Normal axis.  Incomplete right bundle branch block.  Nonspecific T wave abnormality.  Compared to EKG 04/04/2020, no significant change.  EKG 02/07/2020: Normal sinus rhythm at rate of 74 bpm, normal axis, poor R wave progression, probably normal variant.  Low-voltage.  EKG 04/12/2019: Normal sinus rhythm with rate of 75 bpm, left atrial enlargement, normal axis.  Nonspecific anterolateral T wave normality, cannot exclude ischemia.  Compared to 12/21/2017, no significant change.  Assessment     ICD-10-CM   1. Precordial pain  R07.2     2. Essential hypertension  I10 EKG 12-Lead    3. Pure hypercholesterolemia  E78.00 pravastatin (PRAVACHOL) 40 MG tablet      Meds ordered this encounter  Medications   pravastatin (PRAVACHOL) 40 MG tablet    Sig: Take 1 tablet (40 mg total) by mouth daily. TAKE 1 TABLET BY MOUTH EVERYDAY AT BEDTIME    Dispense:  90 tablet    Refill:  2    Medications Discontinued During This Encounter  Medication Reason   albuterol (VENTOLIN HFA) 108 (90 Base) MCG/ACT inhaler Error   ezetimibe (ZETIA) 10 MG tablet Error   ipratropium (ATROVENT) 0.06 % nasal spray Error   MYRBETRIQ 50 MG TB24 tablet Error   pravastatin (PRAVACHOL) 40 MG tablet Error    Orders Placed This Encounter  Procedures   EKG 12-Lead     Recommendations:   Darlene [name]  is a  87 y.o. Caucasian female with hypertension, hyperlipidemia, h/o sleep apnea not on CPAP, prior history of stroke in 2006 without residual defect, on Plavix chronically. She has had frequent palpitations, difficulty in controlling hypertension, extreme anxiety, multiple medication intolerances.  However with the present combination of medications, blood pressure has been well controlled.   Patient was recently evaluated in the ED for chest pain and opted to undergo coronary CTA, she now presents for results.  Coronary CTA revealed no coronary artery disease, in fact coronary calcium score of 0 with normal coronary arteries.  CTA did reveal aortic atherosclerosis, will continue pravastatin.  Also observed small to moderate hiatal hernia, discussed with  patient that this could contribute to patient's symptoms of chest discomfort.  She follows with GI, I advised her to reach out to GI provider for follow-up and further recommendations.  Patient is experiencing significant life stressors, particularly regarding her present living situation, which I suspect is contributing to her presentation.  Advised patient regarding the importance of establishing with PCP.   In regard to hyperlipidemia, will continue to take pravastatin.  However it is unclear whether she is taking Zetia, has therefore asked patient to clarify from home.  We will also plan to repeat lipid profile testing to evaluate LDL control.  Blood pressure is elevated in the office today, however it is typically well controlled.  Suspect elevated blood pressure today is due to stress and agitation surrounding today's visit.  We will continue to monitor regularly.  Follow-up as previously scheduled in 2 months, sooner if needed.   Darlene Berthold, PA-C 09/04/2020, 11:57 AM Office: [phone]

## 2020-09-09 NOTE — Telephone Encounter (Signed)
Pt mention she wants to keep her appt on 10/21

## 2020-09-18 ENCOUNTER — Ambulatory Visit (INDEPENDENT_AMBULATORY_CARE_PROVIDER_SITE_OTHER): Payer: Medicare Other | Admitting: Otolaryngology

## 2020-09-18 ENCOUNTER — Other Ambulatory Visit: Payer: Self-pay

## 2020-09-18 DIAGNOSIS — J310 Chronic rhinitis: Secondary | ICD-10-CM | POA: Diagnosis not present

## 2020-09-18 DIAGNOSIS — H903 Sensorineural hearing loss, bilateral: Secondary | ICD-10-CM

## 2020-09-18 NOTE — Progress Notes (Signed)
HPI: Darlene [name] is a 87 y.o. female who returns today for evaluation of congestion of her ears.  She recently went on a flight and had trouble popping her ears.  She has history of allergies and uses nasal steroid sprays on a regular basis.  She has noticed some decrease in her hearing.  She had an audiogram performed in July 2021 that demonstrated a mild to moderate bilateral sensorineural hearing loss with SRT's of 30 dB on the right and 25 dB on the left..  Past Medical History:  Diagnosis Date   Anxiety    Colon polyps    Depression    Hyperlipidemia    Hypertension    Hypothyroidism    Melanoma in situ ([place]) 09/19/2018   Right Arm (excision)   Skin cancer    Sleep apnea    Stroke [facility]) 2008   Past Surgical History:  Procedure Laterality Date   ADENOIDECTOMY     APPENDECTOMY  1960   CATARACT EXTRACTION Left    COLONOSCOPY     GUM SURGERY  03/2019   LUMBAR [place] SURGERY  2014   MELANOMA EXCISION  2020   MESH APPLIED TO LAP PORT     TONSILLECTOMY  1940   Social History   Socioeconomic History   Marital status: Widowed    Spouse name: Not on file   Number of children: 3   Years of education: Not on file   Highest education level: Not on file  Occupational History   Occupation: retired Radiographer, therapeutic  Tobacco Use   Smoking status: Never   Smokeless tobacco: Never  Vaping Use   Vaping Use: Never used  Substance and Sexual Activity   Alcohol use: Yes    Comment: hardly ever   Drug use: Never   Sexual activity: Not on file  Other Topics Concern   Not on file  Social History Narrative   Social History      Diet? ok      Do you drink/eat things with caffeine? Weak coffee, (much milk)      Marital status?          divorced                          What year were you married? 1961      Do you live in a house, apartment, assisted living, condo, trailer, etc.? apartment      Is it one or more stories? 4      How many persons live in your home?  Only me      Do you have any pets in your home? (please list) no      Highest level of education completed? 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             yes                         Type & how often? Walk- would love to return to water exercise      Advanced Directives      Do you have a living will? yes      Do you have a DNR form?           no  If not, do you want to discuss one? no      Do you have signed POA/HPOA for forms? no      Functional Status      Do you have difficulty bathing or dressing yourself?  no      Do you have difficulty preparing food or eating? no      Do you have difficulty managing your medications? no      Do you have difficulty managing your finances?  no      Do you have difficulty affording your medications? no      Social Determinants of Health   Financial Resource Strain: Not on file  Food Insecurity: Not on file  Transportation Needs: Not on file  Physical Activity: Not on file  Stress: Not on file  Social Connections: Not on file   Family History  Problem Relation Age of Onset   Lung cancer Mother 72       smoker   Heart disease Mother    Stroke Father 29   Heart attack Father    Allergic rhinitis Sister    Hypertension Sister    CVA Brother    Lung cancer Maternal Grandfather    Heart attack Paternal Grandfather    Melanoma Daughter    Psoriasis Daughter    Rheum arthritis Daughter    Bipolar disorder Daughter    Colon cancer Neg Hx    Esophageal cancer Neg Hx    Inflammatory bowel disease Neg Hx    Liver disease Neg Hx    Pancreatic cancer Neg Hx    Stomach cancer Neg Hx    Rectal cancer Neg Hx    Allergies  Allergen Reactions   Norco [Hydrocodone-Acetaminophen] Nausea And Vomiting   Dust Mite Extract    Oxycontin [Oxycodone Hcl] Nausea And Vomiting   Pollen Extract    Prior to Admission medications   Medication  Sig Start Date End Date Taking? Authorizing Provider  acetaminophen (TYLENOL) 500 MG tablet Take 25-500 mg by mouth every 6 (six) hours as needed for mild pain or headache.    [provider]  azelastine (ASTELIN) 0.1 % nasal spray Place 1 to 2 sprays each nostril 1-2 times a day as needed for runny nose/drainage down throat Patient taking differently: Place 1-2 sprays into both nostrils at bedtime as needed for rhinitis (drainage down throat). 07/31/20   Althea Charon, FNP  clopidogrel (PLAVIX) 75 MG tablet TAKE 1 TABLET BY MOUTH EVERY DAY Patient taking differently: Take 75 mg by mouth daily. 08/06/20   [name], [name], NP  cycloSPORINE (RESTASIS) 0.05 % ophthalmic emulsion Place 1 drop into both eyes 2 (two) times daily.    [provider]  diazepam (VALIUM) 5 MG tablet Take 0.5 tablets (2.5 mg total) by mouth as needed (anxiety). Patient taking differently: Take 2.5 mg by mouth as needed for anxiety. 11/26/19   Orma Flaming, MD  fluticasone (FLONASE) 50 MCG/ACT nasal spray Place 1-2 sprays into both nostrils daily. Patient taking differently: Place 1-2 sprays into both nostrils in the morning and at bedtime. 07/31/20   Althea Charon, FNP  GEMTESA 75 MG TABS Take 75 mg by mouth daily. 07/18/20   [provider]  ketoconazole (NIZORAL) 2 % shampoo Apply 1 application topically See admin instructions. Twice weekly as needed for scalp irritation. 01/01/20   [provider]  losartan (COZAAR) 50 MG tablet Take 1 tablet (50 mg total) by mouth daily. Patient taking differently: Take 1 tablet by  mouth every evening. 05/28/20   [name], [name], NP  metoprolol tartrate (LOPRESSOR) 50 MG tablet Take 1 tablet (50 mg total) by mouth as directed. Please take 1 tab twice daily, starting 2 days before the scan. Please take a dose 2 hours before your CT scan. Please carry the pills with you, in case you need additional pills to control your heart rate better during the scan. Patient not  taking: Reported on 09/04/2020 08/29/20 11/27/20  Cantwell, Anderson Malta C, PA-C  nitroGLYCERIN (NITROSTAT) 0.4 MG SL tablet Place 1 tablet (0.4 mg total) under the tongue every 5 (five) minutes as needed for up to 25 days for chest pain. 04/04/20 10/25/20  Adrian Prows, MD  Olopatadine HCl (PATADAY OP) Place 1 drop into both eyes daily.    [provider]  omeprazole (PRILOSEC) 40 MG capsule TAKE 1 CAPSULE (40 MG TOTAL) BY MOUTH 2 (TWO) TIMES DAILY BEFORE A MEAL. Patient taking differently: Take 40 mg by mouth in the morning and at bedtime. 06/24/20   Mansouraty, Telford Nab., MD  oxybutynin (DITROPAN-XL) 10 MG 24 hr tablet Take 10 mg by mouth daily. 03/27/20   [provider]  pravastatin (PRAVACHOL) 40 MG tablet Take 1 tablet (40 mg total) by mouth daily. TAKE 1 TABLET BY MOUTH EVERYDAY AT BEDTIME 09/04/20   Cantwell, Celeste C, PA-C  sodium chloride (OCEAN) 0.65 % SOLN nasal spray Place 1 spray into both nostrils as needed for congestion.    [provider]  SYNTHROID 100 MCG tablet TAKE 1 TABLET BY MOUTH EVERY DAY BEFORE BREAKFAST Patient taking differently: Take 100 mcg by mouth daily before breakfast. 04/21/20   Virgie Dad, MD  Zolpidem Tartrate 3.5 MG SUBL Take 1 tablet by mouth at bedtime as needed. Dx K21.9 Patient taking differently: Place 3.5 mg under the tongue at bedtime as needed (sleep). 07/28/20   [name], [name], NP     Positive ROS: Otherwise negative  All other systems have been reviewed and were otherwise negative with the exception of those mentioned in the HPI and as above.  Physical Exam: Constitutional: Alert, well-appearing, no acute distress Ears: External ears without lesions or tenderness. Ear canals are clear bilaterally with no significant wax buildup.  TMs are clear bilaterally.  I was able to insufflate air behind both TMs without difficulty.  On hearing screening with the 512 1024 tuning fork Weber was midline.  AC was greater than BC bilaterally.   Subjectively with a 1024 tuning fork she had a mild hearing loss in both ears. Nasal: External nose without lesions. Septum with minimal deformity and mild rhinitis.  Both middle meatus regions were clear.. Clear nasal passages Oral: Lips and gums without lesions. Tongue and palate mucosa without lesions. Posterior oropharynx clear. Neck: No palpable adenopathy or masses Respiratory: Breathing comfortably  Skin: No facial/neck lesions or rash noted.  Procedures  Assessment: She has sensorineural hearing loss that would warrant use of hearing aids and discussed this with her.  Plan: She has sensorineural hearing loss and recommended consideration of use of hearing aids. She will follow-up as needed.   Radene Journey, MD

## 2020-09-25 ENCOUNTER — Ambulatory Visit: Payer: Medicare Other | Admitting: Family

## 2020-10-17 NOTE — Patient Instructions (Addendum)
1. Seasonal and perennial allergic rhinitis (trees, weeds, grasses, indoor molds, outdoor molds, dust mites, cat, dog and cockroach) -We will hold off on using antihistamines due to them causing grogginess. We will also hold off on montelukast due to you trying it in the past and it not being effective - Continue with: fluticasone nasal spray 1-2 sprays each nostril once a day.In the right nostril, point the applicator out toward the right ear. In the left nostril, point the applicator out toward the left ear  -Continue Astelin (azelastine) 2 sprays per nostril daily as needed.  Consider scheduling a new patient appointment with Dr. Benjamine Mola (ENT), since Dr. Lucia Gaskins is retiring. Call our office if you need a referral.   2. Possible asthma/wheeze Your breathing test looks great today. - Daily controller medication(s): none - Prior to physical activity: albuterol 2 puffs 10-15 minutes before physical activity. - Rescue medications: albuterol 4 puffs every 4-6 hours as needed - Asthma control goals: * Full participation in all desired activities (may need albuterol before activity) * Albuterol use two time or less a week on average (not counting use with activity) * Cough interfering with sleep two time or less a month * Oral steroids no more than once a year * No hospitalizations   Please let us know if this treatment plan is not working well for you. Schedule a follow up appointment in 3 months or sooner if needed.

## 2020-10-19 ENCOUNTER — Other Ambulatory Visit: Payer: Self-pay | Admitting: Nurse Practitioner

## 2020-10-19 DIAGNOSIS — K219 Gastro-esophageal reflux disease without esophagitis: Secondary | ICD-10-CM

## 2020-10-20 ENCOUNTER — Other Ambulatory Visit: Payer: Self-pay

## 2020-10-20 ENCOUNTER — Encounter: Payer: Self-pay | Admitting: Family

## 2020-10-20 ENCOUNTER — Ambulatory Visit: Payer: Medicare Other | Admitting: Family

## 2020-10-20 ENCOUNTER — Telehealth: Payer: Self-pay | Admitting: Physician Assistant

## 2020-10-20 VITALS — BP 128/78 | HR 78 | Temp 97.2°F | Resp 16 | Ht 63.0 in | Wt 158.2 lb

## 2020-10-20 DIAGNOSIS — J302 Other seasonal allergic rhinitis: Secondary | ICD-10-CM | POA: Diagnosis not present

## 2020-10-20 DIAGNOSIS — J3089 Other allergic rhinitis: Secondary | ICD-10-CM

## 2020-10-20 DIAGNOSIS — R062 Wheezing: Secondary | ICD-10-CM | POA: Diagnosis not present

## 2020-10-20 MED ORDER — KETOCONAZOLE 2 % EX SHAM: 1.0000 "application " | MEDICATED_SHAMPOO | CUTANEOUS | 1 refills | Status: DC

## 2020-10-20 NOTE — Telephone Encounter (Signed)
Patient came by office stating that she needed a refill on her shampoo for scalp dermatitis-Ketaconazole.  Patient said she called pharmacy and that the prescription was denied by [place]?  Patient has an appointment 11/04/2020.

## 2020-10-20 NOTE — Progress Notes (Signed)
[facility] [place] [address] Dept: [phone]  FOLLOW UP NOTE  Patient ID: Darlene [name], female    DOB: 08-08-1933  Age: 87 y.o. MRN: [id number] Date of Office Visit: 10/20/2020  Assessment  Chief Complaint: Follow-up (Patient in today for a follow up. She states she is having trouble with her allergies and having trouble breathing and is stuffy in her nose.)  HPI Darlene [name] is an 87 year old female who presents today for follow-up of seasonal and perennial allergic rhinitis and wheeze.  She was last seen on July 31, 2020 by Althea Charon, FNP.  Since her last office visit she denies any new diagnosis or surgery.  Seasonal and perennial allergic rhinitis is reported as not well controlled with Flonase 2 sprays each nostril once a day, sinus rinse at night, saline spray as needed during the day, and azelastine 2 sprays each nostril at night.  After discussing she was not using proper technique with her Flonase nasal spray.  She reports postnasal drip that is often white, nasal congestion, right ear stuffiness, and rhinorrhea that is white with occasionally something dark or a little bit of blood in it.  She has not had any sinus infections since we last saw her.  She is not able to take any antihistamines due to them causing grogginess.  She also has tried montelukast in the past but stopped due to it not making any difference in her symptoms.  She has also tried Mucinex and this did not make a difference in her symptoms also.  She reports that her symptoms all started after seeing a periodontist and having surgery on the right side and/or moving into her new apartment.  She reports that her apartment has popcorn ceiling and the place is so dusty.  She can see fine dust a day after dusting.  She is locked into this apartment.  She reports that Dr. Lucia Gaskins recommended that she move, but she is not able to.  She also mentions that she last saw Dr. Lucia Gaskins a month ago due to right ear  stuffiness and he had her drink something to help with the right ear stuffiness.  In the past he has also done something to pop her ear.  She  mentions that he is retiring and she does not know of any other ear nose and throat doctors.  She  had a CT maxillofacial without contrast on April 30,2022 showing: "Overall mild paranasal sinus inflammatory changes. Partial occlusion of the left infundibulum. Drainage pathways otherwise patent."  Possible asthma/wheeze is reported as moderately controlled.  She has not had to use albuterol, but reports that she does not know how to use albuterol. Demonstration of how to use albuterol given.  She reports cough due to drainage.  She also mentions that sometimes when she swallows that she coughs.  She has had an endoscopy and sees GI for which they have her on a reflux medication.  She also reports wheezing from drainage and shortness of breath from nasal congestion.  She denies any tightness in her chest.  She has not been on any steroids due to breathing problems and has not made any trips to the emergency room or urgent care due to breathing problems.   Drug Allergies:  Allergies  Allergen Reactions   Norco [Hydrocodone-Acetaminophen] Nausea And Vomiting   Dust Mite Extract    Oxycontin [Oxycodone Hcl] Nausea And Vomiting   Pollen Extract     Review of Systems: Review of Systems  Constitutional:  Negative for chills and fever.  HENT:         Reports white post nasal drip, nasal congestion, stuffy right ear, and rhinorrhea that can be white and at times have a small amount of dark color or blood.  Eyes:        Reports watery eyes due to dry eyes. Denies itchy eyes  Respiratory:  Positive for cough, shortness of breath and wheezing.        Reports wheezing due to drainage, reports shortness of breath due to nasal congestion , and cough due to drainage and at times when she swallows  Cardiovascular:  Negative for chest pain and palpitations.   Gastrointestinal:        Denies heartburn and reflux symptoms. Currently taking medication for reflux from GI  Genitourinary:  Negative for frequency.  Skin:  Positive for itching. Negative for rash.       Reports itching due to keratosis  Neurological:  Positive for headaches.       Reports occasional headache for which Tylenol helps  Endo/Heme/Allergies:  Positive for environmental allergies.    Physical Exam: BP 128/78   Pulse 78   Temp (!) 97.2 F (36.2 C) (Temporal)   Resp 16   Ht 5\' 3"  (1.6 m)   Wt 347 lb 3.2 oz (71.8 kg)   SpO2 97%   BMI 28.02 kg/m    Physical Exam Constitutional:      Appearance: Normal appearance.  HENT:     Head: Normocephalic and atraumatic.     Comments: Pharynx normal. Eyes normal. Ears normal. Nose: bilateral lower turbinates mildly edematous and moderately erythematous with no drainage noted    Right Ear: Tympanic membrane, ear canal and external ear normal.     Left Ear: Tympanic membrane, ear canal and external ear normal.     Mouth/Throat:     Mouth: Mucous membranes are moist.     Pharynx: Oropharynx is clear.  Eyes:     Conjunctiva/sclera: Conjunctivae normal.  Cardiovascular:     Rate and Rhythm: Normal rate and regular rhythm.     Heart sounds: Normal heart sounds.  Pulmonary:     Effort: Pulmonary effort is normal.     Breath sounds: Normal breath sounds.     Comments: Lungs clear to auscultation Musculoskeletal:     Cervical back: Neck supple.  Skin:    General: Skin is warm.  Neurological:     Mental Status: She is alert and oriented to person, place, and time.  Psychiatric:        Mood and Affect: Mood normal.        Behavior: Behavior normal.        Thought Content: Thought content normal.        Judgment: Judgment normal.    Diagnostics: FVC 1.90 L, FEV1 1.60 L.  Predicted FVC 2.27 L, predicted FEV1 1.67 L.  Spirometry indicates normal ventilatory function.  Assessment and Plan: 1. Seasonal and perennial  allergic rhinitis   2. Wheeze     No orders of the defined types were placed in this encounter.   Patient Instructions  1. Seasonal and perennial allergic rhinitis (trees, weeds, grasses, indoor molds, outdoor molds, dust mites, cat, dog and cockroach) -We will hold off on using antihistamines due to them causing grogginess. We will also hold off on montelukast due to you trying it in the past and it not being effective - Continue with: fluticasone nasal spray 1-2 sprays each nostril  once a day.In the right nostril, point the applicator out toward the right ear. In the left nostril, point the applicator out toward the left ear  -Continue Astelin (azelastine) 2 sprays per nostril daily as needed.  Consider scheduling a new patient appointment with Dr. Benjamine Mola (ENT), since Dr. Lucia Gaskins is retiring. Call our office if you need a referral.   2. Possible asthma/wheeze Your breathing test looks great today. - Daily controller medication(s): none - Prior to physical activity: albuterol 2 puffs 10-15 minutes before physical activity. - Rescue medications: albuterol 4 puffs every 4-6 hours as needed - Asthma control goals: * Full participation in all desired activities (may need albuterol before activity) * Albuterol use two time or less a week on average (not counting use with activity) * Cough interfering with sleep two time or less a month * Oral steroids no more than once a year * No hospitalizations   Please let us know if this treatment plan is not working well for you. Schedule a follow up appointment in 3 months or sooner if needed.   Return in about 3 months (around 01/20/2021), or if symptoms worsen or fail to improve.    Thank you for the opportunity to care for this patient.  Please do not hesitate to contact me with questions.  Althea Charon, FNP [facility]

## 2020-10-29 ENCOUNTER — Other Ambulatory Visit: Payer: Self-pay

## 2020-10-29 ENCOUNTER — Ambulatory Visit: Payer: Medicare Other | Admitting: Gastroenterology

## 2020-10-29 ENCOUNTER — Encounter: Payer: Self-pay | Admitting: Gastroenterology

## 2020-10-29 VITALS — BP 132/74 | HR 77 | Ht 63.0 in | Wt 158.5 lb

## 2020-10-29 DIAGNOSIS — R053 Chronic cough: Secondary | ICD-10-CM | POA: Diagnosis not present

## 2020-10-29 DIAGNOSIS — R933 Abnormal findings on diagnostic imaging of other parts of digestive tract: Secondary | ICD-10-CM | POA: Diagnosis not present

## 2020-10-29 DIAGNOSIS — R12 Heartburn: Secondary | ICD-10-CM | POA: Diagnosis not present

## 2020-10-29 DIAGNOSIS — R1310 Dysphagia, unspecified: Secondary | ICD-10-CM

## 2020-10-29 MED ORDER — OMEPRAZOLE 40 MG PO CPDR
40.0000 mg | DELAYED_RELEASE_CAPSULE | Freq: Two times a day (BID) | ORAL | 4 refills | Status: DC
Start: 2020-10-29 — End: 2021-01-22

## 2020-10-29 NOTE — Patient Instructions (Addendum)
We have sent the following medications to your pharmacy for you to pick up at your convenience: Omeprazole   You will need a follow up in Feb 2023. Office will contact you to schedule at later time.   If you are age 85 or older, your body mass index should be between 23-30. Your Body mass index is 28.08 kg/m. If this is out of the aforementioned range listed, please consider follow up with your Primary Care Provider.  If you are age 85 or younger, your body mass index should be between 19-25. Your Body mass index is 28.08 kg/m. If this is out of the aformentioned range listed, please consider follow up with your Primary Care Provider.   __________________________________________________________  The [facility] providers would like to encourage you to use [facility] to communicate with providers for non-urgent requests or questions.  Due to long hold times on the telephone, sending your provider a message by [facility] may be a faster and more efficient way to get a response.  Please allow 48 business hours for a response.  Please remember that this is for non-urgent requests.   Thank you for choosing me and [facility].  Dr. Rush Landmark

## 2020-10-29 NOTE — Progress Notes (Signed)
Use since of periodontal  GASTROENTEROLOGY OUTPATIENT CLINIC VISIT   Primary Care Provider [name], [name], NP [address] [place] [address] [phone]  Patient Profile: Darlene [name] is a 87 y.o. female with a pmh significant for CVA (on Plavix), atrial fibrillation, anxiety/depression, sleep apnea, colon polyps (unknown pathology), status post appendectomy, seborrheic keratoses (seen dermatology in the past), chronic sore throat, esophagitis, hiatal hernia, diverticulosis, hemorrhoids, colonic AVM, adenomatous colon polyps.  The patient presents to the [facility] for an evaluation and management of problem(s) noted below:  Problem List 1. Pyrosis   2. Dysphagia, unspecified type   3. Abnormal barium swallow   4. Chronic cough      History of Present Illness Please see prior progress notes for full details of HPI.   Interval History Today, the patient presents for a follow-up.  Since the last visit the patient has been evaluated by PA [place].  She underwent an SLP evaluation and modified barium swallow.  The patient has been experiencing issues since her periodontal work.  This has been a significant concern for her continues to be in regards to sinus issues and now potentially hearing issues.  From a GI standpoint however she continues to have a chronic cough.  Dysphagia symptoms do occur at times but are not too significant for her.  She remains on PPI therapy.  At a recent wedding she was speaking with another physician and after hearing her symptoms it was also recommended that she consider taking her PPI twice daily.  No significant abdominal pain or vomiting or nausea.  She just wishes that her sinus issues were better.  The patient does very infrequently use nitroglycerin for chest discomfort.  She believes that it may have helped at one point with an episode of an esophageal discomfort/spasm.  GI Review of Systems Positive as above Negative for  odynophagia, abdominal pain, alteration bowel habits, melena, hematochezia, bloating   Review of Systems General: Denies fevers/chills/weight loss unintentionally Cardiovascular: Denies chest pain Pulmonary: Denies shortness of breath Gastroenterological: See HPI Genitourinary: Denies darkened urine Hematological: Positive for history of easy bruising/bleeding due to Plavix Dermatological: Denies jaundice Psychological: Mood is stable   Medications Current Outpatient Medications  Medication Sig Dispense Refill   acetaminophen (TYLENOL) 500 MG tablet Take 25-500 mg by mouth every 6 (six) hours as needed for mild pain or headache.     azelastine (ASTELIN) 0.1 % nasal spray Place 1 to 2 sprays each nostril 1-2 times a day as needed for runny nose/drainage down throat (Patient taking differently: Place 1-2 sprays into both nostrils at bedtime as needed for rhinitis (drainage down throat).) 30 mL 5   clopidogrel (PLAVIX) 75 MG tablet TAKE 1 TABLET BY MOUTH EVERY DAY 90 tablet 1   cycloSPORINE (RESTASIS) 0.05 % ophthalmic emulsion Place 1 drop into both eyes 2 (two) times daily.     fluticasone (FLONASE) 50 MCG/ACT nasal spray Place 1-2 sprays into both nostrils daily. (Patient taking differently: Place 1-2 sprays into both nostrils in the morning and at bedtime.) 16 g 5   GEMTESA 75 MG TABS Take 75 mg by mouth daily.     ketoconazole (NIZORAL) 2 % shampoo Apply 1 application topically See admin instructions. Twice weekly as needed for scalp irritation. 120 mL 1   losartan (COZAAR) 50 MG tablet Take 1 tablet (50 mg total) by mouth daily. 90 tablet 1   nitroGLYCERIN (NITROSTAT) 0.4 MG SL tablet Place 1 tablet (0.4 mg total) under the  tongue every 5 (five) minutes as needed for up to 25 days for chest pain. 25 tablet 3   Olopatadine HCl (PATADAY OP) Place 1 drop into both eyes daily.     pravastatin (PRAVACHOL) 40 MG tablet Take 1 tablet (40 mg total) by mouth daily. TAKE 1 TABLET BY MOUTH EVERYDAY  AT BEDTIME 90 tablet 2   sodium chloride (OCEAN) 0.65 % SOLN nasal spray Place 1 spray into both nostrils as needed for congestion.     SYNTHROID 100 MCG tablet TAKE 1 TABLET BY MOUTH EVERY DAY BEFORE BREAKFAST 90 tablet 1   Zolpidem Tartrate 3.5 MG SUBL TAKE 1 TABLET BY MOUTH AT BEDTIME AS NEEDED. 30 tablet 0   diazepam (VALIUM) 5 MG tablet Take 0.5 tablets (2.5 mg total) by mouth as needed (anxiety). WILL NEED APPOINTMENT FOR ADDITIONAL REFILLS 15 tablet 0   omeprazole (PRILOSEC) 40 MG capsule Take 1 capsule (40 mg total) by mouth 2 (two) times daily before a meal. 180 capsule 4   No current facility-administered medications for this visit.    Allergies Allergies  Allergen Reactions   Norco [Hydrocodone-Acetaminophen] Nausea And Vomiting   Dust Mite Extract    Oxycontin [Oxycodone Hcl] Nausea And Vomiting   Pollen Extract     Histories Past Medical History:  Diagnosis Date   Anxiety    Colon polyps    Depression    Hyperlipidemia    Hypertension    Hypothyroidism    Melanoma in situ ([facility]) 09/19/2018   Right Arm (excision)   Skin cancer    Sleep apnea    Stroke [facility]) 2008   Past Surgical History:  Procedure Laterality Date   ADENOIDECTOMY     APPENDECTOMY  1960   CATARACT EXTRACTION Left    COLONOSCOPY     GUM SURGERY  03/2019   LUMBAR [place] SURGERY  2014   MELANOMA EXCISION  2020   MESH APPLIED TO LAP PORT     TONSILLECTOMY  1940   Social History   Socioeconomic History   Marital status: Widowed    Spouse name: Not on file   Number of children: 3   Years of education: Not on file   Highest education level: Not on file  Occupational History   Occupation: retired Radiographer, therapeutic  Tobacco Use   Smoking status: Never   Smokeless tobacco: Never  Vaping Use   Vaping Use: Never used  Substance and Sexual Activity   Alcohol use: Yes    Comment: hardly ever   Drug use: Never   Sexual activity: Not on file  Other Topics Concern   Not on file   Social History Narrative   Social History      Diet? ok      Do you drink/eat things with caffeine? Weak coffee, (much milk)      Marital status?          divorced                          What year were you married? 1961      Do you live in a house, apartment, assisted living, condo, trailer, etc.? apartment      Is it one or more stories? 4      How many persons live in your home? Only me      Do you have any pets in your home? (please list) no      Highest level of  education completed? 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             yes                         Type & how often? Walk- would love to return to water exercise      Advanced Directives      Do you have a living will? yes      Do you have a DNR form?           no                       If not, do you want to discuss one? no      Do you have signed POA/HPOA for forms? no      Functional Status      Do you have difficulty bathing or dressing yourself?  no      Do you have difficulty preparing food or eating? no      Do you have difficulty managing your medications? no      Do you have difficulty managing your finances?  no      Do you have difficulty affording your medications? no      Social Determinants of Health   Financial Resource Strain: Not on file  Food Insecurity: Not on file  Transportation Needs: Not on file  Physical Activity: Not on file  Stress: Not on file  Social Connections: Not on file  Intimate Partner Violence: Not on file   Family History  Problem Relation Age of Onset   Lung cancer Mother 65       smoker   Heart disease Mother    Stroke Father 70   Heart attack Father    Allergic rhinitis Sister    Hypertension Sister    CVA Brother    Lung cancer Maternal Grandfather    Heart attack Paternal Grandfather    Melanoma Daughter    Psoriasis Daughter    Rheum arthritis Daughter    Bipolar  disorder Daughter    Colon cancer Neg Hx    Esophageal cancer Neg Hx    Inflammatory bowel disease Neg Hx    Liver disease Neg Hx    Pancreatic cancer Neg Hx    Stomach cancer Neg Hx    Rectal cancer Neg Hx    I have reviewed her medical, social, and family history in detail and updated the electronic medical record as necessary.    PHYSICAL EXAMINATION  BP 132/74   Pulse 77   Ht 5\' 3"  (1.6 m)   Wt 158 lb 8 oz (71.9 kg)   SpO2 97%   BMI 28.08 kg/m  Wt Readings from Last 3 Encounters:  10/29/20 158 lb 8 oz (71.9 kg)  10/20/20 158 lb 3.2 oz (71.8 kg)  09/04/20 154 lb (69.9 kg)  GEN: NAD, appears stated age, doesn't appear chronically ill PSYCH: Cooperative, without pressured speech EYE: Conjunctivae pink, sclerae anicteric ENT: MMM CV: Non-tachycardic RESP: No audible wheezing GI: NABS, soft, NT/ND, without rebound or guarding MSK/EXT: No lower extremity edema SKIN: No jaundice NEURO:  Alert & Oriented x 3, no focal deficits   REVIEW OF DATA  I reviewed the following data at the time of this encounter:  GI Procedures and Studies  December 2021 EGD -  No gross lesions in esophagus. Dilated with evidence of mucosal wrent being present. - 5 cm hiatal hernia. - No gross lesions in the stomach. - No gross lesions in the duodenal bulb, in the first portion of the duodenum and in the second portion of the duodenum.  April 2022 SLP evaluation Pt arrives for her first MBS after two esophagrams with finding of dysmotility as well and subsequent treatment for stricture. Pt demonstrates no oral or oropharyngeal dysphagia. However, even with initial sips, prior to significant esophageal filling, pt had immediate coughing, throat clearing and report of feeling irritated. She coughed throughout the study without any laryngeal penetration or aspiration. She did have mild premature spillage not outside of normal limits and was observed to have esophageal stasis with solids (see  radiologist report below). Pt reports many cough triggers beyond swallowing, such as talking, spicy foods etc. She also suspects she is exposed to many allergens. Pt is likely experiencing a hypersensitive cough response during swallowing given triggers including esophageal dysphagia. The act of frequent coughing and throat clearing increases inflammatory responses and can cause more coughing. Offered counsel on esophageal strategies to clear residue but also introduced concepts of awareness of the problem, cough suppression techniques and recognizing environmental and behavioral triggers. Pt may benefit from f/u with OP SLP for therapeutic interventions that will target these techniques further.  Laboratory Studies  Reviewed those in epic and care everywhere  Imaging Studies  April 2022 barium esophagram IMPRESSION: Limited esophagram due to the small amount of material that could be swallowed any given time by the patient. Even slightly larger swallows were followed by some coughing. Signs of marked esophageal dysmotility with stasis in the  esophagus and potential distal esophageal narrowing the setting of hiatal hernia. No signs of gross aspiration or even penetration on limited swallow assessment. However, given the history of CVA and difficulties in the oral phase dedicated swallow assessment may be helpful.   ASSESSMENT  Ms. Mccuistion is a 87 y.o. female with a pmh significant for CVA (on Plavix), atrial fibrillation, anxiety/depression, sleep apnea, colon polyps (unknown pathology), status post appendectomy, seborrheic keratoses (seen dermatology in the past), chronic sore throat, esophagitis, hiatal hernia, diverticulosis, hemorrhoids, colonic AVM, adenomatous colon polyps.  The patient is seen today for evaluation and management of:  1. Pyrosis   2. Dysphagia, unspecified type   3. Abnormal barium swallow   4. Chronic cough    The patient is hemodynamically stable.  Clinically, the patient  seems to be stable at this time with her symptoms of pyrosis.  The patient has a barium swallow that is suggestive of dysmotility.  Her symptoms however are not so significant overall that affecting her food intake or weight.  With this being said if symptoms of dysphagia progress we discussed the role of esophageal manometry to further define if this is IEM or nutcracker esophagus or diffuse esophageal spasms.  She understands the treatment options could be limited even if we do find that she has true dysmotility with manometry but it is something she could conceive taking if issues persist/worsen.  We will going to continue her PPI therapy.  Hopefully she will be doing better after significant chewing after she gets further periodontal work completed.  I will see her back in a few months after her dental work has been completed and hopefully she is she is seen an improvement in regards to her symptoms after she chews more thoroughly.  All patient questions were answered to the  best of my ability, and the patient agrees to the aforementioned plan of action with follow-up as indicated.   PLAN  Continue omeprazole 40 mg twice daily (take 30 minutes before meals) If symptoms of dysphagia and/or extra-esophageal cough symptoms persist will consider pH impedance and manometry   No orders of the defined types were placed in this encounter.   New Prescriptions   No medications on file   Modified Medications   Modified Medication Previous Medication   DIAZEPAM (VALIUM) 5 MG TABLET diazepam (VALIUM) 5 MG tablet      Take 0.5 tablets (2.5 mg total) by mouth as needed (anxiety). WILL NEED APPOINTMENT FOR ADDITIONAL REFILLS    Take 0.5 tablets (2.5 mg total) by mouth as needed (anxiety).   OMEPRAZOLE (PRILOSEC) 40 MG CAPSULE omeprazole (PRILOSEC) 40 MG capsule      Take 1 capsule (40 mg total) by mouth 2 (two) times daily before a meal.    TAKE 1 CAPSULE (40 MG TOTAL) BY MOUTH 2 (TWO) TIMES DAILY BEFORE A  MEAL.    Planned Follow Up Return in about 4 months (around 03/01/2021).   Total Time in Face-to-Face and in Coordination of Care for patient including independent/personal interpretation/review of prior testing, medical history, examination, medication adjustment, communicating results with the patient directly, and documentation with the EHR is 25 minutes.  Minutes.   Darlene Britain, MD [facility] Office # [phone]

## 2020-10-29 NOTE — Telephone Encounter (Signed)
Message left on clinical intake voicemail:  Patient needs a refill on valium. Patient has recently switched back to [facility] from another provider.    No treatment agreement on file and no pending appointment. Notation made on refill that patient will need an appointment for additional refills.

## 2020-10-30 ENCOUNTER — Encounter: Payer: Self-pay | Admitting: Gastroenterology

## 2020-10-30 DIAGNOSIS — R933 Abnormal findings on diagnostic imaging of other parts of digestive tract: Secondary | ICD-10-CM | POA: Insufficient documentation

## 2020-10-30 MED ORDER — DIAZEPAM 5 MG PO TABS: 2.5000 mg | ORAL_TABLET | ORAL | 0 refills | Status: DC | PRN

## 2020-11-04 ENCOUNTER — Other Ambulatory Visit: Payer: Self-pay | Admitting: Internal Medicine

## 2020-11-04 ENCOUNTER — Ambulatory Visit: Payer: Medicare Other | Admitting: Physician Assistant

## 2020-11-04 ENCOUNTER — Other Ambulatory Visit: Payer: Self-pay | Admitting: Nurse Practitioner

## 2020-11-05 ENCOUNTER — Telehealth: Payer: Self-pay | Admitting: Pulmonary Disease

## 2020-11-05 NOTE — Telephone Encounter (Signed)
Patient sleep study faxed to office and in consult papers.  Patient used cpap in the past, but could not tolerate mask.

## 2020-11-06 NOTE — Progress Notes (Signed)
Primary Physician/Referring:  [name], [name], NP  Patient ID: Darlene [name], female    DOB: [birth year], 87 y.o.   MRN: [id number]  Chief Complaint  Patient presents with   Hypertension   hld   Follow-up    6 months   HPI:    Darlene [name]  is a 87 y.o. Caucasian female with hypertension, hyperlipidemia, prediabetes, h/o sleep apnea not on CPAP, prior history of stroke in 2006 without residual defect, on Plavix chronically. She has had frequent palpitations, difficulty in controlling hypertension, extreme anxiety, multiple medication intolerances.  However with the present combination of medications, blood pressure has been well controlled.   Patient was evaluated in the ED 08/29/2020 with complaints of chest pressure as well as left arm pain, work-up at that time was negative.  Patient subsequently underwent coronary CTA which revealed coronary calcium score of 0 and no coronary artery disease.  However coronary CTA did reveal hiatal hernia for which she was advised to follow-up with GI.  Patient now presents for 42-month follow-up.  At last office visit she was stable from a cardiovascular standpoint, therefore no changes were made.  Patient is feeling well from a cardiovascular standpoint.  No recurrence of chest pain.  Denies palpitations, syncope, near syncope, dizziness, orthopnea, PND, leg edema.  She is planning to establish with with our pulmonology for evaluation and management of sleep apnea.  Patient does continue to complain of allergies and difficulty breathing associated with her current living situation.  Patient reports she was previously on Zetia, however she does not recall why she discontinued this.  She is in the process of getting dental work done over the next 1 to 2 weeks, therefore would like to hold off on medication changes until after this is done.  Past Medical History:  Diagnosis Date   Anxiety    Colon polyps    Depression    Hyperlipidemia    Hypertension     Hypothyroidism    Melanoma in situ ([place]) 09/19/2018   Right Arm (excision)   Skin cancer    Sleep apnea    Stroke [facility]) 2008   Past Surgical History:  Procedure Laterality Date   ADENOIDECTOMY     APPENDECTOMY  1960   CATARACT EXTRACTION Left    COLONOSCOPY     GUM SURGERY  03/2019   LUMBAR [place] SURGERY  2014   MELANOMA EXCISION  2020   MESH APPLIED TO LAP PORT     TONSILLECTOMY  1940   Family History  Problem Relation Age of Onset   Lung cancer Mother 73       smoker   Heart disease Mother    Stroke Father 87   Heart attack Father    Allergic rhinitis Sister    Hypertension Sister    CVA Brother    Lung cancer Maternal Grandfather    Heart attack Paternal Grandfather    Melanoma Daughter    Psoriasis Daughter    Rheum arthritis Daughter    Bipolar disorder Daughter    Colon cancer Neg Hx    Esophageal cancer Neg Hx    Inflammatory bowel disease Neg Hx    Liver disease Neg Hx    Pancreatic cancer Neg Hx    Stomach cancer Neg Hx    Rectal cancer Neg Hx     Social History   Tobacco Use   Smoking status: Never   Smokeless tobacco: Never  Substance Use Topics   Alcohol use: Yes  Comment: hardly ever   ROS   Review of Systems  Constitutional: Negative for malaise/fatigue and weight gain.  Cardiovascular:  Negative for chest pain (resolved), claudication, leg swelling, near-syncope, orthopnea, palpitations, paroxysmal nocturnal dyspnea and syncope.  Respiratory:  Negative for shortness of breath.   Musculoskeletal:  Positive for arthritis and joint pain.  Neurological:  Negative for dizziness.  Objective  Blood pressure 133/60, pulse 67, temperature 98.2 F (36.8 C), temperature source Temporal, resp. rate 17, height 5\' 3"  (1.6 m), weight 162 lb 3.2 oz (73.6 kg), SpO2 99 %.  Vitals with BMI 11/07/2020 10/29/2020 10/20/2020  Height 5\' 3"  5\' 3"  5\' 3"   Weight 162 lbs 3 oz 158 lbs 8 oz 158 lbs 3 oz  BMI 28.74 62.13 08.65  Systolic 784 696 295  Diastolic 60  74 78  Pulse 67 77 78  Physical Exam Vitals reviewed.  Constitutional:      Appearance: Normal appearance.  HENT:     Head: Normocephalic and atraumatic.  Cardiovascular:     Rate and Rhythm: Normal rate and regular rhythm.     Pulses: Normal pulses and intact distal pulses.          Radial pulses are 2+ on the right side and 2+ on the left side.     Heart sounds: Normal heart sounds, S1 normal and S2 normal. No murmur heard.   No gallop.     Comments: No JVD. Pulmonary:     Effort: Pulmonary effort is normal. No respiratory distress.     Breath sounds: Normal breath sounds. No wheezing, rhonchi or rales.  Musculoskeletal:        General: Normal range of motion.     Right lower leg: No edema.     Left lower leg: No edema.  Neurological:     Mental Status: She is alert.  Physical exam unchanged compared to previous office visit.  Laboratory examination:   CMP Latest Ref Rng & Units 08/29/2020 02/07/2020 12/26/2019  Glucose 70 - 99 mg/dL 126(H) 115(H) 114(H)  BUN 8 - 23 mg/dL 17 15 14   Creatinine 0.44 - 1.00 mg/dL 0.74 0.78 0.80  Sodium 135 - 145 mmol/L 142 142 142  Potassium 3.5 - 5.1 mmol/L 3.7 3.8 4.3  Chloride 98 - 111 mmol/L 109 109 105  CO2 22 - 32 mmol/L 26 23 31   Calcium 8.9 - 10.3 mg/dL 9.7 9.5 10.0  Total Protein 6.1 - 8.1 g/dL - - 7.0  Total Bilirubin 0.2 - 1.2 mg/dL - - 0.5  Alkaline Phos 38 - 126 U/L - - -  AST 10 - 35 U/L - - 16  ALT 6 - 29 U/L - - 13   CBC Latest Ref Rng & Units 08/29/2020 02/07/2020 12/26/2019  WBC 4.0 - 10.5 K/uL 7.5 7.3 6.5  Hemoglobin 12.0 - 15.0 g/dL 13.2 14.3 14.5  Hematocrit 36.0 - 46.0 % 41.0 41.9 43.3  Platelets 150 - 400 K/uL 253 267 263   Lipid Panel     Component Value Date/Time   CHOL 180 12/26/2019 1053   TRIG 123 12/26/2019 1053   HDL 51 12/26/2019 1053   CHOLHDL 3.5 12/26/2019 1053   LDLCALC 107 (H) 12/26/2019 1053   HEMOGLOBIN A1C Lab Results  Component Value Date   HGBA1C 6.2 (H) 01/08/2020   MPG 131 01/08/2020    TSH No results for input(s): TSH in the last 8760 hours.  High-sensitivity troponin 05/16/2020: <6  Allergies   Allergies  Allergen Reactions   Norco [  Hydrocodone-Acetaminophen] Nausea And Vomiting   Dust Mite Extract    Oxycontin [Oxycodone Hcl] Nausea And Vomiting   Pollen Extract     Medications Prior to Visit:   Outpatient Medications Prior to Visit  Medication Sig Dispense Refill   acetaminophen (TYLENOL) 500 MG tablet Take 25-500 mg by mouth every 6 (six) hours as needed for mild pain or headache.     azelastine (ASTELIN) 0.1 % nasal spray Place 1 to 2 sprays each nostril 1-2 times a day as needed for runny nose/drainage down throat (Patient taking differently: Place 1-2 sprays into both nostrils at bedtime as needed for rhinitis (drainage down throat).) 30 mL 5   clopidogrel (PLAVIX) 75 MG tablet TAKE 1 TABLET BY MOUTH EVERY DAY 90 tablet 1   cycloSPORINE (RESTASIS) 0.05 % ophthalmic emulsion Place 1 drop into both eyes 2 (two) times daily.     diazepam (VALIUM) 5 MG tablet Take 0.5 tablets (2.5 mg total) by mouth as needed (anxiety). WILL NEED APPOINTMENT FOR ADDITIONAL REFILLS 15 tablet 0   fluticasone (FLONASE) 50 MCG/ACT nasal spray Place 1-2 sprays into both nostrils daily. (Patient taking differently: Place 1-2 sprays into both nostrils in the morning and at bedtime.) 16 g 5   GEMTESA 75 MG TABS Take 75 mg by mouth daily.     ketoconazole (NIZORAL) 2 % shampoo Apply 1 application topically See admin instructions. Twice weekly as needed for scalp irritation. 120 mL 1   losartan (COZAAR) 50 MG tablet Take 1 tablet (50 mg total) by mouth daily. Appointment due prior to additional refills 90 tablet 1   nitroGLYCERIN (NITROSTAT) 0.4 MG SL tablet Place 1 tablet (0.4 mg total) under the tongue every 5 (five) minutes as needed for up to 25 days for chest pain. 25 tablet 3   Olopatadine HCl (PATADAY OP) Place 1 drop into both eyes daily.     omeprazole (PRILOSEC) 40 MG capsule  Take 1 capsule (40 mg total) by mouth 2 (two) times daily before a meal. 180 capsule 4   pravastatin (PRAVACHOL) 40 MG tablet Take 1 tablet (40 mg total) by mouth daily. TAKE 1 TABLET BY MOUTH EVERYDAY AT BEDTIME 90 tablet 2   sodium chloride (OCEAN) 0.65 % SOLN nasal spray Place 1 spray into both nostrils as needed for congestion.     SYNTHROID 100 MCG tablet TAKE 1 TABLET BY MOUTH EVERY DAY BEFORE BREAKFAST 90 tablet 1   Zolpidem Tartrate 3.5 MG SUBL TAKE 1 TABLET BY MOUTH AT BEDTIME AS NEEDED. 30 tablet 0   No facility-administered medications prior to visit.   Final Medications at End of Visit    Current Meds  Medication Sig   acetaminophen (TYLENOL) 500 MG tablet Take 25-500 mg by mouth every 6 (six) hours as needed for mild pain or headache.   azelastine (ASTELIN) 0.1 % nasal spray Place 1 to 2 sprays each nostril 1-2 times a day as needed for runny nose/drainage down throat (Patient taking differently: Place 1-2 sprays into both nostrils at bedtime as needed for rhinitis (drainage down throat).)   clopidogrel (PLAVIX) 75 MG tablet TAKE 1 TABLET BY MOUTH EVERY DAY   cycloSPORINE (RESTASIS) 0.05 % ophthalmic emulsion Place 1 drop into both eyes 2 (two) times daily.   diazepam (VALIUM) 5 MG tablet Take 0.5 tablets (2.5 mg total) by mouth as needed (anxiety). WILL NEED APPOINTMENT FOR ADDITIONAL REFILLS   ezetimibe (ZETIA) 10 MG tablet Take 1 tablet (10 mg total) by mouth daily.  fluticasone (FLONASE) 50 MCG/ACT nasal spray Place 1-2 sprays into both nostrils daily. (Patient taking differently: Place 1-2 sprays into both nostrils in the morning and at bedtime.)   GEMTESA 75 MG TABS Take 75 mg by mouth daily.   ketoconazole (NIZORAL) 2 % shampoo Apply 1 application topically See admin instructions. Twice weekly as needed for scalp irritation.   losartan (COZAAR) 50 MG tablet Take 1 tablet (50 mg total) by mouth daily. Appointment due prior to additional refills   nitroGLYCERIN (NITROSTAT)  0.4 MG SL tablet Place 1 tablet (0.4 mg total) under the tongue every 5 (five) minutes as needed for up to 25 days for chest pain.   Olopatadine HCl (PATADAY OP) Place 1 drop into both eyes daily.   omeprazole (PRILOSEC) 40 MG capsule Take 1 capsule (40 mg total) by mouth 2 (two) times daily before a meal.   pravastatin (PRAVACHOL) 40 MG tablet Take 1 tablet (40 mg total) by mouth daily. TAKE 1 TABLET BY MOUTH EVERYDAY AT BEDTIME   sodium chloride (OCEAN) 0.65 % SOLN nasal spray Place 1 spray into both nostrils as needed for congestion.   SYNTHROID 100 MCG tablet TAKE 1 TABLET BY MOUTH EVERY DAY BEFORE BREAKFAST   Zolpidem Tartrate 3.5 MG SUBL TAKE 1 TABLET BY MOUTH AT BEDTIME AS NEEDED.   Radiology:   CXR 03/11/2019: Cardiac shadow is within normal limits. Aortic calcifications are seen. The lungs are clear. No bony abnormality is noted.  MRI brain 09/20/2019: Normal.  CT angiogram head 02/08/2020: 1. No emergent finding or specific explanation for headache. 2. Remote right occipital infarction.  MR cervical spine 02/26/2020: 1. No acute osseous abnormality in the cervical spine. 2. Mild multifactorial degenerative spinal stenosis C4-C5 through C6-C7. Very mild cord mass effect but no cord signal abnormality. 3. Associated severe degenerative foraminal stenosis at the bilateral C5 nerve levels.  Up to moderate left C6 and left C7 nerve level foraminal stenosis  Cardiac Studies:   Stress Echocardiogram 06-08-2017:  Resting EKG demonstrated normal sinus rhythm. Stress EKG is negative for myocardial ischemia, patient exercised for 4:29 minutes, achieved 6.2 Mets. Normal blood pressure response. No evidence of ischemia. Stress echocardiogram: Normal LV systolic function, no stress induced wall motion abnormality. EF 75-80%.  Nocturnal oximetry 04/18/2019: SPO2 88% for 6 minutes SPO2 less than 89% 11 minutes. Oxygen desaturation events 03/26/1940.  Desaturation index 42.  Minimum heart  rate 54, maximum heart rate 89 bpm, average heart rate 60 bpm.  Percentage in bradycardia 66%.  Highest SPO2 99%, lowest SPO2 84%. Impression: Patient qualifies for nocturnal oxygen supplementation per Medicare guidelines Group 1.  Remote outpatient cardiac telemetry 04/24/2019 through 05/08/2019: Predominant rhythm is normal sinus rhythm.  Minimum heart rate 49, average heart rate 72 and maximum heart rate 195 bpm. PVC burden <1%.  There were 0 ventricular couplets and 0 ventricular runs. PAC burden <1%.  There was 1 supraventricular run of 5 beats.  No atrial fibrillation.  No heart block. 12 patient activated events correlated with sinus rhythm.  PCV MYOCARDIAL PERFUSION WO LEXISCAN 04/14/2020 Normal ECG stress. The patient exercised for 5 minutes and 0 seconds of a Bruce protocol, achieving approximately 7.03 METs.  Normal BP response. Reduced exercise tolerance. Non-limiting chest pain with exercise. Myocardial perfusion is normal. Overall LV systolic function is normal without regional wall motion abnormalities. Stress LV EF: 63%. No previous exam available for comparison. Low risk.  PCV ECHOCARDIOGRAM COMPLETE [id number]/41/9379 Normal LV systolic function with visual EF 60-65%. Left ventricle cavity  is normal in size. Normal global wall motion. Normal diastolic filling pattern, normal LAP. Mild tricuspid regurgitation. Compared to prior study dated 04/24/2019: no significant change.  Coronary CTA 09/01/2020: 1. No acute findings in the imaged extracardiac chest. 2. Small to moderate hiatal hernia. 3. Aortic Atherosclerosis (ICD10-I70.0).   1. Total coronary calcium score of 0 AU.  2. Normal coronary origin with left dominance.  3. Aortic atherosclerosis.  4. CAD-RADS 0: No evidence of epicardial coronary artery disease (0%). RECOMMENDATIONS: Consider non-atherosclerotic causes of chest pain.  EKG  09/04/2020: Sinus rhythm at a rate of 71 bpm.  Normal axis.  Incomplete right bundle  branch block.  Nonspecific T wave abnormality.  Compared to EKG 04/04/2020, no significant change.  EKG 02/07/2020: Normal sinus rhythm at rate of 74 bpm, normal axis, poor R wave progression, probably normal variant.  Low-voltage.  EKG 04/12/2019: Normal sinus rhythm with rate of 75 bpm, left atrial enlargement, normal axis.  Nonspecific anterolateral T wave normality, cannot exclude ischemia.  Compared to 12/21/2017, no significant change.  Assessment     ICD-10-CM   1. Essential hypertension  I10     2. Pure hypercholesterolemia  E78.00 Lipid Panel With LDL/HDL Ratio    Lipid Panel With LDL/HDL Ratio      Meds ordered this encounter  Medications   ezetimibe (ZETIA) 10 MG tablet    Sig: Take 1 tablet (10 mg total) by mouth daily.    Dispense:  90 tablet    Refill:  3    There are no discontinued medications.   Orders Placed This Encounter  Procedures   Lipid Panel With LDL/HDL Ratio    Standing Status:   Future    Number of Occurrences:   1    Standing Expiration Date:   05/08/2021    Recommendations:   Leesa Leifheit  is a  87 y.o. Caucasian female with hypertension, hyperlipidemia, prediabetes, h/o sleep apnea not on CPAP, prior history of stroke in 2006 without residual defect, on Plavix chronically. She has had frequent palpitations, difficulty in controlling hypertension, extreme anxiety, multiple medication intolerances.  However with the present combination of medications, blood pressure has been well controlled.   Patient was evaluated in the ED 08/29/2020 with complaints of chest pressure as well as left arm pain, work-up at that time was negative.  Patient subsequently underwent coronary CTA which revealed coronary calcium score of 0 and no coronary artery disease.  However coronary CTA did reveal hiatal hernia for which she was advised to follow-up with GI.  Patient now presents for 32-month follow-up.  At last office visit she was stable from a cardiovascular standpoint,  therefore no changes were made.  Patient has had no recurrence of chest pain since last office visit.  From a cardiac standpoint she is doing well.  Physical exam is stable compared to previous office visit.  Pressure is well controlled.  Patient is not presently taking Zetia, however review of previous lipid panel shows LDL remains uncontrolled.  Patient will wait until she is finished her currently scheduled dental work, she will then start Zetia 10 mg p.o. daily at the beginning of November.  We will repeat lipid profile testing 3 months after that.  Patient is otherwise stable from a cardiac standpoint. Follow up in 3 months, sooner if needed, for HTN, HLD.    Alethia Berthold, PA-C 11/07/2020, 12:15 PM Office: [phone]

## 2020-11-07 ENCOUNTER — Ambulatory Visit: Payer: Medicare Other | Admitting: Student

## 2020-11-07 ENCOUNTER — Encounter: Payer: Self-pay | Admitting: Student

## 2020-11-07 ENCOUNTER — Other Ambulatory Visit: Payer: Self-pay

## 2020-11-07 VITALS — BP 133/60 | HR 67 | Temp 98.2°F | Resp 17 | Ht 63.0 in | Wt 162.2 lb

## 2020-11-07 DIAGNOSIS — I10 Essential (primary) hypertension: Secondary | ICD-10-CM

## 2020-11-07 DIAGNOSIS — E7800 Pure hypercholesterolemia, unspecified: Secondary | ICD-10-CM

## 2020-11-07 MED ORDER — EZETIMIBE 10 MG PO TABS: 10.0000 mg | ORAL_TABLET | Freq: Every day | ORAL | 3 refills | Status: DC

## 2020-11-11 ENCOUNTER — Encounter: Payer: Self-pay | Admitting: Physician Assistant

## 2020-11-11 ENCOUNTER — Other Ambulatory Visit: Payer: Self-pay

## 2020-11-11 ENCOUNTER — Ambulatory Visit: Payer: Medicare Other | Admitting: Physician Assistant

## 2020-11-11 DIAGNOSIS — Z1283 Encounter for screening for malignant neoplasm of skin: Secondary | ICD-10-CM | POA: Diagnosis not present

## 2020-11-11 DIAGNOSIS — L570 Actinic keratosis: Secondary | ICD-10-CM | POA: Diagnosis not present

## 2020-11-11 DIAGNOSIS — D2272 Melanocytic nevi of left lower limb, including hip: Secondary | ICD-10-CM | POA: Diagnosis not present

## 2020-11-11 DIAGNOSIS — L820 Inflamed seborrheic keratosis: Secondary | ICD-10-CM | POA: Diagnosis not present

## 2020-11-11 DIAGNOSIS — Z85820 Personal history of malignant melanoma of skin: Secondary | ICD-10-CM | POA: Diagnosis not present

## 2020-11-11 DIAGNOSIS — D229 Melanocytic nevi, unspecified: Secondary | ICD-10-CM

## 2020-11-11 DIAGNOSIS — D485 Neoplasm of uncertain behavior of skin: Secondary | ICD-10-CM

## 2020-11-11 HISTORY — DX: Melanocytic nevi, unspecified: D22.9

## 2020-11-11 NOTE — Patient Instructions (Signed)

## 2020-11-12 ENCOUNTER — Ambulatory Visit (INDEPENDENT_AMBULATORY_CARE_PROVIDER_SITE_OTHER): Payer: Medicare Other | Admitting: Pulmonary Disease

## 2020-11-12 ENCOUNTER — Encounter: Payer: Self-pay | Admitting: Pulmonary Disease

## 2020-11-12 ENCOUNTER — Encounter: Payer: Self-pay | Admitting: Physician Assistant

## 2020-11-12 VITALS — BP 128/82 | HR 78 | Temp 97.8°F | Ht 63.0 in | Wt 160.0 lb

## 2020-11-12 DIAGNOSIS — G4733 Obstructive sleep apnea (adult) (pediatric): Secondary | ICD-10-CM | POA: Diagnosis not present

## 2020-11-12 DIAGNOSIS — F5101 Primary insomnia: Secondary | ICD-10-CM

## 2020-11-12 DIAGNOSIS — R0602 Shortness of breath: Secondary | ICD-10-CM | POA: Diagnosis not present

## 2020-11-12 NOTE — Patient Instructions (Signed)
I will see her in about 6 to 8 weeks I will see you back in about 6 to 8 weeks  Order an in lab polysomnogram  Order pulmonary function test  Start using albuterol Look up information about how to use your inhaler  If not able to use inhaler properly, you may need a spacer device  Call us with any significant concerns

## 2020-11-12 NOTE — Progress Notes (Signed)
   Follow-Up Visit   Subjective  Darlene [name] is a 87 y.o. female who presents for the following: Follow-up (Follow up on the isk ). Crust on her back is growing taller and it catches on her clothing. She is worried about it as she had mm diagnosis.    The following portions of the chart were reviewed this encounter and updated as appropriate:  Tobacco  Allergies  Meds  Problems  Med Hx  Surg Hx  Fam Hx      Objective  Well appearing patient in no apparent distress; mood and affect are within normal limits.  A full examination was performed including scalp, head, eyes, ears, nose, lips, neck, chest, axillae, abdomen, back, buttocks, bilateral upper extremities, bilateral lower extremities, hands, feet, fingers, toes, fingernails, and toenails. All findings within normal limits unless otherwise noted below.  No lymphadenopathy noted.   Left Upper Back Dark, bichromic thick crust.       Left Inner Knee Bichromic dark nested macule.        Left Malar Cheek, Right Malar Cheek Erythematous patches with gritty scale.        Assessment & Plan  Neoplasm of uncertain behavior of skin (2) Left Upper Back  Skin / nail biopsy Type of biopsy: tangential   Informed consent: discussed and consent obtained   Timeout: patient name, date of birth, surgical site, and procedure verified   Procedure prep:  Patient was prepped and draped in usual sterile fashion (Non sterile) Prep type:  Chlorhexidine Anesthesia: the lesion was anesthetized in a standard fashion   Anesthetic:  1% lidocaine w/ epinephrine 1-100,000 local infiltration Instrument used: flexible razor blade   Hemostasis achieved with: aluminum chloride   Outcome: patient tolerated procedure well   Post-procedure details: sterile dressing applied and wound care instructions given   Dressing type: bandage and petrolatum    Specimen 1 - Surgical pathology Differential Diagnosis: R/O ISK vs SCC  Check  Margins: No  Left Inner Knee  Skin / nail biopsy Type of biopsy: tangential   Informed consent: discussed and consent obtained   Timeout: patient name, date of birth, surgical site, and procedure verified   Procedure prep:  Patient was prepped and draped in usual sterile fashion (Non sterile) Prep type:  Chlorhexidine Anesthesia: the lesion was anesthetized in a standard fashion   Anesthetic:  1% lidocaine w/ epinephrine 1-100,000 local infiltration Instrument used: flexible razor blade   Hemostasis achieved with: aluminum chloride   Outcome: patient tolerated procedure well   Post-procedure details: sterile dressing applied and wound care instructions given   Dressing type: bandage and petrolatum    Specimen 2 - Surgical pathology Differential Diagnosis: R/O Atypia  Check Margins: No  AK (actinic keratosis) (2) Left Malar Cheek; Right Malar Cheek  Destruction of lesion - Left Malar Cheek, Right Malar Cheek Complexity: simple   Destruction method: cryotherapy   Informed consent: discussed and consent obtained   Timeout:  patient name, date of birth, surgical site, and procedure verified Lesion destroyed using liquid nitrogen: Yes   Cryotherapy cycles:  1 Outcome: patient tolerated procedure well with no complications   Post-procedure details: wound care instructions given      I, Ervin Rothbauer, PA-C, have reviewed all documentation's for this visit.  The documentation on 11/12/20 for the exam, diagnosis, procedures and orders are all accurate and complete.

## 2020-11-12 NOTE — Progress Notes (Signed)
Darlene [name]    [id number]    1933-08-31  Primary Care Physician:[name], [name], NP  Referring Physician: [name], [name], NP [address],  [place] [address]  Chief complaint:   Patient is being seen for concern for obstructive sleep apnea and shortness of breath  HPI:  Diagnosed with obstructive sleep apnea about 10 years ago-refused to use CPAP at the time She had a second study done about 5 years ago was also positive for sleep apnea and at that time also refused to use CPAP as she was taking care of the spouse with dementia Was also having some difficulty with incontinence  Shortness of breath with activity Shortness of breath she states is worse at night She does have wheezing Has nasal stuffiness and congestion  She states that when she travels out of [place], example when she goes back to [place]-her breathing does feel better She followed up with ENT  She clearly states today that even if she is diagnosed with sleep apnea she is unlikely to want to use CPAP Just will not work for her Other options of treatment for sleep disordered breathing was discussed  She recently was prescribed albuterol which she has not started to use, stated she did not know how to use it -I did demonstrate how to use the inhaler and teachings provided  Usually tries to go to bed between 11 PM and finally gets out of bed about 7 AM falls asleep immediately 2-4 awakenings Finally out of bed about 7:45 AM Weight has been relatively stable  She does use zolpidem to help her sleep  Multiple awakenings for her incontinence  Never smoker  She does have a lot of allergies She does use a Nettie pot at night and also Astelin  Outpatient Encounter Medications as of 11/12/2020  Medication Sig   acetaminophen (TYLENOL) 500 MG tablet Take 25-500 mg by mouth every 6 (six) hours as needed for mild pain or headache.   amoxicillin (AMOXIL) 500 MG capsule Take 500 mg by mouth 3  (three) times daily.   azelastine (ASTELIN) 0.1 % nasal spray Place 1 to 2 sprays each nostril 1-2 times a day as needed for runny nose/drainage down throat (Patient taking differently: Place 1-2 sprays into both nostrils at bedtime as needed for rhinitis (drainage down throat).)   clopidogrel (PLAVIX) 75 MG tablet TAKE 1 TABLET BY MOUTH EVERY DAY   cycloSPORINE (RESTASIS) 0.05 % ophthalmic emulsion Place 1 drop into both eyes 2 (two) times daily.   diazepam (VALIUM) 5 MG tablet Take 0.5 tablets (2.5 mg total) by mouth as needed (anxiety). WILL NEED APPOINTMENT FOR ADDITIONAL REFILLS   ezetimibe (ZETIA) 10 MG tablet Take 1 tablet (10 mg total) by mouth daily.   fluticasone (FLONASE) 50 MCG/ACT nasal spray Place 1-2 sprays into both nostrils daily. (Patient taking differently: Place 1-2 sprays into both nostrils in the morning and at bedtime.)   GEMTESA 75 MG TABS Take 75 mg by mouth daily.   ketoconazole (NIZORAL) 2 % shampoo Apply 1 application topically See admin instructions. Twice weekly as needed for scalp irritation.   losartan (COZAAR) 50 MG tablet Take 1 tablet (50 mg total) by mouth daily. Appointment due prior to additional refills   nitroGLYCERIN (NITROSTAT) 0.4 MG SL tablet Place 1 tablet (0.4 mg total) under the tongue every 5 (five) minutes as needed for up to 25 days for chest pain.   Olopatadine HCl (PATADAY OP)  Place 1 drop into both eyes daily.   omeprazole (PRILOSEC) 40 MG capsule Take 1 capsule (40 mg total) by mouth 2 (two) times daily before a meal.   pravastatin (PRAVACHOL) 40 MG tablet Take 1 tablet (40 mg total) by mouth daily. TAKE 1 TABLET BY MOUTH EVERYDAY AT BEDTIME   sodium chloride (OCEAN) 0.65 % SOLN nasal spray Place 1 spray into both nostrils as needed for congestion.   SYNTHROID 100 MCG tablet TAKE 1 TABLET BY MOUTH EVERY DAY BEFORE BREAKFAST   Zolpidem Tartrate 3.5 MG SUBL TAKE 1 TABLET BY MOUTH AT BEDTIME AS NEEDED.   No facility-administered encounter  medications on file as of 11/12/2020.    Allergies as of 11/12/2020 - Review Complete 11/12/2020  Allergen Reaction Noted   Norco [hydrocodone-acetaminophen] Nausea And Vomiting 05/17/2019   Dust mite extract Other (See Comments) 09/06/2017   Oxycontin [oxycodone hcl] Nausea And Vomiting 09/06/2017   Pollen extract Other (See Comments) 09/06/2017    Past Medical History:  Diagnosis Date   Anxiety    Colon polyps    Depression    Hyperlipidemia    Hypertension    Hypothyroidism    Melanoma in situ ([place]) 09/19/2018   Right Arm (excision)   Skin cancer    Sleep apnea    Stroke ([place]) 2008    Past Surgical History:  Procedure Laterality Date   ADENOIDECTOMY     APPENDECTOMY  1960   CATARACT EXTRACTION Left    COLONOSCOPY     GUM SURGERY  03/2019   LUMBAR [place] SURGERY  2014   MELANOMA EXCISION  2020   MESH APPLIED TO LAP PORT     TONSILLECTOMY  1940    Family History  Problem Relation Age of Onset   Lung cancer Mother 28       smoker   Heart disease Mother    Stroke Father 65   Heart attack Father    Allergic rhinitis Sister    Hypertension Sister    CVA Brother    Lung cancer Maternal Grandfather    Heart attack Paternal Grandfather    Melanoma Daughter    Psoriasis Daughter    Rheum arthritis Daughter    Bipolar disorder Daughter    Colon cancer Neg Hx    Esophageal cancer Neg Hx    Inflammatory bowel disease Neg Hx    Liver disease Neg Hx    Pancreatic cancer Neg Hx    Stomach cancer Neg Hx    Rectal cancer Neg Hx     Social History   Socioeconomic History   Marital status: Widowed    Spouse name: Not on file   Number of children: 3   Years of education: Not on file   Highest education level: Not on file  Occupational History   Occupation: retired Radiographer, therapeutic  Tobacco Use   Smoking status: Never   Smokeless tobacco: Never  Vaping Use   Vaping Use: Never used  Substance and Sexual Activity   Alcohol use: Yes    Comment:  hardly ever   Drug use: Never   Sexual activity: Not on file  Other Topics Concern   Not on file  Social History Narrative   Social History      Diet? ok      Do you drink/eat things with caffeine? Weak coffee, (much milk)      Marital status?          divorced  What year were you married? 1961      Do you live in a house, apartment, assisted living, condo, trailer, etc.? apartment      Is it one or more stories? 4      How many persons live in your home? Only me      Do you have any pets in your home? (please list) no      Highest level of education completed? 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             yes                         Type & how often? Walk- would love to return to water exercise      Advanced Directives      Do you have a living will? yes      Do you have a DNR form?           no                       If not, do you want to discuss one? no      Do you have signed POA/HPOA for forms? no      Functional Status      Do you have difficulty bathing or dressing yourself?  no      Do you have difficulty preparing food or eating? no      Do you have difficulty managing your medications? no      Do you have difficulty managing your finances?  no      Do you have difficulty affording your medications? no      Social Determinants of Health   Financial Resource Strain: Not on file  Food Insecurity: Not on file  Transportation Needs: Not on file  Physical Activity: Not on file  Stress: Not on file  Social Connections: Not on file  Intimate Partner Violence: Not on file    Review of Systems  Constitutional:  Positive for fatigue.  Respiratory:  Positive for apnea, shortness of breath and wheezing.   Psychiatric/Behavioral:  Positive for sleep disturbance.    There were no vitals filed for this visit.   Physical Exam Constitutional:       Appearance: Normal appearance.  HENT:     Head: Normocephalic.     Right Ear: Tympanic membrane normal.     Nose: Nose normal.     Mouth/Throat:     Mouth: Mucous membranes are moist.     Comments: Small oral vestibule, crowded oropharynx Eyes:     Pupils: Pupils are equal, round, and reactive to light.  Cardiovascular:     Rate and Rhythm: Normal rate and regular rhythm.     Heart sounds: No murmur heard.   No friction rub.  Pulmonary:     Effort: No respiratory distress.     Breath sounds: No stridor. No wheezing or rhonchi.  Musculoskeletal:     Cervical back: No rigidity or tenderness.  Neurological:     Mental Status: She is alert.  Psychiatric:        Mood and Affect: Mood normal.     Data Reviewed: Patient had spirometry performed x2-no obstructive disease noted, bronchodilator response was not done  Overnight oximetry did show 6 minutes of desaturations  Previous sleep study  is not available  Assessment:  Shortness of breath, wheezing -Multiple allergies  History of obstructive sleep apnea  Nonrestorative sleep  Nonconsolidated sleep  She is reluctant to start CPAP therapy if found to have significant obstructive sleep apnea  Pathophysiology of sleep disordered breathing discussed with the patient  Treatment options discussed with the patient  Inhaler technique was reviewed and teaching was performed -I did encourage her to also look for more information about how to use her inhaler -The option of using a spacer device also discussed  Plan/Recommendations: Schedule patient for an in lab polysomnogram  Schedule for full PFT with bronchodilator response  Encouraged to continue using albuterol for symptom management  Encouraged to continue using nasal sprays and nasal rinses  Avoid known triggers for breathing problems  I will see her back in the office in about 6 to 8 weeks with a pulmonary function test May need steroid inhaler  We may have  difficulty with trying to treat her sleep disordered breathing if found to be significant   Sherrilyn Rist MD [facility] 11/12/2020, 3:13 PM  CC: [name], [name], NP

## 2020-11-13 ENCOUNTER — Encounter: Payer: Self-pay | Admitting: Pulmonary Disease

## 2020-11-13 ENCOUNTER — Telehealth: Payer: Self-pay

## 2020-11-13 NOTE — Telephone Encounter (Signed)
-----   Message from Warren Danes, [place] sent at 11/12/2020  5:50 PM EDT ----- Free margins. Recheck in 6 months

## 2020-11-13 NOTE — Telephone Encounter (Signed)
Phone call to patient with her pathology results. Patient aware of results.  

## 2020-11-26 ENCOUNTER — Other Ambulatory Visit: Payer: Self-pay

## 2020-11-26 ENCOUNTER — Ambulatory Visit (HOSPITAL_BASED_OUTPATIENT_CLINIC_OR_DEPARTMENT_OTHER): Payer: Medicare Other | Attending: Pulmonary Disease | Admitting: Pulmonary Disease

## 2020-11-26 DIAGNOSIS — F5101 Primary insomnia: Secondary | ICD-10-CM

## 2020-11-26 DIAGNOSIS — G4736 Sleep related hypoventilation in conditions classified elsewhere: Secondary | ICD-10-CM | POA: Insufficient documentation

## 2020-11-26 DIAGNOSIS — Z79899 Other long term (current) drug therapy: Secondary | ICD-10-CM | POA: Insufficient documentation

## 2020-11-26 DIAGNOSIS — G4733 Obstructive sleep apnea (adult) (pediatric): Secondary | ICD-10-CM | POA: Insufficient documentation

## 2020-11-27 ENCOUNTER — Encounter: Payer: Self-pay | Admitting: Nurse Practitioner

## 2020-11-27 ENCOUNTER — Non-Acute Institutional Stay: Payer: Medicare Other | Admitting: Nurse Practitioner

## 2020-11-27 VITALS — BP 126/72 | HR 68 | Temp 97.2°F | Ht 63.0 in | Wt 161.0 lb

## 2020-11-27 DIAGNOSIS — Z780 Asymptomatic menopausal state: Secondary | ICD-10-CM

## 2020-11-27 DIAGNOSIS — E782 Mixed hyperlipidemia: Secondary | ICD-10-CM

## 2020-11-27 DIAGNOSIS — I10 Essential (primary) hypertension: Secondary | ICD-10-CM

## 2020-11-27 DIAGNOSIS — M4722 Other spondylosis with radiculopathy, cervical region: Secondary | ICD-10-CM

## 2020-11-27 DIAGNOSIS — R32 Unspecified urinary incontinence: Secondary | ICD-10-CM

## 2020-11-27 DIAGNOSIS — F5101 Primary insomnia: Secondary | ICD-10-CM

## 2020-11-27 DIAGNOSIS — Z8673 Personal history of transient ischemic attack (TIA), and cerebral infarction without residual deficits: Secondary | ICD-10-CM | POA: Diagnosis not present

## 2020-11-27 DIAGNOSIS — E039 Hypothyroidism, unspecified: Secondary | ICD-10-CM

## 2020-11-27 DIAGNOSIS — K219 Gastro-esophageal reflux disease without esophagitis: Secondary | ICD-10-CM

## 2020-11-27 DIAGNOSIS — J329 Chronic sinusitis, unspecified: Secondary | ICD-10-CM | POA: Diagnosis not present

## 2020-11-27 DIAGNOSIS — R1319 Other dysphagia: Secondary | ICD-10-CM

## 2020-11-27 DIAGNOSIS — G4733 Obstructive sleep apnea (adult) (pediatric): Secondary | ICD-10-CM

## 2020-11-27 NOTE — Assessment & Plan Note (Signed)
MR 02/26/20, C6-C7, saw Ortho

## 2020-11-27 NOTE — Assessment & Plan Note (Addendum)
takes Pravastatin, Zetia, LDL 107 12/26/19, update lipid panel.

## 2020-11-27 NOTE — Assessment & Plan Note (Addendum)
Takes Vibegron, seems better

## 2020-11-27 NOTE — Assessment & Plan Note (Signed)
stable Omeprazole, saw GI, EGD 12/26/19

## 2020-11-27 NOTE — Assessment & Plan Note (Addendum)
Levothyroxine, TSH no recent test. Update TSH

## 2020-11-27 NOTE — Assessment & Plan Note (Signed)
Swallow studay 05/12/20, worked with [facility]

## 2020-11-27 NOTE — Assessment & Plan Note (Signed)
DEXA, Vit D, Vit B12

## 2020-11-27 NOTE — Assessment & Plan Note (Signed)
Hx of chronic rhinitis, SOB, wheezing,  on Astelin, Flonase, Bronchodilator, underwent Pulmonology, ENT, Allergy evaluation, had spirometry 10/20/20, Echo 60-65% EF 04/16/20, CTA 09/01/20

## 2020-11-27 NOTE — Progress Notes (Signed)
Location:   clinic [facility]   Place of Service:  Clinic (12) Provider: Marlana Latus NP  Code Status: DNR Goals of Care: [place] 11/27/2020  Does Patient Have a Medical Advance Directive? Yes  Type of Advance Directive [place]  Does patient want to make changes to medical advance directive? No - Patient declined  Copy of [place] in Chart? Yes - validated most recent copy scanned in chart (See row information)  Would patient like information on creating a medical advance directive? -     Chief Complaint  Patient presents with   Medical Management of Chronic Issues    8 month follow-up. Discuss need for DEXA, Tdap, and shingrix or postpone/exclude if patient refused. Discuss why patient is not taking zetia     HPI: Patient is a 87 y.o. female seen today for medical management of chronic diseases.     Hx of chronic rhinitis, SOB, wheezing,  on Astelin, Flonase, Bronchodilator, underwent Pulmonology, ENT, Allergy evaluation, had spirometry 10/20/20, Echo 60-65% EF 04/16/20, CTA 09/01/20        Insomnia Ambien, Valium             Hx of CVA, on plavix. Statin,  residual of  poor left peripheral vision  Hypothyroidism, on Levothyroxine, TSH no recent test.              HTN, blood pressure is controlled on Losartan qd. Followed by Cardiology. CTA negative 09/01/20. Bun/creat 17/0.74 08/29/20            GERD, stable Omeprazole, saw GI, EGD 12/26/19  Esophageal dysmotility, Swallow studay 05/12/20, worked with ST  OSA sleep study, saw Pulmonology.   OA, saw Ortho.   Hyperlipidemia, takes Pravastatin, LDL 107 12/26/19  Cervical spinal stenosis, MR 02/26/20, C6-C7  Urinary frequency/leakage, seems better on Vibegron.        Past Medical History:  Diagnosis Date   Anxiety    Atypical mole 11/11/2020   Left Inner Knee (moderate-free)   Colon polyps    Depression    Hyperlipidemia    Hypertension    Hypothyroidism    Melanoma in situ ([place])  09/19/2018   Right Arm (excision)   Skin cancer    Sleep apnea    Stroke [facility]) 2008    Past Surgical History:  Procedure Laterality Date   ADENOIDECTOMY     APPENDECTOMY  1960   CATARACT EXTRACTION Left    COLONOSCOPY     GUM SURGERY  03/2019   LUMBAR [place] SURGERY  2014   MELANOMA EXCISION  2020   MESH APPLIED TO LAP PORT     TONSILLECTOMY  1940    Allergies  Allergen Reactions   Norco [Hydrocodone-Acetaminophen] Nausea And Vomiting   Dust Mite Extract Other (See Comments)   Oxycontin [Oxycodone Hcl] Nausea And Vomiting   Pollen Extract Other (See Comments)    Allergies as of 11/27/2020       Reactions   Norco [hydrocodone-acetaminophen] Nausea And Vomiting   Dust Mite Extract Other (See Comments)   Oxycontin [oxycodone Hcl] Nausea And Vomiting   Pollen Extract Other (See Comments)        Medication List        Accurate as of November 27, 2020 11:59 PM. If you have any questions, ask your nurse or doctor.          STOP taking these medications    amoxicillin 500 MG capsule Commonly known as: AMOXIL Stopped  by: Darol Cush X Haleem Hanner, NP       TAKE these medications    acetaminophen 500 MG tablet Commonly known as: TYLENOL Take 25-500 mg by mouth every 6 (six) hours as needed for mild pain or headache.   azelastine 0.1 % nasal spray Commonly known as: ASTELIN Place 1 to 2 sprays each nostril 1-2 times a day as needed for runny nose/drainage down throat   clopidogrel 75 MG tablet Commonly known as: PLAVIX TAKE 1 TABLET BY MOUTH EVERY DAY   cycloSPORINE 0.05 % ophthalmic emulsion Commonly known as: RESTASIS Place 1 drop into both eyes 2 (two) times daily.   diazepam 5 MG tablet Commonly known as: VALIUM Take 0.5 tablets (2.5 mg total) by mouth as needed (anxiety). WILL NEED APPOINTMENT FOR ADDITIONAL REFILLS   ezetimibe 10 MG tablet Commonly known as: ZETIA Take 1 tablet (10 mg total) by mouth daily.   fluticasone 50 MCG/ACT nasal spray Commonly  known as: FLONASE Place 1-2 sprays into both nostrils daily.   Gemtesa 75 MG Tabs Generic drug: Vibegron Take 75 mg by mouth daily.   ketoconazole 2 % shampoo Commonly known as: NIZORAL Apply 1 application topically See admin instructions. Twice weekly as needed for scalp irritation.   losartan 50 MG tablet Commonly known as: COZAAR Take 1 tablet (50 mg total) by mouth daily. Appointment due prior to additional refills   nitroGLYCERIN 0.4 MG SL tablet Commonly known as: NITROSTAT Place 1 tablet (0.4 mg total) under the tongue every 5 (five) minutes as needed for up to 25 days for chest pain.   omeprazole 40 MG capsule Commonly known as: PRILOSEC Take 1 capsule (40 mg total) by mouth 2 (two) times daily before a meal.   PATADAY OP Place 1 drop into both eyes daily.   pravastatin 40 MG tablet Commonly known as: PRAVACHOL Take 1 tablet (40 mg total) by mouth daily. TAKE 1 TABLET BY MOUTH EVERYDAY AT BEDTIME   sodium chloride 0.65 % Soln nasal spray Commonly known as: OCEAN Place 1 spray into both nostrils as needed for congestion.   Synthroid 100 MCG tablet Generic drug: levothyroxine TAKE 1 TABLET BY MOUTH EVERY DAY BEFORE BREAKFAST   Zolpidem Tartrate 3.5 MG Subl TAKE 1 TABLET BY MOUTH AT BEDTIME AS NEEDED.        Review of Systems:  Review of Systems  Constitutional:  Negative for fatigue, fever and unexpected weight change.  HENT:  Positive for congestion, hearing loss and sinus pressure. Negative for voice change.        Chronic sinusitis, underwent ENT, sleep study-sleep apnea.   Eyes:  Positive for visual disturbance.       Left lateral visual field deficit  Respiratory:  Negative for cough and shortness of breath.   Cardiovascular:  Negative for leg swelling.  Gastrointestinal:  Negative for abdominal pain and constipation.  Genitourinary:  Positive for frequency. Negative for dysuria and urgency.       Urinary leakage. Doing Kegel exercise.    Musculoskeletal:  Negative for back pain and gait problem.  Skin:  Negative for color change.  Neurological:  Negative for speech difficulty, weakness and headaches.  Psychiatric/Behavioral:  Positive for sleep disturbance. Negative for dysphoric mood. The patient is not nervous/anxious.        Early am awake, difficulty returning asleep.    Health Maintenance  Topic Date Due   DEXA SCAN  Never done   TETANUS/TDAP  11/26/2018   Zoster Vaccines- Shingrix (2 of 2)  02/14/2019   COVID-19 Vaccine (4 - Booster for Moderna series) 11/28/2020 (Originally 07/31/2020)   INFLUENZA VACCINE  04/17/2021 (Originally 08/18/2020)   COLONOSCOPY (Pts 45-48yrs Insurance coverage will need to be confirmed)  10/25/2021   Pneumonia Vaccine 22+ Years old  Completed   HPV VACCINES  Aged Out    Physical Exam: Vitals:   11/27/20 1444  BP: 126/72  Pulse: 68  Temp: (!) 97.2 F (36.2 C)  TempSrc: Temporal  SpO2: 96%  Weight: 161 lb (73 kg)  Height: 5\' 3"  (1.6 m)   Body mass index is 28.52 kg/m. Physical Exam Vitals and nursing note reviewed.  Constitutional:      Appearance: Normal appearance.  HENT:     Head: Normocephalic and atraumatic.     Ears:     Comments: Right tympanic membrane hazy, small fluid effusion behind left tympanic membrane.     Nose: Nose normal.     Mouth/Throat:     Mouth: Mucous membranes are moist.  Eyes:     Extraocular Movements: Extraocular movements intact.     Conjunctiva/sclera: Conjunctivae normal.     Pupils: Pupils are equal, round, and reactive to light.     Comments: Left peripheral visual field deficit since CVA 2006  Cardiovascular:     Rate and Rhythm: Normal rate and regular rhythm.     Heart sounds: No murmur heard. Pulmonary:     Effort: Pulmonary effort is normal.     Breath sounds: No rales.  Abdominal:     General: Bowel sounds are normal.     Palpations: Abdomen is soft.     Tenderness: There is no abdominal tenderness.  Musculoskeletal:      Cervical back: Normal range of motion and neck supple.     Right lower leg: No edema.     Left lower leg: No edema.  Skin:    General: Skin is warm and dry.  Neurological:     General: No focal deficit present.     Mental Status: She is alert and oriented to person, place, and time. Mental status is at baseline.     Gait: Gait normal.  Psychiatric:        Mood and Affect: Mood normal.        Behavior: Behavior normal.        Thought Content: Thought content normal.        Judgment: Judgment normal.    Labs reviewed: Basic Metabolic Panel: Recent Labs    12/26/19 1053 02/07/20 1756 08/29/20 0427  NA 142 142 142  K 4.3 3.8 3.7  CL 105 109 109  CO2 31 23 26   GLUCOSE 114* 115* 126*  BUN 14 15 17   CREATININE 0.80 0.78 0.74  CALCIUM 10.0 9.5 9.7   Liver Function Tests: Recent Labs    12/26/19 1053  AST 16  ALT 13  BILITOT 0.5  PROT 7.0   No results for input(s): LIPASE, AMYLASE in the last 8760 hours. No results for input(s): AMMONIA in the last 8760 hours. CBC: Recent Labs    12/26/19 1053 02/07/20 1756 08/29/20 0427  WBC 6.5 7.3 7.5  NEUTROABS 2,912  --   --   HGB 14.5 14.3 13.2  HCT 43.3 41.9 41.0  MCV 88.9 89.5 91.3  PLT 263 267 253   Lipid Panel: Recent Labs    12/26/19 1053  CHOL 180  HDL 51  LDLCALC 107*  TRIG 123  CHOLHDL 3.5   Lab Results  Component Value Date  HGBA1C 6.2 (H) 01/08/2020    Procedures since last visit: SLEEP STUDY DOCUMENTS  Result Date: 11/28/2020 Ordered by an unspecified provider.   Assessment/Plan  Chronic sinusitis Hx of chronic rhinitis, SOB, wheezing,  on Astelin, Flonase, Bronchodilator, underwent Pulmonology, ENT, Allergy evaluation, had spirometry 10/20/20, Echo 60-65% EF 04/16/20, CTA 09/01/20         Insomnia  Ambien, Valium  History of CVA (cerebrovascular accident) on plavix. Statin, residual of  poor left peripheral vision   Acquired hypothyroidism  Levothyroxine, TSH no recent test. Update  TSH  Essential hypertension blood pressure is controlled on Losartan qd. Followed by Cardiology. CTA negative 09/01/20. Bun/creat 17/0.74 08/29/20  Gastroesophageal reflux disease stable Omeprazole, saw GI, EGD 12/26/19  Dysphagia  Swallow studay 05/12/20, worked with ST  OSA (obstructive sleep apnea)  sleep study, saw Pulmonology.   Other spondylosis with radiculopathy, cervical region MR 02/26/20, C6-C7, saw Ortho  Hyperlipidemia  takes Pravastatin, Zetia, LDL 107 12/26/19, update lipid panel.   Incontinent of urine Takes Vibegron, seems better   Postmenopause DEXA, Vit D, Vit B12    Labs: lipid panel, Vitamin D, Vitamin B12, TSH, DEXA  Next appointment 3 months.

## 2020-11-27 NOTE — Assessment & Plan Note (Signed)
Ambien, Valium 

## 2020-11-27 NOTE — Assessment & Plan Note (Signed)
sleep study, saw Pulmonology.

## 2020-11-27 NOTE — Assessment & Plan Note (Signed)
blood pressure is controlled on Losartan qd. Followed by Cardiology. CTA negative 09/01/20. Bun/creat 17/0.74 08/29/20

## 2020-11-27 NOTE — Assessment & Plan Note (Signed)
on plavix. Statin,  residual of  poor left peripheral vision  

## 2020-11-28 ENCOUNTER — Encounter: Payer: Self-pay | Admitting: Nurse Practitioner

## 2020-12-02 ENCOUNTER — Other Ambulatory Visit: Payer: Self-pay

## 2020-12-02 ENCOUNTER — Other Ambulatory Visit: Payer: Medicare Other

## 2020-12-02 DIAGNOSIS — E782 Mixed hyperlipidemia: Secondary | ICD-10-CM

## 2020-12-02 DIAGNOSIS — E039 Hypothyroidism, unspecified: Secondary | ICD-10-CM

## 2020-12-02 DIAGNOSIS — Z780 Asymptomatic menopausal state: Secondary | ICD-10-CM

## 2020-12-05 LAB — VITAMIN B12: Vitamin B-12: 383 pg/mL (ref 200–1100)

## 2020-12-05 LAB — VITAMIN D 1,25 DIHYDROXY
Vitamin D 1, 25 (OH)2 Total: 39 pg/mL (ref 18–72)
Vitamin D2 1, 25 (OH)2: <8 pg/mL
Vitamin D3 1, 25 (OH)2: 39 pg/mL

## 2020-12-05 LAB — TSH: TSH: 1.8 mIU/L (ref 0.40–4.50)

## 2020-12-05 LAB — EXTRA LAV TOP TUBE

## 2020-12-08 ENCOUNTER — Telehealth: Payer: Self-pay | Admitting: Pulmonary Disease

## 2020-12-08 ENCOUNTER — Telehealth: Payer: Self-pay | Admitting: Gastroenterology

## 2020-12-08 NOTE — Telephone Encounter (Signed)
Call patient  Sleep study result  Date of study: 11/26/2020  Impression: Mild obstructive sleep apnea Mild oxygen desaturations  Recommendation:  Options of treatment for mild obstructive sleep apnea  1.  CPAP therapy may be considered if patient has significant daytime symptoms that can be ascribed to be an effect of sleep disordered breathing. If CPAP therapy is chosen as option of treatment, auto titrating CPAP with pressure settings of 5-15 will be appropriate  2.  An oral device may also be considered as an option of treatment for mild sleep disordered breathing  3.  Watchful waiting with focus on weight loss and sleep position modification may also be appropriate if not symptomatic.  Sleep position optimization by encouraging sleep in a lateral position, elevating the head of the bed by about 30 degrees may help Close clinical follow up with compliance monitoring to optimize therapeutic efficiency Weight loss measures encouraged She should be cautioned against driving when sleepy & against medications with sedative side effects  Schedule for follow-up for further discussions regarding options of care

## 2020-12-08 NOTE — Procedures (Signed)
POLYSOMNOGRAPHY  Last, First: Darlene, [name] MRN: [id number] Gender: Female Age (years): 69 Weight (lbs): 158 DOB: December 18, 1933 BMI: 28 Primary Care: No PCP Epworth Score: 6 Referring: Laurin Coder MD Technician: Carolin Coy Interpreting: Laurin Coder MD Study Type: NPSG Ordered Study Type: NPSG Study date: 11/26/2020 Location: [place] CLINICAL INFORMATION Darlene [name] is a 87 year old Female and was referred to the sleep center for evaluation of G47.80 Other Sleep Disorders. Indications include Daytime Fatigue, Fatigue, Hypertension.  MEDICATIONS Patient self administered medications include: ZOLPIDEM TARTRATE, ASTELIN. Medications administered during study include Sleep medicine administered - Zolpidem 3.5 mg at 10:45:00 PM  SLEEP STUDY TECHNIQUE A multi-channel overnight Polysomnography study was performed. The channels recorded and monitored were central and occipital EEG, electrooculogram (EOG), submentalis EMG (chin), nasal and oral airflow, thoracic and abdominal wall motion, anterior tibialis EMG, snore microphone, electrocardiogram, and a pulse oximetry. TECHNICIAN COMMENTS Comments added by Technician: Patient was ordered as a Npsg only. Comments added by Scorer: N/A SLEEP ARCHITECTURE The study was initiated at 11:06:16 PM and terminated at 5:03:11 AM. The total recorded time was 356.9 minutes. EEG confirmed total sleep time was 282.5 minutes yielding a sleep efficiency of 79.1%%. Sleep onset after lights out was 2.8 minutes with a REM latency of 197.5 minutes. The patient spent 14.9%% of the night in stage N1 sleep, 83.0%% in stage N2 sleep, 0.0%% in stage N3 and 2.1% in REM. Wake after sleep onset (WASO) was 71.6 minutes. The Arousal Index was 27.2/hour. RESPIRATORY PARAMETERS There were a total of 68 respiratory disturbances out of which 3 were apneas ( 1 obstructive, 0 mixed, 2 central) and 65 hypopneas. The apnea/hypopnea index (AHI) was 14.4 events/hour.  The central sleep apnea index was 0.4 events/hour. The REM AHI was 10.0 events/hour and NREM AHI was 14.5 events/hour. The supine AHI was 12.1 events/hour and the non supine AHI was 18.2 supine during 61.42% of sleep. Respiratory disturbances were associated with oxygen desaturation down to a nadir of 85.0% during sleep. The mean oxygen saturation during the study was 92.2%. The cumulative time under 88% oxygen saturation was 5.5 minutes.  LEG MOVEMENT DATA The total leg movements were 12 with a resulting leg movement index of 2.5/hr .Associated arousal with leg movement index was 0.0/hr.  CARDIAC DATA The underlying cardiac rhythm was most consistent with sinus rhythm. Mean heart rate during sleep was 54.8 bpm. Additional rhythm abnormalities include None.  IMPRESSIONS - Mild Obstructive Sleep apnea(OSA) - EKG showed no cardiac abnormalities. - Mild Oxygen Desaturation - The patient snored with soft snoring volume. - No significant periodic leg movements(PLMs) during sleep. However, no significant associated arousals. - Previous diagnosis of obstructive sleep apnea, declined to use CPAP  DIAGNOSIS - Obstructive Sleep Apnea (G47.33) - Nocturnal Hypoxemia (G47.36)  RECOMMENDATIONS - Therapeutic CPAP titration to determine optimal pressure required to alleviate sleep disordered breathing, auto titrating CPAP may also be considered. - An oral device may be considered as option for treatment of mild obstructive sleep apnea - Watchful waiting with focus on weight loss measures - Avoid alcohol, sedatives and other CNS depressants that may worsen sleep apnea and disrupt normal sleep architecture. - Sleep hygiene should be reviewed to assess factors that may improve sleep quality. - Weight management and regular exercise should be initiated or continued.  [Electronically signed] 12/08/2020 09:17 AM  Sherrilyn Rist MD NPI: [id number]

## 2020-12-08 NOTE — Telephone Encounter (Signed)
I called the pt and we discussed her symptoms.  She has abd discomfort after eating lunch and dinner most days.  She says it is not one particular food that she has noticed. She is taking PPI BID.  This is not severe just more of an annoyance.  She is going out of town Wed morning at 6 am and wanted to be seen.  There are no appts available. She has been set up to see Dr Rush Landmark on 1/3.  She has also called her PCP to see if they can eval her prior to her leaving town.  She will call periodically to see if we have had any cancellations.  Also, if her pain worsens she will call back.

## 2020-12-08 NOTE — Telephone Encounter (Signed)
Pt stopped by at the office this morning stating that she has been having abdominal pain after eating. She has been taking medication as instructed but it is not helping either. She is going out of town on Wednesday so would like some advise on what to do. Pls call her after 2:30pm.

## 2020-12-08 NOTE — Telephone Encounter (Signed)
ATC LMVTCB x 1  

## 2020-12-09 NOTE — Telephone Encounter (Signed)
Call made to patient, confirmed DOB. Patient made aware of Sleep Study results. She is very adamant that she does not want a cpap. She states she has tried that and she was not able to do it and will not try it again. She reports she travels a lot and not only does she not like the cpap but it will not be conducive to her traveling lifestyle. She is interested in the oral device. She states she has an appt on 12/8 and she would like to discuss in further detail at that time.   Nothing further needed at this time.

## 2020-12-18 ENCOUNTER — Encounter: Payer: Self-pay | Admitting: Nurse Practitioner

## 2020-12-18 ENCOUNTER — Ambulatory Visit: Payer: Medicare Other | Admitting: Nurse Practitioner

## 2020-12-18 VITALS — BP 122/68 | HR 68 | Ht 63.0 in | Wt 157.4 lb

## 2020-12-18 DIAGNOSIS — R143 Flatulence: Secondary | ICD-10-CM | POA: Diagnosis not present

## 2020-12-18 DIAGNOSIS — R140 Abdominal distension (gaseous): Secondary | ICD-10-CM | POA: Diagnosis not present

## 2020-12-18 NOTE — Patient Instructions (Signed)
If you are age 85 or older, your body mass index should be between 23-30. Your Body mass index is 27.88 kg/m. If this is out of the aforementioned range listed, please consider follow up with your Primary Care Provider.  If you are age 73 or younger, your body mass index should be between 19-25. Your Body mass index is 27.88 kg/m. If this is out of the aformentioned range listed, please consider follow up with your Primary Care Provider.   The [facility] providers would like to encourage you to use [facility] to communicate with providers for non-urgent requests or questions.  Due to long hold times on the telephone, sending your provider a message by [facility] may be faster and more efficient way to get a response. Please allow 48 business hours for a response.  Please remember that this is for non-urgent requests/questions.  FDgard 2 capsules twice a day as needed for abdominal discomfort/bloating. We have provided some samples today. You can purchase more over the counter. Take Gas-X as needed.  We have scheduled you a follow up with Tye Savoy, NP on 01/20/21 at 2:00. Call us if your symptoms worsen.  Avoid artificial sweeteners and lactose until you come back in January.  It was great seeing you today! Thank you for entrusting me with your care and choosing [facility].  Tye Savoy, NP

## 2020-12-18 NOTE — Progress Notes (Signed)
ASSESSMENT AND PLAN    #87 year old female with a 3-week history of postprandial upper abdominal bloating without pain, nausea, vomiting or weight loss.  Patient feels like she has a large gas / air "bubble in her intestines. Etiology unclear.  Doubt postinfectious as she does not recall having any recent GI illnesses.    She has marked esophageal dysmotility but her symptoms are upper abdomen and likely unrelated  --Avoid dairy products for now  -- Trial of FD guard --Trial of Gas-X as needed --Follow-up with me in 3 weeks --Need for further testing will depend on clinical course   HISTORY OF PRESENT ILLNESS    Chief Complaint : upper abdominal bloating after eating.   Darlene [name] is a 87 y.o. female , known to Dr. Rush [name] with a past medical history of CVA on Plavix, atrial fibrillation, sleep apnea, anxiety/depression, colon polyps ( TA + SSA), hiatal hernia , diverticulosis , colonic AVMs,  appendectomy,  esophageal dysmotility.  Additional medical history as listed in [place] .   Patient previously seen by Darlene Ba, PA and also Dr. Rush [name] ( last office visit 10/29/20) for evaluation / management of esophageal dysmotility and chronic cough.     Over the last three weeks Darlene [name] has been having upper abdominal bloating starting within 30 min to 1 hour after eating,  regardless of what or how much she eats.  She really does not have any associated abdominal pain.  She has no associated nausea / vomiting, and no associated bowel changes.  No unexplained weight loss. Episodes generally last about an hour then the bloating dissipates and she is okay until the next meal.  Except for last night she cannot say that the bloating has been associated with any increased amounts of belching or flatus.  She does feel like there is a big air/gas bubble in her intestines.   This onset of upper abdominal bloating was not preceded by any illnesses such as gastroenteritis.  The only new med  that she started is [place] and she has been on that for months.  Zetia on home med list but she hasn't started it.  She has not recently made any dietary changes.  She drinks milk and eats ice cream.  Occasionally she uses artificial sweeteners.   PREVIOUS ENDOSCOPIC EVALUATIONS / PERTINENT STUDIES:    EGD October 2020 -LA Grade C esophagitis at GE junction, 4 cm hiatal hernia. Erythematous mucosa in the antrum. Biopsied. Multiple gastric polyps - likely fundic gland. Biopsied a few. - No gross lesions in the duodenal bulb, in the first portion of the duodenum and in the second portion of the duodenum. Biopsied.  Polyp surveillance colonoscopy Oct 2020 --Perianal skin tags found on perianal exam. Hemorrhoids found on digital rectal exam. The examined portion of the ileum was normal. A single colonic angioectasia. - Five 2 to 13 mm polyps in the ascending colon and in the cecum, removed with a cold snare.Resected and retrieved. Diverticulosis in the recto-sigmoid colon, in the sigmoid colon, in the transverse colon and in the ascending colon. Normal mucosa in the entire examined colon otherwise. Non-bleeding non-thrombosed external and internal hemorrhoids  Diagnosis 1. Surgical [P], duodenum - PEPTIC DUODENITIS - NO DYSPLASIA OR MALIGNANCY IDENTIFIED 2. Surgical [P], random gastric sites - REACTIVE GASTROPATHY WITH FOCAL CHRONIC INFLAMMATION - NO H. PYLORI OR INTESTINAL METAPLASIA IDENTIFIED - SEE COMMENT 3. Surgical [P], gastric polyps - FUNDIC GLAND POLYP(S) - NO H. PYLORI, INTESTINAL METAPLASIA OR MALIGNANCY IDENTIFIED  4. Surgical [P], esophageal - BENIGN SQUAMOUS MUCOSA - NO INCREASED INTRAEPITHELIAL EOSINOPHILS - SEE COMMENT 5. Surgical [P], colon, ascending and cecum, polyp (5) - TUBULAR ADENOMA (2 FRAGMENTS) - MULTIPLE FRAGMENTS OF SESSILE SERRATED POLYP(S) - NO HIGH GRADE DYSPLASIA OR MALIGNANCY IDENTIFIED   EGD December 2021 for evaluation of dysphagia and chronic  cough --No gross lesions in esophagus. Dilated with evidence of mucosal wrent being present, 5 cm hiatal hernia. No gross lesions in the stomach. No gross lesions in the duodenal bulb, in the first portion of the duodenum and in the second portion of the duodenum.   Current Medications, Allergies, Past Medical History, Past Surgical History, Family History and Social History were reviewed in [employer] record.     Current Outpatient Medications  Medication Sig Dispense Refill   acetaminophen (TYLENOL) 500 MG tablet Take 25-500 mg by mouth every 6 (six) hours as needed for mild pain or headache.     azelastine (ASTELIN) 0.1 % nasal spray Place 1 to 2 sprays each nostril 1-2 times a day as needed for runny nose/drainage down throat 30 mL 5   clopidogrel (PLAVIX) 75 MG tablet TAKE 1 TABLET BY MOUTH EVERY DAY 90 tablet 1   cycloSPORINE (RESTASIS) 0.05 % ophthalmic emulsion Place 1 drop into both eyes 2 (two) times daily.     diazepam (VALIUM) 5 MG tablet Take 0.5 tablets (2.5 mg total) by mouth as needed (anxiety). WILL NEED APPOINTMENT FOR ADDITIONAL REFILLS 15 tablet 0   fluticasone (FLONASE) 50 MCG/ACT nasal spray Place 1-2 sprays into both nostrils daily. 16 g 5   GEMTESA 75 MG TABS Take 75 mg by mouth daily.     ketoconazole (NIZORAL) 2 % shampoo Apply 1 application topically See admin instructions. Twice weekly as needed for scalp irritation. 120 mL 1   losartan (COZAAR) 50 MG tablet Take 1 tablet (50 mg total) by mouth daily. Appointment due prior to additional refills 90 tablet 1   nitroGLYCERIN (NITROSTAT) 0.4 MG SL tablet Place 1 tablet (0.4 mg total) under the tongue every 5 (five) minutes as needed for up to 25 days for chest pain. 25 tablet 3   Olopatadine HCl (PATADAY OP) Place 1 drop into both eyes daily.     omeprazole (PRILOSEC) 40 MG capsule Take 1 capsule (40 mg total) by mouth 2 (two) times daily before a meal. 180 capsule 4   pravastatin (PRAVACHOL) 40 MG  tablet Take 1 tablet (40 mg total) by mouth daily. TAKE 1 TABLET BY MOUTH EVERYDAY AT BEDTIME 90 tablet 2   sodium chloride (OCEAN) 0.65 % SOLN nasal spray Place 1 spray into both nostrils as needed for congestion.     SYNTHROID 100 MCG tablet TAKE 1 TABLET BY MOUTH EVERY DAY BEFORE BREAKFAST 90 tablet 1   Zolpidem Tartrate 3.5 MG SUBL TAKE 1 TABLET BY MOUTH AT BEDTIME AS NEEDED. 30 tablet 0   No current facility-administered medications for this visit.    Review of Systems: No chest pain. No shortness of breath. No urinary complaints.   PHYSICAL EXAM :    Wt Readings from Last 3 Encounters:  12/18/20 157 lb 6 oz (71.4 kg)  11/27/20 161 lb (73 kg)  11/26/20 158 lb (71.7 kg)    BP 122/68   Pulse 68   Ht 5\' 3"  (1.6 m)   Wt 157 lb 6 oz (71.4 kg)   SpO2 98%   BMI 27.88 kg/m  Constitutional:  Generally well appearing female in  no acute distress. Psychiatric: Pleasant. Normal mood and affect. Behavior is normal. EENT: Pupils normal.  Conjunctivae are normal. No scleral icterus. Neck supple.  Cardiovascular: Normal rate, regular rhythm. No edema Pulmonary/chest: Effort normal and breath sounds normal. No wheezing, rales or rhonchi. Abdominal: Soft, nondistended, nontender. Bowel sounds active throughout. There are no masses palpable. No hepatomegaly. Neurological: Alert and oriented to person place and time. Skin: Skin is warm and dry. No rashes noted.  Tye Savoy, NP  12/18/2020, 11:57 AM

## 2020-12-19 NOTE — Progress Notes (Signed)
Attending Physician's Attestation   I have reviewed the chart.   I agree with the Advanced Practitioner's note, impression, and recommendations with any updates as below.    Derrall Hicks Mansouraty, MD [facility] Office # [phone]  

## 2020-12-22 ENCOUNTER — Other Ambulatory Visit: Payer: Self-pay | Admitting: Nurse Practitioner

## 2020-12-22 DIAGNOSIS — E2839 Other primary ovarian failure: Secondary | ICD-10-CM

## 2020-12-25 ENCOUNTER — Other Ambulatory Visit: Payer: Self-pay

## 2020-12-25 ENCOUNTER — Ambulatory Visit
Admission: RE | Admit: 2020-12-25 | Discharge: 2020-12-25 | Disposition: A | Discharge status: Home / Self Care | Payer: Medicare Other | Source: Ambulatory Visit | Attending: Nurse Practitioner | Admitting: Nurse Practitioner

## 2020-12-25 DIAGNOSIS — E2839 Other primary ovarian failure: Secondary | ICD-10-CM

## 2020-12-26 ENCOUNTER — Encounter: Payer: Self-pay | Admitting: Pulmonary Disease

## 2020-12-26 ENCOUNTER — Ambulatory Visit (INDEPENDENT_AMBULATORY_CARE_PROVIDER_SITE_OTHER): Payer: Medicare Other | Admitting: Pulmonary Disease

## 2020-12-26 ENCOUNTER — Ambulatory Visit: Payer: Medicare Other | Admitting: Pulmonary Disease

## 2020-12-26 ENCOUNTER — Other Ambulatory Visit: Payer: Self-pay

## 2020-12-26 VITALS — BP 110/72 | HR 69 | Temp 98.4°F | Ht 63.0 in | Wt 159.0 lb

## 2020-12-26 DIAGNOSIS — R0602 Shortness of breath: Secondary | ICD-10-CM

## 2020-12-26 DIAGNOSIS — G4733 Obstructive sleep apnea (adult) (pediatric): Secondary | ICD-10-CM | POA: Diagnosis not present

## 2020-12-26 DIAGNOSIS — K219 Gastro-esophageal reflux disease without esophagitis: Secondary | ICD-10-CM

## 2020-12-26 LAB — PULMONARY FUNCTION TEST
DL/VA % pred: 99 %
DL/VA: 4.04 ml/min/mmHg/L
DLCO cor % pred: 76 %
DLCO cor: 13.68 ml/min/mmHg
DLCO unc % pred: 76 %
DLCO unc: 13.68 ml/min/mmHg
FEF 25-75 Post: 2.57 L/sec
FEF 25-75 Pre: 1.69 L/sec
FEF2575-%Change-Post: 51 %
FEF2575-%Pred-Post: 235 %
FEF2575-%Pred-Pre: 154 %
FEV1-%Change-Post: 6 %
FEV1-%Pred-Post: 108 %
FEV1-%Pred-Pre: 101 %
FEV1-Post: 1.81 L
FEV1-Pre: 1.69 L
FEV1FVC-%Change-Post: 5 %
FEV1FVC-%Pred-Pre: 110 %
FEV6-%Change-Post: 1 %
FEV6-%Pred-Post: 99 %
FEV6-%Pred-Pre: 98 %
FEV6-Post: 2.12 L
FEV6-Pre: 2.09 L
FEV6FVC-%Pred-Post: 106 %
FEV6FVC-%Pred-Pre: 106 %
FVC-%Change-Post: 1 %
FVC-%Pred-Post: 93 %
FVC-%Pred-Pre: 92 %
FVC-Post: 2.12 L
FVC-Pre: 2.09 L
Post FEV1/FVC ratio: 85 %
Post FEV6/FVC ratio: 100 %
Pre FEV1/FVC ratio: 81 %
Pre FEV6/FVC Ratio: 100 %
RV % pred: 79 %
RV: 1.95 L
TLC % pred: 98 %
TLC: 4.83 L

## 2020-12-26 MED ORDER — ZOLPIDEM TARTRATE 3.5 MG SL SUBL: 1.0000 | SUBLINGUAL_TABLET | Freq: Every evening | SUBLINGUAL | 5 refills | Status: DC | PRN

## 2020-12-26 NOTE — Progress Notes (Signed)
Full PFT performed today. °

## 2020-12-26 NOTE — Telephone Encounter (Signed)
RX last refilled on 10/20/2020   Notation made on pending appointment in March 2023 to sign a treatment agreement

## 2020-12-26 NOTE — Patient Instructions (Signed)
Full PFT performed today. °

## 2020-12-26 NOTE — Patient Instructions (Addendum)
Follow-up in 6 months  Decision regarding sleep apnea treatment -An oral device -If your current dentist does work with sleep apnea then they can fashion an oral device -If not, a referral to a dentist for an oral device can be made  -Encourage weight loss efforts  Your breathing study today was within normal limits   Call with significant concerns

## 2020-12-26 NOTE — Progress Notes (Signed)
Darlene [name]    [id number]    27-Jul-1933  Primary Care Physician:[name], [name], NP  Referring Physician: [name], [name], NP [address],  [place] [address]  Chief complaint:   Follow-up for mild obstructive sleep apnea   HPI:  Recent in lab sleep study shows mild obstructive sleep apnea -Patient is certain she does not want to use CPAP as she has declined to use CPAP in the past, has had about 2 sleep studies Was previously diagnosed about 10 years ago and refused CPAP at that time, repeat study about 5 years ago-declined CPAP at the time as well  Shortness of breath with activity Shortness of breath she states is worse at night She does have wheezing Has nasal stuffiness and congestion  She states that when she travels out of [place], example when she goes back to [place]-her breathing does feel better She followed up with ENT  She clearly states today that even if she is diagnosed with sleep apnea she is unlikely to want to use CPAP Just will not work for her Other options of treatment for sleep disordered breathing was discussed  She recently was prescribed albuterol which she has not started to use, stated she did not know how to use it -I did demonstrate how to use the inhaler and teachings provided  Usually tries to go to bed between 11 PM and finally gets out of bed about 7 AM falls asleep immediately 2-4 awakenings Finally out of bed about 7:45 AM Weight has been relatively stable  She does use zolpidem to help her sleep  Multiple awakenings for her incontinence  Never smoker  She does have a lot of allergies She does use a Nettie pot at night and also Astelin  Outpatient Encounter Medications as of 12/26/2020  Medication Sig   acetaminophen (TYLENOL) 500 MG tablet Take 25-500 mg by mouth every 6 (six) hours as needed for mild pain or headache.   azelastine (ASTELIN) 0.1 % nasal spray Place 1 to 2 sprays each nostril 1-2 times a day  as needed for runny nose/drainage down throat   clopidogrel (PLAVIX) 75 MG tablet TAKE 1 TABLET BY MOUTH EVERY DAY   cycloSPORINE (RESTASIS) 0.05 % ophthalmic emulsion Place 1 drop into both eyes 2 (two) times daily.   diazepam (VALIUM) 5 MG tablet Take 0.5 tablets (2.5 mg total) by mouth as needed (anxiety). WILL NEED APPOINTMENT FOR ADDITIONAL REFILLS   ezetimibe (ZETIA) 10 MG tablet Take 1 tablet (10 mg total) by mouth daily.   fluticasone (FLONASE) 50 MCG/ACT nasal spray Place 1-2 sprays into both nostrils daily.   GEMTESA 75 MG TABS Take 75 mg by mouth daily.   ketoconazole (NIZORAL) 2 % shampoo Apply 1 application topically See admin instructions. Twice weekly as needed for scalp irritation.   losartan (COZAAR) 50 MG tablet Take 1 tablet (50 mg total) by mouth daily. Appointment due prior to additional refills   nitroGLYCERIN (NITROSTAT) 0.4 MG SL tablet Place 1 tablet (0.4 mg total) under the tongue every 5 (five) minutes as needed for up to 25 days for chest pain.   Olopatadine HCl (PATADAY OP) Place 1 drop into both eyes daily.   omeprazole (PRILOSEC) 40 MG capsule Take 1 capsule (40 mg total) by mouth 2 (two) times daily before a meal.   pravastatin (PRAVACHOL) 40 MG tablet Take 1 tablet (40 mg total) by mouth daily. TAKE 1 TABLET BY MOUTH EVERYDAY AT  BEDTIME   sodium chloride (OCEAN) 0.65 % SOLN nasal spray Place 1 spray into both nostrils as needed for congestion.   SYNTHROID 100 MCG tablet TAKE 1 TABLET BY MOUTH EVERY DAY BEFORE BREAKFAST   Zolpidem Tartrate 3.5 MG SUBL Take 1 tablet by mouth at bedtime as needed.   No facility-administered encounter medications on file as of 12/26/2020.    Allergies as of 12/26/2020 - Review Complete 12/26/2020  Allergen Reaction Noted   Norco [hydrocodone-acetaminophen] Nausea And Vomiting 05/17/2019   Dust mite extract Other (See Comments) 09/06/2017   Oxycontin [oxycodone hcl] Nausea And Vomiting 09/06/2017   Pollen extract Other (See  Comments) 09/06/2017    Past Medical History:  Diagnosis Date   Anxiety    Atypical mole 11/11/2020   Left Inner Knee (moderate-free)   Colon polyps    Depression    Hyperlipidemia    Hypertension    Hypothyroidism    Melanoma in situ ([place]) 09/19/2018   Right Arm (excision)   Skin cancer    Sleep apnea    Stroke ([place]) 2008    Past Surgical History:  Procedure Laterality Date   ADENOIDECTOMY     APPENDECTOMY  1960   CATARACT EXTRACTION Left    COLONOSCOPY     GUM SURGERY  03/2019   LUMBAR [place] SURGERY  2014   MELANOMA EXCISION  2020   MESH APPLIED TO LAP PORT     TONSILLECTOMY  1940    Family History  Problem Relation Age of Onset   Lung cancer Mother 11       smoker   Heart disease Mother    Stroke Father 52   Heart attack Father    Allergic rhinitis Sister    Hypertension Sister    CVA Brother    Lung cancer Maternal Grandfather    Heart attack Paternal Grandfather    Melanoma Daughter    Psoriasis Daughter    Rheum arthritis Daughter    Bipolar disorder Daughter    Colon cancer Neg Hx    Esophageal cancer Neg Hx    Inflammatory bowel disease Neg Hx    Liver disease Neg Hx    Pancreatic cancer Neg Hx    Stomach cancer Neg Hx    Rectal cancer Neg Hx     Social History   Socioeconomic History   Marital status: Widowed    Spouse name: Not on file   Number of children: 3   Years of education: Not on file   Highest education level: Not on file  Occupational History   Occupation: retired Radiographer, therapeutic  Tobacco Use   Smoking status: Never   Smokeless tobacco: Never  Vaping Use   Vaping Use: Never used  Substance and Sexual Activity   Alcohol use: Yes    Comment: hardly ever   Drug use: Never   Sexual activity: Not Currently  Other Topics Concern   Not on file  Social History Narrative   Social History      Diet? ok      Do you drink/eat things with caffeine? Weak coffee, (much milk)      Marital status?          divorced                           What year were you married? 1961      Do you live in a house, apartment, assisted living, condo, trailer,  etc.? apartment      Is it one or more stories? 4      How many persons live in your home? Only me      Do you have any pets in your home? (please list) no      Highest level of education completed? 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             yes                         Type & how often? Walk- would love to return to water exercise      Advanced Directives      Do you have a living will? yes      Do you have a DNR form?           no                       If not, do you want to discuss one? no      Do you have signed POA/HPOA for forms? no      Functional Status      Do you have difficulty bathing or dressing yourself?  no      Do you have difficulty preparing food or eating? no      Do you have difficulty managing your medications? no      Do you have difficulty managing your finances?  no      Do you have difficulty affording your medications? no      Social Determinants of Health   Financial Resource Strain: Not on file  Food Insecurity: Not on file  Transportation Needs: Not on file  Physical Activity: Not on file  Stress: Not on file  Social Connections: Not on file  Intimate Partner Violence: Not on file    Review of Systems  Constitutional:  Positive for fatigue.  Respiratory:  Positive for apnea, shortness of breath and wheezing.   Psychiatric/Behavioral:  Positive for sleep disturbance.    Vitals:   12/26/20 1508  BP: 110/72  Pulse: 69  Temp: 98.4 F (36.9 C)  SpO2: 95%     Physical Exam Constitutional:      Appearance: Normal appearance.  HENT:     Head: Normocephalic.     Right Ear: Tympanic membrane normal.     Nose: Nose normal.     Mouth/Throat:     Mouth: Mucous membranes are moist.     Comments: Small oral vestibule, crowded  oropharynx Eyes:     Pupils: Pupils are equal, round, and reactive to light.  Cardiovascular:     Rate and Rhythm: Normal rate and regular rhythm.     Heart sounds: No murmur heard.   No friction rub.  Pulmonary:     Effort: No respiratory distress.     Breath sounds: No stridor. No wheezing or rhonchi.  Musculoskeletal:     Cervical back: No rigidity or tenderness.  Neurological:     Mental Status: She is alert.  Psychiatric:        Mood and Affect: Mood normal.     Data Reviewed: Patient had spirometry performed x2-no obstructive disease noted, bronchodilator response was not done  Pulmonary function study reviewed with the patient showing no significant obstruction or restriction  Sleep study reviewed showing mild obstructive  sleep apnea with an AHI of 14 -Patient has no daytime sleepiness  Assessment:  Shortness of breath, wheezing -Multiple allergies  History of obstructive sleep apnea -Mild sleep apnea on current study -She still does not want to use CPAP -Will consider other options of treatment which in this case would be an oral device  Nonrestorative sleep  Nonconsolidated sleep  Treatment options discussed with the patient  Plan/Recommendations:   Encouraged to continue using albuterol for symptom management  Encouraged to continue using nasal sprays and nasal rinses  Follow-up in 6 months  She may discuss an oral device with her dentist   Sherrilyn Rist MD [facility] 12/26/2020, 3:33 PM  CC: [name], [name], NP

## 2021-01-20 ENCOUNTER — Ambulatory Visit: Payer: Medicare Other | Admitting: Gastroenterology

## 2021-01-20 ENCOUNTER — Encounter: Payer: Self-pay | Admitting: Nurse Practitioner

## 2021-01-20 ENCOUNTER — Ambulatory Visit: Payer: Medicare HMO | Admitting: Nurse Practitioner

## 2021-01-20 VITALS — BP 130/70 | HR 68 | Ht 62.0 in | Wt 158.1 lb

## 2021-01-20 DIAGNOSIS — R1010 Upper abdominal pain, unspecified: Secondary | ICD-10-CM | POA: Diagnosis not present

## 2021-01-20 DIAGNOSIS — R059 Cough, unspecified: Secondary | ICD-10-CM | POA: Diagnosis not present

## 2021-01-20 DIAGNOSIS — R1319 Other dysphagia: Secondary | ICD-10-CM | POA: Diagnosis not present

## 2021-01-20 NOTE — Progress Notes (Signed)
ASSESSMENT AND PLAN    # 88 yo female with recent development of postprandial upper abdominal pain and bloating without associated nausea/vomiting or weight loss.  Etiology unclear but symptoms significantly improved with twice daily FD Guard.  --Continue FD Guard for now. In about a month she can reduce dose to once daily for a few days and see how things go. If continues to do well then discontinue treatment --No gastric or duodenal lesions on last EGD December 2021  # GERD / chronic cough ( with and without eating), intermittent sore throat, dysphagia and difficulty singing due to mucous in throat. We have previously evaluated her for the symptoms.  She has a known history of LA grade C esophagitis and marked esophageal dysmotility.   She also has a history of marked esophageal dysmotility.  Interesting she tends to only have problems swallowing mainly eating and talking at the same time.   --Patient and I agree that it is difficult to evaluate symptoms in setting of unrelated allergies / sinus problems. She does not take allergy medications recommended by ENT (doesn't like to take medication on a regular basis).  --I asked that over the next 3-4 weeks she take whichever antihistamine was prescribed by ENT.  She will also continue nasal spray /Neti pot.  --Continue daily PPI. Despite lack of significant clinical improvement on high dose PPI , endoscopically there was resolution of esophagitis on follow up EGD Dec 2021.  --Call us with an update in 3-4 weeks.  --Depending on clinical course we may proceed with esophageal manometry  HISTORY OF PRESENT ILLNESS    Chief Complaint : follow up on postprandial upper abdominal pain / bloating. Also having ongoing cough, dysphagia  Darlene [name] is a 88 y.o. female known to Dr. Rush Landmark with a past medical history of  CVA on Plavix, atrial fibrillation, sleep apnea, anxiety/depression, colon polyps ( TA + SSA), hiatal hernia , chronic  cough,  diverticulosis , colonic AVMs,   appendectomy, esophageal dysmotility.  Additional medical history as listed in [place]    Darlene [name] returns for follow up. I saw her 12/18/20 for postprandial upper abdominal pain and bloating without associated N/V or weight loss. She was asked to temporarily stop dairy. She was given trial of FD Guard, recommended to try Gas-X and follow up in 3 weeks.  Interval History:   She didn't try Gas-x but is taking BID FD Guard which has been very helpful. . She has concurrently decreased dairy intake ( other than ice cream). She doesn't think Omeprazole helps and and reduced dose from twice a day to once daily.   Darlene [name] mentions that she is still  having the same issues she previously saw Dr. Rush Landmark and Nicoletta Ba, [place] for. She is still coughing which is not always related to eating. Sometimes her throat gets sore, throat lozenges help the discomfort. . She continues to have problems swallowing but only when trying to eat and talk at the same time. Additionally she has difficulty singing. She says there is mucous in her throat which interferes with singing.     EGD in 2020 showed a 4 cm HH, LA Grade C esophagitis at GE junction, gastritis and gastric polyps.   Repeat EGD December 2021 showed healing of the esophagitis. Esophagus was empirically dilated.  She had persistent symptoms . Esophagram and MBSS April 2022 showed marked esophageal dysmotility with stasis in the esophagus and potential distal esophageal narrowing in the setting of a  hiatal hernia.  She coughed throughout the study without any laryngeal penetration or aspiration .  It was felt that she was experiencing a hypersensitive cough response.    At the time of her last follow-up here with Dr. Rush Landmark mid October 2022 it was discussed that esophageal manometry could be helpful to further determine if she had IEM, nutcracker esophagus or diffuse esophageal spasms .  She was recommended to continue PPI.  The  hope was that the dysphagia would improve after further periodontal work.  She is still taking a PPI but does not feel it is helps symptoms and reduced the dose from twice daily to once daily.    Patient. gives a longstanding history of sinus problems / allergies. She has previosuly been followed by an ENT in [place] as well locally by Dr. Lucia Gaskins ( now retired). Saw Dr. Lucia Gaskins in September prior to his retirement.  She says that he has previously recommended treatment for her allergies.  However , she does not like to take medications on a daily basis.  Some of the medications make her drowsy.  Basically her allergies are untreated at present.  For sinus problems she does use a Neti pot and various nasal sprays.  She does feel like allergies/sinus drainage contributes to many of her after mentioned symptoms.   Darlene [name] had a melanoma removed from RUE a year or so ago. She had Dermatology follow-up a few months ago but is concerned about potential for metastasis.  It is taking a long time to get into her dermatologist so she has made an appointment with a different practice.   PREVIOUS ENDOSCOPIC EVALUATIONS / PERTINENT STUDIES:   EGD October 2020  -LA Grade C esophagitis at GE junction, 4 cm hiatal hernia. Erythematous mucosa in the antrum. Biopsied. Multiple gastric polyps - likely fundic gland. Biopsied a few. - No gross lesions in the duodenal bulb, in the first portion of the duodenum and in the second portion of the duodenum. Biopsied.   Polyp surveillance colonoscopy Oct 2020 --Perianal skin tags found on perianal exam. Hemorrhoids found on digital rectal exam. The examined portion of the ileum was normal. A single colonic angioectasia. - Five 2 to 13 mm polyps in the ascending colon and in the cecum, removed with a cold snare.Resected and retrieved. Diverticulosis in the recto-sigmoid colon, in the sigmoid colon, in the transverse colon and in the ascending colon. Normal mucosa in the  entire examined colon otherwise. Non-bleeding non-thrombosed external and internal hemorrhoids   Diagnosis 1. Surgical [P], duodenum - PEPTIC DUODENITIS - NO DYSPLASIA OR MALIGNANCY IDENTIFIED 2. Surgical [P], random gastric sites - REACTIVE GASTROPATHY WITH FOCAL CHRONIC INFLAMMATION - NO H. PYLORI OR INTESTINAL METAPLASIA IDENTIFIED - SEE COMMENT 3. Surgical [P], gastric polyps - FUNDIC GLAND POLYP(S) - NO H. PYLORI, INTESTINAL METAPLASIA OR MALIGNANCY IDENTIFIED 4. Surgical [P], esophageal - BENIGN SQUAMOUS MUCOSA - NO INCREASED INTRAEPITHELIAL EOSINOPHILS - SEE COMMENT 5. Surgical [P], colon, ascending and cecum, polyp (5) - TUBULAR ADENOMA (2 FRAGMENTS) - MULTIPLE FRAGMENTS OF SESSILE SERRATED POLYP(S) - NO HIGH GRADE DYSPLASIA OR MALIGNANCY IDENTIFIED  EGD December 2021 for evaluation of dysphagia, chronic cough and follow up on esophagitis --No gross lesions in esophagus. Dilated with evidence of mucosal wrent being present, 5 cm hiatal hernia. No gross lesions in the stomach. No gross lesions in the duodenal bulb, in the first portion of the duodenum and in the second portion of the duodenum.   Current Medications, Allergies, Past Medical  History, Past Surgical History, Family History and Social History were reviewed in [employer] record.     Current Outpatient Medications  Medication Sig Dispense Refill   acetaminophen (TYLENOL) 500 MG tablet Take 25-500 mg by mouth every 6 (six) hours as needed for mild pain or headache.     azelastine (ASTELIN) 0.1 % nasal spray Place 1 to 2 sprays each nostril 1-2 times a day as needed for runny nose/drainage down throat 30 mL 5   clopidogrel (PLAVIX) 75 MG tablet TAKE 1 TABLET BY MOUTH EVERY DAY 90 tablet 1   cycloSPORINE (RESTASIS) 0.05 % ophthalmic emulsion Place 1 drop into both eyes 2 (two) times daily.     diazepam (VALIUM) 5 MG tablet Take 0.5 tablets (2.5 mg total) by mouth as needed (anxiety). WILL NEED  APPOINTMENT FOR ADDITIONAL REFILLS 15 tablet 0   ezetimibe (ZETIA) 10 MG tablet Take 1 tablet (10 mg total) by mouth daily. 90 tablet 3   fluticasone (FLONASE) 50 MCG/ACT nasal spray Place 1-2 sprays into both nostrils daily. 16 g 5   GEMTESA 75 MG TABS Take 75 mg by mouth daily.     ketoconazole (NIZORAL) 2 % shampoo Apply 1 application topically See admin instructions. Twice weekly as needed for scalp irritation. 120 mL 1   losartan (COZAAR) 50 MG tablet Take 1 tablet (50 mg total) by mouth daily. Appointment due prior to additional refills 90 tablet 1   nitroGLYCERIN (NITROSTAT) 0.4 MG SL tablet Place 1 tablet (0.4 mg total) under the tongue every 5 (five) minutes as needed for up to 25 days for chest pain. 25 tablet 3   Olopatadine HCl (PATADAY OP) Place 1 drop into both eyes daily.     omeprazole (PRILOSEC) 40 MG capsule Take 1 capsule (40 mg total) by mouth 2 (two) times daily before a meal. 180 capsule 4   pravastatin (PRAVACHOL) 40 MG tablet Take 1 tablet (40 mg total) by mouth daily. TAKE 1 TABLET BY MOUTH EVERYDAY AT BEDTIME 90 tablet 2   sodium chloride (OCEAN) 0.65 % SOLN nasal spray Place 1 spray into both nostrils as needed for congestion.     SYNTHROID 100 MCG tablet TAKE 1 TABLET BY MOUTH EVERY DAY BEFORE BREAKFAST 90 tablet 1   Zolpidem Tartrate 3.5 MG SUBL Take 1 tablet by mouth at bedtime as needed. 30 tablet 5   No current facility-administered medications for this visit.    Review of Systems: No chest pain. No shortness of breath. No urinary complaints.   PHYSICAL EXAM :    Wt Readings from Last 3 Encounters:  01/20/21 158 lb 2 oz (71.7 kg)  12/26/20 159 lb (72.1 kg)  12/18/20 157 lb 6 oz (71.4 kg)    Ht 5\' 2"  (1.575 m) Comment: height measureed without shoes   Wt 158 lb 2 oz (71.7 kg)    BMI 28.92 kg/m  Constitutional:  Generally well appearing female in no acute distress. Psychiatric: Pleasant. Normal mood and affect. Behavior is normal. EENT: Pupils normal.   Conjunctivae are normal. No scleral icterus. Neck supple.  Cardiovascular: Normal rate, regular rhythm. No edema Pulmonary/chest: Effort normal and breath sounds normal. No wheezing, rales or rhonchi. Abdominal: Soft, nondistended, nontender. Bowel sounds active throughout. There are no masses palpable. No hepatomegaly. Neurological: Alert and oriented to person place and time. Skin: Skin is warm and dry. No rashes noted.  Tye Savoy, NP  01/20/2021, 2:11 PM  I spent 35 minutes total reviewing records, obtaining  history, performing exam, counseling patient and documenting visit / findings.   Cc:  [name], [name], NP

## 2021-01-20 NOTE — Progress Notes (Signed)
Attending Physician's Attestation   I have reviewed the chart.   I agree with the Advanced Practitioner's note, impression, and recommendations with any updates as below.    Teng Decou Mansouraty, MD [facility] Office # [phone]  

## 2021-01-20 NOTE — Patient Instructions (Signed)
Treat sinus allergies as reordered by Dr Lucia Gaskins, Then call us in 3-4 weeks with update   If you are age 86 or older, your body mass index should be between 23-30. Your Body mass index is 28.92 kg/m. If this is out of the aforementioned range listed, please consider follow up with your Primary Care Provider.  If you are age 65 or younger, your body mass index should be between 19-25. Your Body mass index is 28.92 kg/m. If this is out of the aformentioned range listed, please consider follow up with your Primary Care Provider.   ________________________________________________________  The  GI providers would like to encourage you to use [facility] to communicate with providers for non-urgent requests or questions.  Due to long hold times on the telephone, sending your provider a message by [facility] may be a faster and more efficient way to get a response.  Please allow 48 business hours for a response.  Please remember that this is for non-urgent requests.  _______________________________________________________   I appreciate the  opportunity to care for you  Thank You   [place]

## 2021-01-22 ENCOUNTER — Non-Acute Institutional Stay: Payer: Medicare HMO | Admitting: Nurse Practitioner

## 2021-01-22 ENCOUNTER — Other Ambulatory Visit: Payer: Self-pay

## 2021-01-22 ENCOUNTER — Encounter: Payer: Self-pay | Admitting: Nurse Practitioner

## 2021-01-22 DIAGNOSIS — R1319 Other dysphagia: Secondary | ICD-10-CM

## 2021-01-22 DIAGNOSIS — K219 Gastro-esophageal reflux disease without esophagitis: Secondary | ICD-10-CM

## 2021-01-22 DIAGNOSIS — G4733 Obstructive sleep apnea (adult) (pediatric): Secondary | ICD-10-CM | POA: Diagnosis not present

## 2021-01-22 DIAGNOSIS — M5432 Sciatica, left side: Secondary | ICD-10-CM

## 2021-01-22 DIAGNOSIS — R32 Unspecified urinary incontinence: Secondary | ICD-10-CM

## 2021-01-22 DIAGNOSIS — E039 Hypothyroidism, unspecified: Secondary | ICD-10-CM

## 2021-01-22 DIAGNOSIS — M8580 Other specified disorders of bone density and structure, unspecified site: Secondary | ICD-10-CM

## 2021-01-22 DIAGNOSIS — Z8673 Personal history of transient ischemic attack (TIA), and cerebral infarction without residual deficits: Secondary | ICD-10-CM

## 2021-01-22 DIAGNOSIS — M4722 Other spondylosis with radiculopathy, cervical region: Secondary | ICD-10-CM

## 2021-01-22 DIAGNOSIS — F5101 Primary insomnia: Secondary | ICD-10-CM

## 2021-01-22 DIAGNOSIS — Z780 Asymptomatic menopausal state: Secondary | ICD-10-CM

## 2021-01-22 DIAGNOSIS — E782 Mixed hyperlipidemia: Secondary | ICD-10-CM

## 2021-01-22 DIAGNOSIS — I10 Essential (primary) hypertension: Secondary | ICD-10-CM

## 2021-01-22 DIAGNOSIS — J329 Chronic sinusitis, unspecified: Secondary | ICD-10-CM

## 2021-01-22 NOTE — Assessment & Plan Note (Signed)
MR 02/26/20, C6-C7

## 2021-01-22 NOTE — Assessment & Plan Note (Signed)
Chronic, uses Ambien, Valium

## 2021-01-22 NOTE — Assessment & Plan Note (Signed)
Esophageal dysmotility, Swallow studay 05/12/20, worked with ST 

## 2021-01-22 NOTE — Assessment & Plan Note (Signed)
saw Pulmonology.

## 2021-01-22 NOTE — Assessment & Plan Note (Signed)
Osteopenia, DEXA 12/25/20, t score -1.0,  10 years possibility major fx 11%, hip fx 2.6%. Vit D, Cal. Vit D 3 39 12/922

## 2021-01-22 NOTE — Assessment & Plan Note (Signed)
Hx of CVA, on plavix. Statin,  residual of  poor left peripheral vision  

## 2021-01-22 NOTE — Assessment & Plan Note (Signed)
Hx of chronic rhinitis, SOB, wheezing,  on Astelin, Flonase, Bronchodilator, underwent Pulmonology, ENT, Allergy evaluation, had spirometry 10/20/20, Echo 60-65% EF 04/16/20, CTA 09/01/20

## 2021-01-22 NOTE — Progress Notes (Signed)
Location:   clinic [place]   Place of Service:  Clinic (12) Provider: Marlana Latus NP  Code Status: DNR Goals of Care: [place] 01/22/2021  Does Patient Have a Medical Advance Directive? No  Type of Advance Directive -  Does patient want to make changes to medical advance directive? -  Copy of [place] in Chart? -  Would patient like information on creating a medical advance directive? -     Chief Complaint  Patient presents with   Acute Visit    Discuss medications, nasal spray, OTC allergy medication. Discuss HCPOA and Living Will. Discuss need for DEXA, td/tdap, shingrix, and covid boosters or post pone if patient refuses     HPI: Patient is a 88 y.o. female seen today for medical management of chronic diseases.    Hx of chronic rhinitis, SOB, wheezing,  on Astelin, Flonase, Bronchodilator, underwent Pulmonology, ENT, Allergy evaluation, had spirometry 10/20/20, Echo 60-65% EF 04/16/20, CTA 09/01/20        Insomnia Ambien, Valium             Hx of CVA, on plavix. Statin,  residual of  poor left peripheral vision  Hypothyroidism, on Levothyroxine, TSH 1.80 12/02/20             HTN, blood pressure is controlled on Losartan qd. Followed by Cardiology. CTA negative 09/01/20. Bun/creat 17/0.74 08/29/20            GERD, stable Omeprazole, saw GI, EGD 12/26/19. Vit B12 383 12/02/20             Esophageal dysmotility, Swallow studay 05/12/20, worked with ST             OSA sleep study, saw Pulmonology.              OA, saw Ortho.              Hyperlipidemia, takes Pravastatin, LDL 107 12/26/19             Cervical spinal stenosis, MR 02/26/20, C6-C7             Urinary frequency/leakage, seems better on Vibegron.   Osteopenia, DEXA 12/25/20, t score -1.0,  10 years possibility major fx 11%, hip fx 2.6%. Vit D, Cal-diet rich,  Vit D 3 39 12/922   Past Medical History:  Diagnosis Date   Anxiety    Atypical mole 11/11/2020   Left Inner Knee (moderate-free)    Colon polyps    Depression    Hyperlipidemia    Hypertension    Hypothyroidism    Melanoma in situ ([place]) 09/19/2018   Right Arm (excision)   Skin cancer    Sleep apnea    Stroke [facility]) 2008    Past Surgical History:  Procedure Laterality Date   ADENOIDECTOMY     APPENDECTOMY  1960   CATARACT EXTRACTION Left    COLONOSCOPY     GUM SURGERY  03/2019   LUMBAR [place] SURGERY  2014   MELANOMA EXCISION  2020   MESH APPLIED TO LAP PORT     TONSILLECTOMY  1940    Allergies  Allergen Reactions   Norco [Hydrocodone-Acetaminophen] Nausea And Vomiting   Dust Mite Extract Other (See Comments)   Oxycontin [Oxycodone Hcl] Nausea And Vomiting   Pollen Extract Other (See Comments)    Allergies as of 01/22/2021       Reactions   Norco [hydrocodone-acetaminophen] Nausea And Vomiting   Dust Mite Extract Other (See  Comments)   Oxycontin [oxycodone Hcl] Nausea And Vomiting   Pollen Extract Other (See Comments)        Medication List        Accurate as of January 22, 2021 11:59 PM. If you have any questions, ask your nurse or doctor.          STOP taking these medications    cycloSPORINE 0.05 % ophthalmic emulsion Commonly known as: RESTASIS Stopped by: Dericka Ostenson X Morayma Godown, NP   PATADAY OP Stopped by: Athziry Millican X Purvi Ruehl, NP       TAKE these medications    acetaminophen 500 MG tablet Commonly known as: TYLENOL Take 25-500 mg by mouth every 6 (six) hours as needed for mild pain or headache.   azelastine 0.1 % nasal spray Commonly known as: ASTELIN Place 1 to 2 sprays each nostril 1-2 times a day as needed for runny nose/drainage down throat   clopidogrel 75 MG tablet Commonly known as: PLAVIX TAKE 1 TABLET BY MOUTH EVERY DAY   diazepam 5 MG tablet Commonly known as: VALIUM Take 0.5 tablets (2.5 mg total) by mouth as needed (anxiety). WILL NEED APPOINTMENT FOR ADDITIONAL REFILLS   FDgard 25-20.75 MG Caps Generic drug: [place] Oil-Levomenthol Take by mouth 2 (two) times daily.    fluticasone 50 MCG/ACT nasal spray Commonly known as: FLONASE Place 1-2 sprays into both nostrils daily.   Gemtesa 75 MG Tabs Generic drug: Vibegron Take 75 mg by mouth daily.   ketoconazole 2 % shampoo Commonly known as: NIZORAL Apply 1 application topically See admin instructions. Twice weekly as needed for scalp irritation.   losartan 50 MG tablet Commonly known as: COZAAR Take 1 tablet (50 mg total) by mouth daily. Appointment due prior to additional refills   nitroGLYCERIN 0.4 MG SL tablet Commonly known as: NITROSTAT Place 1 tablet (0.4 mg total) under the tongue every 5 (five) minutes as needed for up to 25 days for chest pain.   omeprazole 40 MG capsule Commonly known as: PRILOSEC Take 40 mg by mouth daily. What changed: Another medication with the same name was removed. Continue taking this medication, and follow the directions you see here. Changed by: Ludwig Tugwell X Eilam [place], NP   pravastatin 40 MG tablet Commonly known as: PRAVACHOL Take 1 tablet (40 mg total) by mouth daily. TAKE 1 TABLET BY MOUTH EVERYDAY AT BEDTIME   sodium chloride 0.65 % Soln nasal spray Commonly known as: OCEAN Place 1 spray into both nostrils as needed for congestion.   Synthroid 100 MCG tablet Generic drug: levothyroxine TAKE 1 TABLET BY MOUTH EVERY DAY BEFORE BREAKFAST   Zolpidem Tartrate 3.5 MG Subl Take 1 tablet by mouth at bedtime as needed.        Review of Systems:  Review of Systems  Constitutional:  Negative for fatigue, fever and unexpected weight change.  HENT:  Positive for congestion, hearing loss and sinus pressure. Negative for voice change.        Chronic sinusitis, underwent ENT, sleep study-sleep apnea.   Eyes:  Positive for visual disturbance.       Left lateral visual field deficit  Respiratory:  Negative for cough and shortness of breath.   Cardiovascular:  Negative for leg swelling.  Gastrointestinal:  Negative for abdominal pain and constipation.  Genitourinary:   Positive for frequency. Negative for dysuria and urgency.       Urinary leakage. Doing Kegel exercise.   Musculoskeletal:  Negative for back pain and gait problem.  Skin:  Negative for  color change.  Neurological:  Negative for speech difficulty, weakness and headaches.  Psychiatric/Behavioral:  Positive for sleep disturbance. Negative for dysphoric mood. The patient is not nervous/anxious.        Early am awake, difficulty returning asleep.    Health Maintenance  Topic Date Due   COVID-19 Vaccine (4 - Booster for Moderna series) 07/31/2020   TETANUS/TDAP  03/18/2021 (Originally 11/26/2018)   Zoster Vaccines- Shingrix (2 of 2) 03/18/2021 (Originally 02/14/2019)   INFLUENZA VACCINE  04/17/2021 (Originally 08/18/2020)   COLONOSCOPY (Pts 45-74yrs Insurance coverage will need to be confirmed)  10/25/2021   Pneumonia Vaccine 74+ Years old  Completed   DEXA SCAN  Completed   HPV VACCINES  Aged Out    Physical Exam: Vitals:   01/22/21 1110  BP: 118/66  Pulse: 85  Temp: (!) 97.3 F (36.3 C)  TempSrc: Temporal  SpO2: 91%  Weight: 159 lb (72.1 kg)  Height: 5\' 2"  (1.575 m)   Body mass index is 29.08 kg/m. Physical Exam Vitals and nursing note reviewed.  Constitutional:      Appearance: Normal appearance.  HENT:     Head: Normocephalic and atraumatic.     Nose: Nose normal.     Mouth/Throat:     Mouth: Mucous membranes are moist.  Eyes:     Extraocular Movements: Extraocular movements intact.     Conjunctiva/sclera: Conjunctivae normal.     Pupils: Pupils are equal, round, and reactive to light.     Comments: Left peripheral visual field deficit since CVA 2006  Cardiovascular:     Rate and Rhythm: Normal rate and regular rhythm.     Heart sounds: No murmur heard. Pulmonary:     Effort: Pulmonary effort is normal.     Breath sounds: Rales present.     Comments: Bibasilar rales.  Abdominal:     General: Bowel sounds are normal.     Palpations: Abdomen is soft.     Tenderness:  There is no abdominal tenderness.  Musculoskeletal:     Cervical back: Normal range of motion and neck supple.     Right lower leg: No edema.     Left lower leg: No edema.  Skin:    General: Skin is warm and dry.     Findings: Bruising present.     Comments: Left inner thigh ecchymoses  Neurological:     General: No focal deficit present.     Mental Status: She is alert and oriented to person, place, and time. Mental status is at baseline.     Gait: Gait normal.  Psychiatric:        Mood and Affect: Mood normal.        Behavior: Behavior normal.        Thought Content: Thought content normal.        Judgment: Judgment normal.    Labs reviewed: Basic Metabolic Panel: Recent Labs    02/07/20 1756 08/29/20 0427 12/02/20 0735  NA 142 142  --   K 3.8 3.7  --   CL 109 109  --   CO2 23 26  --   GLUCOSE 115* 126*  --   BUN 15 17  --   CREATININE 0.78 0.74  --   CALCIUM 9.5 9.7  --   TSH  --   --  1.80   Liver Function Tests: No results for input(s): AST, ALT, ALKPHOS, BILITOT, PROT, ALBUMIN in the last 8760 hours. No results for input(s): LIPASE, AMYLASE in the last  8760 hours. No results for input(s): AMMONIA in the last 8760 hours. CBC: Recent Labs    02/07/20 1756 08/29/20 0427  WBC 7.3 7.5  HGB 14.3 13.2  HCT 41.9 41.0  MCV 89.5 91.3  PLT 267 253   Lipid Panel: No results for input(s): CHOL, HDL, LDLCALC, TRIG, CHOLHDL, LDLDIRECT in the last 8760 hours. Lab Results  Component Value Date   HGBA1C 6.2 (H) 01/08/2020    Procedures since last visit: No results found.  Assessment/Plan  Gastroesophageal reflux disease stable Omeprazole, saw GI, EGD 12/26/19  Dysphagia Esophageal dysmotility, Swallow studay 05/12/20, worked with ST  OSA (obstructive sleep apnea) saw Pulmonology.   Back pain with left-sided sciatica  saw Ortho.   Hyperlipidemia takes Pravastatin, LDL 107 12/26/19, due f/u lipid panel.   Other spondylosis with radiculopathy, cervical  region MR 02/26/20, C6-C7  Incontinent of urine seems better on Vibegron.   Osteopenia after menopause Osteopenia, DEXA 12/25/20, t score -1.0,  10 years possibility major fx 11%, hip fx 2.6%. Vit D, Cal. Vit D 3 39 12/922  Chronic sinusitis Hx of chronic rhinitis, SOB, wheezing,  on Astelin, Flonase, Bronchodilator, underwent Pulmonology, ENT, Allergy evaluation, had spirometry 10/20/20, Echo 60-65% EF 04/16/20, CTA 09/01/20         Insomnia Chronic, uses Ambien, Valium  History of CVA (cerebrovascular accident) Hx of CVA, on plavix. Statin,  residual of  poor left peripheral vision   Acquired hypothyroidism  on Levothyroxine, TSH 1.80 12/02/20  Essential hypertension  on Levothyroxine, TSH 1.80 12/02/20   Labs/tests ordered: none  Next appt:  04/02/2021, AWV due

## 2021-01-22 NOTE — Assessment & Plan Note (Signed)
saw Ortho.

## 2021-01-22 NOTE — Assessment & Plan Note (Signed)
on Levothyroxine, TSH 1.80 12/02/20 

## 2021-01-22 NOTE — Assessment & Plan Note (Signed)
stable Omeprazole, saw GI, EGD 12/26/19

## 2021-01-22 NOTE — Assessment & Plan Note (Signed)
takes Pravastatin, LDL 107 12/26/19, due f/u lipid panel.

## 2021-01-22 NOTE — Assessment & Plan Note (Signed)
seems better on Vibegron.

## 2021-01-26 ENCOUNTER — Encounter: Payer: Self-pay | Admitting: Nurse Practitioner

## 2021-01-31 ENCOUNTER — Other Ambulatory Visit: Payer: Self-pay | Admitting: Nurse Practitioner

## 2021-02-01 ENCOUNTER — Encounter (HOSPITAL_COMMUNITY): Payer: Self-pay | Admitting: Emergency Medicine

## 2021-02-01 ENCOUNTER — Emergency Department (HOSPITAL_COMMUNITY)
Admission: EM | Admit: 2021-02-01 | Discharge: 2021-02-01 | Disposition: A | Discharge status: Home / Self Care | Payer: Medicare HMO | Attending: Emergency Medicine | Admitting: Emergency Medicine

## 2021-02-01 DIAGNOSIS — L299 Pruritus, unspecified: Secondary | ICD-10-CM | POA: Diagnosis not present

## 2021-02-01 DIAGNOSIS — H5789 Other specified disorders of eye and adnexa: Secondary | ICD-10-CM | POA: Diagnosis present

## 2021-02-01 DIAGNOSIS — L03213 Periorbital cellulitis: Secondary | ICD-10-CM | POA: Insufficient documentation

## 2021-02-01 MED ORDER — OFLOXACIN 0.3 % OP SOLN: 1.0000 [drp] | Freq: Four times a day (QID) | OPHTHALMIC | 0 refills | Status: AC

## 2021-02-01 MED ORDER — CEPHALEXIN 500 MG PO CAPS: ORAL_CAPSULE | ORAL | 0 refills | Status: DC

## 2021-02-01 NOTE — ED Provider Notes (Signed)
[facility] DEPT Provider Note   CSN: [id number] Arrival date & time: 02/01/21  1108     History  Chief Complaint  Patient presents with   Eye Problem    Darlene [name] is a 88 y.o. femalewho has chronic sinusitis and dry eyes. She presents to the ED with a complaint of redness/swelling surrounding the inferior right eye for 5 days. Redness and swelling has progressively worsened. She now has significant pain. She has associated pruritus. Patient saw her PCP on Thursday and was placed on benadryl and Pataday for presumed allergies. She has no known hx of Sjogren's  but states she has never been evaluated for this disorder.   She denies pain with eye movement, vision changes, dental pain or fever.   Eye Problem     Home Medications Prior to Admission medications   Medication Sig Start Date End Date Taking? Authorizing Provider  acetaminophen (TYLENOL) 500 MG tablet Take 25-500 mg by mouth every 6 (six) hours as needed for mild pain or headache.    [provider]  azelastine (ASTELIN) 0.1 % nasal spray Place 1 to 2 sprays each nostril 1-2 times a day as needed for runny nose/drainage down throat 07/31/20   Althea Charon, FNP  [place] Oil-Levomenthol (FDGARD) 25-20.75 MG CAPS Take by mouth 2 (two) times daily.    [provider]  clopidogrel (PLAVIX) 75 MG tablet TAKE 1 TABLET BY MOUTH EVERY DAY 08/06/20   [name], [name], NP  diazepam (VALIUM) 5 MG tablet Take 0.5 tablets (2.5 mg total) by mouth as needed (anxiety). WILL NEED APPOINTMENT FOR ADDITIONAL REFILLS 10/30/20   [name], [name], NP  fluticasone (FLONASE) 50 MCG/ACT nasal spray Place 1-2 sprays into both nostrils daily. 07/31/20   Althea Charon, FNP  GEMTESA 75 MG TABS Take 75 mg by mouth daily. 07/18/20   [provider]  ketoconazole (NIZORAL) 2 % shampoo Apply 1 application topically See admin instructions. Twice weekly as needed for scalp irritation. 10/20/20   Sheffield, Ronalee Red, PA-C  losartan (COZAAR) 50 MG tablet Take 1 tablet (50 mg total) by mouth daily. Appointment due prior to additional refills 11/04/20   [name], [name], NP  nitroGLYCERIN (NITROSTAT) 0.4 MG SL tablet Place 1 tablet (0.4 mg total) under the tongue every 5 (five) minutes as needed for up to 25 days for chest pain. 04/04/20 10/19/22  Adrian Prows, MD  omeprazole (PRILOSEC) 40 MG capsule Take 40 mg by mouth daily.    [provider]  pravastatin (PRAVACHOL) 40 MG tablet Take 1 tablet (40 mg total) by mouth daily. TAKE 1 TABLET BY MOUTH EVERYDAY AT BEDTIME 09/04/20   Cantwell, Celeste C, PA-C  sodium chloride (OCEAN) 0.65 % SOLN nasal spray Place 1 spray into both nostrils as needed for congestion.    [provider]  SYNTHROID 100 MCG tablet TAKE 1 TABLET BY MOUTH EVERY DAY BEFORE BREAKFAST 11/04/20   [name], [name], NP  Zolpidem Tartrate 3.5 MG SUBL Take 1 tablet by mouth at bedtime as needed. 12/26/20   [name], [name], NP      Allergies    Norco [hydrocodone-acetaminophen], Dust mite extract, Oxycontin [oxycodone hcl], and Pollen extract    Review of Systems   Review of Systems As per HPI Physical Exam Updated Vital Signs BP (!) 149/74 (BP Location: Right Arm)    Pulse 94    Temp 98.2 F (36.8 C) (Oral)    Resp 16    SpO2 98%  Physical Exam Vitals and nursing note reviewed.  Constitutional:      General: She is not in acute distress.    Appearance: She is well-developed. She is not diaphoretic.  HENT:     Head: Normocephalic and atraumatic.     Right Ear: External ear normal.     Left Ear: External ear normal.     Nose:     Comments: Dry nasal tissue    Mouth/Throat:     Mouth: Mucous membranes are dry.  Eyes:     Comments: Vision is grossly intact.  Patient has mild mattering of the right eyelashes.  There is an significant injected conjunctivae bilaterally.  She has erythema of the right lower eyelid with some swelling and tenderness centrally consistent with hordeolum.  There  is a small cut in one of the inferior eyelid folds and significant swelling tenderness and erythema in the infraorbital region to the zygoma.  There is no well demarcated border raised tissue.  No pain with eye movement  Cardiovascular:     Rate and Rhythm: Normal rate and regular rhythm.     Heart sounds: Normal heart sounds. No murmur heard.   No friction rub. No gallop.  Pulmonary:     Effort: Pulmonary effort is normal. No respiratory distress.     Breath sounds: Normal breath sounds.  Abdominal:     General: Bowel sounds are normal. There is no distension.     Palpations: Abdomen is soft. There is no mass.     Tenderness: There is no abdominal tenderness. There is no guarding.  Musculoskeletal:     Cervical back: Normal range of motion.  Skin:    General: Skin is warm and dry.  Neurological:     Mental Status: She is alert and oriented to person, place, and time.  Psychiatric:        Behavior: Behavior normal.    ED Results / Procedures / Treatments   Labs (all labs ordered are listed, but only abnormal results are displayed) Labs Reviewed - No data to display  EKG None  Radiology No results found.  Procedures Procedures  {  Medications Ordered in ED Medications - No data to display  ED Course/ Medical Decision Making/ A&P                           Medical Decision Making  This is an 88 year old female with a past medical history of chronic dry eyes.  I have concerned that she could have Sjogren's syndrome and have suggested that she talk to her PCP about this.  Patient does appear to have a right-sided preseptal cellulitis.  No evidence of orbital infection as he does not have pain with eye movement.  Patient has chronic lash mattering and dry eyes but will cover with ofloxacin drops.  Patient will be placed on Keflex for skin and soft tissue infections.  This does not appear consistent with severe dental infections, sinusitis, blocked tear duct or  erysipelas. Social determinants of health include that the patient has good PCP follow-up. Final Clinical Impression(s) / ED Diagnoses Final diagnoses:  Preseptal cellulitis of right lower eyelid    Rx / DC Orders ED Discharge Orders     None         Margarita Mail, PA-C 02/01/21 1347    Isla Pence, MD 02/01/21 1441

## 2021-02-01 NOTE — ED Triage Notes (Signed)
Patient c/o redness, swelling, and drainage of R eye x5 days. States now spreading to L. Seen at PCP and told allergy related. Denies vision changes.

## 2021-02-01 NOTE — Discharge Instructions (Addendum)
Get help right away if: You have new symptoms. Your symptoms get worse or do not get better with treatment. You have a fever. Your vision becomes blurry or gets worse in any way. Your eye looks like it is sticking out or bulging out (proptosis). You develop double vision. You have trouble moving your eyes or pain when moving your eyes You have a severe headache. You have neck stiffness or severe neck pain.

## 2021-02-02 NOTE — Telephone Encounter (Signed)
Patient has request refill on medication "Plavix". Patient last refill was in July 2022. Patient medication has High Risk Warnings. Medication pend and sent to PCP [name], [name], NP for approval. Please Advise.

## 2021-02-26 ENCOUNTER — Encounter: Payer: Medicare HMO | Admitting: Nurse Practitioner

## 2021-02-28 ENCOUNTER — Other Ambulatory Visit: Payer: Self-pay | Admitting: Physician Assistant

## 2021-03-05 ENCOUNTER — Non-Acute Institutional Stay (INDEPENDENT_AMBULATORY_CARE_PROVIDER_SITE_OTHER): Payer: Medicare HMO | Admitting: Nurse Practitioner

## 2021-03-05 ENCOUNTER — Encounter: Payer: Self-pay | Admitting: Nurse Practitioner

## 2021-03-05 ENCOUNTER — Other Ambulatory Visit: Payer: Self-pay

## 2021-03-05 VITALS — BP 130/70 | HR 60 | Ht 62.0 in | Wt 159.2 lb

## 2021-03-05 DIAGNOSIS — Z0000 Encounter for general adult medical examination without abnormal findings: Secondary | ICD-10-CM | POA: Diagnosis not present

## 2021-03-05 NOTE — Progress Notes (Signed)
Subjective:   Darlene [name] is a 88 y.o. female who presents for Medicare Annual (Subsequent) preventive examination at [facility] Cardiac Risk Factors include: advanced age (>4men, >44 women);hypertension     Objective:    Today's Vitals   03/05/21 1522  BP: 130/70  Pulse: 60  SpO2: 97%  Weight: 159 lb 3.2 oz (72.2 kg)  Height: 5\' 2"  (1.575 m)   Body mass index is 29.12 kg/m.  Advanced Directives 01/22/2021 11/27/2020 11/26/2020 04/07/2020 04/07/2019 02/20/2019  Does Patient Have a Medical Advance Directive? No Yes Yes No No Yes  Type of Advance Directive - Healthcare Power of Attorney Living will [place];Living will  Does patient want to make changes to medical advance directive? - No - Patient declined No - Patient declined - - No - Patient declined  Copy of [place] in Chart? - Yes - validated most recent copy scanned in chart (See row information) - - - -  Would patient like information on creating a medical advance directive? - - - No - Patient declined No - Patient declined -    Current Medications (verified) Outpatient Encounter Medications as of 03/05/2021  Medication Sig   acetaminophen (TYLENOL) 500 MG tablet Take 25-500 mg by mouth every 6 (six) hours as needed for mild pain or headache.   azelastine (ASTELIN) 0.1 % nasal spray Place 1 to 2 sprays each nostril 1-2 times a day as needed for runny nose/drainage down throat   Caraway Oil-Levomenthol (FDGARD) 25-20.75 MG CAPS Take by mouth 2 (two) times daily.   cephALEXin (KEFLEX) 500 MG capsule 2 caps po bid x 7 days   clopidogrel (PLAVIX) 75 MG tablet TAKE 1 TABLET BY MOUTH EVERY DAY   diazepam (VALIUM) 5 MG tablet Take 0.5 tablets (2.5 mg total) by mouth as needed (anxiety). WILL NEED APPOINTMENT FOR ADDITIONAL REFILLS   fluticasone (FLONASE) 50 MCG/ACT nasal spray Place 1-2 sprays into both nostrils daily.   GEMTESA 75 MG TABS Take 75 mg by mouth daily.   ketoconazole  (NIZORAL) 2 % shampoo APPLY 1 APPLICATION TOPICALLY TWICE WEEKLY AS NEEDED FOR SCALP IRRITATION.   losartan (COZAAR) 50 MG tablet Take 1 tablet (50 mg total) by mouth daily. Appointment due prior to additional refills   nitroGLYCERIN (NITROSTAT) 0.4 MG SL tablet Place 1 tablet (0.4 mg total) under the tongue every 5 (five) minutes as needed for up to 25 days for chest pain.   omeprazole (PRILOSEC) 40 MG capsule Take 40 mg by mouth daily.   pravastatin (PRAVACHOL) 40 MG tablet Take 1 tablet (40 mg total) by mouth daily. TAKE 1 TABLET BY MOUTH EVERYDAY AT BEDTIME   sodium chloride (OCEAN) 0.65 % SOLN nasal spray Place 1 spray into both nostrils as needed for congestion.   SYNTHROID 100 MCG tablet TAKE 1 TABLET BY MOUTH EVERY DAY BEFORE BREAKFAST   Zolpidem Tartrate 3.5 MG SUBL Take 1 tablet by mouth at bedtime as needed.   No facility-administered encounter medications on file as of 03/05/2021.    Allergies (verified) Norco [hydrocodone-acetaminophen], Dust mite extract, Oxycontin [oxycodone hcl], and Pollen extract   History: Past Medical History:  Diagnosis Date   Anxiety    Atypical mole 11/11/2020   Left Inner Knee (moderate-free)   Colon polyps    Depression    Hyperlipidemia    Hypertension    Hypothyroidism    Melanoma in situ ([place]) 09/19/2018   Right Arm (excision)   Skin cancer  Sleep apnea    Stroke [facility]) 2008   Past Surgical History:  Procedure Laterality Date   ADENOIDECTOMY     APPENDECTOMY  1960   CATARACT EXTRACTION Left    COLONOSCOPY     GUM SURGERY  03/2019   LUMBAR [place] SURGERY  2014   MELANOMA EXCISION  2020   MESH APPLIED TO LAP PORT     TONSILLECTOMY  1940   Family History  Problem Relation Age of Onset   Lung cancer Mother 19       smoker   Heart disease Mother    Stroke Father 38   Heart attack Father    Allergic rhinitis Sister    Hypertension Sister    CVA Brother    Lung cancer Maternal Grandfather    Heart attack Paternal Grandfather     Melanoma Daughter    Psoriasis Daughter    Rheum arthritis Daughter    Bipolar disorder Daughter    Colon cancer Neg Hx    Esophageal cancer Neg Hx    Inflammatory bowel disease Neg Hx    Liver disease Neg Hx    Pancreatic cancer Neg Hx    Stomach cancer Neg Hx    Rectal cancer Neg Hx    Social History   Socioeconomic History   Marital status: Widowed    Spouse name: Not on file   Number of children: 3   Years of education: Not on file   Highest education level: Not on file  Occupational History   Occupation: retired Radiographer, therapeutic  Tobacco Use   Smoking status: Never   Smokeless tobacco: Never  Vaping Use   Vaping Use: Never used  Substance and Sexual Activity   Alcohol use: Yes    Comment: hardly ever   Drug use: Never   Sexual activity: Not Currently  Other Topics Concern   Not on file  Social History Narrative   Social History      Diet? ok      Do you drink/eat things with caffeine? Weak coffee, (much milk)      Marital status?          divorced                          What year were you married? 1961      Do you live in a house, apartment, assisted living, condo, trailer, etc.? apartment      Is it one or more stories? 4      How many persons live in your home? Only me      Do you have any pets in your home? (please list) no      Highest level of education completed? 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             yes                         Type & how often? Walk- would love to return to water exercise      Advanced Directives      Do you have a living will? yes      Do you have a DNR form?           no  If not, do you want to discuss one? no      Do you have signed POA/HPOA for forms? no      Functional Status      Do you have difficulty bathing or dressing yourself?  no      Do you have difficulty preparing food or eating? no       Do you have difficulty managing your medications? no      Do you have difficulty managing your finances?  no      Do you have difficulty affording your medications? no      Social Determinants of Health   Financial Resource Strain: Not on file  Food Insecurity: Not on file  Transportation Needs: Not on file  Physical Activity: Not on file  Stress: Not on file  Social Connections: Not on file    Tobacco Counseling Counseling given: Not Answered   Clinical Intake:  Pre-visit preparation completed: Yes  Pain : No/denies pain     Diabetes: No (prediebetes)  How often do you need to have someone help you when you read instructions, pamphlets, or other written materials from your doctor or pharmacy?: 1 - Never What is the last grade level you completed in school?: 2 master degrees  Diabetic?prediabetes  Interpreter Needed?: No  Information entered by :: Tashiba Timoney Bretta Bang NP   Activities of Daily Living In your present state of health, do you have any difficulty performing the following activities: 03/05/2021  Hearing? N  Vision? N  Difficulty concentrating or making decisions? N  Walking or climbing stairs? N  Dressing or bathing? N  Doing errands, shopping? N  Preparing Food and eating ? N  Using the Toilet? N  In the past six months, have you accidently leaked urine? N  Do you have problems with loss of bowel control? N  Managing your Medications? N  Managing your Finances? N  Housekeeping or managing your Housekeeping? N  Some recent data might be hidden    Patient Care Team: Lorn Butcher X, NP as PCP - General (Internal Medicine) Clark-Burning, Anderson Malta, PA-C (Inactive) (Dermatology) Adrian Prows, MD as Consulting Physician (Cardiology) Mansouraty, Telford Nab., MD as Consulting Physician (Gastroenterology) Rozetta Nunnery, MD (Inactive) as Consulting Physician (Otolaryngology) Warren Danes, PA-C as Physician Assistant (Dermatology)  Indicate  any recent Medical Services you may have received from other than [facility] providers in the past year (date may be approximate).     Assessment:   This is a routine wellness examination for [facility].  Hearing/Vision screen Hearing Screening - Comments:: No concerns for hearing Vision Screening - Comments:: Wear glasses  Dietary issues and exercise activities discussed: Current Exercise Habits: Home exercise routine;Structured exercise class, Type of exercise: strength training/weights;stretching;walking, Time (Minutes): 35, Frequency (Times/Week): 6, Weekly Exercise (Minutes/Week): 210, Intensity: Moderate, Exercise limited by: cardiac condition(s)   Goals Addressed             This Visit's Progress    Activity and Exercise Increased       Evidence-based guidance:  Review current exercise levels.  Assess patient perspective on exercise or activity level, barriers to increasing activity, motivation and readiness for change.  Recommend or set healthy exercise goal based on individual tolerance.  Encourage small steps toward making change in amount of exercise or activity.  Urge reduction of sedentary activities or screen time.  Promote group activities within the community or with family or support person.  Consider referral to rehabiliation therapist for assessment and exercise/activity  plan.   Notes:        Depression Screen PHQ 2/9 Scores 01/22/2021 11/26/2019 08/24/2019 08/24/2019  PHQ - 2 Score - 3 1 0  PHQ- 9 Score - 9 5 -  Exception Documentation Other- indicate reason in comment box - - -  Not completed Patient is under the care of a therapist, states "All my life" - - -    Fall Risk Fall Risk  03/05/2021 01/22/2021 11/27/2020 04/11/2020 11/29/2019  Falls in the past year? 0 0 0 0 0  Number falls in past yr: 0 0 0 0 0  Injury with Fall? 0 0 0 - -  Comment - - - - -  Risk for fall due to : No Fall Risks No Fall Risks No Fall Risks - -  Follow up Falls evaluation  completed;Education provided;Falls prevention discussed Falls evaluation completed Falls evaluation completed - -    FALL RISK PREVENTION PERTAINING TO THE HOME:  Any stairs in or around the home? Yes  If so, are there any without handrails? No  Home free of loose throw rugs in walkways, pet beds, electrical cords, etc? Yes  Adequate lighting in your home to reduce risk of falls? Yes   ASSISTIVE DEVICES UTILIZED TO PREVENT FALLS:  Life alert? Yes  Use of a cane, walker or w/c? No  Grab bars in the bathroom? Yes  Shower chair or bench in shower? No  Elevated toilet seat or a handicapped toilet? No   TIMED UP AND GO:  Was the test performed? No .    Gait steady and fast without use of assistive device  Cognitive Function: MMSE - Mini Mental State Exam 03/05/2021  Orientation to time 5  Orientation to Place 5  Registration 3  Attention/ Calculation 5  Recall 2  Language- name 2 objects 2  Language- repeat 1  Language- follow 3 step command 3  Language- read & follow direction 1  Write a sentence 1  Copy design 1  Total score 29        Immunizations Immunization History  Administered Date(s) Administered   Hepatitis A 04/04/2000   Influenza-Unspecified 10/18/2014   Moderna Sars-Covid-2 Vaccination 01/22/2019, 02/19/2019, 06/05/2020   Pneumococcal Conjugate-13 12/20/2018   Pneumococcal Polysaccharide-23 01/18/2005   Tdap 11/25/2008   Zoster Recombinat (Shingrix) 12/20/2018    Shingrix 2nd dose due    Qualifies for Shingles Vaccine? Yes   Zostavax completed Yes   Shingrix Completed?: No.    Education has been provided regarding the importance of this vaccine. Patient has been advised to call insurance company to determine out of pocket expense if they have not yet received this vaccine. Advised may also receive vaccine at local pharmacy or Health Dept. Verbalized acceptance and understanding.  Screening Tests Health Maintenance  Topic Date Due   COVID-19  Vaccine (4 - Booster for Moderna series) 07/31/2020   TETANUS/TDAP  03/18/2021 (Originally 11/26/2018)   Zoster Vaccines- Shingrix (2 of 2) 03/18/2021 (Originally 02/14/2019)   INFLUENZA VACCINE  04/17/2021 (Originally 08/18/2020)   COLONOSCOPY (Pts 45-28yrs Insurance coverage will need to be confirmed)  10/25/2021   Pneumonia Vaccine 97+ Years old  Completed   DEXA SCAN  Completed   HPV VACCINES  Aged Out    Health Maintenance  Health Maintenance Due  Topic Date Due   COVID-19 Vaccine (4 - Booster for Moderna series) 07/31/2020    Colorectal cancer screening: No longer required.   Mammogram status: No longer required due to aged out.  Bone Density status: Completed  . Results reflect: Bone density results: OSTEOPENIA. Repeat every 2-3 years.  Lung Cancer Screening: (Low Dose CT Chest recommended if Age 73-80 years, 30 pack-year currently smoking OR have quit w/in 15years.) does not qualify.     Additional Screening:  Hepatitis C Screening: does not qualify;  Vision Screening: Recommended annual ophthalmology exams for early detection of glaucoma and other disorders of the eye. Is the patient up to date with their annual eye exam?  Yes  Who is the provider or what is the name of the office in which the patient attends annual eye exams? [facility].  If pt is not established with a provider, would they like to be referred to a provider to establish care? No .   Dental Screening: Recommended annual dental exams for proper oral hygiene  Community Resource Referral / Chronic Care Management: CRR required this visit?  No   CCM required this visit?  No      Plan:    Will obtain the 2nd dose of Singrix.   I have personally reviewed and noted the following in the patients chart:   Medical and social history Use of alcohol, tobacco or illicit drugs  Current medications and supplements including opioid prescriptions.  Functional ability and status Nutritional  status Physical activity Advanced directives List of other physicians Hospitalizations, surgeries, and ER visits in previous 12 months Vitals Screenings to include cognitive, depression, and falls Referrals and appointments  In addition, I have reviewed and discussed with patient certain preventive protocols, quality metrics, and best practice recommendations. A written personalized care plan for preventive services as well as general preventive health recommendations were provided to patient.     Sadako Cegielski X Omara Alcon, NP   03/05/2021

## 2021-03-12 ENCOUNTER — Ambulatory Visit: Payer: Medicare HMO | Admitting: Allergy

## 2021-03-12 ENCOUNTER — Other Ambulatory Visit: Payer: Self-pay

## 2021-03-12 ENCOUNTER — Encounter: Payer: Self-pay | Admitting: Allergy

## 2021-03-12 VITALS — BP 126/74 | HR 74 | Temp 98.4°F | Resp 18 | Ht 62.0 in | Wt 158.5 lb

## 2021-03-12 DIAGNOSIS — R062 Wheezing: Secondary | ICD-10-CM | POA: Diagnosis not present

## 2021-03-12 DIAGNOSIS — J3089 Other allergic rhinitis: Secondary | ICD-10-CM | POA: Diagnosis not present

## 2021-03-12 DIAGNOSIS — J302 Other seasonal allergic rhinitis: Secondary | ICD-10-CM

## 2021-03-12 DIAGNOSIS — H5789 Other specified disorders of eye and adnexa: Secondary | ICD-10-CM

## 2021-03-12 NOTE — Patient Instructions (Addendum)
1. Seasonal and perennial allergic rhinitis (trees, weeds, grasses, indoor molds, outdoor molds, dust mites, cat, dog and cockroach) - Continue with: fluticasone nasal spray 1-2 sprays each nostril once a day.In the right nostril, point the applicator out toward the right ear. In the left nostril, point the applicator out toward the left ear  -Use Astelin (azelastine) 2 sprays per nostril twice a day at this time   2. Possible asthma/wheeze - Daily controller medication(s): none - Prior to physical activity: albuterol 2 puffs 10-15 minutes before physical activity. - Rescue medications: albuterol 4 puffs every 4-6 hours as needed - Asthma control goals: * Full participation in all desired activities (may need albuterol before activity) * Albuterol use two time or less a week on average (not counting use with activity) * Cough interfering with sleep two time or less a month * Oral steroids no more than once a year * No hospitalizations   3.  Eye issues - Recommend contacting your eye doctor to discuss your concerns regarding eye lash and tear duct plug  Please let us know if this treatment plan is not working well for you. Schedule a follow up appointment in 4-6 months or sooner if needed.

## 2021-03-12 NOTE — Progress Notes (Signed)
Follow-up Note  RE: Darlene [name] MRN: [id number] DOB: [birth year] Date of Office Visit: 03/12/2021   History of present illness: Darlene [name] is a 88 y.o. female presenting today for follow-up of allergic rhinitis and wheeze.  She was last seen in the office on 10/20/20 by our nurse practitioner Quita Skye.   For the past 2 years or so she states she has been having sinus issues.  She states she had dental work and overheard the periodentist say "I came close to that sinus didn't I" and ever since she states has had issues with her sinuses.  She was seeing Dr. Lucia Gaskins in ENT until he retired.   She states she is breathing in a lot of dust at her home and showed me a picture of her flooring with a lot of white spec particles she states is dust.  She states she has a lot of nasal congestion as well as nasal drainage.  About 5-6 weeks ago she has been having issues with her eyes, R worse than L.  She states she developed pink/red area all around her right eye and she was prescibed an antibiotic eye drop.  She then went on a trip to [place] and she states her eyes and sinus symptoms were much better and almost non-existent.  When she returned back to [place] she states she started having issues with the left eye and increase in nasal congestion.   She does use Astelin at night and the flonase in the day.  She also will use Ayr gel as well to keep nose moisturized.  Patient is helping I have some recommendations to help with her eye issues.  She is not sure if the tear duct plug she had in place has moved or not.  She does have an eye doctor. Antihistamines typically make her drowsy and singulair in the past has not been effective.  She has an albuterol inhaler however she states it doesn't seem to help so she doesn't really use it.   She is currently finishing a course of amoxicillin for dental work.   She does follow with Dr. Ander Slade in pulmonology for OSA.   Review of systems: Review of Systems   Constitutional: Negative.   HENT: Negative.    Eyes:  Positive for itching.  Respiratory: Negative.    Cardiovascular: Negative.   Gastrointestinal: Negative.   Musculoskeletal: Negative.   Skin: Negative.   Allergic/Immunologic: Negative.   Neurological: Negative.     All other systems negative unless noted above in HPI  Past medical/social/surgical/family history have been reviewed and are unchanged unless specifically indicated below.  No changes  Medication List: Current Outpatient Medications  Medication Sig Dispense Refill   acetaminophen (TYLENOL) 500 MG tablet Take 25-500 mg by mouth every 6 (six) hours as needed for mild pain or headache.     azelastine (ASTELIN) 0.1 % nasal spray Place 1 to 2 sprays each nostril 1-2 times a day as needed for runny nose/drainage down throat 30 mL 5   Caraway Oil-Levomenthol (FDGARD) 25-20.75 MG CAPS Take by mouth 2 (two) times daily.     clopidogrel (PLAVIX) 75 MG tablet TAKE 1 TABLET BY MOUTH EVERY DAY 90 tablet 1   fluticasone (FLONASE) 50 MCG/ACT nasal spray Place 1-2 sprays into both nostrils daily. 16 g 5   GEMTESA 75 MG TABS Take 75 mg by mouth daily.     ketoconazole (NIZORAL) 2 % shampoo APPLY 1 APPLICATION TOPICALLY TWICE WEEKLY AS NEEDED FOR SCALP IRRITATION.  120 mL 1   losartan (COZAAR) 50 MG tablet Take 1 tablet (50 mg total) by mouth daily. Appointment due prior to additional refills 90 tablet 1   nitroGLYCERIN (NITROSTAT) 0.4 MG SL tablet Place 1 tablet (0.4 mg total) under the tongue every 5 (five) minutes as needed for up to 25 days for chest pain. 25 tablet 3   omeprazole (PRILOSEC) 40 MG capsule Take 40 mg by mouth daily.     pravastatin (PRAVACHOL) 40 MG tablet Take 1 tablet (40 mg total) by mouth daily. TAKE 1 TABLET BY MOUTH EVERYDAY AT BEDTIME 90 tablet 2   sodium chloride (OCEAN) 0.65 % SOLN nasal spray Place 1 spray into both nostrils as needed for congestion.     SYNTHROID 100 MCG tablet TAKE 1 TABLET BY MOUTH EVERY  DAY BEFORE BREAKFAST 90 tablet 1   Zolpidem Tartrate 3.5 MG SUBL Take 1 tablet by mouth at bedtime as needed. 30 tablet 5   cephALEXin (KEFLEX) 500 MG capsule 2 caps po bid x 7 days (Patient not taking: Reported on 03/12/2021) 28 capsule 0   diazepam (VALIUM) 5 MG tablet Take 0.5 tablets (2.5 mg total) by mouth as needed (anxiety). WILL NEED APPOINTMENT FOR ADDITIONAL REFILLS (Patient not taking: Reported on 03/12/2021) 15 tablet 0   No current facility-administered medications for this visit.     Known medication allergies: Allergies  Allergen Reactions   Norco [Hydrocodone-Acetaminophen] Nausea And Vomiting   Dust Mite Extract Other (See Comments)   Oxycontin [Oxycodone Hcl] Nausea And Vomiting   Pollen Extract Other (See Comments)     Physical examination: Blood pressure 126/74, pulse 74, temperature 98.4 F (36.9 C), resp. rate 18, height 5\' 2"  (1.575 m), weight 158 lb 8 oz (71.9 kg), SpO2 97 %.  General: Alert, interactive, in no acute distress. HEENT: PERRLA, TMs pearly gray, turbinates minimally edematous without discharge, post-pharynx non erythematous. Neck: Supple without lymphadenopathy. Lungs: Clear to auscultation without wheezing, rhonchi or rales. {no increased work of breathing. CV: Normal S1, S2 without murmurs. Abdomen: Nondistended, nontender. Skin: Warm and dry, without lesions or rashes. Extremities:  No clubbing, cyanosis or edema. Neuro:   Grossly intact.  Diagnositics/Labs: None today   Assessment and plan:   1. Seasonal and perennial allergic rhinitis (trees, weeds, grasses, indoor molds, outdoor molds, dust mites, cat, dog and cockroach) - Continue with: fluticasone nasal spray 1-2 sprays each nostril once a day.In the right nostril, point the applicator out toward the right ear. In the left nostril, point the applicator out toward the left ear  -Use Astelin (azelastine) 2 sprays per nostril twice a day at this time   2. Possible asthma/wheeze -  Daily controller medication(s): none - Prior to physical activity: albuterol 2 puffs 10-15 minutes before physical activity. - Rescue medications: albuterol 4 puffs every 4-6 hours as needed - Asthma control goals: * Full participation in all desired activities (may need albuterol before activity) * Albuterol use two time or less a week on average (not counting use with activity) * Cough interfering with sleep two time or less a month * Oral steroids no more than once a year * No hospitalizations   3.  Eye issues - Recommend contacting your eye doctor to discuss your concerns regarding eye lash and tear duct plug  Please let us know if this treatment plan is not working well for you. Schedule a follow up appointment in 4-6 months or sooner if needed.   Total of 30 minutes, greater than  50% of which was spent in discussion of treatment and management options.   I appreciate the opportunity to take part in Melinda's care. Please do not hesitate to contact me with questions.  Sincerely,   Prudy Feeler, MD Allergy/Immunology [facility]

## 2021-03-19 ENCOUNTER — Other Ambulatory Visit: Payer: Self-pay | Admitting: *Deleted

## 2021-03-19 ENCOUNTER — Telehealth: Payer: Self-pay | Admitting: Nurse Practitioner

## 2021-03-19 MED ORDER — FLUTICASONE PROPIONATE 50 MCG/ACT NA SUSP: 1.0000 | Freq: Every day | NASAL | 5 refills | Status: DC

## 2021-03-19 NOTE — Telephone Encounter (Signed)
Pt called to ask if she could get a referral for ENT since her ENT Dr Lucia Gaskins retired last year. ? ?Thanks, Vilinda Blanks ?

## 2021-03-19 NOTE — Telephone Encounter (Signed)
Patient requested refill.  Pended Rx and sent to [facility] for approval.  

## 2021-03-23 ENCOUNTER — Ambulatory Visit: Payer: Medicare HMO | Admitting: Nurse Practitioner

## 2021-03-23 ENCOUNTER — Encounter: Payer: Self-pay | Admitting: Nurse Practitioner

## 2021-03-23 VITALS — BP 138/70 | HR 77 | Wt 154.4 lb

## 2021-03-23 DIAGNOSIS — J312 Chronic pharyngitis: Secondary | ICD-10-CM

## 2021-03-23 NOTE — Progress Notes (Deleted)
? ? ? ?ASSESSMENT AND PLAN   ? ? ? ?HISTORY OF PRESENT ILLNESS   ? ?Chief Complaint : ? ?Darlene [name] is a 88 y.o. female with a past medical history of for CVA (on Plavix), atrial fibrillation, anxiety/depression, sleep apnea, colon polyps (unknown pathology), status post appendectomy, seborrheic keratoses (seen dermatology in the past), chronic sore throat, esophagitis, hiatal hernia, diverticulosis, hemorrhoids, colonic AVM, adenomatous colon polyps..  Additional medical history as listed in [place] .  ? ?Patient is known to Dr.  Rush Landmark  ? ? ?LABORATORY DATA ? ?Hepatic Function Latest Ref Rng & Units 12/26/2019 09/20/2019 08/24/2019  ?Total Protein 6.1 - 8.1 g/dL 7.0 7.3 6.8  ?Albumin 3.5 - 5.0 g/dL - 4.4 -  ?AST 10 - 35 U/L _0 ?ALT 6 - 29 U/L _1 ?Alk Phosphatase 38 - 126 U/L - 80 -  ?Total Bilirubin 0.2 - 1.2 mg/dL 0.5 0.4 0.4  ? ? ?CBC Latest Ref Rng & Units 08/29/2020 02/07/2020 12/26/2019  ?WBC 4.0 - 10.5 K/uL 7.5 7.3 6.5  ?Hemoglobin 12.0 - 15.0 g/dL 13.2 14.3 14.5  ?Hematocrit 36.0 - 46.0 % 41.0 41.9 43.3  ?Platelets 150 - 400 K/uL 253 267 263  ? ? ?_2  ? ? ? ?PREVIOUS GI EVALUATIONS:  ? ?Endoscopies:  ? ? ? ? ?Imaging:  ? ? ? ?Labs:  ? ? ? ?PREVIOUS ENDOSCOPIC EVALUATIONS   ? ? ? ? ? ?Past Medical History:  ?Diagnosis Date  ? Anxiety   ? Atypical mole 11/11/2020  ? Left Inner Knee (moderate-free)  ? Colon polyps   ? Depression   ? Hyperlipidemia   ? Hypertension   ? Hypothyroidism   ? Melanoma in situ ([place]) 09/19/2018  ? Right Arm (excision)  ? Skin cancer   ? Sleep apnea   ? Stroke [facility]) 2008  ? ? ?Past Surgical History:  ?Procedure Laterality Date  ? ADENOIDECTOMY    ? APPENDECTOMY  1960  ? CATARACT EXTRACTION Left   ? COLONOSCOPY    ? GUM SURGERY  03/2019  ? [place] SURGERY  2014  ? MELANOMA EXCISION  2020  ? MESH APPLIED TO LAP PORT    ? TONSILLECTOMY  1940  ? ? ? ? ?Current Medications, Allergies, Family History and Social History were reviewed in [employer]  record. ?  ?  ?Current Outpatient Medications  ?Medication Sig Dispense Refill  ? acetaminophen (TYLENOL) 500 MG tablet Take 25-500 mg by mouth every 6 (six) hours as needed for mild pain or headache.    ? azelastine (ASTELIN) 0.1 % nasal spray Place 1 to 2 sprays each nostril 1-2 times a day as needed for runny nose/drainage down throat 30 mL 5  ? [place] Oil-Levomenthol (FDGARD) 25-20.75 MG CAPS Take by mouth 2 (two) times daily.    ? cephALEXin (KEFLEX) 500 MG capsule 2 caps po bid x 7 days (Patient not taking: Reported on 03/12/2021) 28 capsule 0  ? clopidogrel (PLAVIX) 75 MG tablet TAKE 1 TABLET BY MOUTH EVERY DAY 90 tablet 1  ? diazepam (VALIUM) 5 MG tablet Take 0.5 tablets (2.5 mg total) by mouth as needed (anxiety). WILL NEED APPOINTMENT FOR ADDITIONAL REFILLS (Patient not taking: Reported on 03/12/2021) 15 tablet 0  ? fluticasone (FLONASE) 50 MCG/ACT nasal spray Place 1-2 sprays into both nostrils daily. 16 g 5  ? GEMTESA 75 MG TABS Take 75 mg by mouth daily.    ? ketoconazole (NIZORAL) 2 % shampoo APPLY 1  APPLICATION TOPICALLY TWICE WEEKLY AS NEEDED FOR SCALP IRRITATION. 120 mL 1  ? losartan (COZAAR) 50 MG tablet Take 1 tablet (50 mg total) by mouth daily. Appointment due prior to additional refills 90 tablet 1  ? nitroGLYCERIN (NITROSTAT) 0.4 MG SL tablet Place 1 tablet (0.4 mg total) under the tongue every 5 (five) minutes as needed for up to 25 days for chest pain. 25 tablet 3  ? omeprazole (PRILOSEC) 40 MG capsule Take 40 mg by mouth daily.    ? pravastatin (PRAVACHOL) 40 MG tablet Take 1 tablet (40 mg total) by mouth daily. TAKE 1 TABLET BY MOUTH EVERYDAY AT BEDTIME 90 tablet 2  ? sodium chloride (OCEAN) 0.65 % SOLN nasal spray Place 1 spray into both nostrils as needed for congestion.    ? SYNTHROID 100 MCG tablet TAKE 1 TABLET BY MOUTH EVERY DAY BEFORE BREAKFAST 90 tablet 1  ? Zolpidem Tartrate 3.5 MG SUBL Take 1 tablet by mouth at bedtime as needed. 30 tablet 5  ? ?No current facility-administered  medications for this visit.  ? ? ?Review of Systems: ?No chest pain. No shortness of breath. No urinary complaints.  ? ?PHYSICAL EXAM :   ? ?Wt Readings from Last 3 Encounters:  ?03/23/21 154 lb 6.4 oz (70 kg)  ?03/12/21 158 lb 8 oz (71.9 kg)  ?03/05/21 159 lb 3.2 oz (72.2 kg)  ? ? ?BP 138/70   Pulse 77   Wt 154 lb 6.4 oz (70 kg)   SpO2 98%   BMI 28.24 kg/m?  ?Constitutional:  Generally well appearing ***female in no acute distress. ?Psychiatric: Pleasant. Normal mood and affect. Behavior is normal. ?EENT: Pupils normal.  Conjunctivae are normal. No scleral icterus. ?Neck supple.  ?Cardiovascular: Normal rate, regular rhythm. No edema ?Pulmonary/chest: Effort normal and breath sounds normal. No wheezing, rales or rhonchi. ?Abdominal: Soft, nondistended, nontender. Bowel sounds active throughout. There are no masses palpable. No hepatomegaly. ?Neurological: Alert and oriented to person place and time. ?Skin: Skin is warm and dry. No rashes noted. ? ?Tye Savoy, NP  03/23/2021, 10:41 AM ? ?Cc:  ?[name], [name], NP ? ? ? ? ? ? ? ?

## 2021-03-23 NOTE — Patient Instructions (Addendum)
Follow up with our office after you have been seen at ENT -Dr Benjamine Mola  ? ?Or if you have persistent problems. ? ?You have been referred to [place] office. His office will contact you to schedule an appointment. If you have not heard from his office in 1-2 weeks then let us know.  ? ?Thank you for choosing me and [facility]. ? ? ? ?

## 2021-03-23 NOTE — Progress Notes (Signed)
Attending Physician's Attestation   I have reviewed the chart.   I agree with the Advanced Practitioner's note, impression, and recommendations with any updates as below.    Jayleana Colberg Mansouraty, MD [facility] Advanced Endoscopy Office # [phone]  

## 2021-03-23 NOTE — Progress Notes (Signed)
ASSESSMENT AND PLAN    # 88 yo female with chronic, intermittent non-productive cough, chronic sore throat, "full" ears and pooling of mucus in throat when waking up in am.  Followed by an Allergist, on Flonase and Astelin spray. She does have a history of GERD and a 5 cm hiatal hernia but seems unlikely to this would be causing all of her symptoms,especially given lack of response to PPI. However, she does have esophageal dysmotility which may be contributing to decreased clearance of mucus from esophagus.  --She needs to get established with ENT. Tells me she hasn't seen one since Dr. Lucia Gaskins retired several months ago. Will refer her to Dr. Benjamine Mola.   ADDENDUM:  After patient left office I found in Care Everywhere that she HAS been evaluated by an ENT other than Dr. Lucia Gaskins. She was seen by P.A.Spainhour Aug 2022 with [facility] ( when she couldn't get it for follow up with Dr. Lucia Gaskins). Will cancel the referral to Dr. Benjamine Mola, she can return to [facility]. I called patient and gave her this recommendation.   # Markedly esophageal dysmotility.  --Clinically doing better from dysphagia standpoint but it recurrent problems then will proceed with esophageal manometry as previously discussed.    Patient Profile:   Darlene [name] is a 88 y.o. female with a past medical history of  CVA (on Plavix), atrial fibrillation, anxiety/depression, sleep apnea, colon polyps (unknown pathology), status post appendectomy, seborrheic keratoses (seen dermatology in the past), chronic sore throat, esophagitis, hiatal hernia, esophageal dysmotility, diverticulosis, hemorrhoids, colonic AVM, adenomatous colon polyps.  Additional medical history as listed in [place] .   Patient is known to Dr.  Rush Landmark.    Chief Complaint : sinus drainage, sore throat, full ears  [name] continues to struggle with a sore throat. She has had sinus drainage and frequent sore throat since dental surgery two years ago. She has continued to  have an intermittent, non-productive cough, frequent sneezing, and her ears stay "full".  She has mucus pooling in her throat when she gets up in the am. Her ENT Dr. Lucia Gaskins retired and she hasn't established with another ENT.  She is followed by  an Horticulturist, commercial. She uses flonase and Astelin spray. She is taking daily Omeprazole. She is also taking FD Guard which interestingly helps with the sore throat.   We have also previously evaluated her for dysphagia. Prior workup has included an EGD with empiric dilation of esophagus in 2021. No esophageal lesions found. Mild improvement in dysphagia post dilation. Subsequently a barium swallow showed marked dysmotility. Plan was for possible manometry after completion of dental work if symptoms persisted.   LABORATORY DATA  Hepatic Function Latest Ref Rng & Units 12/26/2019 09/20/2019 08/24/2019  Total Protein 6.1 - 8.1 g/dL 7.0 7.3 6.8  Albumin 3.5 - 5.0 g/dL - 4.4 -  AST 10 - 35 U/L 16 19 16   ALT 6 - 29 U/L 13 21 16   Alk Phosphatase 38 - 126 U/L - 80 -  Total Bilirubin 0.2 - 1.2 mg/dL 0.5 0.4 0.4    CBC Latest Ref Rng & Units 08/29/2020 02/07/2020 12/26/2019  WBC 4.0 - 10.5 K/uL 7.5 7.3 6.5  Hemoglobin 12.0 - 15.0 g/dL 13.2 14.3 14.5  Hematocrit 36.0 - 46.0 % 41.0 41.9 43.3  Platelets 150 - 400 K/uL 253 267 263     PREVIOUS GI EVALUATIONS:   Imaging:  April 2022 Esophagram IMPRESSION: Limited esophagram due to the small amount of material that could  be swallowed any given time by the patient. Even slightly larger swallows were followed by some coughing. Signs of marked esophageal dysmotility with stasis in the esophagus and potential distal esophageal narrowing the setting of hiatal hernia.  No signs of gross aspiration or even penetration on limited swallow assessment. However, given the history of CVA and difficulties in the oral phase   swallow assessment may be helpful.  April 2022 SLP evaluation Pt arrives for her first MBS after two  esophagrams with finding of dysmotility as well and subsequent treatment for stricture. Pt demonstrates no oral or oropharyngeal dysphagia. However, even with initial sips, prior to significant esophageal filling, pt had immediate coughing, throat clearing and report of feeling irritated. She coughed throughout the study without any laryngeal penetration or aspiration. She did have mild premature spillage not outside of normal limits and was observed to have esophageal stasis with solids (see radiologist report below). Pt reports many cough triggers beyond swallowing, such as talking, spicy foods etc. She also suspects she is exposed to many allergens. Pt is likely experiencing a hypersensitive cough response during swallowing given triggers including esophageal dysphagia. The act of frequent coughing and throat clearing increases inflammatory responses and can cause more coughing. Offered counsel on esophageal strategies to clear residue but also introduced concepts of awareness of the problem, cough suppression techniques and recognizing environmental and behavioral triggers. Pt may benefit from f/u with OP SLP for therapeutic interventions that will target these techniques further.  Endoscopies:  Dec 2021 EGD -No gross lesions were noted in the entire esophagus. After the rest of the EGD was complete, a guidewire was placed and the scope was withdrawn. Dilation was performed with a [place] dilator with mild resistance at 16 mm and moderate resistance at 18 mm. The dilation site was examined following endoscope reinsertion and showed moderate mucosal disruption at the region just distal to the UES, moderate improvement in luminal narrowing and no perforation. Findings: - A 5 cm hiatal hernia was present. - No gross lesions were noted in the entire examined stomach. - No gross lesions were noted in the duodenal bulb, in the first portion of the duodenum and in the second portion of the duodenum.   Past  Medical History:  Diagnosis Date   Anxiety    Atypical mole 11/11/2020   Left Inner Knee (moderate-free)   Colon polyps    Depression    Hyperlipidemia    Hypertension    Hypothyroidism    Melanoma in situ ([place]) 09/19/2018   Right Arm (excision)   Skin cancer    Sleep apnea    Stroke [facility]) 2008    Past Surgical History:  Procedure Laterality Date   ADENOIDECTOMY     APPENDECTOMY  1960   CATARACT EXTRACTION Left    COLONOSCOPY     GUM SURGERY  03/2019   LUMBAR [place] SURGERY  2014   MELANOMA EXCISION  2020   MESH APPLIED TO LAP PORT     TONSILLECTOMY  1940    Current Medications, Allergies, Family History and Social History were reviewed in [employer] record.     Current Outpatient Medications  Medication Sig Dispense Refill   acetaminophen (TYLENOL) 500 MG tablet Take 25-500 mg by mouth every 6 (six) hours as needed for mild pain or headache.     azelastine (ASTELIN) 0.1 % nasal spray Place 1 to 2 sprays each nostril 1-2 times a day as needed for runny nose/drainage down throat 30 mL 5  [place] Oil-Levomenthol (FDGARD) 25-20.75 MG CAPS Take by mouth 2 (two) times daily.     clopidogrel (PLAVIX) 75 MG tablet TAKE 1 TABLET BY MOUTH EVERY DAY 90 tablet 1   diazepam (VALIUM) 5 MG tablet Take 0.5 tablets (2.5 mg total) by mouth as needed (anxiety). WILL NEED APPOINTMENT FOR ADDITIONAL REFILLS (Patient not taking: Reported on 03/12/2021) 15 tablet 0   fluticasone (FLONASE) 50 MCG/ACT nasal spray Place 1-2 sprays into both nostrils daily. 16 g 5   GEMTESA 75 MG TABS Take 75 mg by mouth daily.     ketoconazole (NIZORAL) 2 % shampoo APPLY 1 APPLICATION TOPICALLY TWICE WEEKLY AS NEEDED FOR SCALP IRRITATION. 120 mL 1   losartan (COZAAR) 50 MG tablet Take 1 tablet (50 mg total) by mouth daily. Appointment due prior to additional refills 90 tablet 1   nitroGLYCERIN (NITROSTAT) 0.4 MG SL tablet Place 1 tablet (0.4 mg total) under the tongue every 5 (five) minutes  as needed for up to 25 days for chest pain. 25 tablet 3   omeprazole (PRILOSEC) 40 MG capsule Take 40 mg by mouth daily.     pravastatin (PRAVACHOL) 40 MG tablet Take 1 tablet (40 mg total) by mouth daily. TAKE 1 TABLET BY MOUTH EVERYDAY AT BEDTIME 90 tablet 2   sodium chloride (OCEAN) 0.65 % SOLN nasal spray Place 1 spray into both nostrils as needed for congestion.     SYNTHROID 100 MCG tablet TAKE 1 TABLET BY MOUTH EVERY DAY BEFORE BREAKFAST 90 tablet 1   Zolpidem Tartrate 3.5 MG SUBL Take 1 tablet by mouth at bedtime as needed. 30 tablet 5   No current facility-administered medications for this visit.    Review of Systems: No chest pain. No shortness of breath. No urinary complaints.   PHYSICAL EXAM :    Wt Readings from Last 3 Encounters:  03/23/21 154 lb 6.4 oz (70 kg)  03/12/21 158 lb 8 oz (71.9 kg)  03/05/21 159 lb 3.2 oz (72.2 kg)    BP 138/70    Pulse 77    Wt 154 lb 6.4 oz (70 kg)    SpO2 98%    BMI 28.24 kg/m  Constitutional:  Generally well appearing female in no acute distress. Psychiatric: Pleasant. Normal mood and affect. Behavior is normal. EENT: Pupils normal.  Conjunctivae are normal. No scleral icterus. Neck supple.  Cardiovascular: Normal rate, regular rhythm. No edema Pulmonary/chest: Effort normal and breath sounds normal. No wheezing, rales or rhonchi. Abdominal: Soft, nondistended, nontender. Bowel sounds active throughout. There are no masses palpable. No hepatomegaly. Neurological: Alert and oriented to person place and time. Skin: Skin is warm and dry. No rashes noted.  Tye Savoy, NP  03/23/2021, 10:50 AM  Cc:  [name], [name], NP

## 2021-03-24 ENCOUNTER — Telehealth: Payer: Self-pay | Admitting: Nurse Practitioner

## 2021-03-24 NOTE — Telephone Encounter (Signed)
Inbound call from Dr.Teoh's office stating he no longer sees patients with throat issues and we will need to send referral to another provider. ?

## 2021-03-25 NOTE — Telephone Encounter (Signed)
Per Paula-NP , pt does not need to be seen at ENT. She is already est with an ENT.  ?

## 2021-03-31 ENCOUNTER — Other Ambulatory Visit: Payer: Self-pay | Admitting: Student

## 2021-03-31 DIAGNOSIS — E7800 Pure hypercholesterolemia, unspecified: Secondary | ICD-10-CM

## 2021-04-02 ENCOUNTER — Non-Acute Institutional Stay (INDEPENDENT_AMBULATORY_CARE_PROVIDER_SITE_OTHER): Payer: Medicare HMO | Admitting: Nurse Practitioner

## 2021-04-02 ENCOUNTER — Encounter: Payer: Self-pay | Admitting: Nurse Practitioner

## 2021-04-02 ENCOUNTER — Other Ambulatory Visit: Payer: Self-pay

## 2021-04-02 DIAGNOSIS — R1319 Other dysphagia: Secondary | ICD-10-CM | POA: Diagnosis not present

## 2021-04-02 DIAGNOSIS — I10 Essential (primary) hypertension: Secondary | ICD-10-CM

## 2021-04-02 DIAGNOSIS — R053 Chronic cough: Secondary | ICD-10-CM

## 2021-04-02 DIAGNOSIS — Z780 Asymptomatic menopausal state: Secondary | ICD-10-CM

## 2021-04-02 DIAGNOSIS — E039 Hypothyroidism, unspecified: Secondary | ICD-10-CM

## 2021-04-02 DIAGNOSIS — M8580 Other specified disorders of bone density and structure, unspecified site: Secondary | ICD-10-CM

## 2021-04-02 DIAGNOSIS — G4733 Obstructive sleep apnea (adult) (pediatric): Secondary | ICD-10-CM | POA: Diagnosis not present

## 2021-04-02 DIAGNOSIS — K219 Gastro-esophageal reflux disease without esophagitis: Secondary | ICD-10-CM | POA: Diagnosis not present

## 2021-04-02 DIAGNOSIS — J312 Chronic pharyngitis: Secondary | ICD-10-CM

## 2021-04-02 DIAGNOSIS — F5101 Primary insomnia: Secondary | ICD-10-CM

## 2021-04-02 DIAGNOSIS — Z8673 Personal history of transient ischemic attack (TIA), and cerebral infarction without residual deficits: Secondary | ICD-10-CM

## 2021-04-02 DIAGNOSIS — E782 Mixed hyperlipidemia: Secondary | ICD-10-CM

## 2021-04-02 DIAGNOSIS — R32 Unspecified urinary incontinence: Secondary | ICD-10-CM

## 2021-04-02 DIAGNOSIS — M4722 Other spondylosis with radiculopathy, cervical region: Secondary | ICD-10-CM

## 2021-04-02 DIAGNOSIS — J329 Chronic sinusitis, unspecified: Secondary | ICD-10-CM

## 2021-04-02 DIAGNOSIS — M5432 Sciatica, left side: Secondary | ICD-10-CM

## 2021-04-02 NOTE — Assessment & Plan Note (Signed)
DEXA 12/25/20, t score -1.0,  10 years possibility major fx 11%, hip fx 2.6%. Vit D, Cal-diet rich,  Vit D 3 39 12/26/20 

## 2021-04-02 NOTE — Assessment & Plan Note (Signed)
Ambien, Valium 

## 2021-04-02 NOTE — Assessment & Plan Note (Signed)
saw Ortho.  ?

## 2021-04-02 NOTE — Assessment & Plan Note (Signed)
multiple factorials, GERD vs chronic rhinitis ?

## 2021-04-02 NOTE — Assessment & Plan Note (Signed)
MR 02/26/20, C6-C7 ?

## 2021-04-02 NOTE — Assessment & Plan Note (Signed)
on plavix. Statin,  residual of  poor left peripheral vision  

## 2021-04-02 NOTE — Assessment & Plan Note (Signed)
takes Pravastatin, LDL 107 12/26/19 ?

## 2021-04-02 NOTE — Assessment & Plan Note (Signed)
chronic rhinitis, SOB, wheezing,  on Astelin, Flonase, Bronchodilator, underwent Pulmonology, ENT, GI, Allergy evaluation, had spirometry 10/20/20, Echo 60-65% EF 04/16/20, CTA 09/01/20        ?

## 2021-04-02 NOTE — Assessment & Plan Note (Signed)
,   blood pressure is controlled on Losartan qd. Followed by Cardiology. CTA negative 09/01/20. Bun/creat 17/0.74 08/29/20 ?

## 2021-04-02 NOTE — Progress Notes (Signed)
? ?Location:   clinic [facility] ?  ?Place of Service:    ?Provider: Marlana Latus NP ? ?Code Status: DNR ?Goals of Care: [place] ?Advanced Directives 01/22/2021  ?Does Patient Have a Medical Advance Directive? No  ?Type of Advance Directive -  ?Does patient want to make changes to medical advance directive? -  ?Copy of [place] in Chart? -  ?Would patient like information on creating a medical advance directive? -  ? ? ? ?Chief Complaint  ?Patient presents with  ? Medical Management of Chronic Issues  ?  Patient presents today for a 2 month follow up  ? ? ?HPI: Patient is a 88 y.o. female seen today for medical management of chronic diseases.   ?  ?  Hx of chronic rhinitis, SOB, wheezing,  on Astelin, Flonase, Bronchodilator, underwent Pulmonology, ENT, GI, Allergy evaluation, had spirometry 10/20/20, Echo 60-65% EF 04/16/20, CTA 09/01/20        ? Chronic sore throat: multiple factorials, GERD vs chronic rhinitis ?Insomnia Ambien, Valium ?            Hx of CVA, on plavix. Statin,  residual of  poor left peripheral vision  ?Hypothyroidism, on Levothyroxine, TSH 1.80 12/02/20 ?            HTN, blood pressure is controlled on Losartan qd. Followed by Cardiology. CTA negative 09/01/20. Bun/creat 17/0.74 08/29/20 ?           GERD, stable Omeprazole, FDgard heleped, saw GI, EGD 12/26/19. Vit B12 383 12/02/20 ?            Esophageal dysmotility, Swallow studay 05/12/20, worked with ST ?            OSA sleep study, saw Pulmonology.  ?            OA, saw Ortho.  ?            Hyperlipidemia, takes Pravastatin, LDL 107 12/26/19 ?            Cervical spinal stenosis, MR 02/26/20, C6-C7 ?            Urinary frequency/leakage, seems better on Gemtesa ?            Osteopenia, DEXA 12/25/20, t score -1.0,  10 years possibility major fx 11%, hip fx 2.6%. Vit D, Cal-diet rich,  Vit D 3 39 12/26/20 ?  ?  ? ?Past Medical History:  ?Diagnosis Date  ? Anxiety   ? Atypical mole 11/11/2020  ? Left Inner Knee (moderate-free)  ? Colon polyps   ?  Depression   ? Hyperlipidemia   ? Hypertension   ? Hypothyroidism   ? Melanoma in situ ([place]) 09/19/2018  ? Right Arm (excision)  ? Skin cancer   ? Sleep apnea   ? Stroke [facility]) 2008  ? ? ?Past Surgical History:  ?Procedure Laterality Date  ? ADENOIDECTOMY    ? APPENDECTOMY  1960  ? CATARACT EXTRACTION Left   ? COLONOSCOPY    ? GUM SURGERY  03/2019  ? [place] SURGERY  2014  ? MELANOMA EXCISION  2020  ? MESH APPLIED TO [facility] PORT    ? TONSILLECTOMY  1940  ? ? ?Allergies  ?Allergen Reactions  ? Norco [Hydrocodone-Acetaminophen] Nausea And Vomiting  ? Dust Mite Extract Other (See Comments)  ? Oxycontin [Oxycodone Hcl] Nausea And Vomiting  ? Pollen Extract Other (See Comments)  ? ? ?Allergies as of 04/02/2021   ? ?   Reactions  ?  Norco [hydrocodone-acetaminophen] Nausea And Vomiting  ? Dust Mite Extract Other (See Comments)  ? Oxycontin [oxycodone Hcl] Nausea And Vomiting  ? Pollen Extract Other (See Comments)  ? ?  ? ?  ?Medication List  ?  ? ?  ? Accurate as of April 02, 2021 11:59 PM. If you have any questions, ask your nurse or doctor.  ?  ?  ? ?  ? ?STOP taking these medications   ? ?Gemtesa 75 MG Tabs ?Generic drug: Vibegron ?Stopped by: Kenan Moodie X Tamecka Milham, NP ?  ? ?  ? ?TAKE these medications   ? ?acetaminophen 500 MG tablet ?Commonly known as: TYLENOL ?Take 25-500 mg by mouth every 6 (six) hours as needed for mild pain or headache. ?  ?azelastine 0.1 % nasal spray ?Commonly known as: ASTELIN ?Place 1 to 2 sprays each nostril 1-2 times a day as needed for runny nose/drainage down throat ?  ?clopidogrel 75 MG tablet ?Commonly known as: PLAVIX ?TAKE 1 TABLET BY MOUTH EVERY DAY ?  ?diazepam 5 MG tablet ?Commonly known as: VALIUM ?Take 0.5 tablets (2.5 mg total) by mouth as needed (anxiety). WILL NEED APPOINTMENT FOR ADDITIONAL REFILLS ?  ?FDgard 25-20.75 MG Caps ?Generic drug: Caraway Oil-Levomenthol ?Take by mouth 2 (two) times daily. ?  ?fluticasone 50 MCG/ACT nasal spray ?Commonly known as: FLONASE ?Place 1-2 sprays into  both nostrils daily. ?  ?ketoconazole 2 % shampoo ?Commonly known as: NIZORAL ?APPLY 1 APPLICATION TOPICALLY TWICE WEEKLY AS NEEDED FOR SCALP IRRITATION. ?  ?losartan 50 MG tablet ?Commonly known as: COZAAR ?Take 1 tablet (50 mg total) by mouth daily. Appointment due prior to additional refills ?  ?nitroGLYCERIN 0.4 MG SL tablet ?Commonly known as: NITROSTAT ?Place 1 tablet (0.4 mg total) under the tongue every 5 (five) minutes as needed for up to 25 days for chest pain. ?  ?omeprazole 40 MG capsule ?Commonly known as: PRILOSEC ?Take 40 mg by mouth daily. ?  ?pravastatin 40 MG tablet ?Commonly known as: PRAVACHOL ?TAKE 1 TABLET BY MOUTH EVERYDAY AT BEDTIME ?  ?sodium chloride 0.65 % Soln nasal spray ?Commonly known as: OCEAN ?Place 1 spray into both nostrils as needed for congestion. ?  ?Synthroid 100 MCG tablet ?Generic drug: levothyroxine ?TAKE 1 TABLET BY MOUTH EVERY DAY BEFORE BREAKFAST ?  ?Zolpidem Tartrate 3.5 MG Subl ?Take 1 tablet by mouth at bedtime as needed. ?  ? ?  ? ? ?Review of Systems:  ?Review of Systems  ?Constitutional:  Negative for fatigue, fever and unexpected weight change.  ?HENT:  Positive for congestion, hearing loss and sinus pressure. Negative for voice change.   ?     Chronic sinusitis, underwent ENT, sleep study-sleep apnea.   ?Eyes:  Positive for visual disturbance.  ?     Left lateral visual field deficit  ?Respiratory:  Negative for cough and shortness of breath.   ?Cardiovascular:  Negative for leg swelling.  ?Gastrointestinal:  Negative for abdominal pain and constipation.  ?Genitourinary:  Positive for frequency. Negative for dysuria and urgency.  ?     Urinary leakage. Doing Kegel exercise.   ?Musculoskeletal:  Negative for back pain and gait problem.  ?Skin:  Negative for color change.  ?Neurological:  Negative for speech difficulty, weakness and headaches.  ?Psychiatric/Behavioral:  Positive for sleep disturbance. Negative for dysphoric mood. The patient is not nervous/anxious.    ?     Early am awake, difficulty returning asleep.   ? ?Health Maintenance  ?Topic Date Due  ? INFLUENZA VACCINE  04/17/2021 (Originally 08/18/2020)  ? Zoster Vaccines- Shingrix (2 of 2) 05/05/2021 (Originally 02/14/2019)  ? COVID-19 Vaccine (4 - Booster for Moderna series) 06/02/2021 (Originally 07/31/2020)  ? TETANUS/TDAP  04/03/2022 (Originally 11/26/2018)  ? COLONOSCOPY (Pts 45-13yr Insurance coverage will need to be confirmed)  10/25/2021  ? Pneumonia Vaccine 86 Years old  Completed  ? DEXA SCAN  Completed  ? HPV VACCINES  Aged Out  ? ? ?Physical Exam: ?Vitals:  ? 04/02/21 1359  ?BP: 122/78  ?Pulse: 70  ?Temp: (!) 97.1 ?F (36.2 ?C)  ?SpO2: 98%  ?Weight: 161 lb 12.8 oz (73.4 kg)  ?Height: '5\' 2"'$  (1.575 m)  ? ?Body mass index is 29.59 kg/m?.Marland Kitchen?Physical Exam ?Vitals and nursing note reviewed.  ?Constitutional:   ?   Appearance: Normal appearance.  ?HENT:  ?   Head: Normocephalic and atraumatic.  ?   Nose: Nose normal.  ?   Mouth/Throat:  ?   Mouth: Mucous membranes are moist.  ?Eyes:  ?   Extraocular Movements: Extraocular movements intact.  ?   Conjunctiva/sclera: Conjunctivae normal.  ?   Pupils: Pupils are equal, round, and reactive to light.  ?   Comments: Left peripheral visual field deficit since CVA 2006  ?Cardiovascular:  ?   Rate and Rhythm: Normal rate and regular rhythm.  ?   Heart sounds: No murmur heard. ?Pulmonary:  ?   Effort: Pulmonary effort is normal.  ?   Breath sounds: Rales present.  ?   Comments: Bibasilar rales.  ?Abdominal:  ?   General: Bowel sounds are normal.  ?   Palpations: Abdomen is soft.  ?   Tenderness: There is no abdominal tenderness.  ?Musculoskeletal:  ?   Cervical back: Normal range of motion and neck supple.  ?   Right lower leg: No edema.  ?   Left lower leg: No edema.  ?Skin: ?   General: Skin is warm and dry.  ?   Findings: Bruising present.  ?   Comments: Left inner thigh ecchymoses  ?Neurological:  ?   General: No focal deficit present.  ?   Mental Status: She is alert and  oriented to person, place, and time. Mental status is at baseline.  ?   Gait: Gait normal.  ?Psychiatric:     ?   Mood and Affect: Mood normal.     ?   Behavior: Behavior normal.     ?   Thought Conten

## 2021-04-02 NOTE — Assessment & Plan Note (Signed)
on Levothyroxine, TSH 1.80 12/02/20 

## 2021-04-02 NOTE — Assessment & Plan Note (Addendum)
stable Omeprazole, saw GI, FDgard helped in the past,  EGD 12/26/19. Vit B12 383 12/02/20 ?

## 2021-04-02 NOTE — Assessment & Plan Note (Signed)
sleep study, saw Pulmonology.  ?

## 2021-04-02 NOTE — Assessment & Plan Note (Signed)
Hx of chronic rhinitis, SOB, wheezing,  on Astelin, Flonase, Bronchodilator, underwent Pulmonology, ENT, GI, Allergy evaluation, had spirometry 10/20/20, Echo 60-65% EF 04/16/20, CTA 09/01/20    ?Will try OTC Singulair.  ?

## 2021-04-02 NOTE — Assessment & Plan Note (Signed)
frequency/leakage, seems better on Gemtesa ?

## 2021-04-02 NOTE — Assessment & Plan Note (Signed)
Esophageal dysmotility, Swallow studay 05/12/20, worked with ST 

## 2021-04-03 ENCOUNTER — Telehealth: Payer: Self-pay | Admitting: *Deleted

## 2021-04-03 ENCOUNTER — Encounter: Payer: Self-pay | Admitting: Nurse Practitioner

## 2021-04-03 MED ORDER — MONTELUKAST SODIUM 10 MG PO TABS: 10.0000 mg | ORAL_TABLET | Freq: Every day | ORAL | 5 refills | Status: DC

## 2021-04-03 MED ORDER — FDGARD 25-20.75 MG PO CAPS: ORAL_CAPSULE | ORAL | 5 refills | Status: DC

## 2021-04-03 NOTE — Telephone Encounter (Signed)
Patient called and stated that she would like Rx's to be sent to pharmacy. Stated that it is cheaper for her with a Rx. Stated that she was seen yesterday by [facility].  ?FDGard in patient's current medication list, ManXie added Singulair yesterday.  ? ? ?OV Note Dated 04/02/2021: ?Chronic sinusitis ?Hx of chronic rhinitis, SOB, wheezing,  on Astelin, Flonase, Bronchodilator, underwent Pulmonology, ENT, GI, Allergy evaluation, had spirometry 10/20/20, Echo 60-65% EF 04/16/20, CTA 09/01/20    ?Will try OTC Singulair.  ?

## 2021-04-06 ENCOUNTER — Encounter: Payer: Self-pay | Admitting: Student

## 2021-04-06 ENCOUNTER — Ambulatory Visit: Payer: Medicare HMO | Admitting: Student

## 2021-04-06 ENCOUNTER — Other Ambulatory Visit: Payer: Self-pay

## 2021-04-06 VITALS — BP 141/79 | HR 96 | Temp 97.4°F | Resp 16 | Ht 62.0 in | Wt 159.0 lb

## 2021-04-06 DIAGNOSIS — E7800 Pure hypercholesterolemia, unspecified: Secondary | ICD-10-CM

## 2021-04-06 DIAGNOSIS — M25512 Pain in left shoulder: Secondary | ICD-10-CM

## 2021-04-06 DIAGNOSIS — M79602 Pain in left arm: Secondary | ICD-10-CM

## 2021-04-06 DIAGNOSIS — I10 Essential (primary) hypertension: Secondary | ICD-10-CM
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
## 2021-04-15 ENCOUNTER — Ambulatory Visit: Payer: Medicare HMO | Admitting: Physician Assistant

## 2021-04-15 ENCOUNTER — Other Ambulatory Visit: Payer: Self-pay

## 2021-04-15 DIAGNOSIS — L821 Other seborrheic keratosis: Secondary | ICD-10-CM | POA: Diagnosis not present

## 2021-04-15 DIAGNOSIS — Z86006 Personal history of melanoma in-situ: Secondary | ICD-10-CM

## 2021-04-15 DIAGNOSIS — Z1283 Encounter for screening for malignant neoplasm of skin: Secondary | ICD-10-CM

## 2021-04-15 DIAGNOSIS — Z85820 Personal history of malignant melanoma of skin: Secondary | ICD-10-CM

## 2021-04-16 ENCOUNTER — Telehealth: Payer: Self-pay | Admitting: Student

## 2021-04-16 NOTE — Telephone Encounter (Signed)
Called pt and pt mention if I could call her back

## 2021-04-28 ENCOUNTER — Other Ambulatory Visit: Payer: Self-pay | Admitting: Nurse Practitioner

## 2021-04-29 ENCOUNTER — Other Ambulatory Visit: Payer: Self-pay | Admitting: Nurse Practitioner

## 2021-05-04 ENCOUNTER — Telehealth: Payer: Self-pay | Admitting: Nurse Practitioner

## 2021-05-04 ENCOUNTER — Encounter: Payer: Self-pay | Admitting: Physician Assistant

## 2021-05-04 NOTE — Telephone Encounter (Signed)
Inbound call from patient would like a call back states she have been experiencing bloating, gas, and diarrhea for a couple days.  ?

## 2021-05-04 NOTE — Telephone Encounter (Signed)
Left message for pt to call back  °

## 2021-05-04 NOTE — Progress Notes (Signed)
? ?  Follow-Up Visit ?  ?Subjective  ?Darlene [name] is a 88 y.o. female who presents for the following: Annual Exam (Here for 6 month check up. Wants a full body skin exam. History of melanoma in situ.). ? ? ?The following portions of the chart were reviewed this encounter and updated as appropriate:  Tobacco  Allergies  Meds  Problems  Med Hx  Surg Hx  Fam Hx   ?  ? ?Objective  ?Well appearing patient in no apparent distress; mood and affect are within normal limits. ? ?A full examination was performed including scalp, head, eyes, ears, nose, lips, neck, chest, axillae, abdomen, back, buttocks, bilateral upper extremities, bilateral lower extremities, hands, feet, fingers, toes, fingernails, and toenails. All findings within normal limits unless otherwise noted below. ? ?head to toe ?No atypical nevi or signs of NMSC noted at the time of the visit.  ? ?Right Arm ?White scar- clear ? ?Left Forehead, Left Parietal Scalp ?Stuck-on, waxy, tan-brown papules and plaques. --Discussed benign etiology and prognosis.  ? ? ?Assessment & Plan  ?Encounter for screening for malignant neoplasm of skin ?head to toe ? ?Yearly skin examination  ? ?Personal history of malignant melanoma of skin ?Right Arm ? ?Yearly skin examination  ? ?Seborrheic keratosis (2) ?Left Parietal Scalp; Left Forehead ? ?Okay to leave if stable ? ? ? ?I, Marae Cottrell, PA-C, have reviewed all documentation's for this visit.  The documentation on 05/04/21 for the exam, diagnosis, procedures and orders are all accurate and complete. ?

## 2021-05-04 NOTE — Telephone Encounter (Signed)
Spoke with pt. Pt states she has diarrhea 4-5 times per day that started a couple days ago. Pt also states she has had a lot of bloating. Pt reports she has been taking medications as prescribed but nothing helps. Pt states she has been taking omeprazole 40 mg and FD guard. Pt has not tried the gas x that was recommended at December appt. Pt denies fever.  ?

## 2021-05-06 NOTE — Telephone Encounter (Signed)
Pt called back and stated that diarrhea has improved but she still has bloating and increased burping and flatulence. Pt concerned symptoms are from lactose intolerance or because she is unable to take omeprazole in the morning. Pt states she currently takes omeprazole in the early afternoon because she has to wait 4 hours from when she takes her synthroid.  Pt states she also takes the omeprazole at night. Pt is scheduled for an appointment with Vicie Mutters on Friday 4/21 @ 1:30 pm. Pt verbalized understanding.  ?
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
## 2021-05-07 NOTE — Progress Notes (Signed)
? ? ?05/08/2021 ?Darlene [name] ?[id number] ?[birth year] ? ?Referring provider: [name], [name], NP ?Primary GI doctor: Dr. Rush Landmark ? ?ASSESSMENT AND PLAN:  ? ?Abdominal bloating ?Likely lactose intolerance, can take lactate and avoid lactulose ?Given FODMAP information and discussed diet.  ?Consider trial of Xifaxan 550 mg TID x 14 days, may substitute with doxycycline 100 mg BID if Xifaxan is cost prohibitive.  ? ?Gastroesophageal reflux disease, unspecified whether esophagitis present ?she reports symptoms are currently well controlled, and denies breakthrough reflux, burning in chest, hoarseness or cough. ?Lifestyle changes discussed, avoid NSAIDS ?Continue current medications ? ?History of colonic polyps ?Colon 10/2018 tic, hem, 5 polyps, possible recall 10/2021.  ?Will follow up in 3 months, can discuss at that visit if we want to pursue colonoscopy. While patient is 71, especially if the loose stools continue she is healthy enough to pursue endoscopic evaluation. ? ?History of CVA ?On plavix ?08/2020 CTA with no coronary artery disease ?04/14/2020 low risk myoview stress test ? ?Diarrhea, unspecified type ?Likely due to lactose ?Has improved, if continues with recent ABX call and we will get stool samples.  ?If continues can consider repeat colon for microscopic colitis.  ? ? ?History of Present Illness:  ?88 y.o. female  with a past medical history of CVA (on Plavix), atrial fibrillation, anxiety/depression, sleep apnea, colon polyps, status post appendectomy, chronic sore throat, esophagitis, hiatal hernia, esophageal dysmotility, diverticulosis, hemorrhoids, colonic AVM, adenomatous colon polyps 10/2018. and others listed below, returns to clinic today for evaluation of diarrhea and abdominal bloating. ? ?FU with cardiology 05/08/2021 Stress echo 05/2017 EF 75-80%, no stress induced wall motion abnormality. 04/14/2020 low risk myoview stress test ? ?Patient called office 05/04/2021 with diarrhea 4-5 times per  day.   ?Having abdominal bloating.   ?On omeprazole and FD guard without improvement. ?Scheduled for visit today. ?Colon 10/2018 tic, hem, 5 polyps, possible recall 10/2021.  ? ?Patient states she had diarrhea with bloating for 4-5 days yellow color, suspected it was increase in ice cream so patient did a trial off and her symptoms did improve.  ?She ate something with cream cheese and re experienced symptoms.  ?She has not had diarrhea for several days.  ?Patient denies blood in stool, fever, chills, unintentional weight loss.  ?Denies close contacts ill, recent camping, recent travel. ?Patient reports recent antibiotic use for 1-2 weeks for dental work.  ? ?She states she takes the omeprazole twice a day but occ just once due to having it further away from her thyroid medication.  ?She states the IBGARD helps the best.  ? ?2021 EGD relook after treatment with empiric dilatation due to dysphagia with mild improvement. ?Subsequent barium swallow showed marked dysmotility ?10/26/2018 endoscopy for reflux showed LA grade C esophagitis, 4 cm hiatal hernia multiple gastric polyps ?10/26/2018 colonoscopy nonbleeding nonthrombosed external and internal hemorrhoids, diverticulosis diffuse, 5 (2 adenomatous and 3 sessile serrated) polyps 2-13 millimeters in size, single colonic angiectasia, normally would recommend colonoscopy 3 years however secondary to patient's age and comorbidities would evaluate closer to follow-up date ? ?CBC  08/29/2020  ?HGB 13.2 MCV 91.3 without evidence of anemia ?WBC 7.5 Platelets 253 ?12/02/2020  B12 383 ?Kidney function 08/29/2020  ?BUN 17 Cr 0.74  ?GFR >60  ?Potassium 3.7   ?LFTs 12/26/2019  ?AST 16 ALT 13 ?Alkphos 80 TBili 0.5 ?04/03/2019 LIPASE 30 ?12/02/2020 TSH 1.80 ? ? ?Current Medications:  ? ?Current Outpatient Medications (Endocrine & Metabolic):  ?  SYNTHROID 100 MCG tablet, TAKE 1 TABLET BY MOUTH EVERY  DAY BEFORE BREAKFAST ? ?Current Outpatient Medications (Cardiovascular):  ?  losartan  (COZAAR) 100 MG tablet, Take 1 tablet (100 mg total) by mouth daily. Appointment due prior to additional refills ?  nitroGLYCERIN (NITROSTAT) 0.4 MG SL tablet, Place 1 tablet (0.4 mg total) under the tongue every 5 (five) minutes as needed for up to 25 days for chest pain. ?  pravastatin (PRAVACHOL) 40 MG tablet, TAKE 1 TABLET BY MOUTH EVERYDAY AT BEDTIME ? ?Current Outpatient Medications (Respiratory):  ?  azelastine (ASTELIN) 0.1 % nasal spray, Place 1 to 2 sprays each nostril 1-2 times a day as needed for runny nose/drainage down throat ?  fluticasone (FLONASE) 50 MCG/ACT nasal spray, Place 1-2 sprays into both nostrils daily. ? ?Current Outpatient Medications (Analgesics):  ?  acetaminophen (TYLENOL) 500 MG tablet, Take 25-500 mg by mouth every 6 (six) hours as needed for mild pain or headache. ? ?Current Outpatient Medications (Hematological):  ?  clopidogrel (PLAVIX) 75 MG tablet, TAKE 1 TABLET BY MOUTH EVERY DAY ? ?Current Outpatient Medications (Other):  ?  [place]araway Oil-Levomenthol (FDGARD) 25-20.75 MG CAPS, Take one capsule by mouth twice daily. ?  diazepam (VALIUM) 5 MG tablet, Take 0.5 tablets (2.5 mg total) by mouth as needed (anxiety). WILL NEED APPOINTMENT FOR ADDITIONAL REFILLS ?  ketoconazole (NIZORAL) 2 % shampoo, APPLY 1 APPLICATION TOPICALLY TWICE WEEKLY AS NEEDED FOR SCALP IRRITATION. ?  Ketoconazole 2 % FOAM, 1 application ?  omeprazole (PRILOSEC) 40 MG capsule, Take 40 mg by mouth as needed. ?  Zolpidem Tartrate 3.5 MG SUBL, Take 1 tablet by mouth at bedtime as needed. ? ?Surgical History:  ?She  has a past surgical history that includes Lumbar disc surgery (2014); Appendectomy (1960); Tonsillectomy (1940); Adenoidectomy; Mesh applied to lap port; Colonoscopy; Gum surgery (03/2019); Melanoma excision (2020); and Cataract extraction (Left). ?Family History:  ?Her family history includes Allergic rhinitis in her sister; Bipolar disorder in her daughter; CVA in her brother; Heart attack in her  father and paternal grandfather; Heart disease in her mother; Hypertension in her sister; Lung cancer in her maternal grandfather; Lung cancer (age of onset: 69) in her mother; Melanoma in her daughter; Psoriasis in her daughter; Rheum arthritis in her daughter; Stroke (age of onset: 16) in her father. ?Social History:  ? reports that she has never smoked. She has never used smokeless tobacco. She reports current alcohol use. She reports that she does not use drugs. ? ?Current Medications, Allergies, Past Medical History, Past Surgical History, Family History and Social History were reviewed in [employer] record. ? ?Physical Exam: ?BP 126/64   Pulse 74   Ht 5' 2.5" (1.588 m)   Wt 156 lb (70.8 kg)   BMI 28.08 kg/m?  ?General:   Pleasant, well developed female in no acute distress ?Heart:  Regular rate and rhythm; no murmurs ?Pulm: Clear anteriorly; no wheezing ?Abdomen:  Soft, Obese AB, Active bowel sounds. No tenderness . , No organomegaly appreciated. ?Extremities:  without  edema. ?Neurologic:  Alert and  oriented x4;  No focal deficits.  ?Psych:  Cooperative. Normal mood and affect. ? ? ?Vladimir Crofts, PA-C ?05/08/21 ?

## 2021-05-08 ENCOUNTER — Ambulatory Visit: Payer: Medicare HMO | Admitting: Physician Assistant

## 2021-05-08 ENCOUNTER — Encounter: Payer: Self-pay | Admitting: Student

## 2021-05-08 ENCOUNTER — Encounter: Payer: Self-pay | Admitting: Physician Assistant

## 2021-05-08 ENCOUNTER — Ambulatory Visit: Payer: Medicare HMO | Admitting: Student

## 2021-05-08 VITALS — BP 149/88 | HR 72 | Temp 98.2°F | Resp 17 | Ht 62.0 in | Wt 157.6 lb

## 2021-05-08 VITALS — BP 126/64 | HR 74 | Ht 62.5 in | Wt 156.0 lb

## 2021-05-08 DIAGNOSIS — R197 Diarrhea, unspecified: Secondary | ICD-10-CM | POA: Diagnosis not present

## 2021-05-08 DIAGNOSIS — Z860100 Personal history of colon polyps, unspecified: Secondary | ICD-10-CM

## 2021-05-08 DIAGNOSIS — K219 Gastro-esophageal reflux disease without esophagitis: Secondary | ICD-10-CM | POA: Diagnosis not present

## 2021-05-08 DIAGNOSIS — I10 Essential (primary) hypertension: Secondary | ICD-10-CM

## 2021-05-08 DIAGNOSIS — R140 Abdominal distension (gaseous): Secondary | ICD-10-CM | POA: Diagnosis not present

## 2021-05-08 DIAGNOSIS — E7800 Pure hypercholesterolemia, unspecified: Secondary | ICD-10-CM

## 2021-05-08 DIAGNOSIS — R072 Precordial pain: Secondary | ICD-10-CM

## 2021-05-08 MED ORDER — LOSARTAN POTASSIUM 100 MG PO TABS
100.0000 mg | ORAL_TABLET | Freq: Every day | ORAL | 3 refills | Status: DC
Start: 2021-05-08 — End: 2021-06-06

## 2021-05-08 NOTE — Patient Instructions (Addendum)
Avoid milk products, can do lactate if you want, beano ?List of other foods to look at avoiding.  ?Can add on probiotic for 2-4 weeks if you take an antibiotic or see if this helps bloating.  ?This is short term use for probiotics, not longer term  ? ?The main dietary sources of the four groups of FODMAPs include:  ?Oligosaccharides: Wheat, rye, legumes and various fruits and vegetables, such as garlic and onions. ? ?Disaccharides: Milk, yogurt and soft cheese. Lactose is the main carb. ? ?Monosaccharides: Various fruit including figs and mangoes, and sweeteners such as honey and agave nectar. Fructose is the main carb. ? ?Polyols: Certain fruits and vegetables including blackberries and lychee, as well as some low-calorie sweeteners like those in sugar-free gum. ?  ?Keep a food diary. This will help you identify foods that cause symptoms. Write down: ?What you eat and when. ?What symptoms you have. ?When symptoms occur in relation to your meals. ?Avoid foods that cause symptoms. Talk with your dietitian about other ways to get the same nutrients that are in these foods. ?Eat your meals slowly, in a relaxed setting. ?Aim to eat 5-6 small meals per day. Do not skip meals. ?Drink enough fluids to keep your urine clear or pale yellow. ?If dairy products cause your symptoms to flare up, try eating less of them. You might be able to handle yogurt better than other dairy products because it contains bacteria that help with digestion. ?  ? ? ? ?

## 2021-05-11 NOTE — Progress Notes (Signed)
Attending Physician's Attestation   I have reviewed the chart.   I agree with the Advanced Practitioner's note, impression, and recommendations with any updates as below.    Brendalee Matthies Mansouraty, MD  Gastroenterology [facility] Office # [phone]  

## 2021-05-13 LAB — LIPID PANEL WITH LDL/HDL RATIO
Cholesterol, Total: 163 mg/dL (ref 100–199)
HDL: 44 mg/dL (ref >39)
LDL Chol Calc (NIH): 94 mg/dL (ref 0–99)
LDL/HDL Ratio: 2.1 ratio (ref 0.0–3.2)
Triglycerides: 144 mg/dL (ref 0–149)
VLDL Cholesterol Cal: 25 mg/dL (ref 5–40)

## 2021-05-13 LAB — BASIC METABOLIC PANEL
BUN/Creatinine Ratio: 18 (ref 12–28)
BUN: 14 mg/dL (ref 8–27)
CO2: 24 mmol/L (ref 20–29)
Calcium: 9.9 mg/dL (ref 8.7–10.3)
Chloride: 106 mmol/L (ref 96–106)
Creatinine, Ser: 0.79 mg/dL (ref 0.57–1.00)
Glucose: 79 mg/dL (ref 70–99)
Potassium: 4.5 mmol/L (ref 3.5–5.2)
Sodium: 144 mmol/L (ref 134–144)
eGFR: 73 mL/min/{1.73_m2} (ref >59)

## 2021-05-14 ENCOUNTER — Non-Acute Institutional Stay: Payer: Medicare HMO | Admitting: Nurse Practitioner

## 2021-05-14 ENCOUNTER — Encounter: Payer: Self-pay | Admitting: Nurse Practitioner

## 2021-05-14 DIAGNOSIS — E739 Lactose intolerance, unspecified: Secondary | ICD-10-CM

## 2021-05-14 DIAGNOSIS — Z780 Asymptomatic menopausal state: Secondary | ICD-10-CM

## 2021-05-14 DIAGNOSIS — Z8673 Personal history of transient ischemic attack (TIA), and cerebral infarction without residual deficits: Secondary | ICD-10-CM

## 2021-05-14 DIAGNOSIS — E782 Mixed hyperlipidemia: Secondary | ICD-10-CM

## 2021-05-14 DIAGNOSIS — M8580 Other specified disorders of bone density and structure, unspecified site: Secondary | ICD-10-CM | POA: Diagnosis not present

## 2021-05-14 DIAGNOSIS — M4722 Other spondylosis with radiculopathy, cervical region: Secondary | ICD-10-CM

## 2021-05-14 DIAGNOSIS — G4733 Obstructive sleep apnea (adult) (pediatric): Secondary | ICD-10-CM

## 2021-05-14 DIAGNOSIS — K219 Gastro-esophageal reflux disease without esophagitis: Secondary | ICD-10-CM

## 2021-05-14 DIAGNOSIS — M5432 Sciatica, left side: Secondary | ICD-10-CM

## 2021-05-14 DIAGNOSIS — R32 Unspecified urinary incontinence: Secondary | ICD-10-CM

## 2021-05-14 DIAGNOSIS — E039 Hypothyroidism, unspecified: Secondary | ICD-10-CM

## 2021-05-14 DIAGNOSIS — R1319 Other dysphagia: Secondary | ICD-10-CM

## 2021-05-14 DIAGNOSIS — J329 Chronic sinusitis, unspecified: Secondary | ICD-10-CM

## 2021-05-14 DIAGNOSIS — I10 Essential (primary) hypertension: Secondary | ICD-10-CM

## 2021-05-14 DIAGNOSIS — J312 Chronic pharyngitis: Secondary | ICD-10-CM

## 2021-05-14 NOTE — Assessment & Plan Note (Signed)
sleep study, saw Pulmonology.  ?

## 2021-05-14 NOTE — Assessment & Plan Note (Signed)
on Levothyroxine, TSH 1.80 12/02/20 

## 2021-05-14 NOTE — Assessment & Plan Note (Addendum)
Urinary frequency/leakage, stopped taking Gemtesa, acupuncture improved initially. Saw urology.  ?

## 2021-05-14 NOTE — Assessment & Plan Note (Signed)
Hx of chronic rhinitis, SOB, wheezing,  on Astelin, Flonase, Bronchodilator, underwent Pulmonology, ENT, GI, Allergy evaluation, had spirometry 10/20/20, Echo 60-65% EF 04/16/20, CTA 09/01/20     

## 2021-05-14 NOTE — Assessment & Plan Note (Signed)
Cervical spinal stenosis, MR 02/26/20, C6-C7 ?

## 2021-05-14 NOTE — Assessment & Plan Note (Signed)
blood pressure is controlled on Losartan qd. Followed by Cardiology. CTA negative 09/01/20. Bun/creat 14/0.79 05/12/21 ?

## 2021-05-14 NOTE — Assessment & Plan Note (Signed)
stable Omeprazole, FDgard heleped, saw GI, EGD 12/26/19. Vit B12 383 12/02/20 ?

## 2021-05-14 NOTE — Assessment & Plan Note (Signed)
Hx of CVA, on plavix. Statin,  residual of  poor left peripheral vision  

## 2021-05-14 NOTE — Assessment & Plan Note (Signed)
Chronic sore throat: multiple factorials, GERD vs chronic rhinitis 

## 2021-05-14 NOTE — Assessment & Plan Note (Signed)
takes Pravastatin, LDL 94 05/12/21 

## 2021-05-14 NOTE — Assessment & Plan Note (Signed)
Swallow studay 05/12/20, worked with [facility] ?

## 2021-05-14 NOTE — Assessment & Plan Note (Signed)
Osteopenia, DEXA 12/25/20, t score -1.0,  10 years possibility major fx 11%, hip fx 2.6%. Vit D, Cal-diet rich,  Vit D 3 39 12/26/20 

## 2021-05-14 NOTE — Assessment & Plan Note (Signed)
Diet controls ?

## 2021-05-14 NOTE — Assessment & Plan Note (Signed)
saw Ortho. Ambulates independently.  ?

## 2021-05-14 NOTE — Progress Notes (Signed)
? ?Location:   Clinic [facility] ?  ?Place of Service:  Clinic (12) ?Provider: Marlana Latus NP ? ?Code Status: DNR ?Goals of Care: [place] ? ?  05/14/2021  ?  3:44 PM  ?Advanced Directives  ?Does Patient Have a Medical Advance Directive? Yes  ?Type of Advance Directive Living will  ?Does patient want to make changes to medical advance directive? No - Patient declined  ? ? ? ?Chief Complaint  ?Patient presents with  ? Medical Management of Chronic Issues  ?  Follow up  ? ? ?HPI: Patient is a 88 y.o. female seen today for medical management of chronic diseases.   ?  ?  ?Hx of chronic rhinitis, SOB, wheezing,  on Astelin, Flonase, Bronchodilator, underwent Pulmonology, ENT, GI, Allergy evaluation, had spirometry 10/20/20, Echo 60-65% EF 04/16/20, CTA 09/01/20        ?            Chronic sore throat: multiple factorials, GERD vs chronic rhinitis ?Insomnia Ambien, Valium ?            Hx of CVA, on plavix. Statin,  residual of  poor left peripheral vision  ?Hypothyroidism, on Levothyroxine, TSH 1.80 12/02/20 ?            HTN, blood pressure is controlled on Losartan qd. Followed by Cardiology. CTA negative 09/01/20. Bun/creat 14/0.79 05/12/21 ?           GERD, stable Omeprazole, FDgard helped,  saw GI, EGD 12/26/19. Vit B12 383 12/02/20 ? Lactose intolerance, diet adjustment, f/u GI ?            Esophageal dysmotility, Swallow studay 05/12/20, worked with ST ?            OSA sleep study, saw Pulmonology. Refuse using CPAP ?            OA, saw Ortho.  ?            Hyperlipidemia, takes Pravastatin, LDL 94 05/12/21 ?            Cervical spinal stenosis, MR 02/26/20, C6-C7 ?            Urinary frequency/leakage, 2-3x/night, stopped taking Gemtesa, acupuncture improved initially. Saw urology.  ?            Osteopenia, DEXA 12/25/20, t score -1.0,  10 years possibility major fx 11%, hip fx 2.6%. Vit D, Cal-diet rich,  Vit D 3 39 12/26/20 ? [facility] audiology eval, ENT placed tube in the right ear, hearing aids ?  ? ? ?Past Surgical History:  ?Procedure  Laterality Date  ? ADENOIDECTOMY    ? APPENDECTOMY  1960  ? CATARACT EXTRACTION Left   ? COLONOSCOPY    ? GUM SURGERY  03/2019  ? [place] SURGERY  2014  ? MELANOMA EXCISION  2020  ? MESH APPLIED TO LAP PORT    ? TONSILLECTOMY  1940  ? ? ?Allergies  ?Allergen Reactions  ? Norco [Hydrocodone-Acetaminophen] Nausea And Vomiting  ? Dust Mite Extract Other (See Comments)  ? Oxycontin [Oxycodone Hcl] Nausea And Vomiting  ? Pollen Extract Other (See Comments)  ? ? ?Allergies as of 05/14/2021   ? ?   Reactions  ? Norco [hydrocodone-acetaminophen] Nausea And Vomiting  ? Dust Mite Extract Other (See Comments)  ? Oxycontin [oxycodone Hcl] Nausea And Vomiting  ? Pollen Extract Other (See Comments)  ? ?  ? ?  ?Medication List  ?  ? ?  ? Accurate as of May 14, 2021 11:59 PM. If you have any questions, ask your nurse or doctor.  ?  ?  ? ?  ? ?acetaminophen 500 MG tablet ?Commonly known as: TYLENOL ?Take 25-500 mg by mouth every 6 (six) hours as needed for mild pain or headache. ?  ?azelastine 0.1 % nasal spray ?Commonly known as: ASTELIN ?Place 1 to 2 sprays each nostril 1-2 times a day as needed for runny nose/drainage down throat ?  ?clopidogrel 75 MG tablet ?Commonly known as: PLAVIX ?TAKE 1 TABLET BY MOUTH EVERY DAY ?  ?diazepam 5 MG tablet ?Commonly known as: VALIUM ?Take 0.5 tablets (2.5 mg total) by mouth as needed (anxiety). WILL NEED APPOINTMENT FOR ADDITIONAL REFILLS ?  ?FDgard 25-20.75 MG Caps ?Generic drug: [place] Oil-Levomenthol ?Take one capsule by mouth twice daily. ?  ?fluticasone 50 MCG/ACT nasal spray ?Commonly known as: FLONASE ?Place 1-2 sprays into both nostrils daily. ?  ?Ketoconazole 2 % Foam ?1 application ?  ?ketoconazole 2 % shampoo ?Commonly known as: NIZORAL ?APPLY 1 APPLICATION TOPICALLY TWICE WEEKLY AS NEEDED FOR SCALP IRRITATION. ?  ?losartan 100 MG tablet ?Commonly known as: COZAAR ?Take 1 tablet (100 mg total) by mouth daily. Appointment due prior to additional refills ?  ?nitroGLYCERIN 0.4 MG  SL tablet ?Commonly known as: NITROSTAT ?Place 1 tablet (0.4 mg total) under the tongue every 5 (five) minutes as needed for up to 25 days for chest pain. ?  ?omeprazole 40 MG capsule ?Commonly known as: PRILOSEC ?Take 40 mg by mouth as needed. ?  ?pravastatin 40 MG tablet ?Commonly known as: PRAVACHOL ?TAKE 1 TABLET BY MOUTH EVERYDAY AT BEDTIME ?  ?Synthroid 100 MCG tablet ?Generic drug: levothyroxine ?TAKE 1 TABLET BY MOUTH EVERY DAY BEFORE BREAKFAST ?  ?Zolpidem Tartrate 3.5 MG Subl ?Take 1 tablet by mouth at bedtime as needed. ?  ? ?  ? ? ?Review of Systems:  ?Review of Systems  ?Constitutional:  Negative for fatigue, fever and unexpected weight change.  ?HENT:  Positive for congestion, hearing loss and sinus pressure. Negative for voice change.   ?     Chronic sinusitis, underwent ENT, sleep study-sleep apnea.   ?Eyes:  Positive for visual disturbance.  ?     Left lateral visual field deficit  ?Respiratory:  Negative for cough and shortness of breath.   ?Cardiovascular:  Negative for leg swelling.  ?Gastrointestinal:  Negative for abdominal pain and constipation.  ?Genitourinary:  Positive for frequency. Negative for dysuria and urgency.  ?     Urinary leakage. Doing Kegel exercise.   ?Musculoskeletal:  Negative for back pain and gait problem.  ?Skin:  Negative for color change.  ?Neurological:  Negative for speech difficulty, weakness and headaches.  ?Psychiatric/Behavioral:  Positive for sleep disturbance. Negative for dysphoric mood. The patient is not nervous/anxious.   ?     Early am awake, difficulty returning asleep.   ? ?Health Maintenance  ?Topic Date Due  ? Zoster Vaccines- Shingrix (2 of 2) 02/14/2019  ? COVID-19 Vaccine (4 - Booster for Moderna series) 06/02/2021 (Originally 07/31/2020)  ? TETANUS/TDAP  04/03/2022 (Originally 11/26/2018)  ? INFLUENZA VACCINE  08/18/2021  ? COLONOSCOPY (Pts 45-68yr Insurance coverage will need to be confirmed)  10/25/2021  ? Pneumonia Vaccine 86 Years old  Completed   ? DEXA SCAN  Completed  ? HPV VACCINES  Aged Out  ? ? ?Physical Exam: ?Vitals:  ? 05/14/21 1537  ?BP: 130/72  ?Pulse: 68  ?Resp: 20  ?Temp: (!) 96.7 ?F (35.9 ?C)  ?  SpO2: 96%  ?Weight: 156 lb (70.8 kg)  ?Height: '5\' 2"'$  (1.575 m)  ? ?Body mass index is 28.53 kg/m?Marland Kitchen ?Physical Exam ?Vitals and nursing note reviewed.  ?Constitutional:   ?   Appearance: Normal appearance.  ?HENT:  ?   Head: Normocephalic and atraumatic.  ?   Nose: Nose normal.  ?   Mouth/Throat:  ?   Mouth: Mucous membranes are moist.  ?Eyes:  ?   Extraocular Movements: Extraocular movements intact.  ?   Conjunctiva/sclera: Conjunctivae normal.  ?   Pupils: Pupils are equal, round, and reactive to light.  ?   Comments: Left peripheral visual field deficit since CVA 2006  ?Cardiovascular:  ?   Rate and Rhythm: Normal rate and regular rhythm.  ?   Heart sounds: No murmur heard. ?Pulmonary:  ?   Effort: Pulmonary effort is normal.  ?   Breath sounds: Rales present.  ?   Comments: Bibasilar rales.  ?Abdominal:  ?   General: Bowel sounds are normal.  ?   Palpations: Abdomen is soft.  ?   Tenderness: There is no abdominal tenderness.  ?Musculoskeletal:  ?   Cervical back: Normal range of motion and neck supple.  ?   Right lower leg: No edema.  ?   Left lower leg: No edema.  ?Skin: ?   General: Skin is warm and dry.  ?   Findings: Bruising present.  ?   Comments: Left inner thigh ecchymoses  ?Neurological:  ?   General: No focal deficit present.  ?   Mental Status: She is alert and oriented to person, place, and time. Mental status is at baseline.  ?   Gait: Gait normal.  ?Psychiatric:     ?   Mood and Affect: Mood normal.     ?   Behavior: Behavior normal.     ?   Thought Content: Thought content normal.     ?   Judgment: Judgment normal.  ? ? ?Labs reviewed: ?Basic Metabolic Panel: ?Recent Labs  ?  08/29/20 ?6553 12/02/20 ?0735 05/12/21 ?1153  ?NA 142  --  144  ?K 3.7  --  4.5  ?CL 109  --  106  ?CO2 26  --  24  ?GLUCOSE 126*  --  79  ?BUN 17  --  14   ?CREATININE 0.74  --  0.79  ?CALCIUM 9.7  --  9.9  ?TSH  --  1.80  --   ? ?Liver Function Tests: ?No results for input(s): AST, ALT, ALKPHOS, BILITOT, PROT, ALBUMIN in the last 8760 hours. ?No results for input

## 2021-05-15 ENCOUNTER — Encounter: Payer: Self-pay | Admitting: Nurse Practitioner

## 2021-06-06 ENCOUNTER — Encounter (HOSPITAL_COMMUNITY): Payer: Self-pay

## 2021-06-06 ENCOUNTER — Emergency Department (HOSPITAL_COMMUNITY): Payer: Medicare HMO

## 2021-06-06 ENCOUNTER — Other Ambulatory Visit: Payer: Self-pay

## 2021-06-06 ENCOUNTER — Inpatient Hospital Stay (HOSPITAL_COMMUNITY)
Admission: EM | Admit: 2021-06-06 | Discharge: 2021-06-08 | DRG: 871 | Disposition: A | Discharge status: Home / Self Care | Payer: Medicare HMO | Attending: Internal Medicine | Admitting: Internal Medicine

## 2021-06-06 DIAGNOSIS — A419 Sepsis, unspecified organism: Secondary | ICD-10-CM | POA: Diagnosis not present

## 2021-06-06 DIAGNOSIS — Z8249 Family history of ischemic heart disease and other diseases of the circulatory system: Secondary | ICD-10-CM

## 2021-06-06 DIAGNOSIS — Z808 Family history of malignant neoplasm of other organs or systems: Secondary | ICD-10-CM | POA: Diagnosis not present

## 2021-06-06 DIAGNOSIS — Z86006 Personal history of melanoma in-situ: Secondary | ICD-10-CM

## 2021-06-06 DIAGNOSIS — E872 Acidosis, unspecified: Secondary | ICD-10-CM | POA: Diagnosis present

## 2021-06-06 DIAGNOSIS — R6520 Severe sepsis without septic shock: Secondary | ICD-10-CM | POA: Diagnosis present

## 2021-06-06 DIAGNOSIS — K219 Gastro-esophageal reflux disease without esophagitis: Secondary | ICD-10-CM | POA: Diagnosis present

## 2021-06-06 DIAGNOSIS — Z8673 Personal history of transient ischemic attack (TIA), and cerebral infarction without residual deficits: Secondary | ICD-10-CM | POA: Diagnosis not present

## 2021-06-06 DIAGNOSIS — J9601 Acute respiratory failure with hypoxia: Secondary | ICD-10-CM | POA: Diagnosis present

## 2021-06-06 DIAGNOSIS — E785 Hyperlipidemia, unspecified: Secondary | ICD-10-CM | POA: Diagnosis present

## 2021-06-06 DIAGNOSIS — Z79890 Hormone replacement therapy (postmenopausal): Secondary | ICD-10-CM | POA: Diagnosis not present

## 2021-06-06 DIAGNOSIS — Z7902 Long term (current) use of antithrombotics/antiplatelets: Secondary | ICD-10-CM

## 2021-06-06 DIAGNOSIS — Z20822 Contact with and (suspected) exposure to covid-19: Secondary | ICD-10-CM | POA: Diagnosis present

## 2021-06-06 DIAGNOSIS — R0602 Shortness of breath: Secondary | ICD-10-CM

## 2021-06-06 DIAGNOSIS — Z885 Allergy status to narcotic agent status: Secondary | ICD-10-CM | POA: Diagnosis not present

## 2021-06-06 DIAGNOSIS — R053 Chronic cough: Secondary | ICD-10-CM | POA: Diagnosis present

## 2021-06-06 DIAGNOSIS — Z801 Family history of malignant neoplasm of trachea, bronchus and lung: Secondary | ICD-10-CM | POA: Diagnosis not present

## 2021-06-06 DIAGNOSIS — J1289 Other viral pneumonia: Secondary | ICD-10-CM | POA: Diagnosis present

## 2021-06-06 DIAGNOSIS — F411 Generalized anxiety disorder: Secondary | ICD-10-CM | POA: Diagnosis present

## 2021-06-06 DIAGNOSIS — I10 Essential (primary) hypertension: Secondary | ICD-10-CM | POA: Diagnosis present

## 2021-06-06 DIAGNOSIS — E039 Hypothyroidism, unspecified: Secondary | ICD-10-CM | POA: Diagnosis present

## 2021-06-06 DIAGNOSIS — R079 Chest pain, unspecified: Secondary | ICD-10-CM

## 2021-06-06 DIAGNOSIS — J312 Chronic pharyngitis: Secondary | ICD-10-CM | POA: Diagnosis present

## 2021-06-06 DIAGNOSIS — R0902 Hypoxemia: Secondary | ICD-10-CM

## 2021-06-06 DIAGNOSIS — J329 Chronic sinusitis, unspecified: Secondary | ICD-10-CM | POA: Diagnosis present

## 2021-06-06 DIAGNOSIS — Z8719 Personal history of other diseases of the digestive system: Secondary | ICD-10-CM

## 2021-06-06 DIAGNOSIS — Z823 Family history of stroke: Secondary | ICD-10-CM | POA: Diagnosis not present

## 2021-06-06 DIAGNOSIS — Z79899 Other long term (current) drug therapy: Secondary | ICD-10-CM

## 2021-06-06 DIAGNOSIS — A4189 Other specified sepsis: Principal | ICD-10-CM | POA: Diagnosis present

## 2021-06-06 DIAGNOSIS — B9719 Other enterovirus as the cause of diseases classified elsewhere: Secondary | ICD-10-CM | POA: Diagnosis present

## 2021-06-06 DIAGNOSIS — R051 Acute cough: Secondary | ICD-10-CM

## 2021-06-06 LAB — LIPASE, BLOOD: Lipase: 33 U/L (ref 11–51)

## 2021-06-06 LAB — COMPREHENSIVE METABOLIC PANEL
ALT: 15 U/L (ref 0–44)
AST: 20 U/L (ref 15–41)
Albumin: 3.8 g/dL (ref 3.5–5.0)
Alkaline Phosphatase: 64 U/L (ref 38–126)
Anion gap: 9 (ref 5–15)
BUN: 9 mg/dL (ref 8–23)
CO2: 24 mmol/L (ref 22–32)
Calcium: 9.3 mg/dL (ref 8.9–10.3)
Chloride: 109 mmol/L (ref 98–111)
Creatinine, Ser: 0.87 mg/dL (ref 0.44–1.00)
GFR, Estimated: >60 mL/min (ref >60)
Glucose, Bld: 161 mg/dL — ABNORMAL HIGH (ref 70–99)
Potassium: 4 mmol/L (ref 3.5–5.1)
Sodium: 142 mmol/L (ref 135–145)
Total Bilirubin: 0.8 mg/dL (ref 0.3–1.2)
Total Protein: 6.5 g/dL (ref 6.5–8.1)

## 2021-06-06 LAB — RESPIRATORY PANEL BY PCR

## 2021-06-06 LAB — URINALYSIS, ROUTINE W REFLEX MICROSCOPIC
Bilirubin Urine: NEGATIVE
Glucose, UA: NEGATIVE mg/dL
Hgb urine dipstick: NEGATIVE
Ketones, ur: NEGATIVE mg/dL
Leukocytes,Ua: NEGATIVE
Nitrite: NEGATIVE
Protein, ur: NEGATIVE mg/dL
Specific Gravity, Urine: 1.012 (ref 1.005–1.030)
pH: 7 (ref 5.0–8.0)

## 2021-06-06 LAB — EXPECTORATED SPUTUM ASSESSMENT W GRAM STAIN, RFLX TO RESP C

## 2021-06-06 LAB — CBC WITH DIFFERENTIAL/PLATELET
Abs Immature Granulocytes: 0.05 10*3/uL (ref 0.00–0.07)
Basophils Absolute: 0 10*3/uL (ref 0.0–0.1)
Basophils Relative: 0 %
Eosinophils Absolute: 0.1 10*3/uL (ref 0.0–0.5)
Eosinophils Relative: 1 %
HCT: 42.7 % (ref 36.0–46.0)
Hemoglobin: 13.9 g/dL (ref 12.0–15.0)
Immature Granulocytes: 0 %
Lymphocytes Relative: 9 %
Lymphs Abs: 1 10*3/uL (ref 0.7–4.0)
MCH: 30 pg (ref 26.0–34.0)
MCHC: 32.6 g/dL (ref 30.0–36.0)
MCV: 92 fL (ref 80.0–100.0)
Monocytes Absolute: 0.4 10*3/uL (ref 0.1–1.0)
Monocytes Relative: 4 %
Neutro Abs: 9.6 10*3/uL — ABNORMAL HIGH (ref 1.7–7.7)
Neutrophils Relative %: 86 %
Platelets: 229 10*3/uL (ref 150–400)
RBC: 4.64 MIL/uL (ref 3.87–5.11)
RDW: 12.7 % (ref 11.5–15.5)
WBC: 11.1 10*3/uL — ABNORMAL HIGH (ref 4.0–10.5)
nRBC: 0 % (ref 0.0–0.2)

## 2021-06-06 LAB — STREP PNEUMONIAE URINARY ANTIGEN: Strep Pneumo Urinary Antigen: NEGATIVE

## 2021-06-06 LAB — LACTIC ACID, PLASMA
Lactic Acid, Venous: 2.2 mmol/L (ref 0.5–1.9)
Lactic Acid, Venous: 2.1 mmol/L (ref 0.5–1.9)

## 2021-06-06 LAB — PROTIME-INR
INR: 1 (ref 0.8–1.2)
Prothrombin Time: 12.8 seconds (ref 11.4–15.2)

## 2021-06-06 LAB — RESP PANEL BY RT-PCR (FLU A&B, COVID) ARPGX2
Influenza A by PCR: NEGATIVE
Influenza B by PCR: NEGATIVE
SARS Coronavirus 2 by RT PCR: NEGATIVE

## 2021-06-06 LAB — TROPONIN I (HIGH SENSITIVITY)
Troponin I (High Sensitivity): <2 ng/L (ref <18)
Troponin I (High Sensitivity): 3 ng/L (ref <18)

## 2021-06-06 LAB — PROCALCITONIN: Procalcitonin: <0.1 ng/mL

## 2021-06-06 LAB — MRSA NEXT GEN BY PCR, NASAL: MRSA by PCR Next Gen: NOT DETECTED

## 2021-06-06 LAB — APTT: aPTT: 23 seconds — ABNORMAL LOW (ref 24–36)

## 2021-06-06 LAB — D-DIMER, QUANTITATIVE: D-Dimer, Quant: 0.37 ug/mL-FEU (ref 0.00–0.50)

## 2021-06-06 IMAGING — DX DG CHEST 1V PORT
1 series · 1 of 1 positions shown · non-contrast
Comparison: [DATE]

CLINICAL DATA: Chest pain and shortness of breath.  Cough.

EXAM:
PORTABLE CHEST 1 VIEW

[chest]
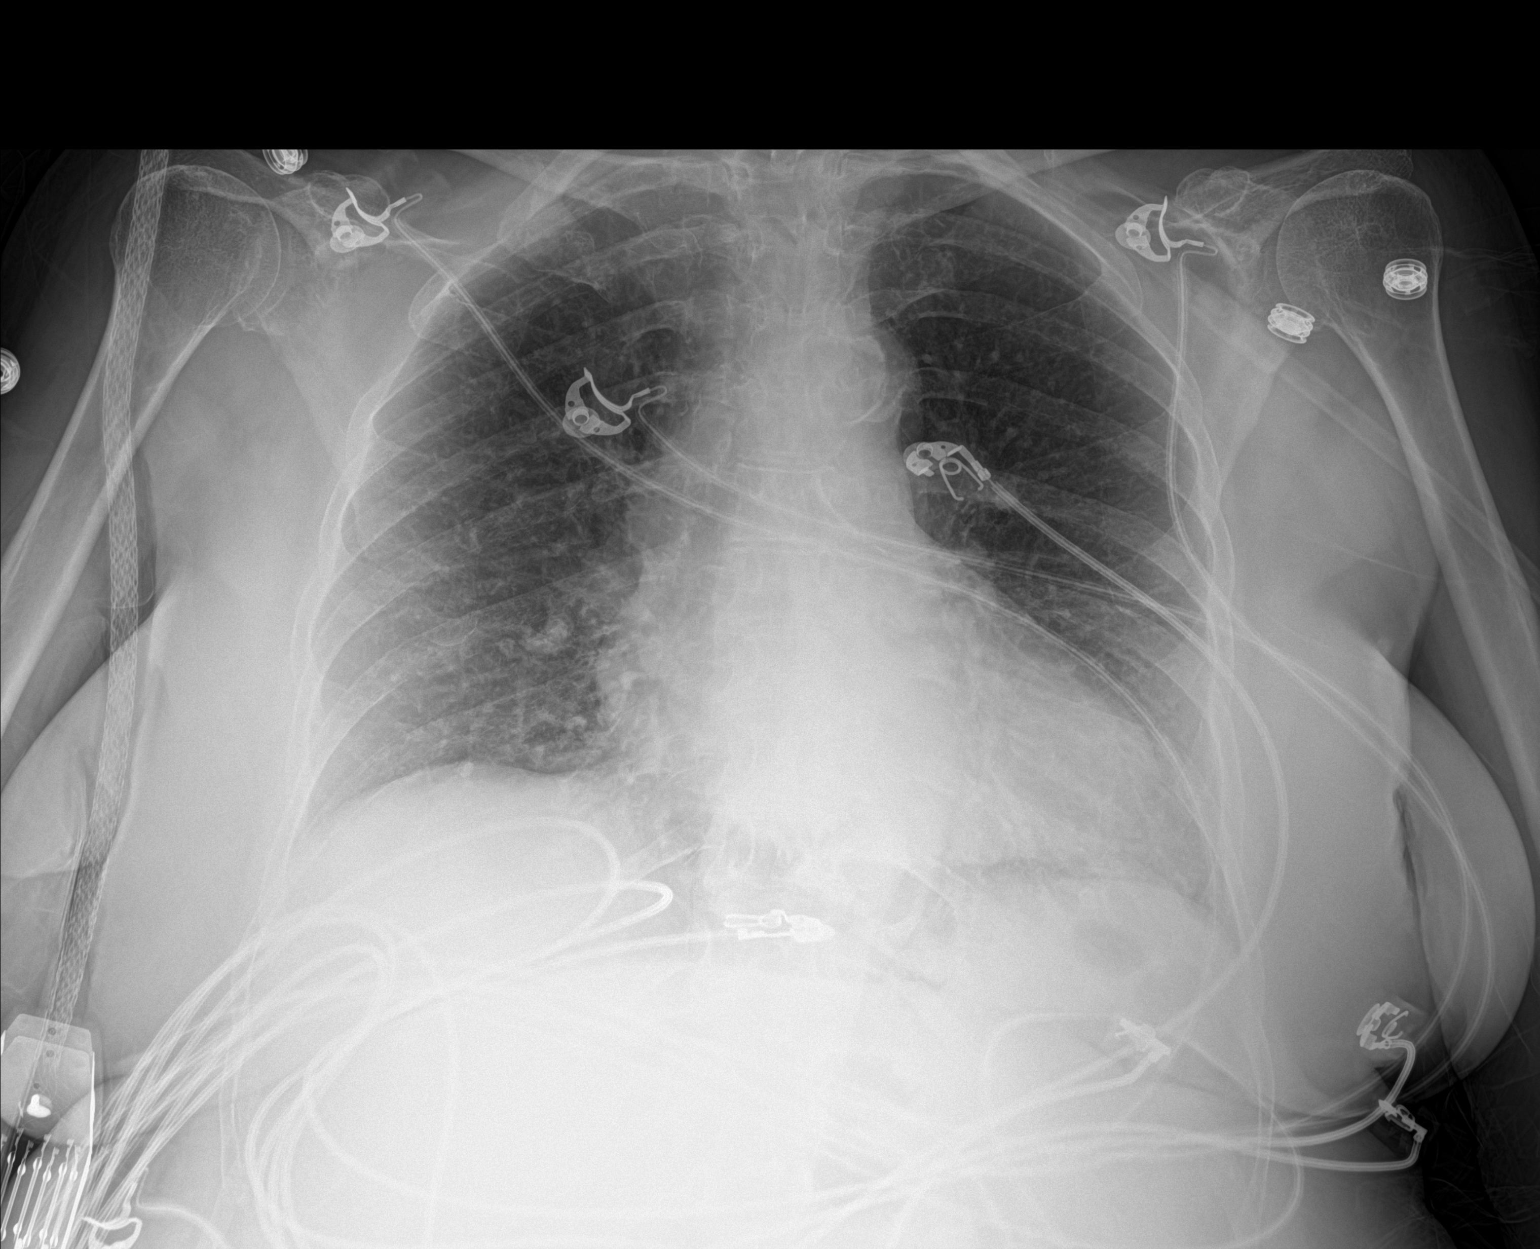

[1 of 1 positions shown; findings below may reference images not displayed]

FINDINGS: The heart size and mediastinal contours are within normal limits.
Both lungs are clear. The visualized skeletal structures are
unremarkable.
IMPRESSION: No active disease.

## 2021-06-06 MED ORDER — NITROGLYCERIN 0.4 MG SL SUBL: 0.4000 mg | SUBLINGUAL_TABLET | SUBLINGUAL | Status: DC | PRN

## 2021-06-06 MED ORDER — POLYETHYL GLYCOL-PROPYL GLYCOL 0.4-0.3 % OP SOLN: 1.0000 [drp] | Freq: Every morning | OPHTHALMIC | Status: DC

## 2021-06-06 MED ORDER — METRONIDAZOLE 500 MG/100ML IV SOLN
500.0000 mg | Freq: Once | INTRAVENOUS | Status: AC
  Administered 2021-06-06: 500 mg via INTRAVENOUS
  Filled 2021-06-06: qty 100

## 2021-06-06 MED ORDER — ALBUTEROL SULFATE (2.5 MG/3ML) 0.083% IN NEBU: 2.5000 mg | INHALATION_SOLUTION | RESPIRATORY_TRACT | Status: DC | PRN

## 2021-06-06 MED ORDER — LACTATED RINGERS IV BOLUS (SEPSIS)
250.0000 mL | Freq: Once | INTRAVENOUS | Status: AC
  Administered 2021-06-06: 250 mL via INTRAVENOUS

## 2021-06-06 MED ORDER — PANTOPRAZOLE SODIUM 40 MG PO TBEC
40.0000 mg | DELAYED_RELEASE_TABLET | Freq: Every day | ORAL | Status: DC
Start: 2021-06-06 — End: 2021-06-08
  Administered 2021-06-06 – 2021-06-08 (×3): 40 mg via ORAL
  Filled 2021-06-06 (×3): qty 1

## 2021-06-06 MED ORDER — VANCOMYCIN HCL 1250 MG/250ML IV SOLN
1250.0000 mg | INTRAVENOUS | Status: AC
  Administered 2021-06-06: 1250 mg via INTRAVENOUS
  Filled 2021-06-06: qty 250

## 2021-06-06 MED ORDER — DIAZEPAM 5 MG PO TABS: 2.5000 mg | ORAL_TABLET | Freq: Two times a day (BID) | ORAL | Status: DC | PRN

## 2021-06-06 MED ORDER — ONDANSETRON HCL 4 MG PO TABS: 4.0000 mg | ORAL_TABLET | Freq: Four times a day (QID) | ORAL | Status: DC | PRN

## 2021-06-06 MED ORDER — SENNOSIDES-DOCUSATE SODIUM 8.6-50 MG PO TABS: 1.0000 | ORAL_TABLET | Freq: Every evening | ORAL | Status: DC | PRN

## 2021-06-06 MED ORDER — SODIUM CHLORIDE 0.9 % IV SOLN
2.0000 g | Freq: Once | INTRAVENOUS | Status: AC
  Administered 2021-06-06: 2 g via INTRAVENOUS
  Filled 2021-06-06: qty 12.5

## 2021-06-06 MED ORDER — VIBEGRON 75 MG PO TABS: 75.0000 mg | ORAL_TABLET | Freq: Every day | ORAL | Status: DC

## 2021-06-06 MED ORDER — GUAIFENESIN ER 600 MG PO TB12
600.0000 mg | ORAL_TABLET | Freq: Two times a day (BID) | ORAL | Status: DC
  Administered 2021-06-06 – 2021-06-08 (×5): 600 mg via ORAL
  Filled 2021-06-06 (×5): qty 1

## 2021-06-06 MED ORDER — CLOPIDOGREL BISULFATE 75 MG PO TABS
75.0000 mg | ORAL_TABLET | Freq: Every day | ORAL | Status: DC
  Administered 2021-06-07 – 2021-06-08 (×2): 75 mg via ORAL
  Filled 2021-06-06 (×2): qty 1

## 2021-06-06 MED ORDER — ONDANSETRON HCL 4 MG/2ML IJ SOLN
4.0000 mg | Freq: Four times a day (QID) | INTRAMUSCULAR | Status: DC | PRN
Start: 2021-06-06 — End: 2021-06-08

## 2021-06-06 MED ORDER — LEVOTHYROXINE SODIUM 100 MCG PO TABS
100.0000 ug | ORAL_TABLET | Freq: Every day | ORAL | Status: DC
  Administered 2021-06-07 – 2021-06-08 (×2): 100 ug via ORAL
  Filled 2021-06-06 (×2): qty 1

## 2021-06-06 MED ORDER — MIRABEGRON ER 25 MG PO TB24
25.0000 mg | ORAL_TABLET | Freq: Every day | ORAL | Status: DC
  Administered 2021-06-08: 25 mg via ORAL
  Filled 2021-06-06 (×3): qty 1

## 2021-06-06 MED ORDER — LACTATED RINGERS IV BOLUS (SEPSIS)
1000.0000 mL | Freq: Once | INTRAVENOUS | Status: AC
  Administered 2021-06-06: 1000 mL via INTRAVENOUS

## 2021-06-06 MED ORDER — ACETAMINOPHEN 650 MG RE SUPP
650.0000 mg | Freq: Four times a day (QID) | RECTAL | Status: DC | PRN
Start: 2021-06-06 — End: 2021-06-08

## 2021-06-06 MED ORDER — FLUTICASONE PROPIONATE 50 MCG/ACT NA SUSP
2.0000 | Freq: Every day | NASAL | Status: DC
  Administered 2021-06-06 – 2021-06-07 (×2): 2 via NASAL
  Filled 2021-06-06: qty 16

## 2021-06-06 MED ORDER — ZOLPIDEM TARTRATE 5 MG PO TABS
5.0000 mg | ORAL_TABLET | Freq: Every evening | ORAL | Status: DC | PRN
  Administered 2021-06-06 – 2021-06-08 (×2): 5 mg via ORAL
  Filled 2021-06-06 (×3): qty 1

## 2021-06-06 MED ORDER — VANCOMYCIN HCL IN DEXTROSE 1-5 GM/200ML-% IV SOLN
1000.0000 mg | INTRAVENOUS | Status: DC
  Filled 2021-06-06: qty 200

## 2021-06-06 MED ORDER — ENOXAPARIN SODIUM 40 MG/0.4ML IJ SOSY
40.0000 mg | PREFILLED_SYRINGE | INTRAMUSCULAR | Status: DC
  Administered 2021-06-06 – 2021-06-07 (×2): 40 mg via SUBCUTANEOUS
  Filled 2021-06-06 (×2): qty 0.4

## 2021-06-06 MED ORDER — ZOLPIDEM TARTRATE 3.5 MG SL SUBL: 1.0000 | SUBLINGUAL_TABLET | Freq: Every evening | SUBLINGUAL | Status: DC | PRN

## 2021-06-06 MED ORDER — GUAIFENESIN-DM 100-10 MG/5ML PO SYRP: 10.0000 mL | ORAL_SOLUTION | ORAL | Status: DC | PRN

## 2021-06-06 MED ORDER — ACETAMINOPHEN 325 MG PO TABS: 650.0000 mg | ORAL_TABLET | Freq: Four times a day (QID) | ORAL | Status: DC | PRN

## 2021-06-06 MED ORDER — LORATADINE 10 MG PO TABS
10.0000 mg | ORAL_TABLET | Freq: Every day | ORAL | Status: DC
  Administered 2021-06-06 – 2021-06-08 (×3): 10 mg via ORAL
  Filled 2021-06-06 (×3): qty 1

## 2021-06-06 MED ORDER — VANCOMYCIN HCL IN DEXTROSE 1-5 GM/200ML-% IV SOLN: 1000.0000 mg | Freq: Once | INTRAVENOUS | Status: DC

## 2021-06-06 MED ORDER — LACTATED RINGERS IV SOLN: INTRAVENOUS | Status: AC

## 2021-06-06 MED ORDER — AZELASTINE HCL 0.1 % NA SOLN
1.0000 | Freq: Two times a day (BID) | NASAL | Status: DC
  Administered 2021-06-06 – 2021-06-08 (×5): 1 via NASAL
  Filled 2021-06-06: qty 30

## 2021-06-06 MED ORDER — PRAVASTATIN SODIUM 40 MG PO TABS
40.0000 mg | ORAL_TABLET | Freq: Every day | ORAL | Status: DC
  Administered 2021-06-07: 40 mg via ORAL
  Filled 2021-06-06 (×2): qty 1

## 2021-06-06 MED ORDER — SODIUM CHLORIDE 0.9 % IV SOLN
2.0000 g | Freq: Two times a day (BID) | INTRAVENOUS | Status: DC
  Administered 2021-06-06 – 2021-06-07 (×2): 2 g via INTRAVENOUS
  Filled 2021-06-06 (×2): qty 12.5

## 2021-06-06 MED ORDER — NAPHAZOLINE-GLYCERIN 0.012-0.25 % OP SOLN
1.0000 [drp] | Freq: Two times a day (BID) | OPHTHALMIC | Status: DC
  Administered 2021-06-06 – 2021-06-08 (×5): 1 [drp] via OPHTHALMIC
  Filled 2021-06-06: qty 15

## 2021-06-06 NOTE — ED Provider Notes (Signed)
[facility] EMERGENCY DEPARTMENT Provider Note   CSN: [id number] Arrival date & time: 06/06/21  0747     History  No chief complaint on file.   Darlene [name] is a 88 y.o. female.  The history is provided by the patient, medical records and the EMS personnel. No language interpreter was used.  Cough Cough characteristics:  Productive Sputum characteristics:  Nondescript Severity:  Severe Onset quality:  Sudden Duration:  2 days Timing:  Constant Progression:  Waxing and waning Chronicity:  New Relieved by:  Nothing Worsened by:  Nothing Associated symptoms: chest pain, chills and shortness of breath   Associated symptoms: no diaphoresis, no headaches, no rash, no rhinorrhea and no wheezing       Home Medications Prior to Admission medications   Medication Sig Start Date End Date Taking? Authorizing Provider  acetaminophen (TYLENOL) 500 MG tablet Take 25-500 mg by mouth every 6 (six) hours as needed for mild pain or headache.    [provider]  azelastine (ASTELIN) 0.1 % nasal spray Place 1 to 2 sprays each nostril 1-2 times a day as needed for runny nose/drainage down throat 07/31/20   Althea Charon, FNP  Cara[place] Oil-Levomenthol (FDGARD) 25-20.75 MG CAPS Take one capsule by mouth twice daily. 04/03/21   [name], [name], NP  clopidogrel (PLAVIX) 75 MG tablet TAKE 1 TABLET BY MOUTH EVERY DAY 02/02/21   [name], [name], NP  diazepam (VALIUM) 5 MG tablet Take 0.5 tablets (2.5 mg total) by mouth as needed (anxiety). WILL NEED APPOINTMENT FOR ADDITIONAL REFILLS 10/30/20   [name], [name], NP  fluticasone (FLONASE) 50 MCG/ACT nasal spray Place 1-2 sprays into both nostrils daily. 03/19/21   [name], [name], NP  ketoconazole (NIZORAL) 2 % shampoo APPLY 1 APPLICATION TOPICALLY TWICE WEEKLY AS NEEDED FOR SCALP IRRITATION. 03/02/21   Lavonna Monarch, MD  Ketoconazole 2 % FOAM 1 application    [provider]  losartan (COZAAR) 100 MG tablet Take 1 tablet (100 mg total)  by mouth daily. Appointment due prior to additional refills 05/08/21   Cantwell, Celeste C, PA-C  nitroGLYCERIN (NITROSTAT) 0.4 MG SL tablet Place 1 tablet (0.4 mg total) under the tongue every 5 (five) minutes as needed for up to 25 days for chest pain. 04/04/20 10/19/22  Adrian Prows, MD  omeprazole (PRILOSEC) 40 MG capsule Take 40 mg by mouth as needed.    [provider]  pravastatin (PRAVACHOL) 40 MG tablet TAKE 1 TABLET BY MOUTH EVERYDAY AT BEDTIME 03/31/21   Cantwell, Celeste C, PA-C  SYNTHROID 100 MCG tablet TAKE 1 TABLET BY MOUTH EVERY DAY BEFORE BREAKFAST 04/29/21   [name], [name], NP  Zolpidem Tartrate 3.5 MG SUBL Take 1 tablet by mouth at bedtime as needed. 12/26/20   [name], [name], NP      Allergies    Norco [hydrocodone-acetaminophen], Dust mite extract, Oxycontin [oxycodone hcl], and Pollen extract    Review of Systems   Review of Systems  Constitutional:  Positive for chills and fatigue. Negative for diaphoresis.  HENT:  Positive for congestion. Negative for rhinorrhea.   Respiratory:  Positive for cough, chest tightness and shortness of breath. Negative for wheezing.   Cardiovascular:  Positive for chest pain. Negative for palpitations and leg swelling.  Gastrointestinal:  Positive for nausea and vomiting (after nitro). Negative for abdominal pain, constipation and diarrhea.  Genitourinary:  Negative for dysuria and flank pain.  Musculoskeletal:  Negative for back pain, neck pain and neck stiffness.  Skin:  Negative for rash and wound.  Neurological:  Negative for light-headedness and headaches.  Psychiatric/Behavioral:  Negative for agitation and confusion.   All other systems reviewed and are negative.  Physical Exam Updated Vital Signs There were no vitals taken for this visit. Physical Exam Vitals and nursing note reviewed.  Constitutional:      General: She is not in acute distress.    Appearance: She is well-developed. She is ill-appearing. She is not  toxic-appearing or diaphoretic.  HENT:     Head: Normocephalic and atraumatic.     Nose: No congestion or rhinorrhea.     Mouth/Throat:     Mouth: Mucous membranes are dry.     Pharynx: No oropharyngeal exudate or posterior oropharyngeal erythema.  Eyes:     Extraocular Movements: Extraocular movements intact.     Conjunctiva/sclera: Conjunctivae normal.     Pupils: Pupils are equal, round, and reactive to light.  Cardiovascular:     Rate and Rhythm: Regular rhythm. Tachycardia present.     Heart sounds: No murmur heard. Pulmonary:     Effort: Pulmonary effort is normal. No respiratory distress.     Breath sounds: Rhonchi present. No wheezing or rales.  Chest:     Chest wall: No tenderness.  Abdominal:     General: Abdomen is flat.     Palpations: Abdomen is soft.     Tenderness: There is no abdominal tenderness. There is no guarding or rebound.  Musculoskeletal:        General: No swelling or tenderness.     Cervical back: Neck supple. No tenderness.     Right lower leg: No edema.     Left lower leg: No edema.  Skin:    General: Skin is warm and dry.     Capillary Refill: Capillary refill takes less than 2 seconds.     Findings: No erythema.  Neurological:     General: No focal deficit present.     Mental Status: She is alert.  Psychiatric:        Mood and Affect: Mood normal.    ED Results / Procedures / Treatments   Labs (all labs ordered are listed, but only abnormal results are displayed) Labs Reviewed  CBC WITH DIFFERENTIAL/PLATELET - Abnormal; Notable for the following components:      Result Value   WBC 11.1 (*)    Neutro Abs 9.6 (*)    All other components within normal limits  COMPREHENSIVE METABOLIC PANEL - Abnormal; Notable for the following components:   Glucose, Bld 161 (*)    All other components within normal limits  LACTIC ACID, PLASMA - Abnormal; Notable for the following components:   Lactic Acid, Venous 2.2 (*)    All other components within  normal limits  LACTIC ACID, PLASMA - Abnormal; Notable for the following components:   Lactic Acid, Venous 2.1 (*)    All other components within normal limits  APTT - Abnormal; Notable for the following components:   aPTT 23 (*)    All other components within normal limits  URINALYSIS, ROUTINE W REFLEX MICROSCOPIC - Abnormal; Notable for the following components:   Color, Urine STRAW (*)    All other components within normal limits  RESP PANEL BY RT-PCR (FLU A&B, COVID) ARPGX2  CULTURE, BLOOD (ROUTINE X 2)  CULTURE, BLOOD (ROUTINE X 2)  URINE CULTURE  EXPECTORATED SPUTUM ASSESSMENT W GRAM STAIN, RFLX TO RESP C  MRSA NEXT GEN BY PCR, NASAL  RESPIRATORY PANEL BY  PCR  LIPASE, BLOOD  D-DIMER, QUANTITATIVE  PROTIME-INR  PROCALCITONIN  STREP PNEUMONIAE URINARY ANTIGEN  LEGIONELLA PNEUMOPHILA SEROGP 1 UR AG  TROPONIN I (HIGH SENSITIVITY)  TROPONIN I (HIGH SENSITIVITY)    EKG EKG Interpretation  Date/Time:  Saturday Jun 06 2021 07:58:54 EDT Ventricular Rate:  72 PR Interval:  183 QRS Duration: 83 QT Interval:  389 QTC Calculation: 426 R Axis:   52 Text Interpretation: Sinus rhythm Low voltage, precordial leads when compared to prior, similar appearance. No STEMI Confirmed by Antony Blackbird [phone]) on 06/06/2021 8:15:33 AM  Radiology DG Chest Portable 1 View  Result Date: 06/06/2021 CLINICAL DATA:  Chest pain and shortness of breath.  Cough. EXAM: PORTABLE CHEST 1 VIEW COMPARISON:  August 29, 2020 FINDINGS: The heart size and mediastinal contours are within normal limits. Both lungs are clear. The visualized skeletal structures are unremarkable. IMPRESSION: No active disease. Electronically Signed   By: Dorise Bullion III M.D.   On: 06/06/2021 09:22    Procedures Procedures    CRITICAL CARE Performed by: Gwenyth Allegra Teneshia Hedeen Total critical care time: 45 minutes Critical care time was exclusive of separately billable procedures and treating other patients. Critical care  was necessary to treat or prevent imminent or life-threatening deterioration. Critical care was time spent personally by me on the following activities: development of treatment plan with patient and/or surrogate as well as nursing, discussions with consultants, evaluation of patient's response to treatment, examination of patient, obtaining history from patient or surrogate, ordering and performing treatments and interventions, ordering and review of laboratory studies, ordering and review of radiographic studies, pulse oximetry and re-evaluation of patient's condition.   Medications Ordered in ED Medications  lactated ringers infusion ( Intravenous New Bag/Given 06/06/21 1059)  ceFEPIme (MAXIPIME) 2 g in sodium chloride 0.9 % 100 mL IVPB (has no administration in time range)  vancomycin (VANCOCIN) IVPB 1000 mg/200 mL premix (has no administration in time range)  enoxaparin (LOVENOX) injection 40 mg (40 mg Subcutaneous Given 06/06/21 1316)  acetaminophen (TYLENOL) tablet 650 mg (has no administration in time range)    Or  acetaminophen (TYLENOL) suppository 650 mg (has no administration in time range)  senna-docusate (Senokot-S) tablet 1 tablet (has no administration in time range)  ondansetron (ZOFRAN) tablet 4 mg (has no administration in time range)    Or  ondansetron (ZOFRAN) injection 4 mg (has no administration in time range)  albuterol (PROVENTIL) (2.5 MG/3ML) 0.083% nebulizer solution 2.5 mg (has no administration in time range)  guaiFENesin (MUCINEX) 12 hr tablet 600 mg (600 mg Oral Given 06/06/21 1316)  loratadine (CLARITIN) tablet 10 mg (10 mg Oral Given 06/06/21 1316)  fluticasone (FLONASE) 50 MCG/ACT nasal spray 2 spray (has no administration in time range)  guaiFENesin-dextromethorphan (ROBITUSSIN DM) 100-10 MG/5ML syrup 10 mL (has no administration in time range)  azelastine (ASTELIN) 0.1 % nasal spray 1 spray (has no administration in time range)  clopidogrel (PLAVIX) tablet 75 mg  (has no administration in time range)  diazepam (VALIUM) tablet 2.5 mg (has no administration in time range)  nitroGLYCERIN (NITROSTAT) SL tablet 0.4 mg (has no administration in time range)  pantoprazole (PROTONIX) EC tablet 40 mg (40 mg Oral Given 06/06/21 1316)  pravastatin (PRAVACHOL) tablet 40 mg (has no administration in time range)  levothyroxine (SYNTHROID) tablet 100 mcg (has no administration in time range)  Zolpidem Tartrate SUBL 1 tablet (has no administration in time range)  polyethylene glycol 0.4% and propylene glycol 0.3% (SYSTANE) ophthalmic gel (has no administration  in time range)  Polyethyl Glycol-Propyl Glycol 0.4-0.3 % SOLN 1 drop (has no administration in time range)  Vibegron TABS 75 mg (has no administration in time range)  lactated ringers bolus 1,000 mL (0 mLs Intravenous Stopped 06/06/21 1057)    And  lactated ringers bolus 1,000 mL (0 mLs Intravenous Stopped 06/06/21 1057)    And  lactated ringers bolus 250 mL (0 mLs Intravenous Stopped 06/06/21 1118)  ceFEPIme (MAXIPIME) 2 g in sodium chloride 0.9 % 100 mL IVPB (0 g Intravenous Stopped 06/06/21 0911)  metroNIDAZOLE (FLAGYL) IVPB 500 mg (0 mg Intravenous Stopped 06/06/21 0939)  vancomycin (VANCOREADY) IVPB 1250 mg/250 mL (0 mg Intravenous Stopped 06/06/21 1055)    ED Course/ Medical Decision Making/ A&P                           Medical Decision Making Problems Addressed: Sepsis, due to unspecified organism, unspecified whether acute organ dysfunction present [facility]): acute illness or injury that poses a threat to life or bodily functions  Amount and/or Complexity of Data Reviewed Labs: ordered. Radiology: ordered. ECG/medicine tests: ordered and independent interpretation performed.  Risk Prescription drug management. Decision regarding hospitalization.    Darlene [name] is a 88 y.o. female with a past medical history significant for hypertension, hyperlipidemia, previous stroke, GERD, anxiety, depression,  previous skin cancer, previous appendectomy, hiatal hernia, and hypothyroidism who presents with 2 days of chest pain, cough, shortness of breath.  According to patient and EMS, patient started having chest discomfort yesterday afternoon when she was having coughing fits.  She reports that she is been coughing up phlegm and has been short of breath with it.  EMS reports that her oxygen saturations were between 90 and 92% but she did not want to be on oxygen.  They report she was not febrile but was complaining of some shaking chills.  Patient she has had no trauma or other symptoms this week.  She denies any constipation, diarrhea, or urinary symptoms.  She does report she had nausea and vomiting after nitroglycerin was given by EMS for the chest discomfort.  She denies any other complaints.  Reports the pain is moderate to severe but only when she is coughing and taking deep breaths.  She denies history of DVT or PE.  On my exam, patient is lungs did have some rhonchi bilaterally.  Chest was nontender and I cannot reproduce the discomfort.  Did not appreciate a murmur.  Abdomen was nontender.  Patient moving all extremities and has intact pulses in extremities.  Legs are nontender and nonedematous.  Patient clearly uncomfortable with nausea, chest discomfort, shortness of breath.  Initial EKG does not show STEMI and shows a sinus rhythm.  Oxygen saturations found to be 84% on room air.  Decision made to start patient on oxygen which she is agreeable to.   8:06 AM Nursing reports that a rectal temp shows her temperature is 101.  Given the tachycardia, fever, hypoxia, and significant cough, we will make patient a code sepsis.  Anticipate admission for new hypoxia when work-up is completed  10:33 AM Chest x-ray returned reassuring however clinically she has rhonchi and I am concerned about pneumonia as the source of her sepsis.  Her labs were also reviewed and interpreted by me with a lactic acid of 2.2  with elevation.  She does have a leukocytosis of 11.1.  Metabolic panel reassuring.  Troponin negative.  D-dimer negative.  COVID and flu negative.  Lipase not elevated.  Due to patient's new hypoxia and sepsis, we will call for admission.         Final Clinical Impression(s) / ED Diagnoses Final diagnoses:  Sepsis, due to unspecified organism, unspecified whether acute organ dysfunction present ([place])  Acute cough  Chest pain, unspecified type  Shortness of breath  Hypoxia    Clinical Impression: 1. Sepsis, due to unspecified organism, unspecified whether acute organ dysfunction present ([place])   2. Acute cough   3. Chest pain, unspecified type   4. Shortness of breath   5. Hypoxia     Disposition: Admit  This note was prepared with assistance of Dragon voice recognition software. Occasional wrong-word or sound-a-like substitutions may have occurred due to the inherent limitations of voice recognition software.      Donicia Druck, Gwenyth Allegra, MD 06/06/21 1345

## 2021-06-06 NOTE — ED Notes (Signed)
Pt has [place] per nurse

## 2021-06-06 NOTE — Progress Notes (Signed)
Pharmacy Antibiotic Note  Darlene [name] is a 88 y.o. female for which pharmacy has been consulted for cefepime and vancomycin dosing for sepsis.  Patient with a history of HTN, HLD, stroke, GERD, anxiety, depression, skin cancer, appendectomy, hiatal hernia, and hypothyroidism. Patient presenting with chest pain, cough, SOB.  SCr 0.87 WBC 11.1; LA 2.2; T 101 F; HR 78; RR 21 COVID/Flu - negative  Plan: Metronidazole per MD Cefepime 2g q12hr Vancomycin 1250 mg once then 1000 mg q24hr (eAUC 553.6) unless change in renal function Trend WBC, Fever, Renal function, & Clinical course F/u cultures, clinical course, WBC, fever De-escalate when able Levels at steady state  Height: '5\' 2"'$  (157.5 cm) Weight: 70.8 kg (156 lb 1.4 oz) IBW/kg (Calculated) : 50.1  Temp (24hrs), Avg:101 F (38.3 C), Min:101 F (38.3 C), Max:101 F (38.3 C)  Recent Labs  Lab 06/06/21 0800  WBC 11.1*  CREATININE 0.87  LATICACIDVEN 2.2*    Estimated Creatinine Clearance: 43.6 mL/min (by C-G formula based on SCr of 0.87 mg/dL).    Allergies  Allergen Reactions   Norco [Hydrocodone-Acetaminophen] Nausea And Vomiting   Dust Mite Extract Other (See Comments)   Oxycontin [Oxycodone Hcl] Nausea And Vomiting   Pollen Extract Other (See Comments)    Antimicrobials this admission: cefepime 5/20 >>  metronidazole 5/20 >>  vancomycin 5/20 >>   Microbiology results: Pending  Thank you for allowing pharmacy to be a part of this patient's care.  Lorelei Pont, PharmD, BCPS 06/06/2021 10:25 AM ED Clinical Pharmacist -  [phone]

## 2021-06-06 NOTE — Sepsis Progress Note (Signed)
Elink following code sepsis °

## 2021-06-06 NOTE — H&P (Addendum)
History and Physical    Miles Borkowski WRU:[id number] DOB: [birth year] DOA: 06/06/2021  PCP: [name], [name], NP   Patient coming from:   Chief Complaints:Cough   HPI: 88 y.o. female with history of hypertension hyperlipidemia previous history of GERD, anxiety/depression, previous skin cancer, history of appendectomy hiatal hernia, hypothyroidism coming from skilled nursing facility with burning chest pain radiating into esophagus along with nonproductive cough x2 days along with shortness of breath. EMS was called patient was having chest discomfort when she was having coughing fits.  For EMS she was saturating 90 to 92%, and brought to the ED. On exam in the ED was having rhonchi bilaterally chest was nontender, EKG show sinus rhythm, initially hypoxic 82% on room air placed on nasal cannula, rectal temperature elevated 101 and had tachycardia and was concerned about sepsis/pneumonia chest x-ray no acute finding labs showed lactic acidosis 2.2 D-dimer negative COVID and influenza negative, high sensitive troponin negative x2 LFTs stable.  Blood cultures were sent, cefepime vancomycin Ringer lactate 2.25 liter bolus ordered and admission requested for management. On exam patient reports he feels some better.  Heart rate is stable blood pressure in 120s, saturating well on 3 L.  Her complaint is having chest pain with each cough and chest pain is diffuse.  Had some sputum production and complains of having "sinus issues" for the last several years and some sore throat.  She reports she is always short of breath due to her sinus problem  Assessment/Plan Principal Problem:   Sepsis ([place]) Active Problems:   Essential hypertension   History of CVA (cerebrovascular accident)   Hyperlipidemia   GAD (generalized anxiety disorder)   Acquired hypothyroidism   Chronic sinusitis   Gastroesophageal reflux disease   Chronic sore throat   Chronic cough  Sepsis POA Cough with chest pain Acute hypoxic  respiratory failure: Sepsis POA source suspecting pneumonia in the setting of patient's chronic sinus disease, also has chronic sore throat/chronic cough.  D-dimer negative, needing 2 L of cannula in the ED. Started on vancomycin/cefepime-check MRSA swab, antigen for Legionella and Streptococcus, sputum culture.  Hopefully can de-escalate antibiotics next 24 hours. Check RVP panel.Add antitussives, Claritin Flonase given patient's ongoing sinus issues sore throat which has been a chronic issue. He has ongoing chest pain will obtain CT chest without.  Check procalcitonin.  Monitor serial lactic acid received bolus fluid in the ED. Recent Labs  Lab 06/06/21 0800 06/06/21 1001  WBC 11.1*  --   LATICACIDVEN 2.2* 2.1*     Essential hypertension: BP well controlled hold losartan.  History of CVA: HLD- Once med rec completed resume Plavix, pravastatin.  GAD :Resume Valium. Acquired hypothyroidism: Resume Synthroid. GERD-followed by Dr. Rush Landmark, resume PPI follow-up  Chronic sinusitis Chronic sore throat Chronic cough: I will resume Flonase and Claritin, antitussives.  Body mass index is 28.55 kg/m.   Severity of Illness: The appropriate patient status for this patient is INPATIENT. Inpatient status is judged to be reasonable and necessary in order to provide the required intensity of service to ensure the patient's safety. The patient's presenting symptoms, physical exam findings, and initial radiographic and laboratory data in the context of their chronic comorbidities is felt to place them at high risk for further clinical deterioration. Furthermore, it is not anticipated that the patient will be medically stable for discharge from the hospital within 2 midnights of admission.   * I certify that at the point of admission it is my clinical judgment that the patient will  require inpatient hospital care spanning beyond 2 midnights from the point of admission due to high intensity of  service, high risk for further deterioration and high frequency of surveillance required.*   DVT prophylaxis: enoxaparin (LOVENOX) injection 40 mg Start: 06/06/21 1200 Code Status:   Code Status: Full Code  Family Communication: Admission, patients condition and plan of care including tests being ordered have been discussed with the patient who indicate understanding and agree with the plan and Code Status.  Consults called:  None  Review of Systems: All systems were reviewed and were negative except as mentioned in HPI above. Negative for focal weakness Negative for headache Negative for nausea vomiting or diarrhea  Past Medical History:  Diagnosis Date   Anxiety    Atypical mole 11/11/2020   Left Inner Knee (moderate-free)   Colon polyps    Depression    Hyperlipidemia    Hypertension    Hypothyroidism    Melanoma in situ ([place]) 09/19/2018   Right Arm (excision)   Skin cancer    Sleep apnea    Stroke [facility]) 2008    Past Surgical History:  Procedure Laterality Date   ADENOIDECTOMY     APPENDECTOMY  1960   CATARACT EXTRACTION Left    COLONOSCOPY     GUM SURGERY  03/2019   LUMBAR [place] SURGERY  2014   MELANOMA EXCISION  2020   MESH APPLIED TO LAP PORT     TONSILLECTOMY  1940     reports that she has never smoked. She has never used smokeless tobacco. She reports that she does not currently use alcohol. She reports that she does not use drugs.  Allergies  Allergen Reactions   Norco [Hydrocodone-Acetaminophen] Nausea And Vomiting   Dust Mite Extract Other (See Comments)   Oxycontin [Oxycodone Hcl] Nausea And Vomiting   Pollen Extract Other (See Comments)    Family History  Problem Relation Age of Onset   Lung cancer Mother 74       smoker   Heart disease Mother    Stroke Father 49   Heart attack Father    Allergic rhinitis Sister    Hypertension Sister    CVA Brother    Lung cancer Maternal Grandfather    Heart attack Paternal Grandfather    Melanoma  Daughter    Psoriasis Daughter    Rheum arthritis Daughter    Bipolar disorder Daughter    Colon cancer Neg Hx    Esophageal cancer Neg Hx    Inflammatory bowel disease Neg Hx    Liver disease Neg Hx    Pancreatic cancer Neg Hx    Stomach cancer Neg Hx    Rectal cancer Neg Hx      Prior to Admission medications   Medication Sig Start Date End Date Taking? Authorizing Provider  acetaminophen (TYLENOL) 500 MG tablet Take 25-500 mg by mouth every 6 (six) hours as needed for mild pain or headache.    [provider]  azelastine (ASTELIN) 0.1 % nasal spray Place 1 to 2 sprays each nostril 1-2 times a day as needed for runny nose/drainage down throat 07/31/20   Althea Charon, FNP  [place] Oil-Levomenthol (FDGARD) 25-20.75 MG CAPS Take one capsule by mouth twice daily. 04/03/21   [name], [name], NP  clopidogrel (PLAVIX) 75 MG tablet TAKE 1 TABLET BY MOUTH EVERY DAY 02/02/21   [name], [name], NP  diazepam (VALIUM) 5 MG tablet Take 0.5 tablets (2.5 mg total) by mouth as needed (anxiety). WILL  NEED APPOINTMENT FOR ADDITIONAL REFILLS 10/30/20   [name], [name], NP  fluticasone (FLONASE) 50 MCG/ACT nasal spray Place 1-2 sprays into both nostrils daily. 03/19/21   [name], [name], NP  ketoconazole (NIZORAL) 2 % shampoo APPLY 1 APPLICATION TOPICALLY TWICE WEEKLY AS NEEDED FOR SCALP IRRITATION. 03/02/21   Lavonna Monarch, MD  Ketoconazole 2 % FOAM 1 application    [provider]  losartan (COZAAR) 100 MG tablet Take 1 tablet (100 mg total) by mouth daily. Appointment due prior to additional refills 05/08/21   Cantwell, Celeste C, PA-C  nitroGLYCERIN (NITROSTAT) 0.4 MG SL tablet Place 1 tablet (0.4 mg total) under the tongue every 5 (five) minutes as needed for up to 25 days for chest pain. 04/04/20 10/19/22  Adrian Prows, MD  omeprazole (PRILOSEC) 40 MG capsule Take 40 mg by mouth as needed.    [provider]  pravastatin (PRAVACHOL) 40 MG tablet TAKE 1 TABLET BY MOUTH EVERYDAY AT BEDTIME 03/31/21    Cantwell, Celeste C, PA-C  SYNTHROID 100 MCG tablet TAKE 1 TABLET BY MOUTH EVERY DAY BEFORE BREAKFAST 04/29/21   [name], [name], NP  Zolpidem Tartrate 3.5 MG SUBL Take 1 tablet by mouth at bedtime as needed. 12/26/20   [name], [name], NP    Physical Exam: Vitals:   06/06/21 1000 06/06/21 1030 06/06/21 1100 06/06/21 1130  BP: (!) 113/48 (!) 114/51 (!) 111/57 (!) 120/56  Pulse: 78 80 88 86  Resp: (!) 21 17 (!) 25 (!) 29  Temp:      TempSrc:      SpO2: 100% 100% 98% 97%  Weight:      Height:        General exam: AAOx3 , NAD, weak appearing. HEENT:Oral mucosa moist, Ear/Nose WNL grossly, dentition normal. Respiratory system: bilaterally  clear, diminished at bases, no wheezing or crackles,no use of accessory muscle Cardiovascular system: S1 & S2 +, No JVD,. Gastrointestinal system: Abdomen soft, NT,ND, BS+ Nervous System:Alert, awake, moving extremities and grossly nonfocal Extremities: No edema, distal peripheral pulses palpable.  Skin: No rashes,no icterus. MSK: Normal muscle bulk,tone, power   Labs on Admission: I have personally reviewed following labs and imaging studies  CBC: Recent Labs  Lab 06/06/21 0800  WBC 11.1*  NEUTROABS 9.6*  HGB 13.9  HCT 42.7  MCV 92.0  PLT 756   Basic Metabolic Panel: Recent Labs  Lab 06/06/21 0800  NA 142  K 4.0  CL 109  CO2 24  GLUCOSE 161*  BUN 9  CREATININE 0.87  CALCIUM 9.3   GFR: Estimated Creatinine Clearance: 43.6 mL/min (by C-G formula based on SCr of 0.87 mg/dL). Liver Function Tests: Recent Labs  Lab 06/06/21 0800  AST 20  ALT 15  ALKPHOS 64  BILITOT 0.8  PROT 6.5  ALBUMIN 3.8   Recent Labs  Lab 06/06/21 0800  LIPASE 33   No results for input(s): AMMONIA in the last 168 hours. Coagulation Profile: Recent Labs  Lab 06/06/21 0800  INR 1.0   Cardiac Enzymes: No results for input(s): CKTOTAL, CKMB, CKMBINDEX, TROPONINI in the last 168 hours. BNP (last 3 results) No results for input(s): PROBNP in the last  8760 hours. HbA1C: No results for input(s): HGBA1C in the last 72 hours. CBG: No results for input(s): GLUCAP in the last 168 hours. Lipid Profile: No results for input(s): CHOL, HDL, LDLCALC, TRIG, CHOLHDL, LDLDIRECT in the last 72 hours. Thyroid Function Tests: No results for input(s): TSH, T4TOTAL, FREET4, T3FREE, THYROIDAB in the last  72 hours. Anemia Panel: No results for input(s): VITAMINB12, FOLATE, FERRITIN, TIBC, IRON, RETICCTPCT in the last 72 hours. Urine analysis:    Component Value Date/Time   COLORURINE STRAW (A) 06/06/2021 1105   APPEARANCEUR CLEAR 06/06/2021 1105   LABSPEC 1.012 06/06/2021 1105   PHURINE 7.0 06/06/2021 1105   GLUCOSEU NEGATIVE 06/06/2021 1105   HGBUR NEGATIVE 06/06/2021 1105   BILIRUBINUR NEGATIVE 06/06/2021 1105   KETONESUR NEGATIVE 06/06/2021 1105   PROTEINUR NEGATIVE 06/06/2021 1105   NITRITE NEGATIVE 06/06/2021 1105   LEUKOCYTESUR NEGATIVE 06/06/2021 1105    Radiological Exams on Admission: DG Chest Portable 1 View  Result Date: 06/06/2021 CLINICAL DATA:  Chest pain and shortness of breath.  Cough. EXAM: PORTABLE CHEST 1 VIEW COMPARISON:  August 29, 2020 FINDINGS: The heart size and mediastinal contours are within normal limits. Both lungs are clear. The visualized skeletal structures are unremarkable. IMPRESSION: No active disease. Electronically Signed   By: Dorise Bullion III M.D.   On: 06/06/2021 09:22      Antonieta Pert MD [facility]  If 7PM-7AM, please contact night-coverage [email or web address]  06/06/2021, 12:07 PM

## 2021-06-06 NOTE — ED Triage Notes (Signed)
Pt arrived via [facility] from [facility] for central burning chest pain that radiates into esophagus, non product cough started yesterday. EMS gave nitrox1 and pt vomited after so EMS gave zofran '4mg'$  IV. Pt is A&Ox4, hypoxic. Dr Sherry Ruffing at bedside.

## 2021-06-06 NOTE — Hospital Course (Addendum)
88 y.o. female with history of hypertension hyperlipidemia previous history of GERD, anxiety/depression, previous skin cancer, history of appendectomy hiatal hernia, hypothyroidism coming from skilled nursing facility with burning chest pain radiating into esophagus along with nonproductive cough x2 days along with shortness of breath. EMS was called patient was having chest discomfort when she was having coughing fits.  For EMS she was saturating 90 to 92%, and brought to the ED. On exam in the ED was having rhonchi bilaterally chest was nontender, EKG show sinus rhythm, initially hypoxic 82% on room air placed on nasal cannula, rectal temperature elevated 101 and had tachycardia and was concerned about sepsis/pneumonia chest x-ray no acute finding labs showed lactic acidosis 2.2 D-dimer negative COVID and influenza negative, high sensitive troponin negative x2 LFTs stable.  Blood cultures were sent, cefepime vancomycin Ringer lactate 2.25 liter bolus ordered and admission requested for management.  Antibiotics were further de-escalated to ceftriaxone azithromycin, MRSA negative remained afebrile improved leukocytosis.  Respiratory panel was positive with rhinovirus.  At this time patient remains medically stable to be discharged back to independent living facility.

## 2021-06-06 NOTE — Progress Notes (Signed)
Pharmacy Antibiotic Note  Darlene [name] is a 88 y.o. female admitted on 06/06/2021 with sepsis.  Pharmacy has been consulted for Cefepime and Vancomycin dosing.  72 kg in Aug 2022 - no recent weight available.  Labs pending.    Plan: Cefepime 2g IV now Vancomycin 1250 mg IV now Follow-up labs for further dosing.  Follow-up for accurate weight Follow-up cultures, renal function, and clinical status     No data recorded.  No results for input(s): WBC, CREATININE, LATICACIDVEN, VANCOTROUGH, VANCOPEAK, VANCORANDOM, GENTTROUGH, GENTPEAK, GENTRANDOM, TOBRATROUGH, TOBRAPEAK, TOBRARND, AMIKACINPEAK, AMIKACINTROU, AMIKACIN in the last 168 hours.  CrCl cannot be calculated (Patient's most recent lab result is older than the maximum 21 days allowed.).    Allergies  Allergen Reactions   Norco [Hydrocodone-Acetaminophen] Nausea And Vomiting   Dust Mite Extract Other (See Comments)   Oxycontin [Oxycodone Hcl] Nausea And Vomiting   Pollen Extract Other (See Comments)    Antimicrobials this admission: Cefepime 5/20 >> Vancomycin 5/20 >>  Dose adjustments this admission:   Microbiology results: 5/20 BCx:  5/20 UCx:  5/20 Resp panel:  Thank you for allowing pharmacy to be a part of this patient's care.  Sloan Leiter, PharmD, BCPS, BCCCP Clinical Pharmacist Please refer to [facility] for [place] numbers 06/06/2021 8:10 AM

## 2021-06-06 NOTE — Plan of Care (Signed)
  Problem: Activity: Goal: Ability to tolerate increased activity will improve Outcome: Progressing   

## 2021-06-06 NOTE — ED Notes (Signed)
Placed pt on 5L 02 per [place]

## 2021-06-07 DIAGNOSIS — A419 Sepsis, unspecified organism: Secondary | ICD-10-CM | POA: Diagnosis not present

## 2021-06-07 LAB — COMPREHENSIVE METABOLIC PANEL
ALT: 14 U/L (ref 0–44)
AST: 18 U/L (ref 15–41)
Albumin: 2.9 g/dL — ABNORMAL LOW (ref 3.5–5.0)
Alkaline Phosphatase: 50 U/L (ref 38–126)
Anion gap: 5 (ref 5–15)
BUN: 9 mg/dL (ref 8–23)
CO2: 26 mmol/L (ref 22–32)
Calcium: 8.3 mg/dL — ABNORMAL LOW (ref 8.9–10.3)
Chloride: 107 mmol/L (ref 98–111)
Creatinine, Ser: 0.85 mg/dL (ref 0.44–1.00)
GFR, Estimated: >60 mL/min (ref >60)
Glucose, Bld: 110 mg/dL — ABNORMAL HIGH (ref 70–99)
Potassium: 3.5 mmol/L (ref 3.5–5.1)
Sodium: 138 mmol/L (ref 135–145)
Total Bilirubin: 0.7 mg/dL (ref 0.3–1.2)
Total Protein: 5.3 g/dL — ABNORMAL LOW (ref 6.5–8.1)

## 2021-06-07 LAB — CBC
HCT: 35.5 % — ABNORMAL LOW (ref 36.0–46.0)
Hemoglobin: 11.8 g/dL — ABNORMAL LOW (ref 12.0–15.0)
MCH: 29.9 pg (ref 26.0–34.0)
MCHC: 33.2 g/dL (ref 30.0–36.0)
MCV: 89.9 fL (ref 80.0–100.0)
Platelets: 185 10*3/uL (ref 150–400)
RBC: 3.95 MIL/uL (ref 3.87–5.11)
RDW: 13 % (ref 11.5–15.5)
WBC: 6.5 10*3/uL (ref 4.0–10.5)
nRBC: 0 % (ref 0.0–0.2)

## 2021-06-07 LAB — URINE CULTURE: Culture: NO GROWTH

## 2021-06-07 LAB — LACTIC ACID, PLASMA: Lactic Acid, Venous: 1.4 mmol/L (ref 0.5–1.9)

## 2021-06-07 LAB — HEMOGLOBIN A1C
Hgb A1c MFr Bld: 6.1 % — ABNORMAL HIGH (ref 4.8–5.6)
Mean Plasma Glucose: 128.37 mg/dL

## 2021-06-07 LAB — PROCALCITONIN: Procalcitonin: <0.1 ng/mL

## 2021-06-07 MED ORDER — PREDNISONE 20 MG PO TABS
20.0000 mg | ORAL_TABLET | Freq: Every day | ORAL | Status: AC
Start: 2021-06-07 — End: 2021-06-07
  Administered 2021-06-07: 20 mg via ORAL
  Filled 2021-06-07: qty 1

## 2021-06-07 MED ORDER — PREDNISONE 10 MG PO TABS
10.0000 mg | ORAL_TABLET | Freq: Every day | ORAL | Status: DC
  Administered 2021-06-08: 10 mg via ORAL
  Filled 2021-06-07 (×2): qty 1

## 2021-06-07 MED ORDER — SODIUM CHLORIDE 0.9 % IV SOLN
500.0000 mg | INTRAVENOUS | Status: DC
  Administered 2021-06-07 – 2021-06-08 (×2): 500 mg via INTRAVENOUS
  Filled 2021-06-07 (×3): qty 5

## 2021-06-07 MED ORDER — SODIUM CHLORIDE 0.9 % IV SOLN
1.0000 g | INTRAVENOUS | Status: DC
  Administered 2021-06-07: 1 g via INTRAVENOUS
  Filled 2021-06-07 (×2): qty 10

## 2021-06-07 NOTE — Progress Notes (Signed)
PROGRESS NOTE Darlene [name]  WCB:[id number] DOB: July 09, 1933 DOA: 06/06/2021 PCP: Irving Copas., MD   Brief Narrative/Hospital Course: 88 y.o. female with history of hypertension hyperlipidemia previous history of GERD, anxiety/depression, previous skin cancer, history of appendectomy hiatal hernia, hypothyroidism coming from skilled nursing facility with burning chest pain radiating into esophagus along with nonproductive cough x2 days along with shortness of breath. EMS was called patient was having chest discomfort when she was having coughing fits.  For EMS she was saturating 90 to 92%, and brought to the ED. On exam in the ED was having rhonchi bilaterally chest was nontender, EKG show sinus rhythm, initially hypoxic 82% on room air placed on nasal cannula, rectal temperature elevated 101 and had tachycardia and was concerned about sepsis/pneumonia chest x-ray no acute finding labs showed lactic acidosis 2.2 D-dimer negative COVID and influenza negative, high sensitive troponin negative x2 LFTs stable.  Blood cultures were sent, cefepime vancomycin Ringer lactate 2.25 liter bolus ordered and admission requested for management.    Subjective: Seen and examined this morning. Still having sore throat, congestion cough but ongoing for few years. She would like to let Dr. Rush Landmark know that she is in the hospital  Assessment and Plan: Principal Problem:   Sepsis ([place]) Active Problems:   Essential hypertension   History of CVA (cerebrovascular accident)   Hyperlipidemia   GAD (generalized anxiety disorder)   Acquired hypothyroidism   Chronic sinusitis   Gastroesophageal reflux disease   Chronic sore throat   Chronic cough   Sepsis POA Cough with chest pain Acute hypoxic respiratory failure: Sepsis POA source suspecting pneumonia in the setting of patient's chronic sinus disease, also has chronic sore throat/chronic cough.  D-dimer negative, on 2 L nasal cannula wean as  tolerated.  Afebrile since admission leukocytosis resolved.  Change antibiotics to ceftriaxone/azithromycin.  MRSA screen was negative.  RVP panel positive for rhinovirus.  We will add low-dose prednisone short course to help with her inflammation. f/u Legionella and Streptococcus, sputum culture. Cont antitussives, Claritin Flonase. Repeat lactate normalized. Recent Labs  Lab 06/06/21 0800 06/06/21 1001 06/07/21 0408 06/07/21 0926  WBC 11.1*  --  6.5  --   LATICACIDVEN 2.2* 2.1*  --  1.4  PROCALCITON <0.10  --  <0.10  --     Essential hypertension: BP well controlled holding losartan.   History of CVA: HLD- Stable continue home Plavix, pravastatin.   GAD: cont valium. Acquired hypothyroidism: cont Synthroid. GERD-followed by Dr. Rush Landmark,  cont PPI follow-up   Chronic sinusitis Chronic sore throat Chronic cough: Cont Flonase and Claritin, antitussives  DVT prophylaxis: enoxaparin (LOVENOX) injection 40 mg Start: 06/06/21 1200 Code Status:   Code Status: Full Code Family Communication: plan of care discussed with patient/ at bedside. Patient status is: Inpatient because of ongoing management of hypoxia, sepsis Level of care: Telemetry Medical   Dispo: The patient is from: Independent living facility            Anticipated disposition: Back to facility likely tomorrow  Mobility Assessment (last 72 hours)     Mobility Assessment     Row Name 06/06/21 1941           Does patient have [name] order for bedrest or is patient medically unstable No - Continue assessment       What is the highest level of mobility based on the progressive mobility assessment? Level 5 (Walks with assist in room/hall) - Balance while stepping forward/back and can walk in room with assist -  Complete                 Objective: Vitals last 24 hrs: Vitals:   06/06/21 1952 06/06/21 2332 06/07/21 0341 06/07/21 0748  BP: (!) 129/53 (!) 113/53 (!) 116/52 (!) 108/49  Pulse: 84 69 67 60  Resp: '18  20 18 20  '$ Temp: 98.4 F (36.9 C) 99.9 F (37.7 C) 99.3 F (37.4 C) 98.6 F (37 C)  TempSrc: Oral Oral Oral Oral  SpO2: 98% 97% 95% 95%  Weight:      Height:       Weight change:   Physical Examination: General exam: alert awake,older than stated age, weak appearing. HEENT:Oral mucosa moist, Ear/Nose WNL grossly, dentition normal. Respiratory system: bilaterally diminished with mild wheezing BS, no use of accessory muscle Cardiovascular system: S1 & S2 +, No JVD. Gastrointestinal system: Abdomen soft,NT,ND, BS+ Nervous System:Alert, awake, moving extremities and grossly nonfocal Extremities: LE edema ,distal peripheral pulses palpable.  Skin: No rashes,no icterus. MSK: Normal muscle bulk,tone, power  Medications reviewed:  Scheduled Meds:  azelastine  1 spray Each Nare BID   clopidogrel  75 mg Oral Daily   enoxaparin (LOVENOX) injection  40 mg Subcutaneous Q24H   fluticasone  2 spray Each Nare Daily   guaiFENesin  600 mg Oral BID   levothyroxine  100 mcg Oral Q0600   loratadine  10 mg Oral Daily   mirabegron ER  25 mg Oral Daily   naphazoline-glycerin  1 drop Both Eyes BID   pantoprazole  40 mg Oral Daily   pravastatin  40 mg Oral q1800   [START ON 06/08/2021] predniSONE  10 mg Oral Q breakfast   Continuous Infusions:  azithromycin     cefTRIAXone (ROCEPHIN)  IV        Diet Order             DIET SOFT Room service appropriate? Yes; Fluid consistency: Thin  Diet effective now                            Intake/Output Summary (Last 24 hours) at 06/07/2021 1042 Last data filed at 06/07/2021 0321 Gross per 24 hour  Intake 3599.78 ml  Output --  Net 3599.78 ml   Net IO Since Admission: 3,700.16 mL [06/07/21 1042]  Wt Readings from Last 3 Encounters:  06/06/21 70.8 kg  05/14/21 70.8 kg  05/08/21 70.8 kg     Unresulted Labs (From admission, onward)     Start     Ordered   06/13/21 0500  Creatinine, serum  (enoxaparin (LOVENOX)    CrCl >/= 30 ml/min)   Weekly,   R     Comments: while on enoxaparin therapy    06/06/21 1159   06/07/21 0500  Procalcitonin  Daily,   R      06/06/21 1208   06/06/21 1310  Legionella Pneumophila Serogp 1 Ur Ag  (COPD / Pneumonia / Cellulitis / Lower Extremity Wound)  Add-on,   AD        06/06/21 1309          Data Reviewed: I have personally reviewed following labs and imaging studies CBC: Recent Labs  Lab 06/06/21 0800 06/07/21 0408  WBC 11.1* 6.5  NEUTROABS 9.6*  --   HGB 13.9 11.8*  HCT 42.7 35.5*  MCV 92.0 89.9  PLT 229 476   Basic Metabolic Panel: Recent Labs  Lab 06/06/21 0800 06/07/21 0408  NA 142 138  K 4.0 3.5  CL 109 107  CO2 24 26  GLUCOSE 161* 110*  BUN 9 9  CREATININE 0.87 0.85  CALCIUM 9.3 8.3*   GFR: Estimated Creatinine Clearance: 44.6 mL/min (by C-G formula based on SCr of 0.85 mg/dL). Liver Function Tests: Recent Labs  Lab 06/06/21 0800 06/07/21 0408  AST 20 18  ALT 15 14  ALKPHOS 64 50  BILITOT 0.8 0.7  PROT 6.5 5.3*  ALBUMIN 3.8 2.9*   Recent Labs  Lab 06/06/21 0800  LIPASE 33   No results for input(s): AMMONIA in the last 168 hours. Coagulation Profile: Recent Labs  Lab 06/06/21 0800  INR 1.0   BNP (last 3 results) No results for input(s): PROBNP in the last 8760 hours. HbA1C: Recent Labs    06/07/21 0408  HGBA1C 6.1*   CBG: No results for input(s): GLUCAP in the last 168 hours. Lipid Profile: No results for input(s): CHOL, HDL, LDLCALC, TRIG, CHOLHDL, LDLDIRECT in the last 72 hours. Thyroid Function Tests: No results for input(s): TSH, T4TOTAL, FREET4, T3FREE, THYROIDAB in the last 72 hours. Sepsis Labs: Recent Labs  Lab 06/06/21 0800 06/06/21 1001 06/07/21 0408 06/07/21 0926  PROCALCITON <0.10  --  <0.10  --   LATICACIDVEN 2.2* 2.1*  --  1.4    Recent Results (from the past 240 hour(s))  Blood culture (routine x 2)     Status: None (Preliminary result)   Collection Time: 06/06/21  8:01 AM   Specimen: BLOOD  Result Value  Ref Range Status   Specimen Description BLOOD LEFT ANTECUBITAL  Final   Special Requests   Final    BOTTLES DRAWN AEROBIC AND ANAEROBIC Blood Culture adequate volume   Culture   Final    NO GROWTH < 24 HOURS Performed at [facility], [place] [address] [place], [place] [address]    Report Status PENDING  Incomplete  Resp Panel by RT-PCR (Flu A&B, Covid) Nasopharyngeal Swab     Status: None   Collection Time: 06/06/21  8:01 AM   Specimen: Nasopharyngeal Swab; Nasopharyngeal(NP) swabs in vial transport medium  Result Value Ref Range Status   SARS Coronavirus 2 by RT PCR NEGATIVE NEGATIVE Final    Comment: (NOTE) SARS-CoV-2 target nucleic acids are NOT DETECTED.  The SARS-CoV-2 RNA is generally detectable in upper respiratory specimens during the acute phase of infection. The lowest concentration of SARS-CoV-2 viral copies this assay can detect is 138 copies/mL. A negative result does not preclude SARS-Cov-2 infection and should not be used as the sole basis for treatment or other patient management decisions. A negative result may occur with  improper specimen collection/handling, submission of specimen other than nasopharyngeal swab, presence of viral mutation(s) within the areas targeted by this assay, and inadequate number of viral copies(<138 copies/mL). A negative result must be combined with clinical observations, patient history, and epidemiological information. The expected result is Negative.  Fact Sheet for Patients:  EntrepreneurPulse.com.au  Fact Sheet for Healthcare Providers:  IncredibleEmployment.be  This test is no t yet approved or cleared by the [place] FDA and  has been authorized for detection and/or diagnosis of SARS-CoV-2 by FDA under [name] Emergency Use Authorization (EUA). This EUA will remain  in effect (meaning this test can be used) for the duration of the COVID-19 declaration under Section 564(b)(1) of the  Act, 21 U.S.C.section 360bbb-3(b)(1), unless the authorization is terminated  or revoked sooner.       Influenza A by PCR NEGATIVE NEGATIVE Final  Influenza B by PCR NEGATIVE NEGATIVE Final    Comment: (NOTE) The Xpert Xpress SARS-CoV-2/FLU/RSV plus assay is intended as [name] aid in the diagnosis of influenza from Nasopharyngeal swab specimens and should not be used as a sole basis for treatment. Nasal washings and aspirates are unacceptable for Xpert Xpress SARS-CoV-2/FLU/RSV testing.  Fact Sheet for Patients: EntrepreneurPulse.com.au  Fact Sheet for Healthcare Providers: IncredibleEmployment.be  This test is not yet approved or cleared by the [place] FDA and has been authorized for detection and/or diagnosis of SARS-CoV-2 by FDA under [name] Emergency Use Authorization (EUA). This EUA will remain in effect (meaning this test can be used) for the duration of the COVID-19 declaration under Section 564(b)(1) of the Act, 21 U.S.C. section 360bbb-3(b)(1), unless the authorization is terminated or revoked.  Performed at [facility], [place] [address], [place], [place] [address]   Blood culture (routine x 2)     Status: None (Preliminary result)   Collection Time: 06/06/21  8:06 AM   Specimen: BLOOD  Result Value Ref Range Status   Specimen Description BLOOD RIGHT ANTECUBITAL  Final   Special Requests   Final    BOTTLES DRAWN AEROBIC AND ANAEROBIC Blood Culture adequate volume   Culture   Final    NO GROWTH < 24 HOURS Performed at [facility], [place] [address], [place], [place] [address]    Report Status PENDING  Incomplete  Urine Culture     Status: None   Collection Time: 06/06/21  8:08 AM   Specimen: In/Out Cath Urine  Result Value Ref Range Status   Specimen Description IN/OUT CATH URINE  Final   Special Requests NONE  Final   Culture   Final    NO GROWTH Performed at [facility], [place] [address], [place],  [place] [address]    Report Status 06/07/2021 FINAL  Final  Respiratory (~20 pathogens) panel by PCR     Status: Abnormal   Collection Time: 06/06/21  9:00 AM   Specimen: Nasopharyngeal Swab; Respiratory  Result Value Ref Range Status   Adenovirus NOT DETECTED NOT DETECTED Final   Coronavirus 229E NOT DETECTED NOT DETECTED Final    Comment: (NOTE) The Coronavirus on the Respiratory Panel, DOES NOT test for the novel  Coronavirus (2019 nCoV)    Coronavirus HKU1 NOT DETECTED NOT DETECTED Final   Coronavirus NL63 NOT DETECTED NOT DETECTED Final   Coronavirus OC43 NOT DETECTED NOT DETECTED Final   Metapneumovirus NOT DETECTED NOT DETECTED Final   Rhinovirus / Enterovirus DETECTED (A) NOT DETECTED Final   Influenza A NOT DETECTED NOT DETECTED Final   Influenza B NOT DETECTED NOT DETECTED Final   Parainfluenza Virus 1 NOT DETECTED NOT DETECTED Final   Parainfluenza Virus 2 NOT DETECTED NOT DETECTED Final   Parainfluenza Virus 3 NOT DETECTED NOT DETECTED Final   Parainfluenza Virus 4 NOT DETECTED NOT DETECTED Final   Respiratory Syncytial Virus NOT DETECTED NOT DETECTED Final   Bordetella pertussis NOT DETECTED NOT DETECTED Final   Bordetella Parapertussis NOT DETECTED NOT DETECTED Final   Chlamydophila pneumoniae NOT DETECTED NOT DETECTED Final   Mycoplasma pneumoniae NOT DETECTED NOT DETECTED Final    Comment: Performed at [facility], [place]. [address], [place], [place] [address]  Expectorated Sputum Assessment w Gram Stain, Rflx to Resp Cult     Status: None   Collection Time: 06/06/21 12:00 PM   Specimen: Sputum  Result Value Ref Range Status   Specimen Description  SPUTUM  Final   Special Requests NONE  Final   Sputum evaluation   Final    THIS SPECIMEN IS ACCEPTABLE FOR SPUTUM CULTURE Performed at [facility], [address] [place], [place] [address]    Report Status 06/06/2021 FINAL  Final  MRSA Next Gen by PCR, Nasal     Status: None   Collection Time: 06/06/21 12:00 PM    Specimen: Nasal Mucosa; Nasal Swab  Result Value Ref Range Status   MRSA by PCR Next Gen NOT DETECTED NOT DETECTED Final    Comment: (NOTE) The GeneXpert MRSA Assay (FDA approved for NASAL specimens only), is one component of a comprehensive MRSA colonization surveillance program. It is not intended to diagnose MRSA infection nor to guide or monitor treatment for MRSA infections. Test performance is not FDA approved in patients less than 63 years old. Performed at [facility], [place] [address], [place], [place] [address]   Culture, Respiratory w Gram Stain     Status: None (Preliminary result)   Collection Time: 06/06/21 12:00 PM   Specimen: SPU  Result Value Ref Range Status   Specimen Description SPUTUM  Final   Special Requests NONE Reflexed from [id number]  Final   Gram Stain   Final    ABUNDANT WBC PRESENT, PREDOMINANTLY PMN RARE GRAM POSITIVE COCCI IN PAIRS RARE GRAM POSITIVE RODS    Culture   Final    CULTURE REINCUBATED FOR BETTER GROWTH Performed at [facility], [place] [address], [place], [place] [address]    Report Status PENDING  Incomplete    Antimicrobials: Anti-infectives (From admission, onward)    Start     Dose/Rate Route Frequency Ordered Stop   06/07/21 1800  cefTRIAXone (ROCEPHIN) 1 g in sodium chloride 0.9 % 100 mL IVPB        1 g 200 mL/hr over 30 Minutes Intravenous Every 24 hours 06/07/21 0816 06/11/21 1759   06/07/21 1000  vancomycin (VANCOCIN) IVPB 1000 mg/200 mL premix  Status:  Discontinued        1,000 mg 200 mL/hr over 60 Minutes Intravenous Every 24 hours 06/06/21 1030 06/07/21 0816   06/07/21 1000  azithromycin (ZITHROMAX) 500 mg in sodium chloride 0.9 % 250 mL IVPB        500 mg 250 mL/hr over 60 Minutes Intravenous Every 24 hours 06/07/21 0816 06/10/21 0959   06/06/21 1600  ceFEPIme (MAXIPIME) 2 g in sodium chloride 0.9 % 100 mL IVPB  Status:  Discontinued        2 g 200 mL/hr over 30 Minutes Intravenous Every 12 hours 06/06/21  1030 06/07/21 0816   06/06/21 0830  vancomycin (VANCOREADY) IVPB 1250 mg/250 mL        1,250 mg 166.7 mL/hr over 90 Minutes Intravenous STAT 06/06/21 0822 06/06/21 1055   06/06/21 0815  ceFEPIme (MAXIPIME) 2 g in sodium chloride 0.9 % 100 mL IVPB        2 g 200 mL/hr over 30 Minutes Intravenous  Once 06/06/21 0808 06/06/21 0911   06/06/21 0815  metroNIDAZOLE (FLAGYL) IVPB 500 mg        500 mg 100 mL/hr over 60 Minutes Intravenous  Once 06/06/21 0808 06/06/21 0939   06/06/21 0815  vancomycin (VANCOCIN) IVPB 1000 mg/200 mL premix  Status:  Discontinued        1,000 mg 200 mL/hr over 60 Minutes Intravenous  Once 06/06/21 0808 06/06/21 9678      Culture/Microbiology  Component Value Date/Time   SDES SPUTUM 06/06/2021 1200   SDES SPUTUM 06/06/2021 1200   SPECREQUEST NONE 06/06/2021 1200   SPECREQUEST NONE Reflexed from [id number] 06/06/2021 1200   CULT  06/06/2021 1200    CULTURE REINCUBATED FOR BETTER GROWTH Performed at [facility] [address], [place], [place] [address]    REPTSTATUS 06/06/2021 FINAL 06/06/2021 1200   REPTSTATUS PENDING 06/06/2021 1200    Other culture-see note Radiology Studies: DG Chest Portable 1 View  Result Date: 06/06/2021 CLINICAL DATA:  Chest pain and shortness of breath.  Cough. EXAM: PORTABLE CHEST 1 VIEW COMPARISON:  August 29, 2020 FINDINGS: The heart size and mediastinal contours are within normal limits. Both lungs are clear. The visualized skeletal structures are unremarkable. IMPRESSION: No active disease. Electronically Signed   By: Dorise Bullion III M.D.   On: 06/06/2021 09:22     LOS: 1 day   Antonieta Pert, MD [facility]  06/07/2021, 10:42 AM

## 2021-06-08 ENCOUNTER — Telehealth: Payer: Self-pay | Admitting: Physician Assistant

## 2021-06-08 DIAGNOSIS — A419 Sepsis, unspecified organism: Secondary | ICD-10-CM | POA: Diagnosis not present

## 2021-06-08 MED ORDER — LORATADINE 10 MG PO TABS: 10.0000 mg | ORAL_TABLET | Freq: Every day | ORAL | 0 refills | Status: DC | PRN

## 2021-06-08 MED ORDER — CEPACOL 15-2.3 MG MT LOZG: 1.0000 | LOZENGE | Freq: Three times a day (TID) | OROMUCOSAL | 0 refills | Status: DC | PRN

## 2021-06-08 MED ORDER — ALBUTEROL SULFATE HFA 108 (90 BASE) MCG/ACT IN AERS: 2.0000 | INHALATION_SPRAY | Freq: Four times a day (QID) | RESPIRATORY_TRACT | 0 refills | Status: DC | PRN

## 2021-06-08 MED ORDER — AMOXICILLIN-POT CLAVULANATE 875-125 MG PO TABS
1.0000 | ORAL_TABLET | Freq: Two times a day (BID) | ORAL | 0 refills | Status: AC
Start: 2021-06-08 — End: 2021-06-11

## 2021-06-08 MED ORDER — PREDNISONE 10 MG PO TABS
10.0000 mg | ORAL_TABLET | Freq: Every day | ORAL | 0 refills | Status: AC
Start: 2021-06-08 — End: 2021-06-12

## 2021-06-08 NOTE — Discharge Summary (Addendum)
Physician Discharge Summary  Darlene [name] LOV:[id number] DOB: 1934/01/06 DOA: 06/06/2021  PCP: Irving Copas., MD  Admit date: 06/06/2021 Discharge date: 06/08/2021 Recommendations for Outpatient Follow-up:  Follow up with PCP in 1 weeks-call for appointment Please obtain BMP/CBC in one week Fu with your GI  Discharge Dispo: independent living facility Discharge Condition: Stable Code Status:   Code Status: Full Code Diet recommendation:  Diet Order             DIET SOFT Room service appropriate? Yes; Fluid consistency: Thin  Diet effective now                    Brief/Interim Summary: 88 y.o. female with history of hypertension hyperlipidemia previous history of GERD, anxiety/depression, previous skin cancer, history of appendectomy hiatal hernia, hypothyroidism coming from skilled nursing facility with burning chest pain radiating into esophagus along with nonproductive cough x2 days along with shortness of breath. EMS was called patient was having chest discomfort when she was having coughing fits.  For EMS she was saturating 90 to 92%, and brought to the ED. On exam in the ED was having rhonchi bilaterally chest was nontender, EKG show sinus rhythm, initially hypoxic 82% on room air placed on nasal cannula, rectal temperature elevated 101 and had tachycardia and was concerned about sepsis/pneumonia chest x-ray no acute finding labs showed lactic acidosis 2.2 D-dimer negative COVID and influenza negative, high sensitive troponin negative x2 LFTs stable.  Blood cultures were sent, cefepime vancomycin Ringer lactate 2.25 liter bolus ordered and admission requested for management.  Antibiotics were further de-escalated to ceftriaxone azithromycin, MRSA negative remained afebrile improved leukocytosis.  Respiratory panel was positive with rhinovirus.  At this time patient remains medically stable to be discharged back to independent living facility.   Discharge Diagnoses:   Principal Problem:   Sepsis ([place]) Active Problems:   Essential hypertension   History of CVA (cerebrovascular accident)   Hyperlipidemia   GAD (generalized anxiety disorder)   Acquired hypothyroidism   Chronic sinusitis   Gastroesophageal reflux disease   Chronic sore throat   Chronic cough  Sepsis POA Cough with chest pain Acute hypoxic respiratory failure-resolved RVP panel positive for rhinovirus: Sepsis POA source suspecting pneumonia in the setting of patient's chronic sinus disease, also has chronic sore throat/chronic cough.  D-dimer negative, on 2 L nasal cannula and on on RA.Afebrile since admission leukocytosis resolved.  We will switch to oral Augmentin and discharge her on that.  So far work-up unremarkable except fir RVP panel positive for rhinovirus, patient feeling much improved after prednisone continue low-dose for next 4 days, loratadine, along with her nasal decongestant, can use prn Cepacol, proair inhalers. Recent Labs  Lab 06/06/21 0800 06/06/21 1001 06/07/21 0408 06/07/21 0926  WBC 11.1*  --  6.5  --   LATICACIDVEN 2.2* 2.1*  --  1.4  PROCALCITON <0.10  --  <0.10  --       Essential hypertension: BP well controlled RESUME losartan.   History of CVA: HLD- Stable continue home Plavix, pravastatin.   GAD: cont valium. Acquired hypothyroidism: cont Synthroid. GERD-followed by Dr. Rush Landmark,  cont PPI.   Chronic sinusitis Chronic sore throat Chronic cough: Cont Flonase and Claritin, antitussives   Consults: none Subjective: Aaox3 feels much better, some hoarseness of the voice.   Feels ready for discharge today.  Discharge Exam: Vitals:   06/08/21 0358 06/08/21 0733  BP: 140/60 (!) 141/69  Pulse: (!) 47 60  Resp: 16 18  Temp:  97.8 F (36.6 C) 97.8 F (36.6 C)  SpO2: 97% 98%   General: Pt is alert, awake, not in acute distress Cardiovascular: RRR, S1/S2 +, no rubs, no gallops Respiratory: CTA bilaterally, no wheezing, no  rhonchi Abdominal: Soft, NT, ND, bowel sounds + Extremities: no edema, no cyanosis  Discharge Instructions  Discharge Instructions     Discharge instructions   Complete by: As directed    Please call call MD or return to ER for similar or worsening recurring problem that brought you to hospital or if any fever,nausea/vomiting,abdominal pain, uncontrolled pain, chest pain,  shortness of breath or any other alarming symptoms.  Please follow-up your doctor as instructed in a week time and call the office for appointment.  Please avoid alcohol, smoking, or any other illicit substance and maintain healthy habits including taking your regular medications as prescribed.  You were cared for by a hospitalist during your hospital stay. If you have any questions about your discharge medications or the care you received while you were in the hospital after you are discharged, you can call the unit and ask to speak with the hospitalist on call if the hospitalist that took care of you is not available.  Once you are discharged, your primary care physician will handle any further medical issues. Please note that NO REFILLS for any discharge medications will be authorized once you are discharged, as it is imperative that you return to your primary care physician (or establish a relationship with a primary care physician if you do not have one) for your aftercare needs so that they can reassess your need for medications and monitor your lab values   Increase activity slowly   Complete by: As directed       Allergies as of 06/08/2021       Reactions   Lactose Intolerance (gi) Other (See Comments)   indigestion   Norco [hydrocodone-acetaminophen] Nausea And Vomiting   Dust Mite Extract Other (See Comments)   Sinus drainage   Oxycontin [oxycodone Hcl] Nausea And Vomiting   Pollen Extract Other (See Comments)   Sinus drainage        Medication List     TAKE these medications    acetaminophen  325 MG tablet Commonly known as: TYLENOL Take 650 mg by mouth every 6 (six) hours as needed for headache (pain).   albuterol 108 (90 Base) MCG/ACT inhaler Commonly known as: VENTOLIN HFA Inhale 2 puffs into the lungs every 6 (six) hours as needed for wheezing or shortness of breath.   amoxicillin-clavulanate 875-125 MG tablet Commonly known as: AUGMENTIN Take 1 tablet by mouth 2 (two) times daily for 3 days.   azelastine 0.1 % nasal spray Commonly known as: ASTELIN Place 1 to 2 sprays each nostril 1-2 times a day as needed for runny nose/drainage down throat What changed:  how much to take how to take this when to take this additional instructions   Cepacol 15-2.3 MG Lozg Generic drug: Benzocaine-Menthol Use as directed 1 lozenge in the mouth or throat 3 (three) times daily as needed.   clopidogrel 75 MG tablet Commonly known as: PLAVIX TAKE 1 TABLET BY MOUTH EVERY DAY What changed: when to take this   diazepam 5 MG tablet Commonly known as: VALIUM Take 0.5 tablets (2.5 mg total) by mouth as needed (anxiety). WILL NEED APPOINTMENT FOR ADDITIONAL REFILLS What changed:  when to take this reasons to take this   FDgard 25-20.75 MG Caps Generic drug: [place] Oil-Levomenthol Take one  capsule by mouth twice daily. What changed:  how much to take how to take this when to take this additional instructions   fluticasone 50 MCG/ACT nasal spray Commonly known as: FLONASE Place 1-2 sprays into both nostrils daily. What changed:  how much to take when to take this   Gemtesa 75 MG Tabs Generic drug: Vibegron Take 75 mg by mouth at bedtime.   ketoconazole 2 % shampoo Commonly known as: NIZORAL APPLY 1 APPLICATION TOPICALLY TWICE WEEKLY AS NEEDED FOR SCALP IRRITATION. What changed: See the new instructions.   loratadine 10 MG tablet Commonly known as: CLARITIN Take 1 tablet (10 mg total) by mouth daily as needed for up to 15 days for allergies.   losartan 50 MG  tablet Commonly known as: COZAAR Take 50 mg by mouth at bedtime.   nitroGLYCERIN 0.4 MG SL tablet Commonly known as: NITROSTAT Place 1 tablet (0.4 mg total) under the tongue every 5 (five) minutes as needed for up to 25 days for chest pain.   omeprazole 40 MG capsule Commonly known as: PRILOSEC Take 40 mg by mouth daily with lunch.   pravastatin 40 MG tablet Commonly known as: PRAVACHOL TAKE 1 TABLET BY MOUTH EVERYDAY AT BEDTIME What changed: See the new instructions.   predniSONE 10 MG tablet Commonly known as: DELTASONE Take 1 tablet (10 mg total) by mouth daily with breakfast for 4 days.   Synthroid 100 MCG tablet Generic drug: levothyroxine TAKE 1 TABLET BY MOUTH EVERY DAY BEFORE BREAKFAST What changed: See the new instructions.   Systane 0.4-0.3 % Soln Generic drug: Polyethyl Glycol-Propyl Glycol Place 1 drop into both eyes every morning.   Systane 0.4-0.3 % Gel ophthalmic gel Generic drug: Polyethyl Glycol-Propyl Glycol Place 1 application. into both eyes at bedtime.   Zolpidem Tartrate 3.5 MG Subl Take 1 tablet by mouth at bedtime as needed. What changed:  how much to take when to take this additional instructions        Allergies  Allergen Reactions   Lactose Intolerance (Gi) Other (See Comments)    indigestion   Norco [Hydrocodone-Acetaminophen] Nausea And Vomiting   Dust Mite Extract Other (See Comments)    Sinus drainage   Oxycontin [Oxycodone Hcl] Nausea And Vomiting   Pollen Extract Other (See Comments)    Sinus drainage    The results of significant diagnostics from this hospitalization (including imaging, microbiology, ancillary and laboratory) are listed below for reference.    Microbiology: Recent Results (from the past 240 hour(s))  Blood culture (routine x 2)     Status: None (Preliminary result)   Collection Time: 06/06/21  8:01 AM   Specimen: BLOOD  Result Value Ref Range Status   Specimen Description BLOOD LEFT ANTECUBITAL  Final    Special Requests   Final    BOTTLES DRAWN AEROBIC AND ANAEROBIC Blood Culture adequate volume   Culture   Final    NO GROWTH 2 DAYS Performed at [facility], [address] [place], [place] [address]    Report Status PENDING  Incomplete  Resp Panel by RT-PCR (Flu A&B, Covid) Nasopharyngeal Swab     Status: None   Collection Time: 06/06/21  8:01 AM   Specimen: Nasopharyngeal Swab; Nasopharyngeal(NP) swabs in vial transport medium  Result Value Ref Range Status   SARS Coronavirus 2 by RT PCR NEGATIVE NEGATIVE Final    Comment: (NOTE) SARS-CoV-2 target nucleic acids are NOT DETECTED.  The SARS-CoV-2 RNA is generally detectable in upper respiratory specimens during  the acute phase of infection. The lowest concentration of SARS-CoV-2 viral copies this assay can detect is 138 copies/mL. A negative result does not preclude SARS-Cov-2 infection and should not be used as the sole basis for treatment or other patient management decisions. A negative result may occur with  improper specimen collection/handling, submission of specimen other than nasopharyngeal swab, presence of viral mutation(s) within the areas targeted by this assay, and inadequate number of viral copies(<138 copies/mL). A negative result must be combined with clinical observations, patient history, and epidemiological information. The expected result is Negative.  Fact Sheet for Patients:  EntrepreneurPulse.com.au  Fact Sheet for Healthcare Providers:  IncredibleEmployment.be  This test is no t yet approved or cleared by the [place] FDA and  has been authorized for detection and/or diagnosis of SARS-CoV-2 by FDA under an Emergency Use Authorization (EUA). This EUA will remain  in effect (meaning this test can be used) for the duration of the COVID-19 declaration under Section 564(b)(1) of the Act, 21 U.S.C.section 360bbb-3(b)(1), unless the authorization is terminated   or revoked sooner.       Influenza A by PCR NEGATIVE NEGATIVE Final   Influenza B by PCR NEGATIVE NEGATIVE Final    Comment: (NOTE) The Xpert Xpress SARS-CoV-2/FLU/RSV plus assay is intended as an aid in the diagnosis of influenza from Nasopharyngeal swab specimens and should not be used as a sole basis for treatment. Nasal washings and aspirates are unacceptable for Xpert Xpress SARS-CoV-2/FLU/RSV testing.  Fact Sheet for Patients: EntrepreneurPulse.com.au  Fact Sheet for Healthcare Providers: IncredibleEmployment.be  This test is not yet approved or cleared by the [place] FDA and has been authorized for detection and/or diagnosis of SARS-CoV-2 by FDA under an Emergency Use Authorization (EUA). This EUA will remain in effect (meaning this test can be used) for the duration of the COVID-19 declaration under Section 564(b)(1) of the Act, 21 U.S.C. section 360bbb-3(b)(1), unless the authorization is terminated or revoked.  Performed at [facility], [place] [address], [place], [place] [address]   Blood culture (routine x 2)     Status: None (Preliminary result)   Collection Time: 06/06/21  8:06 AM   Specimen: BLOOD  Result Value Ref Range Status   Specimen Description BLOOD RIGHT ANTECUBITAL  Final   Special Requests   Final    BOTTLES DRAWN AEROBIC AND ANAEROBIC Blood Culture adequate volume   Culture   Final    NO GROWTH 2 DAYS Performed at [facility], [place] [address] [place], [place] [address]    Report Status PENDING  Incomplete  Urine Culture     Status: None   Collection Time: 06/06/21  8:08 AM   Specimen: In/Out Cath Urine  Result Value Ref Range Status   Specimen Description IN/OUT CATH URINE  Final   Special Requests NONE  Final   Culture   Final    NO GROWTH Performed at [facility], [place] [address], [place], [place] [address]    Report Status 06/07/2021 FINAL  Final  Respiratory (~20 pathogens)  panel by PCR     Status: Abnormal   Collection Time: 06/06/21  9:00 AM   Specimen: Nasopharyngeal Swab; Respiratory  Result Value Ref Range Status   Adenovirus NOT DETECTED NOT DETECTED Final   Coronavirus 229E NOT DETECTED NOT DETECTED Final    Comment: (NOTE) The Coronavirus on the Respiratory Panel, DOES NOT test for the novel  Coronavirus (2019 nCoV)    Coronavirus HKU1 NOT  DETECTED NOT DETECTED Final   Coronavirus NL63 NOT DETECTED NOT DETECTED Final   Coronavirus OC43 NOT DETECTED NOT DETECTED Final   Metapneumovirus NOT DETECTED NOT DETECTED Final   Rhinovirus / Enterovirus DETECTED (A) NOT DETECTED Final   Influenza A NOT DETECTED NOT DETECTED Final   Influenza B NOT DETECTED NOT DETECTED Final   Parainfluenza Virus 1 NOT DETECTED NOT DETECTED Final   Parainfluenza Virus 2 NOT DETECTED NOT DETECTED Final   Parainfluenza Virus 3 NOT DETECTED NOT DETECTED Final   Parainfluenza Virus 4 NOT DETECTED NOT DETECTED Final   Respiratory Syncytial Virus NOT DETECTED NOT DETECTED Final   Bordetella pertussis NOT DETECTED NOT DETECTED Final   Bordetella Parapertussis NOT DETECTED NOT DETECTED Final   Chlamydophila pneumoniae NOT DETECTED NOT DETECTED Final   Mycoplasma pneumoniae NOT DETECTED NOT DETECTED Final    Comment: Performed at [facility], [place] [address], [place], [place] [address]  Expectorated Sputum Assessment w Gram Stain, Rflx to Resp Cult     Status: None   Collection Time: 06/06/21 12:00 PM   Specimen: Sputum  Result Value Ref Range Status   Specimen Description SPUTUM  Final   Special Requests NONE  Final   Sputum evaluation   Final    THIS SPECIMEN IS ACCEPTABLE FOR SPUTUM CULTURE Performed at [facility], [place] [address], [place], [place] [address]    Report Status 06/06/2021 FINAL  Final  MRSA Next Gen by PCR, Nasal     Status: None   Collection Time: 06/06/21 12:00 PM   Specimen: Nasal Mucosa; Nasal Swab  Result Value Ref Range Status   MRSA by  PCR Next Gen NOT DETECTED NOT DETECTED Final    Comment: (NOTE) The GeneXpert MRSA Assay (FDA approved for NASAL specimens only), is one component of a comprehensive MRSA colonization surveillance program. It is not intended to diagnose MRSA infection nor to guide or monitor treatment for MRSA infections. Test performance is not FDA approved in patients less than 30 years old. Performed at [facility], [place] [address] [place], [place] [address]   Culture, Respiratory w Gram Stain     Status: None (Preliminary result)   Collection Time: 06/06/21 12:00 PM   Specimen: SPU  Result Value Ref Range Status   Specimen Description SPUTUM  Final   Special Requests NONE Reflexed from [id number]  Final   Gram Stain   Final    ABUNDANT WBC PRESENT, PREDOMINANTLY PMN RARE GRAM POSITIVE COCCI IN PAIRS RARE GRAM POSITIVE RODS    Culture   Final    Normal respiratory flora-no Staph aureus or Pseudomonas seen Performed at [facility], [place] [address], [place],  [address]    Report Status PENDING  Incomplete    Procedures/Studies: DG Chest Portable 1 View  Result Date: 06/06/2021 CLINICAL DATA:  Chest pain and shortness of breath.  Cough. EXAM: PORTABLE CHEST 1 VIEW COMPARISON:  August 29, 2020 FINDINGS: The heart size and mediastinal contours are within normal limits. Both lungs are clear. The visualized skeletal structures are unremarkable. IMPRESSION: No active disease. Electronically Signed   By: Dorise Bullion III M.D.   On: 06/06/2021 09:22    Labs: BNP (last 3 results) No results for input(s): BNP in the last 8760 hours. Basic Metabolic Panel: Recent Labs  Lab 06/06/21 0800 06/07/21 0408  NA 142 138  K 4.0 3.5  CL 109 107  CO2 24 26  GLUCOSE 161* 110*  BUN  9 9  CREATININE 0.87 0.85  CALCIUM 9.3 8.3*   Liver Function Tests: Recent Labs  Lab 06/06/21 0800 06/07/21 0408  AST 20 18  ALT 15 14  ALKPHOS 64 50  BILITOT 0.8 0.7  PROT 6.5 5.3*  ALBUMIN 3.8 2.9*    Recent Labs  Lab 06/06/21 0800  LIPASE 33   No results for input(s): AMMONIA in the last 168 hours. CBC: Recent Labs  Lab 06/06/21 0800 06/07/21 0408  WBC 11.1* 6.5  NEUTROABS 9.6*  --   HGB 13.9 11.8*  HCT 42.7 35.5*  MCV 92.0 89.9  PLT 229 185   Cardiac Enzymes: No results for input(s): CKTOTAL, CKMB, CKMBINDEX, TROPONINI in the last 168 hours. BNP: Invalid input(s): POCBNP CBG: No results for input(s): GLUCAP in the last 168 hours. D-Dimer Recent Labs    06/06/21 0800  DDIMER 0.37   Hgb A1c Recent Labs    06/07/21 0408  HGBA1C 6.1*   Lipid Profile No results for input(s): CHOL, HDL, LDLCALC, TRIG, CHOLHDL, LDLDIRECT in the last 72 hours. Thyroid function studies No results for input(s): TSH, T4TOTAL, T3FREE, THYROIDAB in the last 72 hours.  Invalid input(s): FREET3 Anemia work up No results for input(s): VITAMINB12, FOLATE, FERRITIN, TIBC, IRON, RETICCTPCT in the last 72 hours. Urinalysis    Component Value Date/Time   COLORURINE STRAW (A) 06/06/2021 1105   APPEARANCEUR CLEAR 06/06/2021 1105   LABSPEC 1.012 06/06/2021 1105   PHURINE 7.0 06/06/2021 1105   GLUCOSEU NEGATIVE 06/06/2021 1105   [place] 06/06/2021 1105   [place] 06/06/2021 1105   KETONESUR NEGATIVE 06/06/2021 1105   PROTEINUR NEGATIVE 06/06/2021 1105   NITRITE NEGATIVE 06/06/2021 1105   LEUKOCYTESUR NEGATIVE 06/06/2021 1105   Sepsis Labs Invalid input(s): PROCALCITONIN,  WBC,  LACTICIDVEN Microbiology Recent Results (from the past 240 hour(s))  Blood culture (routine x 2)     Status: None (Preliminary result)   Collection Time: 06/06/21  8:01 AM   Specimen: BLOOD  Result Value Ref Range Status   Specimen Description BLOOD LEFT ANTECUBITAL  Final   Special Requests   Final    BOTTLES DRAWN AEROBIC AND ANAEROBIC Blood Culture adequate volume   Culture   Final    NO GROWTH 2 DAYS Performed at [facility], [place] [address], [place], [place] [address]     Report Status PENDING  Incomplete  Resp Panel by RT-PCR (Flu A&B, Covid) Nasopharyngeal Swab     Status: None   Collection Time: 06/06/21  8:01 AM   Specimen: Nasopharyngeal Swab; Nasopharyngeal(NP) swabs in vial transport medium  Result Value Ref Range Status   SARS Coronavirus 2 by RT PCR NEGATIVE NEGATIVE Final    Comment: (NOTE) SARS-CoV-2 target nucleic acids are NOT DETECTED.  The SARS-CoV-2 RNA is generally detectable in upper respiratory specimens during the acute phase of infection. The lowest concentration of SARS-CoV-2 viral copies this assay can detect is 138 copies/mL. A negative result does not preclude SARS-Cov-2 infection and should not be used as the sole basis for treatment or other patient management decisions. A negative result may occur with  improper specimen collection/handling, submission of specimen other than nasopharyngeal swab, presence of viral mutation(s) within the areas targeted by this assay, and inadequate number of viral copies(<138 copies/mL). A negative result must be combined with clinical observations, patient history, and epidemiological information. The expected result is Negative.  Fact Sheet for Patients:  EntrepreneurPulse.com.au  Fact Sheet for Healthcare Providers:  IncredibleEmployment.be  This test is  no t yet approved or cleared by the [place] and  has been authorized for detection and/or diagnosis of SARS-CoV-2 by FDA under an Emergency Use Authorization (EUA). This EUA will remain  in effect (meaning this test can be used) for the duration of the COVID-19 declaration under Section 564(b)(1) of the Act, 21 U.S.C.section 360bbb-3(b)(1), unless the authorization is terminated  or revoked sooner.       Influenza A by PCR NEGATIVE NEGATIVE Final   Influenza B by PCR NEGATIVE NEGATIVE Final    Comment: (NOTE) The Xpert Xpress SARS-CoV-2/FLU/RSV plus assay is intended as an aid in the  diagnosis of influenza from Nasopharyngeal swab specimens and should not be used as a sole basis for treatment. Nasal washings and aspirates are unacceptable for Xpert Xpress SARS-CoV-2/FLU/RSV testing.  Fact Sheet for Patients: EntrepreneurPulse.com.au  Fact Sheet for Healthcare Providers: IncredibleEmployment.be  This test is not yet approved or cleared by the [place] FDA and has been authorized for detection and/or diagnosis of SARS-CoV-2 by FDA under an Emergency Use Authorization (EUA). This EUA will remain in effect (meaning this test can be used) for the duration of the COVID-19 declaration under Section 564(b)(1) of the Act, 21 U.S.C. section 360bbb-3(b)(1), unless the authorization is terminated or revoked.  Performed at [facility], [place] [address] [place], [place] [address]   Blood culture (routine x 2)     Status: None (Preliminary result)   Collection Time: 06/06/21  8:06 AM   Specimen: BLOOD  Result Value Ref Range Status   Specimen Description BLOOD RIGHT ANTECUBITAL  Final   Special Requests   Final    BOTTLES DRAWN AEROBIC AND ANAEROBIC Blood Culture adequate volume   Culture   Final    NO GROWTH 2 DAYS Performed at [facility], [place] [address], [place], [place] [address]    Report Status PENDING  Incomplete  Urine Culture     Status: None   Collection Time: 06/06/21  8:08 AM   Specimen: In/Out Cath Urine  Result Value Ref Range Status   Specimen Description IN/OUT CATH URINE  Final   Special Requests NONE  Final   Culture   Final    NO GROWTH Performed at [facility], [place] [address], [place], [place] [address]    Report Status 06/07/2021 FINAL  Final  Respiratory (~20 pathogens) panel by PCR     Status: Abnormal   Collection Time: 06/06/21  9:00 AM   Specimen: Nasopharyngeal Swab; Respiratory  Result Value Ref Range Status   Adenovirus NOT DETECTED NOT DETECTED Final   Coronavirus 229E NOT  DETECTED NOT DETECTED Final    Comment: (NOTE) The Coronavirus on the Respiratory Panel, DOES NOT test for the novel  Coronavirus (2019 nCoV)    Coronavirus HKU1 NOT DETECTED NOT DETECTED Final   Coronavirus NL63 NOT DETECTED NOT DETECTED Final   Coronavirus OC43 NOT DETECTED NOT DETECTED Final   Metapneumovirus NOT DETECTED NOT DETECTED Final   Rhinovirus / Enterovirus DETECTED (A) NOT DETECTED Final   Influenza A NOT DETECTED NOT DETECTED Final   Influenza B NOT DETECTED NOT DETECTED Final   Parainfluenza Virus 1 NOT DETECTED NOT DETECTED Final   Parainfluenza Virus 2 NOT DETECTED NOT DETECTED Final   Parainfluenza Virus 3 NOT DETECTED NOT DETECTED Final   Parainfluenza Virus 4 NOT DETECTED NOT DETECTED Final   Respiratory Syncytial Virus NOT DETECTED NOT DETECTED Final   Bordetella pertussis NOT DETECTED NOT DETECTED  Final   Bordetella Parapertussis NOT DETECTED NOT DETECTED Final   Chlamydophila pneumoniae NOT DETECTED NOT DETECTED Final   Mycoplasma pneumoniae NOT DETECTED NOT DETECTED Final    Comment: Performed at [facility], [place] [address], [place], [place] [address]  Expectorated Sputum Assessment w Gram Stain, Rflx to Resp Cult     Status: None   Collection Time: 06/06/21 12:00 PM   Specimen: Sputum  Result Value Ref Range Status   Specimen Description SPUTUM  Final   Special Requests NONE  Final   Sputum evaluation   Final    THIS SPECIMEN IS ACCEPTABLE FOR SPUTUM CULTURE Performed at [facility], [place] [address] [place], [place] [address]    Report Status 06/06/2021 FINAL  Final  MRSA Next Gen by PCR, Nasal     Status: None   Collection Time: 06/06/21 12:00 PM   Specimen: Nasal Mucosa; Nasal Swab  Result Value Ref Range Status   MRSA by PCR Next Gen NOT DETECTED NOT DETECTED Final    Comment: (NOTE) The GeneXpert MRSA Assay (FDA approved for NASAL specimens only), is one component of a comprehensive MRSA colonization surveillance program. It is not  intended to diagnose MRSA infection nor to guide or monitor treatment for MRSA infections. Test performance is not FDA approved in patients less than 39 years old. Performed at [facility], [facility] [address], [place], [place] [address]   Culture, Respiratory w Gram Stain     Status: None (Preliminary result)   Collection Time: 06/06/21 12:00 PM   Specimen: SPU  Result Value Ref Range Status   Specimen Description SPUTUM  Final   Special Requests NONE Reflexed from [id number]  Final   Gram Stain   Final    ABUNDANT WBC PRESENT, PREDOMINANTLY PMN RARE GRAM POSITIVE COCCI IN PAIRS RARE GRAM POSITIVE RODS    Culture   Final    Normal respiratory flora-no Staph aureus or Pseudomonas seen Performed at [facility], [place] [address], [place], [place] [address]    Report Status PENDING  Incomplete   Time coordinating discharge: 25 minutes  SIGNED: Antonieta Pert, MD  [facility] 06/08/2021, 11:05 AM  If 7PM-7AM, please contact night-coverage [email or web address]

## 2021-06-08 NOTE — TOC Transition Note (Signed)
Transition of Care (TOC) - CM/SW Discharge Note Marvetta Gibbons RN, BSN Transitions of Care Unit 4E- RN Case Manager See Treatment Team for direct phone #    Patient Details  Name: Darlene [name] MRN: [id number] Date of Birth: August 26, 1933  Transition of Care [facility]) CM/SW Contact:  Dawayne Patricia, RN Phone Number: 06/08/2021, 10:59 AM   Clinical Narrative:    Pt stable for transition back to [facility], Transition of Care Department [facility]) has reviewed patient and no TOC needs have been identified at this time.    Final next level of care: Home/Self Care Barriers to Discharge: No Barriers Identified   Patient Goals and CMS Choice     Choice offered to / list presented to : NA  Discharge Placement               ILF/Home        Discharge Plan and Services     Post Acute Care Choice: NA                               Social Determinants of Health (SDOH) Interventions     Readmission Risk Interventions    06/08/2021   10:59 AM  Readmission Risk Prevention Plan  Transportation Screening Complete  PCP or Specialist Appt within 5-7 Days Complete  Home Care Screening Complete  Medication Review (RN CM) Complete

## 2021-06-08 NOTE — Telephone Encounter (Signed)
Inbound call from patient stating she was in the hospital with Sepsis and that Ellouise Newer had been to see her. Patient stated that she needed an appointment and I gave her the soonest appointment (6/12). Patient stated she was supposed to fly out tomorrow to see her grandchild graduate and was hoping to get an okay from the Dr.  Patient is requesting a call back to discuss. Please advise.

## 2021-06-08 NOTE — Telephone Encounter (Signed)
Patient called in seeking advice from MD about whether or not she should fly to [place] tomorrow given her hospital admission. Per Estill Bamberg, [place] since patient was not seen by GI, we could not provide any recommendations in regards to her trip, however she should call PCP. Patient said that in all honesty, the hospitalist did not recommended she travel due to her circumstances. Patient is still currently admitted, and I advise that she speak with her nurse and hospitalist for further recommendations. Patient verbalized understanding, no further questions.

## 2021-06-09 LAB — CULTURE, RESPIRATORY W GRAM STAIN: Culture: NORMAL

## 2021-06-11 LAB — CULTURE, BLOOD (ROUTINE X 2)
Culture: NO GROWTH
Culture: NO GROWTH
Special Requests: ADEQUATE
Special Requests: ADEQUATE

## 2021-06-19 NOTE — Telephone Encounter (Signed)
Left message for patient to call back  

## 2021-06-19 NOTE — Telephone Encounter (Signed)
Patient returned call & asked that our office send in an ENT referral. However, patient is already established with [facility] (with 2 different offices in the last couple of years). Patient has been advised that she should contact their office to make an appointment since she is already a known patient. Patient verbalized understanding, and states she will call their office on Monday.

## 2021-06-19 NOTE — Telephone Encounter (Signed)
Requesting an expedited ENT referral. Says her PCP is not available and our APP was the one that recommended her to see ENT. Requesting a call back today please [phone].

## 2021-06-22 NOTE — Telephone Encounter (Signed)
Patient called in stating she is unable to see the ENT until 07/12/21, and that is too long to wait. She is having ongoing difficulties with coughing & sore throat, and is currently on her way back from [place]. Patient has been advised that she should call ENT office & request to speak with a nurse to see if MD can provide recommendations in the meantime. Patient verbalized understanding, no further questions.

## 2021-06-22 NOTE — Telephone Encounter (Signed)
Inbound call from patient stating she will not be able to see a ENT until June 25th and she is scared that if she waits that long she will go back in the hospital. Patient is requesting a call back to discuss. Please advise.

## 2021-06-23 NOTE — Telephone Encounter (Signed)
Spoke with patient regarding PA recommendations. She is currently taking omeprazole BID. She would like to hold off on the pepcid for now. She plans to call ENT office today to speak with a nurse.

## 2021-06-26 ENCOUNTER — Encounter: Payer: Self-pay | Admitting: Orthopedic Surgery

## 2021-06-26 ENCOUNTER — Encounter: Payer: Medicare HMO | Admitting: Orthopedic Surgery

## 2021-06-28 NOTE — Progress Notes (Signed)
This encounter was created in error - please disregard.

## 2021-07-02 ENCOUNTER — Encounter: Payer: Medicare HMO | Admitting: Nurse Practitioner

## 2021-07-09 ENCOUNTER — Encounter: Payer: Self-pay | Admitting: Orthopedic Surgery

## 2021-07-09 ENCOUNTER — Ambulatory Visit (INDEPENDENT_AMBULATORY_CARE_PROVIDER_SITE_OTHER): Payer: Medicare HMO | Admitting: Orthopedic Surgery

## 2021-07-09 VITALS — BP 156/82 | HR 73 | Temp 96.2°F | Ht 62.0 in | Wt 156.6 lb

## 2021-07-09 DIAGNOSIS — I10 Essential (primary) hypertension: Secondary | ICD-10-CM | POA: Diagnosis not present

## 2021-07-09 DIAGNOSIS — F419 Anxiety disorder, unspecified: Secondary | ICD-10-CM

## 2021-07-09 DIAGNOSIS — J329 Chronic sinusitis, unspecified: Secondary | ICD-10-CM | POA: Diagnosis not present

## 2021-07-09 NOTE — Patient Instructions (Addendum)
Take your blood pressure 2x/ day- once in the AM and PM- do not take if increased pain or anxiety- report pressures to provider if they are averaging > 160/90  Meditation or deep breathing to help with anxiety  2

## 2021-07-09 NOTE — Progress Notes (Signed)
Careteam: Patient Care Team: Mansouraty, Telford Nab., MD as PCP - General (Gastroenterology) Silvestre Gunner, Anderson [place], PA-C (Inactive) (Dermatology) Adrian Prows, MD as Consulting Physician (Cardiology) Mansouraty, Telford Nab., MD as Consulting Physician (Gastroenterology) Rozetta Nunnery, MD (Inactive) as Consulting Physician (Otolaryngology) Warren Danes, PA-C as Physician Assistant (Dermatology)  Seen by: Windell Moulding, AGNP-C  PLACE OF SERVICE:  [place] Directive information Does Patient Have a Medical Advance Directive?: Yes, Type of Advance Directive: [place], Does patient want to make changes to medical advance directive?: No - Patient declined  Allergies  Allergen Reactions   Lactose Intolerance (Gi) Other (See Comments)    indigestion   Norco [Hydrocodone-Acetaminophen] Nausea And Vomiting   Dust Mite Extract Other (See Comments)    Sinus drainage   Oxycontin [Oxycodone Hcl] Nausea And Vomiting   Pollen Extract Other (See Comments)    Sinus drainage    Chief Complaint  Patient presents with   Acute Visit    Patient returns to the office for increased blood pressure and bleeding issue.      HPI: Patient is a 88 y.o. female seen today for elevated blood pressure and bleeding issues.   She has been taking her blood pressure lately due to increased stress. BP readings 150/90 or less. She is taking losartan. Denies sob, dizziness, blurred vision or headaches.   Increased stress related to ethmoidal sinusitis and eustachian tube dysfunction. ENT retired. She was upset new provider did not recommend any new interventions. She is working on seeing another provider recommended by Dr. Jennell Corner office. Continues to have ear fullness most of the day.   Reports having increased anxiety. No panic attacks. Continues to see therapist.   Reports one episode of rectal bleeding within past week. No further incidents. Denies hard stools.    Reports one nose bleed within past week. Admits to using netty pot and nasal spray with nasal congestion. Discussed only using nasal sprays.    Review of Systems:  Review of Systems  Constitutional:  Negative for chills, fever, malaise/fatigue and weight loss.  HENT:  Positive for congestion.        Ear fullness  Eyes:  Negative for discharge and redness.  Respiratory:  Negative for cough, shortness of breath and wheezing.   Cardiovascular:  Negative for chest pain and leg swelling.  Skin:  Negative for rash.  Neurological:  Negative for dizziness and headaches.  Psychiatric/Behavioral:  Negative for depression. The patient is nervous/anxious.     Past Medical History:  Diagnosis Date   Anxiety    Atypical mole 11/11/2020   Left Inner Knee (moderate-free)   Colon polyps    Depression    Hyperlipidemia    Hypertension    Hypothyroidism    Melanoma in situ ([place]) 09/19/2018   Right Arm (excision)   Skin cancer    Sleep apnea    Stroke [facility]) 2008   Past Surgical History:  Procedure Laterality Date   ADENOIDECTOMY     APPENDECTOMY  1960   CATARACT EXTRACTION Left    COLONOSCOPY     GUM SURGERY  03/2019   LUMBAR [place] SURGERY  2014   MELANOMA EXCISION  2020   MESH APPLIED TO LAP PORT     TONSILLECTOMY  1940   Social History:   reports that she has never smoked. She has never used smokeless tobacco. She reports that she does not currently use alcohol. She reports that she does not use drugs.  Family  History  Problem Relation Age of Onset   Lung cancer Mother 68       smoker   Heart disease Mother    Stroke Father 39   Heart attack Father    Allergic rhinitis Sister    Hypertension Sister    CVA Brother    Lung cancer Maternal Grandfather    Heart attack Paternal Grandfather    Melanoma Daughter    Psoriasis Daughter    Rheum arthritis Daughter    Bipolar disorder Daughter    Colon cancer Neg Hx    Esophageal cancer Neg Hx    Inflammatory bowel disease Neg  Hx    Liver disease Neg Hx    Pancreatic cancer Neg Hx    Stomach cancer Neg Hx    Rectal cancer Neg Hx     Medications: Patient's Medications  New Prescriptions   No medications on file  Previous Medications   ACETAMINOPHEN (TYLENOL) 325 MG TABLET    Take 650 mg by mouth every 6 (six) hours as needed for headache (pain).   ALBUTEROL (VENTOLIN HFA) 108 (90 BASE) MCG/ACT INHALER    Inhale 2 puffs into the lungs every 6 (six) hours as needed for wheezing or shortness of breath.   AZELASTINE (ASTELIN) 0.1 % NASAL SPRAY    Place 1 to 2 sprays each nostril 1-2 times a day as needed for runny nose/drainage down throat   BENZOCAINE-MENTHOL (CEPACOL) 15-2.3 MG LOZG    Use as directed 1 lozenge in the mouth or throat 3 (three) times daily as needed.   CARAWAY OIL-LEVOMENTHOL (FDGARD) 25-20.75 MG CAPS    Take one capsule by mouth twice daily.   CLOPIDOGREL (PLAVIX) 75 MG TABLET    TAKE 1 TABLET BY MOUTH EVERY DAY   DIAZEPAM (VALIUM) 5 MG TABLET    Take 0.5 tablets (2.5 mg total) by mouth as needed (anxiety). WILL NEED APPOINTMENT FOR ADDITIONAL REFILLS   FLUTICASONE (FLONASE) 50 MCG/ACT NASAL SPRAY    Place 1-2 sprays into both nostrils daily.   KETOCONAZOLE (NIZORAL) 2 % SHAMPOO    APPLY 1 APPLICATION TOPICALLY TWICE WEEKLY AS NEEDED FOR SCALP IRRITATION.   LORATADINE (CLARITIN) 10 MG TABLET    Take 1 tablet (10 mg total) by mouth daily as needed for up to 15 days for allergies.   LOSARTAN (COZAAR) 50 MG TABLET    Take 50 mg by mouth at bedtime.   NITROGLYCERIN (NITROSTAT) 0.4 MG SL TABLET    Place 1 tablet (0.4 mg total) under the tongue every 5 (five) minutes as needed for up to 25 days for chest pain.   OMEPRAZOLE (PRILOSEC) 40 MG CAPSULE    Take 40 mg by mouth daily with lunch.   POLYETHYL GLYCOL-PROPYL GLYCOL (SYSTANE) 0.4-0.3 % GEL OPHTHALMIC GEL    Place 1 application. into both eyes at bedtime.   POLYETHYL GLYCOL-PROPYL GLYCOL (SYSTANE) 0.4-0.3 % SOLN    Place 1 drop into both eyes every  morning.   PRAVASTATIN (PRAVACHOL) 40 MG TABLET    TAKE 1 TABLET BY MOUTH EVERYDAY AT BEDTIME   SYNTHROID 100 MCG TABLET    TAKE 1 TABLET BY MOUTH EVERY DAY BEFORE BREAKFAST   VIBEGRON (GEMTESA) 75 MG TABS    Take 75 mg by mouth at bedtime.   ZOLPIDEM TARTRATE 3.5 MG SUBL    Take 1 tablet by mouth at bedtime as needed.  Modified Medications   No medications on file  Discontinued Medications   No medications on file  Physical Exam:  Vitals:   07/09/21 1102  BP: (!) 156/82  Pulse: 73  Temp: (!) 96.2 F (35.7 C)  SpO2: 99%  Weight: 156 lb 9.6 oz (71 kg)  Height: '5\' 2"'$  (1.575 m)   Body mass index is 28.64 kg/m. Wt Readings from Last 3 Encounters:  07/09/21 156 lb 9.6 oz (71 kg)  06/06/21 156 lb 1.4 oz (70.8 kg)  05/14/21 156 lb (70.8 kg)    Physical Exam Vitals reviewed.  Constitutional:      General: She is not in acute distress. HENT:     Head: Normocephalic.  Eyes:     General:        Right eye: No discharge.        Left eye: No discharge.  Cardiovascular:     Rate and Rhythm: Normal rate and regular rhythm.     Pulses: Normal pulses.     Heart sounds: Normal heart sounds.  Pulmonary:     Effort: Pulmonary effort is normal. No respiratory distress.     Breath sounds: Normal breath sounds. No wheezing.  Abdominal:     General: Bowel sounds are normal. There is no distension.     Palpations: Abdomen is soft.     Tenderness: There is no abdominal tenderness.  Musculoskeletal:     Cervical back: Neck supple.     Right lower leg: No edema.     Left lower leg: No edema.  Skin:    General: Skin is warm and dry.     Capillary Refill: Capillary refill takes less than 2 seconds.  Neurological:     General: No focal deficit present.     Mental Status: She is alert and oriented to person, place, and time.  Psychiatric:        Mood and Affect: Mood is anxious.        Behavior: Behavior normal.     Labs reviewed: Basic Metabolic Panel: Recent Labs     12/02/20 0735 05/12/21 1153 06/06/21 0800 06/07/21 0408  NA  --  144 142 138  K  --  4.5 4.0 3.5  CL  --  106 109 107  CO2  --  '24 24 26  '$ GLUCOSE  --  79 161* 110*  BUN  --  '14 9 9  '$ CREATININE  --  0.79 0.87 0.85  CALCIUM  --  9.9 9.3 8.3*  TSH 1.80  --   --   --    Liver Function Tests: Recent Labs    06/06/21 0800 06/07/21 0408  AST 20 18  ALT 15 14  ALKPHOS 64 50  BILITOT 0.8 0.7  PROT 6.5 5.3*  ALBUMIN 3.8 2.9*   Recent Labs    06/06/21 0800  LIPASE 33   No results for input(s): "AMMONIA" in the last 8760 hours. CBC: Recent Labs    08/29/20 0427 06/06/21 0800 06/07/21 0408  WBC 7.5 11.1* 6.5  NEUTROABS  --  9.6*  --   HGB 13.2 13.9 11.8*  HCT 41.0 42.7 35.5*  MCV 91.3 92.0 89.9  PLT 253 229 185   Lipid Panel: Recent Labs    05/12/21 1151  CHOL 163  HDL 44  LDLCALC 94  TRIG 144   TSH: Recent Labs    12/02/20 0735  TSH 1.80   A1C: Lab Results  Component Value Date   HGBA1C 6.1 (H) 06/07/2021     Assessment/Plan 1. Essential hypertension - elevated - suspect due to increased stress - BUN/creat  9/0.85 06/07/2021 - advised to take bp twice daily x 2 weeks- report to PCP if bp 160/90 - cont losartan  2. Chronic sinusitis, unspecified location - ongoing - trying to find new ENT - discussed reducing netty pot use - cont nasal sprays  3. Anxiety - little anxious today - cont therapy - recommend meditation and deep breathing techniques  Total time: 31 minutes. Greater than 50% of total time spent doing patient education regarding blood pressure, medication management, anxiety interventions.     Next appt: Visit date not found  [place], [facility] [phone]

## 2021-07-13 ENCOUNTER — Telehealth: Payer: Self-pay | Admitting: Physician Assistant

## 2021-07-13 ENCOUNTER — Encounter: Payer: Self-pay | Admitting: Gastroenterology

## 2021-07-13 ENCOUNTER — Other Ambulatory Visit: Payer: Self-pay | Admitting: *Deleted

## 2021-07-13 DIAGNOSIS — K219 Gastro-esophageal reflux disease without esophagitis: Secondary | ICD-10-CM

## 2021-07-13 MED ORDER — ZOLPIDEM TARTRATE 3.5 MG SL SUBL: 0.5000 | SUBLINGUAL_TABLET | Freq: Every evening | SUBLINGUAL | 5 refills | Status: DC | PRN

## 2021-07-13 MED ORDER — AZELASTINE HCL 0.1 % NA SOLN: NASAL | 5 refills | Status: DC

## 2021-07-13 NOTE — Telephone Encounter (Signed)
Spoke with patient & made her aware that there are no sooner appointments at [facility] time, however she has been placed on wait list so that if one does become available sooner then she will be notified. Patient advised to discuss any concerns with PCP in the meantime. Patient verbalized all understanding, no further questions.

## 2021-07-13 NOTE — Telephone Encounter (Signed)
Patient requested refills. Pended Rx's and sent to Amy for approval.  ?

## 2021-07-26 ENCOUNTER — Other Ambulatory Visit: Payer: Self-pay | Admitting: Nurse Practitioner

## 2021-07-27 NOTE — Telephone Encounter (Signed)
Patient has request refill on medication Plavix '75mg'$ . Patient is due, however medication has High Risk Warnings. Medication pend and sent to Seth Bake, NP due to PCP [name], [name], NP being out of office. Please Advise.

## 2021-08-04 NOTE — Progress Notes (Deleted)
08/04/2021 Darlene [name] [id number] [birth year]  Referring provider: NP. Man [name] Primary GI doctor: Dr. Rush Landmark  ASSESSMENT AND PLAN:   There are no diagnoses linked to this encounter.  History of Present Illness:  88 y.o. female  with a past medical history of CVA (on Plavix), atrial fibrillation, anxiety/depression, sleep apnea, colon polyps, status post appendectomy, chronic sore throat, esophagitis, hiatal hernia, esophageal dysmotility, diverticulosis, hemorrhoids, colonic AVM, adenomatous colon polyps 10/2018 and others listed below, returns to clinic today for evaluation of chronic sinusitis/GERD.  05/08/2021 patient seen in the office for abdominal bloating and diarrhea thought to be due to lactose has improved since stopping. Given FODMAP diet.  2021 EGD relook after treatment with empiric dilatation due to dysphagia with mild improvement. Subsequent barium swallow showed marked dysmotility 10/26/2018 endoscopy for reflux showed LA grade C esophagitis, 4 cm hiatal hernia multiple gastric polyps 10/26/2018 colonoscopy nonbleeding nonthrombosed external and internal hemorrhoids, diverticulosis diffuse, 5 (2 adenomatous and 3 sessile serrated) polyps 2-13 millimeters in size, single colonic angiectasia, normally would recommend colonoscopy 3 years  Current Medications:   Current Outpatient Medications (Endocrine & Metabolic):    SYNTHROID 809 MCG tablet, TAKE 1 TABLET BY MOUTH EVERY DAY BEFORE BREAKFAST (Patient taking differently: Take 100 mcg by mouth daily before breakfast.)  Current Outpatient Medications (Cardiovascular):    losartan (COZAAR) 50 MG tablet, Take 50 mg by mouth at bedtime.   nitroGLYCERIN (NITROSTAT) 0.4 MG SL tablet, Place 1 tablet (0.4 mg total) under the tongue every 5 (five) minutes as needed for up to 25 days for chest pain.   pravastatin (PRAVACHOL) 40 MG tablet, TAKE 1 TABLET BY MOUTH EVERYDAY AT BEDTIME (Patient taking differently: Take 40 mg by  mouth at bedtime.)  Current Outpatient Medications (Respiratory):    albuterol (VENTOLIN HFA) 108 (90 Base) MCG/ACT inhaler, Inhale 2 puffs into the lungs every 6 (six) hours as needed for wheezing or shortness of breath. (Patient not taking: Reported on 07/09/2021)   azelastine (ASTELIN) 0.1 % nasal spray, Place 1 to 2 sprays each nostril 1-2 times a day as needed for runny nose/drainage down throat   fluticasone (FLONASE) 50 MCG/ACT nasal spray, Place 1-2 sprays into both nostrils daily. (Patient taking differently: Place 1 spray into both nostrils every morning.)   loratadine (CLARITIN) 10 MG tablet, Take 1 tablet (10 mg total) by mouth daily as needed for up to 15 days for allergies.  Current Outpatient Medications (Analgesics):    acetaminophen (TYLENOL) 325 MG tablet, Take 650 mg by mouth every 6 (six) hours as needed for headache (pain).  Current Outpatient Medications (Hematological):    clopidogrel (PLAVIX) 75 MG tablet, TAKE 1 TABLET BY MOUTH EVERY DAY  Current Outpatient Medications (Other):    Benzocaine-Menthol (CEPACOL) 15-2.3 MG LOZG, Use as directed 1 lozenge in the mouth or throat 3 (three) times daily as needed.   Caraway Oil-Levomenthol (FDGARD) 25-20.75 MG CAPS, Take one capsule by mouth twice daily. (Patient taking differently: Take 1 capsule by mouth daily with supper.)   diazepam (VALIUM) 5 MG tablet, Take 0.5 tablets (2.5 mg total) by mouth as needed (anxiety). WILL NEED APPOINTMENT FOR ADDITIONAL REFILLS (Patient taking differently: Take 2.5 mg by mouth daily as needed for anxiety. WILL NEED APPOINTMENT FOR ADDITIONAL REFILLS)   ketoconazole (NIZORAL) 2 % shampoo, APPLY 1 APPLICATION TOPICALLY TWICE WEEKLY AS NEEDED FOR SCALP IRRITATION. (Patient taking differently: 1 application  2 (two) times a week. Sunday and Wednesday)   omeprazole (PRILOSEC) 40 MG capsule, Take  40 mg by mouth daily with lunch.   Polyethyl Glycol-Propyl Glycol (SYSTANE) 0.4-0.3 % GEL ophthalmic gel,  Place 1 application. into both eyes at bedtime.   Polyethyl Glycol-Propyl Glycol (SYSTANE) 0.4-0.3 % SOLN, Place 1 drop into both eyes every morning.   Vibegron (GEMTESA) 75 MG TABS, Take 75 mg by mouth at bedtime.   Zolpidem Tartrate 3.5 MG SUBL, Take 0.5 tablets by mouth at bedtime as needed.  Surgical History:  She  has a past surgical history that includes Lumbar disc surgery (2014); Appendectomy (1960); Tonsillectomy (1940); Adenoidectomy; Mesh applied to lap port; Colonoscopy; Gum surgery (03/2019); Melanoma excision (2020); and Cataract extraction (Left). Family History:  Her family history includes Allergic rhinitis in her sister; Bipolar disorder in her daughter; CVA in her brother; Heart attack in her father and paternal grandfather; Heart disease in her mother; Hypertension in her sister; Lung cancer in her maternal grandfather; Lung cancer (age of onset: 51) in her mother; Melanoma in her daughter; Psoriasis in her daughter; Rheum arthritis in her daughter; Stroke (age of onset: 55) in her father. Social History:   reports that she has never smoked. She has never used smokeless tobacco. She reports that she does not currently use alcohol. She reports that she does not use drugs.  Current Medications, Allergies, Past Medical History, Past Surgical History, Family History and Social History were reviewed in [employer] record.  Physical Exam: There were no vitals taken for this visit. General:   Pleasant, well developed female in no acute distress Heart : Regular rate and rhythm; no murmurs Pulm: Clear anteriorly; no wheezing Abdomen:  {BlankSingle:19197::"Distended","Ridged","Soft"}, {BlankSingle:19197::"Flat","Obese","Non-distended"} AB, {BlankSingle:19197::"Absent","Hyperactive, tinkling","Hypoactive","Sluggish","Active"} bowel sounds. {actendernessAB:27319} tenderness {anatomy; site abdomen:5010}. {BlankMultiple:19196::"Without guarding","With  guarding","Without rebound","With rebound"}, No organomegaly appreciated. Rectal: {acrectalexam:27461} Extremities:  {With/without:5700}  edema. Neurologic:  Alert and  oriented x4;  No focal deficits.  Psych:  Cooperative. Normal mood and affect.   Vladimir Crofts, PA-C 08/04/21

## 2021-08-06 ENCOUNTER — Ambulatory Visit: Payer: Medicare HMO | Admitting: Physician Assistant

## 2021-08-20 ENCOUNTER — Encounter: Payer: Self-pay | Admitting: Nurse Practitioner

## 2021-08-20 ENCOUNTER — Non-Acute Institutional Stay: Payer: Medicare HMO | Admitting: Nurse Practitioner

## 2021-08-20 DIAGNOSIS — M5432 Sciatica, left side: Secondary | ICD-10-CM

## 2021-08-20 DIAGNOSIS — G4733 Obstructive sleep apnea (adult) (pediatric): Secondary | ICD-10-CM

## 2021-08-20 DIAGNOSIS — Z780 Asymptomatic menopausal state: Secondary | ICD-10-CM

## 2021-08-20 DIAGNOSIS — M8580 Other specified disorders of bone density and structure, unspecified site: Secondary | ICD-10-CM

## 2021-08-20 DIAGNOSIS — J312 Chronic pharyngitis: Secondary | ICD-10-CM

## 2021-08-20 DIAGNOSIS — E039 Hypothyroidism, unspecified: Secondary | ICD-10-CM

## 2021-08-20 DIAGNOSIS — F5101 Primary insomnia: Secondary | ICD-10-CM

## 2021-08-20 DIAGNOSIS — I10 Essential (primary) hypertension: Secondary | ICD-10-CM

## 2021-08-20 DIAGNOSIS — R400 Somnolence: Secondary | ICD-10-CM | POA: Diagnosis not present

## 2021-08-20 DIAGNOSIS — J329 Chronic sinusitis, unspecified: Secondary | ICD-10-CM | POA: Diagnosis not present

## 2021-08-20 DIAGNOSIS — E782 Mixed hyperlipidemia: Secondary | ICD-10-CM

## 2021-08-20 DIAGNOSIS — H9190 Unspecified hearing loss, unspecified ear: Secondary | ICD-10-CM

## 2021-08-20 DIAGNOSIS — R1319 Other dysphagia: Secondary | ICD-10-CM

## 2021-08-20 DIAGNOSIS — Z8673 Personal history of transient ischemic attack (TIA), and cerebral infarction without residual deficits: Secondary | ICD-10-CM

## 2021-08-20 DIAGNOSIS — K449 Diaphragmatic hernia without obstruction or gangrene: Secondary | ICD-10-CM

## 2021-08-20 DIAGNOSIS — R32 Unspecified urinary incontinence: Secondary | ICD-10-CM

## 2021-08-20 DIAGNOSIS — E739 Lactose intolerance, unspecified: Secondary | ICD-10-CM

## 2021-08-20 NOTE — Assessment & Plan Note (Signed)
on plavix. Statin,  residual of  poor left peripheral vision

## 2021-08-20 NOTE — Assessment & Plan Note (Addendum)
Sleeps too much during day, failed dc Ambien, Valium, refused CPAP, feels a veil in front of left eye, more noticeable left facial weakness. Desires CT head w/o contrast to r/o possible CVA.

## 2021-08-20 NOTE — Assessment & Plan Note (Signed)
Urinary frequency/leakage, 2-3x/night, stopped taking Gemtesa, acupuncture improved initially. Saw urology.  

## 2021-08-20 NOTE — Assessment & Plan Note (Signed)
audiology eval, ENT placed tube in the right ear, hearing aids

## 2021-08-20 NOTE — Assessment & Plan Note (Signed)
on Levothyroxine, TSH 1.80 12/02/20 

## 2021-08-20 NOTE — Assessment & Plan Note (Signed)
takes Pravastatin, LDL 94 05/12/21 

## 2021-08-20 NOTE — Assessment & Plan Note (Signed)
Hx of chronic rhinitis, SOB, wheezing,  on Astelin, Flonase, Bronchodilator, underwent Pulmonology, ENT, GI, Allergy evaluation, had spirometry 10/20/20, Echo 60-65% EF 04/16/20, CTA 09/01/20

## 2021-08-20 NOTE — Assessment & Plan Note (Signed)
Esophageal dysmotility, Swallow studay 05/12/20, worked with [facility]

## 2021-08-20 NOTE — Assessment & Plan Note (Signed)
Osteopenia, DEXA 12/25/20, t score -1.0,  10 years possibility major fx 11%, hip fx 2.6%. Vit D, Cal-diet rich,  Vit D 3 39 12/26/20 

## 2021-08-20 NOTE — Assessment & Plan Note (Signed)
blood pressure is controlled on Losartan qd. Followed by Cardiology. CTA negative 09/01/20. Bun/creat 9/0.85 06/07/21

## 2021-08-20 NOTE — Assessment & Plan Note (Addendum)
Ambulates independently, saw Ortho, Cervical spinal stenosis, MR 02/26/20, C6-C7

## 2021-08-20 NOTE — Assessment & Plan Note (Signed)
sleep study, saw Pulmonology. Refuse using CPAP

## 2021-08-20 NOTE — Progress Notes (Signed)
Location:   clinic [place] Room Number: fox Place of Service:  Home (12) Provider: Marlana Latus NP  Code Status: DNR Goals of Care: [place]    08/20/2021    9:32 AM  Advanced Directives  Does Patient Have a Medical Advance Directive? Yes  Type of Paramedic of [place];Living will  Does patient want to make changes to medical advance directive? No - Patient declined  Copy of [place] in Chart? Yes - validated most recent copy scanned in chart (See row information)     Chief Complaint  Patient presents with   Acute Visit    Patient is being seen for hypersomnia     HPI: Patient is a 88 y.o. female seen today for medical management of chronic diseases.    Sleeps too much during day, failed dc Ambien, Valium, refused CPAP, feels a veil in front of left eye, more noticeable left facial weakness.  Hx of chronic rhinitis, SOB, wheezing,  on Astelin, Flonase, Bronchodilator, underwent Pulmonology, ENT, GI, Allergy evaluation, had spirometry 10/20/20, Echo 60-65% EF 04/16/20, CTA 09/01/20                    Chronic sore throat: multiple factorials, GERD vs chronic rhinitis Insomnia Ambien, Valium             Hx of CVA, on plavix. Statin,  residual of  poor left peripheral vision  Hypothyroidism, on Levothyroxine, TSH 1.80 12/02/20             HTN, blood pressure is controlled on Losartan qd. Followed by Cardiology. CTA negative 09/01/20. Bun/creat 9/0.85 06/07/21            GERD, stable Omeprazole, FDgard helped,  saw GI, EGD 12/26/19. Vit B12 383 12/02/20, Hgb 11.8 06/07/21             Lactose intolerance, diet adjustment, f/u GI             Esophageal dysmotility, Swallow studay 05/12/20, worked with ST             OSA sleep study, saw Pulmonology. Refuse using CPAP             OA, saw Ortho. Cervical spinal stenosis, MR 02/26/20, C6-C7             Hyperlipidemia, takes Pravastatin, LDL 94 05/12/21             Urinary frequency/leakage,  2-3x/night, stopped taking Gemtesa, acupuncture improved initially. Saw urology.              Osteopenia, DEXA 12/25/20, t score -1.0,  10 years possibility major fx 11%, hip fx 2.6%. Vit D, Cal-diet rich,  Vit D 3 39 12/26/20             HOH audiology eval, ENT placed tube in the right ear, hearing aids      Past Medical History:  Diagnosis Date   Anxiety    Atypical mole 11/11/2020   Left Inner Knee (moderate-free)   Colon polyps    Depression    Hyperlipidemia    Hypertension    Hypothyroidism    Melanoma in situ ([place]) 09/19/2018   Right Arm (excision)   Skin cancer    Sleep apnea    Stroke [facility]) 2008    Past Surgical History:  Procedure Laterality Date   ADENOIDECTOMY     APPENDECTOMY  1960   CATARACT EXTRACTION Left    COLONOSCOPY  GUM SURGERY  03/2019   LUMBAR DISC SURGERY  2014   MELANOMA EXCISION  2020   MESH APPLIED TO LAP PORT     TONSILLECTOMY  1940    Allergies  Allergen Reactions   Lactose Intolerance (Gi) Other (See Comments)    indigestion   Norco [Hydrocodone-Acetaminophen] Nausea And Vomiting   Dust Mite Extract Other (See Comments)    Sinus drainage   Oxycontin [Oxycodone Hcl] Nausea And Vomiting   Pollen Extract Other (See Comments)    Sinus drainage    Allergies as of 08/20/2021       Reactions   Lactose Intolerance (gi) Other (See Comments)   indigestion   Norco [hydrocodone-acetaminophen] Nausea And Vomiting   Dust Mite Extract Other (See Comments)   Sinus drainage   Oxycontin [oxycodone Hcl] Nausea And Vomiting   Pollen Extract Other (See Comments)   Sinus drainage        Medication List        Accurate as of August 20, 2021 11:59 PM. If you have any questions, ask your nurse or doctor.          STOP taking these medications    albuterol 108 (90 Base) MCG/ACT inhaler Commonly known as: VENTOLIN HFA Stopped by: Lorely Bubb X Aizza Santiago, NP       TAKE these medications    acetaminophen 325 MG tablet Commonly known as:  TYLENOL Take 650 mg by mouth every 6 (six) hours as needed for headache (pain).   azelastine 0.1 % nasal spray Commonly known as: ASTELIN Place 1 to 2 sprays each nostril 1-2 times a day as needed for runny nose/drainage down throat   Cepacol 15-2.3 MG Lozg Generic drug: Benzocaine-Menthol Use as directed 1 lozenge in the mouth or throat 3 (three) times daily as needed.   clopidogrel 75 MG tablet Commonly known as: PLAVIX TAKE 1 TABLET BY MOUTH EVERY DAY   diazepam 5 MG tablet Commonly known as: VALIUM Take 0.5 tablets (2.5 mg total) by mouth as needed (anxiety). WILL NEED APPOINTMENT FOR ADDITIONAL REFILLS   FDgard 25-20.75 MG Caps Generic drug: [place] Oil-Levomenthol Take one capsule by mouth twice daily.   fluticasone 50 MCG/ACT nasal spray Commonly known as: FLONASE Place 1-2 sprays into both nostrils daily.   Gemtesa 75 MG Tabs Generic drug: Vibegron Take 75 mg by mouth at bedtime.   ketoconazole 2 % shampoo Commonly known as: NIZORAL APPLY 1 APPLICATION TOPICALLY TWICE WEEKLY AS NEEDED FOR SCALP IRRITATION.   loratadine 10 MG tablet Commonly known as: CLARITIN Take 1 tablet (10 mg total) by mouth daily as needed for up to 15 days for allergies.   losartan 50 MG tablet Commonly known as: COZAAR Take 50 mg by mouth at bedtime.   nitroGLYCERIN 0.4 MG SL tablet Commonly known as: NITROSTAT Place 1 tablet (0.4 mg total) under the tongue every 5 (five) minutes as needed for up to 25 days for chest pain.   omeprazole 40 MG capsule Commonly known as: PRILOSEC Take 40 mg by mouth daily with lunch.   pravastatin 40 MG tablet Commonly known as: PRAVACHOL TAKE 1 TABLET BY MOUTH EVERYDAY AT BEDTIME   Synthroid 100 MCG tablet Generic drug: levothyroxine TAKE 1 TABLET BY MOUTH EVERY DAY BEFORE BREAKFAST   Systane 0.4-0.3 % Soln Generic drug: Polyethyl Glycol-Propyl Glycol Place 1 drop into both eyes every morning.   Systane 0.4-0.3 % Gel ophthalmic gel Generic  drug: Polyethyl Glycol-Propyl Glycol Place 1 application. into both eyes at bedtime.  Zolpidem Tartrate 3.5 MG Subl Take 0.5 tablets by mouth at bedtime as needed.        Review of Systems:  Review of Systems  Constitutional:  Negative for fatigue, fever and unexpected weight change.  HENT:  Positive for congestion, hearing loss and sinus pressure. Negative for voice change.        Chronic sinusitis, underwent ENT, sleep study-sleep apnea.   Eyes:  Positive for visual disturbance.       Left lateral visual field deficit  Respiratory:  Negative for cough and shortness of breath.   Cardiovascular:  Negative for leg swelling.  Gastrointestinal:  Negative for abdominal pain and constipation.  Genitourinary:  Positive for frequency. Negative for dysuria and urgency.       Urinary leakage. Doing Kegel exercise.   Musculoskeletal:  Negative for back pain and gait problem.  Skin:  Negative for color change.  Neurological:  Negative for speech difficulty, weakness and headaches.  Psychiatric/Behavioral:  Positive for sleep disturbance. Negative for dysphoric mood. The patient is not nervous/anxious.        Early am awake, difficulty returning asleep.     Health Maintenance  Topic Date Due   Zoster Vaccines- Shingrix (2 of 2) 02/14/2019   COVID-19 Vaccine (4 - Booster for Moderna series) 07/31/2020   INFLUENZA VACCINE  08/18/2021   TETANUS/TDAP  04/03/2022 (Originally 11/26/2018)   COLONOSCOPY (Pts 45-30yr Insurance coverage will need to be confirmed)  10/25/2021   Pneumonia Vaccine 88 Years old  Completed   DEXA SCAN  Completed   HPV VACCINES  Aged Out    Physical Exam: Vitals:   08/20/21 1555  BP: 124/68  Pulse: (!) 57  Resp: 18  Temp: (!) 97.1 F (36.2 C)  SpO2: 98%  Weight: 156 lb 3.2 oz (70.9 kg)  Height: '5\' 2"'$  (1.575 m)   Body mass index is 28.57 kg/m. Physical Exam Vitals and nursing note reviewed.  Constitutional:      Appearance: Normal appearance.  HENT:      Head: Normocephalic and atraumatic.     Nose: Nose normal.     Mouth/Throat:     Mouth: Mucous membranes are moist.  Eyes:     Extraocular Movements: Extraocular movements intact.     Conjunctiva/sclera: Conjunctivae normal.     Pupils: Pupils are equal, round, and reactive to light.     Comments: Left peripheral visual field deficit since CVA 2006  Cardiovascular:     Rate and Rhythm: Normal rate and regular rhythm.     Heart sounds: No murmur heard. Pulmonary:     Effort: Pulmonary effort is normal.     Breath sounds: Rales present.     Comments: Bibasilar rales.  Abdominal:     General: Bowel sounds are normal.     Palpations: Abdomen is soft.     Tenderness: There is no abdominal tenderness.  Musculoskeletal:     Cervical back: Normal range of motion and neck supple.     Right lower leg: No edema.     Left lower leg: No edema.  Skin:    General: Skin is warm and dry.  Neurological:     General: No focal deficit present.     Mental Status: She is alert and oriented to person, place, and time. Mental status is at baseline.     Motor: Weakness present.     Gait: Gait normal.     Comments: Left facial weakness  Psychiatric:  Mood and Affect: Mood normal.        Behavior: Behavior normal.        Thought Content: Thought content normal.        Judgment: Judgment normal.     Labs reviewed: Basic Metabolic Panel: Recent Labs    12/02/20 0735 05/12/21 1153 06/06/21 0800 06/07/21 0408  NA  --  144 142 138  K  --  4.5 4.0 3.5  CL  --  106 109 107  CO2  --  '24 24 26  '$ GLUCOSE  --  79 161* 110*  BUN  --  '14 9 9  '$ CREATININE  --  0.79 0.87 0.85  CALCIUM  --  9.9 9.3 8.3*  TSH 1.80  --   --   --    Liver Function Tests: Recent Labs    06/06/21 0800 06/07/21 0408  AST 20 18  ALT 15 14  ALKPHOS 64 50  BILITOT 0.8 0.7  PROT 6.5 5.3*  ALBUMIN 3.8 2.9*   Recent Labs    06/06/21 0800  LIPASE 33   No results for input(s): "AMMONIA" in the last 8760  hours. CBC: Recent Labs    08/29/20 0427 06/06/21 0800 06/07/21 0408  WBC 7.5 11.1* 6.5  NEUTROABS  --  9.6*  --   HGB 13.2 13.9 11.8*  HCT 41.0 42.7 35.5*  MCV 91.3 92.0 89.9  PLT 253 229 185   Lipid Panel: Recent Labs    05/12/21 1151  CHOL 163  HDL 44  LDLCALC 94  TRIG 144   Lab Results  Component Value Date   HGBA1C 6.1 (H) 06/07/2021    Procedures since last visit: No results found.  Assessment/Plan  Daytime sleepiness Sleeps too much during day, failed dc Ambien, Valium, refused CPAP, feels a veil in front of left eye, more noticeable left facial weakness. Desires CT head w/o contrast to r/o possible CVA.  Chronic sinusitis Hx of chronic rhinitis, SOB, wheezing,  on Astelin, Flonase, Bronchodilator, underwent Pulmonology, ENT, GI, Allergy evaluation, had spirometry 10/20/20, Echo 60-65% EF 04/16/20, CTA 09/01/20         Chronic sore throat Chronic sore throat: multiple factorials, GERD vs chronic rhinitis  Insomnia Failed dc Ambien, Valium  History of CVA (cerebrovascular accident) on plavix. Statin,  residual of  poor left peripheral vision   Acquired hypothyroidism on Levothyroxine, TSH 1.80 12/02/20  Essential hypertension blood pressure is controlled on Losartan qd. Followed by Cardiology. CTA negative 09/01/20. Bun/creat 9/0.85 06/07/21  Hiatal hernia Stable, on Omeprazole, FDgard helped,  saw GI, EGD 12/26/19. Vit B12 383 12/02/20, Hgb 11.8 06/07/21  Lactose intolerance diet adjustment, f/u GI  Dysphagia Esophageal dysmotility, Swallow studay 05/12/20, worked with ST  OSA (obstructive sleep apnea) sleep study, saw Pulmonology. Refuse using CPAP  Back pain with left-sided sciatica Ambulates independently, saw Ortho, Cervical spinal stenosis, MR 02/26/20, C6-C7  Hyperlipidemia  takes Pravastatin, LDL 94 05/12/21  Incontinent of urine Urinary frequency/leakage, 2-3x/night, stopped taking Gemtesa, acupuncture improved initially. Saw urology.    Osteopenia after menopause Osteopenia, DEXA 12/25/20, t score -1.0,  10 years possibility major fx 11%, hip fx 2.6%. Vit D, Cal-diet rich,  Vit D 3 39 12/26/20  HOH (hard of hearing)  audiology eval, ENT placed tube in the right ear, hearing aids   Labs/tests ordered:  CT head c/o CM  Next appt:  11/12/2021

## 2021-08-20 NOTE — Assessment & Plan Note (Signed)
diet adjustment, f/u GI

## 2021-08-20 NOTE — Assessment & Plan Note (Signed)
Stable, on Omeprazole, FDgard helped,  saw GI, EGD 12/26/19. Vit B12 383 12/02/20, Hgb 11.8 06/07/21

## 2021-08-20 NOTE — Assessment & Plan Note (Signed)
Failed dc Ambien, Valium

## 2021-08-20 NOTE — Assessment & Plan Note (Signed)
Chronic sore throat: multiple factorials, GERD vs chronic rhinitis

## 2021-08-21 ENCOUNTER — Encounter: Payer: Self-pay | Admitting: Nurse Practitioner

## 2021-08-27 ENCOUNTER — Encounter: Payer: Self-pay | Admitting: Nurse Practitioner

## 2021-08-27 ENCOUNTER — Ambulatory Visit: Payer: Medicare HMO | Admitting: Nurse Practitioner

## 2021-08-27 DIAGNOSIS — M8580 Other specified disorders of bone density and structure, unspecified site: Secondary | ICD-10-CM

## 2021-08-27 DIAGNOSIS — I10 Essential (primary) hypertension: Secondary | ICD-10-CM

## 2021-08-27 DIAGNOSIS — E739 Lactose intolerance, unspecified: Secondary | ICD-10-CM

## 2021-08-27 DIAGNOSIS — J312 Chronic pharyngitis: Secondary | ICD-10-CM

## 2021-08-27 DIAGNOSIS — R040 Epistaxis: Secondary | ICD-10-CM

## 2021-08-27 DIAGNOSIS — J329 Chronic sinusitis, unspecified: Secondary | ICD-10-CM

## 2021-08-27 DIAGNOSIS — K219 Gastro-esophageal reflux disease without esophagitis: Secondary | ICD-10-CM

## 2021-08-27 DIAGNOSIS — Z8673 Personal history of transient ischemic attack (TIA), and cerebral infarction without residual deficits: Secondary | ICD-10-CM

## 2021-08-27 DIAGNOSIS — Z780 Asymptomatic menopausal state: Secondary | ICD-10-CM

## 2021-08-27 DIAGNOSIS — R1319 Other dysphagia: Secondary | ICD-10-CM

## 2021-08-27 DIAGNOSIS — G4733 Obstructive sleep apnea (adult) (pediatric): Secondary | ICD-10-CM

## 2021-08-27 DIAGNOSIS — R32 Unspecified urinary incontinence: Secondary | ICD-10-CM

## 2021-08-27 DIAGNOSIS — E039 Hypothyroidism, unspecified: Secondary | ICD-10-CM

## 2021-08-27 DIAGNOSIS — F5101 Primary insomnia: Secondary | ICD-10-CM

## 2021-08-27 DIAGNOSIS — R053 Chronic cough: Secondary | ICD-10-CM | POA: Diagnosis not present

## 2021-08-27 DIAGNOSIS — M5432 Sciatica, left side: Secondary | ICD-10-CM

## 2021-08-27 DIAGNOSIS — E782 Mixed hyperlipidemia: Secondary | ICD-10-CM

## 2021-08-27 NOTE — Assessment & Plan Note (Signed)
Lactose intolerance, diet adjustment, f/u GI 

## 2021-08-27 NOTE — Assessment & Plan Note (Signed)
Managing with  Ambien, Valium

## 2021-08-27 NOTE — Assessment & Plan Note (Signed)
blood pressure is controlled on Losartan qd. Followed by Cardiology. CTA negative 09/01/20. Bun/creat 9/0.85 06/07/21

## 2021-08-27 NOTE — Assessment & Plan Note (Signed)
on Levothyroxine, TSH 1.80 12/02/20 

## 2021-08-27 NOTE — Assessment & Plan Note (Signed)
stable Omeprazole, FDgard helped,  saw GI, EGD 12/26/19. Vit B12 383 12/02/20, Hgb 11.8 06/07/21

## 2021-08-27 NOTE — Assessment & Plan Note (Signed)
saw Ortho. Cervical spinal stenosis, MR 02/26/20, C6-C7 

## 2021-08-27 NOTE — Assessment & Plan Note (Signed)
Chronic sore throat: multiple factorials, GERD vs chronic rhinitis

## 2021-08-27 NOTE — Progress Notes (Signed)
Location:   clinic [facility]   Place of Service:   clinic [place] Provider: Marlana Latus NP  Code Status: DNR Goals of Care: [place]    08/20/2021    9:32 AM  Advanced Directives  Does Patient Have a Medical Advance Directive? Yes  Type of Paramedic of [place];Living will  Does patient want to make changes to medical advance directive? No - Patient declined  Copy of [place] in Chart? Yes - validated most recent copy scanned in chart (See row information)     Chief Complaint  Patient presents with   Acute Visit    Medication questions/bloody nose/post nasal drip/ sore throat    HPI: Patient is a 88 y.o. female seen today for new eye drops made her nose bleed, uncomfortable in her face.    Sleeps too much during day, failed dc Ambien, Valium, refused CPAP, feels a veil in front of left eye, more noticeable left facial weakness. pending CT head c/o CM Hx of chronic rhinitis, SOB, wheezing,  on Astelin, Flonase, Bronchodilator, underwent Pulmonology, ENT, GI, Allergy evaluation, had spirometry 10/20/20, Echo 60-65% EF 04/16/20, CTA 09/01/20                    Chronic sore throat: multiple factorials, GERD vs chronic rhinitis Insomnia Ambien, Valium             Hx of CVA, on plavix. Statin,  residual of  poor left peripheral vision  Hypothyroidism, on Levothyroxine, TSH 1.80 12/02/20             HTN, blood pressure is controlled on Losartan qd. Followed by Cardiology. CTA negative 09/01/20. Bun/creat 9/0.85 06/07/21            GERD, stable Omeprazole, FDgard helped,  saw GI, EGD 12/26/19. Vit B12 383 12/02/20, Hgb 11.8 06/07/21             Lactose intolerance, diet adjustment, f/u GI             Esophageal dysmotility, Swallow studay 05/12/20, worked with ST             OSA sleep study, saw Pulmonology. Refuse using CPAP             OA, saw Ortho. Cervical spinal stenosis, MR 02/26/20, C6-C7             Hyperlipidemia, takes Pravastatin, LDL 94 05/12/21              Urinary frequency/leakage, 2-3x/night, stopped taking Gemtesa, acupuncture improved initially. Saw urology.              Osteopenia, DEXA 12/25/20, t score -1.0,  10 years possibility major fx 11%, hip fx 2.6%. Vit D, Cal-diet rich,  Vit D 3 39 12/26/20             HOH audiology eval, ENT placed tube in the right ear, hearing aids     Past Medical History:  Diagnosis Date   Anxiety    Atypical mole 11/11/2020   Left Inner Knee (moderate-free)   Colon polyps    Depression    Hyperlipidemia    Hypertension    Hypothyroidism    Melanoma in situ ([place]) 09/19/2018   Right Arm (excision)   Skin cancer    Sleep apnea    Stroke [facility]) 2008    Past Surgical History:  Procedure Laterality Date   [place]  CATARACT EXTRACTION Left    COLONOSCOPY     GUM SURGERY  03/2019   LUMBAR DISC SURGERY  2014   MELANOMA EXCISION  2020   MESH APPLIED TO LAP PORT     TONSILLECTOMY  1940    Allergies  Allergen Reactions   Lactose Intolerance (Gi) Other (See Comments)    indigestion   Norco [Hydrocodone-Acetaminophen] Nausea And Vomiting   Dust Mite Extract Other (See Comments)    Sinus drainage   Oxycontin [Oxycodone Hcl] Nausea And Vomiting   Pollen Extract Other (See Comments)    Sinus drainage    Allergies as of 08/27/2021       Reactions   Lactose Intolerance (gi) Other (See Comments)   indigestion   Norco [hydrocodone-acetaminophen] Nausea And Vomiting   Dust Mite Extract Other (See Comments)   Sinus drainage   Oxycontin [oxycodone Hcl] Nausea And Vomiting   Pollen Extract Other (See Comments)   Sinus drainage        Medication List        Accurate as of August 27, 2021 11:59 PM. If you have any questions, ask your nurse or doctor.          acetaminophen 325 MG tablet Commonly known as: TYLENOL Take 650 mg by mouth every 6 (six) hours as needed for headache (pain).   azelastine 0.1 % nasal spray Commonly known as: ASTELIN Place 1 to  2 sprays each nostril 1-2 times a day as needed for runny nose/drainage down throat   Cepacol 15-2.3 MG Lozg Generic drug: Benzocaine-Menthol Use as directed 1 lozenge in the mouth or throat 3 (three) times daily as needed.   clopidogrel 75 MG tablet Commonly known as: PLAVIX TAKE 1 TABLET BY MOUTH EVERY DAY   diazepam 5 MG tablet Commonly known as: VALIUM Take 0.5 tablets (2.5 mg total) by mouth as needed (anxiety). WILL NEED APPOINTMENT FOR ADDITIONAL REFILLS   FDgard 25-20.75 MG Caps Generic drug: [place] Oil-Levomenthol Take one capsule by mouth twice daily.   fluticasone 50 MCG/ACT nasal spray Commonly known as: FLONASE Place 1-2 sprays into both nostrils daily.   Gemtesa 75 MG Tabs Generic drug: Vibegron Take 75 mg by mouth at bedtime.   ketoconazole 2 % shampoo Commonly known as: NIZORAL APPLY 1 APPLICATION TOPICALLY TWICE WEEKLY AS NEEDED FOR SCALP IRRITATION.   loratadine 10 MG tablet Commonly known as: CLARITIN Take 1 tablet (10 mg total) by mouth daily as needed for up to 15 days for allergies.   losartan 50 MG tablet Commonly known as: COZAAR Take 50 mg by mouth at bedtime.   nitroGLYCERIN 0.4 MG SL tablet Commonly known as: NITROSTAT Place 1 tablet (0.4 mg total) under the tongue every 5 (five) minutes as needed for up to 25 days for chest pain.   omeprazole 40 MG capsule Commonly known as: PRILOSEC Take 40 mg by mouth daily with lunch.   pravastatin 40 MG tablet Commonly known as: PRAVACHOL TAKE 1 TABLET BY MOUTH EVERYDAY AT BEDTIME   Synthroid 100 MCG tablet Generic drug: levothyroxine TAKE 1 TABLET BY MOUTH EVERY DAY BEFORE BREAKFAST   Systane 0.4-0.3 % Soln Generic drug: Polyethyl Glycol-Propyl Glycol Place 1 drop into both eyes every morning.   Systane 0.4-0.3 % Gel ophthalmic gel Generic drug: Polyethyl Glycol-Propyl Glycol Place 1 application. into both eyes at bedtime.   Zolpidem Tartrate 3.5 MG Subl Take 0.5 tablets by mouth at  bedtime as needed.        Review of Systems:  Review of Systems  Constitutional:  Negative for fatigue, fever and unexpected weight change.  HENT:  Positive for congestion, hearing loss, nosebleeds, sinus pressure and sore throat. Negative for voice change.        Chronic sinusitis, underwent ENT, sleep study-sleep apnea.   Eyes:  Positive for visual disturbance.       Left lateral visual field deficit. Dry eyes  Respiratory:  Negative for cough and shortness of breath.   Cardiovascular:  Negative for leg swelling.  Gastrointestinal:  Negative for abdominal pain and constipation.  Genitourinary:  Positive for frequency. Negative for dysuria and urgency.       Urinary leakage. Doing Kegel exercise.   Musculoskeletal:  Negative for back pain and gait problem.  Skin:  Negative for color change.  Neurological:  Negative for speech difficulty, weakness and headaches.  Psychiatric/Behavioral:  Positive for sleep disturbance. Negative for dysphoric mood. The patient is not nervous/anxious.        Early am awake, difficulty returning asleep.     Health Maintenance  Topic Date Due   Zoster Vaccines- Shingrix (2 of 2) 02/14/2019   COVID-19 Vaccine (4 - Moderna risk series) 07/31/2020   INFLUENZA VACCINE  08/18/2021   TETANUS/TDAP  04/03/2022 (Originally 11/26/2018)   COLONOSCOPY (Pts 45-14yr Insurance coverage will need to be confirmed)  10/25/2021   Pneumonia Vaccine 88 Years old  Completed   DEXA SCAN  Completed   HPV VACCINES  Aged Out    Physical Exam: Vitals:   08/27/21 1420  BP: 120/70  Pulse: 86  Resp: 18  Temp: (!) 97.3 F (36.3 C)  SpO2: 94%  Weight: 156 lb (70.8 kg)  Height: '5\' 2"'$  (1.575 m)   Body mass index is 28.53 kg/m. Physical Exam Vitals and nursing note reviewed.  Constitutional:      Appearance: Normal appearance.  HENT:     Head: Normocephalic and atraumatic.     Nose: Nose normal.     Comments: A small open area lateral right nostril, no active  bleeding.     Mouth/Throat:     Mouth: Mucous membranes are moist.     Pharynx: No oropharyngeal exudate or posterior oropharyngeal erythema.  Eyes:     Extraocular Movements: Extraocular movements intact.     Conjunctiva/sclera: Conjunctivae normal.     Pupils: Pupils are equal, round, and reactive to light.     Comments: Left peripheral visual field deficit since CVA 2006  Cardiovascular:     Rate and Rhythm: Normal rate and regular rhythm.     Heart sounds: No murmur heard. Pulmonary:     Effort: Pulmonary effort is normal.     Breath sounds: Rales present.     Comments: Bibasilar rales.  Abdominal:     General: Bowel sounds are normal.     Palpations: Abdomen is soft.     Tenderness: There is no abdominal tenderness.  Musculoskeletal:     Cervical back: Normal range of motion and neck supple.     Right lower leg: No edema.     Left lower leg: No edema.  Skin:    General: Skin is warm and dry.  Neurological:     General: No focal deficit present.     Mental Status: She is alert and oriented to person, place, and time. Mental status is at baseline.     Motor: Weakness present.     Gait: Gait normal.     Comments: Left facial weakness  Psychiatric:  Mood and Affect: Mood normal.        Behavior: Behavior normal.        Thought Content: Thought content normal.        Judgment: Judgment normal.     Labs reviewed: Basic Metabolic Panel: Recent Labs    12/02/20 0735 05/12/21 1153 06/06/21 0800 06/07/21 0408  NA  --  144 142 138  K  --  4.5 4.0 3.5  CL  --  106 109 107  CO2  --  '24 24 26  '$ GLUCOSE  --  79 161* 110*  BUN  --  '14 9 9  '$ CREATININE  --  0.79 0.87 0.85  CALCIUM  --  9.9 9.3 8.3*  TSH 1.80  --   --   --    Liver Function Tests: Recent Labs    06/06/21 0800 06/07/21 0408  AST 20 18  ALT 15 14  ALKPHOS 64 50  BILITOT 0.8 0.7  PROT 6.5 5.3*  ALBUMIN 3.8 2.9*   Recent Labs    06/06/21 0800  LIPASE 33   No results for input(s):  "AMMONIA" in the last 8760 hours. CBC: Recent Labs    06/06/21 0800 06/07/21 0408  WBC 11.1* 6.5  NEUTROABS 9.6*  --   HGB 13.9 11.8*  HCT 42.7 35.5*  MCV 92.0 89.9  PLT 229 185   Lipid Panel: Recent Labs    05/12/21 1151  CHOL 163  HDL 44  LDLCALC 94  TRIG 144   Lab Results  Component Value Date   HGBA1C 6.1 (H) 06/07/2021    Procedures since last visit: No results found.  Assessment/Plan  History of CVA (cerebrovascular accident) Sleeps too much during day, failed dc Ambien, Valium, refused CPAP, feels a veil in front of left eye, more noticeable left facial weakness. pending CT head c/o CM Hx of CVA, on plavix. Statin,  residual of  poor left peripheral vision   Chronic cough Hx of chronic rhinitis, SOB, wheezing,  on Astelin, Flonase, Bronchodilator, underwent Pulmonology, ENT, GI, Allergy evaluation, had spirometry 10/20/20, Echo 60-65% EF 04/16/20, CTA 09/01/20      Chronic sore throat  Chronic sore throat: multiple factorials, GERD vs chronic rhinitis  Insomnia Managing with  Ambien, Valium  Acquired hypothyroidism on Levothyroxine, TSH 1.80 12/02/20  Essential hypertension blood pressure is controlled on Losartan qd. Followed by Cardiology. CTA negative 09/01/20. Bun/creat 9/0.85 06/07/21  Gastroesophageal reflux disease stable Omeprazole, FDgard helped,  saw GI, EGD 12/26/19. Vit B12 383 12/02/20, Hgb 11.8 06/07/21  Lactose intolerance Lactose intolerance, diet adjustment, f/u GI  Dysphagia Esophageal dysmotility, Swallow studay 05/12/20, worked with ST  OSA (obstructive sleep apnea)  OSA sleep study, saw Pulmonology. Refuse using CPAP  Back pain with left-sided sciatica saw Ortho. Cervical spinal stenosis, MR 02/26/20, C6-C7  Hyperlipidemia  takes Pravastatin, LDL 94 05/12/21  Incontinent of urine Urinary frequency/leakage, 2-3x/night, stopped taking Gemtesa, acupuncture improved initially. Saw urology.   Osteopenia after menopause Osteopenia,  DEXA 12/25/20, t score -1.0,  10 years possibility major fx 11%, hip fx 2.6%. Vit D, Cal-diet rich,  Vit D 3 39 12/26/20  Right-sided epistaxis Caused by a new nasal spray Varenicline prescribed by Ophthalmology for dry eye, noted a small open area lateral nostril wall, no active bleeding, will use saline nasal spray, prn ABT oint. Observe.   Chronic sinusitis Sinus specialist will do dilation, ablation   Labs/tests ordered:  none Next appt:  09/03/2021

## 2021-08-27 NOTE — Assessment & Plan Note (Signed)
Urinary frequency/leakage, 2-3x/night, stopped taking Gemtesa, acupuncture improved initially. Saw urology.  

## 2021-08-27 NOTE — Assessment & Plan Note (Signed)
takes Pravastatin, LDL 94 05/12/21 

## 2021-08-27 NOTE — Assessment & Plan Note (Signed)
Hx of chronic rhinitis, SOB, wheezing,  on Astelin, Flonase, Bronchodilator, underwent Pulmonology, ENT, GI, Allergy evaluation, had spirometry 10/20/20, Echo 60-65% EF 04/16/20, CTA 09/01/20

## 2021-08-27 NOTE — Assessment & Plan Note (Signed)
OSA sleep study, saw Pulmonology. Refuse using CPAP

## 2021-08-27 NOTE — Assessment & Plan Note (Signed)
Esophageal dysmotility, Swallow studay 05/12/20, worked with [facility]

## 2021-08-27 NOTE — Assessment & Plan Note (Signed)
Sinus specialist will do dilation, ablation

## 2021-08-27 NOTE — Assessment & Plan Note (Signed)
Caused by a new nasal spray Varenicline prescribed by Ophthalmology for dry eye, noted a small open area lateral nostril wall, no active bleeding, will use saline nasal spray, prn ABT oint. Observe.

## 2021-08-27 NOTE — Assessment & Plan Note (Signed)
Sleeps too much during day, failed dc Ambien, Valium, refused CPAP, feels a veil in front of left eye, more noticeable left facial weakness. pending CT head c/o CM Hx of CVA, on plavix. Statin,  residual of  poor left peripheral vision

## 2021-08-27 NOTE — Assessment & Plan Note (Signed)
Osteopenia, DEXA 12/25/20, t score -1.0,  10 years possibility major fx 11%, hip fx 2.6%. Vit D, Cal-diet rich,  Vit D 3 39 12/26/20 

## 2021-08-31 ENCOUNTER — Encounter: Payer: Self-pay | Admitting: Nurse Practitioner

## 2021-09-01 NOTE — Progress Notes (Unsigned)
09/02/2021 Bracha Frankowski [id number] [birth year]  Referring provider: NP. Man [name] Primary GI doctor: Dr. Rush Landmark  ASSESSMENT AND PLAN:  Assessment/plan 88 y.o. female here for assessment of the following: 1. Chronic sore throat   2. Gastroesophageal reflux disease without esophagitis   3. Cough, unspecified type   4. Abdominal bloating   5. History of colonic polyps   6. History of CVA (cerebrovascular accident)    Intermittent sore throat, has sinus pressure/issues, going to see ENT today.  Either from sinus drainage or possible esophagitis. She has history of GERD with esophagitis, last EGD 2020 showed grade C and had to have repeat EGD 2021 that showed improvement.  Patient is going to [place] and wants to hold off on EGD, continue to ENT today to see if there is anything they can do to help, will increase PPI to BID and GERD discussed.  Patient is 88, on plavix and higher risk for endoscopic procedures, has 2 month follow up but will call back sooner with results from ENT.   AB bloating, improved with FODMAP diet and lactose avoidance.  Meds ordered this encounter  Medications   sucralfate (CARAFATE) 1 g tablet    Sig: Take 1 tablet (1 g total) by mouth 3 (three) times daily before meals.    Dispense:  40 tablet    Refill:  0   omeprazole (PRILOSEC) 40 MG capsule    Sig: Take 1 capsule (40 mg total) by mouth 2 (two) times daily before lunch and supper.    Dispense:  180 capsule    Refill:  3       History of Present Illness:  88 y.o. female  with a past medical history of CVA (on Plavix), atrial fibrillation, anxiety/depression, sleep apnea, colon polyps, status post appendectomy, chronic sore throat, esophagitis, hiatal hernia, esophageal dysmotility, diverticulosis, hemorrhoids, colonic AVM, adenomatous colon polyps 10/2018 and others listed below, returns to clinic today for evaluation of chronic sinusitis/GERD.  05/08/2021 patient seen in the office for  abdominal bloating and diarrhea thought to be due to lactose has improved since stopping. Given FODMAP diet. Hospital admission 05/20-05/22 for chest discomfort and non productive cough, found to have acute hypoxic respiratory failure positive for rhinovirus and sepsis. Had chronic sinus disease, chronic sore throat/cough. Discharged on augmentin and prednisone.  Has OV with ENT this afternoon with [facility].  Repeat CXR 06/06/2021 unremarkable.   She is having reflux/GERD, she is unable to take omeprazole until middle of the day before lunch due to taking her thyroid medication in the morning. She has GERD daily, occ bad/occ not, can feel burning into her throat and can wake her in the middle of the night.  She has dysphagia 3-4 times a week, can be with liquids or solids.  Sometimes can have painful swallowing.  Tums can help the reflux.   Sore throat comes and goes, not worse after food/in the AM.She does have sinus pressure, has fullness in her ears.   She has periodic cough/wheezing, occ with food but can be with sitting. She denies dyspnea or chest pain, she walks a mile daily. She states she eats at odd times, does not eat at the same time. Does not eat lunch until 1 -130, eats dinner at 7 or 7 15, goes to bad at 11 pm.  No history of smoking.  Denies NSAIDS, ETOH ( will hurt her throat).  04/2020 barium swallow marked dysmotility 2021 EGD relook after treatment with empiric dilatation due to dysphagia  with mild improvement. 10/26/2018 endoscopy for reflux showed LA grade C esophagitis, 4 cm hiatal hernia multiple gastric polyps 10/26/2018 colonoscopy nonbleeding nonthrombosed external and internal hemorrhoids, diverticulosis diffuse, 5 (2 adenomatous and 3 sessile serrated) polyps 2-13 millimeters in size, single colonic angiectasia, normally would recommend colonoscopy 3 years  Current Medications:   Current Outpatient Medications (Endocrine & Metabolic):    SYNTHROID 856 MCG tablet,  TAKE 1 TABLET BY MOUTH EVERY DAY BEFORE BREAKFAST  Current Outpatient Medications (Cardiovascular):    losartan (COZAAR) 50 MG tablet, Take 50 mg by mouth at bedtime.   nitroGLYCERIN (NITROSTAT) 0.4 MG SL tablet, Place 1 tablet (0.4 mg total) under the tongue every 5 (five) minutes as needed for up to 25 days for chest pain.   pravastatin (PRAVACHOL) 40 MG tablet, TAKE 1 TABLET BY MOUTH EVERYDAY AT BEDTIME  Current Outpatient Medications (Respiratory):    azelastine (ASTELIN) 0.1 % nasal spray, Place 1 to 2 sprays each nostril 1-2 times a day as needed for runny nose/drainage down throat   fluticasone (FLONASE) 50 MCG/ACT nasal spray, Place 1-2 sprays into both nostrils daily.   loratadine (CLARITIN) 10 MG tablet, Take 1 tablet (10 mg total) by mouth daily as needed for up to 15 days for allergies.  Current Outpatient Medications (Analgesics):    acetaminophen (TYLENOL) 325 MG tablet, Take 650 mg by mouth every 6 (six) hours as needed for headache (pain).  Current Outpatient Medications (Hematological):    clopidogrel (PLAVIX) 75 MG tablet, TAKE 1 TABLET BY MOUTH EVERY DAY  Current Outpatient Medications (Other):    Benzocaine-Menthol (CEPACOL) 15-2.3 MG LOZG, Use as directed 1 lozenge in the mouth or throat 3 (three) times daily as needed.   [place] Oil-Levomenthol (FDGARD) 25-20.75 MG CAPS, Take one capsule by mouth twice daily.   diazepam (VALIUM) 5 MG tablet, Take 0.5 tablets (2.5 mg total) by mouth as needed (anxiety). WILL NEED APPOINTMENT FOR ADDITIONAL REFILLS   ketoconazole (NIZORAL) 2 % shampoo, APPLY 1 APPLICATION TOPICALLY TWICE WEEKLY AS NEEDED FOR SCALP IRRITATION.   Polyethyl Glycol-Propyl Glycol (SYSTANE) 0.4-0.3 % GEL ophthalmic gel, Place 1 application. into both eyes at bedtime.   Polyethyl Glycol-Propyl Glycol (SYSTANE) 0.4-0.3 % SOLN, Place 1 drop into both eyes every morning.   sucralfate (CARAFATE) 1 g tablet, Take 1 tablet (1 g total) by mouth 3 (three) times daily  before meals.   Varenicline Tartrate (TYRVAYA) 0.03 MG/ACT SOLN, Place 1 spray into the nose 2 (two) times daily.   Vibegron (GEMTESA) 75 MG TABS, Take 75 mg by mouth at bedtime.   Zolpidem Tartrate 3.5 MG SUBL, Take 0.5 tablets by mouth at bedtime as needed.   omeprazole (PRILOSEC) 40 MG capsule, Take 1 capsule (40 mg total) by mouth 2 (two) times daily before lunch and supper.  Surgical History:  She  has a past surgical history that includes Lumbar disc surgery (2014); Appendectomy (1960); Tonsillectomy (1940); Adenoidectomy; Mesh applied to lap port; Colonoscopy; Gum surgery (03/2019); Melanoma excision (2020); and Cataract extraction (Left). Family History:  Her family history includes Allergic rhinitis in her sister; Bipolar disorder in her daughter; CVA in her brother; Heart attack in her father and paternal grandfather; Heart disease in her mother; Hypertension in her sister; Lung cancer in her maternal grandfather; Lung cancer (age of onset: 23) in her mother; Melanoma in her daughter; Psoriasis in her daughter; Rheum arthritis in her daughter; Stroke (age of onset: 28) in her father. Social History:   reports that she has never smoked.  She has never used smokeless tobacco. She reports that she does not currently use alcohol. She reports that she does not use drugs.  Current Medications, Allergies, Past Medical History, Past Surgical History, Family History and Social History were reviewed in [employer] record.  Physical Exam: BP (!) 140/80 (BP Location: Right Arm, Patient Position: Sitting, Cuff Size: Normal)   Pulse 80   Resp 18   Ht 5' 2.5" (1.588 m)   Wt 156 lb 9.6 oz (71 kg)   SpO2 96%   BMI 28.19 kg/m  General:   Pleasant, well developed female in no acute distress, HOH Heart:  Regular rate and rhythm; no murmurs Pulm: Clear anteriorly; no wheezing Sinus: has some tenderness to palpation bilateral maxillary sinuses, minor erythema on post pharynx, no  exudate.  Abdomen:  Soft, Obese AB, Active bowel sounds. No tenderness . , No organomegaly appreciated. Extremities:  without  edema. Neurologic:  Alert and  oriented x4;  No focal deficits.  Psych:  Cooperative. Normal mood and affect.   Vladimir Crofts, PA-C 09/02/21

## 2021-09-02 ENCOUNTER — Ambulatory Visit: Payer: Medicare HMO | Admitting: Physician Assistant

## 2021-09-02 ENCOUNTER — Encounter: Payer: Self-pay | Admitting: Physician Assistant

## 2021-09-02 VITALS — BP 140/80 | HR 80 | Resp 18 | Ht 62.5 in | Wt 156.6 lb

## 2021-09-02 DIAGNOSIS — J312 Chronic pharyngitis: Secondary | ICD-10-CM | POA: Diagnosis not present

## 2021-09-02 DIAGNOSIS — Z86010 Personal history of colonic polyps: Secondary | ICD-10-CM

## 2021-09-02 DIAGNOSIS — K219 Gastro-esophageal reflux disease without esophagitis: Secondary | ICD-10-CM

## 2021-09-02 DIAGNOSIS — R059 Cough, unspecified: Secondary | ICD-10-CM

## 2021-09-02 DIAGNOSIS — R140 Abdominal distension (gaseous): Secondary | ICD-10-CM | POA: Diagnosis not present

## 2021-09-02 DIAGNOSIS — Z8673 Personal history of transient ischemic attack (TIA), and cerebral infarction without residual deficits: Secondary | ICD-10-CM

## 2021-09-02 MED ORDER — OMEPRAZOLE 40 MG PO CPDR: 40.0000 mg | DELAYED_RELEASE_CAPSULE | Freq: Two times a day (BID) | ORAL | 3 refills | Status: DC

## 2021-09-02 MED ORDER — SUCRALFATE 1 G PO TABS: 1.0000 g | ORAL_TABLET | Freq: Three times a day (TID) | ORAL | 0 refills | Status: DC

## 2021-09-02 NOTE — Patient Instructions (Addendum)
If you are age 86 or older, your body mass index should be between 23-30. Your Body mass index is 28.19 kg/m. If this is out of the aforementioned range listed, please consider follow up with your Primary Care Provider. ________________________________________________________  The [place] [facility] providers would like to encourage you to use [facility] to communicate with providers for non-urgent requests or questions.  Due to long hold times on the telephone, sending your provider a message by [facility] may be a faster and more efficient way to get a response.  Please allow 48 business hours for a response.  Please remember that this is for non-urgent requests.  _______________________________________________________  Darlene [name] are scheduled to follow up with Dr Rush Landmark on 11-06-21 at 1130am.  Please see if there is any explanation for your sore throat with ENT, if not we will plan on scheduling an upper endoscopy to evaluate.  Please take your proton pump inhibitor medication, omeprazole TWICE A DAY  Please take this medication 30 minutes to 1 hour before meals- this makes it more effective.   Can take carafate for up to 5 days, do not take near your thyroid medication. Can mix with liquid and drink it  Take about 30 mins to 1 hour before food and before bed.  This coats your stomach.   Avoid spicy and acidic foods Avoid fatty foods Limit your intake of coffee, tea, alcohol, and carbonated drinks Work to maintain a healthy weight Keep the head of the bed elevated at least 3 inches with blocks or a wedge pillow if you are having any nighttime symptoms Stay upright for 2 hours after eating Avoid meals and snacks three to four hours before bedtime   Silent reflux: Not all heartburn burns...Marland KitchenMarland KitchenMarland Kitchen  What is LPR? Laryngopharyngeal reflux (LPR) or silent reflux is a condition in which acid that is made in the stomach travels up the esophagus (swallowing tube) and gets to the throat. Not everyone with reflux  has a lot of heartburn or indigestion. In fact, many people with LPR never have heartburn. This is why LPR is called SILENT REFLUX, and the terms "Silent reflux" and "LPR" are often used interchangeably. Because LPR is silent, it is sometimes difficult to diagnose.  How can you tell if you have LPR?  Chronic hoarseness- Some people have hoarseness that comes and goes throat clearing  Cough It can cause shortness of breath and cause asthma like symptoms. a feeling of a lump in the throat  difficulty swallowing a problem with too much nose and throat drainage.  Some people will feel their esophagus spasm which feels like their heart beating hard and fast, this will usually be after a meal, at rest, or lying down at night.    How do I treat this? Treatment for LPR should be individualized, and your doctor will suggest the best treatment for you. Generally there are several treatments for LPR: changing habits and diet to reduce reflux,  medications to reduce stomach acid, and  surgery to prevent reflux. Most people with LPR need to modify how and when they eat, as well as take some medication, to get well. Sometimes, nonprescription liquid antacids, such as Maalox, Gelucil and Mylanta are recommended. When used, these antacids should be taken four times each day - one tablespoon one hour after each meal and before bedtime. Dietary and lifestyle changes alone are not often enough to control LPR - medications that reduce stomach acid are also usually needed. These must be prescribed by our doctor.  TIPS FOR REDUCING REFLUX AND LPR Control your LIFE-STYLE and your DIET! If you use tobacco, QUIT.  Smoking makes you reflux. After every cigarette you have some LPR.  Don't wear clothing that is too tight, especially around the waist (trousers, corsets, belts).  Do not lie down just after eating...in fact, do not eat within three hours of bedtime.  You should be on a low-fat diet.  Limit your  intake of red meat.  Limit your intake of butter.  Avoid fried foods.  Avoid chocolate  Avoid cheese.  Avoid eggs. Specifically avoid caffeine (especially coffee and tea), soda pop (especially cola) and mints.  Avoid alcoholic beverages, particularly in the evening.   Thank you for entrusting me with your care and choosing [facility].  Vicie Mutters, PA-C

## 2021-09-03 ENCOUNTER — Encounter: Payer: Self-pay | Admitting: Nurse Practitioner

## 2021-09-03 ENCOUNTER — Non-Acute Institutional Stay: Payer: Medicare HMO | Admitting: Nurse Practitioner

## 2021-09-03 DIAGNOSIS — I10 Essential (primary) hypertension: Secondary | ICD-10-CM

## 2021-09-03 DIAGNOSIS — R32 Unspecified urinary incontinence: Secondary | ICD-10-CM

## 2021-09-03 DIAGNOSIS — E782 Mixed hyperlipidemia: Secondary | ICD-10-CM

## 2021-09-03 DIAGNOSIS — K449 Diaphragmatic hernia without obstruction or gangrene: Secondary | ICD-10-CM

## 2021-09-03 DIAGNOSIS — R1319 Other dysphagia: Secondary | ICD-10-CM

## 2021-09-03 DIAGNOSIS — Z8673 Personal history of transient ischemic attack (TIA), and cerebral infarction without residual deficits: Secondary | ICD-10-CM

## 2021-09-03 DIAGNOSIS — F5101 Primary insomnia: Secondary | ICD-10-CM

## 2021-09-03 DIAGNOSIS — R400 Somnolence: Secondary | ICD-10-CM

## 2021-09-03 DIAGNOSIS — H9190 Unspecified hearing loss, unspecified ear: Secondary | ICD-10-CM

## 2021-09-03 DIAGNOSIS — E039 Hypothyroidism, unspecified: Secondary | ICD-10-CM

## 2021-09-03 DIAGNOSIS — J329 Chronic sinusitis, unspecified: Secondary | ICD-10-CM | POA: Diagnosis not present

## 2021-09-03 DIAGNOSIS — E739 Lactose intolerance, unspecified: Secondary | ICD-10-CM

## 2021-09-03 DIAGNOSIS — M5432 Sciatica, left side: Secondary | ICD-10-CM

## 2021-09-03 DIAGNOSIS — R040 Epistaxis: Secondary | ICD-10-CM

## 2021-09-03 DIAGNOSIS — M8580 Other specified disorders of bone density and structure, unspecified site: Secondary | ICD-10-CM

## 2021-09-03 DIAGNOSIS — J312 Chronic pharyngitis: Secondary | ICD-10-CM | POA: Diagnosis not present

## 2021-09-03 DIAGNOSIS — Z780 Asymptomatic menopausal state: Secondary | ICD-10-CM

## 2021-09-03 DIAGNOSIS — G4733 Obstructive sleep apnea (adult) (pediatric): Secondary | ICD-10-CM

## 2021-09-03 NOTE — Assessment & Plan Note (Signed)
frequency/leakage, 2-3x/night, stopped taking Gemtesa, acupuncture improved initially. Saw urology.

## 2021-09-03 NOTE — Assessment & Plan Note (Signed)
blood pressure is controlled on Losartan qd. Followed by Cardiology. CTA negative 09/01/20. Bun/creat 9/0.85 06/07/21

## 2021-09-03 NOTE — Assessment & Plan Note (Addendum)
stable Omeprazole, FDgard helped,  saw GI, EGD 12/26/19. Vit B12 383 12/02/20, Hgb 11.8.  06/07/21. Pending repeat endoscope.

## 2021-09-03 NOTE — Assessment & Plan Note (Signed)
saw Ortho. Cervical spinal stenosis, MR 02/26/20, C6-C7 

## 2021-09-03 NOTE — Assessment & Plan Note (Signed)
diet adjustment, f/u GI

## 2021-09-03 NOTE — Assessment & Plan Note (Signed)
DEXA 12/25/20, t score -1.0,  10 years possibility major fx 11%, hip fx 2.6%. Vit D, Cal-diet rich,  Vit D 3 39 12/26/20

## 2021-09-03 NOTE — Assessment & Plan Note (Signed)
Esophageal dysmotility, Swallow studay 05/12/20, worked with [place], GI 8/16/2

## 2021-09-03 NOTE — Assessment & Plan Note (Signed)
on Levothyroxine, TSH 1.80 12/02/20 

## 2021-09-03 NOTE — Assessment & Plan Note (Signed)
Sleeps too much during day, failed dc Ambien, Valium, refused CPAP, feels a veil in front of left eye, more noticeable left facial weakness. pending CT head c/o CM

## 2021-09-03 NOTE — Assessment & Plan Note (Signed)
[facility] audiology eval, ENT placed tube in the right ear, hearing aids

## 2021-09-03 NOTE — Assessment & Plan Note (Addendum)
Hx of chronic rhinitis, SOB, wheezing,  on Astelin, Flonase, Bronchodilator, underwent Pulmonology, ENT, GI, Allergy evaluation, had spirometry 10/20/20, Echo 60-65% EF 04/16/20, CTA 09/01/20. Saw ENT, pending surgery.

## 2021-09-03 NOTE — Assessment & Plan Note (Signed)
takes Pravastatin, LDL 94 05/12/21 

## 2021-09-03 NOTE — Assessment & Plan Note (Signed)
on plavix. Statin,  residual of  poor left peripheral vision

## 2021-09-03 NOTE — Assessment & Plan Note (Addendum)
New eye drops made her nose bleed, only x1

## 2021-09-03 NOTE — Progress Notes (Signed)
Location:   clinic [place]   Place of Service:   clinic [place] Provider: Marlana Latus NP  Code Status: DNR Goals of Care: [place]    08/20/2021    9:32 AM  Advanced Directives  Does Patient Have a Medical Advance Directive? Yes  Type of Paramedic of [place];Living will  Does patient want to make changes to medical advance directive? No - Patient declined  Copy of [place] in Chart? Yes - validated most recent copy scanned in chart (See row information)     Chief Complaint  Patient presents with   Follow-up    Patient is here for follow up for ct scan of head for headaches    HPI: Patient is a 88 y.o. female seen today for medical management of chronic diseases.     New eye drops made her nose bleed, only x1              Sleeps too much during day, failed dc Ambien, Valium, refused CPAP, feels a veil in front of left eye, more noticeable left facial weakness. pending CT head c/o CM Hx of chronic rhinitis, SOB, wheezing,  on Astelin, Flonase, Bronchodilator, underwent Pulmonology, ENT, GI, Allergy evaluation, had spirometry 10/20/20, Echo 60-65% EF 04/16/20, CTA 09/01/20. Saw ENT, pending surgery.              Chronic sore throat: multiple factorials, GERD vs chronic rhinitis, ENT 09/02/21 Insomnia Ambien, Valium             Hx of CVA, on plavix. Statin,  residual of  poor left peripheral vision  Hypothyroidism, on Levothyroxine, TSH 1.80 12/02/20             HTN, blood pressure is controlled on Losartan qd. Followed by Cardiology. CTA negative 09/01/20. Bun/creat 9/0.85 06/07/21            GERD, stable Omeprazole, FDgard helped,  saw GI, EGD 12/26/19. Vit B12 383 12/02/20, Hgb 11.8 06/07/21. GI 09/03/21, pending repeat endoscope.              Lactose intolerance, diet adjustment, f/u GI             Esophageal dysmotility, Swallow studay 05/12/20, worked with [place], GI 09/02/21             OSA sleep study, saw Pulmonology. Refuse using CPAP             OA, saw  Ortho. Cervical spinal stenosis, MR 02/26/20, C6-C7             Hyperlipidemia, takes Pravastatin, LDL 94 05/12/21             Urinary frequency/leakage, 2-3x/night, stopped taking Gemtesa, acupuncture improved initially. Saw urology.              Osteopenia, DEXA 12/25/20, t score -1.0,  10 years possibility major fx 11%, hip fx 2.6%. Vit D, Cal-diet rich,  Vit D 3 39 12/26/20             [facility] audiology eval, ENT placed tube in the right ear, hearing aids      Past Medical History:  Diagnosis Date   Anxiety    Atypical mole 11/11/2020   Left Inner Knee (moderate-free)   Colon polyps    Depression    Hyperlipidemia    Hypertension    Hypothyroidism    Melanoma in situ ([place]) 09/19/2018   Right Arm (excision)   Skin cancer  Sleep apnea    Stroke [facility]) 2008    Past Surgical History:  Procedure Laterality Date   ADENOIDECTOMY     APPENDECTOMY  1960   CATARACT EXTRACTION Left    COLONOSCOPY     GUM SURGERY  03/2019   LUMBAR [place] SURGERY  2014   MELANOMA EXCISION  2020   MESH APPLIED TO LAP PORT     TONSILLECTOMY  1940    Allergies  Allergen Reactions   Lactose Intolerance (Gi) Other (See Comments)    indigestion   Norco [Hydrocodone-Acetaminophen] Nausea And Vomiting   Dust Mite Extract Other (See Comments)    Sinus drainage   Oxycontin [Oxycodone Hcl] Nausea And Vomiting   Pollen Extract Other (See Comments)    Sinus drainage    Allergies as of 09/03/2021       Reactions   Lactose Intolerance (gi) Other (See Comments)   indigestion   Norco [hydrocodone-acetaminophen] Nausea And Vomiting   Dust Mite Extract Other (See Comments)   Sinus drainage   Oxycontin [oxycodone Hcl] Nausea And Vomiting   Pollen Extract Other (See Comments)   Sinus drainage        Medication List        Accurate as of September 03, 2021 11:59 PM. If you have any questions, ask your nurse or doctor.          acetaminophen 325 MG tablet Commonly known as: TYLENOL Take 650 mg by mouth  every 6 (six) hours as needed for headache (pain).   azelastine 0.1 % nasal spray Commonly known as: ASTELIN Place 1 to 2 sprays each nostril 1-2 times a day as needed for runny nose/drainage down throat   Cepacol 15-2.3 MG Lozg Generic drug: Benzocaine-Menthol Use as directed 1 lozenge in the mouth or throat 3 (three) times daily as needed.   clopidogrel 75 MG tablet Commonly known as: PLAVIX TAKE 1 TABLET BY MOUTH EVERY DAY   diazepam 5 MG tablet Commonly known as: VALIUM Take 0.5 tablets (2.5 mg total) by mouth as needed (anxiety). WILL NEED APPOINTMENT FOR ADDITIONAL REFILLS   FDgard 25-20.75 MG Caps Generic drug: [place] Oil-Levomenthol Take one capsule by mouth twice daily.   fluticasone 50 MCG/ACT nasal spray Commonly known as: FLONASE Place 1-2 sprays into both nostrils daily.   Gemtesa 75 MG Tabs Generic drug: Vibegron Take 75 mg by mouth at bedtime.   ketoconazole 2 % shampoo Commonly known as: NIZORAL APPLY 1 APPLICATION TOPICALLY TWICE WEEKLY AS NEEDED FOR SCALP IRRITATION.   loratadine 10 MG tablet Commonly known as: CLARITIN Take 1 tablet (10 mg total) by mouth daily as needed for up to 15 days for allergies.   losartan 50 MG tablet Commonly known as: COZAAR Take 50 mg by mouth at bedtime.   nitroGLYCERIN 0.4 MG SL tablet Commonly known as: NITROSTAT Place 1 tablet (0.4 mg total) under the tongue every 5 (five) minutes as needed for up to 25 days for chest pain.   omeprazole 40 MG capsule Commonly known as: PRILOSEC Take 1 capsule (40 mg total) by mouth 2 (two) times daily before lunch and supper.   pravastatin 40 MG tablet Commonly known as: PRAVACHOL TAKE 1 TABLET BY MOUTH EVERYDAY AT BEDTIME   sucralfate 1 g tablet Commonly known as: CARAFATE Take 1 tablet (1 g total) by mouth 3 (three) times daily before meals.   Synthroid 100 MCG tablet Generic drug: levothyroxine TAKE 1 TABLET BY MOUTH EVERY DAY BEFORE BREAKFAST   Systane 0.4-0.3 %  Soln Generic drug: Polyethyl Glycol-Propyl Glycol Place 1 drop into both eyes every morning.   Systane 0.4-0.3 % Gel ophthalmic gel Generic drug: Polyethyl Glycol-Propyl Glycol Place 1 application. into both eyes at bedtime.   Tyrvaya 0.03 MG/ACT Soln Generic drug: Varenicline Tartrate Place 1 spray into the nose 2 (two) times daily.   Zolpidem Tartrate 3.5 MG Subl Take 0.5 tablets by mouth at bedtime as needed.        Review of Systems:  Review of Systems  Constitutional:  Negative for fatigue, fever and unexpected weight change.  HENT:  Positive for congestion, hearing loss, nosebleeds, sinus pressure and sore throat. Negative for voice change.        Chronic sinusitis, underwent ENT, sleep study-sleep apnea.   Eyes:  Positive for visual disturbance.       Left lateral visual field deficit. Dry eyes  Respiratory:  Negative for cough and shortness of breath.   Cardiovascular:  Negative for leg swelling.  Gastrointestinal:  Negative for abdominal pain and constipation.  Genitourinary:  Positive for frequency. Negative for dysuria and urgency.       Urinary leakage. Doing Kegel exercise.   Musculoskeletal:  Negative for back pain and gait problem.  Skin:  Negative for color change.  Neurological:  Negative for speech difficulty, weakness and headaches.  Psychiatric/Behavioral:  Positive for sleep disturbance. Negative for dysphoric mood. The patient is not nervous/anxious.        Early am awake, difficulty returning asleep.     Health Maintenance  Topic Date Due   Zoster Vaccines- Shingrix (2 of 2) 02/14/2019   COVID-19 Vaccine (4 - Moderna risk series) 07/31/2020   INFLUENZA VACCINE  08/18/2021   TETANUS/TDAP  04/03/2022 (Originally 11/26/2018)   COLONOSCOPY (Pts 45-47yr Insurance coverage will need to be confirmed)  10/25/2021   Pneumonia Vaccine 88 Years old  Completed   DEXA SCAN  Completed   HPV VACCINES  Aged Out    Physical Exam: Vitals:   09/03/21 1518   BP: 118/60  Pulse: 77  Resp: 18  Temp: (!) 97.5 F (36.4 C)  SpO2: 94%  Weight: 156 lb (70.8 kg)  Height: 5' 2.5" (1.588 m)   Body mass index is 28.08 kg/m. Physical Exam Vitals and nursing note reviewed.  Constitutional:      Appearance: Normal appearance.  HENT:     Head: Normocephalic and atraumatic.     Nose: Nose normal.     Mouth/Throat:     Mouth: Mucous membranes are moist.     Pharynx: No oropharyngeal exudate or posterior oropharyngeal erythema.  Eyes:     Extraocular Movements: Extraocular movements intact.     Conjunctiva/sclera: Conjunctivae normal.     Pupils: Pupils are equal, round, and reactive to light.     Comments: Left peripheral visual field deficit since CVA 2006  Cardiovascular:     Rate and Rhythm: Normal rate and regular rhythm.     Heart sounds: No murmur heard. Pulmonary:     Effort: Pulmonary effort is normal.     Breath sounds: Rales present.     Comments: Bibasilar rales.  Abdominal:     General: Bowel sounds are normal.     Palpations: Abdomen is soft.     Tenderness: There is no abdominal tenderness.  Musculoskeletal:     Cervical back: Normal range of motion and neck supple.     Right lower leg: No edema.     Left lower leg: No edema.  Skin:  General: Skin is warm and dry.  Neurological:     General: No focal deficit present.     Mental Status: She is alert and oriented to person, place, and time. Mental status is at baseline.     Motor: Weakness present.     Gait: Gait normal.     Comments: Left facial weakness  Psychiatric:        Mood and Affect: Mood normal.        Behavior: Behavior normal.        Thought Content: Thought content normal.        Judgment: Judgment normal.     Labs reviewed: Basic Metabolic Panel: Recent Labs    12/02/20 0735 05/12/21 1153 06/06/21 0800 06/07/21 0408  NA  --  144 142 138  K  --  4.5 4.0 3.5  CL  --  106 109 107  CO2  --  '24 24 26  '$ GLUCOSE  --  79 161* 110*  BUN  --  '14 9 9   '$ CREATININE  --  0.79 0.87 0.85  CALCIUM  --  9.9 9.3 8.3*  TSH 1.80  --   --   --    Liver Function Tests: Recent Labs    06/06/21 0800 06/07/21 0408  AST 20 18  ALT 15 14  ALKPHOS 64 50  BILITOT 0.8 0.7  PROT 6.5 5.3*  ALBUMIN 3.8 2.9*   Recent Labs    06/06/21 0800  LIPASE 33   No results for input(s): "AMMONIA" in the last 8760 hours. CBC: Recent Labs    06/06/21 0800 06/07/21 0408  WBC 11.1* 6.5  NEUTROABS 9.6*  --   HGB 13.9 11.8*  HCT 42.7 35.5*  MCV 92.0 89.9  PLT 229 185   Lipid Panel: Recent Labs    05/12/21 1151  CHOL 163  HDL 44  LDLCALC 94  TRIG 144   Lab Results  Component Value Date   HGBA1C 6.1 (H) 06/07/2021    Procedures since last visit: No results found.  Assessment/Plan  Right-sided epistaxis New eye drops made her nose bleed, only x1  Daytime sleepiness Sleeps too much during day, failed dc Ambien, Valium, refused CPAP, feels a veil in front of left eye, more noticeable left facial weakness. pending CT head c/o CM  Chronic sinusitis Hx of chronic rhinitis, SOB, wheezing,  on Astelin, Flonase, Bronchodilator, underwent Pulmonology, ENT, GI, Allergy evaluation, had spirometry 10/20/20, Echo 60-65% EF 04/16/20, CTA 09/01/20. Saw ENT, pending surgery.   Chronic sore throat Chronic sore throat: multiple factorials, GERD vs chronic rhinitis, ENT 09/02/21  Insomnia  Ambien, Valium  History of CVA (cerebrovascular accident) on plavix. Statin,  residual of  poor left peripheral vision   Acquired hypothyroidism on Levothyroxine, TSH 1.80 12/02/20  Essential hypertension blood pressure is controlled on Losartan qd. Followed by Cardiology. CTA negative 09/01/20. Bun/creat 9/0.85 06/07/21  Hiatal hernia stable Omeprazole, FDgard helped,  saw GI, EGD 12/26/19. Vit B12 383 12/02/20, Hgb 11.8.  06/07/21. Pending repeat endoscope.  Lactose intolerance diet adjustment, f/u GI  Dysphagia Esophageal dysmotility, Swallow studay 05/12/20,  worked with ST, GI 8/16/2  OSA (obstructive sleep apnea)  sleep study, saw Pulmonology. Refuse using CPAP  Back pain with left-sided sciatica saw Ortho. Cervical spinal stenosis, MR 02/26/20, C6-C7  Hyperlipidemia  takes Pravastatin, LDL 94 05/12/21  Incontinent of urine frequency/leakage, 2-3x/night, stopped taking Gemtesa, acupuncture improved initially. Saw urology.   Osteopenia after menopause DEXA 12/25/20, t score -1.0,  10 years  possibility major fx 11%, hip fx 2.6%. Vit D, Cal-diet rich,  Vit D 3 39 12/26/20  HOH (hard of hearing)  [facility] audiology eval, ENT placed tube in the right ear, hearing aids     Labs/tests ordered:  none  Next appt:  11/12/2021

## 2021-09-03 NOTE — Assessment & Plan Note (Signed)
Chronic sore throat: multiple factorials, GERD vs chronic rhinitis, ENT 09/02/21

## 2021-09-03 NOTE — Assessment & Plan Note (Signed)
sleep study, saw Pulmonology. Refuse using CPAP

## 2021-09-03 NOTE — Assessment & Plan Note (Signed)
Ambien, Valium

## 2021-09-04 ENCOUNTER — Encounter: Payer: Self-pay | Admitting: Nurse Practitioner

## 2021-09-08 ENCOUNTER — Other Ambulatory Visit: Payer: Self-pay

## 2021-09-08 DIAGNOSIS — K219 Gastro-esophageal reflux disease without esophagitis: Secondary | ICD-10-CM

## 2021-09-08 MED ORDER — ZOLPIDEM TARTRATE 3.5 MG SL SUBL: 0.5000 | SUBLINGUAL_TABLET | Freq: Every evening | SUBLINGUAL | 5 refills | Status: DC | PRN

## 2021-09-08 NOTE — Progress Notes (Signed)
Attending Physician's Attestation   I have reviewed the chart.   I agree with the Advanced Practitioner's note, impression, and recommendations with any updates as below.    Saryah Loper Mansouraty, MD [facility] Office # [phone]  

## 2021-09-10 ENCOUNTER — Encounter: Payer: Self-pay | Admitting: Nurse Practitioner

## 2021-09-10 ENCOUNTER — Non-Acute Institutional Stay: Payer: Medicare HMO | Admitting: Nurse Practitioner

## 2021-09-10 DIAGNOSIS — R400 Somnolence: Secondary | ICD-10-CM

## 2021-09-10 DIAGNOSIS — G4733 Obstructive sleep apnea (adult) (pediatric): Secondary | ICD-10-CM

## 2021-09-10 DIAGNOSIS — C4492 Squamous cell carcinoma of skin, unspecified: Secondary | ICD-10-CM | POA: Diagnosis not present

## 2021-09-10 DIAGNOSIS — K219 Gastro-esophageal reflux disease without esophagitis: Secondary | ICD-10-CM

## 2021-09-10 DIAGNOSIS — J329 Chronic sinusitis, unspecified: Secondary | ICD-10-CM

## 2021-09-10 DIAGNOSIS — Z8673 Personal history of transient ischemic attack (TIA), and cerebral infarction without residual deficits: Secondary | ICD-10-CM

## 2021-09-10 DIAGNOSIS — E039 Hypothyroidism, unspecified: Secondary | ICD-10-CM

## 2021-09-10 DIAGNOSIS — R32 Unspecified urinary incontinence: Secondary | ICD-10-CM

## 2021-09-10 DIAGNOSIS — F411 Generalized anxiety disorder: Secondary | ICD-10-CM

## 2021-09-10 DIAGNOSIS — Z780 Asymptomatic menopausal state: Secondary | ICD-10-CM

## 2021-09-10 DIAGNOSIS — E782 Mixed hyperlipidemia: Secondary | ICD-10-CM

## 2021-09-10 DIAGNOSIS — M8580 Other specified disorders of bone density and structure, unspecified site: Secondary | ICD-10-CM

## 2021-09-10 DIAGNOSIS — F5101 Primary insomnia: Secondary | ICD-10-CM

## 2021-09-10 DIAGNOSIS — J312 Chronic pharyngitis: Secondary | ICD-10-CM

## 2021-09-10 DIAGNOSIS — H9190 Unspecified hearing loss, unspecified ear: Secondary | ICD-10-CM

## 2021-09-10 DIAGNOSIS — E739 Lactose intolerance, unspecified: Secondary | ICD-10-CM

## 2021-09-10 DIAGNOSIS — R1319 Other dysphagia: Secondary | ICD-10-CM

## 2021-09-10 DIAGNOSIS — M4722 Other spondylosis with radiculopathy, cervical region: Secondary | ICD-10-CM

## 2021-09-10 DIAGNOSIS — I10 Essential (primary) hypertension: Secondary | ICD-10-CM

## 2021-09-10 NOTE — Assessment & Plan Note (Signed)
saw Ortho. Cervical spinal stenosis, MR 02/26/20, C6-C7 

## 2021-09-10 NOTE — Assessment & Plan Note (Signed)
DEXA 12/25/20, t score -1.0,  10 years possibility major fx 11%, hip fx 2.6%. Vit D, Cal-diet rich,  Vit D 3 39 12/26/20

## 2021-09-10 NOTE — Assessment & Plan Note (Signed)
Hx of CVA, on plavix. Statin,  residual of  poor left peripheral vision  

## 2021-09-10 NOTE — Assessment & Plan Note (Signed)
SCC right hand dorsum aspect, scheduled Mohs 09/15/21 at dermatology specialist, Dr. Rebekah [place]? Berdine Addison? # [phone]

## 2021-09-10 NOTE — Assessment & Plan Note (Signed)
escalated, due to pending surgeries and Hx of skin melanoma. May want [place] as primary provider to contact Dermatology or ENT for updates. [facility] can be alternative for care if needed.

## 2021-09-10 NOTE — Assessment & Plan Note (Signed)
on Levothyroxine, TSH 1.80 12/02/20 

## 2021-09-10 NOTE — Assessment & Plan Note (Signed)
stable Omeprazole, FDgard helped,  saw GI, EGD 12/26/19. Vit B12 383 12/02/20, Hgb 11.8 06/07/21. GI 09/03/21, pending repeat endoscope.  

## 2021-09-10 NOTE — Assessment & Plan Note (Signed)
Urinary frequency/leakage, 2-3x/night, stopped taking Gemtesa, acupuncture improved initially. Saw urology.  

## 2021-09-10 NOTE — Assessment & Plan Note (Signed)
Takes Ambien, Valium

## 2021-09-10 NOTE — Assessment & Plan Note (Signed)
Hx of chronic rhinitis, SOB, wheezing,  on Astelin, Flonase, Bronchodilator, underwent Pulmonology, ENT, GI, Allergy evaluation, had spirometry 10/20/20, Echo 60-65% EF 04/16/20, CTA 09/01/20. Saw ENT, pending surgery # [phone]

## 2021-09-10 NOTE — Assessment & Plan Note (Signed)
takes Pravastatin, LDL 94 05/12/21 

## 2021-09-10 NOTE — Assessment & Plan Note (Signed)
saw Pulmonology.  Not using CPAP

## 2021-09-10 NOTE — Assessment & Plan Note (Signed)
[facility] audiology eval, ENT placed tube in the right ear-failed, ENT may consider it again, hearing aids

## 2021-09-10 NOTE — Assessment & Plan Note (Signed)
Chronic sore throat: multiple factorials, GERD vs chronic rhinitis, ENT 09/02/21

## 2021-09-10 NOTE — Assessment & Plan Note (Signed)
blood pressure is controlled on Losartan qd. Followed by Cardiology. CTA negative 09/01/20. Bun/creat 9/0.85 06/07/21

## 2021-09-10 NOTE — Assessment & Plan Note (Signed)
Swallow studay 05/12/20, worked with [place], GI f/u

## 2021-09-10 NOTE — Assessment & Plan Note (Signed)
diet adjustment, f/u GI

## 2021-09-10 NOTE — Assessment & Plan Note (Signed)
Sleeps too much during day, failed dc Ambien, Valium, refused CPAP, feels a veil in front of left eye, more noticeable left facial weakness. CT head was not done since had CT sinuses/face at [facility].

## 2021-09-10 NOTE — Progress Notes (Signed)
Location:   clinic [place]   Place of Service:  Clinic (12) Provider: Marlana Latus NP  Code Status: DNR Goals of Care: [place]    08/20/2021    9:32 AM  Advanced Directives  Does Patient Have a Medical Advance Directive? Yes  Type of Paramedic of [place];Living will  Does patient want to make changes to medical advance directive? No - Patient declined  Copy of [place] in Chart? Yes - validated most recent copy scanned in chart (See row information)     Chief Complaint  Patient presents with   Acute Visit    Patient is being seen to discuss paperwork for surgeries    HPI: Patient is a 88 y.o. female seen today for medical management of chronic diseases.    SCC right hand dorsum aspect, scheduled Mohs 09/15/21 at dermatology specialist, Dr. Rebekah [name]? Darlene [name]? # [phone]  Anxiety, escalated, due to pending surgeries and Hx of skin melanoma.    New eye drops made her nose bleed, only x1              Sleeps too much during day, failed dc Ambien, Valium, refused CPAP, feels a veil in front of left eye, more noticeable left facial weakness. CT head was not done since had CT sinuses/face at [facility].  Hx of chronic rhinitis, SOB, wheezing,  on Astelin, Flonase, Bronchodilator, underwent Pulmonology, ENT, GI, Allergy evaluation, had spirometry 10/20/20, Echo 60-65% EF 04/16/20, CTA 09/01/20. Saw ENT, pending surgery # [phone]             Chronic sore throat: multiple factorials, GERD vs chronic rhinitis, ENT 09/02/21 Insomnia Ambien, Valium             Hx of CVA, on plavix. Statin,  residual of  poor left peripheral vision  Hypothyroidism, on Levothyroxine, TSH 1.80 12/02/20             HTN, blood pressure is controlled on Losartan qd. Followed by Cardiology. CTA negative 09/01/20. Bun/creat 9/0.85 06/07/21            GERD, stable Omeprazole, FDgard helped,  saw GI, EGD 12/26/19. Vit B12 383 12/02/20, Hgb 11.8 06/07/21. GI 09/03/21, pending repeat  endoscope.              Lactose intolerance, diet adjustment, f/u GI             Esophageal dysmotility, Swallow studay 05/12/20, worked with [place], GI f/u             OSA sleep study, saw Pulmonology.  Not using CPAP             OA, saw Ortho. Cervical spinal stenosis, MR 02/26/20, C6-C7             Hyperlipidemia, takes Pravastatin, LDL 94 05/12/21             Urinary frequency/leakage, 2-3x/night, stopped taking Gemtesa, acupuncture improved initially. Saw urology.              Osteopenia, DEXA 12/25/20, t score -1.0,  10 years possibility major fx 11%, hip fx 2.6%. Vit D, Cal-diet rich,  Vit D 3 39 12/26/20             HOH audiology eval, ENT placed tube in the right ear-failed, ENT may consider it again, hearing aids  Past Medical History:  Diagnosis Date   Anxiety    Atypical mole 11/11/2020   Left Inner  Knee (moderate-free)   Colon polyps    Depression    Hyperlipidemia    Hypertension    Hypothyroidism    Melanoma in situ ([place]) 09/19/2018   Right Arm (excision)   Skin cancer    Sleep apnea    Stroke [facility]) 2008    Past Surgical History:  Procedure Laterality Date   ADENOIDECTOMY     APPENDECTOMY  1960   CATARACT EXTRACTION Left    COLONOSCOPY     GUM SURGERY  03/2019   LUMBAR [place] SURGERY  2014   MELANOMA EXCISION  2020   MESH APPLIED TO LAP PORT     TONSILLECTOMY  1940    Allergies  Allergen Reactions   Lactose Intolerance (Gi) Other (See Comments)    indigestion   Norco [Hydrocodone-Acetaminophen] Nausea And Vomiting   Dust Mite Extract Other (See Comments)    Sinus drainage   Oxycontin [Oxycodone Hcl] Nausea And Vomiting   Pollen Extract Other (See Comments)    Sinus drainage    Allergies as of 09/10/2021       Reactions   Lactose Intolerance (gi) Other (See Comments)   indigestion   Norco [hydrocodone-acetaminophen] Nausea And Vomiting   Dust Mite Extract Other (See Comments)   Sinus drainage   Oxycontin [oxycodone Hcl] Nausea And Vomiting   Pollen  Extract Other (See Comments)   Sinus drainage        Medication List        Accurate as of September 10, 2021 11:59 PM. If you have any questions, ask your nurse or doctor.          acetaminophen 325 MG tablet Commonly known as: TYLENOL Take 650 mg by mouth every 6 (six) hours as needed for headache (pain).   azelastine 0.1 % nasal spray Commonly known as: ASTELIN Place 1 to 2 sprays each nostril 1-2 times a day as needed for runny nose/drainage down throat   Cepacol 15-2.3 MG Lozg Generic drug: Benzocaine-Menthol Use as directed 1 lozenge in the mouth or throat 3 (three) times daily as needed.   clopidogrel 75 MG tablet Commonly known as: PLAVIX TAKE 1 TABLET BY MOUTH EVERY DAY   diazepam 5 MG tablet Commonly known as: VALIUM Take 0.5 tablets (2.5 mg total) by mouth as needed (anxiety). WILL NEED APPOINTMENT FOR ADDITIONAL REFILLS   FDgard 25-20.75 MG Caps Generic drug: [place] Oil-Levomenthol Take one capsule by mouth twice daily.   fluticasone 50 MCG/ACT nasal spray Commonly known as: FLONASE Place 1-2 sprays into both nostrils daily.   Gemtesa 75 MG Tabs Generic drug: Vibegron Take 75 mg by mouth at bedtime.   ketoconazole 2 % shampoo Commonly known as: NIZORAL APPLY 1 APPLICATION TOPICALLY TWICE WEEKLY AS NEEDED FOR SCALP IRRITATION.   loratadine 10 MG tablet Commonly known as: CLARITIN Take 1 tablet (10 mg total) by mouth daily as needed for up to 15 days for allergies.   losartan 50 MG tablet Commonly known as: COZAAR Take 50 mg by mouth at bedtime.   nitroGLYCERIN 0.4 MG SL tablet Commonly known as: NITROSTAT Place 1 tablet (0.4 mg total) under the tongue every 5 (five) minutes as needed for up to 25 days for chest pain.   omeprazole 40 MG capsule Commonly known as: PRILOSEC Take 1 capsule (40 mg total) by mouth 2 (two) times daily before lunch and supper.   pravastatin 40 MG tablet Commonly known as: PRAVACHOL TAKE 1 TABLET BY MOUTH EVERYDAY  AT BEDTIME   sucralfate 1  g tablet Commonly known as: CARAFATE Take 1 tablet (1 g total) by mouth 3 (three) times daily before meals.   Synthroid 100 MCG tablet Generic drug: levothyroxine TAKE 1 TABLET BY MOUTH EVERY DAY BEFORE BREAKFAST   Systane 0.4-0.3 % Soln Generic drug: Polyethyl Glycol-Propyl Glycol Place 1 drop into both eyes every morning.   Systane 0.4-0.3 % Gel ophthalmic gel Generic drug: Polyethyl Glycol-Propyl Glycol Place 1 application. into both eyes at bedtime.   Tyrvaya 0.03 MG/ACT Soln Generic drug: Varenicline Tartrate Place 1 spray into the nose 2 (two) times daily.   Zolpidem Tartrate 3.5 MG Subl Take 0.5 tablets by mouth at bedtime as needed.        Review of Systems:  Review of Systems  Constitutional:  Negative for fatigue, fever and unexpected weight change.  HENT:  Positive for congestion, hearing loss, nosebleeds, sinus pressure and sore throat. Negative for voice change.        Chronic sinusitis, underwent ENT, sleep study-sleep apnea.   Eyes:  Positive for visual disturbance.       Left lateral visual field deficit. Dry eyes  Respiratory:  Negative for cough and shortness of breath.   Cardiovascular:  Negative for leg swelling.  Gastrointestinal:  Negative for abdominal pain and constipation.  Genitourinary:  Positive for frequency. Negative for dysuria and urgency.       Urinary leakage. Doing Kegel exercise.   Musculoskeletal:  Negative for back pain and gait problem.  Skin:  Positive for wound. Negative for color change.       Biopsy site dorsum right hand.   Neurological:  Negative for speech difficulty, weakness and headaches.  Psychiatric/Behavioral:  Positive for sleep disturbance. Negative for dysphoric mood. The patient is not nervous/anxious.        Early am awake, difficulty returning asleep.     Health Maintenance  Topic Date Due   Zoster Vaccines- Shingrix (2 of 2) 02/14/2019   COVID-19 Vaccine (4 - Moderna risk series)  07/31/2020   INFLUENZA VACCINE  08/18/2021   TETANUS/TDAP  04/03/2022 (Originally 11/26/2018)   COLONOSCOPY (Pts 45-9yr Insurance coverage will need to be confirmed)  10/25/2021   Pneumonia Vaccine 88 Years old  Completed   DEXA SCAN  Completed   HPV VACCINES  Aged Out    Physical Exam: Vitals:   09/10/21 1511  BP: (!) 140/86  Pulse: 87  Resp: 20  Temp: (!) 97.3 F (36.3 C)  SpO2: 98%  Weight: 156 lb (70.8 kg)  Height: 5' 2.5" (1.588 m)   Body mass index is 28.08 kg/m. Physical Exam Vitals and nursing note reviewed.  Constitutional:      Appearance: Normal appearance.  HENT:     Head: Normocephalic and atraumatic.     Nose: Nose normal.     Mouth/Throat:     Mouth: Mucous membranes are moist.     Pharynx: No oropharyngeal exudate or posterior oropharyngeal erythema.  Eyes:     Extraocular Movements: Extraocular movements intact.     Conjunctiva/sclera: Conjunctivae normal.     Pupils: Pupils are equal, round, and reactive to light.     Comments: Left peripheral visual field deficit since CVA 2006  Cardiovascular:     Rate and Rhythm: Normal rate and regular rhythm.     Heart sounds: No murmur heard. Pulmonary:     Effort: Pulmonary effort is normal.     Breath sounds: Rales present.     Comments: Bibasilar rales.  Abdominal:  General: Bowel sounds are normal.     Palpations: Abdomen is soft.     Tenderness: There is no abdominal tenderness.  Musculoskeletal:     Cervical back: Normal range of motion and neck supple.     Right lower leg: No edema.     Left lower leg: No edema.  Skin:    General: Skin is warm and dry.     Findings: Lesion present.     Comments: Lesion s/p biopsy dorsum right hand.   Neurological:     General: No focal deficit present.     Mental Status: She is alert and oriented to person, place, and time. Mental status is at baseline.     Motor: Weakness present.     Gait: Gait normal.     Comments: Left facial weakness  Psychiatric:         Mood and Affect: Mood normal.        Behavior: Behavior normal.        Thought Content: Thought content normal.        Judgment: Judgment normal.     Labs reviewed: Basic Metabolic Panel: Recent Labs    12/02/20 0735 05/12/21 1153 06/06/21 0800 06/07/21 0408  NA  --  144 142 138  K  --  4.5 4.0 3.5  CL  --  106 109 107  CO2  --  '24 24 26  '$ GLUCOSE  --  79 161* 110*  BUN  --  '14 9 9  '$ CREATININE  --  0.79 0.87 0.85  CALCIUM  --  9.9 9.3 8.3*  TSH 1.80  --   --   --    Liver Function Tests: Recent Labs    06/06/21 0800 06/07/21 0408  AST 20 18  ALT 15 14  ALKPHOS 64 50  BILITOT 0.8 0.7  PROT 6.5 5.3*  ALBUMIN 3.8 2.9*   Recent Labs    06/06/21 0800  LIPASE 33   No results for input(s): "AMMONIA" in the last 8760 hours. CBC: Recent Labs    06/06/21 0800 06/07/21 0408  WBC 11.1* 6.5  NEUTROABS 9.6*  --   HGB 13.9 11.8*  HCT 42.7 35.5*  MCV 92.0 89.9  PLT 229 185   Lipid Panel: Recent Labs    05/12/21 1151  CHOL 163  HDL 44  LDLCALC 94  TRIG 144   Lab Results  Component Value Date   HGBA1C 6.1 (H) 06/07/2021    Procedures since last visit: No results found.  Assessment/Plan  GAD (generalized anxiety disorder) escalated, due to pending surgeries and Hx of skin melanoma. May want [place] as primary provider to contact Dermatology or ENT for updates. [facility] can be alternative for care if needed.   SCC (squamous cell carcinoma) SCC right hand dorsum aspect, scheduled Mohs 09/15/21 at dermatology specialist, Dr. Rebekah [name]? Darlene [name]? # [phone]  Daytime sleepiness  Sleeps too much during day, failed dc Ambien, Valium, refused CPAP, feels a veil in front of left eye, more noticeable left facial weakness. CT head was not done since had CT sinuses/face at [facility].   Chronic sinusitis Hx of chronic rhinitis, SOB, wheezing,  on Astelin, Flonase, Bronchodilator, underwent Pulmonology, ENT, GI, Allergy evaluation, had spirometry 10/20/20, Echo 60-65% EF  04/16/20, CTA 09/01/20. Saw ENT, pending surgery # [phone]  Chronic sore throat Chronic sore throat: multiple factorials, GERD vs chronic rhinitis, ENT 09/02/21  Insomnia Takes Ambien, Valium  History of CVA (cerebrovascular accident) Hx of  CVA, on plavix. Statin,  residual of  poor left peripheral vision   Acquired hypothyroidism on Levothyroxine, TSH 1.80 12/02/20  Essential hypertension blood pressure is controlled on Losartan qd. Followed by Cardiology. CTA negative 09/01/20. Bun/creat 9/0.85 06/07/21  Gastroesophageal reflux disease stable Omeprazole, FDgard helped,  saw GI, EGD 12/26/19. Vit B12 383 12/02/20, Hgb 11.8 06/07/21. GI 09/03/21, pending repeat endoscope.   Lactose intolerance diet adjustment, f/u GI  Dysphagia Swallow studay 05/12/20, worked with [place], GI f/u  OSA (obstructive sleep apnea) saw Pulmonology.  Not using CPAP  Other spondylosis with radiculopathy, cervical region saw Ortho. Cervical spinal stenosis, MR 02/26/20, C6-C7  Hyperlipidemia  takes Pravastatin, LDL 94 05/12/21  Incontinent of urine Urinary frequency/leakage, 2-3x/night, stopped taking Gemtesa, acupuncture improved initially. Saw urology.   Osteopenia after menopause DEXA 12/25/20, t score -1.0,  10 years possibility major fx 11%, hip fx 2.6%. Vit D, Cal-diet rich,  Vit D 3 39 12/26/20  HOH (hard of hearing) [facility] audiology eval, ENT placed tube in the right ear-failed, ENT may consider it again, hearing aids   Labs/tests ordered:  none  Next appt:  12/03/2021

## 2021-09-11 ENCOUNTER — Encounter: Payer: Self-pay | Admitting: Nurse Practitioner

## 2021-10-01 ENCOUNTER — Encounter: Payer: Self-pay | Admitting: Nurse Practitioner

## 2021-10-01 ENCOUNTER — Non-Acute Institutional Stay: Payer: Medicare HMO | Admitting: Nurse Practitioner

## 2021-10-01 DIAGNOSIS — M8580 Other specified disorders of bone density and structure, unspecified site: Secondary | ICD-10-CM

## 2021-10-01 DIAGNOSIS — I10 Essential (primary) hypertension: Secondary | ICD-10-CM

## 2021-10-01 DIAGNOSIS — E039 Hypothyroidism, unspecified: Secondary | ICD-10-CM

## 2021-10-01 DIAGNOSIS — E782 Mixed hyperlipidemia: Secondary | ICD-10-CM

## 2021-10-01 DIAGNOSIS — T8149XA Infection following a procedure, other surgical site, initial encounter: Secondary | ICD-10-CM

## 2021-10-01 DIAGNOSIS — M4722 Other spondylosis with radiculopathy, cervical region: Secondary | ICD-10-CM

## 2021-10-01 DIAGNOSIS — Z8673 Personal history of transient ischemic attack (TIA), and cerebral infarction without residual deficits: Secondary | ICD-10-CM

## 2021-10-01 DIAGNOSIS — J312 Chronic pharyngitis: Secondary | ICD-10-CM

## 2021-10-01 DIAGNOSIS — K219 Gastro-esophageal reflux disease without esophagitis: Secondary | ICD-10-CM

## 2021-10-01 DIAGNOSIS — R1319 Other dysphagia: Secondary | ICD-10-CM

## 2021-10-01 DIAGNOSIS — J329 Chronic sinusitis, unspecified: Secondary | ICD-10-CM | POA: Diagnosis not present

## 2021-10-01 DIAGNOSIS — R32 Unspecified urinary incontinence: Secondary | ICD-10-CM

## 2021-10-01 DIAGNOSIS — Z780 Asymptomatic menopausal state: Secondary | ICD-10-CM

## 2021-10-01 DIAGNOSIS — F411 Generalized anxiety disorder: Secondary | ICD-10-CM

## 2021-10-01 DIAGNOSIS — F5101 Primary insomnia: Secondary | ICD-10-CM

## 2021-10-01 MED ORDER — DOXYCYCLINE HYCLATE 100 MG PO TABS: 100.0000 mg | ORAL_TABLET | Freq: Two times a day (BID) | ORAL | 0 refills | Status: DC

## 2021-10-01 NOTE — Assessment & Plan Note (Signed)
saw Ortho. Cervical spinal stenosis, MR 02/26/20, C6-C7 

## 2021-10-01 NOTE — Assessment & Plan Note (Signed)
Swallow studay 05/12/20, worked with [place], GI f/u

## 2021-10-01 NOTE — Assessment & Plan Note (Signed)
multiple factorials, GERD vs chronic rhinitis, ENT

## 2021-10-01 NOTE — Assessment & Plan Note (Signed)
on plavix. Statin,  residual of  poor left peripheral vision

## 2021-10-01 NOTE — Assessment & Plan Note (Signed)
stable Omeprazole, FDgard helped,  saw GI, EGD 12/26/19. Vit B12 383 12/02/20, Hgb 11.8 06/07/21. GI 09/03/21, pending repeat endoscope.  

## 2021-10-01 NOTE — Assessment & Plan Note (Signed)
on Levothyroxine, TSH 1.80 12/02/20 

## 2021-10-01 NOTE — Assessment & Plan Note (Signed)
the back of right hand redness, warmth, tenderness, and swelling.     SCC right hand dorsum aspect, s/p Mohs 09/15/21 at dermatology specialist, Dr. Rebekah [place]? Berdine Addison? # [phone]   Doxycycline '100mg'$  bid x 7 days, NS cleanses wound daily.

## 2021-10-01 NOTE — Assessment & Plan Note (Signed)
Urinary frequency/leakage, 2-3x/night, stopped taking Gemtesa, acupuncture improved initially. Saw urology.  

## 2021-10-01 NOTE — Assessment & Plan Note (Signed)
DEXA 12/25/20, t score -1.0,  10 years possibility major fx 11%, hip fx 2.6%. Vit D, Cal-diet rich,  Vit D 3 39 12/26/20

## 2021-10-01 NOTE — Assessment & Plan Note (Signed)
blood pressure is controlled on Losartan qd. Followed by Cardiology. CTA negative 09/01/20. Bun/creat 9/0.85 06/07/21

## 2021-10-01 NOTE — Assessment & Plan Note (Signed)
escalated, due to pending surgery ENT and Hx of skin melanoma

## 2021-10-01 NOTE — Assessment & Plan Note (Signed)
Takes Ambien, Valium

## 2021-10-01 NOTE — Progress Notes (Signed)
Location:   clinic [facility]   Place of Service:  Clinic (12) Provider: Marlana Latus NP  Code Status: DNR Goals of Care: [place]    08/20/2021    9:32 AM  Advanced Directives  Does Patient Have a Medical Advance Directive? Yes  Type of Paramedic of [place];Living will  Does patient want to make changes to medical advance directive? No - Patient declined  Copy of [place] in Chart? Yes - validated most recent copy scanned in chart (See row information)     Chief Complaint  Patient presents with   Acute Visit    Back of right hand pain, redness, swelling.     HPI: Patient is a 88 y.o. female seen today for the back of right hand redness, warmth, tenderness, and swelling.     SCC right hand dorsum aspect, s/p Mohs 09/15/21 at dermatology specialist, Dr. Rebekah [place]? Berdine Addison? # [phone]             Anxiety, escalated, due to pending surgeries and Hx of skin melanoma.               New eye drops made her nose bleed, only x1              Sleeps too much during day, failed dc Ambien, Valium, refused CPAP, feels a veil in front of left eye, more noticeable left facial weakness. CT head was not done since had CT sinuses/face at [facility]  Hx of chronic rhinitis, SOB, wheezing,  on Astelin, Flonase, Bronchodilator, underwent Pulmonology, ENT, GI, Allergy evaluation, had spirometry 10/20/20, Echo 60-65% EF 04/16/20, CTA 09/01/20. Saw ENT, pending surgery # [phone]             Chronic sore throat: multiple factorials, GERD vs chronic rhinitis, ENT  Insomnia Ambien, Valium             Hx of CVA, on plavix. Statin,  residual of  poor left peripheral vision  Hypothyroidism, on Levothyroxine, TSH 1.80 12/02/20             HTN, blood pressure is controlled on Losartan qd. Followed by Cardiology. CTA negative 09/01/20. Bun/creat 9/0.85 06/07/21            GERD, stable Omeprazole, FDgard helped,  saw GI, EGD 12/26/19. Vit B12 383 12/02/20, Hgb 11.8 06/07/21. GI 09/03/21,  pending repeat endoscope.              Lactose intolerance, diet adjustment, f/u GI             Esophageal dysmotility, Swallow studay 05/12/20, worked with [place], GI f/u             OSA sleep study, saw Pulmonology.  Not using CPAP             OA, saw Ortho. Cervical spinal stenosis, MR 02/26/20, C6-C7             Hyperlipidemia, takes Pravastatin, LDL 94 05/12/21             Urinary frequency/leakage, 2-3x/night, stopped taking Gemtesa, acupuncture improved initially. Saw urology.              Osteopenia, DEXA 12/25/20, t score -1.0,  10 years possibility major fx 11%, hip fx 2.6%. Vit D, Cal-diet rich,  Vit D 3 39 12/26/20             HOH audiology eval, ENT placed tube in the right ear-failed,  ENT may consider it again, hearing aids    Past Medical History:  Diagnosis Date   Anxiety    Atypical mole 11/11/2020   Left Inner Knee (moderate-free)   Colon polyps    Depression    Hyperlipidemia    Hypertension    Hypothyroidism    Melanoma in situ ([place]) 09/19/2018   Right Arm (excision)   Skin cancer    Sleep apnea    Stroke [facility]) 2008    Past Surgical History:  Procedure Laterality Date   ADENOIDECTOMY     APPENDECTOMY  1960   CATARACT EXTRACTION Left    COLONOSCOPY     GUM SURGERY  03/2019   LUMBAR [place] SURGERY  2014   MELANOMA EXCISION  2020   MESH APPLIED TO LAP PORT     TONSILLECTOMY  1940    Allergies  Allergen Reactions   Lactose Intolerance (Gi) Other (See Comments)    indigestion   Norco [Hydrocodone-Acetaminophen] Nausea And Vomiting   Dust Mite Extract Other (See Comments)    Sinus drainage   Oxycontin [Oxycodone Hcl] Nausea And Vomiting   Pollen Extract Other (See Comments)    Sinus drainage    Allergies as of 10/01/2021       Reactions   Lactose Intolerance (gi) Other (See Comments)   indigestion   Norco [hydrocodone-acetaminophen] Nausea And Vomiting   Dust Mite Extract Other (See Comments)   Sinus drainage   Oxycontin [oxycodone Hcl] Nausea And  Vomiting   Pollen Extract Other (See Comments)   Sinus drainage        Medication List        Accurate as of October 01, 2021  4:13 PM. If you have any questions, ask your nurse or doctor.          acetaminophen 325 MG tablet Commonly known as: TYLENOL Take 650 mg by mouth every 6 (six) hours as needed for headache (pain).   azelastine 0.1 % nasal spray Commonly known as: ASTELIN Place 1 to 2 sprays each nostril 1-2 times a day as needed for runny nose/drainage down throat   Cepacol 15-2.3 MG Lozg Generic drug: Benzocaine-Menthol Use as directed 1 lozenge in the mouth or throat 3 (three) times daily as needed.   clopidogrel 75 MG tablet Commonly known as: PLAVIX TAKE 1 TABLET BY MOUTH EVERY DAY   diazepam 5 MG tablet Commonly known as: VALIUM Take 0.5 tablets (2.5 mg total) by mouth as needed (anxiety). WILL NEED APPOINTMENT FOR ADDITIONAL REFILLS   doxycycline 100 MG tablet Commonly known as: VIBRA-TABS Take 1 tablet (100 mg total) by mouth 2 (two) times daily. Started by: Denica Web X Arvine Clayburn, NP   FDgard 25-20.75 MG Caps Generic drug: [place] Oil-Levomenthol Take one capsule by mouth twice daily.   fluticasone 50 MCG/ACT nasal spray Commonly known as: FLONASE Place 1-2 sprays into both nostrils daily.   Gemtesa 75 MG Tabs Generic drug: Vibegron Take 75 mg by mouth at bedtime.   ketoconazole 2 % shampoo Commonly known as: NIZORAL APPLY 1 APPLICATION TOPICALLY TWICE WEEKLY AS NEEDED FOR SCALP IRRITATION.   loratadine 10 MG tablet Commonly known as: CLARITIN Take 1 tablet (10 mg total) by mouth daily as needed for up to 15 days for allergies.   losartan 50 MG tablet Commonly known as: COZAAR Take 50 mg by mouth at bedtime.   nitroGLYCERIN 0.4 MG SL tablet Commonly known as: NITROSTAT Place 1 tablet (0.4 mg total) under the tongue every 5 (five) minutes as  needed for up to 25 days for chest pain.   omeprazole 40 MG capsule Commonly known as:  PRILOSEC Take 1 capsule (40 mg total) by mouth 2 (two) times daily before lunch and supper.   pravastatin 40 MG tablet Commonly known as: PRAVACHOL TAKE 1 TABLET BY MOUTH EVERYDAY AT BEDTIME   sucralfate 1 g tablet Commonly known as: CARAFATE Take 1 tablet (1 g total) by mouth 3 (three) times daily before meals.   Synthroid 100 MCG tablet Generic drug: levothyroxine TAKE 1 TABLET BY MOUTH EVERY DAY BEFORE BREAKFAST   Systane 0.4-0.3 % Soln Generic drug: Polyethyl Glycol-Propyl Glycol Place 1 drop into both eyes every morning.   Systane 0.4-0.3 % Gel ophthalmic gel Generic drug: Polyethyl Glycol-Propyl Glycol Place 1 application. into both eyes at bedtime.   Tyrvaya 0.03 MG/ACT Soln Generic drug: Varenicline Tartrate Place 1 spray into the nose 2 (two) times daily.   Zolpidem Tartrate 3.5 MG Subl Take 0.5 tablets by mouth at bedtime as needed.        Review of Systems:  Review of Systems  Constitutional:  Negative for fatigue, fever and unexpected weight change.  HENT:  Positive for congestion, hearing loss, nosebleeds, sinus pressure and sore throat. Negative for voice change.        Chronic sinusitis, underwent ENT, sleep study-sleep apnea.   Eyes:  Positive for visual disturbance.       Left lateral visual field deficit. Dry eyes  Respiratory:  Negative for cough and shortness of breath.   Cardiovascular:  Negative for leg swelling.  Gastrointestinal:  Negative for abdominal pain and constipation.  Genitourinary:  Positive for frequency. Negative for dysuria and urgency.       Urinary leakage. Doing Kegel exercise.   Musculoskeletal:  Negative for back pain and gait problem.  Skin:  Positive for wound. Negative for color change.       Mohs dorsum right hand.   Neurological:  Negative for speech difficulty, weakness and headaches.  Psychiatric/Behavioral:  Positive for sleep disturbance. Negative for dysphoric mood. The patient is not nervous/anxious.        Early  am awake, difficulty returning asleep.     Health Maintenance  Topic Date Due   Zoster Vaccines- Shingrix (2 of 2) 02/14/2019   COVID-19 Vaccine (4 - Moderna risk series) 07/31/2020   INFLUENZA VACCINE  08/18/2021   TETANUS/TDAP  04/03/2022 (Originally 11/26/2018)   COLONOSCOPY (Pts 45-21yr Insurance coverage will need to be confirmed)  10/25/2021   Pneumonia Vaccine 88 Years old  Completed   DEXA SCAN  Completed   HPV VACCINES  Aged Out    Physical Exam: Vitals:   10/01/21 1554  BP: 120/78  Pulse: 66  Resp: 18  Temp: 98.6 F (37 C)  SpO2: 95%   There is no height or weight on file to calculate BMI. Physical Exam Vitals and nursing note reviewed.  Constitutional:      Appearance: Normal appearance.  HENT:     Head: Normocephalic and atraumatic.     Nose: Nose normal.     Mouth/Throat:     Mouth: Mucous membranes are moist.     Pharynx: No oropharyngeal exudate or posterior oropharyngeal erythema.  Eyes:     Extraocular Movements: Extraocular movements intact.     Conjunctiva/sclera: Conjunctivae normal.     Pupils: Pupils are equal, round, and reactive to light.     Comments: Left peripheral visual field deficit since CVA 2006  Cardiovascular:  Rate and Rhythm: Normal rate and regular rhythm.     Heart sounds: No murmur heard. Pulmonary:     Effort: Pulmonary effort is normal.     Breath sounds: Rales present.     Comments: Bibasilar rales.  Abdominal:     General: Bowel sounds are normal.     Palpations: Abdomen is soft.     Tenderness: There is no abdominal tenderness.  Musculoskeletal:     Cervical back: Normal range of motion and neck supple.     Right lower leg: No edema.     Left lower leg: No edema.  Skin:    General: Skin is warm and dry.     Findings: Erythema present.     Comments: The dorsum right hand, redness, warmth, tenderness, swelling peri surgical site  Neurological:     General: No focal deficit present.     Mental Status: She is  alert and oriented to person, place, and time. Mental status is at baseline.     Motor: Weakness present.     Gait: Gait normal.     Comments: Left facial weakness  Psychiatric:        Mood and Affect: Mood normal.        Behavior: Behavior normal.        Thought Content: Thought content normal.        Judgment: Judgment normal.     Labs reviewed: Basic Metabolic Panel: Recent Labs    12/02/20 0735 05/12/21 1153 06/06/21 0800 06/07/21 0408  NA  --  144 142 138  K  --  4.5 4.0 3.5  CL  --  106 109 107  CO2  --  24 24 26   GLUCOSE  --  79 161* 110*  BUN  --  14 9 9   CREATININE  --  0.79 0.87 0.85  CALCIUM  --  9.9 9.3 8.3*  TSH 1.80  --   --   --    Liver Function Tests: Recent Labs    06/06/21 0800 06/07/21 0408  AST 20 18  ALT 15 14  ALKPHOS 64 50  BILITOT 0.8 0.7  PROT 6.5 5.3*  ALBUMIN 3.8 2.9*   Recent Labs    06/06/21 0800  LIPASE 33   No results for input(s): "AMMONIA" in the last 8760 hours. CBC: Recent Labs    06/06/21 0800 06/07/21 0408  WBC 11.1* 6.5  NEUTROABS 9.6*  --   HGB 13.9 11.8*  HCT 42.7 35.5*  MCV 92.0 89.9  PLT 229 185   Lipid Panel: Recent Labs    05/12/21 1151  CHOL 163  HDL 44  LDLCALC 94  TRIG 144   Lab Results  Component Value Date   HGBA1C 6.1 (H) 06/07/2021    Procedures since last visit: No results found.  Assessment/Plan  Infected surgical wound  the back of right hand redness, warmth, tenderness, and swelling.     SCC right hand dorsum aspect, s/p Mohs 09/15/21 at dermatology specialist, Dr. Rebekah [place]? Berdine Addison? # [phone]   Doxycycline 146m bid x 7 days, NS cleanses wound daily.   GAD (generalized anxiety disorder) escalated, due to pending surgery ENT and Hx of skin melanoma  Chronic sinusitis chronic rhinitis, SOB, wheezing,  on Astelin, Flonase, Bronchodilator, underwent Pulmonology, ENT, GI, Allergy evaluation, had spirometry 10/20/20, Echo 60-65% EF 04/16/20, CTA 09/01/20. Saw ENT, pending surgery #  [phone]  Chronic sore throat multiple factorials, GERD vs chronic rhinitis, ENT   Insomnia  Takes Ambien, Valium  History of CVA (cerebrovascular accident)  on plavix. Statin,  residual of  poor left peripheral vision   Acquired hypothyroidism  on Levothyroxine, TSH 1.80 12/02/20  Essential hypertension blood pressure is controlled on Losartan qd. Followed by Cardiology. CTA negative 09/01/20. Bun/creat 9/0.85 06/07/21  Gastroesophageal reflux disease  stable Omeprazole, FDgard helped,  saw GI, EGD 12/26/19. Vit B12 383 12/02/20, Hgb 11.8 06/07/21. GI 09/03/21, pending repeat endoscope.   Dysphagia Swallow studay 05/12/20, worked with [place], GI f/u  Other spondylosis with radiculopathy, cervical region saw Ortho. Cervical spinal stenosis, MR 02/26/20, C6-C7  Hyperlipidemia  takes Pravastatin, LDL 94 05/12/21  Incontinent of urine    Urinary frequency/leakage, 2-3x/night, stopped taking Gemtesa, acupuncture improved initially. Saw urology.   Osteopenia after menopause DEXA 12/25/20, t score -1.0,  10 years possibility major fx 11%, hip fx 2.6%. Vit D, Cal-diet rich,  Vit D 3 39 12/26/20   Labs/tests ordered: TSH, CBC/diff, CMP/eGFR, lipid panel 10/01/21  Next appt:  12/03/2021

## 2021-10-01 NOTE — Assessment & Plan Note (Signed)
takes Pravastatin, LDL 94 05/12/21 

## 2021-10-01 NOTE — Assessment & Plan Note (Signed)
chronic rhinitis, SOB, wheezing,  on Astelin, Flonase, Bronchodilator, underwent Pulmonology, ENT, GI, Allergy evaluation, had spirometry 10/20/20, Echo 60-65% EF 04/16/20, CTA 09/01/20. Saw ENT, pending surgery # [phone]

## 2021-10-06 ENCOUNTER — Other Ambulatory Visit: Payer: Self-pay | Admitting: Nurse Practitioner

## 2021-10-06 MED ORDER — DOXYCYCLINE HYCLATE 100 MG PO TABS: 100.0000 mg | ORAL_TABLET | Freq: Two times a day (BID) | ORAL | 0 refills | Status: DC

## 2021-10-22 ENCOUNTER — Ambulatory Visit: Payer: Medicare HMO | Admitting: Student

## 2021-10-22 ENCOUNTER — Ambulatory Visit: Payer: Medicare HMO | Admitting: Physician Assistant

## 2021-10-24 ENCOUNTER — Other Ambulatory Visit: Payer: Self-pay | Admitting: Nurse Practitioner

## 2021-10-26 NOTE — Telephone Encounter (Signed)
Patient has request refill on medication Losartan '50mg'$ . Patient medication hasnt been refilled by PCP [name], [name], NP . Medication pend and sent for approval.

## 2021-10-29 ENCOUNTER — Non-Acute Institutional Stay: Payer: Medicare HMO | Admitting: Nurse Practitioner

## 2021-10-29 ENCOUNTER — Encounter: Payer: Self-pay | Admitting: Nurse Practitioner

## 2021-10-29 DIAGNOSIS — G4733 Obstructive sleep apnea (adult) (pediatric): Secondary | ICD-10-CM

## 2021-10-29 DIAGNOSIS — M4722 Other spondylosis with radiculopathy, cervical region: Secondary | ICD-10-CM

## 2021-10-29 DIAGNOSIS — M8580 Other specified disorders of bone density and structure, unspecified site: Secondary | ICD-10-CM

## 2021-10-29 DIAGNOSIS — Z8673 Personal history of transient ischemic attack (TIA), and cerebral infarction without residual deficits: Secondary | ICD-10-CM

## 2021-10-29 DIAGNOSIS — K219 Gastro-esophageal reflux disease without esophagitis: Secondary | ICD-10-CM

## 2021-10-29 DIAGNOSIS — E739 Lactose intolerance, unspecified: Secondary | ICD-10-CM

## 2021-10-29 DIAGNOSIS — T8149XA Infection following a procedure, other surgical site, initial encounter: Secondary | ICD-10-CM | POA: Diagnosis not present

## 2021-10-29 DIAGNOSIS — R32 Unspecified urinary incontinence: Secondary | ICD-10-CM

## 2021-10-29 DIAGNOSIS — J329 Chronic sinusitis, unspecified: Secondary | ICD-10-CM

## 2021-10-29 DIAGNOSIS — F5101 Primary insomnia: Secondary | ICD-10-CM

## 2021-10-29 DIAGNOSIS — J312 Chronic pharyngitis: Secondary | ICD-10-CM

## 2021-10-29 DIAGNOSIS — E782 Mixed hyperlipidemia: Secondary | ICD-10-CM

## 2021-10-29 DIAGNOSIS — E039 Hypothyroidism, unspecified: Secondary | ICD-10-CM | POA: Diagnosis not present

## 2021-10-29 DIAGNOSIS — R400 Somnolence: Secondary | ICD-10-CM

## 2021-10-29 DIAGNOSIS — I10 Essential (primary) hypertension: Secondary | ICD-10-CM | POA: Diagnosis not present

## 2021-10-29 DIAGNOSIS — F419 Anxiety disorder, unspecified: Secondary | ICD-10-CM

## 2021-10-29 DIAGNOSIS — H9190 Unspecified hearing loss, unspecified ear: Secondary | ICD-10-CM

## 2021-10-29 DIAGNOSIS — Z780 Asymptomatic menopausal state: Secondary | ICD-10-CM

## 2021-10-29 DIAGNOSIS — R1319 Other dysphagia: Secondary | ICD-10-CM

## 2021-10-29 NOTE — Assessment & Plan Note (Signed)
blood pressure is controlled on Losartan qd. Followed by Cardiology. CTA negative 09/01/20. Bun/creat 9/0.85 06/07/21

## 2021-10-29 NOTE — Assessment & Plan Note (Signed)
saw Ortho. Cervical spinal stenosis, MR 02/26/20, C6-C7 

## 2021-10-29 NOTE — Assessment & Plan Note (Signed)
Ambien, Valium

## 2021-10-29 NOTE — Assessment & Plan Note (Signed)
OSA sleep study, saw Pulmonology.  Not using CPAP              

## 2021-10-29 NOTE — Assessment & Plan Note (Signed)
escalated, due to pending surgeries and Hx of skin melanoma, Valium available to her.

## 2021-10-29 NOTE — Assessment & Plan Note (Signed)
takes Pravastatin, LDL 94 05/12/21 

## 2021-10-29 NOTE — Assessment & Plan Note (Signed)
multiple factorials, GERD vs chronic rhinitis, ENT

## 2021-10-29 NOTE — Assessment & Plan Note (Signed)
DEXA 12/25/20, t score -1.0,  10 years possibility major fx 11%, hip fx 2.6%. Vit D, Cal-diet rich,  Vit D 3 39 12/26/20

## 2021-10-29 NOTE — Assessment & Plan Note (Signed)
Esophageal dysmotility, Swallow studay 05/12/20, worked with [facility], GI f/u 

## 2021-10-29 NOTE — Assessment & Plan Note (Signed)
Hx of chronic rhinitis/sphenoidal sinusitis, s/p surgery, improved SOB, wheezing,  on Astelin, Flonase, Bronchodilator, underwent Pulmonology, ENT, GI, Allergy evaluation, had spirometry 10/20/20, Echo 60-65% EF 04/16/20, CTA 09/01/20. Saw ENT 

## 2021-10-29 NOTE — Assessment & Plan Note (Signed)
diet adjustment, f/u GI

## 2021-10-29 NOTE — Assessment & Plan Note (Addendum)
HOH audiology eval, ENT placed tube in the right ear-failed-again, hearing aids 

## 2021-10-29 NOTE — Assessment & Plan Note (Signed)
on plavix. Statin,  residual of  poor left peripheral vision

## 2021-10-29 NOTE — Assessment & Plan Note (Signed)
stable Omeprazole, FDgard helped,  saw GI, EGD 12/26/19. Vit B12 383 12/02/20, Hgb 11.8 06/07/21. GI 09/03/21, pending repeat endoscope.  

## 2021-10-29 NOTE — Assessment & Plan Note (Signed)
on Levothyroxine, TSH 1.80 12/02/20 

## 2021-10-29 NOTE — Progress Notes (Signed)
Location:   [facility]   Place of Service:  Clinic (12) Provider: Marlana Latus NP  Code Status: DNR Goals of Care: [place]    08/20/2021    9:32 AM  Advanced Directives  Does Patient Have a Medical Advance Directive? Yes  Type of Paramedic of [place];Living will  Does patient want to make changes to medical advance directive? No - Patient declined  Copy of [place] in Chart? Yes - validated most recent copy scanned in chart (See row information)     Chief Complaint  Patient presents with   Acute Visit    Patient is here for F/U for an infected surgical wound Patient is also past due for 2nd shingrix vaccine    HPI: Patient is a 88 y.o. female seen today for medical management of chronic diseases.     SCC right hand dorsum aspect, s/p Mohs 09/15/21 at dermatology specialist, Dr. Rebekah [place]? Berdine Addison? # [phone]             Anxiety, escalated, due to pending surgeries and Hx of skin melanoma, Valium available to her.               Sleeps too much during day, failed dc Ambien, Valium, refused CPAP, feels a veil in front of left eye, more noticeable left facial weakness. CT head was not done since had CT sinuses/face at [facility]  Hx of chronic rhinitis/sphenoidal sinusitis, s/p surgery, improved SOB, wheezing,  on Astelin, Flonase, Bronchodilator, underwent Pulmonology, ENT, GI, Allergy evaluation, had spirometry 10/20/20, Echo 60-65% EF 04/16/20, CTA 09/01/20. Saw ENT             Chronic sore throat: multiple factorials, GERD vs chronic rhinitis/sinusitis,  ENT  Insomnia Ambien, Valium             Hx of CVA, on plavix. Statin,  residual of  poor left peripheral vision  Hypothyroidism, on Levothyroxine, TSH 1.80 12/02/20             HTN, blood pressure is controlled on Losartan qd. Followed by Cardiology. CTA negative 09/01/20. Bun/creat 9/0.85 06/07/21            GERD, stable Omeprazole, FDgard helped,  saw GI, EGD 12/26/19. Vit B12 383 12/02/20, Hgb 11.8  06/07/21. GI 09/03/21, pending repeat endoscope.              Lactose intolerance, diet adjustment, f/u GI             Esophageal dysmotility, Swallow studay 05/12/20, worked with [place], GI f/u             OSA sleep study, saw Pulmonology.  Not using CPAP             OA, saw Ortho. Cervical spinal stenosis, MR 02/26/20, C6-C7             Hyperlipidemia, takes Pravastatin, LDL 94 05/12/21             Urinary frequency/leakage, 2-3x/night, stopped taking Gemtesa, acupuncture improved initially. Saw urology.              Osteopenia, DEXA 12/25/20, t score -1.0,  10 years possibility major fx 11%, hip fx 2.6%. Vit D, Cal-diet rich,  Vit D 3 39 12/26/20             HOH audiology eval, ENT placed tube in the right ear-failed-again, hearing aids    Past Medical History:  Diagnosis Date  Anxiety    Atypical mole 11/11/2020   Left Inner Knee (moderate-free)   Colon polyps    Depression    Hyperlipidemia    Hypertension    Hypothyroidism    Melanoma in situ ([place]) 09/19/2018   Right Arm (excision)   Skin cancer    Sleep apnea    Stroke [facility]) 2008    Past Surgical History:  Procedure Laterality Date   ADENOIDECTOMY     APPENDECTOMY  1960   CATARACT EXTRACTION Left    COLONOSCOPY     GUM SURGERY  03/2019   LUMBAR [place] SURGERY  2014   MELANOMA EXCISION  2020   MESH APPLIED TO LAP PORT     TONSILLECTOMY  1940    Allergies  Allergen Reactions   Lactose Intolerance (Gi) Other (See Comments)    indigestion   Norco [Hydrocodone-Acetaminophen] Nausea And Vomiting   Dust Mite Extract Other (See Comments)    Sinus drainage   Oxycontin [Oxycodone Hcl] Nausea And Vomiting   Pollen Extract Other (See Comments)    Sinus drainage    Allergies as of 10/29/2021       Reactions   Lactose Intolerance (gi) Other (See Comments)   indigestion   Norco [hydrocodone-acetaminophen] Nausea And Vomiting   Dust Mite Extract Other (See Comments)   Sinus drainage   Oxycontin [oxycodone Hcl] Nausea And  Vomiting   Pollen Extract Other (See Comments)   Sinus drainage        Medication List        Accurate as of October 29, 2021 11:59 PM. If you have any questions, ask your nurse or doctor.          acetaminophen 325 MG tablet Commonly known as: TYLENOL Take 650 mg by mouth every 6 (six) hours as needed for headache (pain).   azelastine 0.1 % nasal spray Commonly known as: ASTELIN Place 1 to 2 sprays each nostril 1-2 times a day as needed for runny nose/drainage down throat   Cepacol 15-2.3 MG Lozg Generic drug: Benzocaine-Menthol Use as directed 1 lozenge in the mouth or throat 3 (three) times daily as needed.   clopidogrel 75 MG tablet Commonly known as: PLAVIX TAKE 1 TABLET BY MOUTH EVERY DAY   diazepam 5 MG tablet Commonly known as: VALIUM Take 0.5 tablets (2.5 mg total) by mouth as needed (anxiety). WILL NEED APPOINTMENT FOR ADDITIONAL REFILLS   doxycycline 100 MG tablet Commonly known as: VIBRA-TABS Take 1 tablet (100 mg total) by mouth 2 (two) times daily.   FDgard 25-20.75 MG Caps Generic drug: [place] Oil-Levomenthol Take one capsule by mouth twice daily.   fluticasone 50 MCG/ACT nasal spray Commonly known as: FLONASE Place 1-2 sprays into both nostrils daily.   Gemtesa 75 MG Tabs Generic drug: Vibegron Take 75 mg by mouth at bedtime.   ketoconazole 2 % shampoo Commonly known as: NIZORAL APPLY 1 APPLICATION TOPICALLY TWICE WEEKLY AS NEEDED FOR SCALP IRRITATION.   loratadine 10 MG tablet Commonly known as: CLARITIN Take 1 tablet (10 mg total) by mouth daily as needed for up to 15 days for allergies.   losartan 50 MG tablet Commonly known as: COZAAR TAKE 1 TABLET (50 MG TOTAL) BY MOUTH DAILY. APPOINTMENT DUE PRIOR TO ADDITIONAL REFILLS   nitroGLYCERIN 0.4 MG SL tablet Commonly known as: NITROSTAT Place 1 tablet (0.4 mg total) under the tongue every 5 (five) minutes as needed for up to 25 days for chest pain.   omeprazole 40 MG  capsule Commonly  known as: PRILOSEC Take 1 capsule (40 mg total) by mouth 2 (two) times daily before lunch and supper.   pravastatin 40 MG tablet Commonly known as: PRAVACHOL TAKE 1 TABLET BY MOUTH EVERYDAY AT BEDTIME   sucralfate 1 g tablet Commonly known as: CARAFATE Take 1 tablet (1 g total) by mouth 3 (three) times daily before meals.   Synthroid 100 MCG tablet Generic drug: levothyroxine TAKE 1 TABLET BY MOUTH EVERY DAY BEFORE BREAKFAST   Systane 0.4-0.3 % Soln Generic drug: Polyethyl Glycol-Propyl Glycol Place 1 drop into both eyes every morning.   Systane 0.4-0.3 % Gel ophthalmic gel Generic drug: Polyethyl Glycol-Propyl Glycol Place 1 application. into both eyes at bedtime.   Tyrvaya 0.03 MG/ACT Soln Generic drug: Varenicline Tartrate Place 1 spray into the nose 2 (two) times daily.   Zolpidem Tartrate 3.5 MG Subl Take 0.5 tablets by mouth at bedtime as needed.        Review of Systems:  Review of Systems  Constitutional:  Negative for fatigue, fever and unexpected weight change.  HENT:  Positive for congestion, hearing loss, nosebleeds, sinus pressure and sore throat. Negative for voice change.        Chronic sinusitis, underwent ENT, sleep study-sleep apnea.   Eyes:  Positive for visual disturbance.       Left lateral visual field deficit. Dry eyes  Respiratory:  Negative for cough and shortness of breath.   Cardiovascular:  Negative for leg swelling.  Gastrointestinal:  Negative for abdominal pain and constipation.  Genitourinary:  Positive for frequency. Negative for dysuria and urgency.       Urinary leakage. Doing Kegel exercise.   Musculoskeletal:  Negative for back pain and gait problem.  Skin:  Negative for color change.       Mohs dorsum right hand, healed.   Neurological:  Negative for speech difficulty, weakness and headaches.  Psychiatric/Behavioral:  Positive for sleep disturbance. Negative for dysphoric mood. The patient is not  nervous/anxious.        Early am awake, difficulty returning asleep.     Health Maintenance  Topic Date Due   Zoster Vaccines- Shingrix (2 of 2) 02/14/2019   COVID-19 Vaccine (4 - Moderna risk series) 07/31/2020   INFLUENZA VACCINE  08/18/2021   COLONOSCOPY (Pts 45-17yr Insurance coverage will need to be confirmed)  10/25/2021   TETANUS/TDAP  04/03/2022 (Originally 11/26/2018)   Pneumonia Vaccine 88 Years old  Completed   DEXA SCAN  Completed   HPV VACCINES  Aged Out    Physical Exam: Vitals:   10/29/21 1409  BP: 110/68  Pulse: 86  Resp: 20  Temp: (!) 97.3 F (36.3 C)  SpO2: 98%  Weight: 158 lb (71.7 kg)  Height: '5\' 3"'$  (1.6 m)   Body mass index is 27.99 kg/m. Physical Exam Vitals and nursing note reviewed.  Constitutional:      Appearance: Normal appearance.  HENT:     Head: Normocephalic and atraumatic.     Ears:     Comments: Right ear tube placed, mild erythema, no bleeding.     Nose: Nose normal.     Comments: Redness right nostril, no bleed.     Mouth/Throat:     Mouth: Mucous membranes are moist.     Pharynx: No oropharyngeal exudate or posterior oropharyngeal erythema.     Comments: Right throat bruised Eyes:     Extraocular Movements: Extraocular movements intact.     Conjunctiva/sclera: Conjunctivae normal.     Pupils: Pupils are equal,  round, and reactive to light.     Comments: Left peripheral visual field deficit since CVA 2006  Cardiovascular:     Rate and Rhythm: Normal rate and regular rhythm.     Heart sounds: No murmur heard. Pulmonary:     Effort: Pulmonary effort is normal.     Breath sounds: Rales present.     Comments: Bibasilar rales.  Abdominal:     General: Bowel sounds are normal.     Palpations: Abdomen is soft.     Tenderness: There is no abdominal tenderness.  Musculoskeletal:     Cervical back: Normal range of motion and neck supple.     Right lower leg: No edema.     Left lower leg: No edema.  Skin:    General: Skin is  warm and dry.  Neurological:     General: No focal deficit present.     Mental Status: She is alert and oriented to person, place, and time. Mental status is at baseline.     Motor: Weakness present.     Gait: Gait normal.     Comments: Left facial weakness  Psychiatric:        Mood and Affect: Mood normal.        Behavior: Behavior normal.        Thought Content: Thought content normal.        Judgment: Judgment normal.     Labs reviewed: Basic Metabolic Panel: Recent Labs    12/02/20 0735 05/12/21 1153 06/06/21 0800 06/07/21 0408  NA  --  144 142 138  K  --  4.5 4.0 3.5  CL  --  106 109 107  CO2  --  '24 24 26  '$ GLUCOSE  --  79 161* 110*  BUN  --  '14 9 9  '$ CREATININE  --  0.79 0.87 0.85  CALCIUM  --  9.9 9.3 8.3*  TSH 1.80  --   --   --    Liver Function Tests: Recent Labs    06/06/21 0800 06/07/21 0408  AST 20 18  ALT 15 14  ALKPHOS 64 50  BILITOT 0.8 0.7  PROT 6.5 5.3*  ALBUMIN 3.8 2.9*   Recent Labs    06/06/21 0800  LIPASE 33   No results for input(s): "AMMONIA" in the last 8760 hours. CBC: Recent Labs    06/06/21 0800 06/07/21 0408  WBC 11.1* 6.5  NEUTROABS 9.6*  --   HGB 13.9 11.8*  HCT 42.7 35.5*  MCV 92.0 89.9  PLT 229 185   Lipid Panel: Recent Labs    05/12/21 1151  CHOL 163  HDL 44  LDLCALC 94  TRIG 144   Lab Results  Component Value Date   HGBA1C 6.1 (H) 06/07/2021    Procedures since last visit: No results found.  Assessment/Plan  Essential hypertension blood pressure is controlled on Losartan qd. Followed by Cardiology. CTA negative 09/01/20. Bun/creat 9/0.85 06/07/21  Gastroesophageal reflux disease stable Omeprazole, FDgard helped,  saw GI, EGD 12/26/19. Vit B12 383 12/02/20, Hgb 11.8 06/07/21. GI 09/03/21, pending repeat endoscope.   Lactose intolerance diet adjustment, f/u GI  Dysphagia  Esophageal dysmotility, Swallow studay 05/12/20, worked with ST, GI f/u  OSA (obstructive sleep apnea)    OSA sleep study, saw  Pulmonology.  Not using CPAP  Other spondylosis with radiculopathy, cervical region saw Ortho. Cervical spinal stenosis, MR 02/26/20, C6-C7  Hyperlipidemia  takes Pravastatin, LDL 94 05/12/21  Incontinent of urine /leakage, 2-3x/night, stopped taking [place]  acupuncture improved initially. Saw urology.   Osteopenia after menopause DEXA 12/25/20, t score -1.0,  10 years possibility major fx 11%, hip fx 2.6%. Vit D, Cal-diet rich,  Vit D 3 39 12/26/20  HOH (hard of hearing)  [facility] audiology eval, ENT placed tube in the right ear-failed-again, hearing aids  Acquired hypothyroidism  on Levothyroxine, TSH 1.80 12/02/20  History of CVA (cerebrovascular accident) on plavix. Statin,  residual of  poor left peripheral vision   Insomnia  Ambien, Valium  Chronic sore throat multiple factorials, GERD vs chronic rhinitis, ENT   Chronic sinusitis Hx of chronic rhinitis/sphenoidal sinusitis, s/p surgery, improved SOB, wheezing,  on Astelin, Flonase, Bronchodilator, underwent Pulmonology, ENT, GI, Allergy evaluation, had spirometry 10/20/20, Echo 60-65% EF 04/16/20, CTA 09/01/20. Saw ENT  Anxiety escalated, due to pending surgeries and Hx of skin melanoma, Valium available to her.    Labs/tests ordered:  none  Next appt:  12/03/2021

## 2021-10-29 NOTE — Assessment & Plan Note (Signed)
/  leakage, 2-3x/night, stopped taking Gemtesa, acupuncture improved initially. Saw urology.

## 2021-10-30 ENCOUNTER — Encounter: Payer: Self-pay | Admitting: Nurse Practitioner

## 2021-11-03 ENCOUNTER — Ambulatory Visit: Payer: Medicare HMO

## 2021-11-03 VITALS — BP 138/72 | HR 72 | Temp 98.0°F | Resp 16 | Ht 63.0 in | Wt 152.0 lb

## 2021-11-03 DIAGNOSIS — I10 Essential (primary) hypertension: Secondary | ICD-10-CM

## 2021-11-03 DIAGNOSIS — R0789 Other chest pain: Secondary | ICD-10-CM

## 2021-11-03 MED ORDER — ISOSORBIDE DINITRATE 30 MG PO TABS: 30.0000 mg | ORAL_TABLET | Freq: Three times a day (TID) | ORAL | 2 refills | Status: DC

## 2021-11-03 MED ORDER — LOSARTAN POTASSIUM 50 MG PO TABS: 50.0000 mg | ORAL_TABLET | Freq: Every day | ORAL | 3 refills | Status: DC

## 2021-11-03 MED ORDER — LOSARTAN POTASSIUM 100 MG PO TABS: 100.0000 mg | ORAL_TABLET | Freq: Every day | ORAL | 3 refills | Status: DC

## 2021-11-03 NOTE — Progress Notes (Signed)
Primary Physician/Referring:  [name], [name], NP  Patient ID: Darlene [name], female    DOB: 02/11/1933, 88 y.o.   MRN: [id number]  No chief complaint on file.  HPI:    Darlene [name]  is a 88 y.o. Caucasian female with hypertension, hyperlipidemia, prediabetes, h/o sleep apnea not on CPAP, prior history of stroke in 2006 without residual defect, on Plavix chronically. She has had frequent palpitations, difficulty in controlling hypertension, extreme anxiety, multiple medication intolerances.  However with the present combination of medications, blood pressure has been well controlled.   Patient presents for 6 month follow-up.  Last office visit patient blood pressure was uncontrolled and losartan was increased to '100mg'$  daily. She has only been taking losartan '50mg'$  daily and blood pressure continue to be elevated with home readings. Last night she checked her blood pressure and it was 162/101. She has not been taking her Plavix for approximately 2 weeks due to sinus surgery. She does still have ongoing chest pain 2-3 times per week at rest usually at night and she take sublingual nitroglycerin 2-3 times per week which resolves the chest pain. She is active and walks daily, she does not have chest pain with exertion. She denies shortness of breath, palpitations, leg edema, orthopnea, PND, syncope.   Past Medical History:  Diagnosis Date   Anxiety    Atypical mole 11/11/2020   Left Inner Knee (moderate-free)   Colon polyps    Depression    Hyperlipidemia    Hypertension    Hypothyroidism    Melanoma in situ ([place]) 09/19/2018   Right Arm (excision)   Skin cancer    Sleep apnea    Stroke ([place]) 2008   Past Surgical History:  Procedure Laterality Date   ADENOIDECTOMY     APPENDECTOMY  1960   CATARACT EXTRACTION Left    COLONOSCOPY     GUM SURGERY  03/2019   LUMBAR [place] SURGERY  2014   MELANOMA EXCISION  2020   MESH APPLIED TO LAP PORT     TONSILLECTOMY  1940   Family History  Problem  Relation Age of Onset   Lung cancer Mother 57       smoker   Heart disease Mother    Stroke Father 13   Heart attack Father    Allergic rhinitis Sister    Hypertension Sister    CVA Brother    Lung cancer Maternal Grandfather    Heart attack Paternal Grandfather    Melanoma Daughter    Psoriasis Daughter    Rheum arthritis Daughter    Bipolar disorder Daughter    Colon cancer Neg Hx    Esophageal cancer Neg Hx    Inflammatory bowel disease Neg Hx    Liver disease Neg Hx    Pancreatic cancer Neg Hx    Stomach cancer Neg Hx    Rectal cancer Neg Hx     Social History   Tobacco Use   Smoking status: Never   Smokeless tobacco: Never  Substance Use Topics   Alcohol use: Not Currently    Comment: hardly ever   ROS   Review of Systems  Constitutional: Negative for malaise/fatigue and weight gain.  Cardiovascular:  Positive for chest pain. Negative for claudication, leg swelling, near-syncope, orthopnea, palpitations, paroxysmal nocturnal dyspnea and syncope.  Respiratory:  Negative for shortness of breath.   Musculoskeletal:  Positive for arthritis, back pain, joint pain and neck pain.  Neurological:  Negative for dizziness.   Objective  Blood pressure  138/72, pulse 72, temperature 98 F (36.7 C), temperature source Temporal, resp. rate 16, height '5\' 3"'$  (1.6 m), weight 152 lb (68.9 kg), SpO2 98 %.     11/03/2021   88  AM 10/29/2021    2:09 PM 10/01/2021    3:54 PM  Vitals with BMI  Height '5\' 3"'$  '5\' 3"'$    Weight 152 lbs 158 lbs   BMI 40.08 28   Systolic 676 195 093  Diastolic 72 68 78  Pulse 72 86 66  Orthostatic VS for the past 72 hrs (Last 3 readings):  Patient Position BP Location Cuff Size  11/03/21 1026 Sitting Left Arm Normal   Physical Exam Vitals reviewed.  Constitutional:      Appearance: Normal appearance.  Cardiovascular:     Rate and Rhythm: Normal rate and regular rhythm.     Pulses: Normal pulses and intact distal pulses.          Radial pulses  are 2+ on the right side and 2+ on the left side.     Heart sounds: Normal heart sounds, S1 normal and S2 normal. No murmur heard.    No gallop.     Comments: No JVD. Pulmonary:     Effort: Pulmonary effort is normal. No respiratory distress.     Breath sounds: Normal breath sounds. No wheezing, rhonchi or rales.  Musculoskeletal:     Right lower leg: No edema.     Left lower leg: No edema.  Neurological:     Mental Status: She is alert.    Laboratory examination:      Latest Ref Rng & Units 06/07/2021    4:08 AM 06/06/2021    8:00 AM 05/12/2021   11:53 AM  CMP  Glucose 70 - 99 mg/dL 110  161  79   BUN 8 - 23 mg/dL '9  9  14   '$ Creatinine 0.44 - 1.00 mg/dL 0.85  0.87  0.79   Sodium 135 - 145 mmol/L 138  142  144   Potassium 3.5 - 5.1 mmol/L 3.5  4.0  4.5   Chloride 98 - 111 mmol/L 107  109  106   CO2 22 - 32 mmol/L '26  24  24   '$ Calcium 8.9 - 10.3 mg/dL 8.3  9.3  9.9   Total Protein 6.5 - 8.1 g/dL 5.3  6.5    Total Bilirubin 0.3 - 1.2 mg/dL 0.7  0.8    Alkaline Phos 38 - 126 U/L 50  64    AST 15 - 41 U/L 18  20    ALT 0 - 44 U/L 14  15        Latest Ref Rng & Units 06/07/2021    4:08 AM 06/06/2021    8:00 AM 08/29/2020    4:27 AM  CBC  WBC 4.0 - 10.5 K/uL 6.5  11.1  7.5   Hemoglobin 12.0 - 15.0 g/dL 11.8  13.9  13.2   Hematocrit 36.0 - 46.0 % 35.5  42.7  41.0   Platelets 150 - 400 K/uL 185  229  253    Lipid Panel     Component Value Date/Time   CHOL 163 05/12/2021 1151   TRIG 144 05/12/2021 1151   HDL 44 05/12/2021 1151   CHOLHDL 3.5 12/26/2019 1053   LDLCALC 94 05/12/2021 1151   LDLCALC 107 (H) 12/26/2019 1053   HEMOGLOBIN A1C Lab Results  Component Value Date   HGBA1C 6.1 (H) 06/07/2021   MPG 128.37 06/07/2021  TSH Recent Labs    12/02/20 0735  TSH 1.80    High-sensitivity troponin 05/16/2020: <6  Allergies   Allergies  Allergen Reactions   Lactose Intolerance (Gi) Other (See Comments)    indigestion   Norco [Hydrocodone-Acetaminophen] Nausea  And Vomiting   Dust Mite Extract Other (See Comments)    Sinus drainage   Oxycontin [Oxycodone Hcl] Nausea And Vomiting   Pollen Extract Other (See Comments)    Sinus drainage    Medications Prior to Visit:   Outpatient Medications Prior to Visit  Medication Sig Dispense Refill   acetaminophen (TYLENOL) 325 MG tablet Take 650 mg by mouth every 6 (six) hours as needed for headache (pain).     Benzocaine-Menthol (CEPACOL) 15-2.3 MG LOZG Use as directed 1 lozenge in the mouth or throat 3 (three) times daily as needed. 20 lozenge 0   clopidogrel (PLAVIX) 75 MG tablet TAKE 1 TABLET BY MOUTH EVERY DAY 90 tablet 1   diazepam (VALIUM) 5 MG tablet Take 0.5 tablets (2.5 mg total) by mouth as needed (anxiety). WILL NEED APPOINTMENT FOR ADDITIONAL REFILLS 15 tablet 0   fluticasone (FLONASE) 50 MCG/ACT nasal spray Place 1-2 sprays into both nostrils daily. 16 g 5   ketoconazole (NIZORAL) 2 % shampoo APPLY 1 APPLICATION TOPICALLY TWICE WEEKLY AS NEEDED FOR SCALP IRRITATION. 120 mL 1   nitroGLYCERIN (NITROSTAT) 0.4 MG SL tablet Place 1 tablet (0.4 mg total) under the tongue every 5 (five) minutes as needed for up to 25 days for chest pain. 25 tablet 3   omeprazole (PRILOSEC) 40 MG capsule Take 1 capsule (40 mg total) by mouth 2 (two) times daily before lunch and supper. 180 capsule 3   Polyethyl Glycol-Propyl Glycol (SYSTANE) 0.4-0.3 % GEL ophthalmic gel Place 1 application. into both eyes at bedtime.     Polyethyl Glycol-Propyl Glycol (SYSTANE) 0.4-0.3 % SOLN Place 1 drop into both eyes every morning.     pravastatin (PRAVACHOL) 40 MG tablet TAKE 1 TABLET BY MOUTH EVERYDAY AT BEDTIME 90 tablet 2   SYNTHROID 100 MCG tablet TAKE 1 TABLET BY MOUTH EVERY DAY BEFORE BREAKFAST 90 tablet 1   Varenicline Tartrate (TYRVAYA) 0.03 MG/ACT SOLN Place 1 spray into the nose 2 (two) times daily.     Zolpidem Tartrate 3.5 MG SUBL Take 0.5 tablets by mouth at bedtime as needed. 30 tablet 5   losartan (COZAAR) 50 MG tablet  TAKE 1 TABLET (50 MG TOTAL) BY MOUTH DAILY. APPOINTMENT DUE PRIOR TO ADDITIONAL REFILLS 90 tablet 1   azelastine (ASTELIN) 0.1 % nasal spray Place 1 to 2 sprays each nostril 1-2 times a day as needed for runny nose/drainage down throat (Patient not taking: Reported on 11/03/2021) 30 mL 5   [place] Oil-Levomenthol (FDGARD) 25-20.75 MG CAPS Take one capsule by mouth twice daily. (Patient not taking: Reported on 11/03/2021) 60 capsule 5   loratadine (CLARITIN) 10 MG tablet Take 1 tablet (10 mg total) by mouth daily as needed for up to 15 days for allergies. 15 tablet 0   sucralfate (CARAFATE) 1 g tablet Take 1 tablet (1 g total) by mouth 3 (three) times daily before meals. (Patient not taking: Reported on 11/03/2021) 40 tablet 0   Vibegron (GEMTESA) 75 MG TABS Take 75 mg by mouth at bedtime. (Patient not taking: Reported on 11/03/2021)     doxycycline (VIBRA-TABS) 100 MG tablet Take 1 tablet (100 mg total) by mouth 2 (two) times daily. 7 tablet 0   No facility-administered medications prior to visit.  Final Medications at End of Visit    Current Meds  Medication Sig   acetaminophen (TYLENOL) 325 MG tablet Take 650 mg by mouth every 6 (six) hours as needed for headache (pain).   Benzocaine-Menthol (CEPACOL) 15-2.3 MG LOZG Use as directed 1 lozenge in the mouth or throat 3 (three) times daily as needed.   clopidogrel (PLAVIX) 75 MG tablet TAKE 1 TABLET BY MOUTH EVERY DAY   diazepam (VALIUM) 5 MG tablet Take 0.5 tablets (2.5 mg total) by mouth as needed (anxiety). WILL NEED APPOINTMENT FOR ADDITIONAL REFILLS   fluticasone (FLONASE) 50 MCG/ACT nasal spray Place 1-2 sprays into both nostrils daily.   isosorbide dinitrate (ISORDIL) 30 MG tablet Take 1 tablet (30 mg total) by mouth 3 (three) times daily.   ketoconazole (NIZORAL) 2 % shampoo APPLY 1 APPLICATION TOPICALLY TWICE WEEKLY AS NEEDED FOR SCALP IRRITATION.   nitroGLYCERIN (NITROSTAT) 0.4 MG SL tablet Place 1 tablet (0.4 mg total) under the tongue  every 5 (five) minutes as needed for up to 25 days for chest pain.   omeprazole (PRILOSEC) 40 MG capsule Take 1 capsule (40 mg total) by mouth 2 (two) times daily before lunch and supper.   Polyethyl Glycol-Propyl Glycol (SYSTANE) 0.4-0.3 % GEL ophthalmic gel Place 1 application. into both eyes at bedtime.   Polyethyl Glycol-Propyl Glycol (SYSTANE) 0.4-0.3 % SOLN Place 1 drop into both eyes every morning.   pravastatin (PRAVACHOL) 40 MG tablet TAKE 1 TABLET BY MOUTH EVERYDAY AT BEDTIME   SYNTHROID 100 MCG tablet TAKE 1 TABLET BY MOUTH EVERY DAY BEFORE BREAKFAST   Varenicline Tartrate (TYRVAYA) 0.03 MG/ACT SOLN Place 1 spray into the nose 2 (two) times daily.   Zolpidem Tartrate 3.5 MG SUBL Take 0.5 tablets by mouth at bedtime as needed.   [DISCONTINUED] losartan (COZAAR) 100 MG tablet Take 1 tablet (100 mg total) by mouth daily.   [DISCONTINUED] losartan (COZAAR) 50 MG tablet TAKE 1 TABLET (50 MG TOTAL) BY MOUTH DAILY. APPOINTMENT DUE PRIOR TO ADDITIONAL REFILLS   Radiology:   CXR 03/11/2019: Cardiac shadow is within normal limits. Aortic calcifications are seen. The lungs are clear. No bony abnormality is noted.  MRI brain 09/20/2019: Normal.  CT angiogram head 02/08/2020: 1. No emergent finding or specific explanation for headache. 2. Remote right occipital infarction.  MR cervical spine 02/26/2020: 1. No acute osseous abnormality in the cervical spine. 2. Mild multifactorial degenerative spinal stenosis C4-C5 through C6-C7. Very mild cord mass effect but no cord signal abnormality. 3. Associated severe degenerative foraminal stenosis at the bilateral C5 nerve levels.  Up to moderate left C6 and left C7 nerve level foraminal stenosis  Cardiac Studies:   Stress Echocardiogram 2017-06-18:  Resting EKG demonstrated normal sinus rhythm. Stress EKG is negative for myocardial ischemia, patient exercised for 4:29 minutes, achieved 6.2 Mets. Normal blood pressure response. No evidence of  ischemia. Stress echocardiogram: Normal LV systolic function, no stress induced wall motion abnormality. EF 75-80%.  Nocturnal oximetry 04/18/2019: SPO2 88% for 6 minutes SPO2 less than 89% 11 minutes. Oxygen desaturation events 03/26/1940.  Desaturation index 42.  Minimum heart rate 54, maximum heart rate 89 bpm, average heart rate 60 bpm.  Percentage in bradycardia 66%.  Highest SPO2 99%, lowest SPO2 84%. Impression: Patient qualifies for nocturnal oxygen supplementation per Medicare guidelines Group 1.  Remote outpatient cardiac telemetry 04/24/2019 through 05/08/2019: Predominant rhythm is normal sinus rhythm.  Minimum heart rate 49, average heart rate 72 and maximum heart rate 195 bpm. PVC burden <1%.  There were 0 ventricular couplets and 0 ventricular runs. PAC burden <1%.  There was 1 supraventricular run of 5 beats.  No atrial fibrillation.  No heart block. 12 patient activated events correlated with sinus rhythm.  PCV MYOCARDIAL PERFUSION WO LEXISCAN 04/14/2020 Normal ECG stress. The patient exercised for 5 minutes and 0 seconds of a Bruce protocol, achieving approximately 7.03 METs.  Normal BP response. Reduced exercise tolerance. Non-limiting chest pain with exercise. Myocardial perfusion is normal. Overall LV systolic function is normal without regional wall motion abnormalities. Stress LV EF: 63%. No previous exam available for comparison. Low risk.  PCV ECHOCARDIOGRAM COMPLETE 24/23/5361 Normal LV systolic function with visual EF 60-65%. Left ventricle cavity is normal in size. Normal global wall motion. Normal diastolic filling pattern, normal LAP. Mild tricuspid regurgitation. Compared to prior study dated 04/24/2019: no significant change.  Coronary CTA 09/01/2020: 1. No acute findings in the imaged extracardiac chest. 2. Small to moderate hiatal hernia. 3. Aortic Atherosclerosis (ICD10-I70.0).   1. Total coronary calcium score of 0 AU.  2. Normal coronary origin with  left dominance.  3. Aortic atherosclerosis.  4. CAD-RADS 0: No evidence of epicardial coronary artery disease (0%). RECOMMENDATIONS: Consider non-atherosclerotic causes of chest pain.  EKG  05/08/2021: Sinus rhythm at a rate of 67 bpm.  Normal axis.  Poor R wave progression, cannot exclude anteroseptal infarct old.  Incomplete right bundle branch block.  No evidence of ischemia or underlying injury pattern.  Compared EKG 04/06/2021, no significant change.  EKG 02/07/2020: Normal sinus rhythm at rate of 74 bpm, normal axis, poor R wave progression, probably normal variant.  Low-voltage.  EKG 04/12/2019: Normal sinus rhythm with rate of 75 bpm, left atrial enlargement, normal axis.  Nonspecific anterolateral T wave normality, cannot exclude ischemia.  Compared to 12/21/2017, no significant change.  Assessment     ICD-10-CM   1. Essential hypertension  I10     2. Atypical chest pain  R07.89 PCV MYOCARDIAL PERFUSION WO LEXISCAN      Meds ordered this encounter  Medications   DISCONTD: losartan (COZAAR) 100 MG tablet    Sig: Take 1 tablet (100 mg total) by mouth daily.    Dispense:  90 tablet    Refill:  3    Order Specific Question:   Supervising Provider    Answer:   Adrian Prows [[id number]]   isosorbide dinitrate (ISORDIL) 30 MG tablet    Sig: Take 1 tablet (30 mg total) by mouth 3 (three) times daily.    Dispense:  90 tablet    Refill:  2    Order Specific Question:   Supervising Provider    Answer:   Adrian Prows [[id number]]   losartan (COZAAR) 50 MG tablet    Sig: Take 1 tablet (50 mg total) by mouth daily.    Dispense:  90 tablet    Refill:  3    Order Specific Question:   Supervising Provider    Answer:   Adrian Prows [[id number]]    Medications Discontinued During This Encounter  Medication Reason   doxycycline (VIBRA-TABS) 100 MG tablet    losartan (COZAAR) 50 MG tablet    losartan (COZAAR) 100 MG tablet      Orders Placed This Encounter  Procedures   PCV MYOCARDIAL PERFUSION WO LEXISCAN     Standing Status:   Future    Standing Expiration Date:   01/03/2022    Recommendations:   Dalissa Lovin  is a  88 y.o. Caucasian female with hypertension,  hyperlipidemia, prediabetes, h/o sleep apnea not on CPAP, prior history of stroke in 2006 without residual defect, on Plavix chronically. She has had frequent palpitations, difficulty in controlling hypertension, extreme anxiety, multiple medication intolerances.    Essential hypertension Atypical chest pain Blood pressure remains elevated occasionally with home readings. She has not been taking Losartan '100mg'$  daily as prescribed at last visit, per pt she was not aware the dose was changed. She continues to have episodes of atypical chest pain which could be a component of Prinzmetal angina. Will start isosorbide '30mg'$  three times daily, advised not to take SL nitro. If chest pain episodes do not improve or change will stop this and increase Losartan to '100mg'$  daily. Feel there is also could be a musculoskeletal etiology to chest pain. At this time will continue Losartan '50mg'$  daily since we are adding isosorbide.  EKG did not show ischemia or changes from previous EKG. Will repeat exercise stress test since chest pain episodes are more frequent.  Advised patient to restart Plavix as ordered. Will check lipid panel and BMP.  Follow-up in 4 weeks after stress test or sooner if needed.   Ernst Spell, AGNP-C 11/03/2021, 11:46 AM Office: [phone]

## 2021-11-05 ENCOUNTER — Non-Acute Institutional Stay: Payer: Medicare HMO | Admitting: Nurse Practitioner

## 2021-11-05 ENCOUNTER — Telehealth: Payer: Self-pay

## 2021-11-05 ENCOUNTER — Encounter: Payer: Self-pay | Admitting: Nurse Practitioner

## 2021-11-05 DIAGNOSIS — I10 Essential (primary) hypertension: Secondary | ICD-10-CM | POA: Diagnosis not present

## 2021-11-05 DIAGNOSIS — H9190 Unspecified hearing loss, unspecified ear: Secondary | ICD-10-CM

## 2021-11-05 DIAGNOSIS — Z8673 Personal history of transient ischemic attack (TIA), and cerebral infarction without residual deficits: Secondary | ICD-10-CM

## 2021-11-05 DIAGNOSIS — K219 Gastro-esophageal reflux disease without esophagitis: Secondary | ICD-10-CM | POA: Diagnosis not present

## 2021-11-05 DIAGNOSIS — R1319 Other dysphagia: Secondary | ICD-10-CM

## 2021-11-05 DIAGNOSIS — E782 Mixed hyperlipidemia: Secondary | ICD-10-CM

## 2021-11-05 DIAGNOSIS — G4733 Obstructive sleep apnea (adult) (pediatric): Secondary | ICD-10-CM

## 2021-11-05 DIAGNOSIS — F5101 Primary insomnia: Secondary | ICD-10-CM

## 2021-11-05 DIAGNOSIS — Z780 Asymptomatic menopausal state: Secondary | ICD-10-CM

## 2021-11-05 DIAGNOSIS — E039 Hypothyroidism, unspecified: Secondary | ICD-10-CM

## 2021-11-05 DIAGNOSIS — M159 Polyosteoarthritis, unspecified: Secondary | ICD-10-CM

## 2021-11-05 DIAGNOSIS — C439 Malignant melanoma of skin, unspecified: Secondary | ICD-10-CM

## 2021-11-05 DIAGNOSIS — J329 Chronic sinusitis, unspecified: Secondary | ICD-10-CM

## 2021-11-05 DIAGNOSIS — R32 Unspecified urinary incontinence: Secondary | ICD-10-CM

## 2021-11-05 DIAGNOSIS — E739 Lactose intolerance, unspecified: Secondary | ICD-10-CM | POA: Diagnosis not present

## 2021-11-05 DIAGNOSIS — J312 Chronic pharyngitis: Secondary | ICD-10-CM

## 2021-11-05 DIAGNOSIS — R400 Somnolence: Secondary | ICD-10-CM

## 2021-11-05 DIAGNOSIS — M8580 Other specified disorders of bone density and structure, unspecified site: Secondary | ICD-10-CM

## 2021-11-05 DIAGNOSIS — F411 Generalized anxiety disorder: Secondary | ICD-10-CM

## 2021-11-05 DIAGNOSIS — M150 Primary generalized (osteo)arthritis: Secondary | ICD-10-CM

## 2021-11-05 MED ORDER — ZOLPIDEM TARTRATE 3.5 MG SL SUBL: 1.0000 | SUBLINGUAL_TABLET | Freq: Every evening | SUBLINGUAL | 5 refills | Status: DC | PRN

## 2021-11-05 NOTE — Assessment & Plan Note (Addendum)
blood pressure is controlled on Losartan qd. added Isosorbide '30mg'$  tid 11/03/21 by Cardiology. Followed by Cardiology. CTA negative 09/01/20. Bun/creat 9/0.85 06/07/21

## 2021-11-05 NOTE — Assessment & Plan Note (Signed)
Hx of chronic rhinitis/sphenoidal sinusitis, s/p surgery, improved SOB, wheezing,  on Astelin, Flonase, Bronchodilator, underwent Pulmonology, ENT, GI, Allergy evaluation, had spirometry 10/20/20, Echo 60-65% EF 04/16/20, CTA 09/01/20. Saw ENT 

## 2021-11-05 NOTE — Assessment & Plan Note (Signed)
Sleeps too much during day, failed dc Ambien, Valium, refused CPAP, feels a veil in front of left eye, more noticeable left facial weakness. CT head was not done since had CT sinuses/face at [facility].

## 2021-11-05 NOTE — Assessment & Plan Note (Signed)
multiple factorials, GERD vs chronic rhinitis/sinusitis,  ENT

## 2021-11-05 NOTE — Assessment & Plan Note (Signed)
on plavix. Statin,  residual of  poor left peripheral vision

## 2021-11-05 NOTE — Assessment & Plan Note (Signed)
escalated, due to pending surgeries and Hx of skin melanoma, Valium available to her.

## 2021-11-05 NOTE — Assessment & Plan Note (Signed)
Urinary frequency/leakage, 2-3x/night, stopped taking Gemtesa, acupuncture improved initially. Saw urology.  

## 2021-11-05 NOTE — Progress Notes (Signed)
Location:   clinic [facility]   Place of Service:  Clinic (12) Provider: Marlana Latus NP  Code Status: DNR Goals of Care: [place]    08/20/2021    9:32 AM  Advanced Directives  Does Patient Have a Medical Advance Directive? Yes  Type of Paramedic of [place];Living will  Does patient want to make changes to medical advance directive? No - Patient declined  Copy of [place] in Chart? Yes - validated most recent copy scanned in chart (See row information)     Chief Complaint  Patient presents with   Acute Visit    Patient would like to discuss nose surgery   Quality Metric Gaps    Discuss need for updated vaccines flu,covid, and second shingrix Patient is also due for colonoscopy last one was 08/2018    HPI: Patient is a 88 y.o. female seen today for medical management of chronic diseases.      SCC right hand dorsum aspect, s/p Mohs 09/15/21 at dermatology specialist, Dr. Rebekah [place]? Berdine Addison? # [phone]             Anxiety, escalated, due to pending surgeries and Hx of skin melanoma, Valium available to her.               Sleeps too much during day, failed dc Ambien, Valium, refused CPAP, feels a veil in front of left eye, more noticeable left facial weakness. CT head was not done since had CT sinuses/face at [facility].  Hx of chronic rhinitis/sphenoidal sinusitis, s/p surgery, improved SOB, wheezing,  on Astelin, Flonase, Bronchodilator, underwent Pulmonology, ENT, GI, Allergy evaluation, had spirometry 10/20/20, Echo 60-65% EF 04/16/20, CTA 09/01/20. 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              Chronic sore throat: multiple factorials, GERD vs chronic rhinitis/sinusitis,  ENT  Insomnia Ambien, Valium             Hx of CVA, on plavix. Statin,  residual of  poor left peripheral vision  Hypothyroidism, on Levothyroxine, TSH 1.80 12/02/20             HTN, blood pressure is controlled on Losartan qd. added Imdur '30mg'$   tid by Cardiology. Followed by Cardiology. CTA negative 09/01/20. Bun/creat 9/0.85 06/07/21            GERD, stable Omeprazole, FDgard helped,  saw GI, EGD 12/26/19. Vit B12 383 12/02/20, Hgb 11.8 06/07/21. GI 09/03/21, pending repeat endoscope.              Lactose intolerance, diet adjustment, f/u GI             Esophageal dysmotility, Swallow studay 05/12/20, worked with [place], GI f/u             OSA sleep study, saw Pulmonology.  Not using CPAP               OA, saw Ortho. Cervical spinal stenosis, MR 02/26/20, C6-C7             Hyperlipidemia, takes Pravastatin, LDL 94 05/12/21             Urinary frequency/leakage, 2-3x/night, stopped taking Gemtesa, acupuncture improved initially. Saw urology.              Osteopenia, DEXA 12/25/20, t score -1.0,  10 years possibility major fx 11%, hip fx 2.6%. Vit D, Cal-diet rich,  Vit D 3 39 12/26/20  [place] audiology eval, ENT placed tube in the right ear-failed-again, hearing aids     Past Medical History:  Diagnosis Date   Anxiety    Atypical mole 11/11/2020   Left Inner Knee (moderate-free)   Colon polyps    Depression    Hyperlipidemia    Hypertension    Hypothyroidism    Melanoma in situ ([place]) 09/19/2018   Right Arm (excision)   Skin cancer    Sleep apnea    Stroke ([place]) 2008    Past Surgical History:  Procedure Laterality Date   ADENOIDECTOMY     APPENDECTOMY  1960   CATARACT EXTRACTION Left    COLONOSCOPY     GUM SURGERY  03/2019   LUMBAR [place] SURGERY  2014   MELANOMA EXCISION  2020   MESH APPLIED TO LAP PORT     TONSILLECTOMY  1940    Allergies  Allergen Reactions   Lactose Intolerance (Gi) Other (See Comments)    indigestion   Norco [Hydrocodone-Acetaminophen] Nausea And Vomiting   Dust Mite Extract Other (See Comments)    Sinus drainage   Oxycontin [Oxycodone Hcl] Nausea And Vomiting   Pollen Extract Other (See Comments)    Sinus drainage    Allergies as of 11/05/2021       Reactions   Lactose Intolerance (gi)  Other (See Comments)   indigestion   Norco [hydrocodone-acetaminophen] Nausea And Vomiting   Dust Mite Extract Other (See Comments)   Sinus drainage   Oxycontin [oxycodone Hcl] Nausea And Vomiting   Pollen Extract Other (See Comments)   Sinus drainage        Medication List        Accurate as of November 05, 2021 11:59 PM. If you have any questions, ask your nurse or doctor.          STOP taking these medications    azelastine 0.1 % nasal spray Commonly known as: ASTELIN Stopped by: Jerrik Housholder X Letina Luckett, NP   FDgard 25-20.75 MG Caps Generic drug: [place] Oil-Levomenthol Stopped by: Ethelmae Ringel X Toria Monte, NP   Gemtesa 75 MG Tabs Generic drug: Vibegron Stopped by: Javarian Jakubiak X Aamira Bischoff, NP   sucralfate 1 g tablet Commonly known as: CARAFATE Stopped by: Chereese Cilento X Django Nguyen, NP       TAKE these medications    acetaminophen 325 MG tablet Commonly known as: TYLENOL Take 650 mg by mouth every 6 (six) hours as needed for headache (pain).   Cepacol 15-2.3 MG Lozg Generic drug: Benzocaine-Menthol Use as directed 1 lozenge in the mouth or throat 3 (three) times daily as needed.   clopidogrel 75 MG tablet Commonly known as: PLAVIX TAKE 1 TABLET BY MOUTH EVERY DAY   diazepam 5 MG tablet Commonly known as: VALIUM Take 0.5 tablets (2.5 mg total) by mouth as needed (anxiety). WILL NEED APPOINTMENT FOR ADDITIONAL REFILLS   fluticasone 50 MCG/ACT nasal spray Commonly known as: FLONASE Place 1-2 sprays into both nostrils daily.   isosorbide dinitrate 30 MG tablet Commonly known as: ISORDIL Take 1 tablet (30 mg total) by mouth 3 (three) times daily.   ketoconazole 2 % shampoo Commonly known as: NIZORAL APPLY 1 APPLICATION TOPICALLY TWICE WEEKLY AS NEEDED FOR SCALP IRRITATION.   loratadine 10 MG tablet Commonly known as: CLARITIN Take 1 tablet (10 mg total) by mouth daily as needed for up to 15 days for allergies.   losartan 50 MG tablet Commonly known as: COZAAR Take 1 tablet (50 mg total) by mouth  daily.   nitroGLYCERIN  0.4 MG SL tablet Commonly known as: NITROSTAT Place 1 tablet (0.4 mg total) under the tongue every 5 (five) minutes as needed for up to 25 days for chest pain.   omeprazole 40 MG capsule Commonly known as: PRILOSEC Take 1 capsule (40 mg total) by mouth 2 (two) times daily before lunch and supper.   pravastatin 40 MG tablet Commonly known as: PRAVACHOL TAKE 1 TABLET BY MOUTH EVERYDAY AT BEDTIME   Synthroid 100 MCG tablet Generic drug: levothyroxine TAKE 1 TABLET BY MOUTH EVERY DAY BEFORE BREAKFAST   Systane 0.4-0.3 % Soln Generic drug: Polyethyl Glycol-Propyl Glycol Place 1 drop into both eyes every morning.   Systane 0.4-0.3 % Gel ophthalmic gel Generic drug: Polyethyl Glycol-Propyl Glycol Place 1 application. into both eyes at bedtime.   Tyrvaya 0.03 MG/ACT Soln Generic drug: Varenicline Tartrate Place 1 spray into the nose 2 (two) times daily.   Zolpidem Tartrate 3.5 MG Subl Take 1 tablet by mouth at bedtime as needed. What changed: how much to take Changed by: Loxley Schmale X Vickey Ewbank, NP        Review of Systems:  Review of Systems  Constitutional:  Negative for fatigue, fever and unexpected weight change.  HENT:  Positive for congestion, hearing loss and sore throat. Negative for nosebleeds, sinus pressure and voice change.        Chronic sinusitis, underwent ENT, sleep study-sleep apnea.   Eyes:  Positive for visual disturbance.       Left lateral visual field deficit. Dry eyes  Respiratory:  Negative for cough and shortness of breath.   Cardiovascular:  Negative for leg swelling.  Gastrointestinal:  Negative for abdominal pain and constipation.  Genitourinary:  Positive for frequency. Negative for dysuria and urgency.       Urinary leakage. Doing Kegel exercise.   Musculoskeletal:  Negative for back pain and gait problem.  Skin:  Negative for color change.       Mohs dorsum right hand, healed.   Neurological:  Negative for speech difficulty,  weakness and headaches.  Psychiatric/Behavioral:  Positive for sleep disturbance. Negative for dysphoric mood. The patient is not nervous/anxious.        Early am awake, difficulty returning asleep.     Health Maintenance  Topic Date Due   Zoster Vaccines- Shingrix (2 of 2) 02/14/2019   COVID-19 Vaccine (4 - Moderna risk series) 07/31/2020   INFLUENZA VACCINE  08/18/2021   COLONOSCOPY (Pts 45-32yr Insurance coverage will need to be confirmed)  10/25/2021   TETANUS/TDAP  04/03/2022 (Originally 11/26/2018)   Pneumonia Vaccine 88 Years old  Completed   DEXA SCAN  Completed   HPV VACCINES  Aged Out    Physical Exam: Vitals:   11/05/21 1510  BP: (!) 140/70  Pulse: 69  Resp: 16  Temp: (!) 97.5 F (36.4 C)  SpO2: 98%  Weight: 152 lb (68.9 kg)  Height: '5\' 3"'$  (1.6 m)   Body mass index is 26.93 kg/m. Physical Exam Vitals and nursing note reviewed.  Constitutional:      Appearance: Normal appearance.  HENT:     Head: Normocephalic and atraumatic.     Ears:     Comments: Right ear tube placed, mild erythema, no bleeding.     Nose: Nose normal.     Comments: Redness right nostril, no bleed.     Mouth/Throat:     Mouth: Mucous membranes are moist.     Pharynx: No oropharyngeal exudate or posterior oropharyngeal erythema.     Comments:  Right throat bruised Eyes:     Extraocular Movements: Extraocular movements intact.     Conjunctiva/sclera: Conjunctivae normal.     Pupils: Pupils are equal, round, and reactive to light.     Comments: Left peripheral visual field deficit since CVA 2006  Cardiovascular:     Rate and Rhythm: Normal rate and regular rhythm.     Heart sounds: No murmur heard. Pulmonary:     Effort: Pulmonary effort is normal.     Breath sounds: Rales present.     Comments: Bibasilar rales.  Abdominal:     General: Bowel sounds are normal.     Palpations: Abdomen is soft.     Tenderness: There is no abdominal tenderness.  Musculoskeletal:     Cervical back:  Normal range of motion and neck supple.     Right lower leg: No edema.     Left lower leg: No edema.  Skin:    General: Skin is warm and dry.  Neurological:     General: No focal deficit present.     Mental Status: She is alert and oriented to person, place, and time. Mental status is at baseline.     Motor: Weakness present.     Gait: Gait normal.     Comments: Left facial weakness  Psychiatric:        Mood and Affect: Mood normal.        Behavior: Behavior normal.        Thought Content: Thought content normal.        Judgment: Judgment normal.     Labs reviewed: Basic Metabolic Panel: Recent Labs    12/02/20 0735 05/12/21 1153 06/06/21 0800 06/07/21 0408  NA  --  144 142 138  K  --  4.5 4.0 3.5  CL  --  106 109 107  CO2  --  '24 24 26  '$ GLUCOSE  --  79 161* 110*  BUN  --  '14 9 9  '$ CREATININE  --  0.79 0.87 0.85  CALCIUM  --  9.9 9.3 8.3*  TSH 1.80  --   --   --    Liver Function Tests: Recent Labs    06/06/21 0800 06/07/21 0408  AST 20 18  ALT 15 14  ALKPHOS 64 50  BILITOT 0.8 0.7  PROT 6.5 5.3*  ALBUMIN 3.8 2.9*   Recent Labs    06/06/21 0800  LIPASE 33   No results for input(s): "AMMONIA" in the last 8760 hours. CBC: Recent Labs    06/06/21 0800 06/07/21 0408  WBC 11.1* 6.5  NEUTROABS 9.6*  --   HGB 13.9 11.8*  HCT 42.7 35.5*  MCV 92.0 89.9  PLT 229 185   Lipid Panel: Recent Labs    05/12/21 1151  CHOL 163  HDL 44  LDLCALC 94  TRIG 144   Lab Results  Component Value Date   HGBA1C 6.1 (H) 06/07/2021    Procedures since last visit: No results found.  Assessment/Plan  Essential hypertension blood pressure is controlled on Losartan qd. added Isosorbide '30mg'$  tid 11/03/21 by Cardiology. Followed by Cardiology. CTA negative 09/01/20. Bun/creat 9/0.85 06/07/21  Gastroesophageal reflux disease stable Omeprazole, FDgard helped,  saw GI, EGD 12/26/19. Vit B12 383 12/02/20, Hgb 11.8 06/07/21. GI 09/03/21, pending repeat endoscope.   Lactose  intolerance diet adjustment, f/u GI  Dysphagia  Swallow studay 05/12/20, worked with [place], GI f/u  OSA (obstructive sleep apnea)  saw Pulmonology.  Not using CPAP  Osteoarthritis, multiple sites  saw Ortho. Cervical spinal stenosis, MR 02/26/20, C6-C7  Hyperlipidemia takes Pravastatin, LDL 94 05/12/21  Incontinent of urine  Urinary frequency/leakage, 2-3x/night, stopped taking Gemtesa, acupuncture improved initially. Saw urology.   Osteopenia after menopause DEXA 12/25/20, t score -1.0,  10 years possibility major fx 11%, hip fx 2.6%. Vit D, Cal-diet rich,  Vit D 3 39 12/26/20  HOH (hard of hearing)  [facility] audiology eval, ENT placed tube in the right ear-failed-again, hearing aids  Acquired hypothyroidism on Levothyroxine, TSH 1.80 12/02/20  History of stroke  on plavix. Statin,  residual of  poor left peripheral vision   Insomnia Uses valium  Chronic sore throat  multiple factorials, GERD vs chronic rhinitis/sinusitis,  ENT   Chronic sinusitis Hx of chronic rhinitis/sphenoidal sinusitis, s/p surgery, improved SOB, wheezing,  on Astelin, Flonase, Bronchodilator, underwent Pulmonology, ENT, GI, Allergy evaluation, had spirometry 10/20/20, Echo 60-65% EF 04/16/20, CTA 09/01/20. Saw ENT  GAD (generalized anxiety disorder) escalated, due to pending surgeries and Hx of skin melanoma, Valium available to her.   Daytime sleepiness Sleeps too much during day, failed dc Ambien, Valium, refused CPAP, feels a veil in front of left eye, more noticeable left facial weakness. CT head was not done since had CT sinuses/face at [facility].   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    Labs/tests ordered:  none  Next appt:  12/03/2021

## 2021-11-05 NOTE — Assessment & Plan Note (Signed)
saw Pulmonology.  Not using CPAP

## 2021-11-05 NOTE — Telephone Encounter (Signed)
Patient called and stated that she has some urgent information to give you. Please call her as soon as possible.

## 2021-11-05 NOTE — Assessment & Plan Note (Signed)
Uses valium

## 2021-11-05 NOTE — Telephone Encounter (Signed)
Ok, I will call her

## 2021-11-05 NOTE — Assessment & Plan Note (Signed)
takes Pravastatin, LDL 94 05/12/21 

## 2021-11-05 NOTE — Assessment & Plan Note (Signed)
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

## 2021-11-05 NOTE — Assessment & Plan Note (Signed)
saw Ortho. Cervical spinal stenosis, MR 02/26/20, C6-C7 

## 2021-11-05 NOTE — Assessment & Plan Note (Signed)
[facility] audiology eval, ENT placed tube in the right ear-failed-again, hearing aids 

## 2021-11-05 NOTE — Assessment & Plan Note (Signed)
Swallow studay 05/12/20, worked with [place], GI f/u

## 2021-11-05 NOTE — Assessment & Plan Note (Signed)
DEXA 12/25/20, t score -1.0,  10 years possibility major fx 11%, hip fx 2.6%. Vit D, Cal-diet rich,  Vit D 3 39 12/26/20

## 2021-11-05 NOTE — Assessment & Plan Note (Signed)
on Levothyroxine, TSH 1.80 12/02/20 

## 2021-11-05 NOTE — Assessment & Plan Note (Signed)
stable Omeprazole, FDgard helped,  saw GI, EGD 12/26/19. Vit B12 383 12/02/20, Hgb 11.8 06/07/21. GI 09/03/21, pending repeat endoscope.  

## 2021-11-05 NOTE — Assessment & Plan Note (Signed)
diet adjustment, f/u GI

## 2021-11-06 ENCOUNTER — Encounter: Payer: Self-pay | Admitting: Nurse Practitioner

## 2021-11-06 ENCOUNTER — Ambulatory Visit: Payer: Medicare HMO | Admitting: Gastroenterology

## 2021-11-10 ENCOUNTER — Other Ambulatory Visit: Payer: Medicare HMO

## 2021-11-12 ENCOUNTER — Encounter: Payer: Medicare HMO | Admitting: Nurse Practitioner

## 2021-11-13 ENCOUNTER — Other Ambulatory Visit: Payer: Self-pay | Admitting: Nurse Practitioner

## 2021-12-01 ENCOUNTER — Ambulatory Visit: Payer: Medicare HMO

## 2021-12-03 ENCOUNTER — Encounter: Payer: Self-pay | Admitting: Nurse Practitioner

## 2021-12-03 ENCOUNTER — Non-Acute Institutional Stay: Payer: Medicare HMO | Admitting: Nurse Practitioner

## 2021-12-03 VITALS — BP 102/70 | HR 99 | Temp 97.2°F | Resp 18 | Ht 63.0 in | Wt 153.0 lb

## 2021-12-03 DIAGNOSIS — C439 Malignant melanoma of skin, unspecified: Secondary | ICD-10-CM | POA: Diagnosis not present

## 2021-12-03 DIAGNOSIS — Z8673 Personal history of transient ischemic attack (TIA), and cerebral infarction without residual deficits: Secondary | ICD-10-CM

## 2021-12-03 DIAGNOSIS — F419 Anxiety disorder, unspecified: Secondary | ICD-10-CM | POA: Diagnosis not present

## 2021-12-03 DIAGNOSIS — R1319 Other dysphagia: Secondary | ICD-10-CM

## 2021-12-03 DIAGNOSIS — J329 Chronic sinusitis, unspecified: Secondary | ICD-10-CM

## 2021-12-03 DIAGNOSIS — Z780 Asymptomatic menopausal state: Secondary | ICD-10-CM

## 2021-12-03 DIAGNOSIS — I10 Essential (primary) hypertension: Secondary | ICD-10-CM

## 2021-12-03 DIAGNOSIS — F5101 Primary insomnia: Secondary | ICD-10-CM

## 2021-12-03 DIAGNOSIS — J312 Chronic pharyngitis: Secondary | ICD-10-CM | POA: Diagnosis not present

## 2021-12-03 DIAGNOSIS — M5432 Sciatica, left side: Secondary | ICD-10-CM

## 2021-12-03 DIAGNOSIS — G4733 Obstructive sleep apnea (adult) (pediatric): Secondary | ICD-10-CM

## 2021-12-03 DIAGNOSIS — R32 Unspecified urinary incontinence: Secondary | ICD-10-CM

## 2021-12-03 DIAGNOSIS — E782 Mixed hyperlipidemia: Secondary | ICD-10-CM

## 2021-12-03 DIAGNOSIS — E039 Hypothyroidism, unspecified: Secondary | ICD-10-CM

## 2021-12-03 DIAGNOSIS — H9190 Unspecified hearing loss, unspecified ear: Secondary | ICD-10-CM

## 2021-12-03 DIAGNOSIS — E739 Lactose intolerance, unspecified: Secondary | ICD-10-CM

## 2021-12-03 DIAGNOSIS — K219 Gastro-esophageal reflux disease without esophagitis: Secondary | ICD-10-CM

## 2021-12-03 DIAGNOSIS — M8580 Other specified disorders of bone density and structure, unspecified site: Secondary | ICD-10-CM

## 2021-12-03 NOTE — Assessment & Plan Note (Signed)
OSA sleep study, saw Pulmonology.  Not using CPAP

## 2021-12-03 NOTE — Assessment & Plan Note (Signed)
stable Omeprazole, FDgard helped,  saw GI, EGD 12/26/19. Vit B12 383 12/02/20, Hgb 11.8 06/07/21. GI 09/03/21, pending repeat endoscope.

## 2021-12-03 NOTE — Assessment & Plan Note (Signed)
Osteopenia, DEXA 12/25/20, t score -1.0,  10 years possibility major fx 11%, hip fx 2.6%. Vit D, Cal-diet rich,  Vit D 3 39 12/26/20

## 2021-12-03 NOTE — Assessment & Plan Note (Signed)
[facility] audiology eval, ENT placed tube in the right ear-failed-again, hearing aids

## 2021-12-03 NOTE — Assessment & Plan Note (Signed)
Sleeps too much during day, failed dc Ambien, Valium, refused CPAP, feels a veil in front of left eye, more noticeable left facial weakness. CT head was not done since had CT sinuses/face at [facility].  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

## 2021-12-03 NOTE — Assessment & Plan Note (Signed)
Hx of CVA, on plavix. Statin,  residual of  poor left peripheral vision

## 2021-12-03 NOTE — Assessment & Plan Note (Signed)
on Levothyroxine, TSH 1.80 12/02/20

## 2021-12-03 NOTE — Assessment & Plan Note (Signed)
Hx of chronic rhinitis/sphenoidal sinusitis, s/p surgery, improved SOB, wheezing,  on Astelin, Flonase, Bronchodilator, underwent Pulmonology, ENT, GI, Allergy evaluation, had spirometry 10/20/20, Echo 60-65% EF 04/16/20, CTA 09/01/20. Saw ENT

## 2021-12-03 NOTE — Assessment & Plan Note (Signed)
takes Pravastatin, LDL 94 05/12/21

## 2021-12-03 NOTE — Progress Notes (Signed)
Location:   Clinic [facility]   Place of Service:  Clinic (12) Provider: Marlana Latus NP  Code Status: DNR Goals of Care: [place]    12/03/2021   10:31 AM  Advanced Directives  Does Patient Have a Medical Advance Directive? Yes  Type of Advance Directive Living will  Does patient want to make changes to medical advance directive? No - Patient declined     Chief Complaint  Patient presents with   Medical Management of Chronic Issues    Patient is here for a follow up for chronic conditions    Quality Metric Gaps    Patient is due for second shingrix, flu, covid booster, and colonoscopy    HPI: Patient is a 88 y.o. female seen today for medical management of chronic diseases.             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               Sleeps too much during day, failed dc Ambien, Valium, refused CPAP, feels a veil in front of left eye, more noticeable left facial weakness. CT head was not done since had CT sinuses[facility].  Hx of chronic rhinitis/sphenoidal sinusitis, s/p surgery, improved SOB, wheezing,  on Astelin, Flonase, Bronchodilator, underwent Pulmonology, ENT, GI, Allergy evaluation, had spirometry 10/20/20, Echo 60-65% EF 04/16/20, CTA 09/01/20. Saw ENT Incidental melanoma in nose, follows oncology             Chronic sore throat: multiple factorials, GERD vs chronic rhinitis/sinusitis,  ENT  Insomnia Ambien, Valium             Hx of CVA, on plavix. Statin,  residual of  poor left peripheral vision  Hypothyroidism, on Levothyroxine, TSH 1.80 12/02/20             HTN, blood pressure is controlled on Losartan qd. added Imdur '30mg'$  tid by Cardiology. Followed by Cardiology. CTA negative 09/01/20. Bun/creat 9/0.85 06/07/21            GERD, stable Omeprazole, FDgard helped,  saw GI, EGD 12/26/19. Vit B12 383 12/02/20, Hgb 11.8 06/07/21. GI 09/03/21, pending repeat endoscope.              Lactose intolerance, diet adjustment, f/u GI              Esophageal dysmotility, Swallow studay 05/12/20, worked with [facility], GI f/u             OSA sleep study, saw Pulmonology.  Not using CPAP                                     OA, saw Ortho. Cervical spinal stenosis, MR 02/26/20, C6-C7             Hyperlipidemia, takes Pravastatin, LDL 94 05/12/21             Urinary frequency/leakage, 2-3x/night, stopped taking Gemtesa, acupuncture improved initially. Saw urology.              Osteopenia, DEXA 12/25/20, t score -1.0,  10 years possibility major fx 11%, hip fx 2.6%. Vit D, Cal-diet rich,  Vit D 3 39 12/26/20             HOH audiology eval, ENT placed tube in the right ear-failed-again, hearing aids   Past Medical History:  Diagnosis Date   Anxiety  Atypical mole 11/11/2020   Left Inner Knee (moderate-free)   Colon polyps    Depression    Hyperlipidemia    Hypertension    Hypothyroidism    Melanoma in situ ([place]) 09/19/2018   Right Arm (excision)   Skin cancer    Sleep apnea    Stroke [facility]) 2008    Past Surgical History:  Procedure Laterality Date   ADENOIDECTOMY     APPENDECTOMY  1960   CATARACT EXTRACTION Left    COLONOSCOPY     GUM SURGERY  03/2019   LUMBAR [place] SURGERY  2014   MELANOMA EXCISION  2020   MESH APPLIED TO LAP PORT     TONSILLECTOMY  1940    Allergies  Allergen Reactions   Lactose Intolerance (Gi) Other (See Comments)    indigestion   Norco [Hydrocodone-Acetaminophen] Nausea And Vomiting   Dust Mite Extract Other (See Comments)    Sinus drainage   Oxycontin [Oxycodone Hcl] Nausea And Vomiting   Pollen Extract Other (See Comments)    Sinus drainage    Allergies as of 12/03/2021       Reactions   Lactose Intolerance (gi) Other (See Comments)   indigestion   Norco [hydrocodone-acetaminophen] Nausea And Vomiting   Dust Mite Extract Other (See Comments)   Sinus drainage   Oxycontin [oxycodone Hcl] Nausea And Vomiting   Pollen Extract Other (See Comments)   Sinus drainage        Medication List         Accurate as of December 03, 2021 11:59 PM. If you have any questions, ask your nurse or doctor.          acetaminophen 325 MG tablet Commonly known as: TYLENOL Take 650 mg by mouth every 6 (six) hours as needed for headache (pain).   Cepacol 15-2.3 MG Lozg Generic drug: Benzocaine-Menthol Use as directed 1 lozenge in the mouth or throat 3 (three) times daily as needed.   clopidogrel 75 MG tablet Commonly known as: PLAVIX TAKE 1 TABLET BY MOUTH EVERY DAY   diazepam 5 MG tablet Commonly known as: VALIUM Take 0.5 tablets (2.5 mg total) by mouth as needed (anxiety). WILL NEED APPOINTMENT FOR ADDITIONAL REFILLS   fluticasone 50 MCG/ACT nasal spray Commonly known as: FLONASE Place 1-2 sprays into both nostrils daily.   isosorbide dinitrate 30 MG tablet Commonly known as: ISORDIL Take 1 tablet (30 mg total) by mouth 3 (three) times daily.   ketoconazole 2 % shampoo Commonly known as: NIZORAL APPLY 1 APPLICATION TOPICALLY TWICE WEEKLY AS NEEDED FOR SCALP IRRITATION.   loratadine 10 MG tablet Commonly known as: CLARITIN Take 1 tablet (10 mg total) by mouth daily as needed for up to 15 days for allergies.   losartan 50 MG tablet Commonly known as: COZAAR Take 1 tablet (50 mg total) by mouth daily.   nitroGLYCERIN 0.4 MG SL tablet Commonly known as: NITROSTAT Place 1 tablet (0.4 mg total) under the tongue every 5 (five) minutes as needed for up to 25 days for chest pain.   omeprazole 40 MG capsule Commonly known as: PRILOSEC Take 1 capsule (40 mg total) by mouth 2 (two) times daily before lunch and supper.   pravastatin 40 MG tablet Commonly known as: PRAVACHOL TAKE 1 TABLET BY MOUTH EVERYDAY AT BEDTIME   Synthroid 100 MCG tablet Generic drug: levothyroxine TAKE 1 TABLET BY MOUTH EVERY DAY BEFORE BREAKFAST   Systane 0.4-0.3 % Soln Generic drug: Polyethyl Glycol-Propyl Glycol Place 1 drop into both eyes  every morning.   Systane 0.4-0.3 % Gel ophthalmic  gel Generic drug: Polyethyl Glycol-Propyl Glycol Place 1 application. into both eyes at bedtime.   Tyrvaya 0.03 MG/ACT Soln Generic drug: Varenicline Tartrate Place 1 spray into the nose 2 (two) times daily.   Zolpidem Tartrate 3.5 MG Subl Take 1 tablet by mouth at bedtime as needed.        Review of Systems:  Review of Systems  Constitutional:  Positive for fatigue. Negative for fever.  HENT:  Positive for congestion, hearing loss and sore throat. Negative for nosebleeds, sinus pressure and voice change.        Chronic sinusitis, underwent ENT, sleep study-sleep apnea.   Eyes:  Positive for visual disturbance.       Left lateral visual field deficit. Dry eyes  Respiratory:  Negative for cough and shortness of breath.   Cardiovascular:  Negative for leg swelling.  Gastrointestinal:  Negative for abdominal pain and constipation.  Genitourinary:  Positive for frequency. Negative for dysuria and urgency.       Urinary leakage. Doing Kegel exercise.   Musculoskeletal:  Negative for back pain and gait problem.  Skin:  Negative for color change.       Mohs dorsum right hand, healed.   Neurological:  Negative for speech difficulty, weakness and headaches.  Psychiatric/Behavioral:  Positive for sleep disturbance. Negative for dysphoric mood. The patient is not nervous/anxious.        Early am awake, difficulty returning asleep.     Health Maintenance  Topic Date Due   Zoster Vaccines- Shingrix (2 of 2) 02/14/2019   COVID-19 Vaccine (4 - Moderna risk series) 07/31/2020   INFLUENZA VACCINE  08/18/2021   COLONOSCOPY (Pts 45-61yr Insurance coverage will need to be confirmed)  10/25/2021   Medicare Annual Wellness (AWV)  03/05/2022   Pneumonia Vaccine 88 Years old  Completed   DEXA SCAN  Completed   HPV VACCINES  Aged Out    Physical Exam: Vitals:   12/03/21 1633  BP: 102/70  Pulse: 99  Resp: 18  Temp: (!) 97.2 F (36.2 C)  SpO2: 99%  Weight: 153 lb (69.4 kg)  Height: 5'  3" (1.6 m)   Body mass index is 27.1 kg/m. Physical Exam Vitals and nursing note reviewed.  Constitutional:      Appearance: Normal appearance.  HENT:     Head: Normocephalic and atraumatic.     Ears:     Comments: Right ear tube placed, mild erythema, no bleeding.     Nose: Nose normal.     Comments: Redness right nostril, no bleed.     Mouth/Throat:     Mouth: Mucous membranes are moist.     Pharynx: No oropharyngeal exudate or posterior oropharyngeal erythema.     Comments: Right throat bruised Eyes:     Extraocular Movements: Extraocular movements intact.     Conjunctiva/sclera: Conjunctivae normal.     Pupils: Pupils are equal, round, and reactive to light.     Comments: Left peripheral visual field deficit since CVA 2006  Cardiovascular:     Rate and Rhythm: Normal rate and regular rhythm.     Heart sounds: No murmur heard. Pulmonary:     Effort: Pulmonary effort is normal.     Breath sounds: Rales present.     Comments: Bibasilar rales.  Abdominal:     General: Bowel sounds are normal.     Palpations: Abdomen is soft.     Tenderness: There is no abdominal tenderness.  Musculoskeletal:     Cervical back: Normal range of motion and neck supple.     Right lower leg: No edema.     Left lower leg: No edema.  Skin:    General: Skin is warm and dry.  Neurological:     General: No focal deficit present.     Mental Status: She is alert and oriented to person, place, and time. Mental status is at baseline.     Motor: Weakness present.     Gait: Gait normal.     Comments: Left facial weakness  Psychiatric:        Mood and Affect: Mood normal.        Behavior: Behavior normal.        Thought Content: Thought content normal.        Judgment: Judgment normal.     Labs reviewed: Basic Metabolic Panel: Recent Labs    05/12/21 1153 06/06/21 0800 06/07/21 0408  NA 144 142 138  K 4.5 4.0 3.5  CL 106 109 107  CO2 '24 24 26  '$ GLUCOSE 79 161* 110*  BUN '14 9 9   '$ CREATININE 0.79 0.87 0.85  CALCIUM 9.9 9.3 8.3*   Liver Function Tests: Recent Labs    06/06/21 0800 06/07/21 0408  AST 20 18  ALT 15 14  ALKPHOS 64 50  BILITOT 0.8 0.7  PROT 6.5 5.3*  ALBUMIN 3.8 2.9*   Recent Labs    06/06/21 0800  LIPASE 33   No results for input(s): "AMMONIA" in the last 8760 hours. CBC: Recent Labs    06/06/21 0800 06/07/21 0408  WBC 11.1* 6.5  NEUTROABS 9.6*  --   HGB 13.9 11.8*  HCT 42.7 35.5*  MCV 92.0 89.9  PLT 229 185   Lipid Panel: Recent Labs    05/12/21 1151  CHOL 163  HDL 44  LDLCALC 94  TRIG 144   Lab Results  Component Value Date   HGBA1C 6.1 (H) 06/07/2021    Procedures since last visit: No results found.  Assessment/Plan  Anxiety Sleeps too much during day, failed dc Ambien, Valium, refused CPAP, feels a veil in front of left eye, more noticeable left facial weakness. CT head was not done since had CT sinuses/face at [facility].  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   Chronic sinusitis Hx of chronic rhinitis/sphenoidal sinusitis, s/p surgery, improved SOB, wheezing,  on Astelin, Flonase, Bronchodilator, underwent Pulmonology, ENT, GI, Allergy evaluation, had spirometry 10/20/20, Echo 60-65% EF 04/16/20, CTA 09/01/20. 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 Statin,  residual of  poor left peripheral vision   Acquired hypothyroidism  on Levothyroxine, TSH 1.80 12/02/20  Essential hypertension blood pressure is controlled on Losartan qd. added Imdur '30mg'$  tid by Cardiology. Followed by Cardiology. CTA negative 09/01/20. Bun/creat 9/0.85 06/07/21  Gastroesophageal reflux disease stable Omeprazole, FDgard helped,  saw GI, EGD 12/26/19. Vit B12 383 12/02/20, Hgb 11.8  06/07/21. GI 09/03/21, pending repeat endoscope.   Lactose intolerance Lactose intolerance, diet adjustment, f/u GI  Dysphagia  Esophageal dysmotility, Swallow studay 05/12/20, worked with ST, GI f/u  OSA (obstructive sleep apnea) OSA sleep study, saw Pulmonology.  Not using CPAP               Back pain with  left-sided sciatica  saw Ortho. Cervical spinal stenosis, MR 02/26/20, C6-C7  Hyperlipidemia takes Pravastatin, LDL 94 05/12/21  Incontinent of urine Urinary frequency/leakage, 2-3x/night, stopped taking Gemtesa, acupuncture improved initially. Saw urology.   Osteopenia after menopause  Osteopenia, DEXA 12/25/20, t score -1.0,  10 years possibility major fx 11%, hip fx 2.6%. Vit D, Cal-diet rich,  Vit D 3 39 12/26/20  HOH (hard of hearing) HOH audiology eval, ENT placed tube in the right ear-failed-again, hearing aids   Labs/tests ordered:  none  Next appt:  3 months.

## 2021-12-03 NOTE — Assessment & Plan Note (Signed)
Lactose intolerance, diet adjustment, f/u GI

## 2021-12-03 NOTE — Assessment & Plan Note (Signed)
blood pressure is controlled on Losartan qd. added Imdur '30mg'$  tid by Cardiology. Followed by Cardiology. CTA negative 09/01/20. Bun/creat 9/0.85 06/07/21

## 2021-12-03 NOTE — Assessment & Plan Note (Signed)
saw Ortho. Cervical spinal stenosis, MR 02/26/20, C6-C7

## 2021-12-03 NOTE — Assessment & Plan Note (Signed)
Insomnia Ambien, Valium

## 2021-12-03 NOTE — Assessment & Plan Note (Signed)
Chronic sore throat: multiple factorials, GERD vs chronic rhinitis/sinusitis,  ENT

## 2021-12-03 NOTE — Assessment & Plan Note (Signed)
Urinary frequency/leakage, 2-3x/night, stopped taking Gemtesa, acupuncture improved initially. Saw urology.

## 2021-12-03 NOTE — Assessment & Plan Note (Signed)
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

## 2021-12-03 NOTE — Assessment & Plan Note (Signed)
Esophageal dysmotility, Swallow studay 05/12/20, worked with [place], GI f/u

## 2021-12-04 ENCOUNTER — Encounter: Payer: Self-pay | Admitting: Nurse Practitioner

## 2021-12-14 ENCOUNTER — Other Ambulatory Visit: Payer: Medicare HMO

## 2021-12-14 ENCOUNTER — Telehealth: Payer: Self-pay | Admitting: Gastroenterology

## 2021-12-14 ENCOUNTER — Telehealth: Payer: Self-pay

## 2021-12-14 NOTE — Telephone Encounter (Signed)
Patient called to cancel stress test today due to ongoing caner treatments. BS said this was fine.

## 2021-12-14 NOTE — Telephone Encounter (Signed)
Inbound call from patient stating that she needed to change her appointment with Dr. Rush Landmark on 12/20. Patient stated that she will be having an infusion that day and needed to move around the appointment. I advised patient that the soonest that we had would be January 9th. Patient stated " NO I can not wait that long to be seen I don't think you understand". I advised her that's all I had and she then requested to speak to the nurse and that she could not wait all day for a call and asked that I mark it as urgent.  Patient is requesting a call back to discuss. Please advise.

## 2021-12-14 NOTE — Telephone Encounter (Signed)
I spoke with the pt and she has been advised that the next available appt is January.  She asked to be kept on for 12/20 and she will try to move the infusion appt.

## 2021-12-21 ENCOUNTER — Ambulatory Visit: Payer: Medicare HMO

## 2021-12-21 VITALS — BP 140/72 | HR 72 | Resp 16 | Ht 63.0 in | Wt 155.6 lb

## 2021-12-21 DIAGNOSIS — I10 Essential (primary) hypertension: Secondary | ICD-10-CM

## 2021-12-21 DIAGNOSIS — I201 Angina pectoris with documented spasm: Secondary | ICD-10-CM

## 2021-12-21 DIAGNOSIS — E7800 Pure hypercholesterolemia, unspecified: Secondary | ICD-10-CM

## 2021-12-21 MED ORDER — NITROGLYCERIN 0.4 MG SL SUBL: 0.4000 mg | SUBLINGUAL_TABLET | SUBLINGUAL | 3 refills | Status: AC | PRN

## 2021-12-21 NOTE — Progress Notes (Signed)
Primary Physician/Referring:  [name], [name], NP  Patient ID: Darlene [name], female    DOB: 07/12/1933, 88 y.o.   MRN: [id number]  Chief Complaint  Patient presents with   Chest Pain   Hypertension    HPI:    Darlene [name]  is a 88 y.o. Caucasian female with hypertension, hyperlipidemia, prediabetes, h/o sleep apnea not on CPAP, prior history of stroke in 2006 without residual defect, on Plavix chronically. She has had frequent palpitations, difficulty in controlling hypertension, extreme anxiety, multiple medication intolerances.  However with the present combination of medications, blood pressure has been well controlled.   She presents today for 4 weeks follow-up. Since last visit she has been diagnosed with right nasal mucosa melanoma and will undergo chemotherapy infusions for 6 months. At last office visit she had complaints of ongoing chest pain 2-3 times per week at rest usually at night and she was taking sublingual nitroglycerin 2-3 times per week which resolves the chest pain. She was scheduled for nuclear exercise stress test but canceled this given melanoma diagnosis. She still has occasional episode of chest discomfort that has not changes or worsened. She has taken sublingual nitroglycerin once since last visit. She has restarted Plavix. She does not want to undergo further cardiac testing at this time until chemotherapy has been completed. She is active and walks daily at her independent living facility, she does not have chest pain with exertion. She denies shortness of breath, palpitations, leg edema, orthopnea, PND, syncope.   Past Medical History:  Diagnosis Date   Anxiety    Atypical mole 11/11/2020   Left Inner Knee (moderate-free)   Colon polyps    Depression    Hyperlipidemia    Hypertension    Hypothyroidism    Melanoma in situ ([place]) 09/19/2018   Right Arm (excision)   Skin cancer    Sleep apnea    Stroke ([place]) 2008   Past Surgical History:  Procedure  Laterality Date   ADENOIDECTOMY     APPENDECTOMY  1960   CATARACT EXTRACTION Left    COLONOSCOPY     GUM SURGERY  03/2019   LUMBAR [place] SURGERY  2014   MELANOMA EXCISION  2020   MESH APPLIED TO LAP PORT     TONSILLECTOMY  1940   Family History  Problem Relation Age of Onset   Lung cancer Mother 58       smoker   Heart disease Mother    Stroke Father 52   Heart attack Father    Allergic rhinitis Sister    Hypertension Sister    CVA Brother    Lung cancer Maternal Grandfather    Heart attack Paternal Grandfather    Melanoma Daughter    Psoriasis Daughter    Rheum arthritis Daughter    Bipolar disorder Daughter    Colon cancer Neg Hx    Esophageal cancer Neg Hx    Inflammatory bowel disease Neg Hx    Liver disease Neg Hx    Pancreatic cancer Neg Hx    Stomach cancer Neg Hx    Rectal cancer Neg Hx     Social History   Tobacco Use   Smoking status: Never   Smokeless tobacco: Never  Substance Use Topics   Alcohol use: Not Currently    Comment: hardly ever   ROS   Review of Systems  Constitutional: Negative for malaise/fatigue and weight gain.  Cardiovascular:  Positive for chest pain. Negative for claudication, leg swelling, near-syncope, orthopnea, palpitations,  paroxysmal nocturnal dyspnea and syncope.  Respiratory:  Negative for shortness of breath.   Neurological:  Negative for dizziness.   Objective  Blood pressure (!) 140/72, pulse 72, resp. rate 16, height '5\' 3"'$  (1.6 m), weight 155 lb 9.6 oz (70.6 kg), SpO2 96 %.     12/21/2021   11:53 AM 12/21/2021   11:10 AM 12/03/2021    4:33 PM  Vitals with BMI  Height  '5\' 3"'$  '5\' 3"'$   Weight  155 lbs 10 oz 153 lbs  BMI  50.27 74.12  Systolic 878 676 720  Diastolic 72 70 70  Pulse  72 99  Orthostatic VS for the past 72 hrs (Last 3 readings):  Patient Position BP Location Cuff Size  12/21/21 1110 Sitting Left Arm Normal   Physical Exam Vitals reviewed.  Neck:     Vascular: No carotid bruit or JVD.   Cardiovascular:     Rate and Rhythm: Normal rate and regular rhythm.     Pulses: Normal pulses and intact distal pulses.          Radial pulses are 2+ on the right side and 2+ on the left side.     Heart sounds: Normal heart sounds, S1 normal and S2 normal. No murmur heard.    No gallop.  Pulmonary:     Effort: Pulmonary effort is normal. No respiratory distress.     Breath sounds: Normal breath sounds. No wheezing, rhonchi or rales.  Musculoskeletal:     Right lower leg: No edema.     Left lower leg: No edema.    Laboratory examination:      Latest Ref Rng & Units 06/07/2021    4:08 AM 06/06/2021    8:00 AM 05/12/2021   11:53 AM  CMP  Glucose 70 - 99 mg/dL 110  161  79   BUN 8 - 23 mg/dL '9  9  14   '$ Creatinine 0.44 - 1.00 mg/dL 0.85  0.87  0.79   Sodium 135 - 145 mmol/L 138  142  144   Potassium 3.5 - 5.1 mmol/L 3.5  4.0  4.5   Chloride 98 - 111 mmol/L 107  109  106   CO2 22 - 32 mmol/L '26  24  24   '$ Calcium 8.9 - 10.3 mg/dL 8.3  9.3  9.9   Total Protein 6.5 - 8.1 g/dL 5.3  6.5    Total Bilirubin 0.3 - 1.2 mg/dL 0.7  0.8    Alkaline Phos 38 - 126 U/L 50  64    AST 15 - 41 U/L 18  20    ALT 0 - 44 U/L 14  15        Latest Ref Rng & Units 06/07/2021    4:08 AM 06/06/2021    8:00 AM 08/29/2020    4:27 AM  CBC  WBC 4.0 - 10.5 K/uL 6.5  11.1  7.5   Hemoglobin 12.0 - 15.0 g/dL 11.8  13.9  13.2   Hematocrit 36.0 - 46.0 % 35.5  42.7  41.0   Platelets 150 - 400 K/uL 185  229  253    Lipid Panel     Component Value Date/Time   CHOL 163 05/12/2021 1151   TRIG 144 05/12/2021 1151   HDL 44 05/12/2021 1151   CHOLHDL 3.5 12/26/2019 1053   LDLCALC 94 05/12/2021 1151   LDLCALC 107 (H) 12/26/2019 1053   HEMOGLOBIN A1C Lab Results  Component Value Date   HGBA1C 6.1 (H) 06/07/2021  MPG 128.37 06/07/2021   TSH No results for input(s): "TSH" in the last 8760 hours.   High-sensitivity troponin 05/16/2020: <6  Allergies   Allergies  Allergen Reactions   Lactose Intolerance  (Gi) Other (See Comments)    indigestion   Norco [Hydrocodone-Acetaminophen] Nausea And Vomiting   Dust Mite Extract Other (See Comments)    Sinus drainage   Oxycontin [Oxycodone Hcl] Nausea And Vomiting   Pollen Extract Other (See Comments)    Sinus drainage    Medications Prior to Visit:   Outpatient Medications Prior to Visit  Medication Sig Dispense Refill   acetaminophen (TYLENOL) 325 MG tablet Take 650 mg by mouth every 6 (six) hours as needed for headache (pain).     Benzocaine-Menthol (CEPACOL) 15-2.3 MG LOZG Use as directed 1 lozenge in the mouth or throat 3 (three) times daily as needed. 20 lozenge 0   clopidogrel (PLAVIX) 75 MG tablet TAKE 1 TABLET BY MOUTH EVERY DAY 90 tablet 1   isosorbide dinitrate (ISORDIL) 30 MG tablet Take 1 tablet (30 mg total) by mouth 3 (three) times daily. 90 tablet 2   ketoconazole (NIZORAL) 2 % shampoo APPLY 1 APPLICATION TOPICALLY TWICE WEEKLY AS NEEDED FOR SCALP IRRITATION. 120 mL 1   levothyroxine (SYNTHROID) 100 MCG tablet TAKE 1 TABLET BY MOUTH EVERY DAY BEFORE BREAKFAST 90 tablet 3   losartan (COZAAR) 50 MG tablet Take 1 tablet (50 mg total) by mouth daily. 90 tablet 3   nitroGLYCERIN (NITROSTAT) 0.4 MG SL tablet Place 1 tablet (0.4 mg total) under the tongue every 5 (five) minutes as needed for up to 25 days for chest pain. 25 tablet 3   omeprazole (PRILOSEC) 40 MG capsule Take 1 capsule (40 mg total) by mouth 2 (two) times daily before lunch and supper. (Patient taking differently: Take 40 mg by mouth daily.) 180 capsule 3   Polyethyl Glycol-Propyl Glycol (SYSTANE) 0.4-0.3 % GEL ophthalmic gel Place 1 application. into both eyes at bedtime.     pravastatin (PRAVACHOL) 40 MG tablet TAKE 1 TABLET BY MOUTH EVERYDAY AT BEDTIME 90 tablet 2   Varenicline Tartrate (TYRVAYA) 0.03 MG/ACT SOLN Place 1 spray into the nose 2 (two) times daily.     Zolpidem Tartrate 3.5 MG SUBL Take 1 tablet by mouth at bedtime as needed. 30 tablet 5   diazepam (VALIUM) 5  MG tablet Take 0.5 tablets (2.5 mg total) by mouth as needed (anxiety). WILL NEED APPOINTMENT FOR ADDITIONAL REFILLS (Patient not taking: Reported on 12/21/2021) 15 tablet 0   fluticasone (FLONASE) 50 MCG/ACT nasal spray Place 1-2 sprays into both nostrils daily. 16 g 5   loratadine (CLARITIN) 10 MG tablet Take 1 tablet (10 mg total) by mouth daily as needed for up to 15 days for allergies. 15 tablet 0   Polyethyl Glycol-Propyl Glycol (SYSTANE) 0.4-0.3 % SOLN Place 1 drop into both eyes every morning.     No facility-administered medications prior to visit.   Final Medications at End of Visit    Current Meds  Medication Sig   acetaminophen (TYLENOL) 325 MG tablet Take 650 mg by mouth every 6 (six) hours as needed for headache (pain).   Benzocaine-Menthol (CEPACOL) 15-2.3 MG LOZG Use as directed 1 lozenge in the mouth or throat 3 (three) times daily as needed.   clopidogrel (PLAVIX) 75 MG tablet TAKE 1 TABLET BY MOUTH EVERY DAY   isosorbide dinitrate (ISORDIL) 30 MG tablet Take 1 tablet (30 mg total) by mouth 3 (three) times daily.  ketoconazole (NIZORAL) 2 % shampoo APPLY 1 APPLICATION TOPICALLY TWICE WEEKLY AS NEEDED FOR SCALP IRRITATION.   levothyroxine (SYNTHROID) 100 MCG tablet TAKE 1 TABLET BY MOUTH EVERY DAY BEFORE BREAKFAST   losartan (COZAAR) 50 MG tablet Take 1 tablet (50 mg total) by mouth daily.   nitroGLYCERIN (NITROSTAT) 0.4 MG SL tablet Place 1 tablet (0.4 mg total) under the tongue every 5 (five) minutes as needed for up to 25 days for chest pain.   omeprazole (PRILOSEC) 40 MG capsule Take 1 capsule (40 mg total) by mouth 2 (two) times daily before lunch and supper. (Patient taking differently: Take 40 mg by mouth daily.)   Polyethyl Glycol-Propyl Glycol (SYSTANE) 0.4-0.3 % GEL ophthalmic gel Place 1 application. into both eyes at bedtime.   pravastatin (PRAVACHOL) 40 MG tablet TAKE 1 TABLET BY MOUTH EVERYDAY AT BEDTIME   Varenicline Tartrate (TYRVAYA) 0.03 MG/ACT SOLN Place 1  spray into the nose 2 (two) times daily.   Zolpidem Tartrate 3.5 MG SUBL Take 1 tablet by mouth at bedtime as needed.   Radiology:   CXR 03/11/2019: Cardiac shadow is within normal limits. Aortic calcifications are seen. The lungs are clear. No bony abnormality is noted.  MRI brain 09/20/2019: Normal.  CT angiogram head 02/08/2020: 1. No emergent finding or specific explanation for headache. 2. Remote right occipital infarction.  MR cervical spine 02/26/2020: 1. No acute osseous abnormality in the cervical spine. 2. Mild multifactorial degenerative spinal stenosis C4-C5 through C6-C7. Very mild cord mass effect but no cord signal abnormality. 3. Associated severe degenerative foraminal stenosis at the bilateral C5 nerve levels.  Up to moderate left C6 and left C7 nerve level foraminal stenosis  Cardiac Studies:   Stress Echocardiogram Jun 16, 2017:  Resting EKG demonstrated normal sinus rhythm. Stress EKG is negative for myocardial ischemia, patient exercised for 4:29 minutes, achieved 6.2 Mets. Normal blood pressure response. No evidence of ischemia. Stress echocardiogram: Normal LV systolic function, no stress induced wall motion abnormality. EF 75-80%.  Nocturnal oximetry 04/18/2019: SPO2 88% for 6 minutes SPO2 less than 89% 11 minutes. Oxygen desaturation events 03/26/1940.  Desaturation index 42.  Minimum heart rate 54, maximum heart rate 89 bpm, average heart rate 60 bpm.  Percentage in bradycardia 66%.  Highest SPO2 99%, lowest SPO2 84%. Impression: Patient qualifies for nocturnal oxygen supplementation per Medicare guidelines Group 1.  Remote outpatient cardiac telemetry 04/24/2019 through 05/08/2019: Predominant rhythm is normal sinus rhythm.  Minimum heart rate 49, average heart rate 72 and maximum heart rate 195 bpm. PVC burden <1%.  There were 0 ventricular couplets and 0 ventricular runs. PAC burden <1%.  There was 1 supraventricular run of 5 beats.  No atrial  fibrillation.  No heart block. 12 patient activated events correlated with sinus rhythm.  PCV MYOCARDIAL PERFUSION WO LEXISCAN 04/14/2020 Normal ECG stress. The patient exercised for 5 minutes and 0 seconds of a Bruce protocol, achieving approximately 7.03 METs.  Normal BP response. Reduced exercise tolerance. Non-limiting chest pain with exercise. Myocardial perfusion is normal. Overall LV systolic function is normal without regional wall motion abnormalities. Stress LV EF: 63%. No previous exam available for comparison. Low risk.  PCV ECHOCARDIOGRAM COMPLETE 35/00/9381 Normal LV systolic function with visual EF 60-65%. Left ventricle cavity is normal in size. Normal global wall motion. Normal diastolic filling pattern, normal LAP. Mild tricuspid regurgitation. Compared to prior study dated 04/24/2019: no significant change.  Coronary CTA 09/01/2020: 1. No acute findings in the imaged extracardiac chest. 2. Small to moderate hiatal hernia.  3. Aortic Atherosclerosis (ICD10-I70.0).   1. Total coronary calcium score of 0 AU.  2. Normal coronary origin with left dominance.  3. Aortic atherosclerosis.  4. CAD-RADS 0: No evidence of epicardial coronary artery disease (0%). RECOMMENDATIONS: Consider non-atherosclerotic causes of chest pain.  EKG  05/08/2021: Sinus rhythm at a rate of 67 bpm.  Normal axis.  Poor R wave progression, cannot exclude anteroseptal infarct old.  Incomplete right bundle branch block.  No evidence of ischemia or underlying injury pattern.  Compared EKG 04/06/2021, no significant change.  EKG 02/07/2020: Normal sinus rhythm at rate of 74 bpm, normal axis, poor R wave progression, probably normal variant.  Low-voltage.  EKG 04/12/2019: Normal sinus rhythm with rate of 75 bpm, left atrial enlargement, normal axis.  Nonspecific anterolateral T wave normality, cannot exclude ischemia.  Compared to 12/21/2017, no significant change.  Assessment     ICD-10-CM   1. Pure  hypercholesterolemia  E78.00       No orders of the defined types were placed in this encounter.   Medications Discontinued During This Encounter  Medication Reason   loratadine (CLARITIN) 10 MG tablet    Polyethyl Glycol-Propyl Glycol (SYSTANE) 0.4-0.3 % SOLN    fluticasone (FLONASE) 50 MCG/ACT nasal spray      No orders of the defined types were placed in this encounter.   Recommendations:   Darlene [name]  is a  87 y.o. Caucasian female with hypertension, hyperlipidemia, prediabetes, h/o sleep apnea not on CPAP, prior history of stroke in 2006 without residual defect, on Plavix chronically. She has had frequent palpitations, difficulty in controlling hypertension, extreme anxiety, multiple medication intolerances.    Prinzmetal's angina (HCC) Blood pressure remains elevated occasionally with home readings. She has not been taking Losartan '100mg'$  daily as prescribed at last visit, per pt she was not aware the dose was changed. She continues to have episodes of atypical chest pain which could be a component of Prinzmetal angina. Continues on isosorbide '30mg'$  three times daily, and does take SL nitro as needed.  Continues on Plavix without bleeding diathesis. Feel there is also could be a musculoskeletal etiology to chest pain. Will repeat stress test after chemotherapy treatments. Advised patient that is chest pain worsens or changes to contact office.  Primary hypertension Blood pressure is slightly elevated at today's visit. She does monitor her blood pressure at home and readings have been controlled. Continue current medications without changes.  Pure hypercholesterolemia Continues pravastatin '40mg'$  daily without myalgias. Will repeat lipid profile testing at patients convenience.  Follow-up in 3 months or sooner if needed.   Ernst Spell, AGNP-C 12/21/2021, 11:53 AM Office: [phone]

## 2021-12-24 NOTE — Telephone Encounter (Signed)
Patient called stating that she needed to still move her appointment on 12/20 and wanted to know if there had been any canceled appointments. I advised patient that the 20th is the soonest that we have. Patient stated she wanted to speak to the nurse and is requesting a call at 12:30 due to her phone not working correctly. Please advise.

## 2021-12-24 NOTE — Telephone Encounter (Signed)
The pt has been advised that we have had no sooner appts- we will call her if there are any openings.  She wishes to keep the appt as planned for now.  She will call back if she has any further questions.

## 2022-01-06 ENCOUNTER — Ambulatory Visit: Payer: Medicare HMO | Admitting: Gastroenterology

## 2022-01-12 ENCOUNTER — Encounter: Payer: Self-pay | Admitting: Internal Medicine

## 2022-01-12 DIAGNOSIS — E7800 Pure hypercholesterolemia, unspecified: Secondary | ICD-10-CM

## 2022-01-12 MED ORDER — PRAVASTATIN SODIUM 40 MG PO TABS: 40.0000 mg | ORAL_TABLET | Freq: Every day | ORAL | 2 refills | Status: DC

## 2022-01-13 ENCOUNTER — Other Ambulatory Visit: Payer: Self-pay

## 2022-01-13 DIAGNOSIS — E7800 Pure hypercholesterolemia, unspecified: Secondary | ICD-10-CM

## 2022-01-13 MED ORDER — PRAVASTATIN SODIUM 40 MG PO TABS: 40.0000 mg | ORAL_TABLET | Freq: Every day | ORAL | 2 refills | Status: DC

## 2022-01-14 ENCOUNTER — Telehealth: Payer: Self-pay

## 2022-01-14 ENCOUNTER — Encounter: Payer: Medicare HMO | Admitting: Nurse Practitioner

## 2022-01-14 NOTE — Telephone Encounter (Signed)
Patient called wanting to know if you would be able see her. She tested positive for COVID on Monday and had an infusion for cancer 3 days ago. She states that she is having heart palpitations. She stated that she would really like to speak with you.  Message routed to [facility] [name], NP

## 2022-02-18 ENCOUNTER — Emergency Department (HOSPITAL_COMMUNITY): Payer: Medicare HMO

## 2022-02-18 ENCOUNTER — Other Ambulatory Visit: Payer: Self-pay

## 2022-02-18 ENCOUNTER — Emergency Department (HOSPITAL_COMMUNITY)
Admission: EM | Admit: 2022-02-18 | Discharge: 2022-02-18 | Disposition: A | Discharge status: Home / Self Care | Payer: Medicare HMO | Attending: Emergency Medicine | Admitting: Emergency Medicine

## 2022-02-18 ENCOUNTER — Encounter (HOSPITAL_COMMUNITY): Payer: Self-pay | Admitting: *Deleted

## 2022-02-18 DIAGNOSIS — Z7902 Long term (current) use of antithrombotics/antiplatelets: Secondary | ICD-10-CM | POA: Diagnosis not present

## 2022-02-18 DIAGNOSIS — E860 Dehydration: Secondary | ICD-10-CM | POA: Diagnosis not present

## 2022-02-18 DIAGNOSIS — E039 Hypothyroidism, unspecified: Secondary | ICD-10-CM | POA: Diagnosis not present

## 2022-02-18 DIAGNOSIS — Z1152 Encounter for screening for COVID-19: Secondary | ICD-10-CM | POA: Insufficient documentation

## 2022-02-18 DIAGNOSIS — Z79899 Other long term (current) drug therapy: Secondary | ICD-10-CM | POA: Insufficient documentation

## 2022-02-18 DIAGNOSIS — R531 Weakness: Secondary | ICD-10-CM | POA: Diagnosis present

## 2022-02-18 DIAGNOSIS — I10 Essential (primary) hypertension: Secondary | ICD-10-CM | POA: Diagnosis not present

## 2022-02-18 LAB — URINALYSIS, ROUTINE W REFLEX MICROSCOPIC
Bilirubin Urine: NEGATIVE
Glucose, UA: NEGATIVE mg/dL
Hgb urine dipstick: NEGATIVE
Ketones, ur: NEGATIVE mg/dL
Leukocytes,Ua: NEGATIVE
Nitrite: NEGATIVE
Protein, ur: NEGATIVE mg/dL
Specific Gravity, Urine: 1.013 (ref 1.005–1.030)
pH: 8 (ref 5.0–8.0)

## 2022-02-18 LAB — COMPREHENSIVE METABOLIC PANEL
ALT: 15 U/L (ref 0–44)
AST: 17 U/L (ref 15–41)
Albumin: 4.1 g/dL (ref 3.5–5.0)
Alkaline Phosphatase: 69 U/L (ref 38–126)
Anion gap: 8 (ref 5–15)
BUN: 14 mg/dL (ref 8–23)
CO2: 27 mmol/L (ref 22–32)
Calcium: 9.4 mg/dL (ref 8.9–10.3)
Chloride: 106 mmol/L (ref 98–111)
Creatinine, Ser: 0.74 mg/dL (ref 0.44–1.00)
GFR, Estimated: >60 mL/min (ref >60)
Glucose, Bld: 97 mg/dL (ref 70–99)
Potassium: 3.9 mmol/L (ref 3.5–5.1)
Sodium: 141 mmol/L (ref 135–145)
Total Bilirubin: 0.9 mg/dL (ref 0.3–1.2)
Total Protein: 7.4 g/dL (ref 6.5–8.1)

## 2022-02-18 LAB — CBC WITH DIFFERENTIAL/PLATELET
Abs Immature Granulocytes: 0.01 10*3/uL (ref 0.00–0.07)
Basophils Absolute: 0.1 10*3/uL (ref 0.0–0.1)
Basophils Relative: 1 %
Eosinophils Absolute: 0.2 10*3/uL (ref 0.0–0.5)
Eosinophils Relative: 3 %
HCT: 43.6 % (ref 36.0–46.0)
Hemoglobin: 14.3 g/dL (ref 12.0–15.0)
Immature Granulocytes: 0 %
Lymphocytes Relative: 30 %
Lymphs Abs: 1.9 10*3/uL (ref 0.7–4.0)
MCH: 29.7 pg (ref 26.0–34.0)
MCHC: 32.8 g/dL (ref 30.0–36.0)
MCV: 90.5 fL (ref 80.0–100.0)
Monocytes Absolute: 0.4 10*3/uL (ref 0.1–1.0)
Monocytes Relative: 6 %
Neutro Abs: 3.9 10*3/uL (ref 1.7–7.7)
Neutrophils Relative %: 60 %
Platelets: 263 10*3/uL (ref 150–400)
RBC: 4.82 MIL/uL (ref 3.87–5.11)
RDW: 12.6 % (ref 11.5–15.5)
WBC: 6.5 10*3/uL (ref 4.0–10.5)
nRBC: 0 % (ref 0.0–0.2)

## 2022-02-18 LAB — CBG MONITORING, ED: Glucose-Capillary: 99 mg/dL (ref 70–99)

## 2022-02-18 LAB — TROPONIN I (HIGH SENSITIVITY): Troponin I (High Sensitivity): 3 ng/L (ref <18)

## 2022-02-18 LAB — RESP PANEL BY RT-PCR (RSV, FLU A&B, COVID)  RVPGX2
Influenza A by PCR: NEGATIVE
Influenza B by PCR: NEGATIVE
Resp Syncytial Virus by PCR: NEGATIVE
SARS Coronavirus 2 by RT PCR: NEGATIVE

## 2022-02-18 LAB — TSH: TSH: 0.168 u[IU]/mL — ABNORMAL LOW (ref 0.350–4.500)

## 2022-02-18 LAB — LACTIC ACID, PLASMA: Lactic Acid, Venous: 1.2 mmol/L (ref 0.5–1.9)

## 2022-02-18 MED ORDER — SODIUM CHLORIDE 0.9 % IV BOLUS
1000.0000 mL | Freq: Once | INTRAVENOUS | Status: AC
  Administered 2022-02-18: 1000 mL via INTRAVENOUS

## 2022-02-18 NOTE — ED Triage Notes (Addendum)
Patient is coming from assisted living, [facility].  She is scheduled to have infusion of fluids at [facility] on tomorrow.  She has hx of cancer.  Patient has been more weak and sleepy so family brought her here to receive treatment earlier.  Patient is alert and oriented.  No reported fevers.CBG 134

## 2022-02-18 NOTE — ED Provider Notes (Signed)
[place] EMERGENCY DEPARTMENT AT [facility] Provider Note   CSN: [id number] Arrival date & time: 02/18/22  1130     History  Chief Complaint  Patient presents with   Weakness    Darlene [name] is a 89 y.o. female.  Pt is a 88 yo female with pmhx significant for htn, hld, hypothyroidism, sleep apnea, depression, CVA (no residual deficits), GERD and melanoma.  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  She had infusion #3 on 1/17 (nivolumab).  She has been feeling very tired and keeps falling asleep.  She contracted covid right after her 2nd infusion and was not sure if sx after 2nd infusion were due to covid or the infusion.  She has an appt tomorrow at the cancer center for fluids, but felt so bad today that she called EMS.       Home Medications Prior to Admission medications   Medication Sig Start Date End Date Taking? Authorizing Provider  acetaminophen (TYLENOL) 325 MG tablet Take 650 mg by mouth every 6 (six) hours as needed for headache (pain).    [provider]  Benzocaine-Menthol (CEPACOL) 15-2.3 MG LOZG Use as directed 1 lozenge in the mouth or throat 3 (three) times daily as needed. 06/08/21   Antonieta Pert, MD  clopidogrel (PLAVIX) 75 MG tablet TAKE 1 TABLET BY MOUTH EVERY DAY 07/29/21   Medina-Vargas, Monina C, NP  diazepam (VALIUM) 5 MG tablet Take 0.5 tablets (2.5 mg total) by mouth as needed (anxiety). WILL NEED APPOINTMENT FOR ADDITIONAL REFILLS Patient not taking: Reported on 12/21/2021 10/30/20   [name], [name], NP  isosorbide dinitrate (ISORDIL) 30 MG tablet Take 1 tablet (30 mg total) by mouth 3 (three) times daily. 11/03/21   Ernst Spell, NP  ketoconazole (NIZORAL) 2 % shampoo APPLY 1 APPLICATION TOPICALLY TWICE WEEKLY AS NEEDED FOR SCALP IRRITATION. 03/02/21   Lavonna Monarch, MD  levothyroxine (SYNTHROID) 100 MCG tablet TAKE 1 TABLET BY MOUTH EVERY DAY BEFORE BREAKFAST 11/13/21   [name], [name],  NP  losartan (COZAAR) 50 MG tablet Take 1 tablet (50 mg total) by mouth daily. 11/03/21 10/24/23  Ernst Spell, NP  nitroGLYCERIN (NITROSTAT) 0.4 MG SL tablet Place 1 tablet (0.4 mg total) under the tongue every 5 (five) minutes as needed for up to 25 days for chest pain. 12/21/21 01/15/22  Ernst Spell, NP  omeprazole (PRILOSEC) 40 MG capsule Take 1 capsule (40 mg total) by mouth 2 (two) times daily before lunch and supper. Patient taking differently: Take 40 mg by mouth daily. 09/02/21   Vladimir Crofts, PA-C  Polyethyl Glycol-Propyl Glycol (SYSTANE) 0.4-0.3 % GEL ophthalmic gel Place 1 application. into both eyes at bedtime.    [provider]  pravastatin (PRAVACHOL) 40 MG tablet Take 1 tablet (40 mg total) by mouth daily. 01/13/22   Adrian Prows, MD  Varenicline Tartrate (TYRVAYA) 0.03 MG/ACT SOLN Place 1 spray into the nose 2 (two) times daily.    [provider]  Zolpidem Tartrate 3.5 MG SUBL Take 1 tablet by mouth at bedtime as needed. 11/05/21 12/21/21  [name], [name], NP      Allergies    Lactose intolerance (gi), Norco [hydrocodone-acetaminophen], Dust mite extract, Oxycontin [oxycodone hcl], and Pollen extract    Review of Systems   Review of Systems  Constitutional:  Positive for fatigue.  All other systems reviewed and are negative.   Physical Exam Updated Vital Signs BP (!) 148/127   Pulse  62   Temp 97.7 F (36.5 C) (Oral)   Resp (!) 24   Ht '5\' 3"'$  (1.6 m)   Wt 69.4 kg   SpO2 100%   BMI 27.10 kg/m  Physical Exam Vitals and nursing note reviewed.  Constitutional:      Appearance: Normal appearance.  HENT:     Head: Normocephalic and atraumatic.     Right Ear: External ear normal.     Left Ear: External ear normal.     Nose: Nose normal.     Mouth/Throat:     Mouth: Mucous membranes are dry.  Eyes:     Extraocular Movements: Extraocular movements intact.     Conjunctiva/sclera: Conjunctivae normal.     Pupils: Pupils are equal,  round, and reactive to light.  Cardiovascular:     Rate and Rhythm: Normal rate and regular rhythm.     Pulses: Normal pulses.     Heart sounds: Normal heart sounds.  Pulmonary:     Effort: Pulmonary effort is normal.     Breath sounds: Normal breath sounds.  Abdominal:     General: Abdomen is flat. Bowel sounds are normal.     Palpations: Abdomen is soft.  Musculoskeletal:        General: Normal range of motion.     Cervical back: Normal range of motion and neck supple.  Skin:    General: Skin is warm.     Capillary Refill: Capillary refill takes less than 2 seconds.  Neurological:     General: No focal deficit present.     Mental Status: She is alert and oriented to person, place, and time.  Psychiatric:        Mood and Affect: Mood normal.        Behavior: Behavior normal.     ED Results / Procedures / Treatments   Labs (all labs ordered are listed, but only abnormal results are displayed) Labs Reviewed  TSH - Abnormal; Notable for the following components:      Result Value   TSH 0.168 (*)    All other components within normal limits  RESP PANEL BY RT-PCR (RSV, FLU A&B, COVID)  [id number]  CBC WITH DIFFERENTIAL/PLATELET  COMPREHENSIVE METABOLIC PANEL  URINALYSIS, ROUTINE W REFLEX MICROSCOPIC  LACTIC ACID, PLASMA  CBG MONITORING, ED  TROPONIN I (HIGH SENSITIVITY)    EKG EKG Interpretation  Date/Time:  Thursday February 18 2022 11:54:09 EST Ventricular Rate:  61 PR Interval:  185 QRS Duration: 85 QT Interval:  435 QTC Calculation: 439 R Axis:   25 Text Interpretation: Sinus rhythm Low voltage, precordial leads No significant change since last tracing Confirmed by Isla Pence [phone]) on 02/18/2022 12:12:15 PM  Radiology CT Head Wo Contrast  Result Date: 02/18/2022 CLINICAL DATA:  Lethargy, altered mental status EXAM: CT HEAD WITHOUT CONTRAST TECHNIQUE: Contiguous axial images were obtained from the base of the skull through the vertex without intravenous  contrast. RADIATION DOSE REDUCTION: This exam was performed according to the departmental dose-optimization program which includes automated exposure control, adjustment of the mA and/or kV according to patient size and/or use of iterative reconstruction technique. COMPARISON:  02/08/2020 CTA head neck FINDINGS: Brain: No evidence of acute infarction, hemorrhage, mass, mass effect, or midline shift. No hydrocephalus or extra-axial fluid collection. Remote right occipital infarct. Cerebral volume is within normal limits for age. Vascular: No hyperdense vessel. Skull: Negative for fracture or focal lesion. Sinuses/Orbits: No acute finding. Other: The mastoid air cells are well aerated. IMPRESSION: No acute  intracranial process. Electronically Signed   By: Merilyn Baba M.D.   On: 02/18/2022 14:03   DG Chest Portable 1 View  Result Date: 02/18/2022 CLINICAL DATA:  Provided history: Altered mental status. EXAM: PORTABLE CHEST 1 VIEW COMPARISON:  Prior chest radiographs 06/06/2021 and earlier. FINDINGS: Heart size within normal limits. Aortic atherosclerosis. Calcified granulomas within both lung bases. No appreciable airspace consolidation or pulmonary edema. No evidence of pleural effusion or pneumothorax. No acute bony abnormality identified. IMPRESSION: No evidence of acute cardiopulmonary abnormality. Aortic Atherosclerosis (ICD10-I70.0). Electronically Signed   By: Kellie Simmering D.O.   On: 02/18/2022 12:07    Procedures Procedures    Medications Ordered in ED Medications  sodium chloride 0.9 % bolus 1,000 mL (0 mLs Intravenous Stopped 02/18/22 1554)    ED Course/ Medical Decision Making/ A&P                             Medical Decision Making Amount and/or Complexity of Data Reviewed Labs: ordered. Radiology: ordered.   This patient presents to the ED for concern of fatigue, this involves an extensive number of treatment options, and is a complaint that carries with it a high risk of  complications and morbidity.  The differential diagnosis includes med reaction, electrolyte abn, infection, anemia   Co morbidities that complicate the patient evaluation   htn, hld, hypothyroidism, sleep apnea, depression, CVA (no residual deficits), GERD and melanoma   Additional history obtained:  Additional history obtained from epic chart review External records from outside source obtained and reviewed including EMS report   Lab Tests:  I Ordered, and personally interpreted labs.  The pertinent results include:  ua nl, cmp nl, trop nl, wbc nl, covid/flu neg; tsh sl low at 0.168   Imaging Studies ordered:  I ordered imaging studies including cxr and ct head  I independently visualized and interpreted imaging which showed  CXR: No evidence of acute cardiopulmonary abnormality.    Aortic Atherosclerosis (ICD10-I70.0).  CT head: No acute intracranial process.  I agree with the radiologist interpretation   Cardiac Monitoring:  The patient was maintained on a cardiac monitor.  I personally viewed and interpreted the cardiac monitored which showed an underlying rhythm of: nsr   Medicines ordered and prescription drug management:  I ordered medication including IVFs  for dehydration  Reevaluation of the patient after these medicines showed that the patient improved I have reviewed the patients home medicines and have made adjustments as needed   Test Considered:  ct   Critical Interventions:  ivfs  Problem List / ED Course:  Dehydration:  pt is feeling much better after fluids.  She is able to eat/drink/walk.  She has an appt tomorrow with her cancer center.  She is to keep that appointment.  Pt is stable for d/c.  Return if worse.    Reevaluation:  After the interventions noted above, I reevaluated the patient and found that they have :improved   Social Determinants of Health:  Lives in [facility]   Dispostion:  After consideration of the  diagnostic results and the patients response to treatment, I feel that the patent would benefit from discharge with outpatient f/u.          Final Clinical Impression(s) / ED Diagnoses Final diagnoses:  Weakness  Dehydration    Rx / DC Orders ED Discharge Orders     None         Isla Pence,  MD 02/18/22 1554

## 2022-03-10 ENCOUNTER — Other Ambulatory Visit: Payer: Self-pay | Admitting: Adult Health

## 2022-03-10 NOTE — Telephone Encounter (Signed)
High risk or very high risk warning populated when attempting to refill medication. RX request sent to PCP for review and approval if warranted.   

## 2022-03-19 ENCOUNTER — Ambulatory Visit: Payer: Medicare HMO | Admitting: Pulmonary Disease

## 2022-03-22 ENCOUNTER — Ambulatory Visit: Payer: Medicare HMO | Admitting: Cardiology

## 2022-03-24 NOTE — Progress Notes (Unsigned)
03/25/2022 Darlene [name] [id number] 1933-02-12  Referring provider: NP. Man [name] Primary GI doctor: Dr. Rush Landmark  ASSESSMENT AND PLAN:  Gastroesophageal reflux disease without esophagitis, dysphagia, sore throat EGD 2020 showed grade C and had to have repeat EGD 2021 that showed improvement.  04/2020 barium swallow marked dysmotility Patient with improvement of GERD/dysphagia with omeprazole Bid for 1 month, now daily Can add on pepcid if needed Uncertain if part of the dysphagia could be related to her invasive melanoma or not but with improved of dysphagia with GERD treatment, doubtful.  Consider barium swallow if symptoms return or worsen Getting CT chest, AB and pelvis Friday, sent reminder to myself to review Monday   invasive mucosal melanoma nasal vault area. Getting CT chest, AB and pelvis Friday, sent reminder to myself to review Monday  Not candidate for rhinectomy/surgical therapy  10/2021 started on immunotherapy for mucosal melanoma   Patient is 41, on plavix and higher risk for endoscopic procedures, has 3-4 month follow up  Will call sooner if any worsening or concerning symptoms  History of Present Illness:  89 y.o. female  with a past medical history of CVA (on Plavix), atrial fibrillation, anxiety/depression, sleep apnea, colon polyps, status post appendectomy, chronic sore throat, esophagitis, hiatal hernia, esophageal dysmotility, diverticulosis, hemorrhoids, colonic AVM, adenomatous colon polyps 10/2018 and others listed below, returns to clinic today for evaluation of chronic sinusitis/GERD.  05/08/2021 patient seen in the office for abdominal bloating and diarrhea thought to be due to lactose has improved since stopping. Given FODMAP diet. Hospital admission 05/20-05/22 for chest discomfort and non productive cough, found to have acute hypoxic respiratory failure positive for rhinovirus and sepsis. Had chronic sinus disease, chronic sore throat/cough.  Discharged on augmentin and prednisone.  06/06/2021 Repeat CXR unremarkable.  09/02/2021 patient office visit with myself for GERD/chronic sinusitis.  Was leaving for [place] and opted not to have endoscopic evaluation, patient also high risk with age and Plavix.  Given omeprazole 40 mg twice daily and Carafate added.  Patient also had follow-up with [facility] at that time. 11/04/2021 surgical pathology from biopsies along right inferior turbinate and lateral nasal wall showed mucosal melanoma.  PET/CT negative for distance disease.  Face/sinus MRI with and without showed enhancement in right nasal cavity without spread elsewhere. 11/17/2021 operation for right nasal cavity and 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  Defers rhinectomy/surgical therapy and starts immunotherapy for mucosal melanoma. 11/22/ 2023 Infusion 1 Nivolumab, '400mg'$  IV every 4 weeks.  03/08/2022 last office visit with [facility] patient on Nivolumab infusions for mucosal melanoma under direction of Heme Onc.  Had some new pigment areas along right inferior alveolar ridge mucosa and left hard palate concerning for melanoma.  Pending chest, AB, pelvis on Friday with [facility]  Getting another infusion this Monday. She states she mainly has fatigue with her infusions.   She is on the omeprazole 40 mg twice a day for a month and then went back down to once a day due to being taken so close to her synthroid, having less dysphagia and less GERD.  No night symptoms. No odynophagia.  Sore throat comes and goes, not worse after food/in the AM. She does have sinus pressure, has fullness in her ears.  No history of smoking.  Denies NSAIDS, ETOH ( will hurt her throat).  04/2020 barium swallow marked dysmotility 2021 EGD relook after treatment with empiric dilatation due to dysphagia with mild improvement. 10/26/2018 endoscopy for reflux showed LA grade  C esophagitis, 4 cm hiatal hernia multiple gastric  polyps 10/26/2018 colonoscopy nonbleeding nonthrombosed external and internal hemorrhoids, diverticulosis diffuse, 5 (2 adenomatous and 3 sessile serrated) polyps 2-13 millimeters in size, single colonic angiectasia, normally would recommend colonoscopy 3 years  Current Medications:   Current Outpatient Medications (Endocrine & Metabolic):    levothyroxine (SYNTHROID) 100 MCG tablet, TAKE 1 TABLET BY MOUTH EVERY DAY BEFORE BREAKFAST  Current Outpatient Medications (Cardiovascular):    losartan (COZAAR) 50 MG tablet, Take 1 tablet (50 mg total) by mouth daily.   nitroGLYCERIN (NITROSTAT) 0.4 MG SL tablet, Place 1 tablet (0.4 mg total) under the tongue every 5 (five) minutes as needed for up to 25 days for chest pain.   pravastatin (PRAVACHOL) 40 MG tablet, Take 1 tablet (40 mg total) by mouth daily.   Current Outpatient Medications (Analgesics):    acetaminophen (TYLENOL) 325 MG tablet, Take 650 mg by mouth every 6 (six) hours as needed for headache (pain).  Current Outpatient Medications (Hematological):    clopidogrel (PLAVIX) 75 MG tablet, TAKE 1 TABLET BY MOUTH EVERY DAY  Current Outpatient Medications (Other):    Benzocaine-Menthol (CEPACOL) 15-2.3 MG LOZG, Use as directed 1 lozenge in the mouth or throat 3 (three) times daily as needed.   diazepam (VALIUM) 5 MG tablet, Take 0.5 tablets (2.5 mg total) by mouth as needed (anxiety). WILL NEED APPOINTMENT FOR ADDITIONAL REFILLS   ketoconazole (NIZORAL) 2 % shampoo, APPLY 1 APPLICATION TOPICALLY TWICE WEEKLY AS NEEDED FOR SCALP IRRITATION.   omeprazole (PRILOSEC) 40 MG capsule, Take 40 mg by mouth daily.   Polyethyl Glycol-Propyl Glycol (SYSTANE) 0.4-0.3 % GEL ophthalmic gel, Place 1 application. into both eyes at bedtime.   Zolpidem Tartrate 3.5 MG SUBL, Take 1 tablet by mouth at bedtime as needed.  Surgical History:  She  has a past surgical history that includes Lumbar disc surgery (2014); Appendectomy (1960); Tonsillectomy (1940);  Adenoidectomy; Mesh applied to lap port; Colonoscopy; Gum surgery (03/2019); Melanoma excision (2020); Cataract extraction (Left); and Sinus exploration. Family History:  Her family history includes Allergic rhinitis in her sister; Bipolar disorder in her daughter; CVA in her brother; Heart attack in her father and paternal grandfather; Heart disease in her mother; Hypertension in her sister; Lung cancer in her maternal grandfather; Lung cancer (age of onset: 81) in her mother; Melanoma in her daughter; Psoriasis in her daughter; Rheum arthritis in her daughter; Stroke (age of onset: 42) in her father. Social History:   reports that she has never smoked. She has never used smokeless tobacco. She reports that she does not currently use alcohol. She reports that she does not use drugs.  Current Medications, Allergies, Past Medical History, Past Surgical History, Family History and Social History were reviewed in [employer] record.  Physical Exam: BP 122/72   Pulse 86   Wt 152 lb 6 oz (69.1 kg)   SpO2 98%   BMI 26.99 kg/m  General:   Pleasant, well developed female in no acute distress, HOH Heart:  Regular rate and rhythm; no murmurs Pulm: Clear anteriorly; no wheezing Sinus: has some tenderness to palpation bilateral maxillary sinuses, right greater than left, some erythema/discoloring of lower inner lip, no neck lymphadenopathy Abdomen:  Soft, Obese AB, Active bowel sounds. No tenderness . , No organomegaly appreciated. Extremities:  without  edema. Neurologic:  Alert and  oriented x4;  No focal deficits.  Psych:  Cooperative. Normal mood and affect.   Vladimir Crofts, PA-C 03/25/22

## 2022-03-25 ENCOUNTER — Ambulatory Visit: Payer: Medicare HMO | Admitting: Physician Assistant

## 2022-03-25 ENCOUNTER — Encounter: Payer: Self-pay | Admitting: Physician Assistant

## 2022-03-25 VITALS — BP 122/72 | HR 86 | Wt 152.4 lb

## 2022-03-25 DIAGNOSIS — J312 Chronic pharyngitis: Secondary | ICD-10-CM | POA: Diagnosis not present

## 2022-03-25 DIAGNOSIS — R1319 Other dysphagia: Secondary | ICD-10-CM

## 2022-03-25 DIAGNOSIS — K219 Gastro-esophageal reflux disease without esophagitis: Secondary | ICD-10-CM | POA: Diagnosis not present

## 2022-03-25 DIAGNOSIS — C312 Malignant neoplasm of frontal sinus: Secondary | ICD-10-CM | POA: Diagnosis not present

## 2022-03-25 NOTE — Progress Notes (Signed)
Attending Physician's Attestation   I have reviewed the chart.   I agree with the Advanced Practitioner's note, impression, and recommendations with any updates as below.    Malaysia Crance Mansouraty, MD [facility]oscopy Office # [phone]  

## 2022-03-25 NOTE — Patient Instructions (Signed)
Continue the omeprazole once daily- Please take this medication 30 minutes to 1 hour before meals- this makes it more effective.  Can add on pepcid or famotidine at night if worsening symptoms Avoid spicy and acidic foods Avoid fatty foods Limit your intake of coffee, tea, alcohol, and carbonated drinks Work to maintain a healthy weight Keep the head of the bed elevated at least 3 inches with blocks or a wedge pillow if you are having any nighttime symptoms Stay upright for 2 hours after eating Avoid meals and snacks three to four hours before bedtime  Can get barium swallow if symptoms return.   We will check out the CT AB and pelvis

## 2022-03-29 ENCOUNTER — Ambulatory Visit: Payer: Medicare HMO | Admitting: Cardiology

## 2022-03-29 ENCOUNTER — Encounter: Payer: Self-pay | Admitting: Cardiology

## 2022-03-29 VITALS — BP 143/72 | HR 67 | Resp 16 | Ht 63.0 in | Wt 152.0 lb

## 2022-03-29 DIAGNOSIS — I10 Essential (primary) hypertension: Secondary | ICD-10-CM

## 2022-03-29 DIAGNOSIS — E7800 Pure hypercholesterolemia, unspecified: Secondary | ICD-10-CM

## 2022-03-29 DIAGNOSIS — I201 Angina pectoris with documented spasm: Secondary | ICD-10-CM

## 2022-03-29 MED ORDER — EZETIMIBE 10 MG PO TABS: 10.0000 mg | ORAL_TABLET | Freq: Every evening | ORAL | 3 refills | Status: DC

## 2022-03-29 NOTE — Progress Notes (Signed)
Primary Physician/Referring:  Barron Schmid, MD  Patient ID: Darlene [name], female    DOB: [birth year], 89 y.o.   MRN: [id number]  Chief Complaint  Patient presents with   Chest Pain   Hyperlipidemia   Follow-up    3 month   Dizziness    HPI:    Darlene [name]  is a 89 y.o. Caucasian female with hypertension, hyperlipidemia, prediabetes, h/o sleep apnea not on CPAP, prior history of stroke in 2006 without residual defect, on Plavix chronically. She has had frequent palpitations, difficulty in controlling hypertension, extreme anxiety, multiple medication intolerances.  However with the present combination of medications, blood pressure has been well controlled. 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   Presently having side effects from immunotherapy and also had Covid in the beginning of Feb 2024. This is a 3 month follow up of hypertension and angina pectoris. She has normal coronary arteries and coronary calcium score of "0" by coronary CTA 09/03/2020.  She is presently doing well from cardiac standpoint and has not had any recent anginal symptoms.   Past Medical History:  Diagnosis Date   Anxiety    Atypical mole 11/11/2020   Left Inner Knee (moderate-free)   Colon polyps    Depression    Hyperlipidemia    Hypertension    Hypothyroidism    Melanoma in situ ([place]) 09/19/2018   Right Arm (excision)   Skin cancer    Sleep apnea    Stroke [facility]) 2008   Past Surgical History:  Procedure Laterality Date   ADENOIDECTOMY     APPENDECTOMY  1960   CATARACT EXTRACTION Left    COLONOSCOPY     GUM SURGERY  03/2019   LUMBAR [place] SURGERY  2014   MELANOMA EXCISION  2020   MESH APPLIED TO LAP PORT     SINUS EXPLORATION     11/17/2021 operation for right nasal cavity and 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   TONSILLECTOMY  1940   Family History  Problem Relation Age of Onset   Lung cancer  Mother 39       smoker   Heart disease Mother    Stroke Father 22   Heart attack Father    Allergic rhinitis Sister    Hypertension Sister    CVA Brother    Lung cancer Maternal Grandfather    Heart attack Paternal Grandfather    Melanoma Daughter    Psoriasis Daughter    Rheum arthritis Daughter    Bipolar disorder Daughter    Colon cancer Neg Hx    Esophageal cancer Neg Hx    Inflammatory bowel disease Neg Hx    Liver disease Neg Hx    Pancreatic cancer Neg Hx    Stomach cancer Neg Hx    Rectal cancer Neg Hx     Social History   Tobacco Use   Smoking status: Never   Smokeless tobacco: Never  Substance Use Topics   Alcohol use: Not Currently    Comment: hardly ever   ROS   Review of Systems  Constitutional: Positive for malaise/fatigue.  Cardiovascular:  Negative for chest pain, dyspnea on exertion and leg swelling.   Objective  Blood pressure (!) 143/72, pulse 67, resp. rate 16, height '5\' 3"'$  (1.6 m), weight 152 lb (68.9 kg), SpO2 98 %.     03/29/2022    9:19 AM 03/25/2022    9:16 AM 02/18/2022  2:30 PM  Vitals with BMI  Height '5\' 3"'$     Weight 152 lbs 152 lbs 6 oz   BMI 0000000    Systolic [id number]  Diastolic 72 72 [id number]  Pulse 67 86 62  Orthostatic VS for the past 72 hrs (Last 3 readings):  Orthostatic BP Patient Position BP Location Cuff Size Orthostatic Pulse  03/29/22 0928 145/62 Standing Left Arm Normal 80  03/29/22 0927 142/71 Sitting Left Arm Normal 72  03/29/22 0926 147/77 Supine Left Arm Normal 71    Physical Exam Neck:     Vascular: No carotid bruit or JVD.  Cardiovascular:     Rate and Rhythm: Normal rate and regular rhythm.     Pulses: Intact distal pulses.     Heart sounds: Normal heart sounds. No murmur heard.    No gallop.  Pulmonary:     Effort: Pulmonary effort is normal.     Breath sounds: Normal breath sounds.  Abdominal:     General: Bowel sounds are normal.     Palpations: Abdomen is soft.  Musculoskeletal:     Right lower  leg: No edema.     Left lower leg: No edema.    Laboratory examination:   Lab Results  Component Value Date   NA 141 02/18/2022   K 3.9 02/18/2022   CO2 27 02/18/2022   GLUCOSE 97 02/18/2022   BUN 14 02/18/2022   CREATININE 0.74 02/18/2022   CALCIUM 9.4 02/18/2022   EGFR 73 05/12/2021   GFRNONAA >60 02/18/2022       Latest Ref Rng & Units 02/18/2022   11:48 AM 06/07/2021    4:08 AM 06/06/2021    8:00 AM  CMP  Glucose 70 - 99 mg/dL 97  110  161   BUN 8 - 23 mg/dL '14  9  9   '$ Creatinine 0.44 - 1.00 mg/dL 0.74  0.85  0.87   Sodium 135 - 145 mmol/L 141  138  142   Potassium 3.5 - 5.1 mmol/L 3.9  3.5  4.0   Chloride 98 - 111 mmol/L 106  107  109   CO2 22 - 32 mmol/L '27  26  24   '$ Calcium 8.9 - 10.3 mg/dL 9.4  8.3  9.3   Total Protein 6.5 - 8.1 g/dL 7.4  5.3  6.5   Total Bilirubin 0.3 - 1.2 mg/dL 0.9  0.7  0.8   Alkaline Phos 38 - 126 U/L 69  50  64   AST 15 - 41 U/L '17  18  20   '$ ALT 0 - 44 U/L '15  14  15       '$ Latest Ref Rng & Units 02/18/2022   11:48 AM 06/07/2021    4:08 AM 06/06/2021    8:00 AM  CBC  WBC 4.0 - 10.5 K/uL 6.5  6.5  11.1   Hemoglobin 12.0 - 15.0 g/dL 14.3  11.8  13.9   Hematocrit 36.0 - 46.0 % 43.6  35.5  42.7   Platelets 150 - 400 K/uL 263  185  229    Lipid Panel     Component Value Date/Time   CHOL 163 05/12/2021 1151   TRIG 144 05/12/2021 1151   HDL 44 05/12/2021 1151   CHOLHDL 3.5 12/26/2019 1053   LDLCALC 94 05/12/2021 1151   LDLCALC 107 (H) 12/26/2019 1053   HEMOGLOBIN A1C Lab Results  Component Value Date   HGBA1C 6.1 (H) 06/07/2021   MPG 128.37 06/07/2021  TSH Recent Labs    02/18/22 1235  TSH 0.168*   Allergies   Allergies  Allergen Reactions   Lactose Intolerance (Gi) Other (See Comments)    indigestion   Norco [Hydrocodone-Acetaminophen] Nausea And Vomiting   Dust Mite Extract Other (See Comments)    Sinus drainage   Oxycontin [Oxycodone Hcl] Nausea And Vomiting   Pollen Extract Other (See Comments)    Sinus drainage      Final Medications at End of Visit    Current Outpatient Medications:    acetaminophen (TYLENOL) 325 MG tablet, Take 650 mg by mouth every 6 (six) hours as needed for headache (pain)., Disp: , Rfl:    cephALEXin (KEFLEX) 500 MG capsule, Take 500 mg by mouth 2 (two) times daily., Disp: , Rfl:    clopidogrel (PLAVIX) 75 MG tablet, TAKE 1 TABLET BY MOUTH EVERY DAY, Disp: 90 tablet, Rfl: 1   ezetimibe (ZETIA) 10 MG tablet, Take 1 tablet (10 mg total) by mouth every evening., Disp: 90 tablet, Rfl: 3   ketoconazole (NIZORAL) 2 % shampoo, APPLY 1 APPLICATION TOPICALLY TWICE WEEKLY AS NEEDED FOR SCALP IRRITATION., Disp: 120 mL, Rfl: 1   levothyroxine (SYNTHROID) 100 MCG tablet, TAKE 1 TABLET BY MOUTH EVERY DAY BEFORE BREAKFAST, Disp: 90 tablet, Rfl: 3   losartan (COZAAR) 50 MG tablet, Take 1 tablet (50 mg total) by mouth daily., Disp: 90 tablet, Rfl: 3   nitroGLYCERIN (NITROSTAT) 0.4 MG SL tablet, Place 1 tablet (0.4 mg total) under the tongue every 5 (five) minutes as needed for up to 25 days for chest pain., Disp: 25 tablet, Rfl: 3   omeprazole (PRILOSEC) 40 MG capsule, Take 40 mg by mouth daily., Disp: , Rfl:    Polyethyl Glycol-Propyl Glycol (SYSTANE) 0.4-0.3 % GEL ophthalmic gel, Place 1 application. into both eyes at bedtime., Disp: , Rfl:    pravastatin (PRAVACHOL) 40 MG tablet, Take 1 tablet (40 mg total) by mouth daily., Disp: 90 tablet, Rfl: 2   Zolpidem Tartrate 3.5 MG SUBL, Take 1 tablet by mouth at bedtime as needed., Disp: 30 tablet, Rfl: 5   diazepam (VALIUM) 5 MG tablet, Take 0.5 tablets (2.5 mg total) by mouth as needed (anxiety). WILL NEED APPOINTMENT FOR ADDITIONAL REFILLS (Patient not taking: Reported on 03/29/2022), Disp: 15 tablet, Rfl: 0   Radiology:   CXR 03/11/2019: Cardiac shadow is within normal limits. Aortic calcifications are seen. The lungs are clear. No bony abnormality is noted.  MRI brain 09/20/2019: Normal.  CT angiogram head 02/08/2020: 1. No emergent finding or  specific explanation for headache. 2. Remote right occipital infarction.  MR cervical spine 02/26/2020: 1. No acute osseous abnormality in the cervical spine. 2. Mild multifactorial degenerative spinal stenosis C4-C5 through C6-C7. Very mild cord mass effect but no cord signal abnormality. 3. Associated severe degenerative foraminal stenosis at the bilateral C5 nerve levels.  Up to moderate left C6 and left C7 nerve level foraminal stenosis  CT Chest and Abdomen 03/26/2022: 1.  Punctate nodule in the right upper lobe. Attention on a follow-up exam. Otherwise no acute findings or metastatic disease in the chest/abdomen/pelvis.   Cardiac Studies:   Stress Echocardiogram 17-Jun-2017:  Resting EKG demonstrated normal sinus rhythm. Stress EKG is negative for myocardial ischemia, patient exercised for 4:29 minutes, achieved 6.2 Mets. Normal blood pressure response. No evidence of ischemia. Stress echocardiogram: Normal LV systolic function, no stress induced wall motion abnormality. EF 75-80%.  Nocturnal oximetry 04/18/2019: SPO2 88% for 6 minutes SPO2 less than 89% 11  minutes. Oxygen desaturation events 03/26/1940.  Desaturation index 42.  Minimum heart rate 54, maximum heart rate 89 bpm, average heart rate 60 bpm.  Percentage in bradycardia 66%.  Highest SPO2 99%, lowest SPO2 84%. Impression: Patient qualifies for nocturnal oxygen supplementation per Medicare guidelines Group 1.  Remote outpatient cardiac telemetry 04/24/2019 through 05/08/2019: Predominant rhythm is normal sinus rhythm.  Minimum heart rate 49, average heart rate 72 and maximum heart rate 195 bpm. PVC burden <1%.  There were 0 ventricular couplets and 0 ventricular runs. PAC burden <1%.  There was 1 supraventricular run of 5 beats.  No atrial fibrillation.  No heart block. 12 patient activated events correlated with sinus rhythm.  PCV MYOCARDIAL PERFUSION WO LEXISCAN 04/14/2020 Normal ECG stress. The patient exercised for  5 minutes and 0 seconds of a Bruce protocol, achieving approximately 7.03 METs.  Normal BP response. Reduced exercise tolerance. Non-limiting chest pain with exercise. Myocardial perfusion is normal. Overall LV systolic function is normal without regional wall motion abnormalities. Stress LV EF: 63%. No previous exam available for comparison. Low risk.  PCV ECHOCARDIOGRAM COMPLETE [id number] Normal LV systolic function with visual EF 60-65%. Left ventricle cavity is normal in size. Normal global wall motion. Normal diastolic filling pattern, normal LAP. Mild tricuspid regurgitation. Compared to prior study dated 04/24/2019: no significant change.  Coronary CTA 09/01/2020: 1. No acute findings in the imaged extracardiac chest. 2. Small to moderate hiatal hernia. 3. Aortic Atherosclerosis (ICD10-I70.0).   1. Total coronary calcium score of 0 AU.  2. Normal coronary origin with left dominance. Normal coronary arteries.   3. Aortic atherosclerosis.  4. CAD-RADS 0: No evidence of epicardial coronary artery disease (0%). RECOMMENDATIONS: Consider non-atherosclerotic causes of chest pain.  EKG   EKG 03/29/2022: Normal sinus rhythm with rate of 71 bpm, normal axis, IRBBB, nonspecific T wave flattening.  No significant change from 05/08/2021.  Assessment     ICD-10-CM   1. Prinzmetal's angina (HCC)  I20.1 EKG 12-Lead    2. Primary hypertension  I10     3. Pure hypercholesterolemia  E78.00 ezetimibe (ZETIA) 10 MG tablet      Meds ordered this encounter  Medications   ezetimibe (ZETIA) 10 MG tablet    Sig: Take 1 tablet (10 mg total) by mouth every evening.    Dispense:  90 tablet    Refill:  3    Medications Discontinued During This Encounter  Medication Reason   Benzocaine-Menthol (CEPACOL) 15-2.3 MG LOZG Patient Preference    Orders Placed This Encounter  Procedures   EKG 12-Lead    Recommendations:   Darlene [name]  is a  89 y.o. Caucasian female with hypertension,  hyperlipidemia, prediabetes, hypothyroidism, h/o sleep apnea not on CPAP, prior history of stroke in 2006 without residual defect, on Plavix chronically. She has had frequent palpitations, difficulty in controlling hypertension, extreme anxiety, multiple medication intolerances.  However with the present combination of medications, blood pressure has been well controlled. 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   Presently having side effects from immunotherapy and also had Covid in the beginning of Feb 2024. This is a 3 month follow up of hypertension and angina pectoris. She has normal coronary arteries and coronary calcium score of "0" by coronary CTA 09/03/2020.  1. Prinzmetal's angina [facility]) Patient is presently doing well and has not had any recurrence of angina pectoris for the past 6 months.  She has nitroglycerin to be taken on a as  needed basis.  2. Primary hypertension Blood pressure Controlled, reviewed her external labs, renal function is normal.  3. Pure hypercholesterolemia LDL is >100, would like to get her down to <100 although 89 years of age in view of prior history of stroke.  She is willing to try ezetimibe 10 mg daily along with pravastatin, but she would like to think about this first and I given her information about the medication.  Otherwise she remains stable from cardiac standpoint, no change in her physical exam, no significant change in her EKG, I will see her back in a year or sooner if problems.  Fortunately I looked at her recent CT scan of the chest and abdomen revealing no evidence of any metastatic disease.   Adrian Prows, MD, [facility] 03/29/2022, 10:03 AM Office: [phone] Fax: [phone] Pager: [phone]

## 2022-04-14 ENCOUNTER — Other Ambulatory Visit: Payer: Self-pay | Admitting: Gastroenterology

## 2022-05-22 ENCOUNTER — Emergency Department (HOSPITAL_COMMUNITY)
Admission: EM | Admit: 2022-05-22 | Discharge: 2022-05-22 | Disposition: A | Discharge status: Home / Self Care | Payer: Medicare HMO | Attending: Emergency Medicine | Admitting: Emergency Medicine

## 2022-05-22 ENCOUNTER — Other Ambulatory Visit: Payer: Self-pay

## 2022-05-22 DIAGNOSIS — C439 Malignant melanoma of skin, unspecified: Secondary | ICD-10-CM | POA: Insufficient documentation

## 2022-05-22 DIAGNOSIS — R220 Localized swelling, mass and lump, head: Secondary | ICD-10-CM | POA: Diagnosis present

## 2022-05-22 MED ORDER — KETOROLAC TROMETHAMINE 15 MG/ML IJ SOLN
15.0000 mg | Freq: Once | INTRAMUSCULAR | Status: AC
  Administered 2022-05-22: 15 mg via INTRAMUSCULAR
  Filled 2022-05-22: qty 1

## 2022-05-22 MED ORDER — DEXAMETHASONE SODIUM PHOSPHATE 10 MG/ML IJ SOLN
8.0000 mg | Freq: Once | INTRAMUSCULAR | Status: AC
  Administered 2022-05-22: 8 mg via INTRAMUSCULAR
  Filled 2022-05-22: qty 1

## 2022-05-22 NOTE — ED Provider Notes (Signed)
[place] EMERGENCY DEPARTMENT AT [facility] Provider Note   CSN: [id number] Arrival date & time: 05/22/22  4098     History Malignant Melanoma Chief Complaint  Patient presents with   Allergic Reaction    Zuley Karel is a 89 y.o. female.  89 y.o female with a PMH of Malignant Melanoma presents to the ED via EMS with a chief complaint of facial swelling x months. Patient has been receiving infusion for malignant melanoma, states after her last infusion on Wednesday swelling has gotten worse. She has been using a special mouth rinse for her mouth but the swelling in her mouth is not improving. Reports getting up around 4am this morning, trying to go back to bed but had a harder time opening the right eye as this was more swollen. She has no difficulty swallowing. There is no shortness of breath, no trouble tolerating her secretions, no signs of respiratory distress.   The history is provided by the patient and medical records.  Allergic Reaction Presenting symptoms: no difficulty breathing, no difficulty swallowing, no rash and no swelling        Home Medications Prior to Admission medications   Medication Sig Start Date End Date Taking? Authorizing Provider  acetaminophen (TYLENOL) 325 MG tablet Take 650 mg by mouth every 6 (six) hours as needed for headache (pain).    [provider]  cephALEXin (KEFLEX) 500 MG capsule Take 500 mg by mouth 2 (two) times daily. 03/25/22   [provider]  clopidogrel (PLAVIX) 75 MG tablet TAKE 1 TABLET BY MOUTH EVERY DAY 03/11/22   [name], [name], NP  diazepam (VALIUM) 5 MG tablet Take 0.5 tablets (2.5 mg total) by mouth as needed (anxiety). WILL NEED APPOINTMENT FOR ADDITIONAL REFILLS Patient not taking: Reported on 03/29/2022 10/30/20   [name], [name], NP  ezetimibe (ZETIA) 10 MG tablet Take 1 tablet (10 mg total) by mouth every evening. 03/29/22 03/24/23  Yates Decamp, MD  ketoconazole (NIZORAL) 2 % shampoo APPLY 1 APPLICATION  TOPICALLY TWICE WEEKLY AS NEEDED FOR SCALP IRRITATION. 03/02/21   Janalyn Harder, MD  levothyroxine (SYNTHROID) 100 MCG tablet TAKE 1 TABLET BY MOUTH EVERY DAY BEFORE BREAKFAST 11/13/21   [name], [name], NP  losartan (COZAAR) 50 MG tablet Take 1 tablet (50 mg total) by mouth daily. 11/03/21 10/24/23  Nori Riis, NP  nitroGLYCERIN (NITROSTAT) 0.4 MG SL tablet Place 1 tablet (0.4 mg total) under the tongue every 5 (five) minutes as needed for up to 25 days for chest pain. 12/21/21 02/19/23  Nori Riis, NP  omeprazole (PRILOSEC) 40 MG capsule TAKE 1 CAPSULE TWICE A DAY BEFORE A MEAL 04/14/22   Mansouraty, Netty Starring., MD  Polyethyl Glycol-Propyl Glycol (SYSTANE) 0.4-0.3 % GEL ophthalmic gel Place 1 application. into both eyes at bedtime.    [provider]  pravastatin (PRAVACHOL) 40 MG tablet Take 1 tablet (40 mg total) by mouth daily. 01/13/22   Yates Decamp, MD  Zolpidem Tartrate 3.5 MG SUBL Take 1 tablet by mouth at bedtime as needed. 11/05/21 02/19/23  [name], [name], NP      Allergies    Lactose intolerance (gi), Norco [hydrocodone-acetaminophen], Dust mite extract, Oxycontin [oxycodone hcl], and Pollen extract    Review of Systems   Review of Systems  Constitutional:  Negative for chills and fever.  HENT:  Positive for facial swelling. Negative for trouble swallowing.   Respiratory:  Negative for shortness of breath.   Cardiovascular:  Negative for chest pain.  Gastrointestinal:  Negative for abdominal pain.  Skin:  Negative for rash.  All other systems reviewed and are negative.   Physical Exam Updated Vital Signs BP (!) 155/95 (BP Location: Left Arm)   Pulse 68   Temp (!) 97.3 F (36.3 C) (Oral)   Resp 12   SpO2 99%  Physical Exam Vitals and nursing note reviewed.  Constitutional:      Appearance: Normal appearance.  HENT:     Head: Normocephalic and atraumatic.     Comments: Face appears symmetric without any swelling.     Right Ear: Tympanic membrane  normal.     Left Ear: Tympanic membrane normal.     Ears:     Comments: BL TM's without any bulging or injection.     Nose: Nose normal.     Mouth/Throat:     Mouth: Mucous membranes are moist.     Comments: Oropharynx is clear without any swelling.  Eyes:     Pupils: Pupils are equal, round, and reactive to light.  Cardiovascular:     Rate and Rhythm: Normal rate.  Pulmonary:     Effort: Pulmonary effort is normal.     Breath sounds: No wheezing.     Comments: No wheezing, no stridor.  Abdominal:     General: Abdomen is flat.  Musculoskeletal:     Cervical back: Normal range of motion and neck supple.  Skin:    General: Skin is warm and dry.  Neurological:     Mental Status: She is alert and oriented to person, place, and time.     ED Results / Procedures / Treatments   Labs (all labs ordered are listed, but only abnormal results are displayed) Labs Reviewed - No data to display  EKG None  Radiology No results found.  Procedures Procedures    Medications Ordered in ED Medications - No data to display  ED Course/ Medical Decision Making/ A&P                             Medical Decision Making Risk Prescription drug management.     Patient presents to the ED via EMS with a chief complaint of facial swelling this been ongoing for months, however worsened over the past 24 hours.  Did call her hematologist oncologist over at [facility] and reports they told her to be evaluated in the emergency department.  On arrival, patient is overall well-appearing.  Vital signs are reassuring, no signs of respiratory distress satting at 99% on room air, no tachypnea noted.  No tachycardia.  Slight elevation of her blood pressure. Reports she woke up this morning and felt that the swelling was worse especially on the right side to her right eye, along with her right ear.  On exam she is speaking in full sentences, oropharynx is clear without any signs of swelling.  No swelling  noted over to the right over the left thigh.  She does have some swelling to bilateral under eyes but this seems to be symmetric.  Visualization of bilateral TMs without any injection or bulging noted.  Patient is undergoing infusion reports her last infusion was Wednesday.  She is receiving care over at [facility] for malignant melanoma.  I spoke to Dr. Sandie Ano of hematology oncology who reports likely not related to the infusions as she has been having multiple infusions and her last one was Wednesday.  He will send a message to her  primarily oncologist and patient could call the office to further follow-up.    Case discussed with my attending Dr. Darleene Cleaver, will provide patient with decadron along with toradol to help with symptoms. Patient hemodynamically stable for discharge.    Portions of this note were generated with Scientist, clinical (histocompatibility and immunogenetics). Dictation errors may occur despite best attempts at proofreading.   Final Clinical Impression(s) / ED Diagnoses Final diagnoses:  Facial swelling    Rx / DC Orders ED Discharge Orders     None         Claude Manges, PA-C 05/22/22 0804    Sabas Sous, MD 06/02/22 [phone]

## 2022-05-22 NOTE — ED Notes (Signed)
Pt is waiting for her cab.

## 2022-05-22 NOTE — Discharge Instructions (Addendum)
Please follow up with your oncologist for further management of your ongoing treatment.

## 2022-05-22 NOTE — ED Triage Notes (Signed)
[facility]. Pt c/o of allergic reaction. Pt eyes swollen. Pt c/o of ear pressure that is worse on right side. Pt hx mucosal melanoma. Pt denies pain Pt aox4 148/82 64hr 97% RA 128cbg

## 2022-05-22 NOTE — ED Triage Notes (Signed)
[facility]. Pt c/o of allergic reaction. Pt eyes swollen. Pt c/o of ear pressure that is worse on right side. Pt hx mucosal melanoma. Pt denies pain Pt aox4 148/82 64hr 97% RA 128cbg 

## 2022-06-01 ENCOUNTER — Other Ambulatory Visit: Payer: Self-pay | Admitting: Nurse Practitioner

## 2022-06-01 DIAGNOSIS — K219 Gastro-esophageal reflux disease without esophagitis: Secondary | ICD-10-CM

## 2022-06-01 DIAGNOSIS — F411 Generalized anxiety disorder: Secondary | ICD-10-CM

## 2022-06-01 NOTE — Telephone Encounter (Signed)
Patient is requesting a refill of the following medications: Requested Prescriptions   Pending Prescriptions Disp Refills   Zolpidem Tartrate 3.5 MG SUBL [Pharmacy Med Name: ZOLPIDEM TART 3.5 MG TABLET SL] 30 tablet     Sig: TAKE 1 TABLET BY MOUTH EVERY DAY AT BEDTIME AS NEEDED    Date of last refill: 11/05/2021  Refill amount: Unknown  Treatment agreement date: Unknown

## 2022-06-03 ENCOUNTER — Telehealth: Payer: Self-pay | Admitting: *Deleted

## 2022-06-03 NOTE — Telephone Encounter (Signed)
Patient notified and agreed. She will call Dr. Valentino Hue office for refill.

## 2022-06-03 NOTE — Telephone Encounter (Signed)
LMOM to return call.

## 2022-06-03 NOTE — Telephone Encounter (Signed)
Patient called and LM on Clinical Intake stating that she needs a refill on her Zolpidem.   Patient has a different PCP listed and saw Dr. Carolee Rota with [facility] on 03/17/2022 to Establish Care   Last OV with [facility] 12/03/2021  Tried calling patient, LMOM to return call.

## 2022-06-15 ENCOUNTER — Other Ambulatory Visit: Payer: Self-pay

## 2022-06-15 ENCOUNTER — Emergency Department (HOSPITAL_COMMUNITY)
Admission: EM | Admit: 2022-06-15 | Discharge: 2022-06-15 | Disposition: A | Discharge status: Home / Self Care | Payer: Medicare HMO | Attending: Emergency Medicine | Admitting: Emergency Medicine

## 2022-06-15 DIAGNOSIS — E039 Hypothyroidism, unspecified: Secondary | ICD-10-CM | POA: Insufficient documentation

## 2022-06-15 DIAGNOSIS — R42 Dizziness and giddiness: Secondary | ICD-10-CM | POA: Insufficient documentation

## 2022-06-15 DIAGNOSIS — E860 Dehydration: Secondary | ICD-10-CM | POA: Insufficient documentation

## 2022-06-15 DIAGNOSIS — I10 Essential (primary) hypertension: Secondary | ICD-10-CM | POA: Diagnosis not present

## 2022-06-15 LAB — COMPREHENSIVE METABOLIC PANEL
ALT: 17 U/L (ref 0–44)
AST: 18 U/L (ref 15–41)
Albumin: 3.7 g/dL (ref 3.5–5.0)
Alkaline Phosphatase: 58 U/L (ref 38–126)
Anion gap: 7 (ref 5–15)
BUN: 19 mg/dL (ref 8–23)
CO2: 26 mmol/L (ref 22–32)
Calcium: 9.1 mg/dL (ref 8.9–10.3)
Chloride: 108 mmol/L (ref 98–111)
Creatinine, Ser: 0.81 mg/dL (ref 0.44–1.00)
GFR, Estimated: >60 mL/min (ref >60)
Glucose, Bld: 102 mg/dL — ABNORMAL HIGH (ref 70–99)
Potassium: 3.9 mmol/L (ref 3.5–5.1)
Sodium: 141 mmol/L (ref 135–145)
Total Bilirubin: 0.5 mg/dL (ref 0.3–1.2)
Total Protein: 6.7 g/dL (ref 6.5–8.1)

## 2022-06-15 LAB — CBC WITH DIFFERENTIAL/PLATELET
Abs Immature Granulocytes: 0.01 10*3/uL (ref 0.00–0.07)
Basophils Absolute: 0.1 10*3/uL (ref 0.0–0.1)
Basophils Relative: 1 %
Eosinophils Absolute: 0.2 10*3/uL (ref 0.0–0.5)
Eosinophils Relative: 2 %
HCT: 40.6 % (ref 36.0–46.0)
Hemoglobin: 13.6 g/dL (ref 12.0–15.0)
Immature Granulocytes: 0 %
Lymphocytes Relative: 30 %
Lymphs Abs: 2.1 10*3/uL (ref 0.7–4.0)
MCH: 30.2 pg (ref 26.0–34.0)
MCHC: 33.5 g/dL (ref 30.0–36.0)
MCV: 90 fL (ref 80.0–100.0)
Monocytes Absolute: 0.5 10*3/uL (ref 0.1–1.0)
Monocytes Relative: 7 %
Neutro Abs: 4.2 10*3/uL (ref 1.7–7.7)
Neutrophils Relative %: 60 %
Platelets: 243 10*3/uL (ref 150–400)
RBC: 4.51 MIL/uL (ref 3.87–5.11)
RDW: 12.8 % (ref 11.5–15.5)
WBC: 7 10*3/uL (ref 4.0–10.5)
nRBC: 0 % (ref 0.0–0.2)

## 2022-06-15 LAB — TROPONIN I (HIGH SENSITIVITY): Troponin I (High Sensitivity): 2 ng/L (ref <18)

## 2022-06-15 LAB — LIPASE, BLOOD: Lipase: 48 U/L (ref 11–51)

## 2022-06-15 MED ORDER — SODIUM CHLORIDE 0.9 % IV BOLUS
1000.0000 mL | Freq: Once | INTRAVENOUS | Status: AC
  Administered 2022-06-15: 1000 mL via INTRAVENOUS

## 2022-06-15 NOTE — Discharge Instructions (Addendum)
Continue to follow with your primary care doctor.  Return with any new or suddenly worsening symptoms.

## 2022-06-15 NOTE — ED Provider Notes (Signed)
Emergency Department Provider Note   I have reviewed the triage vital signs and the nursing notes.   HISTORY  Chief Complaint Dizziness   HPI Darlene [name] is a 89 y.o. female with PMH of HTN, HLD, melanoma (currently undergoing immunotherapy) presents to the ED feeling lightheadedness. She has had similar symptoms and fatigue in the past related to melanoma therapy which feels similar. She feels especially lightheaded since 3AM today. She suspects that she may be dehydrated and notes in the past she often feels better with IVF. No vomiting or diarrhea. No HA, vision change, unilateral weakness/numbness. No specific CP but reports "being aware" of her chest at times and palpitations occasionally although not much today.    Past Medical History:  Diagnosis Date   Anxiety    Atypical mole 11/11/2020   Left Inner Knee (moderate-free)   Colon polyps    Depression    Hyperlipidemia    Hypertension    Hypothyroidism    Melanoma in situ (HCC) 09/19/2018   Right Arm (excision)   Skin cancer    Sleep apnea    Stroke [facility]) 2008    Review of Systems  Constitutional: No fever/chills. Positive weakness/lightheadedness.  Eyes: No visual changes. ENT: No sore throat. Cardiovascular: Positive chest awareness and occasional palpitations. Denies specific CP.  Respiratory: Denies shortness of breath. Gastrointestinal: No abdominal pain.  No nausea, no vomiting.  No diarrhea.  No constipation. Genitourinary: Negative for dysuria. Musculoskeletal: Negative for back pain. Skin: Negative for rash. Neurological: Negative for headaches, focal weakness or numbness.   ____________________________________________   PHYSICAL EXAM:  VITAL SIGNS: ED Triage Vitals  Enc Vitals Group     BP 06/15/22 1616 125/64     Pulse Rate 06/15/22 1616 67     Resp 06/15/22 1616 18     Temp 06/15/22 1616 98.1 F (36.7 C)     Temp Source 06/15/22 1616 Oral     SpO2 06/15/22 1616 94 %     Constitutional: Alert and oriented. Well appearing and in no acute distress. Eyes: Conjunctivae are normal. PERRL. EOMI. No nystagmus.  Head: Atraumatic. Nose: No congestion/rhinnorhea. Mouth/Throat: Mucous membranes are moist. Neck: No stridor.   Cardiovascular: Normal rate, regular rhythm. Good peripheral circulation. Grossly normal heart sounds.   Respiratory: Normal respiratory effort.  No retractions. Lungs CTAB. Gastrointestinal: Soft and nontender. No distention.  {Musculoskeletal: No lower extremity tenderness nor edema. No gross deformities of extremities. Neurologic:  Normal speech and language. No gross focal neurologic deficits are appreciated. 5/5 strength in the bilateral upper/lower extremities. No facial asymmetry. Normal finger to nose and heel to shin.  Skin:  Skin is warm, dry and intact. No rash noted.  ____________________________________________   LABS (all labs ordered are listed, but only abnormal results are displayed)  Labs Reviewed  COMPREHENSIVE METABOLIC PANEL - Abnormal; Notable for the following components:      Result Value   Glucose, Bld 102 (*)    All other components within normal limits  LIPASE, BLOOD  CBC WITH DIFFERENTIAL/PLATELET  URINALYSIS, ROUTINE W REFLEX MICROSCOPIC  TROPONIN I (HIGH SENSITIVITY)  TROPONIN I (HIGH SENSITIVITY)   ____________________________________________  EKG   EKG Interpretation  Date/Time:  Tuesday Jun 15 2022 17:18:29 EDT Ventricular Rate:  56 PR Interval:  190 QRS Duration: 84 QT Interval:  421 QTC Calculation: 407 R Axis:   22 Text Interpretation: Sinus rhythm Low voltage, precordial leads Borderline T abnormalities, anterior leads Confirmed by Alona Bene [phone]) on 06/15/2022 10:04:20 PM  ____________________________________________   PROCEDURES  Procedure(s) performed:   Procedures  None  ____________________________________________   INITIAL IMPRESSION / ASSESSMENT AND PLAN /  ED COURSE  Pertinent labs & imaging results that were available during my care of the patient were reviewed by me and considered in my medical decision making (see chart for details).   This patient is Presenting for Evaluation of lightheadedness, which does require a range of treatment options, and is a complaint that involves a high risk of morbidity and mortality.  The Differential Diagnoses include dehydration, near syncope, BPPV, CVA, arrhythmia, ACS, etc.  Critical Interventions-    Medications  sodium chloride 0.9 % bolus 1,000 mL (0 mLs Intravenous Stopped 06/15/22 1947)    Reassessment after intervention:  symptoms significantly improved after IVF.   I decided to review pertinent External Data, and in summary patient with prior ED visits for weakness in the past.   Clinical Laboratory Tests Ordered, included troponin negative. CBC without anemia or leukocytosis. No AKI.   Radiologic Tests: Considered neuro imaging but no focal deficits on exam or other history features to prompt this on an emergent basis.   Cardiac Monitor Tracing which shows NSR.    Social Determinants of Health Risk patient is a non-smoker.    Medical Decision Making: Summary:  Patient presents emergency department for evaluation of lightheadedness.  She denies any specific chest pain although has some awareness of her chest.  Plan to add on EKG and troponin in the setting.  No focal neurodeficits.  Patient advises that she often has similar symptoms and feels improved after IV fluids.  Plan to start with this along with some screening lab work and reassess prior to moving forward with additional imaging or workup.   Reevaluation with update and discussion with patient. She is feeling much better after IVF. Would like to defer UA and d/c home to follow with PCP and Oncology as an outpatient.   Considered admission but symptoms significantly improved.   Patient's presentation is most consistent with acute  presentation with potential threat to life or bodily function.   Disposition: discharge  ____________________________________________  FINAL CLINICAL IMPRESSION(S) / ED DIAGNOSES  Final diagnoses:  Lightheadedness  Dehydration    Note:  This document was prepared using Dragon voice recognition software and may include unintentional dictation errors.  Alona Bene, MD, [facility] Emergency Medicine    Michille Mcelrath, Arlyss Repress, MD 06/15/22 [phone]

## 2022-06-15 NOTE — ED Triage Notes (Signed)
EMS reports from [facility] c/o dizziness since this morning. Undergoing experimental treatment for melanoma, Pt states it makes her tired.  BP 130/62 RR 14 HR 68 Sp02 95 RA CBG 145

## 2022-07-15 ENCOUNTER — Encounter: Payer: Medicare HMO | Admitting: Adult Health

## 2022-07-16 NOTE — Progress Notes (Signed)
This encounter was created in error - please disregard.

## 2022-07-21 ENCOUNTER — Telehealth: Payer: Self-pay

## 2022-07-21 ENCOUNTER — Emergency Department (HOSPITAL_COMMUNITY)
Admission: EM | Admit: 2022-07-21 | Discharge: 2022-07-21 | Disposition: A | Discharge status: Home / Self Care | Payer: Medicare HMO | Attending: Emergency Medicine | Admitting: Emergency Medicine

## 2022-07-21 ENCOUNTER — Other Ambulatory Visit: Payer: Self-pay

## 2022-07-21 ENCOUNTER — Encounter (HOSPITAL_COMMUNITY): Payer: Self-pay

## 2022-07-21 DIAGNOSIS — Z8673 Personal history of transient ischemic attack (TIA), and cerebral infarction without residual deficits: Secondary | ICD-10-CM | POA: Diagnosis not present

## 2022-07-21 DIAGNOSIS — R220 Localized swelling, mass and lump, head: Secondary | ICD-10-CM

## 2022-07-21 DIAGNOSIS — K061 Gingival enlargement: Secondary | ICD-10-CM | POA: Diagnosis not present

## 2022-07-21 DIAGNOSIS — C439 Malignant melanoma of skin, unspecified: Secondary | ICD-10-CM | POA: Diagnosis not present

## 2022-07-21 DIAGNOSIS — Z85828 Personal history of other malignant neoplasm of skin: Secondary | ICD-10-CM | POA: Insufficient documentation

## 2022-07-21 DIAGNOSIS — E039 Hypothyroidism, unspecified: Secondary | ICD-10-CM | POA: Insufficient documentation

## 2022-07-21 DIAGNOSIS — K1379 Other lesions of oral mucosa: Secondary | ICD-10-CM | POA: Diagnosis present

## 2022-07-21 DIAGNOSIS — I10 Essential (primary) hypertension: Secondary | ICD-10-CM | POA: Diagnosis not present

## 2022-07-21 LAB — COMPREHENSIVE METABOLIC PANEL
ALT: 16 U/L (ref 0–44)
AST: 18 U/L (ref 15–41)
Albumin: 3.9 g/dL (ref 3.5–5.0)
Alkaline Phosphatase: 67 U/L (ref 38–126)
Anion gap: 10 (ref 5–15)
BUN: 17 mg/dL (ref 8–23)
CO2: 23 mmol/L (ref 22–32)
Calcium: 9.3 mg/dL (ref 8.9–10.3)
Chloride: 107 mmol/L (ref 98–111)
Creatinine, Ser: 0.69 mg/dL (ref 0.44–1.00)
GFR, Estimated: >60 mL/min (ref >60)
Glucose, Bld: 117 mg/dL — ABNORMAL HIGH (ref 70–99)
Potassium: 3.7 mmol/L (ref 3.5–5.1)
Sodium: 140 mmol/L (ref 135–145)
Total Bilirubin: 0.7 mg/dL (ref 0.3–1.2)
Total Protein: 6.7 g/dL (ref 6.5–8.1)

## 2022-07-21 LAB — CBC WITH DIFFERENTIAL/PLATELET
Abs Immature Granulocytes: 0.01 10*3/uL (ref 0.00–0.07)
Basophils Absolute: 0 10*3/uL (ref 0.0–0.1)
Basophils Relative: 1 %
Eosinophils Absolute: 0.2 10*3/uL (ref 0.0–0.5)
Eosinophils Relative: 3 %
HCT: 41.4 % (ref 36.0–46.0)
Hemoglobin: 13.7 g/dL (ref 12.0–15.0)
Immature Granulocytes: 0 %
Lymphocytes Relative: 32 %
Lymphs Abs: 2 10*3/uL (ref 0.7–4.0)
MCH: 29.9 pg (ref 26.0–34.0)
MCHC: 33.1 g/dL (ref 30.0–36.0)
MCV: 90.4 fL (ref 80.0–100.0)
Monocytes Absolute: 0.5 10*3/uL (ref 0.1–1.0)
Monocytes Relative: 8 %
Neutro Abs: 3.5 10*3/uL (ref 1.7–7.7)
Neutrophils Relative %: 56 %
Platelets: 243 10*3/uL (ref 150–400)
RBC: 4.58 MIL/uL (ref 3.87–5.11)
RDW: 12.8 % (ref 11.5–15.5)
WBC: 6.3 10*3/uL (ref 4.0–10.5)
nRBC: 0 % (ref 0.0–0.2)

## 2022-07-21 LAB — TROPONIN I (HIGH SENSITIVITY): Troponin I (High Sensitivity): <2 ng/L (ref <18)

## 2022-07-21 MED ORDER — SODIUM CHLORIDE 0.9 % IV BOLUS
500.0000 mL | Freq: Once | INTRAVENOUS | Status: AC
  Administered 2022-07-21: 500 mL via INTRAVENOUS

## 2022-07-21 NOTE — Transitions of Care (Post Inpatient/ED Visit) (Signed)
   07/21/2022  Name: Darlene [name] MRN: [id number] DOB: [birth year]  Today's TOC FU Call Status:    Transition Care Management Follow-up Telephone Call    Items Reviewed:    Medications Reviewed Today: Medications Reviewed Today     Reviewed by Yates Decamp, MD (Physician) on 03/29/22 at [phone]  Med List Status: <None>   Medication Order Taking? Sig Documenting Provider Last Dose Status Informant  acetaminophen (TYLENOL) 325 MG tablet [id number] Yes Take 650 mg by mouth every 6 (six) hours as needed for headache (pain). [provider] Taking Active Self  cephALEXin (KEFLEX) 500 MG capsule [id number] Yes Take 500 mg by mouth 2 (two) times daily. [provider] Taking Active   clopidogrel (PLAVIX) 75 MG tablet [id number] Yes TAKE 1 TABLET BY MOUTH EVERY DAY [name], [name], NP Taking Active   diazepam (VALIUM) 5 MG tablet [id number] No Take 0.5 tablets (2.5 mg total) by mouth as needed (anxiety). WILL NEED APPOINTMENT FOR ADDITIONAL REFILLS  Patient not taking: Reported on 03/29/2022   [name], [name], NP Not Taking Active Self, Pharmacy Records  ketoconazole (NIZORAL) 2 % shampoo [id number] Yes APPLY 1 APPLICATION TOPICALLY TWICE WEEKLY AS NEEDED FOR SCALP IRRITATION. Janalyn Harder, MD Taking Active Self, Pharmacy Records  levothyroxine (SYNTHROID) 100 MCG tablet [id number] Yes TAKE 1 TABLET BY MOUTH EVERY DAY BEFORE BREAKFAST [name], [name], NP Taking Active   losartan (COZAAR) 50 MG tablet [id number] Yes Take 1 tablet (50 mg total) by mouth daily. Nori Riis, NP Taking Active   nitroGLYCERIN (NITROSTAT) 0.4 MG SL tablet [id number] Yes Place 1 tablet (0.4 mg total) under the tongue every 5 (five) minutes as needed for up to 25 days for chest pain. Nori Riis, NP Taking Active   omeprazole (PRILOSEC) 40 MG capsule [id number] Yes Take 40 mg by mouth daily. [provider] Taking Active   Polyethyl Glycol-Propyl Glycol (SYSTANE) 0.4-0.3 % GEL ophthalmic gel [id number]  Yes Place 1 application. into both eyes at bedtime. [provider] Taking Active Self  pravastatin (PRAVACHOL) 40 MG tablet [id number] Yes Take 1 tablet (40 mg total) by mouth daily. Yates Decamp, MD Taking Active   Zolpidem Tartrate 3.5 MG SUBL [id number] Yes Take 1 tablet by mouth at bedtime as needed. [name], [name], NP Taking Active   Med List Note Gorden Harms, CPhT 06/06/21 1308): Resident of [facility]            Home Care and Equipment/Supplies:    Functional Questionnaire:    Follow up appointments reviewed:      SIGNATURE.: Guss Bunde [facility]

## 2022-07-21 NOTE — ED Provider Notes (Signed)
Emergency Department Provider Note   I have reviewed the triage vital signs and the nursing notes.   HISTORY  Chief Complaint Dehydration   HPI Darlene [name] is a 89 y.o. female with PMH of malignant melanoma on immunotherapy through the [facility] system presents with continued sensation of swelling in the mouth.  She has had weeks to months of symptoms with no clear diagnosis.  She has been followed by her oncology, ENT, dental team at [facility].  They have discussed several possibilities for this swelling but no clear diagnosis made as of yet.  She has had several ED presentations for this and tends to feel improved after IV fluids.  She has received Decadron in the past but does not recall this helping.  Denies any shortness of breath, vomiting, chest pain, abdominal pain.  No sensation of tongue swelling.  Most of her discomfort is feeling of swelling between her lower gums and lip. No rash.    Past Medical History:  Diagnosis Date   Anxiety    Atypical mole 11/11/2020   Left Inner Knee (moderate-free)   Colon polyps    Depression    Hyperlipidemia    Hypertension    Hypothyroidism    Melanoma in situ (HCC) 09/19/2018   Right Arm (excision)   Skin cancer    Sleep apnea    Stroke [facility]) 2008    Review of Systems  Constitutional: No fever/chills Cardiovascular: Denies chest pain. Respiratory: Denies shortness of breath. Gastrointestinal: No abdominal pain.  No nausea, no vomiting. Musculoskeletal: Negative for back pain. Skin: Negative for rash. Neurological: Negative for headaches.  ____________________________________________   PHYSICAL EXAM:  VITAL SIGNS: ED Triage Vitals  Enc Vitals Group     BP 07/21/22 0449 (!) 169/80     Pulse Rate 07/21/22 0449 71     Resp 07/21/22 0449 18     Temp 07/21/22 0449 97.7 F (36.5 C)     Temp Source 07/21/22 0449 Oral     SpO2 07/21/22 0449 96 %   Constitutional: Alert and oriented. Well appearing and in  no acute distress. Eyes: Conjunctivae are normal.  Head: Atraumatic. Nose: No congestion/rhinnorhea. Mouth/Throat: Mucous membranes are moist. No angioedema noted. No PTA. Clear voice. Managing oral secretions.  Neck: No stridor.  Cardiovascular: Normal rate, regular rhythm. Good peripheral circulation. Grossly normal heart sounds.   Respiratory: Normal respiratory effort.  No retractions. Lungs CTAB. Gastrointestinal: Soft and nontender. No distention.  Musculoskeletal: No lower extremity tenderness nor edema. Neurologic:  Normal speech and language.  Skin:  Skin is warm, dry and intact. No rash noted.  ____________________________________________   LABS (all labs ordered are listed, but only abnormal results are displayed)  Labs Reviewed  COMPREHENSIVE METABOLIC PANEL - Abnormal; Notable for the following components:      Result Value   Glucose, Bld 117 (*)    All other components within normal limits  CBC WITH DIFFERENTIAL/PLATELET  TROPONIN I (HIGH SENSITIVITY)   ____________________________________________   PROCEDURES  Procedure(s) performed:   Procedures  None  ____________________________________________   INITIAL IMPRESSION / ASSESSMENT AND PLAN / ED COURSE  Pertinent labs & imaging results that were available during my care of the patient were reviewed by me and considered in my medical decision making (see chart for details).   This patient is Presenting for Evaluation of mouth swelling, which does require a range of treatment options, and is a complaint that involves a high risk of morbidity and mortality.  The  Differential Diagnoses include angioedema, anaphylaxis, oral cancer, gingivitis, etc.  Critical Interventions-    Medications  sodium chloride 0.9 % bolus 500 mL (0 mLs Intravenous Stopped 07/21/22 0615)    Reassessment after intervention: symptoms improved.    Clinical Laboratory Tests Ordered, included CMP without AKI. CBC without leukocytosis  or anemia. Troponin negative.   Cardiac Monitor Tracing which shows NSR.   Medical Decision Making: Summary:  Patient returns emergency department with continued sensation of swelling in her mouth and gums.  I do not appreciate any angioedema.  Her posterior pharynx is widely patent.  She does not appear to be experiencing anaphylaxis requiring epinephrine.  Plan for IV fluids.  She is tolerating liquids by mouth.    Reevaluation with update and discussion with patient. Labs are reassuring. Advised close PCP and specialist follow up in the [facility] system.   Patient's presentation is most consistent with acute presentation with potential threat to life or bodily function.   Disposition: discharge  ____________________________________________  FINAL CLINICAL IMPRESSION(S) / ED DIAGNOSES  Final diagnoses:  Swelling of gingiva     Note:  This document was prepared using Dragon voice recognition software and may include unintentional dictation errors.  Alona Bene, MD, [facility] Emergency Medicine    Arne Schlender, Arlyss Repress, MD 07/22/22 [phone]

## 2022-07-21 NOTE — Discharge Instructions (Signed)
Please continue to follow up with your [facility] team.

## 2022-07-21 NOTE — ED Notes (Signed)
Attempted to call and give report to [facility] x3 without any answer.

## 2022-07-21 NOTE — ED Triage Notes (Signed)
Pt to ED by EMS from home with c/o mouth discomfort and dehydration. Pt has melanoma and is currently receiving immunotherapy. Arrives A+O, VSS, NADN.

## 2022-07-29 ENCOUNTER — Non-Acute Institutional Stay: Payer: Medicare HMO | Admitting: Nurse Practitioner

## 2022-07-29 ENCOUNTER — Encounter: Payer: Self-pay | Admitting: Nurse Practitioner

## 2022-07-29 ENCOUNTER — Encounter: Payer: Medicare HMO | Admitting: Nurse Practitioner

## 2022-07-29 VITALS — BP 131/88 | HR 77 | Temp 97.5°F | Resp 18 | Ht 63.0 in | Wt 154.4 lb

## 2022-07-29 DIAGNOSIS — E039 Hypothyroidism, unspecified: Secondary | ICD-10-CM

## 2022-07-29 DIAGNOSIS — C300 Malignant neoplasm of nasal cavity: Secondary | ICD-10-CM | POA: Diagnosis not present

## 2022-07-29 DIAGNOSIS — F419 Anxiety disorder, unspecified: Secondary | ICD-10-CM | POA: Diagnosis not present

## 2022-07-29 DIAGNOSIS — Z8673 Personal history of transient ischemic attack (TIA), and cerebral infarction without residual deficits: Secondary | ICD-10-CM

## 2022-07-29 DIAGNOSIS — I10 Essential (primary) hypertension: Secondary | ICD-10-CM | POA: Diagnosis not present

## 2022-07-29 NOTE — Assessment & Plan Note (Signed)
blood pressure is controlled on Losartan qd. added Imdur 30mg  tid by Cardiology. Followed by Cardiology. CTA negative 09/01/20. Bun/creat 17/0.69 07/21/22

## 2022-07-29 NOTE — Assessment & Plan Note (Addendum)
on Levothyroxine, TSH 0.168 02/18/22, repeat TSH 08/03/22 TSH 0.17, reduce Levothyroxine po every day, repeat TSH 6 weeks.  08/13/22 the patient declined the above. Repeat TSH 6 weeks.

## 2022-07-29 NOTE — Assessment & Plan Note (Signed)
followed by oncology, ENT

## 2022-07-29 NOTE — Progress Notes (Addendum)
Location:   Clinic [facility]   Place of Service:  Clinic (12) Provider: Chipper Oman NP  Code Status: DNR Goals of Care:     07/29/2022   10:04 AM  Advanced Directives  Does Patient Have a Medical Advance Directive? Yes  Type of Advance Directive Healthcare Power of Attorney  Does patient want to make changes to medical advance directive? No - Patient declined  Copy of Healthcare Power of Attorney in Chart? Yes - validated most recent copy scanned in chart (See row information)  Would patient like information on creating a medical advance directive? No - Patient declined     Chief Complaint  Patient presents with   Transitions Of [facility] follow-up    HPI: Patient is a 89 y.o. 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               Sleeps too much during day, failed dc Ambien, Valium, refused CPAP, feels a veil in front of left eye, more noticeable left facial weakness. CT head was not done since had CT sinuses/face at [facility].  Hx of chronic rhinitis/sphenoidal sinusitis, s/p surgery, improved SOB, wheezing,  on Astelin, Flonase, Bronchodilator, underwent Pulmonology, ENT, GI, Allergy evaluation, had spirometry 10/20/20, Echo 60-65% EF 04/16/20, CTA 09/01/20. Saw ENT Incidental finding of melanoma in nose, follows oncology             Chronic sore throat: multiple factorials, GERD vs chronic rhinitis/sinusitis,  ENT  Insomnia Ambien, Valium             Hx of CVA, on plavix. Statin,  residual of  poor left peripheral vision  Hypothyroidism, on Levothyroxine, TSH 0.168 02/18/22             HTN, blood pressure is controlled on Losartan qd. added Imdur 30mg  tid by Cardiology. Followed by Cardiology. CTA negative 09/01/20. Bun/creat 17/0.69 07/21/22            GERD, stable Omeprazole,  FDgard helped,  saw GI, EGD 12/26/19. Vit B12 383 12/02/20, Hgb 13.7 07/21/22. GI 09/03/21, pending repeat endoscope.              Lactose intolerance, diet adjustment, f/u GI             Esophageal dysmotility, Swallow studay 05/12/20, worked with ST, GI f/u             OSA sleep study, saw Pulmonology.  Not using CPAP                                     OA, saw Ortho. Cervical spinal stenosis, MR 02/26/20, C6-C7             Hyperlipidemia, takes Pravastatin, LDL 94 05/12/21             Urinary frequency/leakage, 2-3x/night, stopped taking Gemtesa, acupuncture improved initially. Saw urology.              Osteopenia, DEXA 12/25/20, t score -1.0,  10 years possibility major fx 11%, hip fx 2.6%. Vit D, Cal-diet rich,  Vit D 3 39 12/26/20             HOH audiology eval, ENT placed tube in the  right ear-failed-again, hearing aids     Past Medical History:  Diagnosis Date   Anxiety    Atypical mole 11/11/2020   Left Inner Knee (moderate-free)   Colon polyps    Depression    Hyperlipidemia    Hypertension    Hypothyroidism    Melanoma in situ (HCC) 09/19/2018   Right Arm (excision)   Skin cancer    Sleep apnea    Stroke [facility]) 2008    Past Surgical History:  Procedure Laterality Date   ADENOIDECTOMY     APPENDECTOMY  1960   CATARACT EXTRACTION Bilateral    COLONOSCOPY     GUM SURGERY  03/2019   LUMBAR DISC SURGERY  2014   MELANOMA EXCISION  2020   MESH APPLIED TO LAP PORT     SINUS EXPLORATION     11/17/2021 operation for right nasal cavity and 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   TONSILLECTOMY  1940    Allergies  Allergen Reactions   Lactose Intolerance (Gi) Other (See Comments)    indigestion   Norco [Hydrocodone-Acetaminophen] Nausea And Vomiting   Dust Mite Extract Other (See Comments)    Sinus drainage   Oxycontin [Oxycodone Hcl] Nausea And Vomiting   Pollen Extract Other (See Comments)    Sinus drainage    Allergies as of 07/29/2022        Reactions   Lactose Intolerance (gi) Other (See Comments)   indigestion   Norco [hydrocodone-acetaminophen] Nausea And Vomiting   Dust Mite Extract Other (See Comments)   Sinus drainage   Oxycontin [oxycodone Hcl] Nausea And Vomiting   Pollen Extract Other (See Comments)   Sinus drainage        Medication List        Accurate as of July 29, 2022 11:59 PM. If you have any questions, ask your nurse or doctor.          STOP taking these medications    cephALEXin 500 MG capsule Commonly known as: KEFLEX Stopped by: Rilley Stash X Nida Manfredi   diazepam 5 MG tablet Commonly known as: VALIUM Stopped by: Tashiana Lamarca X Nayan Proch       TAKE these medications    acetaminophen 325 MG tablet Commonly known as: TYLENOL Take 650 mg by mouth every 6 (six) hours as needed for headache (pain).   clopidogrel 75 MG tablet Commonly known as: PLAVIX TAKE 1 TABLET BY MOUTH EVERY DAY   ezetimibe 10 MG tablet Commonly known as: ZETIA Take 1 tablet (10 mg total) by mouth every evening.   ketoconazole 2 % shampoo Commonly known as: NIZORAL APPLY 1 APPLICATION TOPICALLY TWICE WEEKLY AS NEEDED FOR SCALP IRRITATION.   losartan 50 MG tablet Commonly known as: COZAAR Take 1 tablet (50 mg total) by mouth daily.   nitroGLYCERIN 0.4 MG SL tablet Commonly known as: NITROSTAT Place 1 tablet (0.4 mg total) under the tongue every 5 (five) minutes as needed for up to 25 days for chest pain.   omeprazole 40 MG capsule Commonly known as: PRILOSEC TAKE 1 CAPSULE TWICE A DAY BEFORE A MEAL   pravastatin 40 MG tablet Commonly known as: PRAVACHOL Take 1 tablet (40 mg total) by mouth daily.   Synthroid 100 MCG tablet Generic drug: levothyroxine TAKE 1 TABLET BY MOUTH EVERY DAY BEFORE BREAKFAST What changed: Another medication with the same name was added. Make sure you understand how and when to take each. Changed by: Binyamin Nelis X Laytoya Ion   levothyroxine 100 MCG tablet Commonly known  as: SYNTHROID Take 1 tablet (100 mcg  total) by mouth daily. What changed: You were already taking a medication with the same name, and this prescription was added. Make sure you understand how and when to take each. Changed by: Liset Mcmonigle X Jodel Mayhall   Systane 0.4-0.3 % Gel ophthalmic gel Generic drug: Polyethyl Glycol-Propyl Glycol Place 1 application. into both eyes at bedtime.   Zolpidem Tartrate 3.5 MG Subl TAKE 1 TABLET BY MOUTH EVERY DAY AT BEDTIME AS NEEDED        Review of Systems:  Review of Systems  Constitutional:  Positive for fatigue. Negative for fever.  HENT:  Positive for congestion and hearing loss. Negative for nosebleeds, sinus pressure, sore throat and voice change.        Chronic sinusitis, underwent ENT, sleep study-sleep apnea. Gum irritation  Eyes:  Positive for visual disturbance.       Left lateral visual field deficit. Dry eyes  Respiratory:  Negative for cough and shortness of breath.   Cardiovascular:  Negative for leg swelling.  Gastrointestinal:  Negative for abdominal pain and constipation.  Genitourinary:  Positive for frequency. Negative for dysuria and urgency.       Urinary leakage. Doing Kegel exercise.   Musculoskeletal:  Negative for back pain and gait problem.  Skin:  Negative for color change.       Mohs dorsum right hand, healed.   Neurological:  Negative for speech difficulty, weakness and headaches.  Psychiatric/Behavioral:  Positive for sleep disturbance. Negative for dysphoric mood. The patient is not nervous/anxious.        Early am awake, difficulty returning asleep.     Health Maintenance  Topic Date Due   DTaP/Tdap/Td (2 - Td or Tdap) 11/26/2018   COVID-19 Vaccine (4 - 2023-24 season) 09/18/2021   Colonoscopy  10/25/2021   Medicare Annual Wellness (AWV)  03/05/2022   INFLUENZA VACCINE  08/19/2022   Pneumonia Vaccine 63+ Years old  Completed   DEXA SCAN  Completed   Zoster Vaccines- Shingrix  Completed   HPV VACCINES  Aged Out    Physical Exam: Vitals:   07/29/22  1005  BP: 131/88  Pulse: 77  Resp: 18  Temp: (!) 97.5 F (36.4 C)  SpO2: 95%  Weight: 154 lb 6.4 oz (70 kg)  Height: 5\' 3"  (1.6 m)   Body mass index is 27.35 kg/m. Physical Exam Vitals and nursing note reviewed.  Constitutional:      Appearance: Normal appearance.  HENT:     Head: Normocephalic and atraumatic.     Ears:     Comments: Right ear tube placed, mild erythema, no bleeding.     Nose: Nose normal.     Comments: Redness right nostril, no bleed.     Mouth/Throat:     Mouth: Mucous membranes are moist.     Pharynx: No oropharyngeal exudate or posterior oropharyngeal erythema.  Eyes:     Extraocular Movements: Extraocular movements intact.     Conjunctiva/sclera: Conjunctivae normal.     Pupils: Pupils are equal, round, and reactive to light.     Comments: Left peripheral visual field deficit since CVA 2006  Cardiovascular:     Rate and Rhythm: Normal rate and regular rhythm.     Heart sounds: No murmur heard. Pulmonary:     Effort: Pulmonary effort is normal.     Breath sounds: Rales present.     Comments: Bibasilar rales.  Abdominal:     General: Bowel sounds are normal.  Palpations: Abdomen is soft.     Tenderness: There is no abdominal tenderness.  Musculoskeletal:     Cervical back: Normal range of motion and neck supple.     Right lower leg: No edema.     Left lower leg: No edema.  Skin:    General: Skin is warm and dry.  Neurological:     General: No focal deficit present.     Mental Status: She is alert and oriented to person, place, and time. Mental status is at baseline.     Motor: Weakness present.     Gait: Gait normal.     Comments: Left facial weakness  Psychiatric:        Mood and Affect: Mood normal.        Behavior: Behavior normal.        Thought Content: Thought content normal.        Judgment: Judgment normal.     Labs reviewed: Basic Metabolic Panel: Recent Labs    02/18/22 1148 02/18/22 1235 06/15/22 1706 07/21/22 0522  08/03/22 0729  NA 141  --  141 140  --   K 3.9  --  3.9 3.7  --   CL 106  --  108 107  --   CO2 27  --  26 23  --   GLUCOSE 97  --  102* 117*  --   BUN 14  --  19 17  --   CREATININE 0.74  --  0.81 0.69  --   CALCIUM 9.4  --  9.1 9.3  --   TSH  --  0.168*  --   --  0.17*   Liver Function Tests: Recent Labs    02/18/22 1148 06/15/22 1706 07/21/22 0522  AST 17 18 18   ALT 15 17 16   ALKPHOS 69 58 67  BILITOT 0.9 0.5 0.7  PROT 7.4 6.7 6.7  ALBUMIN 4.1 3.7 3.9   Recent Labs    06/15/22 1706  LIPASE 48   No results for input(s): "AMMONIA" in the last 8760 hours. CBC: Recent Labs    02/18/22 1148 06/15/22 1706 07/21/22 0522  WBC 6.5 7.0 6.3  NEUTROABS 3.9 4.2 3.5  HGB 14.3 13.6 13.7  HCT 43.6 40.6 41.4  MCV 90.5 90.0 90.4  PLT 263 243 243   Lipid Panel: No results for input(s): "CHOL", "HDL", "LDLCALC", "TRIG", "CHOLHDL", "LDLDIRECT" in the last 8760 hours. Lab Results  Component Value Date   HGBA1C 6.1 (H) 06/07/2021    Procedures since last visit: No results found.  Assessment/Plan  Acquired hypothyroidism  on Levothyroxine, TSH 0.168 02/18/22, repeat TSH 08/03/22 TSH 0.17, reduce Levothyroxine po every day, repeat TSH 6 weeks.  08/13/22 the patient declined the above. Repeat TSH 6 weeks.   Essential hypertension  blood pressure is controlled on Losartan qd. added Imdur 30mg  tid by Cardiology. Followed by Cardiology. CTA negative 09/01/20. Bun/creat 17/0.69 07/21/22  Malignant melanoma of nasal cavity (HCC)  followed by oncology, ENT  Anxiety  Valium available to her.   History of stroke Hx of CVA, on plavix. Statin,  residual of  poor left peripheral vision    Labs/tests ordered:  none  Next appt:  prn

## 2022-07-29 NOTE — Assessment & Plan Note (Signed)
Valium available to her.

## 2022-07-29 NOTE — Assessment & Plan Note (Signed)
Hx of CVA, on plavix. Statin,  residual of  poor left peripheral vision  ?

## 2022-08-03 ENCOUNTER — Other Ambulatory Visit: Payer: Self-pay | Admitting: Nurse Practitioner

## 2022-08-03 ENCOUNTER — Other Ambulatory Visit: Payer: Medicare HMO

## 2022-08-03 ENCOUNTER — Telehealth: Payer: Self-pay | Admitting: Nurse Practitioner

## 2022-08-03 DIAGNOSIS — E039 Hypothyroidism, unspecified: Secondary | ICD-10-CM

## 2022-08-03 LAB — TSH: TSH: 0.17 mIU/L — ABNORMAL LOW (ref 0.40–4.50)

## 2022-08-03 MED ORDER — LEVOTHYROXINE SODIUM 75 MCG PO TABS
37.5000 ug | ORAL_TABLET | Freq: Every day | ORAL | 3 refills | Status: DC
Start: 2022-08-03 — End: 2022-08-13

## 2022-08-04 ENCOUNTER — Encounter: Payer: Self-pay | Admitting: Gastroenterology

## 2022-08-04 ENCOUNTER — Ambulatory Visit: Payer: Medicare HMO | Admitting: Gastroenterology

## 2022-08-04 ENCOUNTER — Other Ambulatory Visit: Payer: Self-pay

## 2022-08-04 VITALS — BP 116/64 | HR 82 | Ht 63.0 in | Wt 153.1 lb

## 2022-08-04 DIAGNOSIS — R1319 Other dysphagia: Secondary | ICD-10-CM

## 2022-08-04 DIAGNOSIS — C312 Malignant neoplasm of frontal sinus: Secondary | ICD-10-CM

## 2022-08-04 DIAGNOSIS — R140 Abdominal distension (gaseous): Secondary | ICD-10-CM

## 2022-08-04 DIAGNOSIS — R1013 Epigastric pain: Secondary | ICD-10-CM | POA: Diagnosis not present

## 2022-08-04 DIAGNOSIS — K219 Gastro-esophageal reflux disease without esophagitis: Secondary | ICD-10-CM | POA: Diagnosis not present

## 2022-08-04 DIAGNOSIS — C434 Malignant melanoma of scalp and neck: Secondary | ICD-10-CM

## 2022-08-04 DIAGNOSIS — G8929 Other chronic pain: Secondary | ICD-10-CM

## 2022-08-04 DIAGNOSIS — E039 Hypothyroidism, unspecified: Secondary | ICD-10-CM

## 2022-08-04 MED ORDER — OMEPRAZOLE 40 MG PO CPDR: 40.0000 mg | DELAYED_RELEASE_CAPSULE | ORAL | 3 refills | Status: DC | PRN

## 2022-08-04 NOTE — Progress Notes (Signed)
Use since of periodontal  GASTROENTEROLOGY OUTPATIENT CLINIC VISIT   Primary Care Provider [name], [name], NP [address] [place] [address] [phone]  Patient Profile: Darlene [name] is a 89 y.o. female with a pmh significant for CVA (on Plavix), atrial fibrillation, anxiety/depression, sleep apnea, status post appendectomy, seborrheic keratoses (seen dermatology in the past), chronic sore throat, mucosal melanoma, esophagitis, hiatal hernia, diverticulosis, hemorrhoids, colonic AVM, adenomatous colon polyps.  The patient presents to the [facility] for an evaluation and management of problem(s) noted below:  Problem List 1. Other dysphagia   2. Gastroesophageal reflux disease, unspecified whether esophagitis present   3. Abdominal pain, chronic, epigastric   4. Abdominal bloating   5. 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   Interval History The patient returns for follow-up.  At the end of last year she was diagnosed with mucosal melanoma within her nasal cavity.  She is on immunotherapy at this point through [facility].  She feels she is having side effects and is not sure if the therapy is working.  She is now having issues with swelling in her gums and is concerned whether she could have disease in that area too.  She is due to see her oncologist soon to evaluate if she may need a biopsy.  Her esophageal dysphagia symptoms have not been a significant issue currently.  She is still having some reflux at times.  Epigastric abdominal discomfort is rare but present at times and is postprandial.  Her last endoscopy was reviewed from 3 years ago and she wonders whether there is any chance something has developed or changed.  GI Review of Systems Positive as above Negative for odynophagia, change in bowel habits, melena, hematochezia  Review of  Systems General: Denies fevers/chills/weight loss unintentionally Cardiovascular: Denies chest pain Pulmonary: Denies shortness of breath Gastroenterological: See HPI Genitourinary: Denies darkened urine Hematological: Positive for history of easy bruising/bleeding due to Plavix Dermatological: Denies jaundice Psychological: Mood is stable   Medications Current Outpatient Medications  Medication Sig Dispense Refill   acetaminophen (TYLENOL) 325 MG tablet Take 650 mg by mouth every 6 (six) hours as needed for headache (pain).     calcium carbonate (TUMS - DOSED IN MG ELEMENTAL CALCIUM) 500 MG chewable tablet Chew 1 tablet by mouth as needed for indigestion or heartburn.     clopidogrel (PLAVIX) 75 MG tablet TAKE 1 TABLET BY MOUTH EVERY DAY 90 tablet 1   cycloSPORINE (RESTASIS) 0.05 % ophthalmic emulsion Place 1 drop into both eyes as needed.     ketoconazole (NIZORAL) 2 % shampoo APPLY 1 APPLICATION TOPICALLY TWICE WEEKLY AS NEEDED FOR SCALP IRRITATION. 120 mL 1   levothyroxine (SYNTHROID) 75 MCG tablet Take 0.5 tablets (37.5 mcg total) by mouth daily before breakfast. 90 tablet 3   losartan (COZAAR) 50 MG tablet Take 1 tablet (50 mg total) by mouth daily. 90 tablet 3   nitroGLYCERIN (NITROSTAT) 0.4 MG SL tablet Place 1 tablet (0.4 mg total) under the tongue every 5 (five) minutes as needed for up to 25 days for chest pain. 25 tablet 3   Polyethyl Glycol-Propyl Glycol (SYSTANE) 0.4-0.3 % GEL ophthalmic gel Place 1 application. into both eyes at bedtime.     pravastatin (PRAVACHOL) 40 MG tablet Take 1 tablet (40 mg total) by mouth daily. 90 tablet 2   Zolpidem Tartrate 3.5 MG SUBL  TAKE 1 TABLET BY MOUTH EVERY DAY AT BEDTIME AS NEEDED 30 tablet 0   omeprazole (PRILOSEC) 40 MG capsule Take 1 capsule (40 mg total) by mouth as needed. 90 capsule 3   No current facility-administered medications for this visit.    Allergies Allergies  Allergen Reactions   Lactose Intolerance (Gi) Other (See  Comments)    indigestion   Norco [Hydrocodone-Acetaminophen] Nausea And Vomiting   Dust Mite Extract Other (See Comments)    Sinus drainage   Oxycontin [Oxycodone Hcl] Nausea And Vomiting   Pollen Extract Other (See Comments)    Sinus drainage    Histories Past Medical History:  Diagnosis Date   Anxiety    Atypical mole 11/11/2020   Left Inner Knee (moderate-free)   Colon polyps    Depression    Hyperlipidemia    Hypertension    Hypothyroidism    Melanoma in situ (HCC) 09/19/2018   Right Arm (excision)   Skin cancer    Sleep apnea    Stroke [facility]) 2008   Past Surgical History:  Procedure Laterality Date   ADENOIDECTOMY     APPENDECTOMY  1960   CATARACT EXTRACTION Bilateral    COLONOSCOPY     GUM SURGERY  03/2019   LUMBAR DISC SURGERY  2014   MELANOMA EXCISION  2020   MESH APPLIED TO LAP PORT     SINUS EXPLORATION     11/17/2021 operation for right nasal cavity and 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   TONSILLECTOMY  1940   Social History   Socioeconomic History   Marital status: Widowed    Spouse name: Not on file   Number of children: 3   Years of education: Not on file   Highest education level: Not on file  Occupational History   Occupation: retired Chemical engineer  Tobacco Use   Smoking status: Never   Smokeless tobacco: Never  Vaping Use   Vaping status: Never Used  Substance and Sexual Activity   Alcohol use: Not Currently    Comment: hardly ever   Drug use: Never   Sexual activity: Not Currently  Other Topics Concern   Not on file  Social History Narrative   Social History      Diet? ok      Do you drink/eat things with caffeine? Weak coffee, (much milk)      Marital status?          divorced                          What year were you married? 1961      Do you live in a house, apartment, assisted living, condo, trailer, etc.? apartment      Is it one or more stories? 4      How many persons  live in your home? Only me      Do you have any pets in your home? (please list) no      Highest level of education completed? 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             yes                         Type & how often? Walk- would love to return to water exercise  Advanced Directives      Do you have a living will? yes      Do you have a DNR form?           no                       If not, do you want to discuss one? no      Do you have signed POA/HPOA for forms? no      Functional Status      Do you have difficulty bathing or dressing yourself?  no      Do you have difficulty preparing food or eating? no      Do you have difficulty managing your medications? no      Do you have difficulty managing your finances?  no      Do you have difficulty affording your medications? no      Social Determinants of Health   Financial Resource Strain: Not on file  Food Insecurity: Not on file  Transportation Needs: Not on file  Physical Activity: Not on file  Stress: Not on file  Social Connections: Unknown (08/10/2021)   Received from [facility]   Social Network    Social Network: Not on file  Intimate Partner Violence: Unknown (08/10/2021)   Received from [facility]   HITS    Physically Hurt: Not on file    Insult or Talk Down To: Not on file    Threaten Physical Harm: Not on file    Scream or Curse: Not on file   Family History  Problem Relation Age of Onset   Lung cancer Mother 10       smoker   Heart disease Mother    Stroke Father 8   Heart attack Father    Allergic rhinitis Sister    Hypertension Sister    CVA Brother    Lung cancer Maternal Grandfather    Heart attack Paternal Grandfather    Melanoma Daughter    Psoriasis Daughter    Rheum arthritis Daughter    Bipolar disorder Daughter    Colon cancer Neg Hx    Esophageal cancer Neg Hx    Inflammatory bowel disease Neg  Hx    Liver disease Neg Hx    Pancreatic cancer Neg Hx    Stomach cancer Neg Hx    Rectal cancer Neg Hx    I have reviewed her medical, social, and family history in detail and updated the electronic medical record as necessary.    PHYSICAL EXAMINATION  BP 116/64   Pulse 82   Ht 5\' 3"  (1.6 m)   Wt 153 lb 2 oz (69.5 kg)   SpO2 97%   BMI 27.12 kg/m  Wt Readings from Last 3 Encounters:  08/04/22 153 lb 2 oz (69.5 kg)  07/29/22 154 lb 6.4 oz (70 kg)  03/29/22 152 lb (68.9 kg)  GEN: NAD, appears stated age, doesn't appear chronically ill PSYCH: Cooperative, without pressured speech EYE: Conjunctivae pink, sclerae anicteric ENT: MMM CV: Non-tachycardic RESP: No audible wheezing GI: NABS, soft, NT/ND, without rebound MSK/EXT: No lower extremity edema SKIN: No jaundice NEURO:  Alert & Oriented x 3, no focal deficits   REVIEW OF DATA  I reviewed the following data at the time of this encounter:  GI Procedures and Studies  No new studies to review  Laboratory Studies  Reviewed those in epic and care everywhere  Imaging Studies  No new studies to  review   ASSESSMENT  Ms. Stroman is a 88 y.o. female with a pmh significant for CVA (on Plavix), atrial fibrillation, anxiety/depression, sleep apnea, status post appendectomy, seborrheic keratoses (seen dermatology in the past), chronic sore throat, mucosal melanoma, esophagitis, hiatal hernia, diverticulosis, hemorrhoids, colonic AVM, adenomatous colon polyps.  The patient is seen today for evaluation and management of:  1. Other dysphagia   2. Gastroesophageal reflux disease, unspecified whether esophagitis present   3. Abdominal pain, chronic, epigastric   4. Abdominal bloating   5. 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  The etiology of her current symptoms is not clearly defined.  With her findings of mucosal melanoma within the last year, is not  unreasonable to consider the role of an upper endoscopy to further evaluate her upper GI tract though I pretest probability of her having metastatic disease to her GI tract is lower this time.  Will perform upper endoscopy to evaluate her symptoms.  She will continue her current acid suppression therapy.  She will follow-up with her oncologist and should she end up needing a biopsy of her mucosa of her buccal cavity sooner than our endoscopy, then we will plan to potentially postpone endoscopy until after that has been completed so she will let us know if that is the case.  The risks and benefits of endoscopic evaluation were discussed with the patient; these include but are not limited to the risk of perforation, infection, bleeding, missed lesions, lack of diagnosis, severe illness requiring hospitalization, as well as anesthesia and sedation related illnesses.  The patient and/or family is agreeable to proceed.  All patient questions were answered to the best of my ability, and the patient agrees to the aforementioned plan of action with follow-up as indicated.   PLAN  Continue omeprazole 40 mg daily Proceed with updated endoscopy -Depending on patient's symptoms at time of endoscopy consider dilation - Will obtain Plavix hold from patient's referring provider Holding on manometry for now Patient will let us know if oncology feels like they need to do biopsies of the buccal mucosa which may postpone or change date of our endoscopy   Orders Placed This Encounter  Procedures   Other/Misc lab test   Ambulatory referral to Gastroenterology     New Prescriptions   No medications on file   Modified Medications   Modified Medication Previous Medication   OMEPRAZOLE (PRILOSEC) 40 MG CAPSULE omeprazole (PRILOSEC) 40 MG capsule      Take 1 capsule (40 mg total) by mouth as needed.    Take 40 mg by mouth as needed.    Planned Follow Up No follow-ups on file.   Total Time in Face-to-Face and  in Coordination of Care for patient including independent/personal interpretation/review of prior testing, medical history, examination, medication adjustment, communicating results with the patient directly, and documentation with the EHR is 25 minutes.    Corliss Parish, MD [facility] Advanced Endoscopy Office # [phone]

## 2022-08-04 NOTE — Patient Instructions (Addendum)
Your provider has ordered "Diatherix" stool testing for you. You have received a kit from our office today containing all necessary supplies to complete this test. Please carefully read the stool collection instructions provided in the kit before opening the accompanying materials. In addition, be sure to place the label from the top left corner of the laboratory request sheet onto the "puritan opti-swab" tube that is supplied in the kit. This label should include your full name and date of birth. After completing the test, you should secure the purtian tube into the specimen biohazard bag. The laboratory request information sheet (including date and time of specimen collection) should be placed into the outside pocket of the specimen biohazard bag and returned to the [facility] lab with 2 days of collection.   You have been scheduled for an endoscopy. Please follow written instructions given to you at your visit today.  If you use inhalers (even only as needed), please bring them with you on the day of your procedure.  If you take any of the following medications, they will need to be adjusted prior to your procedure:   DO NOT TAKE 7 DAYS PRIOR TO TEST- Trulicity (dulaglutide) Ozempic, Wegovy (semaglutide) Mounjaro (tirzepatide) Bydureon Bcise (exanatide extended release)  DO NOT TAKE 1 DAY PRIOR TO YOUR TEST Rybelsus (semaglutide) Adlyxin (lixisenatide) Victoza (liraglutide) Byetta (exanatide) ___________________________________________________________________________   Bonita Quin will be contaced by our office prior to your procedure for directions on holding your Plavix.  If you do not hear from our office 1 week prior to your scheduled procedure, please call [phone] to discuss.  We have sent the following medications to your pharmacy for you to pick up at your convenience: Omeprazole   Please contact office our office after you have been seen by Oncology to give [place] updates.   Due to  recent changes in healthcare laws, you may see the results of your imaging and laboratory studies on MyChart before your provider has had a chance to review them.  We understand that in some cases there may be results that are confusing or concerning to you. Not all laboratory results come back in the same time frame and the provider may be waiting for multiple results in order to interpret others.  Please give [place] 48 hours in order for your provider to thoroughly review all the results before contacting the office for clarification of your results.   Thank you for choosing me and [facility].  Dr. Meridee Score

## 2022-08-05 ENCOUNTER — Telehealth: Payer: Self-pay

## 2022-08-05 NOTE — Telephone Encounter (Signed)
   Darlene [name] 02/01/1933 [id number]  Dear Man [name] -NP:  We have scheduled the above named patient for a(n) Endoscopy procedure. Our records show that (s)he is on anticoagulation therapy.  Please advise as to whether the patient may come off their therapy of Plavix 5 days prior to their procedure which is scheduled for 09/21/22.  Please route your response to Lucky Rathke, CMA or fax response to [phone].  Sincerely,  Braylee Lal/ Dr.Mansouraty   [facility]

## 2022-08-07 ENCOUNTER — Encounter: Payer: Self-pay | Admitting: Gastroenterology

## 2022-08-07 DIAGNOSIS — R140 Abdominal distension (gaseous): Secondary | ICD-10-CM | POA: Insufficient documentation

## 2022-08-07 DIAGNOSIS — G8929 Other chronic pain: Secondary | ICD-10-CM | POA: Insufficient documentation

## 2022-08-07 DIAGNOSIS — C434 Malignant melanoma of scalp and neck: Secondary | ICD-10-CM | POA: Insufficient documentation

## 2022-08-10 ENCOUNTER — Encounter: Payer: Self-pay | Admitting: Gastroenterology

## 2022-08-10 NOTE — Progress Notes (Unsigned)
Diatherix stool testing H. pylori negative.  Will let the patient know her testing was negative and scanned this into the chart.

## 2022-08-12 ENCOUNTER — Telehealth: Payer: Self-pay

## 2022-08-13 ENCOUNTER — Telehealth: Payer: Self-pay

## 2022-08-13 MED ORDER — LEVOTHYROXINE SODIUM 100 MCG PO TABS: 100.0000 ug | ORAL_TABLET | Freq: Every day | ORAL | 3 refills | Status: DC

## 2022-08-13 NOTE — Addendum Note (Signed)
Addended by: Golden Gilreath X on: 08/13/2022 11:00 AM   Modules accepted: Orders

## 2022-08-13 NOTE — Telephone Encounter (Signed)
LVM for patient to call us if she has anymore questions or concerns

## 2022-08-17 ENCOUNTER — Telehealth: Payer: Self-pay | Admitting: Gastroenterology

## 2022-08-17 NOTE — Telephone Encounter (Signed)
Left message on machine to call back  

## 2022-08-17 NOTE — Telephone Encounter (Signed)
PT is calling to confirm appt. Advised that she has EGD on 9/3 and cannot understand as to why she doesn't see the Dr before having procedure done. She had OV in July to determine this procedure. She has all the proper prep instructions as well. She would also like to discuss results of stool test. Please advise.

## 2022-08-18 NOTE — Telephone Encounter (Signed)
Patient returning call. States she will be home for an jour. Please advise.   Thank you

## 2022-08-18 NOTE — Telephone Encounter (Signed)
Left message on machine to call back  

## 2022-08-19 NOTE — Telephone Encounter (Signed)
Left message on machine to call back  

## 2022-08-20 NOTE — Telephone Encounter (Signed)
Left message on machine to call back   Will await further communication from the pt unable to reach the pt by phone.

## 2022-08-25 ENCOUNTER — Telehealth: Payer: Self-pay | Admitting: Gastroenterology

## 2022-08-25 NOTE — Telephone Encounter (Signed)
Inbound call from patient stating that she has been diagnosed with throat and nose cancer and is wanting to discuss some information in her chart. Please advise.

## 2022-08-25 NOTE — Telephone Encounter (Signed)
Left message on machine to call back  

## 2022-08-25 NOTE — Telephone Encounter (Signed)
Patient stopped by office to drop off report she would like Dr. Meridee Score to review. She also was requesting to speak with nurse as she states she did not have a missed call. I sent a secure chat to the nurse to let her know the patient was in the lobby, patient left before the nurse saw my message. I told the patient before she left I would let the nurse know she stopped by.

## 2022-08-26 NOTE — Telephone Encounter (Signed)
Patient is returning your call.  

## 2022-08-26 NOTE — Telephone Encounter (Signed)
I have been unable to reach pt by phone.  I will send a MyChart message

## 2022-08-26 NOTE — Telephone Encounter (Signed)
I spoke with the pt and she tells met that she has dropped off some paperwork for Dr Meridee Score to review.  I did advise that we would call her if there is anything else we need.

## 2022-08-27 NOTE — Telephone Encounter (Signed)
I reviewed the notation in the chart from [facility]. No plans for biopsy that I can see at this time. I called and spoke with the patient as well and she confirms there is no plan for biopsy yet. She does have an oncology visit and infusion scheduled for next week and she will update [place] if something changes after her discussion with oncology.  If the patient ends up needing a buccal or mandibular or oral biopsy to be performed I would like to wait at least 1 month after a biopsy is performed before doing an endoscopy. But since the patient has nothing scheduled currently we will keep her current date scheduled in September. The patient agrees with this plan and she will update [place] if something changes.  No further calls are necessary at this time.   Corliss Parish, MD [facility] Advanced Endoscopy Office # [phone]

## 2022-08-30 NOTE — Telephone Encounter (Signed)
Clearance has been faxed x2. I called and left detail message on the voicemail at the office.

## 2022-09-02 ENCOUNTER — Telehealth: Payer: Self-pay

## 2022-09-02 ENCOUNTER — Other Ambulatory Visit: Payer: Self-pay

## 2022-09-02 ENCOUNTER — Telehealth: Payer: Self-pay | Admitting: Gastroenterology

## 2022-09-02 MED ORDER — KETOCONAZOLE 2 % EX SHAM: MEDICATED_SHAMPOO | CUTANEOUS | 1 refills | Status: DC

## 2022-09-02 NOTE — Telephone Encounter (Signed)
Left message for patient asking for return call. Not clear on what patient needs from message that was sent.

## 2022-09-02 NOTE — Telephone Encounter (Signed)
Patient called requesting refill on nizoral shampoo. Patient states that she does currently have a dermatologist, but she doesn't want to deal with them.  Message sent to [name] [name], NP

## 2022-09-02 NOTE — Telephone Encounter (Signed)
Ro looks like this is something you were working on in the office.

## 2022-09-02 NOTE — Telephone Encounter (Signed)
Medication sent to pharmacy  

## 2022-09-02 NOTE — Telephone Encounter (Signed)
Inbound call from patient stating she is able to have endoscopy scheduled for 9/3. Patient states she is not having biopsy. Patient requesting a call back also to discuss prep and concerns she has. Please advise, thank you.

## 2022-09-02 NOTE — Telephone Encounter (Signed)
[name], [name], NP  You13 minutes ago (12:42 PM)    Ok, can you send refills for her?  Thank you

## 2022-09-03 NOTE — Telephone Encounter (Signed)
Patient stopped by office this afternoon because she could not get through on our phone lines.   Patient wanted to let us know that she was informed by her dentist that she does not need further biopsies of her mouth and could proceed with her scheduled EGD on 09/20/22. I printed out patients instructions for EGD and went over them again. Patient voiced understanding of when she needs to start and stop clear liquids and medications that she needs to hold.

## 2022-09-07 ENCOUNTER — Other Ambulatory Visit: Payer: Self-pay | Admitting: Nurse Practitioner

## 2022-09-07 NOTE — Telephone Encounter (Signed)
High risk or very high risk warning populated when attempting to refill medication. RX request sent to PCP for review and approval if warranted.

## 2022-09-09 ENCOUNTER — Encounter: Payer: Self-pay | Admitting: Gastroenterology

## 2022-09-09 ENCOUNTER — Encounter: Payer: Self-pay | Admitting: Nurse Practitioner

## 2022-09-09 ENCOUNTER — Non-Acute Institutional Stay: Payer: Medicare HMO | Admitting: Nurse Practitioner

## 2022-09-09 VITALS — BP 122/78 | HR 88 | Temp 97.6°F | Resp 18 | Ht 63.0 in | Wt 156.8 lb

## 2022-09-09 DIAGNOSIS — K219 Gastro-esophageal reflux disease without esophagitis: Secondary | ICD-10-CM | POA: Diagnosis not present

## 2022-09-09 DIAGNOSIS — R1319 Other dysphagia: Secondary | ICD-10-CM

## 2022-09-09 DIAGNOSIS — F419 Anxiety disorder, unspecified: Secondary | ICD-10-CM

## 2022-09-09 DIAGNOSIS — E039 Hypothyroidism, unspecified: Secondary | ICD-10-CM

## 2022-09-09 DIAGNOSIS — R32 Unspecified urinary incontinence: Secondary | ICD-10-CM

## 2022-09-09 DIAGNOSIS — I10 Essential (primary) hypertension: Secondary | ICD-10-CM

## 2022-09-09 DIAGNOSIS — C300 Malignant neoplasm of nasal cavity: Secondary | ICD-10-CM

## 2022-09-09 NOTE — Assessment & Plan Note (Signed)
blood pressure is controlled on Losartan qd. added Imdur 30mg  tid by Cardiology. Followed by Cardiology. CTA negative 09/01/20. Bun/creat 17/0.69 07/21/22

## 2022-09-09 NOTE — Assessment & Plan Note (Signed)
Incidental finding of melanoma in nose, follows oncology

## 2022-09-09 NOTE — Assessment & Plan Note (Signed)
Ambien, Valium 

## 2022-09-09 NOTE — Assessment & Plan Note (Signed)
stable Omeprazole, FDgard helped,  saw GI, EGD 12/26/19. Vit B12 383 12/02/20, Hgb 13.7 07/21/22. GI 09/03/21, pending repeat endoscope.

## 2022-09-09 NOTE — Assessment & Plan Note (Addendum)
Urinary frequency/leakage, 2-3x/night, stopped taking Gemtesa, acupuncture improved initially. Saw urology. Failed tx in the past, doesn't pursue further workup and tx.

## 2022-09-09 NOTE — Assessment & Plan Note (Signed)
Swallow studay 05/12/20, worked with [place], GI f/u

## 2022-09-09 NOTE — Assessment & Plan Note (Addendum)
on Levothyroxine-declined dose adjustment.  TSH 0.168 02/18/22, 0.17 08/03/22, will f/u at oncology.

## 2022-09-09 NOTE — Progress Notes (Signed)
Location:   Clinic [facility]   Place of Service:  Clinic (12) Provider: Chipper Oman NP  Code Status: DNR Goals of Care:     09/09/2022   11:25 AM  Advanced Directives  Does Patient Have a Medical Advance Directive? Yes  Type of Advance Directive Healthcare Power of Attorney  Does patient want to make changes to medical advance directive? No - Patient declined  Copy of Healthcare Power of Attorney in Chart? Yes - validated most recent copy scanned in chart (See row information)  Would patient like information on creating a medical advance directive? No - Patient declined     Chief Complaint  Patient presents with   Follow-up    Follow-up    Quality Metric Gaps    Needs to discuss Colonoscopy, Medicare Annual Wellness Visit, DTAP, Covid, and Influenza vaccine.     HPI: Patient is a 89 y.o. female seen today for medical management of chronic diseases.     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               Sleeps too much during day, failed dc Ambien, Valium, refused CPAP, feels a veil in front of left eye, more noticeable left facial weakness. CT head was not done since had CT sinuses/[facility] at [facility].  Hx of chronic rhinitis/sphenoidal sinusitis, s/p surgery, improved SOB, wheezing,  on Astelin, Flonase, Bronchodilator, underwent Pulmonology, ENT, GI, Allergy evaluation, had spirometry 10/20/20, Echo 60-65% EF 04/16/20, CTA 09/01/20. Saw ENT Incidental finding of melanoma in nose, follows oncology             Chronic sore throat: multiple factorials, GERD vs chronic rhinitis/sinusitis,  ENT  Insomnia Ambien, Valium             Hx of CVA, on plavix. Statin,  residual of  poor left peripheral vision  Hypothyroidism, on Levothyroxine-declined dose adjustment.  TSH 0.168 02/18/22, 0.17 08/03/22              HTN, blood pressure is controlled on Losartan qd. added Imdur 30mg  tid by Cardiology. Followed by Cardiology. CTA negative 09/01/20. Bun/creat 17/0.69 07/21/22            GERD, stable Omeprazole, FDgard helped,  saw GI, EGD 12/26/19. Vit B12 383 12/02/20, Hgb 13.7 07/21/22. GI 09/03/21, pending repeat endoscope.              Lactose intolerance, diet adjustment, f/u GI             Esophageal dysmotility, Swallow studay 05/12/20, worked with ST, GI f/u             OSA sleep study, saw Pulmonology.  Not using CPAP                                     OA, saw Ortho. Cervical spinal stenosis, MR 02/26/20, C6-C7             Hyperlipidemia, takes Pravastatin, LDL 94 05/12/21             Urinary frequency/leakage, 2-3x/night, stopped taking Gemtesa, acupuncture improved initially. Saw urology.  Osteopenia, DEXA 12/25/20, t score -1.0,  10 years possibility major fx 11%, hip fx 2.6%. Vit D, Cal-diet rich,  Vit D 3 39 12/26/20             HOH audiology eval, ENT placed tube in the right ear-failed-again, hearing aids    Past Medical History:  Diagnosis Date   Anxiety    Atypical mole 11/11/2020   Left Inner Knee (moderate-free)   Colon polyps    Depression    Hyperlipidemia    Hypertension    Hypothyroidism    Melanoma in situ (HCC) 09/19/2018   Right Arm (excision)   Skin cancer    Sleep apnea    Stroke [facility]) 2008    Past Surgical History:  Procedure Laterality Date   ADENOIDECTOMY     APPENDECTOMY  1960   CATARACT EXTRACTION Bilateral    COLONOSCOPY     GUM SURGERY  03/2019   LUMBAR DISC SURGERY  2014   MELANOMA EXCISION  2020   MESH APPLIED TO LAP PORT     SINUS EXPLORATION     11/17/2021 operation for right nasal cavity and 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   TONSILLECTOMY  1940    Allergies  Allergen Reactions   Lactose Intolerance (Gi) Other (See Comments)    indigestion   Norco [Hydrocodone-Acetaminophen] Nausea And Vomiting   Dust  Mite Extract Other (See Comments)    Sinus drainage   Oxycontin [Oxycodone Hcl] Nausea And Vomiting   Pollen Extract Other (See Comments)    Sinus drainage    Allergies as of 09/09/2022       Reactions   Lactose Intolerance (gi) Other (See Comments)   indigestion   Norco [hydrocodone-acetaminophen] Nausea And Vomiting   Dust Mite Extract Other (See Comments)   Sinus drainage   Oxycontin [oxycodone Hcl] Nausea And Vomiting   Pollen Extract Other (See Comments)   Sinus drainage        Medication List        Accurate as of September 09, 2022  4:17 PM. If you have any questions, ask your nurse or doctor.          acetaminophen 325 MG tablet Commonly known as: TYLENOL Take 650 mg by mouth every 6 (six) hours as needed for headache (pain).   antiseptic oral rinse Liqd 15 mLs by Mouth Rinse route as needed for dry mouth or mouth pain.   calcium carbonate 500 MG chewable tablet Commonly known as: TUMS - dosed in mg elemental calcium Chew 1 tablet by mouth as needed for indigestion or heartburn.   clopidogrel 75 MG tablet Commonly known as: PLAVIX TAKE 1 TABLET BY MOUTH EVERY DAY   cycloSPORINE 0.05 % ophthalmic emulsion Commonly known as: RESTASIS Place 1 drop into both eyes as needed.   ketoconazole 2 % shampoo Commonly known as: NIZORAL Apply topically 2 (two) times a week.   levothyroxine 100 MCG tablet Commonly known as: SYNTHROID Take 1 tablet (100 mcg total) by mouth daily.   losartan 50 MG tablet Commonly known as: COZAAR Take 1 tablet (50 mg total) by mouth daily.   nitroGLYCERIN 0.4 MG SL tablet Commonly known as: NITROSTAT Place 1 tablet (0.4 mg total) under the tongue every 5 (five) minutes as needed for up to 25 days for chest pain.   omeprazole 40 MG capsule Commonly known as: PRILOSEC Take 1 capsule (40 mg total) by mouth as needed.   pravastatin 40 MG tablet Commonly  known as: PRAVACHOL Take 1 tablet (40 mg total) by mouth daily.   Systane  0.4-0.3 % Gel ophthalmic gel Generic drug: Polyethyl Glycol-Propyl Glycol Place 1 application. into both eyes at bedtime.   Zolpidem Tartrate 3.5 MG Subl TAKE 1 TABLET BY MOUTH EVERY DAY AT BEDTIME AS NEEDED        Review of Systems:  Review of Systems  Constitutional:  Positive for fatigue. Negative for fever.  HENT:  Positive for congestion and hearing loss. Negative for nosebleeds, sinus pressure, sore throat and voice change.        Chronic sinusitis, underwent ENT, sleep study-sleep apnea. Gum irritation  Eyes:  Positive for visual disturbance.       Left lateral visual field deficit. Dry eyes  Respiratory:  Negative for cough and shortness of breath.   Cardiovascular:  Negative for leg swelling.  Gastrointestinal:  Negative for abdominal pain and constipation.  Genitourinary:  Positive for frequency. Negative for dysuria and urgency.       Urinary leakage, urinary frequency 2-3x/night. Doing Kegel exercise.   Musculoskeletal:  Negative for back pain and gait problem.  Skin:  Negative for color change.       Mohs dorsum right hand, healed.   Neurological:  Negative for speech difficulty, weakness and headaches.  Psychiatric/Behavioral:  Positive for sleep disturbance. Negative for dysphoric mood. The patient is not nervous/anxious.        Early am awake, difficulty returning asleep.     Health Maintenance  Topic Date Due   COVID-19 Vaccine (4 - 2023-24 season) 09/18/2021   Colonoscopy  10/25/2021   Medicare Annual Wellness (AWV)  03/05/2022   INFLUENZA VACCINE  08/19/2022   Pneumonia Vaccine 14+ Years old  Completed   DEXA SCAN  Completed   Zoster Vaccines- Shingrix  Completed   HPV VACCINES  Aged Out   DTaP/Tdap/Td  Discontinued    Physical Exam: Vitals:   09/09/22 1125 09/09/22 1539  BP:  122/78  Pulse:  88  Resp:  18  Temp:  97.6 F (36.4 C)  SpO2:  98%  Weight:  156 lb 12.8 oz (71.1 kg)  Height: 5\' 3"  (1.6 m) 5\' 3"  (1.6 m)   Body mass index is 27.78  kg/m. Physical Exam Vitals and nursing note reviewed.  Constitutional:      Appearance: Normal appearance.  HENT:     Head: Normocephalic and atraumatic.     Ears:     Comments: Right ear tube placed, mild erythema, no bleeding.     Nose: Nose normal.     Comments: Redness right nostril, no bleed.     Mouth/Throat:     Mouth: Mucous membranes are moist.     Pharynx: No oropharyngeal exudate or posterior oropharyngeal erythema.  Eyes:     Extraocular Movements: Extraocular movements intact.     Conjunctiva/sclera: Conjunctivae normal.     Pupils: Pupils are equal, round, and reactive to light.     Comments: Left peripheral visual field deficit since CVA 2006  Cardiovascular:     Rate and Rhythm: Normal rate and regular rhythm.     Heart sounds: No murmur heard. Pulmonary:     Effort: Pulmonary effort is normal.     Breath sounds: Rales present.     Comments: Bibasilar rales.  Abdominal:     General: Bowel sounds are normal.     Palpations: Abdomen is soft.     Tenderness: There is no abdominal tenderness.  Musculoskeletal:  Cervical back: Normal range of motion and neck supple.     Right lower leg: No edema.     Left lower leg: No edema.  Skin:    General: Skin is warm and dry.  Neurological:     General: No focal deficit present.     Mental Status: She is alert and oriented to person, place, and time. Mental status is at baseline.     Motor: Weakness present.     Gait: Gait normal.     Comments: Left facial weakness  Psychiatric:        Mood and Affect: Mood normal.        Behavior: Behavior normal.        Thought Content: Thought content normal.        Judgment: Judgment normal.     Labs reviewed: Basic Metabolic Panel: Recent Labs    02/18/22 1148 02/18/22 1235 06/15/22 1706 07/21/22 0522 08/03/22 0729  NA 141  --  141 140  --   K 3.9  --  3.9 3.7  --   CL 106  --  108 107  --   CO2 27  --  26 23  --   GLUCOSE 97  --  102* 117*  --   BUN 14  --   19 17  --   CREATININE 0.74  --  0.81 0.69  --   CALCIUM 9.4  --  9.1 9.3  --   TSH  --  0.168*  --   --  0.17*   Liver Function Tests: Recent Labs    02/18/22 1148 06/15/22 1706 07/21/22 0522  AST 17 18 18   ALT 15 17 16   ALKPHOS 69 58 67  BILITOT 0.9 0.5 0.7  PROT 7.4 6.7 6.7  ALBUMIN 4.1 3.7 3.9   Recent Labs    06/15/22 1706  LIPASE 48   No results for input(s): "AMMONIA" in the last 8760 hours. CBC: Recent Labs    02/18/22 1148 06/15/22 1706 07/21/22 0522  WBC 6.5 7.0 6.3  NEUTROABS 3.9 4.2 3.5  HGB 14.3 13.6 13.7  HCT 43.6 40.6 41.4  MCV 90.5 90.0 90.4  PLT 263 243 243   Lipid Panel: No results for input(s): "CHOL", "HDL", "LDLCALC", "TRIG", "CHOLHDL", "LDLDIRECT" in the last 8760 hours. Lab Results  Component Value Date   HGBA1C 6.1 (H) 06/07/2021    Procedures since last visit: No results found.  Assessment/Plan  Acquired hypothyroidism  on Levothyroxine-declined dose adjustment.  TSH 0.168 02/18/22, 0.17 08/03/22, will f/u at oncology.   Essential hypertension  blood pressure is controlled on Losartan qd. added Imdur 30mg  tid by Cardiology. Followed by Cardiology. CTA negative 09/01/20. Bun/creat 17/0.69 07/21/22  Gastroesophageal reflux disease  stable Omeprazole, FDgard helped,  saw GI, EGD 12/26/19. Vit B12 383 12/02/20, Hgb 13.7 07/21/22. GI 09/03/21, pending repeat endoscope.   Dysphagia Swallow studay 05/12/20, worked with ST, GI f/u  Incontinent of urine   Urinary frequency/leakage, 2-3x/night, stopped taking Gemtesa, acupuncture improved initially. Saw urology. Failed tx in the past, doesn't pursue further workup and tx.   Anxiety Ambien, Valium  Malignant melanoma of nasal cavity (HCC) Incidental finding of melanoma in nose, follows oncology   Labs/tests ordered:  none  Next appt:  09/16/2022, AWV 09/30/22

## 2022-09-10 NOTE — Progress Notes (Signed)
Patient can be taken up for endoscopy procedure with low risk, hold Plavix for 5 days, restart same day if no biopsy otherwise in 3 to 5 days postprocedure.  Call if questions.   Yates Decamp, MD, [facility] 09/10/2022, 8:43 PM Office: [phone] Fax: [phone] Pager: [phone]

## 2022-09-10 NOTE — Telephone Encounter (Signed)
Request has been sent to Dr. Sherilyn Banker asking for clearance to hold Plavix.

## 2022-09-12 ENCOUNTER — Telehealth: Payer: Self-pay | Admitting: *Deleted

## 2022-09-12 NOTE — Telephone Encounter (Signed)
Dr. Meridee Score,  This pt is scheduled for a procedure  on 9/3.   She is a documented difficult intubation and her procedure will need to be done at the hospital.   Thanks,  Cathlyn Parsons

## 2022-09-12 NOTE — Telephone Encounter (Signed)
Yesi,   This pt is a documented difficult intubation and her procedure will need to be done at the hospital.   Thanks,  John Nulty 

## 2022-09-13 ENCOUNTER — Other Ambulatory Visit: Payer: Self-pay

## 2022-09-13 DIAGNOSIS — G8929 Other chronic pain: Secondary | ICD-10-CM

## 2022-09-13 DIAGNOSIS — R1319 Other dysphagia: Secondary | ICD-10-CM

## 2022-09-13 NOTE — Telephone Encounter (Signed)
Yates Decamp, MD filed at 09/10/2022  8:43 PM    Patient can be taken up for endoscopy procedure with low risk, hold Plavix for 5 days, restart same day if no biopsy otherwise in 3 to 5 days postprocedure.  Call if questions.    Yates Decamp, MD, [facility] 09/10/2022, 8:43 PM Office: [phone] Fax: [phone] Pager: [phone]      Patient has been informed per Dr Jacinto Halim okay to hold Plavix x5 days prior to procedure. Restart same day if no biopsy. Patient voiced understanding.

## 2022-09-13 NOTE — Telephone Encounter (Signed)
EGD has been set up for 11/11/22 at 945 am at [facility] with GM   Left message on machine to call back

## 2022-09-13 NOTE — Telephone Encounter (Signed)
See TE encounter from Jonny Ruiz and Dr. Meridee Score for further details. RN to set up EGD at hospital per MD

## 2022-09-14 NOTE — Telephone Encounter (Signed)
The pt has a trip on 10/24 and requested to be moved to 10/28 at 8 am at [facility] with GM  She has been rescheduled and re instructed- all questions answered

## 2022-09-16 ENCOUNTER — Other Ambulatory Visit: Payer: Medicare HMO

## 2022-09-16 ENCOUNTER — Other Ambulatory Visit: Payer: Self-pay

## 2022-09-16 ENCOUNTER — Encounter (HOSPITAL_COMMUNITY): Payer: Self-pay

## 2022-09-16 ENCOUNTER — Emergency Department (HOSPITAL_COMMUNITY)
Admission: EM | Admit: 2022-09-16 | Discharge: 2022-09-17 | Discharge status: Against Medical Advice | Payer: Medicare HMO | Attending: Emergency Medicine | Admitting: Emergency Medicine

## 2022-09-16 DIAGNOSIS — R110 Nausea: Secondary | ICD-10-CM | POA: Diagnosis not present

## 2022-09-16 DIAGNOSIS — Z5321 Procedure and treatment not carried out due to patient leaving prior to being seen by health care provider: Secondary | ICD-10-CM | POA: Diagnosis not present

## 2022-09-16 DIAGNOSIS — R5381 Other malaise: Secondary | ICD-10-CM | POA: Diagnosis not present

## 2022-09-16 DIAGNOSIS — R519 Headache, unspecified: Secondary | ICD-10-CM | POA: Diagnosis present

## 2022-09-16 LAB — BASIC METABOLIC PANEL
Anion gap: 7 (ref 5–15)
BUN: 14 mg/dL (ref 8–23)
CO2: 26 mmol/L (ref 22–32)
Calcium: 9.3 mg/dL (ref 8.9–10.3)
Chloride: 106 mmol/L (ref 98–111)
Creatinine, Ser: 0.65 mg/dL (ref 0.44–1.00)
GFR, Estimated: >60 mL/min (ref >60)
Glucose, Bld: 119 mg/dL — ABNORMAL HIGH (ref 70–99)
Potassium: 4 mmol/L (ref 3.5–5.1)
Sodium: 139 mmol/L (ref 135–145)

## 2022-09-16 LAB — CBC
HCT: 43.9 % (ref 36.0–46.0)
Hemoglobin: 14.3 g/dL (ref 12.0–15.0)
MCH: 29.7 pg (ref 26.0–34.0)
MCHC: 32.6 g/dL (ref 30.0–36.0)
MCV: 91.1 fL (ref 80.0–100.0)
Platelets: 247 10*3/uL (ref 150–400)
RBC: 4.82 MIL/uL (ref 3.87–5.11)
RDW: 12.3 % (ref 11.5–15.5)
WBC: 9.7 10*3/uL (ref 4.0–10.5)
nRBC: 0 % (ref 0.0–0.2)

## 2022-09-16 NOTE — ED Triage Notes (Signed)
Patient BIB EMS from [facility] for headache. Patient reports headache, nausea and general malaise since getting her shingles shot yesterday. Patient denies fevers, vomiting, diarrhea.  Patient is on immunotherapy for melanoma.

## 2022-09-21 ENCOUNTER — Telehealth: Payer: Self-pay

## 2022-09-21 ENCOUNTER — Encounter: Payer: Medicare HMO | Admitting: Gastroenterology

## 2022-09-21 NOTE — Transitions of Care (Post Inpatient/ED Visit) (Signed)
   09/21/2022  Name: Nakaila Coley MRN: [id number] DOB: Jan 08, 1934  Today's TOC FU Call Status: Today's TOC FU Call Status:: Unsuccessful Call (1st Attempt) Unsuccessful Call (1st Attempt) Date: 09/21/22  Attempted to reach the patient regarding the most recent Inpatient/ED visit. Called patient and no answer. Voicemail was left with office call back number.    Follow Up Plan: Additional outreach attempts will be made to reach the patient to complete the Transitions of Care (Post Inpatient/ED visit) call.   Signature: Keyira Mondesir.D/RMA

## 2022-09-21 NOTE — Transitions of Care (Post Inpatient/ED Visit) (Signed)
09/21/2022  Name: Darlene [name] MRN: [id number] DOB: 10-20-1933  Today's TOC FU Call Status: Today's TOC FU Call Status:: Successful TOC FU Call Completed Unsuccessful Call (1st Attempt) Date: 09/21/22 [facility] FU Call Complete Date: 09/21/22 Patient's Name and Date of Birth confirmed.  Transition Care Management Follow-up Telephone Call Date of Discharge: 09/17/22 Discharge Facility: [facility]) Type of Discharge: Emergency Department Reason for ED Visit: Other: (Headache) How have you been since you were released from the hospital?: Better Any questions or concerns?: Yes Patient Questions/Concerns:: Patient will ask questions during appointment  Items Reviewed: Did you receive and understand the discharge instructions provided?: No Medications obtained,verified, and reconciled?: Yes (Medications Reviewed) Any new allergies since your discharge?: No Dietary orders reviewed?: No Do you have support at home?: Yes People in Home: child(ren), adult Name of Support/Comfort Primary Source: Ted  Medications Reviewed Today: Medications Reviewed Today     Reviewed by Dicky Doe, CMA (Certified Medical Assistant) on 09/21/22 at [phone]  Med List Status: <None>   Medication Order Taking? Sig Documenting Provider Last Dose Status Informant  acetaminophen (TYLENOL) 325 MG tablet [id number] Yes Take 650 mg by mouth every 6 (six) hours as needed for headache (pain). [provider] Taking Active Self  antiseptic oral rinse (BIOTENE) LIQD [id number] Yes 15 mLs by Mouth Rinse route as needed for dry mouth or mouth pain. [provider] Taking Active   calcium carbonate (TUMS - DOSED IN MG ELEMENTAL CALCIUM) 500 MG chewable tablet [id number] Yes Chew 1 tablet by mouth as needed for indigestion or heartburn. [provider] Taking Active   clopidogrel (PLAVIX) 75 MG tablet [id number] Yes TAKE 1 TABLET BY MOUTH EVERY DAY [name], [name], NP Taking Active   cycloSPORINE  (RESTASIS) 0.05 % ophthalmic emulsion [id number] Yes Place 1 drop into both eyes as needed. [provider] Taking Active   ketoconazole (NIZORAL) 2 % shampoo [id number] Yes Apply topically 2 (two) times a week. [name], [name], NP Taking Active   levothyroxine (SYNTHROID) 100 MCG tablet [id number] Yes Take 1 tablet (100 mcg total) by mouth daily. [name], [name], NP Taking Active   losartan (COZAAR) 50 MG tablet [id number] Yes Take 1 tablet (50 mg total) by mouth daily. Nori Riis, NP Taking Active   nitroGLYCERIN (NITROSTAT) 0.4 MG SL tablet [id number] Yes Place 1 tablet (0.4 mg total) under the tongue every 5 (five) minutes as needed for up to 25 days for chest pain. Nori Riis, NP Taking Active   omeprazole (PRILOSEC) 40 MG capsule [id number] Yes Take 1 capsule (40 mg total) by mouth as needed. Mansouraty, Netty Starring., MD Taking Active   Polyethyl Glycol-Propyl Glycol (SYSTANE) 0.4-0.3 % GEL ophthalmic gel [id number] Yes Place 1 application. into both eyes at bedtime. [provider] Taking Active Self  pravastatin (PRAVACHOL) 40 MG tablet [id number] Yes Take 1 tablet (40 mg total) by mouth daily. Yates Decamp, MD Taking Active   Zolpidem Tartrate 3.5 MG SUBL [id number] Yes TAKE 1 TABLET BY MOUTH EVERY DAY AT BEDTIME AS NEEDED [name], [name], NP Taking Active   Med List Note Gorden Harms, CPhT 06/06/21 1308): Resident of [facility]            Home Care and Equipment/Supplies: Were Home Health Services Ordered?: No Any new equipment or medical supplies ordered?: No  Functional Questionnaire: Do you need assistance with bathing/showering or dressing?: No Do you need assistance with meal preparation?: No Do you need assistance  with eating?: No Do you have difficulty maintaining continence: No Do you need assistance with getting out of bed/getting out of a chair/moving?: No Do you have difficulty managing or taking your medications?:  No  Follow up appointments reviewed: PCP Follow-up appointment confirmed?: Yes Date of PCP follow-up appointment?: 09/23/22 Follow-up Provider: [name], Man, NP Specialist Hospital Follow-up appointment confirmed?: No Do you need transportation to your follow-up appointment?: No Do you understand care options if your condition(s) worsen?: Yes-patient verbalized understanding  Patient called office back and TOC was completed.   SIGNATURE: Elienai Gailey.D/RMA

## 2022-09-23 ENCOUNTER — Encounter: Payer: Self-pay | Admitting: Nurse Practitioner

## 2022-09-23 ENCOUNTER — Encounter: Payer: Medicare HMO | Admitting: Nurse Practitioner

## 2022-09-24 NOTE — Progress Notes (Signed)
This encounter was created in error - please disregard.

## 2022-09-30 ENCOUNTER — Encounter: Payer: Medicare HMO | Admitting: Nurse Practitioner

## 2022-09-30 ENCOUNTER — Encounter: Payer: Self-pay | Admitting: Nurse Practitioner

## 2022-09-30 ENCOUNTER — Non-Acute Institutional Stay: Payer: Medicare HMO | Admitting: Nurse Practitioner

## 2022-09-30 VITALS — BP 122/78 | HR 77 | Temp 97.7°F | Resp 18 | Ht 63.0 in | Wt 158.4 lb

## 2022-09-30 DIAGNOSIS — R32 Unspecified urinary incontinence: Secondary | ICD-10-CM

## 2022-09-30 DIAGNOSIS — J329 Chronic sinusitis, unspecified: Secondary | ICD-10-CM

## 2022-09-30 DIAGNOSIS — I10 Essential (primary) hypertension: Secondary | ICD-10-CM

## 2022-09-30 DIAGNOSIS — Z8673 Personal history of transient ischemic attack (TIA), and cerebral infarction without residual deficits: Secondary | ICD-10-CM

## 2022-09-30 DIAGNOSIS — F419 Anxiety disorder, unspecified: Secondary | ICD-10-CM

## 2022-09-30 DIAGNOSIS — E039 Hypothyroidism, unspecified: Secondary | ICD-10-CM

## 2022-09-30 DIAGNOSIS — K219 Gastro-esophageal reflux disease without esophagitis: Secondary | ICD-10-CM

## 2022-09-30 DIAGNOSIS — F5101 Primary insomnia: Secondary | ICD-10-CM

## 2022-09-30 NOTE — Assessment & Plan Note (Signed)
Urinary frequency/leakage, 2-3x/night, stopped taking Gemtesa, acupuncture improved initially. Saw urology.

## 2022-09-30 NOTE — Assessment & Plan Note (Signed)
on Levothyroxine-declined dose adjustment.  TSH 0.168 02/18/22, 0.17 08/03/22

## 2022-09-30 NOTE — Assessment & Plan Note (Signed)
blood pressure is controlled on Losartan qd. added Imdur 30mg  tid by Cardiology. Followed by Cardiology. CTA negative 09/01/20. Bun/creat 17/0.69 07/21/22

## 2022-09-30 NOTE — Assessment & Plan Note (Signed)
Sleeps too much during day, failed dc Ambien, off Valium, refused CPAP, feels a veil in front of left eye, more noticeable left facial weakness

## 2022-09-30 NOTE — Assessment & Plan Note (Signed)
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

## 2022-09-30 NOTE — Telephone Encounter (Signed)
This encounter was created in error - please disregard.

## 2022-09-30 NOTE — Assessment & Plan Note (Signed)
Hx of CVA, on plavix. Statin,  residual of  poor left peripheral vision  

## 2022-09-30 NOTE — Assessment & Plan Note (Signed)
Hx of chronic rhinitis/sphenoidal sinusitis, s/p surgery, improved SOB, wheezing, but still has nasal/right ear congestion, on Astelin, Flonase, Bronchodilator, underwent Pulmonology, ENT, GI, Allergy evaluation, had spirometry 10/20/20, Echo 60-65% EF 04/16/20, CTA 09/01/20. Saw ENT

## 2022-09-30 NOTE — Progress Notes (Signed)
Location:   Clinic [facility]   Place of Service:  Clinic (12) Provider: Chipper Oman NP  Code Status: DNR Goals of Care:     09/30/2022    9:14 AM  Advanced Directives  Does Patient Have a Medical Advance Directive? Yes  Type of Advance Directive Healthcare Power of Attorney  Does patient want to make changes to medical advance directive? No - Patient declined  Copy of Healthcare Power of Attorney in Chart? Yes - validated most recent copy scanned in chart (See row information)  Would patient like information on creating a medical advance directive? No - Patient declined     Chief Complaint  Patient presents with   Transitions Of Care    Hospital Follow-up    HPI: Patient is a 89 y.o. female seen today for medical management of chronic diseases.    After 2nd Shingrix, feels aches, headache, more nasal/ear congestion.  07/21/22 ED eval, dehydration, gum swelling             Malignant melanoma of nasal cavity, followed by oncology, ENT, dental team, on immunotherapy             Anxiety, chronic, escalated since  recurrent melanoma in nose, Valium available to her.               Sleeps too much during day, failed dc Ambien, off Valium, refused CPAP, feels a veil in front of left eye, more noticeable left facial weakness Hx of chronic rhinitis/sphenoidal sinusitis, s/p surgery, improved SOB, wheezing, but still has nasal/right ear congestion, on Astelin, Flonase, Bronchodilator, underwent Pulmonology, ENT, GI, Allergy evaluation, had spirometry 10/20/20, Echo 60-65% EF 04/16/20, CTA 09/01/20. Saw ENT Incidental finding of melanoma in nose, follows oncology             Chronic sore throat: multiple factorials, GERD vs chronic rhinitis/sinusitis,  ENT  Insomnia Ambien             Hx of CVA, on plavix. Statin,  residual of  poor left peripheral vision  Hypothyroidism, on Levothyroxine-declined dose adjustment.  TSH 0.168 02/18/22, 0.17 08/03/22             HTN, blood pressure is controlled on  Losartan qd. added Imdur 30mg  tid by Cardiology. Followed by Cardiology. CTA negative 09/01/20. Bun/creat 17/0.69 07/21/22            GERD, stable Omeprazole, FDgard helped,  saw GI, EGD 12/26/19. Vit B12 383 12/02/20, Hgb 13.7 07/21/22. GI 09/03/21, pending repeat endoscope.              Lactose intolerance, diet adjustment, f/u GI             Esophageal dysmotility, Swallow studay 05/12/20, worked with ST, GI f/u             OSA sleep study, saw Pulmonology.  Not using CPAP                                     OA, saw Ortho. Cervical spinal stenosis, MR 02/26/20, C6-C7             Hyperlipidemia, takes Pravastatin, LDL 94 05/12/21             Urinary frequency/leakage, 2-3x/night, stopped taking Gemtesa, acupuncture improved initially. Saw urology.              Osteopenia, DEXA 12/25/20, t score -1.0,  10  years possibility major fx 11%, hip fx 2.6%. Vit D, Cal-diet rich,  Vit D 3 39 12/26/20             HOH audiology eval, ENT placed tube in the right ear-failed-again, hearing aids     Past Medical History:  Diagnosis Date   Anxiety    Atypical mole 11/11/2020   Left Inner Knee (moderate-free)   Colon polyps    Depression    Hyperlipidemia    Hypertension    Hypothyroidism    Melanoma in situ (HCC) 09/19/2018   Right Arm (excision)   Skin cancer    Sleep apnea    Stroke [facility]) 2008    Past Surgical History:  Procedure Laterality Date   ADENOIDECTOMY     APPENDECTOMY  1960   CATARACT EXTRACTION Bilateral    COLONOSCOPY     GUM SURGERY  03/2019   LUMBAR DISC SURGERY  2014   MELANOMA EXCISION  2020   MESH APPLIED TO LAP PORT     SINUS EXPLORATION     11/17/2021 operation for right nasal cavity and 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   TONSILLECTOMY  1940    Allergies  Allergen Reactions   Lactose Intolerance (Gi) Other (See Comments)    indigestion   Norco [Hydrocodone-Acetaminophen] Nausea And Vomiting   Dust Mite Extract Other (See Comments)    Sinus  drainage   Oxycontin [Oxycodone Hcl] Nausea And Vomiting   Pollen Extract Other (See Comments)    Sinus drainage    Allergies as of 09/30/2022       Reactions   Lactose Intolerance (gi) Other (See Comments)   indigestion   Norco [hydrocodone-acetaminophen] Nausea And Vomiting   Dust Mite Extract Other (See Comments)   Sinus drainage   Oxycontin [oxycodone Hcl] Nausea And Vomiting   Pollen Extract Other (See Comments)   Sinus drainage        Medication List        Accurate as of September 30, 2022  4:24 PM. If you have any questions, ask your nurse or doctor.          acetaminophen 325 MG tablet Commonly known as: TYLENOL Take 650 mg by mouth every 6 (six) hours as needed for headache (pain).   antiseptic oral rinse Liqd 15 mLs by Mouth Rinse route as needed for dry mouth or mouth pain.   calcium carbonate 500 MG chewable tablet Commonly known as: TUMS - dosed in mg elemental calcium Chew 1 tablet by mouth as needed for indigestion or heartburn.   clopidogrel 75 MG tablet Commonly known as: PLAVIX TAKE 1 TABLET BY MOUTH EVERY DAY   cycloSPORINE 0.05 % ophthalmic emulsion Commonly known as: RESTASIS Place 1 drop into both eyes as needed.   ketoconazole 2 % shampoo Commonly known as: NIZORAL Apply topically 2 (two) times a week.   levothyroxine 100 MCG tablet Commonly known as: SYNTHROID Take 1 tablet (100 mcg total) by mouth daily.   losartan 50 MG tablet Commonly known as: COZAAR Take 1 tablet (50 mg total) by mouth daily.   nitroGLYCERIN 0.4 MG SL tablet Commonly known as: NITROSTAT Place 1 tablet (0.4 mg total) under the tongue every 5 (five) minutes as needed for up to 25 days for chest pain.   omeprazole 40 MG capsule Commonly known as: PRILOSEC Take 1 capsule (40 mg total) by mouth as needed.   pravastatin 40 MG tablet Commonly known as: PRAVACHOL Take 1 tablet (40  mg total) by mouth daily.   Systane 0.4-0.3 % Gel ophthalmic gel Generic  drug: Polyethyl Glycol-Propyl Glycol Place 1 application. into both eyes at bedtime.   Zolpidem Tartrate 3.5 MG Subl TAKE 1 TABLET BY MOUTH EVERY DAY AT BEDTIME AS NEEDED        Review of Systems:  Review of Systems  Health Maintenance  Topic Date Due   Colonoscopy  10/25/2021   Medicare Annual Wellness (AWV)  03/05/2022   INFLUENZA VACCINE  08/19/2022   COVID-19 Vaccine (4 - 2023-24 season) 09/19/2022   Pneumonia Vaccine 60+ Years old  Completed   DEXA SCAN  Completed   Zoster Vaccines- Shingrix  Completed   HPV VACCINES  Aged Out   DTaP/Tdap/Td  Discontinued    Physical Exam: Vitals:   09/30/22 1446  BP: 122/78  Pulse: 77  Resp: 18  Temp: 97.7 F (36.5 C)  SpO2: 99%  Weight: 158 lb 6.4 oz (71.8 kg)  Height: 5\' 3"  (1.6 m)   Body mass index is 28.06 kg/m. Physical Exam  Labs reviewed: Basic Metabolic Panel: Recent Labs    02/18/22 1235 06/15/22 1706 07/21/22 0522 08/03/22 0729 09/16/22 1510  NA  --  141 140  --  139  K  --  3.9 3.7  --  4.0  CL  --  108 107  --  106  CO2  --  26 23  --  26  GLUCOSE  --  102* 117*  --  119*  BUN  --  19 17  --  14  CREATININE  --  0.81 0.69  --  0.65  CALCIUM  --  9.1 9.3  --  9.3  TSH 0.168*  --   --  0.17*  --    Liver Function Tests: Recent Labs    02/18/22 1148 06/15/22 1706 07/21/22 0522  AST 17 18 18   ALT 15 17 16   ALKPHOS 69 58 67  BILITOT 0.9 0.5 0.7  PROT 7.4 6.7 6.7  ALBUMIN 4.1 3.7 3.9   Recent Labs    06/15/22 1706  LIPASE 48   No results for input(s): "AMMONIA" in the last 8760 hours. CBC: Recent Labs    02/18/22 1148 06/15/22 1706 07/21/22 0522 09/16/22 1510  WBC 6.5 7.0 6.3 9.7  NEUTROABS 3.9 4.2 3.5  --   HGB 14.3 13.6 13.7 14.3  HCT 43.6 40.6 41.4 43.9  MCV 90.5 90.0 90.4 91.1  PLT 263 243 243 247   Lipid Panel: No results for input(s): "CHOL", "HDL", "LDLCALC", "TRIG", "CHOLHDL", "LDLDIRECT" in the last 8760 hours. Lab Results  Component Value Date   HGBA1C 6.1 (H)  06/07/2021    Procedures since last visit: No results found.  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   Insomnia Sleeps too much during day, failed dc Ambien, off Valium, refused CPAP, feels a veil in front of left eye, more noticeable left facial weakness  History of stroke  Hx of CVA, on plavix. Statin,  residual of  poor left peripheral vision   Acquired hypothyroidism  on Levothyroxine-declined dose adjustment.  TSH 0.168 02/18/22, 0.17 08/03/22  Essential hypertension blood pressure is controlled on Losartan qd. added Imdur 30mg  tid by Cardiology. Followed by Cardiology. CTA negative 09/01/20. Bun/creat 17/0.69 07/21/22  Gastroesophageal reflux disease  stable Omeprazole, FDgard helped,  saw GI, EGD 12/26/19. Vit B12 383 12/02/20, Hgb 13.7 07/21/22. GI 09/03/21, pending repeat endoscope.   Incontinent of urine  Urinary frequency/leakage, 2-3x/night, stopped taking Gemtesa, acupuncture improved initially. Saw urology.   Chronic sinusitis Hx of chronic rhinitis/sphenoidal sinusitis, s/p surgery, improved SOB, wheezing, but still has nasal/right ear congestion, on Astelin, Flonase, Bronchodilator, underwent Pulmonology, ENT, GI, Allergy evaluation, had spirometry 10/20/20, Echo 60-65% EF 04/16/20, CTA 09/01/20. Saw ENT   Labs/tests ordered:  none  Next appt:  3 months.

## 2022-09-30 NOTE — Assessment & Plan Note (Signed)
stable Omeprazole, FDgard helped,  saw GI, EGD 12/26/19. Vit B12 383 12/02/20, Hgb 13.7 07/21/22. GI 09/03/21, pending repeat endoscope.

## 2022-10-04 ENCOUNTER — Telehealth: Payer: Self-pay | Admitting: Gastroenterology

## 2022-10-04 NOTE — Telephone Encounter (Signed)
PT is calling to speak with a nurse regarding her swallowing issues. She has been doing more coughing lately and chokes off of just drinking water. Please advise

## 2022-10-05 NOTE — Telephone Encounter (Signed)
She is able to swallow her food and fluids. Notes increase in spells of choking and coughing. She is hoping for a sooner procedure date. Patient confirms she is taking Plavix. Presently scheduled for an EGD at [facility] on 11/15/22. Is it okay to move her up?

## 2022-10-06 NOTE — Telephone Encounter (Signed)
I have advised the pt that there are no sooner appts available at this time.  The pt has been advised of the information and verbalized understanding.

## 2022-10-07 ENCOUNTER — Non-Acute Institutional Stay: Payer: Medicare HMO | Admitting: Nurse Practitioner

## 2022-10-07 ENCOUNTER — Telehealth: Payer: Self-pay

## 2022-10-07 ENCOUNTER — Encounter: Payer: Self-pay | Admitting: Nurse Practitioner

## 2022-10-07 VITALS — BP 118/76 | HR 75 | Temp 98.0°F

## 2022-10-07 DIAGNOSIS — F419 Anxiety disorder, unspecified: Secondary | ICD-10-CM

## 2022-10-07 DIAGNOSIS — R32 Unspecified urinary incontinence: Secondary | ICD-10-CM

## 2022-10-07 DIAGNOSIS — R400 Somnolence: Secondary | ICD-10-CM

## 2022-10-07 DIAGNOSIS — E782 Mixed hyperlipidemia: Secondary | ICD-10-CM

## 2022-10-07 DIAGNOSIS — J312 Chronic pharyngitis: Secondary | ICD-10-CM

## 2022-10-07 DIAGNOSIS — R52 Pain, unspecified: Secondary | ICD-10-CM

## 2022-10-07 DIAGNOSIS — K449 Diaphragmatic hernia without obstruction or gangrene: Secondary | ICD-10-CM

## 2022-10-07 DIAGNOSIS — F5101 Primary insomnia: Secondary | ICD-10-CM

## 2022-10-07 DIAGNOSIS — J329 Chronic sinusitis, unspecified: Secondary | ICD-10-CM

## 2022-10-07 DIAGNOSIS — M159 Polyosteoarthritis, unspecified: Secondary | ICD-10-CM

## 2022-10-07 DIAGNOSIS — R1319 Other dysphagia: Secondary | ICD-10-CM

## 2022-10-07 DIAGNOSIS — M150 Primary generalized (osteo)arthritis: Secondary | ICD-10-CM

## 2022-10-07 DIAGNOSIS — E039 Hypothyroidism, unspecified: Secondary | ICD-10-CM

## 2022-10-07 DIAGNOSIS — C434 Malignant melanoma of scalp and neck: Secondary | ICD-10-CM | POA: Diagnosis not present

## 2022-10-07 DIAGNOSIS — I10 Essential (primary) hypertension: Secondary | ICD-10-CM

## 2022-10-07 DIAGNOSIS — M8580 Other specified disorders of bone density and structure, unspecified site: Secondary | ICD-10-CM

## 2022-10-07 DIAGNOSIS — Z780 Asymptomatic menopausal state: Secondary | ICD-10-CM

## 2022-10-07 DIAGNOSIS — Z8673 Personal history of transient ischemic attack (TIA), and cerebral infarction without residual deficits: Secondary | ICD-10-CM

## 2022-10-07 NOTE — Telephone Encounter (Signed)
Patient spoke with patient care advocate, Darlene [name] and cancelled her appointment for today at the [facility] due to being unable to walk. Per Darlene [name] patient asked if ManX would come see her in her room.  Per Darlene [name] , she consulted with ManX and was advised that due to insurance regulations and compliance, she would be unable to see patient in her room, as she is an independent living resident.   Darlene [name] asked that I call patient back. Spoke with Darlene [name] and she expressed that for the last 36 hours she had been very limited in her mobility and has contacted the triage nurse at [facility] as she believes it's related to a cancer drug. Patient stated she is in severe pain as related to her right leg, however she will have the cancer doctor further address.   Message will be sent to [name], [name], NP as a Lorain Childes

## 2022-10-07 NOTE — Assessment & Plan Note (Signed)
stable Omeprazole, FDgard helped,  saw GI, EGD 12/26/19. Vit B12 383 12/02/20, Hgb 13.7 07/21/22. GI 09/03/21, pending repeat endoscope.

## 2022-10-07 NOTE — Assessment & Plan Note (Signed)
Incidental finding of melanoma in nose, follows oncology             Chronic sore throat: multiple factorials, GERD vs chronic rhinitis/sinusitis,  ENT

## 2022-10-07 NOTE — Assessment & Plan Note (Signed)
blood pressure is controlled on Losartan qd. added Imdur 30mg  tid by Cardiology. Followed by Cardiology. CTA negative 09/01/20. Bun/creat 17/0.69 07/21/22

## 2022-10-07 NOTE — Assessment & Plan Note (Signed)
takes Pravastatin, LDL 94 05/12/21

## 2022-10-07 NOTE — Assessment & Plan Note (Signed)
Malignant melanoma of nasal cavity, followed by oncology, ENT, dental team, on immunotherapy

## 2022-10-07 NOTE — Assessment & Plan Note (Signed)
pain allover after immunotherapy infusion,, cancer center recommended Tylenol up to 3000mg  a day for pain, no pain except right knee when standing up, no injury or swelling redness, warmth. Also c/o poor appetite, the patient is aware of the importance of hydration

## 2022-10-07 NOTE — Assessment & Plan Note (Signed)
escalated since  recurrent melanoma in nose, Valium available to her.

## 2022-10-07 NOTE — Assessment & Plan Note (Signed)
DEXA 12/25/20, t score -1.0,  10 years possibility major fx 11%, hip fx 2.6%. Vit D, Cal-diet rich,  Vit D 3 39 12/26/20

## 2022-10-07 NOTE — Assessment & Plan Note (Signed)
Prn Ambien

## 2022-10-07 NOTE — Assessment & Plan Note (Signed)
Swallow studay 05/12/20, worked with ST, GI f/u

## 2022-10-07 NOTE — Assessment & Plan Note (Signed)
on Levothyroxine-declined dose adjustment.  TSH 0.168 02/18/22, 0.17 08/03/22

## 2022-10-07 NOTE — Assessment & Plan Note (Signed)
Hx of chronic rhinitis/sphenoidal sinusitis, s/p surgery, improved SOB, wheezing, but still has nasal/right ear congestion, on Astelin, Flonase, Bronchodilator, underwent Pulmonology, ENT, GI, Allergy evaluation, had spirometry 10/20/20, Echo 60-65% EF 04/16/20, CTA 09/01/20. Saw ENT

## 2022-10-07 NOTE — Assessment & Plan Note (Signed)
saw Ortho. Cervical spinal stenosis, MR 02/26/20, C6-C7

## 2022-10-07 NOTE — Progress Notes (Signed)
Location:   Clinic [facility]   Place of Service:  Clinic (12) Provider: Chipper Oman NP  Code Status: DNR Goals of Care:     10/07/2022    1:39 PM  Advanced Directives  Does Patient Have a Medical Advance Directive? Yes  Type of Advance Directive Healthcare Power of Attorney  Does patient want to make changes to medical advance directive? No - Patient declined  Copy of Healthcare Power of Attorney in Chart? Yes - validated most recent copy scanned in chart (See row information)     Chief Complaint  Patient presents with   Acute Visit    Pain all over, worse in right leg,  and decreased mobility. Patient states this is a side effect of a cancer drug    HPI: Patient is a 89 y.o. female seen today for pain allover after immunotherapy infusion,, cancer center recommended Tylenol up to 3000mg  a day for pain, no pain except right knee when standing up, no injury or swelling redness, warmth. Also c/o poor appetite, the patient is aware of the importance of hydration, possible dehydration, cancer center 1:30 pm tomorrow-suggested to ED if needed, declined to [facility] for further care.        After 2nd Shingrix, feels aches, headache, more nasal/ear congestion.  07/21/22 ED eval, dehydration, gum swelling             Malignant melanoma of nasal cavity, followed by oncology, ENT, dental team, on immunotherapy             Anxiety, chronic, escalated since  recurrent melanoma in nose, Valium available to her.               Sleeps too much during day, failed dc Ambien, off Valium, refused CPAP, feels a veil in front of left eye, more noticeable left facial weakness Hx of chronic rhinitis/sphenoidal sinusitis, s/p surgery, improved SOB, wheezing, but still has nasal/right ear congestion, on Astelin, Flonase, Bronchodilator, underwent Pulmonology, ENT, GI, Allergy evaluation, had spirometry 10/20/20, Echo 60-65% EF 04/16/20, CTA 09/01/20. Saw ENT Incidental finding of melanoma in nose, follows oncology              Chronic sore throat: multiple factorials, GERD vs chronic rhinitis/sinusitis,  ENT  Insomnia Ambien             Hx of CVA, on plavix. Statin,  residual of  poor left peripheral vision  Hypothyroidism, on Levothyroxine-declined dose adjustment.  TSH 0.168 02/18/22, 0.17 08/03/22             HTN, blood pressure is controlled on Losartan qd. added Imdur 30mg  tid by Cardiology. Followed by Cardiology. CTA negative 09/01/20. Bun/creat 17/0.69 07/21/22            GERD, stable Omeprazole, FDgard helped,  saw GI, EGD 12/26/19. Vit B12 383 12/02/20, Hgb 13.7 07/21/22. GI 09/03/21, pending repeat endoscope.              Lactose intolerance, diet adjustment, f/u GI             Esophageal dysmotility, Swallow studay 05/12/20, worked with ST, GI f/u             OSA sleep study, saw Pulmonology.  Not using CPAP                                     OA, saw Ortho. Cervical spinal stenosis, MR  02/26/20, C6-C7             Hyperlipidemia, takes Pravastatin, LDL 94 05/12/21             Urinary frequency/leakage, 2-3x/night, stopped taking Gemtesa, acupuncture improved initially. Saw urology.              Osteopenia, DEXA 12/25/20, t score -1.0,  10 years possibility major fx 11%, hip fx 2.6%. Vit D, Cal-diet rich,  Vit D 3 39 12/26/20             HOH audiology eval, ENT placed tube in the right ear-failed-again, hearing aids       Past Medical History:  Diagnosis Date   Anxiety    Atypical mole 11/11/2020   Left Inner Knee (moderate-free)   Colon polyps    Depression    Hyperlipidemia    Hypertension    Hypothyroidism    Melanoma in situ (HCC) 09/19/2018   Right Arm (excision)   Skin cancer    Sleep apnea    Stroke [facility]) 2008    Past Surgical History:  Procedure Laterality Date   ADENOIDECTOMY     APPENDECTOMY  1960   CATARACT EXTRACTION Bilateral    COLONOSCOPY     GUM SURGERY  03/2019   LUMBAR DISC SURGERY  2014   MELANOMA EXCISION  2020   MESH APPLIED TO LAP PORT     SINUS EXPLORATION      11/17/2021 operation for right nasal cavity and 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   TONSILLECTOMY  1940    Allergies  Allergen Reactions   Lactose Intolerance (Gi) Other (See Comments)    indigestion   Norco [Hydrocodone-Acetaminophen] Nausea And Vomiting   Dust Mite Extract Other (See Comments)    Sinus drainage   Oxycontin [Oxycodone Hcl] Nausea And Vomiting   Pollen Extract Other (See Comments)    Sinus drainage    Allergies as of 10/07/2022       Reactions   Lactose Intolerance (gi) Other (See Comments)   indigestion   Norco [hydrocodone-acetaminophen] Nausea And Vomiting   Dust Mite Extract Other (See Comments)   Sinus drainage   Oxycontin [oxycodone Hcl] Nausea And Vomiting   Pollen Extract Other (See Comments)   Sinus drainage        Medication List        Accurate as of October 07, 2022 11:59 PM. If you have any questions, ask your nurse or doctor.          acetaminophen 325 MG tablet Commonly known as: TYLENOL Take 650 mg by mouth every 6 (six) hours as needed for headache (pain).   acetaminophen 500 MG tablet Commonly known as: TYLENOL Take 1,000 mg by mouth every 8 (eight) hours.   antiseptic oral rinse Liqd 15 mLs by Mouth Rinse route as needed for dry mouth or mouth pain.   calcium carbonate 500 MG chewable tablet Commonly known as: TUMS - dosed in mg elemental calcium Chew 1 tablet by mouth as needed for indigestion or heartburn.   clopidogrel 75 MG tablet Commonly known as: PLAVIX TAKE 1 TABLET BY MOUTH EVERY DAY   cycloSPORINE 0.05 % ophthalmic emulsion Commonly known as: RESTASIS Place 1 drop into both eyes as needed.   ketoconazole 2 % shampoo Commonly known as: NIZORAL Apply topically 2 (two) times a week.   levothyroxine 100 MCG tablet Commonly known as: SYNTHROID Take 1 tablet (100 mcg total) by mouth daily.   losartan  50 MG tablet Commonly known as: COZAAR Take 1 tablet (50 mg total) by  mouth daily.   nitroGLYCERIN 0.4 MG SL tablet Commonly known as: NITROSTAT Place 1 tablet (0.4 mg total) under the tongue every 5 (five) minutes as needed for up to 25 days for chest pain.   omeprazole 40 MG capsule Commonly known as: PRILOSEC Take 1 capsule (40 mg total) by mouth as needed.   pravastatin 40 MG tablet Commonly known as: PRAVACHOL Take 1 tablet (40 mg total) by mouth daily.   Systane 0.4-0.3 % Gel ophthalmic gel Generic drug: Polyethyl Glycol-Propyl Glycol Place 1 application. into both eyes at bedtime.   Zolpidem Tartrate 3.5 MG Subl TAKE 1 TABLET BY MOUTH EVERY DAY AT BEDTIME AS NEEDED        Review of Systems:  Review of Systems  Constitutional:  Positive for appetite change and fatigue. Negative for fever.  HENT:  Positive for congestion and hearing loss. Negative for nosebleeds, sinus pressure, sore throat and voice change.        Chronic sinusitis, underwent ENT, sleep study-sleep apnea. Gum irritation  Eyes:  Positive for visual disturbance.       Left lateral visual field deficit. Dry eyes  Respiratory:  Negative for cough and shortness of breath.   Cardiovascular:  Negative for leg swelling.  Gastrointestinal:  Negative for abdominal pain and constipation.  Genitourinary:  Positive for frequency. Negative for dysuria and urgency.       Urinary leakage, urinary frequency 2-3x/night. Doing Kegel exercise.   Musculoskeletal:  Positive for arthralgias. Negative for back pain and gait problem.       Aches allover  Skin:  Negative for color change.       Mohs dorsum right hand, healed.   Neurological:  Negative for speech difficulty, weakness and headaches.  Psychiatric/Behavioral:  Positive for sleep disturbance. Negative for dysphoric mood. The patient is not nervous/anxious.        Early am awake, difficulty returning asleep.     Health Maintenance  Topic Date Due   Colonoscopy  10/25/2021   Medicare Annual Wellness (AWV)  03/05/2022   INFLUENZA  VACCINE  08/19/2022   COVID-19 Vaccine (4 - 2023-24 season) 09/19/2022   Pneumonia Vaccine 69+ Years old  Completed   DEXA SCAN  Completed   Zoster Vaccines- Shingrix  Completed   HPV VACCINES  Aged Out   DTaP/Tdap/Td  Discontinued    Physical Exam: Vitals:   10/07/22 1338  BP: 118/76  Pulse: 75  Temp: 98 F (36.7 C)  TempSrc: Temporal  SpO2: 95%   There is no height or weight on file to calculate BMI. Physical Exam Vitals and nursing note reviewed.  Constitutional:      Appearance: Normal appearance.  HENT:     Head: Normocephalic and atraumatic.     Ears:     Comments: Right ear tube placed, mild erythema, no bleeding.     Nose: Nose normal.     Comments: Redness right nostril, no bleed.     Mouth/Throat:     Mouth: Mucous membranes are moist.     Pharynx: No oropharyngeal exudate or posterior oropharyngeal erythema.  Eyes:     Extraocular Movements: Extraocular movements intact.     Conjunctiva/sclera: Conjunctivae normal.     Pupils: Pupils are equal, round, and reactive to light.     Comments: Left peripheral visual field deficit since CVA 2006  Cardiovascular:     Rate and Rhythm: Normal rate and  regular rhythm.     Heart sounds: No murmur heard. Pulmonary:     Effort: Pulmonary effort is normal.     Breath sounds: Rales present.     Comments: Bibasilar rales.  Abdominal:     General: Bowel sounds are normal.     Palpations: Abdomen is soft.     Tenderness: There is no abdominal tenderness.  Musculoskeletal:     Cervical back: Normal range of motion and neck supple.     Right lower leg: No edema.     Left lower leg: No edema.     Comments: Aches allover, R knee pain when standing, no s/s of infection or injury.   Skin:    General: Skin is warm and dry.  Neurological:     General: No focal deficit present.     Mental Status: She is alert and oriented to person, place, and time. Mental status is at baseline.     Motor: Weakness present.     Gait: Gait  normal.     Comments: Left facial weakness  Psychiatric:        Mood and Affect: Mood normal.        Behavior: Behavior normal.        Thought Content: Thought content normal.        Judgment: Judgment normal.   \  Labs reviewed: Basic Metabolic Panel: Recent Labs    02/18/22 1235 06/15/22 1706 07/21/22 0522 08/03/22 0729 09/16/22 1510  NA  --  141 140  --  139  K  --  3.9 3.7  --  4.0  CL  --  108 107  --  106  CO2  --  26 23  --  26  GLUCOSE  --  102* 117*  --  119*  BUN  --  19 17  --  14  CREATININE  --  0.81 0.69  --  0.65  CALCIUM  --  9.1 9.3  --  9.3  TSH 0.168*  --   --  0.17*  --    Liver Function Tests: Recent Labs    02/18/22 1148 06/15/22 1706 07/21/22 0522  AST 17 18 18   ALT 15 17 16   ALKPHOS 69 58 67  BILITOT 0.9 0.5 0.7  PROT 7.4 6.7 6.7  ALBUMIN 4.1 3.7 3.9   Recent Labs    06/15/22 1706  LIPASE 48   No results for input(s): "AMMONIA" in the last 8760 hours. CBC: Recent Labs    02/18/22 1148 06/15/22 1706 07/21/22 0522 09/16/22 1510  WBC 6.5 7.0 6.3 9.7  NEUTROABS 3.9 4.2 3.5  --   HGB 14.3 13.6 13.7 14.3  HCT 43.6 40.6 41.4 43.9  MCV 90.5 90.0 90.4 91.1  PLT 263 243 243 247   Lipid Panel: No results for input(s): "CHOL", "HDL", "LDLCALC", "TRIG", "CHOLHDL", "LDLDIRECT" in the last 8760 hours. Lab Results  Component Value Date   HGBA1C 6.1 (H) 06/07/2021    Procedures since last visit: No results found.  Assessment/Plan  Body aches pain allover after immunotherapy infusion,, cancer center recommended Tylenol up to 3000mg  a day for pain, no pain except right knee when standing up, no injury or swelling redness, warmth. 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    Daytime sleepiness Sleeps too much during day, failed dc Ambien, off Valium, refused CPAP, feels a veil in front of left eye, more noticeable left facial weakness  Chronic sinusitis Hx of chronic rhinitis/sphenoidal sinusitis, s/p surgery, improved SOB, wheezing, but still has nasal/right ear congestion, on Astelin, Flonase, Bronchodilator, underwent Pulmonology, ENT, GI, Allergy evaluation, had spirometry 10/20/20, Echo 60-65% EF 04/16/20, CTA 09/01/20. Saw ENT  Chronic sore throat Incidental finding of melanoma in nose, follows oncology             Chronic sore throat: multiple factorials, GERD vs chronic rhinitis/sinusitis,  ENT   Insomnia Prn Ambien.   History of stroke Hx of CVA, on plavix. Statin,  residual of  poor left peripheral vision   Acquired hypothyroidism on Levothyroxine-declined dose adjustment.  TSH 0.168 02/18/22, 0.17 08/03/22  Essential hypertension blood pressure is controlled on Losartan qd. added Imdur 30mg  tid by Cardiology. Followed by Cardiology. CTA negative 09/01/20. Bun/creat 17/0.69 07/21/22  Hiatal hernia stable Omeprazole, FDgard helped,  saw GI, EGD 12/26/19. Vit B12 383 12/02/20, Hgb 13.7 07/21/22. GI 09/03/21, pending repeat endoscope.   Dysphagia Swallow studay 05/12/20, worked with ST, GI f/u  Osteoarthritis, multiple sites  saw Ortho. Cervical spinal stenosis, MR 02/26/20, C6-C7  Hyperlipidemia  takes Pravastatin, LDL 94 05/12/21  Incontinent of urine  Urinary frequency/leakage, 2-3x/night, stopped taking Gemtesa, acupuncture improved initially. Saw urology.   Osteopenia after menopause  DEXA 12/25/20, t score -1.0,  10 years possibility major fx 11%, hip fx 2.6%. Vit D, Cal-diet rich,  Vit D 3 39 12/26/20   Labs/tests ordered:  none  Next appt:  prn

## 2022-10-07 NOTE — Assessment & Plan Note (Signed)
Sleeps too much during day, failed dc Ambien, off Valium, refused CPAP, feels a veil in front of left eye, more noticeable left facial weakness

## 2022-10-07 NOTE — Assessment & Plan Note (Signed)
Hx of CVA, on plavix. Statin,  residual of  poor left peripheral vision  ?

## 2022-10-07 NOTE — Assessment & Plan Note (Signed)
Urinary frequency/leakage, 2-3x/night, stopped taking Gemtesa, acupuncture improved initially. Saw urology.

## 2022-10-09 ENCOUNTER — Encounter: Payer: Self-pay | Admitting: Nurse Practitioner

## 2022-10-14 NOTE — Telephone Encounter (Signed)
Patient chart was opened error

## 2022-10-17 NOTE — Progress Notes (Deleted)
Cardiology Clinic Note   Date: 10/17/2022 ID: Candra, Altheide Sep 05, 1933, MRN [id number]  Primary Cardiologist:  None  Patient Profile    Darlene [name] is a 89 y.o. female who presents to the clinic today for ***    Past medical history significant for: Prinzmetal angina. Echo 04/16/2020: EF 60 to 65%.  No RWMA.  Normal diastolic filling pattern.  Mild TR. Coronary CTA 09/01/2020: Calcium score of 0.  Normal coronary arteries. Palpitations. 2-week cardiac monitor 04/24/2019: Predominant rhythm is NSR.  Min HR 49 bpm, average HR 72 bpm, max HR 195 bpm. <1% PVC/PAC burden.  1 5 beat run of SVT.  No A-fib.  No heart block.  12 patient activated events correlated with sinus rhythm. Hypertension. Hyperlipidemia.*** Dizziness. OSA. Intolerant of CPAP. GERD. Hypothyroidism. Melanoma. CVA. 2006. Generalized anxiety disorder.     History of Present Illness    Darlene [name] is followed by Dr. Jacinto Halim for the above outlined history.  Patient was last seen in the office by Dr. Jacinto Halim on 03/29/2022.  She was doing well at that time and no medication changes were made.  Today, patient ***  Prinzmetal angina.  Echo March 2022 showed normal RV function without RWMA and normal diastolic filling pattern. Patient *** Continue as needed SL NTG. Palpitations.  2-week cardiac monitor April 2021 showed 1 5 beat run of SVT, <1% PVC/PAC burden, no A-fib. Patient *** Continue *** Hypertension. BP today *** Patient denies headaches, dizziness or vision changes. Continue losartan. Hyperlipidemia.*** History of CVA 2006.  Patient denies any residual effects from stroke.  Continue Plavix.  Managed by PCP.  ROS: All other systems reviewed and are otherwise negative except as noted in History of Present Illness.  Studies Reviewed       ***  Risk Assessment/Calculations    {Does this patient have ATRIAL FIBRILLATION?:[phone]} No BP recorded.  {Refresh Note OR Click here to enter BP  :1}***         Physical Exam    VS:  There were no vitals taken for this visit. , BMI There is no height or weight on file to calculate BMI.  GEN: Well nourished, well developed, in no acute distress. Neck: No JVD or carotid bruits. Cardiac: *** RRR. No murmurs. No rubs or gallops.   Respiratory:  Respirations regular and unlabored. Clear to auscultation without rales, wheezing or rhonchi. GI: Soft, nontender, nondistended. Extremities: Radials/DP/PT 2+ and equal bilaterally. No clubbing or cyanosis. No edema ***  Skin: Warm and dry, no rash. Neuro: Strength intact.  Assessment & Plan   ***  Disposition: ***     {Are you ordering a CV Procedure (e.g. stress test, cath, DCCV, TEE, etc)?   Press F2        :[id number]}   Signed, Etta Grandchild. Solveig Fangman, DNP, NP-C

## 2022-10-19 ENCOUNTER — Ambulatory Visit: Payer: Medicare HMO | Admitting: Student

## 2022-10-24 ENCOUNTER — Other Ambulatory Visit: Payer: Self-pay | Admitting: Nurse Practitioner

## 2022-10-28 NOTE — Telephone Encounter (Signed)
Left message on machine to call back  

## 2022-10-28 NOTE — Telephone Encounter (Signed)
Patient called stated she is in a lot of pain from her infusions and can not move forward with the hospital procedure.

## 2022-10-29 NOTE — Telephone Encounter (Signed)
The pt wants to cancel her procedure for EGD on 10/28 due to her reaction to infusion of cancer treatment drug.  She has joint pain, can't walk, and has lots of testing coming up and she is not able to proceed. She states if/when she wants to reschedule she will call back . I will add a recall to the system to check with her in 3 months.

## 2022-11-02 ENCOUNTER — Telehealth: Payer: Medicare HMO

## 2022-11-02 ENCOUNTER — Other Ambulatory Visit: Payer: Self-pay | Admitting: Orthopedic Surgery

## 2022-11-02 DIAGNOSIS — M25561 Pain in right knee: Secondary | ICD-10-CM

## 2022-11-02 NOTE — Telephone Encounter (Signed)
Referral made to [facility] for right knee pain. They are down the street from [facility] location. They are very quick to get in with. If she wants to be seen today recommend [facility] urgent care, [phone] no referral needed.

## 2022-11-02 NOTE — Telephone Encounter (Signed)
Patient called stating she needs a referral ASAP to ortho for right knee pain 10/10, patient states she is unable to be seen in office for an appointment or to perform a video visit. Patient emphasized that she has dealt with pain x 1 month or longer and initially it was thought to be a side effect of chemo and now the oncologist is recommending that she explore other sources of pain.

## 2022-11-05 ENCOUNTER — Ambulatory Visit: Payer: Medicare HMO | Admitting: Orthopaedic Surgery

## 2022-11-05 ENCOUNTER — Other Ambulatory Visit (INDEPENDENT_AMBULATORY_CARE_PROVIDER_SITE_OTHER): Payer: Self-pay

## 2022-11-05 ENCOUNTER — Other Ambulatory Visit (INDEPENDENT_AMBULATORY_CARE_PROVIDER_SITE_OTHER): Payer: Medicare HMO

## 2022-11-05 VITALS — BP 131/62 | HR 76

## 2022-11-05 DIAGNOSIS — R1031 Right lower quadrant pain: Secondary | ICD-10-CM

## 2022-11-05 DIAGNOSIS — M25561 Pain in right knee: Secondary | ICD-10-CM

## 2022-11-05 MED ORDER — METHYLPREDNISOLONE ACETATE 40 MG/ML IJ SUSP
40.0000 mg | INTRAMUSCULAR | Status: AC | PRN
Start: 2022-11-05 — End: 2022-11-05
  Administered 2022-11-05: 40 mg via INTRA_ARTICULAR

## 2022-11-05 MED ORDER — LIDOCAINE HCL 1 % IJ SOLN
10.0000 mL | INTRAMUSCULAR | Status: AC | PRN
Start: 2022-11-05 — End: 2022-11-05
  Administered 2022-11-05: 10 mL

## 2022-11-05 NOTE — Progress Notes (Signed)
Office Visit Note   Patient: Darlene [name]           Date of Birth: [birth year]           MRN: [id number] Visit Date: 11/05/2022              Requested by: Octavia Heir, NP [phone] [address] [place],  [place] [address] PCP: [name], [name], NP   Assessment & Plan: Visit Diagnoses:  1. Acute pain of right knee   2. Right groin pain     Plan: Patient has some pes bursitis of her right knee and also some knee arthritis.  Injected 10 cc lidocaine she walked in the room states it helped her pain about 50% and we added 1 cc cortisone.  She will let us know if she has ongoing problems.  She has multiple areas of tenderness which may be related to the immunotherapy versus depression versus fibromyalgia type symptoms.  I reviewed with her her hip and knee x-rays.  Hopefully this injection will give her some improvement in ambulation.  Follow-Up Instructions: No follow-ups on file.   Orders:  Orders Placed This Encounter  Procedures   Large Joint Inj   XR Knee 1-2 Views Right   XR HIP UNILAT W OR W/O PELVIS 1V RIGHT   No orders of the defined types were placed in this encounter.     Procedures: Large Joint Inj: R knee on 11/05/2022 3:01 PM Indications: pain and joint swelling Details: 22 G 1.5 in needle, anterolateral approach  Arthrogram: No  Medications: 40 mg methylPREDNISolone acetate 40 MG/ML; 10 mL lidocaine 1 % Outcome: tolerated well, no immediate complications Procedure, treatment alternatives, risks and benefits explained, specific risks discussed. Consent was given by the patient. Immediately prior to procedure a time out was called to verify the correct patient, procedure, equipment, support staff and site/side marked as required. Patient was prepped and draped in the usual sterile fashion.       Clinical Data: No additional findings.   Subjective: Chief Complaint  Patient presents with   Right Hip - Pain   Right Knee - Pain    HPI 89 year old female with history  of melanoma of the skin that was treated.  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  She states that immunotherapy makes her have aching pain and she has recently had significant increased pain in her right knee with difficulty walking.  She states her walking speed is down and she is also limping more having problems getting from sitting to standing.  Additionally she has had some cervical spondylosis we reviewed MRI of her lumbar spine which showed mild changes as well as cervical spine which did not show compression.  She has mild swelling of the right knee noted today with medial and lateral joint line tenderness.  Patient relates that her oncologist since she is post get CAT scans of her arms and legs intermittently with her PCP to look for tumor.  I discussed with her generally a PET scan is used for total body coverage for most malignancies.  Principal problem today's visit has been her right knee pain.  Review of Systems history of CVA hyperlipidemia.  Melanoma with recurrence versus second site melanoma.  On immunotherapy.   Objective: Vital Signs: BP 131/62   Pulse 76   Physical Exam Constitutional:      Appearance: She is well-developed.  HENT:     Head: Normocephalic.     Right Ear: External ear normal.     Left Ear: External ear normal. There is no impacted cerumen.  Eyes:     Pupils: Pupils are equal, round, and reactive to light.  Neck:     Thyroid: No thyromegaly.     Trachea: No tracheal deviation.  Cardiovascular:     Rate and Rhythm: Normal rate.  Pulmonary:     Effort: Pulmonary effort is normal.  Abdominal:     Palpations: Abdomen is soft.  Musculoskeletal:     Cervical back: No rigidity.  Skin:    General: Skin is warm and dry.  Neurological:     Mental Status: She is alert and oriented to person, place, and time.  Psychiatric:        Behavior: Behavior normal.      Ortho Exam negative logroll.  Mild discomfort with full extension of the knee.  Slightly more medial and the lateral joint line tenderness.  Mild crepitus knee extension 2+ knee effusion right knee no effusion left knee.  Patient has specific tenderness over biceps tendon trochanteric bursa left slightly worse than right bilateral pes bursa both right and left some medial and lateral knee joint line worse on the right than left knee.  Specialty Comments:  No specialty comments available.  Imaging: XR Knee 1-2 Views Right  Result Date: 11/05/2022 AP both knees standing lateral right knee obtained and reviewed.  Mild knee effusion 50% medial joint space narrowing medial compartment.  Lateral compartment is spared.  No acute bone changes. Impression: No acute changes right knee mild medial compartment narrowing.  XR HIP UNILAT W OR W/O PELVIS 1V RIGHT  Result Date: 11/05/2022 AP pelvis including hips demonstrates normal hip joints.  No lytic or sclerotic lesions in the pelvis. Impression: Normal hip radiographs.    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) 08/07/2022   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   Osteoarthritis, multiple sites 11/05/2021   Anxiety 10/29/2021   SCC (squamous cell carcinoma) 09/10/2021   Right-sided epistaxis 08/27/2021   HOH (hard of hearing) 08/20/2021   Sepsis (HCC) 06/06/2021   Lactose intolerance 05/14/2021   Osteopenia after menopause 01/22/2021   OSA (obstructive sleep apnea) 11/27/2020   Postmenopause 11/27/2020   Abnormal barium swallow 10/30/2020   Precordial pain    Other spondylosis with radiculopathy, cervical region 02/19/2020   Prediabetes 01/09/2020   Adverse reaction to COVID-19 vaccine 11/29/2019   Chronic cough 11/24/2019    Melanoma of skin (HCC) 08/24/2019   Palpitations 06/07/2019   Schatzki's ring 05/12/2019   Hiatal hernia 05/12/2019   Insomnia 03/29/2019   Back pain with left-sided sciatica 03/01/2019   Daytime sleepiness 11/30/2018   History of colonic polyps 09/03/2018   Dysphagia 09/03/2018   Pyrosis 09/03/2018   Chronic sore throat 09/03/2018   Actinic keratoses 02/23/2018   Seasonal and perennial allergic rhinitis 01/31/2018   Infected surgical wound 12/01/2017   Incontinent of urine 12/01/2017   Essential hypertension 09/06/2017   History of stroke 09/06/2017   Hyperlipidemia 09/06/2017   GAD (generalized anxiety disorder) 09/06/2017   Acquired hypothyroidism 09/06/2017   Chronic sinusitis 09/06/2017   Gastroesophageal reflux disease 09/06/2017   Past Medical History:  Diagnosis Date   Anxiety  Atypical mole 11/11/2020   Left Inner Knee (moderate-free)   Colon polyps    Depression    Hyperlipidemia    Hypertension    Hypothyroidism    Melanoma in situ (HCC) 09/19/2018   Right Arm (excision)   Skin cancer    Sleep apnea    Stroke [facility]) 2008    Family History  Problem Relation Age of Onset   Lung cancer Mother 46       smoker   Heart disease Mother    Stroke Father 68   Heart attack Father    Allergic rhinitis Sister    Hypertension Sister    CVA Brother    Lung cancer Maternal Grandfather    Heart attack Paternal Grandfather    Melanoma Daughter    Psoriasis Daughter    Rheum arthritis Daughter    Bipolar disorder Daughter    Colon cancer Neg Hx    Esophageal cancer Neg Hx    Inflammatory bowel disease Neg Hx    Liver disease Neg Hx    Pancreatic cancer Neg Hx    Stomach cancer Neg Hx    Rectal cancer Neg Hx     Past Surgical History:  Procedure Laterality Date   ADENOIDECTOMY     APPENDECTOMY  1960   CATARACT EXTRACTION Bilateral    COLONOSCOPY     GUM SURGERY  03/2019   LUMBAR DISC SURGERY  2014   MELANOMA EXCISION  2020   MESH APPLIED TO LAP PORT      SINUS EXPLORATION     11/17/2021 operation for right nasal cavity and 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   TONSILLECTOMY  1940   Social History   Occupational History   Occupation: retired Chemical engineer  Tobacco Use   Smoking status: Never   Smokeless tobacco: Never  Vaping Use   Vaping status: Never Used  Substance and Sexual Activity   Alcohol use: Not Currently    Comment: hardly ever   Drug use: Never   Sexual activity: Not Currently

## 2022-11-10 ENCOUNTER — Telehealth: Payer: Self-pay | Admitting: Orthopaedic Surgery

## 2022-11-10 ENCOUNTER — Other Ambulatory Visit: Payer: Self-pay | Admitting: Orthopaedic Surgery

## 2022-11-10 NOTE — Telephone Encounter (Signed)
Please advise 

## 2022-11-10 NOTE — Telephone Encounter (Signed)
Patient called back asked if Dr. Ophelia Charter can try to call her back. Patient said she is sorry she missed Dr. Ophelia Charter call. Patient said she don't know how much tylenol she can keep taking because of the pain she is having. Patient said the message Dr. Ophelia Charter left did not come through.  Patient asked if Dr. Ophelia Charter can call her back today. The number to contact patient is [phone]  Please see previous note

## 2022-11-10 NOTE — Telephone Encounter (Signed)
Patient called advised she is in a lot of pain. Patient said her right leg is really hurting. Patient asked what can she do or take to help with the pain? Patient asked if Dr. Ophelia Charter can call her back this afternoon after 2:00pm? The number to contact patient is [phone]

## 2022-11-11 NOTE — Telephone Encounter (Signed)
noted 

## 2022-11-15 ENCOUNTER — Ambulatory Visit (HOSPITAL_COMMUNITY): Admit: 2022-11-15 | Payer: Medicare HMO | Admitting: Gastroenterology

## 2022-11-15 ENCOUNTER — Encounter (HOSPITAL_COMMUNITY): Payer: Self-pay

## 2022-11-15 SURGERY — ESOPHAGOGASTRODUODENOSCOPY (EGD) WITH PROPOFOL
Anesthesia: Monitor Anesthesia Care

## 2022-11-26 ENCOUNTER — Ambulatory Visit: Payer: Medicare HMO | Admitting: Orthopaedic Surgery

## 2022-11-26 DIAGNOSIS — M25561 Pain in right knee: Secondary | ICD-10-CM | POA: Diagnosis not present

## 2022-11-26 NOTE — Progress Notes (Signed)
Office Visit Note   Patient: Darlene [name]           Date of Birth: 15-Jan-1934           MRN: [id number] Visit Date: 11/26/2022              Requested by: [name], [name], NP [id number] [place],  [place] [address] PCP: [name], [name], NP   Assessment & Plan: Visit Diagnoses:  1. Acute pain of right knee     Plan: Due to patient's ongoing pain problems we will proceed with MRI scan right knee rule out medial meniscal tear or partial tear semitendinosis or Gracilis at the PES .  Office follow-up after MRI scan.  Follow-Up Instructions: No follow-ups on file.   Orders:  Orders Placed This Encounter  Procedures   MR Knee Right w/o contrast   No orders of the defined types were placed in this encounter.     Procedures: No procedures performed   Clinical Data: No additional findings.   Subjective: Chief Complaint  Patient presents with   Right Knee - Pain    HPI52 year old female returns with ongoing problems with right knee pain and swelling.  Patient states she is having problems walking when she walks faster or balance a little bit better but she still has pain in the right knee and has been limping.  She has noticed swelling posteriorly in the popliteal region.  Previously she had injection in her knee all without sustained relief.  We did a pes bursa injection which helped and would follow that up with some cortisone she was better for period time and now has had recurrent medial pain sometimes radiates into the proximal tibia.  At times she gets sharp pain in her knee sometimes is posterior medial.  Sometimes she feels like it radiates up her thigh and down halfway to her ankle.  Patient states her malignant melanoma in the nasal cavity she has been told that she is now cancer free.  She continues to have problems with right knee pain and problems walking.  Previous x-rays showed some medial joint line narrowing without significant medial spurring.  Review of Systems all  systems updated unchanged from previous visit.   Objective: Vital Signs: There were no vitals taken for this visit.  Physical Exam Constitutional:      Appearance: She is well-developed.  HENT:     Head: Normocephalic.     Right Ear: External ear normal.     Left Ear: External ear normal. There is no impacted cerumen.  Eyes:     Pupils: Pupils are equal, round, and reactive to light.  Neck:     Thyroid: No thyromegaly.     Trachea: No tracheal deviation.  Cardiovascular:     Rate and Rhythm: Normal rate.  Pulmonary:     Effort: Pulmonary effort is normal.  Abdominal:     Palpations: Abdomen is soft.  Musculoskeletal:     Cervical back: No rigidity.  Skin:    General: Skin is warm and dry.  Neurological:     Mental Status: She is alert and oriented to person, place, and time.  Psychiatric:        Behavior: Behavior normal.     Ortho Exam patient has mild knee effusion some prominence posterior medial possibly small Baker's cyst medial joint line tenderness tenderness over the pes bursa.  Adductor is intact tenderness over the gracilis and semitendinosus attachment on the tibial crest.  Medial  joint line pain with hyperextension.  Specialty Comments:  No specialty comments available.  Imaging: No results found.   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) 08/07/2022   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   Osteoarthritis, multiple sites 11/05/2021   Anxiety 10/29/2021   SCC (squamous cell carcinoma) 09/10/2021   Right-sided epistaxis 08/27/2021   HOH (hard of hearing) 08/20/2021   Sepsis (HCC) 06/06/2021   Lactose intolerance 05/14/2021   Osteopenia after menopause 01/22/2021   OSA (obstructive sleep apnea) 11/27/2020   Postmenopause 11/27/2020   Abnormal barium  swallow 10/30/2020   Precordial pain    Other spondylosis with radiculopathy, cervical region 02/19/2020   Prediabetes 01/09/2020   Adverse reaction to COVID-19 vaccine 11/29/2019   Chronic cough 11/24/2019   Melanoma of skin (HCC) 08/24/2019   Palpitations 06/07/2019   Schatzki's ring 05/12/2019   Hiatal hernia 05/12/2019   Insomnia 03/29/2019   Back pain with left-sided sciatica 03/01/2019   Daytime sleepiness 11/30/2018   History of colonic polyps 09/03/2018   Dysphagia 09/03/2018   Pyrosis 09/03/2018   Chronic sore throat 09/03/2018   Actinic keratoses 02/23/2018   Seasonal and perennial allergic rhinitis 01/31/2018   Infected surgical wound 12/01/2017   Incontinent of urine 12/01/2017   Essential hypertension 09/06/2017   History of stroke 09/06/2017   Hyperlipidemia 09/06/2017   GAD (generalized anxiety disorder) 09/06/2017   Acquired hypothyroidism 09/06/2017   Chronic sinusitis 09/06/2017   Gastroesophageal reflux disease 09/06/2017   Past Medical History:  Diagnosis Date   Anxiety    Atypical mole 11/11/2020   Left Inner Knee (moderate-free)   Colon polyps    Depression    Hyperlipidemia    Hypertension    Hypothyroidism    Melanoma in situ (HCC) 09/19/2018   Right Arm (excision)   Skin cancer    Sleep apnea    Stroke [facility]) 2008    Family History  Problem Relation Age of Onset   Lung cancer Mother 14       smoker   Heart disease Mother    Stroke Father 39   Heart attack Father    Allergic rhinitis Sister    Hypertension Sister    CVA Brother    Lung cancer Maternal Grandfather    Heart attack Paternal Grandfather    Melanoma Daughter    Psoriasis Daughter    Rheum arthritis Daughter    Bipolar disorder Daughter    Colon cancer Neg Hx    Esophageal cancer Neg Hx    Inflammatory bowel disease Neg Hx    Liver disease Neg Hx    Pancreatic cancer Neg Hx    Stomach cancer Neg Hx    Rectal cancer Neg Hx     Past Surgical History:  Procedure  Laterality Date   ADENOIDECTOMY     APPENDECTOMY  1960   CATARACT EXTRACTION Bilateral    COLONOSCOPY     GUM SURGERY  03/2019   LUMBAR DISC SURGERY  2014   MELANOMA EXCISION  2020   MESH APPLIED TO LAP PORT     SINUS EXPLORATION     11/17/2021 operation for right nasal cavity and 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   TONSILLECTOMY  1940   Social History   Occupational History   Occupation: retired  school counselor and teacher  Tobacco Use   Smoking status: Never   Smokeless tobacco: Never  Vaping Use   Vaping status: Never Used  Substance and Sexual Activity   Alcohol use: Not Currently    Comment: hardly ever   Drug use: Never   Sexual activity: Not Currently

## 2022-11-29 ENCOUNTER — Telehealth: Payer: Self-pay | Admitting: Orthopaedic Surgery

## 2022-11-29 NOTE — Telephone Encounter (Signed)
Pt is requesting her MRI referral to be sent to  [facility] [address], [place], [place] [address] [phone] they can get her in a lot faster

## 2022-11-30 ENCOUNTER — Encounter: Payer: Self-pay | Admitting: Sports Medicine

## 2022-11-30 ENCOUNTER — Ambulatory Visit (INDEPENDENT_AMBULATORY_CARE_PROVIDER_SITE_OTHER): Payer: Medicare HMO | Admitting: Sports Medicine

## 2022-11-30 VITALS — BP 120/78 | HR 92 | Temp 96.5°F | Resp 16 | Ht 63.0 in | Wt 155.4 lb

## 2022-11-30 DIAGNOSIS — R040 Epistaxis: Secondary | ICD-10-CM | POA: Diagnosis not present

## 2022-11-30 DIAGNOSIS — C300 Malignant neoplasm of nasal cavity: Secondary | ICD-10-CM | POA: Diagnosis not present

## 2022-11-30 MED ORDER — AMOXICILLIN-POT CLAVULANATE 875-125 MG PO TABS: 1.0000 | ORAL_TABLET | Freq: Two times a day (BID) | ORAL | 0 refills | Status: DC

## 2022-11-30 MED ORDER — OXYMETAZOLINE HCL 0.05 % NA SOLN: 1.0000 | Freq: Two times a day (BID) | NASAL | 0 refills | Status: DC

## 2022-11-30 NOTE — Progress Notes (Unsigned)
Careteam: Patient Care Team: [name], [name], NP as PCP - General (Internal Medicine) Clark-Burning, Victorino Dike, PA-C (Inactive) (Dermatology) Yates Decamp, MD as Consulting Physician (Cardiology) Mansouraty, Netty Starring., MD as Consulting Physician (Gastroenterology) Drema Halon, MD (Inactive) as Consulting Physician (Otolaryngology) Glyn Ade, PA-C as Physician Assistant (Dermatology) Plonk, Wilder Glade, MD as Referring Physician (Otolaryngology)  PLACE OF SERVICE:  [facility]  Advanced Directive information Does Patient Have a Medical Advance Directive?: Yes, Type of Advance Directive: Healthcare Power of Attorney, Does patient want to make changes to medical advance directive?: No - Patient declined  Allergies  Allergen Reactions   Lactose Intolerance (Gi) Other (See Comments)    indigestion   Norco [Hydrocodone-Acetaminophen] Nausea And Vomiting   Dust Mite Extract Other (See Comments)    Sinus drainage   Oxycontin [Oxycodone Hcl] Nausea And Vomiting   Pollen Extract Other (See Comments)    Sinus drainage    Chief Complaint  Patient presents with   Acute Visit    Patient complains of nosebleed.      HPI: Patient is a 89 y.o. female presented to clinic for nosebleeds C/o sinus drainage she woke up this morning with huge scabs on both her nostrils  She tried to blow her nose and saw some blood in her sputum has h/o Rt Nasal melanoma Using nasal mist  States her nose was plugged up this morning  follows with ENT at [facility]  Denies fevers, dizziness Denies sinus pain  C/o chronic discomfort in her throat , was scheduled for EGD  but pt was scheduled to January as per patient       Review of Systems:  Review of Systems  Constitutional:  Negative for chills and fever.  HENT:  Positive for nosebleeds. Negative for congestion and sore throat.   Eyes:  Negative for double vision.  Respiratory:  Negative for cough, sputum production and  shortness of breath.   Cardiovascular:  Negative for chest pain, palpitations and leg swelling.  Gastrointestinal:  Negative for abdominal pain, heartburn and nausea.  Genitourinary:  Negative for dysuria, frequency and hematuria.  Musculoskeletal:  Negative for falls and myalgias.  Neurological:  Negative for dizziness, sensory change and focal weakness.     Past Medical History:  Diagnosis Date   Anxiety    Atypical mole 11/11/2020   Left Inner Knee (moderate-free)   Colon polyps    Depression    Hyperlipidemia    Hypertension    Hypothyroidism    Melanoma in situ (HCC) 09/19/2018   Right Arm (excision)   Skin cancer    Sleep apnea    Stroke [facility]) 2008   Past Surgical History:  Procedure Laterality Date   ADENOIDECTOMY     APPENDECTOMY  1960   CATARACT EXTRACTION Bilateral    COLONOSCOPY     GUM SURGERY  03/2019   LUMBAR DISC SURGERY  2014   MELANOMA EXCISION  2020   MESH APPLIED TO LAP PORT     SINUS EXPLORATION     11/17/2021 operation for right nasal cavity and 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   TONSILLECTOMY  1940   Social History:   reports that she has never smoked. She has never used smokeless tobacco. She reports that she does not currently use alcohol. She reports that she does not use drugs.  Family History  Problem Relation Age of Onset   Lung cancer Mother 57       smoker  Heart disease Mother    Stroke Father 51   Heart attack Father    Allergic rhinitis Sister    Hypertension Sister    CVA Brother    Lung cancer Maternal Grandfather    Heart attack Paternal Grandfather    Melanoma Daughter    Psoriasis Daughter    Rheum arthritis Daughter    Bipolar disorder Daughter    Colon cancer Neg Hx    Esophageal cancer Neg Hx    Inflammatory bowel disease Neg Hx    Liver disease Neg Hx    Pancreatic cancer Neg Hx    Stomach cancer Neg Hx    Rectal cancer Neg Hx     Medications: Patient's Medications  New  Prescriptions   No medications on file  Previous Medications   ACETAMINOPHEN (TYLENOL) 325 MG TABLET    Take 650 mg by mouth every 6 (six) hours as needed for headache (pain).   ACETAMINOPHEN (TYLENOL) 500 MG TABLET    Take 1,000 mg by mouth every 8 (eight) hours.   ANTISEPTIC ORAL RINSE (BIOTENE) LIQD    15 mLs by Mouth Rinse route as needed for dry mouth or mouth pain.   CALCIUM CARBONATE (TUMS - DOSED IN MG ELEMENTAL CALCIUM) 500 MG CHEWABLE TABLET    Chew 1 tablet by mouth as needed for indigestion or heartburn.   CLOPIDOGREL (PLAVIX) 75 MG TABLET    TAKE 1 TABLET BY MOUTH EVERY DAY   CYCLOSPORINE (RESTASIS) 0.05 % OPHTHALMIC EMULSION    Place 1 drop into both eyes as needed.   KETOCONAZOLE (NIZORAL) 2 % SHAMPOO    Apply topically 2 (two) times a week.   LOSARTAN (COZAAR) 50 MG TABLET    Take 1 tablet (50 mg total) by mouth daily.   NITROGLYCERIN (NITROSTAT) 0.4 MG SL TABLET    Place 1 tablet (0.4 mg total) under the tongue every 5 (five) minutes as needed for up to 25 days for chest pain.   OMEPRAZOLE (PRILOSEC) 40 MG CAPSULE    Take 1 capsule (40 mg total) by mouth as needed.   POLYETHYL GLYCOL-PROPYL GLYCOL (SYSTANE) 0.4-0.3 % GEL OPHTHALMIC GEL    Place 1 application. into both eyes at bedtime.   PRAVASTATIN (PRAVACHOL) 40 MG TABLET    Take 1 tablet (40 mg total) by mouth daily.   SYNTHROID 100 MCG TABLET    TAKE 1 TABLET BY MOUTH EVERY DAY BEFORE BREAKFAST   ZOLPIDEM TARTRATE 3.5 MG SUBL    TAKE 1 TABLET BY MOUTH EVERY DAY AT BEDTIME AS NEEDED  Modified Medications   No medications on file  Discontinued Medications   No medications on file    Physical Exam:  Vitals:   11/30/22 1116  BP: 120/78  Pulse: 92  Resp: 16  Temp: (!) 96.5 F (35.8 C)  SpO2: 97%  Weight: 155 lb 6.4 oz (70.5 kg)  Height: 5\' 3"  (1.6 m)   Body mass index is 27.53 kg/m. Wt Readings from Last 3 Encounters:  11/30/22 155 lb 6.4 oz (70.5 kg)  09/30/22 158 lb 6.4 oz (71.8 kg)  09/16/22 156 lb 12.8 oz  (71.1 kg)    Physical Exam Constitutional:      Appearance: Normal appearance.  HENT:     Nose:     Comments: Dried blood in both nostrils No sinus tenderness No jugular lymphadenopathy Cardiovascular:     Rate and Rhythm: Normal rate and regular rhythm.     Heart sounds: No murmur heard. Pulmonary:  Effort: Pulmonary effort is normal. No respiratory distress.     Breath sounds: Normal breath sounds. No wheezing.  Abdominal:     General: Bowel sounds are normal. There is no distension.     Tenderness: There is no abdominal tenderness. There is no guarding or rebound.  Musculoskeletal:        General: No swelling or tenderness.  Skin:    General: Skin is dry.  Neurological:     Mental Status: She is alert. Mental status is at baseline.     Sensory: No sensory deficit.     Motor: No weakness.      Labs reviewed: Basic Metabolic Panel: Recent Labs    02/18/22 1235 06/15/22 1706 07/21/22 0522 08/03/22 0729 09/16/22 1510  NA  --  141 140  --  139  K  --  3.9 3.7  --  4.0  CL  --  108 107  --  106  CO2  --  26 23  --  26  GLUCOSE  --  102* 117*  --  119*  BUN  --  19 17  --  14  CREATININE  --  0.81 0.69  --  0.65  CALCIUM  --  9.1 9.3  --  9.3  TSH 0.168*  --   --  0.17*  --    Liver Function Tests: Recent Labs    02/18/22 1148 06/15/22 1706 07/21/22 0522  AST 17 18 18   ALT 15 17 16   ALKPHOS 69 58 67  BILITOT 0.9 0.5 0.7  PROT 7.4 6.7 6.7  ALBUMIN 4.1 3.7 3.9   Recent Labs    06/15/22 1706  LIPASE 48   No results for input(s): "AMMONIA" in the last 8760 hours. CBC: Recent Labs    02/18/22 1148 06/15/22 1706 07/21/22 0522 09/16/22 1510  WBC 6.5 7.0 6.3 9.7  NEUTROABS 3.9 4.2 3.5  --   HGB 14.3 13.6 13.7 14.3  HCT 43.6 40.6 41.4 43.9  MCV 90.5 90.0 90.4 91.1  PLT 263 243 243 247   Lipid Panel: No results for input(s): "CHOL", "HDL", "LDLCALC", "TRIG", "CHOLHDL", "LDLDIRECT" in the last 8760 hours. TSH: Recent Labs    02/18/22 1235  08/03/22 0729  TSH 0.168* 0.17*   A1C: Lab Results  Component Value Date   HGBA1C 6.1 (H) 06/07/2021     Assessment/Plan  1. Bleeding from the nose  No active bleeding  Dried blood seen in the nostrils Instructed patient to go to ED if bleeding recurs  Will send augmentin to pharmacy  Instructed patient to follow up with ENT  - oxymetazoline (AFRIN NASAL SPRAY) 0.05 % nasal spray; Place 1 spray into both nostrils 2 (two) times daily.  Dispense: 30 mL; Refill: 0 - amoxicillin-clavulanate (AUGMENTIN) 875-125 MG tablet; Take 1 tablet by mouth 2 (two) times daily.  Dispense: 20 tablet; Refill: 0  2. Malignant melanoma of nasal cavity (HCC)  Follow up with ENT   No follow-ups on file.:  2 MONTHS  I spent greater than  35 minutes for the care of this patient in face to face time, chart review, clinical documentation, patient education.

## 2022-11-30 NOTE — Patient Instructions (Signed)
Please go to emergency room if bleeding recurs Call you ENT to schedule appt with them

## 2022-12-01 ENCOUNTER — Encounter: Payer: Self-pay | Admitting: Sports Medicine

## 2022-12-01 ENCOUNTER — Telehealth: Payer: Self-pay | Admitting: Orthopaedic Surgery

## 2022-12-01 ENCOUNTER — Telehealth (HOSPITAL_BASED_OUTPATIENT_CLINIC_OR_DEPARTMENT_OTHER): Payer: Self-pay | Admitting: Orthopaedic Surgery

## 2022-12-01 NOTE — Telephone Encounter (Signed)
Referral has been faxed.

## 2022-12-01 NOTE — Telephone Encounter (Signed)
Changed.

## 2022-12-01 NOTE — Telephone Encounter (Signed)
Referral is being updated by [place].

## 2022-12-01 NOTE — Telephone Encounter (Signed)
Pt called requesting from last note. Called Terri ad Terri routed MRI Referral to [facility] per pt last note

## 2022-12-01 NOTE — Telephone Encounter (Signed)
Patient wants her referel sent to kim at [facility] out patient imaging [phone] P) [phone](M)

## 2022-12-06 ENCOUNTER — Encounter: Payer: Self-pay | Admitting: Orthopaedic Surgery

## 2022-12-07 ENCOUNTER — Encounter: Payer: Self-pay | Admitting: Orthopaedic Surgery

## 2022-12-07 ENCOUNTER — Ambulatory Visit: Payer: Medicare HMO | Admitting: Orthopaedic Surgery

## 2022-12-07 VITALS — BP 150/76 | HR 59 | Ht 63.0 in | Wt 155.0 lb

## 2022-12-07 DIAGNOSIS — M23203 Derangement of unspecified medial meniscus due to old tear or injury, right knee: Secondary | ICD-10-CM

## 2022-12-07 NOTE — Progress Notes (Unsigned)
Office Visit Note   Patient: Darlene [name]           Date of Birth: [birth year]           MRN: [id number] Visit Date: 12/07/2022              Requested by: [name], [name], NP [address] [place],  [place] [address] PCP: [name], [name], NP   Assessment & Plan: Visit Diagnoses: No diagnosis found.  Plan: ***  Follow-Up Instructions: No follow-ups on file.   Orders:  No orders of the defined types were placed in this encounter.  No orders of the defined types were placed in this encounter.     Procedures: No procedures performed   Clinical Data: No additional findings.   Subjective: Chief Complaint  Patient presents with   Right Knee - Pain    MRI review    HPI  Review of Systems   Objective: Vital Signs: BP (!) 150/76   Pulse (!) 59   Ht 5\' 3"  (1.6 m)   Wt 155 lb (70.3 kg)   BMI 27.46 kg/m   Physical Exam  Ortho Exam  Specialty Comments:  No specialty comments available.  Imaging: No results found.   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) 08/07/2022   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   Osteoarthritis, multiple sites 11/05/2021   Anxiety 10/29/2021   SCC (squamous cell carcinoma) 09/10/2021   Right-sided epistaxis 08/27/2021   HOH (hard of hearing) 08/20/2021   Sepsis (HCC) 06/06/2021   Lactose intolerance 05/14/2021   Osteopenia after menopause 01/22/2021   OSA (obstructive sleep apnea) 11/27/2020   Postmenopause 11/27/2020   Abnormal barium swallow 10/30/2020   Precordial pain    Other spondylosis with radiculopathy, cervical region 02/19/2020   Prediabetes 01/09/2020   Adverse reaction to COVID-19 vaccine 11/29/2019   Chronic cough 11/24/2019   Melanoma of skin (HCC) 08/24/2019   Palpitations 06/07/2019   Schatzki's ring  05/12/2019   Hiatal hernia 05/12/2019   Insomnia 03/29/2019   Back pain with left-sided sciatica 03/01/2019   Daytime sleepiness 11/30/2018   History of colonic polyps 09/03/2018   Dysphagia 09/03/2018   Pyrosis 09/03/2018   Chronic sore throat 09/03/2018   Actinic keratoses 02/23/2018   Seasonal and perennial allergic rhinitis 01/31/2018   Infected surgical wound 12/01/2017   Incontinent of urine 12/01/2017   Essential hypertension 09/06/2017   History of stroke 09/06/2017   Hyperlipidemia 09/06/2017   GAD (generalized anxiety disorder) 09/06/2017   Acquired hypothyroidism 09/06/2017   Chronic sinusitis 09/06/2017   Gastroesophageal reflux disease 09/06/2017   Past Medical History:  Diagnosis Date   Anxiety    Atypical mole 11/11/2020   Left Inner Knee (moderate-free)   Colon polyps    Depression    Hyperlipidemia    Hypertension    Hypothyroidism    Melanoma in situ (HCC) 09/19/2018   Right Arm (excision)   Skin cancer    Sleep apnea    Stroke [facility]) 2008    Family History  Problem Relation Age of Onset   Lung cancer Mother 81       smoker   Heart disease Mother    Stroke Father 3   Heart attack Father    Allergic rhinitis Sister    Hypertension  Sister    CVA Brother    Lung cancer Maternal Grandfather    Heart attack Paternal Grandfather    Melanoma Daughter    Psoriasis Daughter    Rheum arthritis Daughter    Bipolar disorder Daughter    Colon cancer Neg Hx    Esophageal cancer Neg Hx    Inflammatory bowel disease Neg Hx    Liver disease Neg Hx    Pancreatic cancer Neg Hx    Stomach cancer Neg Hx    Rectal cancer Neg Hx     Past Surgical History:  Procedure Laterality Date   ADENOIDECTOMY     APPENDECTOMY  1960   CATARACT EXTRACTION Bilateral    COLONOSCOPY     GUM SURGERY  03/2019   LUMBAR DISC SURGERY  2014   MELANOMA EXCISION  2020   MESH APPLIED TO LAP PORT     SINUS EXPLORATION     11/17/2021 operation for right nasal cavity and  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   TONSILLECTOMY  1940   Social History   Occupational History   Occupation: retired Chemical engineer  Tobacco Use   Smoking status: Never   Smokeless tobacco: Never  Vaping Use   Vaping status: Never Used  Substance and Sexual Activity   Alcohol use: Not Currently    Comment: hardly ever   Drug use: Never   Sexual activity: Not Currently

## 2022-12-08 ENCOUNTER — Encounter: Payer: Self-pay | Admitting: Orthopaedic Surgery

## 2022-12-08 DIAGNOSIS — S83206A Unspecified tear of unspecified meniscus, current injury, right knee, initial encounter: Secondary | ICD-10-CM | POA: Insufficient documentation

## 2022-12-09 ENCOUNTER — Non-Acute Institutional Stay: Payer: Medicare HMO | Admitting: Nurse Practitioner

## 2022-12-09 ENCOUNTER — Other Ambulatory Visit: Payer: Medicare HMO

## 2022-12-09 ENCOUNTER — Telehealth: Payer: Self-pay | Admitting: Orthopaedic Surgery

## 2022-12-09 ENCOUNTER — Encounter: Payer: Self-pay | Admitting: Nurse Practitioner

## 2022-12-09 VITALS — BP 126/76 | HR 69 | Temp 97.6°F | Resp 18 | Ht 63.0 in | Wt 158.6 lb

## 2022-12-09 DIAGNOSIS — Z8673 Personal history of transient ischemic attack (TIA), and cerebral infarction without residual deficits: Secondary | ICD-10-CM | POA: Diagnosis not present

## 2022-12-09 DIAGNOSIS — F419 Anxiety disorder, unspecified: Secondary | ICD-10-CM | POA: Diagnosis not present

## 2022-12-09 DIAGNOSIS — R32 Unspecified urinary incontinence: Secondary | ICD-10-CM

## 2022-12-09 DIAGNOSIS — E782 Mixed hyperlipidemia: Secondary | ICD-10-CM

## 2022-12-09 DIAGNOSIS — M8580 Other specified disorders of bone density and structure, unspecified site: Secondary | ICD-10-CM

## 2022-12-09 DIAGNOSIS — E039 Hypothyroidism, unspecified: Secondary | ICD-10-CM | POA: Diagnosis not present

## 2022-12-09 DIAGNOSIS — Z780 Asymptomatic menopausal state: Secondary | ICD-10-CM

## 2022-12-09 DIAGNOSIS — C300 Malignant neoplasm of nasal cavity: Secondary | ICD-10-CM | POA: Diagnosis not present

## 2022-12-09 DIAGNOSIS — M150 Primary generalized (osteo)arthritis: Secondary | ICD-10-CM

## 2022-12-09 DIAGNOSIS — I10 Essential (primary) hypertension: Secondary | ICD-10-CM

## 2022-12-09 DIAGNOSIS — K449 Diaphragmatic hernia without obstruction or gangrene: Secondary | ICD-10-CM

## 2022-12-09 NOTE — Assessment & Plan Note (Addendum)
saw Ortho. Cervical spinal stenosis, MR 02/26/20, C6-C7 Pending R mensicus tender surgery.

## 2022-12-09 NOTE — Assessment & Plan Note (Signed)
blood pressure is controlled on Losartan qd. added Imdur 30mg  tid by Cardiology. Followed by Cardiology. CTA negative 09/01/20. Bun/creat 17/0.69 07/21/22

## 2022-12-09 NOTE — Progress Notes (Signed)
Location:   clinic [facility]   Place of Service:  Clinic (12) Provider: Chipper Oman NP  Code Status: DNR Goals of Care:     12/09/2022    3:31 PM  Advanced Directives  Does Patient Have a Medical Advance Directive? Yes  Type of Advance Directive Healthcare Power of Attorney  Does patient want to make changes to medical advance directive? No - Patient declined  Copy of Healthcare Power of Attorney in Chart? Yes - validated most recent copy scanned in chart (See row information)     No chief complaint on file.   HPI: Patient is a 89 y.o. female seen today for medical management of chronic diseases.    Resolved pain allover after immunotherapy infusion,, cancer center recommended Tylenol up to 3000mg  a day for pain.                          After 2nd Shingrix, feels aches, headache, more nasal/ear congestion.   07/21/22 ED eval, dehydration, gum swelling             Malignant melanoma of nasal cavity, followed by oncology, ENT, dental team, on immunotherapy             Anxiety, chronic, escalated since  recurrent melanoma in nose              Sleeps too much during day, failed dc Ambien, off Valium, refused CPAP, feels a veil in front of left eye, more noticeable left facial weakness Hx of chronic rhinitis/sphenoidal sinusitis, s/p surgery, improved SOB, wheezing, but still has nasal/right ear congestion, on Astelin, Flonase, Bronchodilator, underwent Pulmonology, ENT, GI, Allergy evaluation, had spirometry 10/20/20, Echo 60-65% EF 04/16/20, CTA 09/01/20. Saw ENT Incidental finding of melanoma in nose, follows oncology             Chronic sore throat: multiple factorials, GERD vs chronic rhinitis/sinusitis,  ENT  Insomnia Ambien             Hx of CVA, on plavix. Statin,  residual of  poor left peripheral vision  Hypothyroidism, on Levothyroxine-declined dose adjustment.  TSH 0.168 02/18/22, 0.17 08/03/22             HTN, blood pressure is controlled on Losartan qd. added Imdur 30mg  tid by  Cardiology. Followed by Cardiology. CTA negative 09/01/20. Bun/creat 17/0.69 07/21/22            GERD, stable Omeprazole, FDgard helped,  saw GI, EGD 12/26/19. Vit B12 383 12/02/20, Hgb 13.7 07/21/22. GI 09/03/21, pending repeat endoscope.              Lactose intolerance, diet adjustment, f/u GI             Esophageal dysmotility, Swallow studay 05/12/20, worked with ST, GI f/u             OSA sleep study, saw Pulmonology.  Not using CPAP                                     OA, saw Ortho. Cervical spinal stenosis, MR 02/26/20, C6-C7. Pending R mensicus tender surgery.              Hyperlipidemia, takes Pravastatin, LDL 94 05/12/21             Urinary frequency/leakage, 2-3x/night, stopped taking Gemtesa, acupuncture improved initially. Saw urology.  Osteopenia, DEXA 12/25/20, t score -1.0,  10 years possibility major fx 11%, hip fx 2.6%. Vit D, Cal-diet rich,  Vit D 3 39 12/26/20             HOH audiology eval, ENT placed tube in the right ear-failed-again, hearing aids       Past Medical History:  Diagnosis Date   Anxiety    Atypical mole 11/11/2020   Left Inner Knee (moderate-free)   Colon polyps    Depression    Hyperlipidemia    Hypertension    Hypothyroidism    Melanoma in situ (HCC) 09/19/2018   Right Arm (excision)   Skin cancer    Sleep apnea    Stroke [facility]) 2008    Past Surgical History:  Procedure Laterality Date   ADENOIDECTOMY     APPENDECTOMY  1960   CATARACT EXTRACTION Bilateral    COLONOSCOPY     GUM SURGERY  03/2019   LUMBAR DISC SURGERY  2014   MELANOMA EXCISION  2020   MESH APPLIED TO LAP PORT     SINUS EXPLORATION     11/17/2021 operation for right nasal cavity and 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   TONSILLECTOMY  1940    Allergies  Allergen Reactions   Lactose Intolerance (Gi) Other (See Comments)    indigestion   Norco [Hydrocodone-Acetaminophen] Nausea And Vomiting   Dust Mite Extract Other (See Comments)     Sinus drainage   Oxycontin [Oxycodone Hcl] Nausea And Vomiting   Pollen Extract Other (See Comments)    Sinus drainage    Allergies as of 12/09/2022       Reactions   Lactose Intolerance (gi) Other (See Comments)   indigestion   Norco [hydrocodone-acetaminophen] Nausea And Vomiting   Dust Mite Extract Other (See Comments)   Sinus drainage   Oxycontin [oxycodone Hcl] Nausea And Vomiting   Pollen Extract Other (See Comments)   Sinus drainage        Medication List        Accurate as of December 09, 2022 11:59 PM. If you have any questions, ask your nurse or doctor.          acetaminophen 325 MG tablet Commonly known as: TYLENOL Take 650 mg by mouth every 6 (six) hours as needed for headache (pain).   acetaminophen 500 MG tablet Commonly known as: TYLENOL Take 1,000 mg by mouth every 8 (eight) hours.   amoxicillin-clavulanate 875-125 MG tablet Commonly known as: AUGMENTIN Take 1 tablet by mouth 2 (two) times daily.   antiseptic oral rinse Liqd 15 mLs by Mouth Rinse route as needed for dry mouth or mouth pain.   calcium carbonate 500 MG chewable tablet Commonly known as: TUMS - dosed in mg elemental calcium Chew 1 tablet by mouth as needed for indigestion or heartburn.   clopidogrel 75 MG tablet Commonly known as: PLAVIX TAKE 1 TABLET BY MOUTH EVERY DAY   cycloSPORINE 0.05 % ophthalmic emulsion Commonly known as: RESTASIS Place 1 drop into both eyes as needed.   ketoconazole 2 % shampoo Commonly known as: NIZORAL Apply topically 2 (two) times a week.   losartan 50 MG tablet Commonly known as: COZAAR Take 1 tablet (50 mg total) by mouth daily.   nitroGLYCERIN 0.4 MG SL tablet Commonly known as: NITROSTAT Place 1 tablet (0.4 mg total) under the tongue every 5 (five) minutes as needed for up to 25 days for chest pain.   omeprazole 40 MG capsule  Commonly known as: PRILOSEC Take 1 capsule (40 mg total) by mouth as needed.   oxymetazoline 0.05 % nasal  spray Commonly known as: Afrin Nasal Spray Place 1 spray into both nostrils 2 (two) times daily.   pravastatin 40 MG tablet Commonly known as: PRAVACHOL Take 1 tablet (40 mg total) by mouth daily.   Synthroid 100 MCG tablet Generic drug: levothyroxine TAKE 1 TABLET BY MOUTH EVERY DAY BEFORE BREAKFAST   Systane 0.4-0.3 % Gel ophthalmic gel Generic drug: Polyethyl Glycol-Propyl Glycol Place 1 application. into both eyes at bedtime.   Zolpidem Tartrate 3.5 MG Subl TAKE 1 TABLET BY MOUTH EVERY DAY AT BEDTIME AS NEEDED        Review of Systems:  Review of Systems  Constitutional:  Positive for fatigue. Negative for appetite change and fever.  HENT:  Positive for congestion and hearing loss. Negative for nosebleeds, sinus pressure, sore throat and voice change.        Chronic sinusitis, underwent ENT, sleep study-sleep apnea. Gum irritation  Eyes:  Positive for visual disturbance.       Left lateral visual field deficit. Dry eyes  Respiratory:  Negative for cough and shortness of breath.   Cardiovascular:  Negative for leg swelling.  Gastrointestinal:  Negative for abdominal pain and constipation.  Genitourinary:  Positive for frequency. Negative for dysuria and urgency.       Urinary leakage, urinary frequency 2-3x/night. Doing Kegel exercise.   Musculoskeletal:  Positive for arthralgias. Negative for back pain and gait problem.       Aches allover  Skin:  Negative for color change.       Mohs dorsum right hand, healed.   Neurological:  Negative for speech difficulty, weakness and headaches.  Psychiatric/Behavioral:  Positive for sleep disturbance. Negative for dysphoric mood. The patient is not nervous/anxious.        Early am awake, difficulty returning asleep.     Health Maintenance  Topic Date Due   Colonoscopy  10/25/2021   Medicare Annual Wellness (AWV)  03/05/2022   INFLUENZA VACCINE  08/19/2022   COVID-19 Vaccine (4 - 2023-24 season) 09/19/2022   Pneumonia Vaccine  40+ Years old  Completed   DEXA SCAN  Completed   Zoster Vaccines- Shingrix  Completed   HPV VACCINES  Aged Out   DTaP/Tdap/Td  Discontinued    Physical Exam: Vitals:   12/09/22 1540  BP: 126/76  Pulse: 69  Resp: 18  Temp: 97.6 F (36.4 C)  SpO2: 95%  Weight: 158 lb 9.6 oz (71.9 kg)  Height: 5\' 3"  (1.6 m)   Body mass index is 28.09 kg/m. Physical Exam Vitals and nursing note reviewed.  Constitutional:      Appearance: Normal appearance.  HENT:     Head: Normocephalic and atraumatic.     Ears:     Comments: Right ear tube placed, mild erythema, no bleeding.     Nose: Nose normal.     Comments: Redness right nostril, no bleed.     Mouth/Throat:     Mouth: Mucous membranes are moist.     Pharynx: No oropharyngeal exudate or posterior oropharyngeal erythema.  Eyes:     Extraocular Movements: Extraocular movements intact.     Conjunctiva/sclera: Conjunctivae normal.     Pupils: Pupils are equal, round, and reactive to light.     Comments: Left peripheral visual field deficit since CVA 2006  Cardiovascular:     Rate and Rhythm: Normal rate and regular rhythm.  Heart sounds: No murmur heard. Pulmonary:     Effort: Pulmonary effort is normal.     Breath sounds: Rales present.     Comments: Bibasilar rales.  Abdominal:     General: Bowel sounds are normal.     Palpations: Abdomen is soft.     Tenderness: There is no abdominal tenderness.  Musculoskeletal:        General: Tenderness present.     Cervical back: Normal range of motion and neck supple.     Right lower leg: No edema.     Left lower leg: No edema.     Comments: Aches allover, R knee pain when standing, no s/s of infection or injury.   Skin:    General: Skin is warm and dry.  Neurological:     General: No focal deficit present.     Mental Status: She is alert and oriented to person, place, and time. Mental status is at baseline.     Motor: Weakness present.     Gait: Gait normal.     Comments: Left  facial weakness  Psychiatric:        Mood and Affect: Mood normal.        Behavior: Behavior normal.        Thought Content: Thought content normal.        Judgment: Judgment normal.     Labs reviewed: Basic Metabolic Panel: Recent Labs    02/18/22 1235 06/15/22 1706 07/21/22 0522 08/03/22 0729 09/16/22 1510  NA  --  141 140  --  139  K  --  3.9 3.7  --  4.0  CL  --  108 107  --  106  CO2  --  26 23  --  26  GLUCOSE  --  102* 117*  --  119*  BUN  --  19 17  --  14  CREATININE  --  0.81 0.69  --  0.65  CALCIUM  --  9.1 9.3  --  9.3  TSH 0.168*  --   --  0.17*  --    Liver Function Tests: Recent Labs    02/18/22 1148 06/15/22 1706 07/21/22 0522  AST 17 18 18   ALT 15 17 16   ALKPHOS 69 58 67  BILITOT 0.9 0.5 0.7  PROT 7.4 6.7 6.7  ALBUMIN 4.1 3.7 3.9   Recent Labs    06/15/22 1706  LIPASE 48   No results for input(s): "AMMONIA" in the last 8760 hours. CBC: Recent Labs    02/18/22 1148 06/15/22 1706 07/21/22 0522 09/16/22 1510  WBC 6.5 7.0 6.3 9.7  NEUTROABS 3.9 4.2 3.5  --   HGB 14.3 13.6 13.7 14.3  HCT 43.6 40.6 41.4 43.9  MCV 90.5 90.0 90.4 91.1  PLT 263 243 243 247   Lipid Panel: No results for input(s): "CHOL", "HDL", "LDLCALC", "TRIG", "CHOLHDL", "LDLDIRECT" in the last 8760 hours. Lab Results  Component Value Date   HGBA1C 6.1 (H) 06/07/2021    Procedures since last visit: No results found.  Assessment/Plan  Malignant melanoma of nasal cavity (HCC) Malignant melanoma of nasal cavity, followed by oncology, ENT, dental team, on immunotherapy  Anxiety  chronic, escalated since  recurrent melanoma in nose   History of stroke Hx of CVA, on plavix. Statin,  residual of  poor left peripheral vision   Acquired hypothyroidism  on Levothyroxine-declined dose adjustment.  TSH 0.168 02/18/22, 0.17 08/03/22  Essential hypertension blood pressure is controlled on Losartan qd. added Imdur  30mg  tid by Cardiology. Followed by Cardiology. CTA  negative 09/01/20. Bun/creat 17/0.69 07/21/22  Hiatal hernia stable Omeprazole, FDgard helped,  saw GI, EGD 12/26/19. Vit B12 383 12/02/20, Hgb 13.7 07/21/22. GI 09/03/21, pending repeat endoscope.   Osteoarthritis, multiple sites saw Ortho. Cervical spinal stenosis, MR 02/26/20, C6-C7 Pending R mensicus tender surgery.   Hyperlipidemia  takes Pravastatin, LDL 94 05/12/21  Incontinent of urine Urinary frequency/leakage, 2-3x/night, stopped taking Gemtesa, acupuncture improved initially. Saw urology.   Osteopenia after menopause DEXA 12/25/20, t score -1.0,  10 years possibility major fx 11%, hip fx 2.6%. Vit D, Cal-diet rich,  Vit D 3 39 12/26/20   Labs/tests ordered:  none Next appt:  prn

## 2022-12-09 NOTE — Assessment & Plan Note (Signed)
DEXA 12/25/20, t score -1.0,  10 years possibility major fx 11%, hip fx 2.6%. Vit D, Cal-diet rich,  Vit D 3 39 12/26/20

## 2022-12-09 NOTE — Assessment & Plan Note (Signed)
chronic, escalated since  recurrent melanoma in nose

## 2022-12-09 NOTE — Telephone Encounter (Signed)
Dr. Ophelia Charter advised.

## 2022-12-09 NOTE — Assessment & Plan Note (Signed)
on Levothyroxine-declined dose adjustment.  TSH 0.168 02/18/22, 0.17 08/03/22

## 2022-12-09 NOTE — Assessment & Plan Note (Signed)
Hx of CVA, on plavix. Statin,  residual of  poor left peripheral vision  

## 2022-12-09 NOTE — Assessment & Plan Note (Signed)
stable Omeprazole, FDgard helped,  saw GI, EGD 12/26/19. Vit B12 383 12/02/20, Hgb 13.7 07/21/22. GI 09/03/21, pending repeat endoscope.

## 2022-12-09 NOTE — Assessment & Plan Note (Signed)
Urinary frequency/leakage, 2-3x/night, stopped taking Gemtesa, acupuncture improved initially. Saw urology.

## 2022-12-09 NOTE — Assessment & Plan Note (Signed)
Malignant melanoma of nasal cavity, followed by oncology, ENT, dental team, on immunotherapy

## 2022-12-09 NOTE — Assessment & Plan Note (Signed)
takes Pravastatin, LDL 94 05/12/21 

## 2022-12-09 NOTE — Telephone Encounter (Signed)
Patient called and said she is in a lot of pain. She doesn't know what to do. CB#[phone]

## 2022-12-13 ENCOUNTER — Telehealth: Payer: Self-pay | Admitting: Orthopaedic Surgery

## 2022-12-13 ENCOUNTER — Encounter: Payer: Self-pay | Admitting: Nurse Practitioner

## 2022-12-13 ENCOUNTER — Telehealth: Payer: Self-pay

## 2022-12-13 NOTE — Telephone Encounter (Signed)
Patient called and left VM on Triage line. States she does not know what to do with all the pain she is having. Would like a CB to discuss.   CB [phone]

## 2022-12-13 NOTE — Telephone Encounter (Signed)
Pt came in today and stated she is in "severe pain" although patient comes in multiple times a week claiming she does not like to call it is easier for her to come in. Pt states yates did not answer her questions when he called her she could not speak to him I advised her that he called her personally this morning but she did not answer I told her she has to make sure she answers the phone so she don't have to come up here to ask questions. She said she missed the call because she was looking at buying a scooter because of the amount of pain she is in, and that she would need it for after the surgery. Pt stated she is leave to [facility] the day after Thanksgiving with her daughter and does not want the surgery before 01/02/23. She is requesting a call from [facility] today, even though we advised he is not in office today

## 2022-12-13 NOTE — Telephone Encounter (Signed)
noted 

## 2022-12-16 ENCOUNTER — Other Ambulatory Visit: Payer: Self-pay

## 2022-12-21 ENCOUNTER — Other Ambulatory Visit: Payer: Self-pay

## 2022-12-21 MED ORDER — LOSARTAN POTASSIUM 50 MG PO TABS: 50.0000 mg | ORAL_TABLET | Freq: Every day | ORAL | 3 refills | Status: DC

## 2022-12-21 NOTE — Telephone Encounter (Signed)
Surgery scheduled for 01/03/23.  I called patient with details.

## 2022-12-21 NOTE — Telephone Encounter (Signed)
Patient called requesting a refill on Losartan 50 mg and medication never filled by [facility]

## 2022-12-24 ENCOUNTER — Other Ambulatory Visit: Payer: Self-pay | Admitting: *Deleted

## 2022-12-24 ENCOUNTER — Other Ambulatory Visit: Payer: Self-pay | Admitting: Physician Assistant

## 2022-12-24 DIAGNOSIS — E7800 Pure hypercholesterolemia, unspecified: Secondary | ICD-10-CM

## 2022-12-24 MED ORDER — PRAVASTATIN SODIUM 40 MG PO TABS: 40.0000 mg | ORAL_TABLET | Freq: Every day | ORAL | 0 refills | Status: DC

## 2022-12-27 ENCOUNTER — Other Ambulatory Visit: Payer: Self-pay

## 2022-12-27 ENCOUNTER — Encounter (HOSPITAL_BASED_OUTPATIENT_CLINIC_OR_DEPARTMENT_OTHER): Payer: Self-pay | Admitting: Orthopaedic Surgery

## 2022-12-27 ENCOUNTER — Encounter (HOSPITAL_COMMUNITY): Payer: Self-pay | Admitting: Anesthesiology

## 2022-12-27 NOTE — Progress Notes (Signed)
   12/27/22 1002  Pre-op Phone Call  Surgery Date Verified 01/03/23  Arrival Time Verified 0800  Surgery Location Verified [facility]  Medical History Reviewed Yes  Is the patient taking a GLP-1 receptor agonist? No  Does the patient have diabetes? No diagnosis of diabetes  Do you have a history of heart problems? Yes  Cardiologist Name Dr. Yates Decamp  Have you ever had tests on your heart? Yes  What cardiac tests were performed? Echo;EKG;Labs;Stress Test  Results viewable: [facility] Media Tab  Does patient have other implanted devices? No  Patient Teaching Enhanced Recovery;Pre / Post Procedure  Patient educated about smoking cessation 24 hours prior to surgery. N/A Non-Smoker  Patient verbalizes understanding of bowel prep? N/A  THA/TKA patients only:  By your surgery date, will you have been taking narcotics for 90 days or greater? No  Med Rec Completed Yes  Take the Following Meds the Morning of Surgery synthyriod  Recent  Lab Work, EKG, CXR? Yes  NPO (Including gum & candy) After midnight  Allowed clear liquids Water;Gatorade  (diabetics please choose diet or no sugar options)  Patient instructed to stop clear liquids including Carb loading drink at: 0800  Stop Solids, Milk, Candy, and Gum STARTING AT MIDNIGHT  Did patient view EMMI videos? No  Responsible adult to drive and be with you for 24 hours? Yes  Name & Phone Number for Ride/Caregiver friend  debbie  No Jewelry, money, nail polish or make-up.  No lotions, powders, perfumes. No shaving  48 hrs. prior to surgery. Yes  Contacts, Dentures & Glasses Will Have to be Removed Before OR. Yes  Please bring your ID and Insurance Card the morning of your surgery. (Surgery Centers Only) Yes  Bring any papers or x-rays with you that your surgeon gave you. Yes  Instructed to contact the location of procedure/ provider if they or anyone in their household develops symptoms or tests positive for COVID-19, has close contact with someone who tests  positive for COVID, or has known exposure to any contagious illness. Yes  Call this number the morning of surgery  with any problems that may cancel your surgery. [phone]  Covid-19 Assessment  Have you had a positive COVID-19 test within the previous 90 days? No  COVID Testing Guidance Proceed with the additional questions.  Patient's surgery required a COVID-19 test (cardiothoracic, complex ENT, and bronchoscopies/ EBUS) No  Have you been unmasked and in close contact with anyone with COVID-19 or COVID-19 symptoms within the past 10 days? No  Do you or anyone in your household currently have any COVID-19 symptoms? No   Reviewed with Dr. Salvadore Farber and okay for [facility]

## 2022-12-29 NOTE — Telephone Encounter (Signed)
Patient called returning your call about setting up PT. She says she is at [facility] but does not want to have the therapy done there and wants to discuss with you where she should go. She requested a call back in the morning to discuss.

## 2022-12-30 ENCOUNTER — Telehealth: Payer: Self-pay | Admitting: Radiology

## 2022-12-30 ENCOUNTER — Encounter: Payer: Self-pay | Admitting: Nurse Practitioner

## 2022-12-30 ENCOUNTER — Non-Acute Institutional Stay: Payer: Medicare HMO | Admitting: Nurse Practitioner

## 2022-12-30 VITALS — BP 132/84 | HR 91 | Temp 97.5°F | Resp 17 | Ht 63.0 in | Wt 159.8 lb

## 2022-12-30 DIAGNOSIS — E782 Mixed hyperlipidemia: Secondary | ICD-10-CM

## 2022-12-30 DIAGNOSIS — Z780 Asymptomatic menopausal state: Secondary | ICD-10-CM

## 2022-12-30 DIAGNOSIS — Z8673 Personal history of transient ischemic attack (TIA), and cerebral infarction without residual deficits: Secondary | ICD-10-CM

## 2022-12-30 DIAGNOSIS — F5101 Primary insomnia: Secondary | ICD-10-CM

## 2022-12-30 DIAGNOSIS — M23203 Derangement of unspecified medial meniscus due to old tear or injury, right knee: Secondary | ICD-10-CM

## 2022-12-30 DIAGNOSIS — C434 Malignant melanoma of scalp and neck: Secondary | ICD-10-CM

## 2022-12-30 DIAGNOSIS — I10 Essential (primary) hypertension: Secondary | ICD-10-CM

## 2022-12-30 DIAGNOSIS — M8580 Other specified disorders of bone density and structure, unspecified site: Secondary | ICD-10-CM | POA: Diagnosis not present

## 2022-12-30 DIAGNOSIS — K219 Gastro-esophageal reflux disease without esophagitis: Secondary | ICD-10-CM

## 2022-12-30 DIAGNOSIS — R32 Unspecified urinary incontinence: Secondary | ICD-10-CM

## 2022-12-30 DIAGNOSIS — M150 Primary generalized (osteo)arthritis: Secondary | ICD-10-CM

## 2022-12-30 DIAGNOSIS — H9190 Unspecified hearing loss, unspecified ear: Secondary | ICD-10-CM

## 2022-12-30 DIAGNOSIS — E039 Hypothyroidism, unspecified: Secondary | ICD-10-CM

## 2022-12-30 NOTE — Telephone Encounter (Signed)
I spoke with patient about needing to attend PT prior to surgery per surgery denial.  She states that she had to buy a battery operated scooter to sit on and ride to get around as she is having an extremely difficult time walking. She would like to know if you could possibly submit documentation that this was needed so that she could get some reimbursement from insurance? She paid $1300 out of pocket for it.   I explained you are out of the office and I would have to send a message to be addressed next week.

## 2022-12-30 NOTE — Assessment & Plan Note (Signed)
on plavix. Statin,  residual of  poor left peripheral vision

## 2022-12-30 NOTE — Progress Notes (Signed)
Location:   Clinic [facility]   Place of Service:  Clinic (12) Provider: Chipper Oman NP  Code Status: DNR Goals of Care:     12/30/2022    1:00 PM  Advanced Directives  Does Patient Have a Medical Advance Directive? Yes  Type of Estate agent of [place];Living will  Does patient want to make changes to medical advance directive? No - Patient declined  Copy of Healthcare Power of Attorney in Chart? No - copy requested     Chief Complaint  Patient presents with   Acute Visit    Follow up after surgery.    HPI: Patient is a 89 y.o. female seen today for medical management of chronic diseases.    Resolved pain allover after immunotherapy infusion,, cancer center recommended Tylenol up to 3000mg  a day for pain.                          After 2nd Shingrix, feels aches, headache, more nasal/ear congestion.    07/21/22 ED eval, dehydration, gum swelling             Malignant melanoma of nasal cavity, followed by oncology, ENT, dental team, on immunotherapy             Anxiety, chronic, escalated since  recurrent melanoma in nose              Sleeps too much during day, failed dc Ambien, off Valium, refused CPAP, feels a veil in front of left eye, more noticeable left facial weakness Hx of chronic rhinitis/sphenoidal sinusitis, s/p surgery, improved SOB, wheezing, but still has nasal/right ear congestion, on Astelin, Flonase, Bronchodilator, underwent Pulmonology, ENT, GI, Allergy evaluation, had spirometry 10/20/20, Echo 60-65% EF 04/16/20, CTA 09/01/20. Saw ENT Incidental finding of melanoma in nose, follows oncology             Chronic sore throat: multiple factorials, GERD vs chronic rhinitis/sinusitis,  ENT  Insomnia Ambien             Hx of CVA, on plavix. Statin,  residual of  poor left peripheral vision  Hypothyroidism, on Levothyroxine-declined dose adjustment.  TSH 0.168 02/18/22, 0.17 08/03/22             HTN, blood pressure is controlled on Losartan qd. added  Imdur 30mg  tid by Cardiology. Followed by Cardiology. CTA negative 09/01/20. Bun/creat 17/0.69 07/21/22            GERD, stable Omeprazole, FDgard helped,  saw GI, EGD 12/26/19. Vit B12 383 12/02/20, Hgb 13.7 07/21/22. GI 09/03/21, pending repeat endoscope.              Lactose intolerance, diet adjustment, f/u GI             Esophageal dysmotility, Swallow studay 05/12/20, worked with ST, GI f/u             OSA sleep study, saw Pulmonology.  Not using CPAP                                     OA, saw Ortho. Cervical spinal stenosis, MR 02/26/20, C6-C7. Pending R mensicus tender surgery, Tramadol, Advil.              Hyperlipidemia, takes Pravastatin, LDL 94 05/12/21             Urinary frequency/leakage, 2-3x/night, stopped taking Leslye Peer,  acupuncture improved initially. Saw urology.              Osteopenia, DEXA 12/25/20, t score -1.0,  10 years possibility major fx 11%, hip fx 2.6%. Vit D, Cal-diet rich,  Vit D 3 39 12/26/20             HOH audiology eval, ENT placed tube in the right ear-failed-again, hearing aids   Past Medical History:  Diagnosis Date   Anxiety    Atypical mole 11/11/2020   Left Inner Knee (moderate-free)   Colon polyps    Depression    Hyperlipidemia    Hypertension    Hypothyroidism    Melanoma in situ (HCC) 09/19/2018   Right Arm (excision)   Skin cancer    Sleep apnea    Stroke [facility]) 2008    Past Surgical History:  Procedure Laterality Date   ADENOIDECTOMY     APPENDECTOMY  1960   CATARACT EXTRACTION Bilateral    COLONOSCOPY     GUM SURGERY  03/2019   LUMBAR DISC SURGERY  2014   MELANOMA EXCISION  2020   MESH APPLIED TO LAP PORT     SINUS EXPLORATION     11/17/2021 operation for right nasal cavity and 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   TONSILLECTOMY  1940    Allergies  Allergen Reactions   Lactose Intolerance (Gi) Other (See Comments)    indigestion   Norco [Hydrocodone-Acetaminophen] Nausea And Vomiting   Dust Mite Extract  Other (See Comments)    Sinus drainage   Oxycontin [Oxycodone Hcl] Nausea And Vomiting   Pollen Extract Other (See Comments)    Sinus drainage    Allergies as of 12/30/2022       Reactions   Lactose Intolerance (gi) Other (See Comments)   indigestion   Norco [hydrocodone-acetaminophen] Nausea And Vomiting   Dust Mite Extract Other (See Comments)   Sinus drainage   Oxycontin [oxycodone Hcl] Nausea And Vomiting   Pollen Extract Other (See Comments)   Sinus drainage        Medication List        Accurate as of December 30, 2022 11:59 PM. If you have any questions, ask your nurse or doctor.          acetaminophen 325 MG tablet Commonly known as: TYLENOL Take 650 mg by mouth every 6 (six) hours as needed for headache (pain).   acetaminophen 500 MG tablet Commonly known as: TYLENOL Take 1,000 mg by mouth every 8 (eight) hours.   amoxicillin-clavulanate 875-125 MG tablet Commonly known as: AUGMENTIN Take 1 tablet by mouth 2 (two) times daily.   antiseptic oral rinse Liqd 15 mLs by Mouth Rinse route as needed for dry mouth or mouth pain.   calcium carbonate 500 MG chewable tablet Commonly known as: TUMS - dosed in mg elemental calcium Chew 1 tablet by mouth as needed for indigestion or heartburn.   clopidogrel 75 MG tablet Commonly known as: PLAVIX TAKE 1 TABLET BY MOUTH EVERY DAY   cycloSPORINE 0.05 % ophthalmic emulsion Commonly known as: RESTASIS Place 1 drop into both eyes as needed.   ibuprofen 400 MG tablet Commonly known as: ADVIL Take 400 mg by mouth every 6 (six) hours as needed for moderate pain (pain score 4-6).   ketoconazole 2 % shampoo Commonly known as: NIZORAL Apply topically 2 (two) times a week.   losartan 50 MG tablet Commonly known as: COZAAR Take 1 tablet (50 mg total) by mouth  daily.   nitroGLYCERIN 0.4 MG SL tablet Commonly known as: NITROSTAT Place 1 tablet (0.4 mg total) under the tongue every 5 (five) minutes as needed for up  to 25 days for chest pain.   omeprazole 40 MG capsule Commonly known as: PRILOSEC Take 1 capsule (40 mg total) by mouth as needed.   oxymetazoline 0.05 % nasal spray Commonly known as: Afrin Nasal Spray Place 1 spray into both nostrils 2 (two) times daily.   pravastatin 40 MG tablet Commonly known as: PRAVACHOL Take 1 tablet (40 mg total) by mouth daily.   Synthroid 100 MCG tablet Generic drug: levothyroxine TAKE 1 TABLET BY MOUTH EVERY DAY BEFORE BREAKFAST   Systane 0.4-0.3 % Gel ophthalmic gel Generic drug: Polyethyl Glycol-Propyl Glycol Place 1 application. into both eyes at bedtime.   Zolpidem Tartrate 3.5 MG Subl TAKE 1 TABLET BY MOUTH EVERY DAY AT BEDTIME AS NEEDED        Review of Systems:  Review of Systems  Constitutional:  Positive for fatigue. Negative for appetite change and fever.  HENT:  Positive for congestion and hearing loss. Negative for nosebleeds, sinus pressure, sore throat and voice change.        Chronic sinusitis, underwent ENT, sleep study-sleep apnea.  Eyes:  Positive for visual disturbance.       Left lateral visual field deficit. Dry eyes  Respiratory:  Negative for cough and shortness of breath.   Cardiovascular:  Negative for leg swelling.  Gastrointestinal:  Negative for abdominal pain and constipation.  Genitourinary:  Positive for frequency. Negative for dysuria and urgency.       Urinary leakage, urinary frequency 2-3x/night. Doing Kegel exercise.   Musculoskeletal:  Positive for arthralgias. Negative for back pain and gait problem.       Aches allover  Skin:  Negative for color change.       Mohs dorsum right hand, healed.   Neurological:  Negative for speech difficulty, weakness and headaches.  Psychiatric/Behavioral:  Positive for sleep disturbance. Negative for dysphoric mood. The patient is not nervous/anxious.        Early am awake, difficulty returning asleep.     Health Maintenance  Topic Date Due   Colonoscopy  10/25/2021    Medicare Annual Wellness (AWV)  03/05/2022   INFLUENZA VACCINE  08/19/2022   COVID-19 Vaccine (4 - 2024-25 season) 09/19/2022   Pneumonia Vaccine 76+ Years old  Completed   DEXA SCAN  Completed   Zoster Vaccines- Shingrix  Completed   HPV VACCINES  Aged Out   DTaP/Tdap/Td  Discontinued    Physical Exam: Vitals:   12/30/22 0918  BP: 132/84  Pulse: 91  Resp: 17  Temp: (!) 97.5 F (36.4 C)  SpO2: 98%  Weight: 159 lb 12.8 oz (72.5 kg)  Height: 5\' 3"  (1.6 m)   Body mass index is 28.31 kg/m. Physical Exam Vitals and nursing note reviewed.  Constitutional:      Appearance: Normal appearance.  HENT:     Head: Normocephalic and atraumatic.     Ears:     Comments: Right ear tube placed, mild erythema, no bleeding.     Nose: Nose normal.     Mouth/Throat:     Mouth: Mucous membranes are moist.     Pharynx: No oropharyngeal exudate or posterior oropharyngeal erythema.  Eyes:     Extraocular Movements: Extraocular movements intact.     Conjunctiva/sclera: Conjunctivae normal.     Pupils: Pupils are equal, round, and reactive to light.  Comments: Left peripheral visual field deficit since CVA 2006  Cardiovascular:     Rate and Rhythm: Normal rate and regular rhythm.     Heart sounds: No murmur heard. Pulmonary:     Effort: Pulmonary effort is normal.     Breath sounds: Rales present.     Comments: Bibasilar rales.  Abdominal:     General: Bowel sounds are normal.     Palpations: Abdomen is soft.     Tenderness: There is no abdominal tenderness.  Musculoskeletal:        General: Tenderness present.     Cervical back: Normal range of motion and neck supple.     Right lower leg: No edema.     Left lower leg: No edema.     Comments: Aches allover, R knee pain when standing, no s/s of infection or injury.   Skin:    General: Skin is warm and dry.  Neurological:     General: No focal deficit present.     Mental Status: She is alert and oriented to person, place, and  time. Mental status is at baseline.     Motor: Weakness present.     Gait: Gait normal.     Comments: Left facial weakness  Psychiatric:        Mood and Affect: Mood normal.        Behavior: Behavior normal.        Thought Content: Thought content normal.        Judgment: Judgment normal.     Labs reviewed: Basic Metabolic Panel: Recent Labs    02/18/22 1235 06/15/22 1706 07/21/22 0522 08/03/22 0729 09/16/22 1510  NA  --  141 140  --  139  K  --  3.9 3.7  --  4.0  CL  --  108 107  --  106  CO2  --  26 23  --  26  GLUCOSE  --  102* 117*  --  119*  BUN  --  19 17  --  14  CREATININE  --  0.81 0.69  --  0.65  CALCIUM  --  9.1 9.3  --  9.3  TSH 0.168*  --   --  0.17*  --    Liver Function Tests: Recent Labs    02/18/22 1148 06/15/22 1706 07/21/22 0522  AST 17 18 18   ALT 15 17 16   ALKPHOS 69 58 67  BILITOT 0.9 0.5 0.7  PROT 7.4 6.7 6.7  ALBUMIN 4.1 3.7 3.9   Recent Labs    06/15/22 1706  LIPASE 48   No results for input(s): "AMMONIA" in the last 8760 hours. CBC: Recent Labs    02/18/22 1148 06/15/22 1706 07/21/22 0522 09/16/22 1510  WBC 6.5 7.0 6.3 9.7  NEUTROABS 3.9 4.2 3.5  --   HGB 14.3 13.6 13.7 14.3  HCT 43.6 40.6 41.4 43.9  MCV 90.5 90.0 90.4 91.1  PLT 263 243 243 247   Lipid Panel: No results for input(s): "CHOL", "HDL", "LDLCALC", "TRIG", "CHOLHDL", "LDLDIRECT" in the last 8760 hours. Lab Results  Component Value Date   HGBA1C 6.1 (H) 06/07/2021    Procedures since last visit: No results found.  Assessment/Plan  Osteoarthritis, multiple sites saw Ortho. Cervical spinal stenosis, MR 02/26/20, C6-C7. S/p R mensicus tender surgery.   Hyperlipidemia  takes Pravastatin, LDL 94 05/12/21  Incontinent of urine Urinary frequency/leakage, 2-3x/night, stopped taking Gemtesa, acupuncture improved initially. Saw urology.   Osteopenia after menopause DEXA 12/25/20, t score -  1.0,  10 years possibility major fx 11%, hip fx 2.6%. Vit D, Cal-diet  rich,  Vit D 3 39 12/26/20  HOH (hard of hearing) audiology eval, ENT placed tube in the right ear-failed-again, hearing aids  Gastroesophageal reflux disease stable Omeprazole, FDgard helped,  saw GI, EGD 12/26/19. Vit B12 383 12/02/20, Hgb 13.7 07/21/22. GI 09/03/21, pending repeat endoscope.   Essential hypertension blood pressure is controlled on Losartan qd. added Imdur 30mg  tid by Cardiology. Followed by Cardiology. CTA negative 09/01/20. Bun/creat 17/0.69 07/21/22  Acquired hypothyroidism  on Levothyroxine-declined dose adjustment.  TSH 0.168 02/18/22, 0.17 08/03/22  History of stroke on plavix. 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

## 2022-12-30 NOTE — Assessment & Plan Note (Signed)
saw Ortho. Cervical spinal stenosis, MR 02/26/20, C6-C7. S/p R mensicus tender surgery.

## 2022-12-30 NOTE — Assessment & Plan Note (Signed)
takes Pravastatin, LDL 94 05/12/21 

## 2022-12-30 NOTE — Assessment & Plan Note (Signed)
DEXA 12/25/20, t score -1.0,  10 years possibility major fx 11%, hip fx 2.6%. Vit D, Cal-diet rich,  Vit D 3 39 12/26/20

## 2022-12-30 NOTE — Telephone Encounter (Signed)
I spoke with Eileen Stanford, PT at [facility], phone [phone].  She states referral for PT can be faxed to her at [phone].  Patient requests PT at [facility] where she resides.  Sabrina-could you please fax referral for me?

## 2022-12-30 NOTE — Assessment & Plan Note (Signed)
Urinary frequency/leakage, 2-3x/night, stopped taking Gemtesa, acupuncture improved initially. Saw urology.

## 2022-12-30 NOTE — Assessment & Plan Note (Signed)
blood pressure is controlled on Losartan qd. added Imdur 30mg  tid by Cardiology. Followed by Cardiology. CTA negative 09/01/20. Bun/creat 17/0.69 07/21/22

## 2022-12-30 NOTE — Assessment & Plan Note (Signed)
on Levothyroxine-declined dose adjustment.  TSH 0.168 02/18/22, 0.17 08/03/22

## 2022-12-30 NOTE — Assessment & Plan Note (Signed)
audiology eval, ENT placed tube in the right ear-failed-again, hearing aids

## 2022-12-30 NOTE — Assessment & Plan Note (Signed)
stable Omeprazole, FDgard helped,  saw GI, EGD 12/26/19. Vit B12 383 12/02/20, Hgb 13.7 07/21/22. GI 09/03/21, pending repeat endoscope.

## 2022-12-30 NOTE — Assessment & Plan Note (Signed)
Takes Ambien.

## 2022-12-30 NOTE — Assessment & Plan Note (Signed)
Malignant melanoma of nasal cavity, followed by oncology, ENT, dental team, on immunotherapy

## 2023-01-03 ENCOUNTER — Ambulatory Visit (HOSPITAL_BASED_OUTPATIENT_CLINIC_OR_DEPARTMENT_OTHER): Admission: RE | Admit: 2023-01-03 | Payer: Medicare HMO | Source: Home / Self Care | Admitting: Orthopaedic Surgery

## 2023-01-03 ENCOUNTER — Encounter: Payer: Self-pay | Admitting: Nurse Practitioner

## 2023-01-03 ENCOUNTER — Encounter (HOSPITAL_BASED_OUTPATIENT_CLINIC_OR_DEPARTMENT_OTHER): Payer: Self-pay | Admitting: Anesthesiology

## 2023-01-03 SURGERY — ARTHROSCOPY, KNEE, WITH MEDIAL MENISCECTOMY
Anesthesia: General | Site: Knee | Laterality: Right

## 2023-01-03 NOTE — Telephone Encounter (Signed)
I called patient and scheduled appointment. 

## 2023-01-04 NOTE — Telephone Encounter (Signed)
Toniann Fail faxed to [facility] on the 15th

## 2023-01-07 ENCOUNTER — Ambulatory Visit: Payer: Medicare HMO | Admitting: Orthopaedic Surgery

## 2023-01-07 VITALS — Ht 63.0 in | Wt 159.8 lb

## 2023-01-07 DIAGNOSIS — M23203 Derangement of unspecified medial meniscus due to old tear or injury, right knee: Secondary | ICD-10-CM

## 2023-01-07 NOTE — Progress Notes (Signed)
Office Visit Note   Patient: Darlene [name]           Date of Birth: [birth year]           MRN: [id number] Visit Date: 01/07/2023              Requested by: [name], [name], NP [address] [place],  [place] [address] PCP: [name], [name], NP   Assessment & Plan: Visit Diagnoses:  1. Other old tear of medial meniscus of right knee     Plan: 6 months handicap sticker given.  She is continuing therapy for 4 more weeks.  Recheck 4 weeks.  Prescription given for motorized scooter that she purchased for mobility.  Recheck 4 weeks.  MRI report plain radiographs reviewed with patient and her 2 sons today outlined treatment plan discussed.  Knee arthroscopy for meniscal tear discussed.  Variable results and unpredictability with knee arthroscopy with arthritic changes discussed in detail.  Follow-Up Instructions: No follow-ups on file.   Orders:  No orders of the defined types were placed in this encounter.  No orders of the defined types were placed in this encounter.     Procedures: No procedures performed   Clinical Data: No additional findings.   Subjective: Chief Complaint  Patient presents with   Right Knee - Follow-up    HPI 89 year old female returns she has 2 sons with her who are visiting.  She continues to have problems with her right knee had been scheduled for arthroscopy for complex medial meniscal tear that had not gotten insurance approval since 6 weeks of physical therapy was one of their requirements.  Patient had complex meniscal tear present she is doing therapy has not really noticed any improvement but continues therapy now on week 2.  Patient has not fallen.  She does have problems with melanoma which is mucosal she has been on immunotherapy which gave her problems with tiredness and fatigue.  She had problems walking and since she could not proceed with the surgery had to get a mobile scooter in order to get to the to the cafeteria and participate in her activities  get to her doctors visits concerning her cancer etc.  Review of Systems all systems updated unchanged.   Objective: Vital Signs: Ht 5\' 3"  (1.6 m)   Wt 159 lb 12.8 oz (72.5 kg)   BMI 28.31 kg/m   Physical Exam Constitutional:      Appearance: She is well-developed.  HENT:     Head: Normocephalic.     Right Ear: External ear normal.     Left Ear: External ear normal. There is no impacted cerumen.  Eyes:     Pupils: Pupils are equal, round, and reactive to light.  Neck:     Thyroid: No thyromegaly.     Trachea: No tracheal deviation.  Cardiovascular:     Rate and Rhythm: Normal rate.  Pulmonary:     Effort: Pulmonary effort is normal.  Abdominal:     Palpations: Abdomen is soft.  Musculoskeletal:     Cervical back: No rigidity.  Skin:    General: Skin is warm and dry.  Neurological:     Mental Status: She is alert and oriented to person, place, and time.  Psychiatric:        Behavior: Behavior normal.     Ortho Exam patient has mild knee swelling significant medial joint line tenderness pain with ambulation standing position.  Negative logroll hips no rash over exposed skin.  Specialty Comments:  No specialty comments available.  Imaging: No results found.   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) 08/07/2022   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   Osteoarthritis, multiple sites 11/05/2021   Anxiety 10/29/2021   SCC (squamous cell carcinoma) 09/10/2021   Right-sided epistaxis 08/27/2021   HOH (hard of hearing) 08/20/2021   Sepsis (HCC) 06/06/2021   Lactose intolerance 05/14/2021   Osteopenia after menopause 01/22/2021   OSA (obstructive sleep apnea) 11/27/2020   Postmenopause 11/27/2020   Abnormal barium swallow  10/30/2020   Precordial pain    Other spondylosis with radiculopathy, cervical region 02/19/2020   Prediabetes 01/09/2020   Adverse reaction to COVID-19 vaccine 11/29/2019   Chronic cough 11/24/2019   Melanoma of skin (HCC) 08/24/2019   Palpitations 06/07/2019   Schatzki's ring 05/12/2019   Hiatal hernia 05/12/2019   Insomnia 03/29/2019   Back pain with left-sided sciatica 03/01/2019   Daytime sleepiness 11/30/2018   History of colonic polyps 09/03/2018   Dysphagia 09/03/2018   Pyrosis 09/03/2018   Chronic sore throat 09/03/2018   Actinic keratoses 02/23/2018   Seasonal and perennial allergic rhinitis 01/31/2018   Infected surgical wound 12/01/2017   Incontinent of urine 12/01/2017   Essential hypertension 09/06/2017   History of stroke 09/06/2017   Hyperlipidemia 09/06/2017   GAD (generalized anxiety disorder) 09/06/2017   Acquired hypothyroidism 09/06/2017   Chronic sinusitis 09/06/2017   Gastroesophageal reflux disease 09/06/2017   Past Medical History:  Diagnosis Date   Anxiety    Atypical mole 11/11/2020   Left Inner Knee (moderate-free)   Colon polyps    Depression    Hyperlipidemia    Hypertension    Hypothyroidism    Melanoma in situ (HCC) 09/19/2018   Right Arm (excision)   Skin cancer    Sleep apnea    Stroke [facility]) 2008    Family History  Problem Relation Age of Onset   Lung cancer Mother 47       smoker   Heart disease Mother    Stroke Father 71   Heart attack Father    Allergic rhinitis Sister    Hypertension Sister    CVA Brother    Lung cancer Maternal Grandfather    Heart attack Paternal Grandfather    Melanoma Daughter    Psoriasis Daughter    Rheum arthritis Daughter    Bipolar disorder Daughter    Colon cancer Neg Hx    Esophageal cancer Neg Hx    Inflammatory bowel disease Neg Hx    Liver disease Neg Hx    Pancreatic cancer Neg Hx    Stomach cancer Neg Hx    Rectal cancer Neg Hx     Past Surgical History:  Procedure Laterality  Date   ADENOIDECTOMY     APPENDECTOMY  1960   CATARACT EXTRACTION Bilateral    COLONOSCOPY     GUM SURGERY  03/2019   LUMBAR DISC SURGERY  2014   MELANOMA EXCISION  2020   MESH APPLIED TO LAP PORT     SINUS EXPLORATION     11/17/2021 operation for right nasal cavity and 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   TONSILLECTOMY  1940   Social History   Occupational History   Occupation:  retired Chemical engineer  Tobacco Use   Smoking status: Never   Smokeless tobacco: Never  Vaping Use   Vaping status: Never Used  Substance and Sexual Activity   Alcohol use: Not Currently    Comment: hardly ever   Drug use: Never   Sexual activity: Not Currently

## 2023-01-14 ENCOUNTER — Encounter: Payer: Medicare HMO | Admitting: Orthopaedic Surgery

## 2023-01-20 ENCOUNTER — Other Ambulatory Visit: Payer: Self-pay

## 2023-01-20 DIAGNOSIS — E7800 Pure hypercholesterolemia, unspecified: Secondary | ICD-10-CM

## 2023-01-20 MED ORDER — PRAVASTATIN SODIUM 40 MG PO TABS: 40.0000 mg | ORAL_TABLET | Freq: Every day | ORAL | 0 refills | Status: DC

## 2023-01-20 MED ORDER — CLOPIDOGREL BISULFATE 75 MG PO TABS: 75.0000 mg | ORAL_TABLET | Freq: Every day | ORAL | 1 refills | Status: DC

## 2023-01-20 NOTE — Telephone Encounter (Signed)
 Patient is requesting a refill of the following medications: Requested Prescriptions   Pending Prescriptions Disp Refills   clopidogrel  (PLAVIX ) 75 MG tablet 90 tablet 1    Sig: Take 1 tablet (75 mg total) by mouth daily.   pravastatin  (PRAVACHOL ) 40 MG tablet 90 tablet 0    Sig: Take 1 tablet (40 mg total) by mouth daily.   Patient states she has been stuck in [facility] with Covid and is running out of medication. Patient states she needs at least an 8 day supply. To the pended pharmacy.

## 2023-01-30 ENCOUNTER — Other Ambulatory Visit: Payer: Self-pay | Admitting: Nurse Practitioner

## 2023-01-31 NOTE — Telephone Encounter (Signed)
 Medications not on patient medication list. Pend and sent to PCP [name], [name], NP for approval.

## 2023-02-01 ENCOUNTER — Other Ambulatory Visit: Payer: Self-pay

## 2023-02-01 DIAGNOSIS — F411 Generalized anxiety disorder: Secondary | ICD-10-CM

## 2023-02-01 DIAGNOSIS — K219 Gastro-esophageal reflux disease without esophagitis: Secondary | ICD-10-CM

## 2023-02-01 MED ORDER — ZOLPIDEM TARTRATE 3.5 MG SL SUBL: SUBLINGUAL_TABLET | SUBLINGUAL | 1 refills | Status: DC

## 2023-02-01 NOTE — Telephone Encounter (Signed)
 Patient is requesting a refill of the following medications: Requested Prescriptions   Pending Prescriptions Disp Refills   Zolpidem  Tartrate 3.5 MG SUBL 30 tablet 1    Sig: TAKE 1 TABLET BY MOUTH EVERY DAY AT BEDTIME AS NEEDED    Date of last refill: 06/22/22  Refill amount: 30/0  Treatment agreement date: Not on file and no pending appointment

## 2023-02-03 ENCOUNTER — Other Ambulatory Visit: Payer: Self-pay

## 2023-02-03 DIAGNOSIS — F411 Generalized anxiety disorder: Secondary | ICD-10-CM

## 2023-02-03 DIAGNOSIS — K219 Gastro-esophageal reflux disease without esophagitis: Secondary | ICD-10-CM

## 2023-02-03 MED ORDER — ZOLPIDEM TARTRATE 3.5 MG SL SUBL
SUBLINGUAL_TABLET | SUBLINGUAL | 1 refills | Status: DC
Start: 2023-02-03 — End: 2023-06-30

## 2023-02-03 NOTE — Telephone Encounter (Signed)
Patient is requesting a refill of the following medications: Requested Prescriptions   Pending Prescriptions Disp Refills   Zolpidem Tartrate 3.5 MG SUBL 30 tablet 1    Sig: TAKE 1 TABLET BY MOUTH EVERY DAY AT BEDTIME AS NEEDED    Date of last refill:02/01/2023 incorrect pharmacy  Refill amount: 30 tablets 1 refill   Treatment agreement date:

## 2023-02-03 NOTE — Telephone Encounter (Signed)
Patient is requesting a refill of the following medications: Requested Prescriptions          Pending Prescriptions Disp Refills   Zolpidem Tartrate 3.5 MG SUBL 30 tablet 1      Sig: TAKE 1 TABLET BY MOUTH EVERY DAY AT BEDTIME AS NEEDED    Prescription was sent to the pharmacy in [facility] and patient is back home

## 2023-02-03 NOTE — Progress Notes (Signed)
Patient is requesting a refill of the following medications: Requested Prescriptions          Pending Prescriptions Disp Refills   Zolpidem Tartrate 3.5 MG SUBL 30 tablet 1      Sig: TAKE 1 TABLET BY MOUTH EVERY DAY AT BEDTIME AS NEEDED    Prescription was sent to the pharmacy in [facility] and patient is back home

## 2023-02-08 ENCOUNTER — Ambulatory Visit: Payer: Medicare HMO | Admitting: Orthopaedic Surgery

## 2023-02-08 VITALS — BP 154/63 | HR 87

## 2023-02-08 DIAGNOSIS — M23203 Derangement of unspecified medial meniscus due to old tear or injury, right knee: Secondary | ICD-10-CM | POA: Diagnosis not present

## 2023-02-08 NOTE — Progress Notes (Signed)
Office Visit Note   Patient: Darlene [name]           Date of Birth: 08/10/1933           MRN: [id number] Visit Date: 02/08/2023              Requested by: [name], [name], NP [address] [place],  [place] [address] PCP: [name], [name], NP   Assessment & Plan: Visit Diagnoses:  1. Other old tear of medial meniscus of right knee     Plan: Patient did have 5 physical therapy visits.  No more therapy due to her interval COVID and admission to the hospital.  She is gradually resuming normal activities.  Recheck 6 weeks.  Follow-Up Instructions: Return in about 6 weeks (around 03/22/2023).   Orders:  No orders of the defined types were placed in this encounter.  No orders of the defined types were placed in this encounter.     Procedures: No procedures performed   Clinical Data: No additional findings.   Subjective: Chief Complaint  Patient presents with   Right Knee - Pain    HPI 89 year old female returns with ongoing problems with her knee.  She states she was in [place] in [place] and got COVID.  She was in the hospital and states she still feels somewhat tired but drove back last week.  She has not been on her feet as much and states she has not been doing any exercises and states the knee is feeling better.  Her therapist had given her some leg weights but she did not use it since she has been sick with COVID.  Knee currently is feeling better she is walking better but certainly has not been as active as she was.  Previously.  Review of Systems unchanged other than the recent COVID.  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   Objective: Vital Signs: BP (!) 154/63 (BP Location: Right Arm, Patient Position: Sitting)   Pulse 87   Physical Exam Constitutional:      Appearance: She is well-developed.  HENT:     Head: Normocephalic.     Right Ear: External ear normal.     Left Ear: External ear normal. There is no  impacted cerumen.  Eyes:     Pupils: Pupils are equal, round, and reactive to light.  Neck:     Thyroid: No thyromegaly.     Trachea: No tracheal deviation.  Cardiovascular:     Rate and Rhythm: Normal rate.  Pulmonary:     Effort: Pulmonary effort is normal.  Abdominal:     Palpations: Abdomen is soft.  Musculoskeletal:     Cervical back: No rigidity.  Skin:    General: Skin is warm and dry.  Neurological:     Mental Status: She is alert and oriented to person, place, and time.  Psychiatric:        Behavior: Behavior normal.     Ortho Exam no knee effusion patient is ambulating without limping.  Negative logroll hips no rash over exposed skin.  Specialty Comments:  No specialty comments available.  Imaging: No results found.   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) 08/07/2022   Malignant melanoma  of nasal cavity (HCC) 07/29/2022   Osteoarthritis, multiple sites 11/05/2021   Anxiety 10/29/2021   SCC (squamous cell carcinoma) 09/10/2021   Right-sided epistaxis 08/27/2021   HOH (hard of hearing) 08/20/2021   Sepsis (HCC) 06/06/2021   Lactose intolerance 05/14/2021   Osteopenia after menopause 01/22/2021   OSA (obstructive sleep apnea) 11/27/2020   Postmenopause 11/27/2020   Abnormal barium swallow 10/30/2020   Precordial pain    Other spondylosis with radiculopathy, cervical region 02/19/2020   Prediabetes 01/09/2020   Adverse reaction to COVID-19 vaccine 11/29/2019   Chronic cough 11/24/2019   Melanoma of skin (HCC) 08/24/2019   Palpitations 06/07/2019   Schatzki's ring 05/12/2019   Hiatal hernia 05/12/2019   Insomnia 03/29/2019   Back pain with left-sided sciatica 03/01/2019   Daytime sleepiness 11/30/2018   History of colonic polyps  09/03/2018   Dysphagia 09/03/2018   Pyrosis 09/03/2018   Chronic sore throat 09/03/2018   Actinic keratoses 02/23/2018   Seasonal and perennial allergic rhinitis 01/31/2018   Infected surgical wound 12/01/2017   Incontinent of urine 12/01/2017   Essential hypertension 09/06/2017   History of stroke 09/06/2017   Hyperlipidemia 09/06/2017   GAD (generalized anxiety disorder) 09/06/2017   Acquired hypothyroidism 09/06/2017   Chronic sinusitis 09/06/2017   Gastroesophageal reflux disease 09/06/2017   Past Medical History:  Diagnosis Date   Anxiety    Atypical mole 11/11/2020   Left Inner Knee (moderate-free)   Colon polyps    Depression    Hyperlipidemia    Hypertension    Hypothyroidism    Melanoma in situ (HCC) 09/19/2018   Right Arm (excision)   Skin cancer    Sleep apnea    Stroke [facility]) 2008    Family History  Problem Relation Age of Onset   Lung cancer Mother 85       smoker   Heart disease Mother    Stroke Father 37   Heart attack Father    Allergic rhinitis Sister    Hypertension Sister    CVA Brother    Lung cancer Maternal Grandfather    Heart attack Paternal Grandfather    Melanoma Daughter    Psoriasis Daughter    Rheum arthritis Daughter    Bipolar disorder Daughter    Colon cancer Neg Hx    Esophageal cancer Neg Hx    Inflammatory bowel disease Neg Hx    Liver disease Neg Hx    Pancreatic cancer Neg Hx    Stomach cancer Neg Hx    Rectal cancer Neg Hx     Past Surgical History:  Procedure Laterality Date   ADENOIDECTOMY     APPENDECTOMY  1960   CATARACT EXTRACTION Bilateral    COLONOSCOPY     GUM SURGERY  03/2019   LUMBAR DISC SURGERY  2014   MELANOMA EXCISION  2020   MESH APPLIED TO LAP PORT     SINUS EXPLORATION     11/17/2021 operation for right nasal cavity and 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   TONSILLECTOMY  1940   Social History   Occupational History   Occupation: retired Sales promotion account executive  Tobacco Use   Smoking status: Never   Smokeless tobacco: Never  Vaping Use   Vaping status: Never Used  Substance and Sexual Activity   Alcohol use: Not Currently    Comment: hardly ever   Drug use: Never   Sexual activity: Not Currently

## 2023-02-24 ENCOUNTER — Telehealth: Payer: Self-pay

## 2023-02-24 NOTE — Telephone Encounter (Signed)
 Patient left a voicemail over the phone to speak with [name], [name], NP about her appointment on yesterday at the cancer center at [facility].  However patient did wanted to make an appointment with [name], [name], NP but she full booked until next week. Patient was at [facility] to get a Covid vaccine because there people at [facility] that is sick with Covid.

## 2023-02-25 ENCOUNTER — Other Ambulatory Visit: Payer: Self-pay | Admitting: Nurse Practitioner

## 2023-02-26 ENCOUNTER — Other Ambulatory Visit: Payer: Self-pay | Admitting: Nurse Practitioner

## 2023-03-10 ENCOUNTER — Encounter: Payer: Self-pay | Admitting: Nurse Practitioner

## 2023-03-10 ENCOUNTER — Non-Acute Institutional Stay: Payer: Medicare HMO | Admitting: Nurse Practitioner

## 2023-03-10 VITALS — BP 132/84 | HR 74 | Temp 97.7°F | Ht 63.0 in | Wt 146.0 lb

## 2023-03-10 DIAGNOSIS — R32 Unspecified urinary incontinence: Secondary | ICD-10-CM

## 2023-03-10 DIAGNOSIS — F419 Anxiety disorder, unspecified: Secondary | ICD-10-CM

## 2023-03-10 DIAGNOSIS — C300 Malignant neoplasm of nasal cavity: Secondary | ICD-10-CM | POA: Diagnosis not present

## 2023-03-10 DIAGNOSIS — E039 Hypothyroidism, unspecified: Secondary | ICD-10-CM

## 2023-03-10 DIAGNOSIS — K219 Gastro-esophageal reflux disease without esophagitis: Secondary | ICD-10-CM

## 2023-03-10 DIAGNOSIS — R682 Dry mouth, unspecified: Secondary | ICD-10-CM | POA: Insufficient documentation

## 2023-03-10 DIAGNOSIS — R400 Somnolence: Secondary | ICD-10-CM | POA: Diagnosis not present

## 2023-03-10 DIAGNOSIS — Z8673 Personal history of transient ischemic attack (TIA), and cerebral infarction without residual deficits: Secondary | ICD-10-CM | POA: Diagnosis not present

## 2023-03-10 DIAGNOSIS — I10 Essential (primary) hypertension: Secondary | ICD-10-CM

## 2023-03-10 DIAGNOSIS — E782 Mixed hyperlipidemia: Secondary | ICD-10-CM

## 2023-03-10 DIAGNOSIS — M8580 Other specified disorders of bone density and structure, unspecified site: Secondary | ICD-10-CM

## 2023-03-10 DIAGNOSIS — M150 Primary generalized (osteo)arthritis: Secondary | ICD-10-CM

## 2023-03-10 DIAGNOSIS — Z780 Asymptomatic menopausal state: Secondary | ICD-10-CM

## 2023-03-10 NOTE — Assessment & Plan Note (Signed)
 chronic, escalated since  recurrent melanoma in nose

## 2023-03-10 NOTE — Assessment & Plan Note (Signed)
DEXA 12/25/20, t score -1.0,  10 years possibility major fx 11%, hip fx 2.6%. Vit D, Cal-diet rich,  Vit D 3 39 12/26/20

## 2023-03-10 NOTE — Assessment & Plan Note (Signed)
takes Pravastatin, LDL 94 05/12/21 

## 2023-03-10 NOTE — Assessment & Plan Note (Signed)
Dry mouth, cevimeline, cause frequent BM, fatigue, wants to eliminate Ambien.

## 2023-03-10 NOTE — Assessment & Plan Note (Signed)
Urinary frequency/leakage, 2-3x/night, stopped taking Gemtesa, acupuncture improved initially. Saw urology.

## 2023-03-10 NOTE — Assessment & Plan Note (Addendum)
on Levothyroxine-declined dose adjustment.  TSH 0.168 02/18/22, 0.17 08/03/22, repeat TSH

## 2023-03-10 NOTE — Assessment & Plan Note (Signed)
blood pressure is controlled on Losartan qd. added Imdur 30mg  tid by Cardiology. Followed by Cardiology. CTA negative 09/01/20. Bun/creat 17/0.69 07/21/22

## 2023-03-10 NOTE — Assessment & Plan Note (Signed)
Hx of CVA, on plavix. Statin,  residual of  poor left peripheral vision  

## 2023-03-10 NOTE — Assessment & Plan Note (Addendum)
Not new, had sleep study, telemetry w/o significant findings  Sleeps too much during day, failed dc Ambien, off Valium, refused CPAP, feels a veil in front of left eye, more noticeable left facial weakness

## 2023-03-10 NOTE — Assessment & Plan Note (Signed)
saw Ortho. Cervical spinal stenosis, MR 02/26/20, C6-C7. Pending R mensicus tender surgery, Tramadol, Advil.

## 2023-03-10 NOTE — Assessment & Plan Note (Signed)
stable Omeprazole, FDgard helped,  saw GI, EGD 12/26/19. Vit B12 383 12/02/20, Hgb 14.3 09/16/22. GI 09/03/21, pending repeat endoscope.

## 2023-03-10 NOTE — Progress Notes (Signed)
Location:   [facility]inic [facility]   Place of Service:  Clinic (12) Provider: Arna Snipe Athelene Hursey NP  Bright Spielmann X, NP  Patient Care Team: Weylyn Ricciuti X, NP as PCP - General (Internal Medicine) Clark-Burning, Victorino Dike, PA-C (Inactive) (Dermatology) Yates Decamp, MD as Consulting Physician (Cardiology) Mansouraty, Netty Starring., MD as Consulting Physician (Gastroenterology) Drema Halon, MD (Inactive) as Consulting Physician (Otolaryngology) Glyn Ade, PA-C as Physician Assistant (Dermatology) Plonk, Wilder Glade, MD as Referring Physician (Otolaryngology)  Extended Emergency Contact Information Primary Emergency Contact: [facility] Phone: [phone] Relation: Son Secondary Emergency Contact: William Dalton of [place] Home Phone: [phone] Mobile Phone: [phone] Relation: Son  Code Status:  DNR Goals of care: Advanced Directive information    12/30/2022    1:00 PM  Advanced Directives  Does Patient Have a Medical Advance Directive? Yes  Type of Estate agent of [employer];Living will  Does patient want to make changes to medical advance directive? No - Patient declined  Copy of Healthcare Power of Attorney in Chart? No - copy requested     Chief Complaint  Patient presents with   Always sleepy     Patient questions if medications are working against one another. Discuss checking Thyroid.     HPI:  Pt is a 89 y.o. female seen today for medical management of chronic diseases.    Dry mouth, cevimeline, cause frequent BM, fatigue, wants to eliminate Ambien to avoid day time sleepiness.    Resolved pain allover after immunotherapy infusion,, cancer center recommended Tylenol up to 3000mg  a day for pain.                          After 2nd Shingrix, feels aches, headache, more nasal/ear congestion.    07/21/22 ED eval, dehydration, gum swelling             Malignant melanoma of nasal cavity, followed by oncology, ENT, dental team,  on immunotherapy             Anxiety, chronic, escalated since  recurrent melanoma in nose              Sleeps too much during day, failed dc Ambien in the past,  off Valium, refused CPAP, feels a veil in front of left eye, more noticeable left facial weakness Hx of chronic rhinitis/sphenoidal sinusitis, s/p surgery, improved SOB, wheezing, but still has nasal/right ear congestion, on Astelin, Flonase, Bronchodilator, underwent Pulmonology, ENT, GI, Allergy evaluation, had spirometry 10/20/20, Echo 60-65% EF 04/16/20, CTA 09/01/20. Saw ENT Incidental finding of melanoma in nose, follows oncology             Chronic sore throat: multiple factorials, GERD vs chronic rhinitis/sinusitis,  ENT  Insomnia trial of dc Ambien             Hx of CVA, on plavix. Statin,  residual of  poor left peripheral vision  Hypothyroidism, on Levothyroxine-declined dose adjustment.  TSH 0.168 02/18/22, 0.17 08/03/22             HTN, blood pressure is controlled on Losartan qd. added Imdur 30mg  tid by Cardiology. Followed by Cardiology. CTA negative 09/01/20. Bun/creat 17/0.69 07/21/22            GERD, stable Omeprazole, FDgard helped,  saw GI, EGD 12/26/19. Vit B12 383 12/02/20, Hgb 14.3 09/16/22. GI 09/03/21, pending repeat endoscope.              Lactose intolerance, diet adjustment,  f/u GI             Esophageal dysmotility, Swallow studay 05/12/20, worked with ST, GI f/u             OSA sleep study, saw Pulmonology.  Not using CPAP                                     OA, saw Ortho. Cervical spinal stenosis, MR 02/26/20, C6-C7. Delaying  R mensicus tender surgery, Tramadol, Advil.              Hyperlipidemia, takes Pravastatin, LDL 94 05/12/21             Urinary frequency/leakage, 2-3x/night, stopped taking Gemtesa, acupuncture improved initially. Saw urology.              Osteopenia, DEXA 12/25/20, t score -1.0,  10 years possibility major fx 11%, hip fx 2.6%. Vit D, Cal-diet rich,  Vit D 3 39 12/26/20             HOH audiology eval,  ENT placed tube in the right ear-failed-again, hearing aids   Past Medical History:  Diagnosis Date   Anxiety    Atypical mole 11/11/2020   Left Inner Knee (moderate-free)   Colon polyps    Depression    Hyperlipidemia    Hypertension    Hypothyroidism    Melanoma in situ (HCC) 09/19/2018   Right Arm (excision)   Skin cancer    Sleep apnea    Stroke [facility]) 2008   Past Surgical History:  Procedure Laterality Date   ADENOIDECTOMY     APPENDECTOMY  1960   CATARACT EXTRACTION Bilateral    COLONOSCOPY     GUM SURGERY  03/2019   LUMBAR DISC SURGERY  2014   MELANOMA EXCISION  2020   MESH APPLIED TO LAP PORT     SINUS EXPLORATION     11/17/2021 operation for right nasal cavity and 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   TONSILLECTOMY  1940    Allergies  Allergen Reactions   Lactose Intolerance (Gi) Other (See Comments)    indigestion   Norco [Hydrocodone-Acetaminophen] Nausea And Vomiting   Dust Mite Extract Other (See Comments)    Sinus drainage   Oxycontin [Oxycodone Hcl] Nausea And Vomiting   Pollen Extract Other (See Comments)    Sinus drainage    Allergies as of 03/10/2023       Reactions   Lactose Intolerance (gi) Other (See Comments)   indigestion   Norco [hydrocodone-acetaminophen] Nausea And Vomiting   Dust Mite Extract Other (See Comments)   Sinus drainage   Oxycontin [oxycodone Hcl] Nausea And Vomiting   Pollen Extract Other (See Comments)   Sinus drainage        Medication List        Accurate as of March 10, 2023  4:37 PM. If you have any questions, ask your nurse or doctor.          acetaminophen 325 MG tablet Commonly known as: TYLENOL Take 650 mg by mouth every 6 (six) hours as needed for headache (pain).   acetaminophen 500 MG tablet Commonly known as: TYLENOL Take 1,000 mg by mouth every 8 (eight) hours.   amoxicillin-clavulanate 875-125 MG tablet Commonly known as: AUGMENTIN Take 1 tablet by mouth  2 (two) times daily.   antiseptic oral rinse Liqd 15 mLs by Mouth Rinse  route as needed for dry mouth or mouth pain.   Azelastine HCl 137 MCG/SPRAY Soln PLACE 1 TO 2 SPRAYS EACH NOSTRIL 1-2 TIMES A DAY AS NEEDED FOR RUNNY NOSE/DRAINAGE DOWN THROAT   calcium carbonate 500 MG chewable tablet Commonly known as: TUMS - dosed in mg elemental calcium Chew 1 tablet by mouth as needed for indigestion or heartburn.   cevimeline 30 MG capsule Commonly known as: EVOXAC Take 30 mg by mouth at bedtime.   clopidogrel 75 MG tablet Commonly known as: PLAVIX Take 1 tablet (75 mg total) by mouth daily.   cycloSPORINE 0.05 % ophthalmic emulsion Commonly known as: RESTASIS Place 1 drop into both eyes as needed.   fluticasone 50 MCG/ACT nasal spray Commonly known as: FLONASE INSTILL 1-2 SPRAYS INTO EACH NOSTRIL DAILY   ibuprofen 400 MG tablet Commonly known as: ADVIL Take 400 mg by mouth every 6 (six) hours as needed for moderate pain (pain score 4-6).   ketoconazole 2 % shampoo Commonly known as: NIZORAL APPLY TOPICALLY 2 TIMES A WEEK   losartan 50 MG tablet Commonly known as: COZAAR Take 1 tablet (50 mg total) by mouth daily.   nitroGLYCERIN 0.4 MG SL tablet Commonly known as: NITROSTAT Place 1 tablet (0.4 mg total) under the tongue every 5 (five) minutes as needed for up to 25 days for chest pain.   omeprazole 40 MG capsule Commonly known as: PRILOSEC Take 1 capsule (40 mg total) by mouth as needed.   oxymetazoline 0.05 % nasal spray Commonly known as: Afrin Nasal Spray Place 1 spray into both nostrils 2 (two) times daily.   pravastatin 40 MG tablet Commonly known as: PRAVACHOL Take 1 tablet (40 mg total) by mouth daily.   Synthroid 100 MCG tablet Generic drug: levothyroxine TAKE 1 TABLET BY MOUTH EVERY DAY BEFORE BREAKFAST   Systane 0.4-0.3 % Gel ophthalmic gel Generic drug: Polyethyl Glycol-Propyl Glycol Place 1 application. into both eyes at bedtime.   Zolpidem  Tartrate 3.5 MG Subl TAKE 1 TABLET BY MOUTH EVERY DAY AT BEDTIME AS NEEDED        Review of Systems  Constitutional:  Positive for fatigue. Negative for appetite change and fever.  HENT:  Positive for congestion and hearing loss. Negative for nosebleeds, sinus pressure, sore throat and voice change.        Chronic sinusitis, underwent ENT, sleep study-sleep apnea.  Eyes:  Positive for visual disturbance.       Left lateral visual field deficit. Dry eyes  Respiratory:  Negative for cough and shortness of breath.   Cardiovascular:  Negative for leg swelling.  Gastrointestinal:  Negative for abdominal pain and constipation.  Genitourinary:  Positive for frequency. Negative for dysuria and urgency.       Urinary leakage, urinary frequency 2-3x/night. Doing Kegel exercise.   Musculoskeletal:  Positive for arthralgias. Negative for back pain and gait problem.       Aches allover  Skin:  Negative for color change.       Mohs dorsum right hand, healed.   Neurological:  Negative for speech difficulty, weakness and headaches.  Psychiatric/Behavioral:  Positive for sleep disturbance. Negative for dysphoric mood. The patient is not nervous/anxious.        Early am awake, difficulty returning asleep.     Immunization History  Administered Date(s) Administered   Hepatitis A 04/04/2000   Influenza-Unspecified 10/18/2014   Moderna Sars-Covid-2 Vaccination 01/22/2019, 02/19/2019, 06/05/2020   Pneumococcal Conjugate-13 12/20/2018   Pneumococcal Polysaccharide-23 01/18/2005   Tdap 11/25/2008  Zoster Recombinant(Shingrix) 12/20/2018, 01/20/2022, 09/15/2022   Pertinent  Health Maintenance Due  Topic Date Due   Colonoscopy  10/25/2021   INFLUENZA VACCINE  08/19/2022   DEXA SCAN  Completed      07/29/2022    1:14 PM 09/30/2022    2:49 PM 11/30/2022   11:22 AM 12/09/2022    3:27 PM 12/30/2022   12:59 PM  Fall Risk  Falls in the past year? 0 0 0 0 0  Was there an injury with Fall? 0 0 0 0 0   Fall Risk Category Calculator 0 0 0 0 0  Patient at Risk for Falls Due to No Fall Risks No Fall Risks No Fall Risks    Fall risk Follow up Falls evaluation completed Falls evaluation completed Falls evaluation completed;Education provided;Falls prevention discussed     Functional Status Survey:    Vitals:   03/10/23 1456  BP: 132/84  Pulse: 74  Temp: 97.7 F (36.5 C)  SpO2: 98%  Weight: 146 lb (66.2 kg)  Height: 5\' 3"  (1.6 m)   Body mass index is 25.86 kg/m. Physical Exam Vitals and nursing note reviewed.  Constitutional:      Appearance: Normal appearance.  HENT:     Head: Normocephalic and atraumatic.     Ears:     Comments: Right ear tube placed, mild erythema, no bleeding.     Nose: Nose normal.     Mouth/Throat:     Mouth: Mucous membranes are moist.     Pharynx: No oropharyngeal exudate or posterior oropharyngeal erythema.  Eyes:     Extraocular Movements: Extraocular movements intact.     Conjunctiva/sclera: Conjunctivae normal.     Pupils: Pupils are equal, round, and reactive to light.     Comments: Left peripheral visual field deficit since CVA 2006  Cardiovascular:     Rate and Rhythm: Normal rate and regular rhythm.     Heart sounds: No murmur heard. Pulmonary:     Effort: Pulmonary effort is normal.     Breath sounds: Rales present.     Comments: Bibasilar rales.  Abdominal:     General: Bowel sounds are normal.     Palpations: Abdomen is soft.     Tenderness: There is no abdominal tenderness.  Musculoskeletal:        General: Tenderness present.     Cervical back: Normal range of motion and neck supple.     Right lower leg: No edema.     Left lower leg: No edema.     Comments: Aches allover, R knee pain when standing, no s/s of infection or injury.   Skin:    General: Skin is warm and dry.  Neurological:     General: No focal deficit present.     Mental Status: She is alert and oriented to person, place, and time. Mental status is at baseline.      Motor: Weakness present.     Gait: Gait normal.     Comments: Left facial weakness  Psychiatric:        Mood and Affect: Mood normal.        Behavior: Behavior normal.        Thought Content: Thought content normal.        Judgment: Judgment normal.     Labs reviewed: Recent Labs    06/15/22 1706 07/21/22 0522 09/16/22 1510  NA 141 140 139  K 3.9 3.7 4.0  CL 108 107 106  CO2 26 23 26  GLUCOSE 102* 117* 119*  BUN 19 17 14   CREATININE 0.81 0.69 0.65  CALCIUM 9.1 9.3 9.3   Recent Labs    06/15/22 1706 07/21/22 0522  AST 18 18  ALT 17 16  ALKPHOS 58 67  BILITOT 0.5 0.7  PROT 6.7 6.7  ALBUMIN 3.7 3.9   Recent Labs    06/15/22 1706 07/21/22 0522 09/16/22 1510  WBC 7.0 6.3 9.7  NEUTROABS 4.2 3.5  --   HGB 13.6 13.7 14.3  HCT 40.6 41.4 43.9  MCV 90.0 90.4 91.1  PLT 243 243 247   Lab Results  Component Value Date   TSH 0.17 (L) 08/03/2022   Lab Results  Component Value Date   HGBA1C 6.1 (H) 06/07/2021   Lab Results  Component Value Date   CHOL 163 05/12/2021   HDL 44 05/12/2021   LDLCALC 94 05/12/2021   TRIG 144 05/12/2021   CHOLHDL 3.5 12/26/2019    Significant Diagnostic Results in last 30 days:  No results found.  Assessment/Plan  Daytime sleepiness Not new, had sleep study, telemetry w/o significant findings  Sleeps too much during day, failed dc Ambien, off Valium, refused CPAP, feels a veil in front of left eye, more noticeable left facial weakness  Malignant melanoma of nasal cavity (HCC) Hx of chronic rhinitis/sphenoidal sinusitis, s/p surgery, improved SOB, wheezing, but still has nasal/right ear congestion, on Astelin, Flonase, Bronchodilator, underwent Pulmonology, ENT, GI, Allergy evaluation, had spirometry 10/20/20, Echo 60-65% EF 04/16/20, CTA 09/01/20. Saw ENT Incidental finding of melanoma in nose, follows oncology             Chronic sore throat: multiple factorials, GERD vs chronic rhinitis/sinusitis,  ENT   Anxiety   chronic, escalated since  recurrent melanoma in nose  History of stroke Hx of CVA, on plavix. Statin,  residual of  poor left peripheral vision   Hypothyroidism  on Levothyroxine-declined dose adjustment.  TSH 0.168 02/18/22, 0.17 08/03/22, repeat TSH  Essential hypertension blood pressure is controlled on Losartan qd. added Imdur 30mg  tid by Cardiology. Followed by Cardiology. CTA negative 09/01/20. Bun/creat 17/0.69 07/21/22  Gastroesophageal reflux disease stable Omeprazole, FDgard helped,  saw GI, EGD 12/26/19. Vit B12 383 12/02/20, Hgb 14.3 09/16/22. GI 09/03/21, pending repeat endoscope.   Osteoarthritis, multiple sites  saw Ortho. Cervical spinal stenosis, MR 02/26/20, C6-C7. Pending R mensicus tender surgery, Tramadol, Advil.   Hyperlipidemia takes Pravastatin, LDL 94 05/12/21  Incontinent of urine Urinary frequency/leakage, 2-3x/night, stopped taking Gemtesa, acupuncture improved initially. Saw urology.   Osteopenia after menopause DEXA 12/25/20, t score -1.0,  10 years possibility major fx 11%, hip fx 2.6%. Vit D, Cal-diet rich,  Vit D 3 39 12/26/20  Dry mouth Dry mouth, cevimeline, cause frequent BM, fatigue, wants to eliminate Ambien.    Family/ staff Communication: plan of care review with the patient.   Labs/tests ordered:  TSH

## 2023-03-10 NOTE — Assessment & Plan Note (Signed)
Hx of chronic rhinitis/sphenoidal sinusitis, s/p surgery, improved SOB, wheezing, but still has nasal/right ear congestion, on Astelin, Flonase, Bronchodilator, underwent Pulmonology, ENT, GI, Allergy evaluation, had spirometry 10/20/20, Echo 60-65% EF 04/16/20, CTA 09/01/20. Saw ENT Incidental finding of melanoma in nose, follows oncology             Chronic sore throat: multiple factorials, GERD vs chronic rhinitis/sinusitis,  ENT

## 2023-03-14 ENCOUNTER — Other Ambulatory Visit: Payer: Medicare HMO

## 2023-03-15 LAB — TSH: TSH: 0.77 m[IU]/L (ref 0.40–4.50)

## 2023-03-18 ENCOUNTER — Other Ambulatory Visit: Payer: Self-pay | Admitting: Nurse Practitioner

## 2023-03-22 ENCOUNTER — Telehealth: Payer: Self-pay | Admitting: *Deleted

## 2023-03-22 ENCOUNTER — Ambulatory Visit: Payer: Medicare HMO | Admitting: Orthopaedic Surgery

## 2023-03-22 ENCOUNTER — Encounter: Payer: Self-pay | Admitting: Physician Assistant

## 2023-03-22 ENCOUNTER — Ambulatory Visit: Payer: Medicare HMO | Admitting: Physician Assistant

## 2023-03-22 ENCOUNTER — Telehealth: Payer: Self-pay

## 2023-03-22 VITALS — BP 126/78 | HR 68 | Ht 63.0 in | Wt 158.0 lb

## 2023-03-22 DIAGNOSIS — K8020 Calculus of gallbladder without cholecystitis without obstruction: Secondary | ICD-10-CM | POA: Diagnosis not present

## 2023-03-22 DIAGNOSIS — C312 Malignant neoplasm of frontal sinus: Secondary | ICD-10-CM | POA: Diagnosis not present

## 2023-03-22 DIAGNOSIS — S83241S Other tear of medial meniscus, current injury, right knee, sequela: Secondary | ICD-10-CM

## 2023-03-22 DIAGNOSIS — R1319 Other dysphagia: Secondary | ICD-10-CM

## 2023-03-22 DIAGNOSIS — K219 Gastro-esophageal reflux disease without esophagitis: Secondary | ICD-10-CM | POA: Diagnosis not present

## 2023-03-22 DIAGNOSIS — Z8673 Personal history of transient ischemic attack (TIA), and cerebral infarction without residual deficits: Secondary | ICD-10-CM

## 2023-03-22 MED ORDER — OMEPRAZOLE 40 MG PO CPDR: 40.0000 mg | DELAYED_RELEASE_CAPSULE | ORAL | 3 refills | Status: DC | PRN

## 2023-03-22 NOTE — Progress Notes (Signed)
 Attending Physician's Attestation   I have reviewed the chart.   I agree with the Advanced Practitioner's note, impression, and recommendations with any updates as below.    Corliss Parish, MD [facility] Advanced Endoscopy Office # [phone]

## 2023-03-22 NOTE — Patient Instructions (Addendum)
 Silent reflux: Not all heartburn burns...Marland KitchenMarland KitchenMarland Kitchen  START ONE THE OMEPRAZOLE 40 MG ONCE A DAY 30 MINS AN HOUR BEFORE FOOD  What is LPR? Laryngopharyngeal reflux (LPR) or silent reflux is a condition in which acid that is made in the stomach travels up the esophagus (swallowing tube) and gets to the throat. Not everyone with reflux has a lot of heartburn or indigestion. In fact, many people with LPR never have heartburn. This is why LPR is called SILENT REFLUX, and the terms "Silent reflux" and "LPR" are often used interchangeably. Because LPR is silent, it is sometimes difficult to diagnose.  How can you tell if you have LPR?  Chronic hoarseness- Some people have hoarseness that comes and goes throat clearing  Cough It can cause shortness of breath and cause asthma like symptoms. a feeling of a lump in the throat  difficulty swallowing a problem with too much nose and throat drainage.  Some people will feel their esophagus spasm which feels like their heart beating hard and fast, this will usually be after a meal, at rest, or lying down at night.    How do I treat this? Treatment for LPR should be individualized, and your doctor will suggest the best treatment for you. Generally there are several treatments for LPR: changing habits and diet to reduce reflux,  medications to reduce stomach acid, and  surgery to prevent reflux. Most people with LPR need to modify how and when they eat, as well as take some medication, to get well. Sometimes, nonprescription liquid antacids, such as Maalox, Gelucil and Mylanta are recommended. When used, these antacids should be taken four times each day - one tablespoon one hour after each meal and before bedtime. Dietary and lifestyle changes alone are not often enough to control LPR - medications that reduce stomach acid are also usually needed. These must be prescribed by our doctor.   TIPS FOR REDUCING REFLUX AND LPR Control your LIFE-STYLE and your  DIET! If you use tobacco, QUIT.  Smoking makes you reflux. After every cigarette you have some LPR.  Don't wear clothing that is too tight, especially around the waist (trousers, corsets, belts).  Do not lie down just after eating...in fact, do not eat within three hours of bedtime.  You should be on a low-fat diet.  Limit your intake of red meat.  Limit your intake of butter.  Avoid fried foods.  Avoid chocolate  Avoid cheese.  Avoid eggs. Specifically avoid caffeine (especially coffee and tea), soda pop (especially cola) and mints.  Avoid alcoholic beverages, particularly in the evening.   You will be contacted by [facility] Scheduling in the next 2 days to arrange a right upper quadrant ultrasound.  The number on your caller ID will be [phone], please answer when they call.  If you have not heard from them in 2 days please call [phone] to schedule.    We will contact you when the modified barium swallow is scheduled  Due to recent changes in healthcare laws, you may see the results of your imaging and laboratory studies on MyChart before your provider has had a chance to review them.  We understand that in some cases there may be results that are confusing or concerning to you. Not all laboratory results come back in the same time frame and the provider may be waiting for multiple results in order to interpret others.  Please give [place] 48 hours in order for your provider to thoroughly review all  the results before contacting the office for clarification of your results.    I appreciate the  opportunity to care for you  Thank You   [facility]

## 2023-03-22 NOTE — Telephone Encounter (Signed)
 Request for surgical clearance:     Endoscopy Procedure  What type of surgery is being performed?     EGD  When is this surgery scheduled?     TBD  What type of clearance is required ?   Pharmacy  Plavix and Cardiac clearance   Are there any medications that need to be held prior to surgery and how long? 5 days  Practice name and name of physician performing surgery?      [facility]  What is your office phone and fax number?      Phone- [phone]  Fax- [phone]  Anesthesia type (None, local, MAC, general) ?       MAC   Please route your response to Zella Ball [phone]

## 2023-03-22 NOTE — Telephone Encounter (Signed)
 We will plan on updated endoscopy at [facility] with Dr. Meridee Score for next available

## 2023-03-22 NOTE — Progress Notes (Signed)
 Pt not seen . Knee better . I called her and discussed. She will let us know if she has problems.

## 2023-03-22 NOTE — Progress Notes (Signed)
 03/22/2023 Darlene [name] [id number] Aug 19, 1933  Referring provider: NP. Man [name] Primary GI doctor: Dr. Meridee Score  ASSESSMENT AND PLAN:  Gastroesophageal reflux disease without esophagitis, dysphagia some coughing during drinking/eating, dry mouth secondary to medications EGD 2020 showed grade C and had to have repeat EGD 2021 that showed improvement.  04/2020 barium swallow marked dysmotility Had office visit with Dr. Meridee Score and scheduled for endoscopy but this was not done due to reaction of cancer infusion We will plan on updated endoscopy at [facility] with Dr. Meridee Score for next available I discussed risks of EGD with patient today, including risk of sedation, bleeding or perforation.  Patient provides understanding and gave verbal consent to proceed. With coughing and history consider modified barium swallow, had one 2022 no orophangeal aspirations Has some cough, sore throat and mouth dryness from her medications that may contribute to her symptoms Has not been taking her omeprazole, will start on this once daily for possible LPR  Chest discomfort Non exertional, at night 2022 CTA cardiac calcium score of 0  ? Esophageal  Get cardiac clearance and ask to hold plavix Schedule at the hospital with Dr. Meridee Score  invasive mucosal melanoma nasal vault area. 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  Patient understands that there is a low but real risk of cardiovascular event such as heart attack, stroke, or embolism /  thrombosis, or ischemia while off Plavix.  The patient consents to proceed.  Will communicate by phone or EMR with patient's prescribing provider to confirm that holding Plavix is reasonable in this case.   Gallbladder thickening  on  CT chest AB and pelvis 02/22/2023 Normal LFTs Will get AB [place] RUQ  History of Present Illness:  89 y.o. female  with a past medical history of CVA (on Plavix), atrial fibrillation, anxiety/depression, sleep apnea, colon polyps, status post appendectomy, chronic sore throat, esophagitis, hiatal hernia, esophageal dysmotility, diverticulosis, hemorrhoids, colonic AVM, adenomatous colon polyps 10/2018 and others listed below, returns to clinic today for evaluation of chronic sinusitis/GERD.  05/08/2021 patient seen in the office for abdominal bloating and diarrhea thought to be due to lactose has improved since stopping. Given FODMAP diet. Hospital admission 05/20-05/22 for chest discomfort and non productive cough, found to have acute hypoxic respiratory failure positive for rhinovirus and sepsis. Had chronic sinus disease, chronic sore throat/cough. Discharged on augmentin and prednisone.  06/06/2021 Repeat CXR unremarkable.  09/02/2021 patient office visit with myself for GERD/chronic sinusitis.  Was leaving for [place] and opted not to have endoscopic evaluation, patient also high risk with age and Plavix.  Given omeprazole 40 mg twice daily and Carafate added.  Patient also had follow-up with [facility] at that time. 11/04/2021 surgical pathology from biopsies along right inferior turbinate and lateral nasal wall showed mucosal melanoma.  PET/CT negative for distance disease.  Face/sinus MRI with and without showed enhancement in right nasal cavity without spread elsewhere. 11/17/2021 operation for right nasal cavity and 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  Defers rhinectomy/surgical therapy and starts immunotherapy for mucosal melanoma.  Discussed the use of AI scribe software for clinical note transcription with the patient, who gave verbal consent to proceed.  History of Present Illness   Darlene [name] is an 89 year old female who presents with persistent throat  symptoms and swallowing difficulties.  She experiences persistent sore throats, frequent coughing, and sneezing. Her cough occurs in sequences of two to three continuous coughs. She also has a dry mouth, particularly severe at night, causing her mouth to feel 'glued together'. She uses sugarless throat lozenges during the day and takes a medication at night to manage dryness. Swallowing difficulties include occasional choking on liquids like tea, leading to episodes requiring assistance, such as the Heimlich maneuver. These episodes are described as 'scary', with a sensation of food or drink going down the wrong pipe.  She has a history of cancer and has been on Opdiva, which has caused side effects including swelling in her mouth. She uses topical medication to manage the swelling. Despite these side effects, her cancer was regressing as of November and has not spread elsewhere according to monthly tests.  She has been prescribed omeprazole for reflux but does not take it consistently, as she does not experience heartburn. However, she acknowledges symptoms that could be related to silent reflux, such as coughing and trouble swallowing.  She reports periodic chest pain and heart flutters, for which she takes nitroglycerin three to four times a week, usually at night. She sometimes feels pain radiating to her shoulder and arm. No association of these symptoms with exertion or eating.  She recalls a CT scan from February showing slightly enhancing gallbladder wall and mild inflammatory changes, but her liver function tests were normal. She experiences some gassiness and discomfort in the right upper quadrant.      04/2020 barium swallow marked dysmotility MBS 04/2020 Pt demonstrates no oral or oropharyngeal dyspahgia. However, even with initial sips, prior to significant esophageal filling, pt had immediate coughing, throat clearing and report of feeling irritated. SHe coughed throughout the study  without any laryngeal penetration or aspiration.  2021 EGD relook after treatment with empiric dilatation due to dysphagia with mild improvement. 10/26/2018 endoscopy for reflux showed LA grade C esophagitis, 4 cm hiatal hernia multiple gastric polyps 10/26/2018 colonoscopy nonbleeding nonthrombosed external and internal hemorrhoids, diverticulosis diffuse, 5 (2 adenomatous and 3 sessile serrated) polyps 2-13 millimeters in size, single colonic angiectasia, normally would recommend colonoscopy 3 years  02/2023 CT chest Ab and pelvis with contrast at [facility] Stable pre-existing punctate nodules. No new or enlarging nodules. Unchanged radiolucency within the osseous structures from 04/05/2022.  Slightly enhancing gallbladder wall along with mild inflammatory changes could represent inflammation. Please correlate clinically for possible cholecystitis. Cholelithiasis also noted.  A focal nonenhancing area seen in the uterine body. This is unchanged from 03/26/2022. Please correlate with physical examination. Further evaluation with ultrasound would be helpful if needed.   Current Medications:   Current Outpatient Medications (Endocrine & Metabolic):    SYNTHROID 100 MCG tablet, TAKE 1 TABLET BY MOUTH EVERY DAY BEFORE BREAKFAST  Current Outpatient Medications (Cardiovascular):    losartan (COZAAR) 50 MG tablet, Take 1 tablet (50 mg total) by mouth daily.   nitroGLYCERIN (NITROSTAT) 0.4 MG SL tablet, Place 1 tablet (0.4 mg total) under the tongue every 5 (five) minutes as needed for up to 25 days for chest pain.   pravastatin (PRAVACHOL) 40 MG tablet, Take 1 tablet (40 mg total) by mouth daily.  Current Outpatient Medications (Respiratory):    Azelastine HCl 137 MCG/SPRAY SOLN, PLACE 1 TO 2 SPRAYS EACH NOSTRIL 1-2 TIMES A DAY AS NEEDED FOR RUNNY NOSE/DRAINAGE DOWN THROAT   fluticasone (FLONASE) 50 MCG/ACT nasal spray, INSTILL 1-2 SPRAYS INTO EACH NOSTRIL DAILY (Patient not taking: Reported  on 03/22/2023)    oxymetazoline (AFRIN NASAL SPRAY) 0.05 % nasal spray, Place 1 spray into both nostrils 2 (two) times daily. (Patient not taking: Reported on 03/22/2023)  Current Outpatient Medications (Analgesics):    ibuprofen (ADVIL) 400 MG tablet, Take 400 mg by mouth every 6 (six) hours as needed for moderate pain (pain score 4-6).   acetaminophen (TYLENOL) 325 MG tablet, Take 650 mg by mouth every 6 (six) hours as needed for headache (pain). (Patient not taking: Reported on 12/30/2022)   acetaminophen (TYLENOL) 500 MG tablet, Take 1,000 mg by mouth every 8 (eight) hours. (Patient not taking: Reported on 12/30/2022)  Current Outpatient Medications (Hematological):    clopidogrel (PLAVIX) 75 MG tablet, Take 1 tablet (75 mg total) by mouth daily.  Current Outpatient Medications (Other):    antiseptic oral rinse (BIOTENE) LIQD, 15 mLs by Mouth Rinse route as needed for dry mouth or mouth pain.   cevimeline (EVOXAC) 30 MG capsule, Take 30 mg by mouth at bedtime.   ketoconazole (NIZORAL) 2 % shampoo, APPLY TOPICALLY 2 TIMES A WEEK   Vitamin D, Ergocalciferol, (DRISDOL) 1.25 MG (50000 UNIT) CAPS capsule, TAKE 1 CAPSULE BY MOUTH ONE TIME PER WEEK   Zolpidem Tartrate 3.5 MG SUBL, TAKE 1 TABLET BY MOUTH EVERY DAY AT BEDTIME AS NEEDED   amoxicillin-clavulanate (AUGMENTIN) 875-125 MG tablet, Take 1 tablet by mouth 2 (two) times daily. (Patient not taking: Reported on 03/22/2023)   calcium carbonate (TUMS - DOSED IN MG ELEMENTAL CALCIUM) 500 MG chewable tablet, Chew 1 tablet by mouth as needed for indigestion or heartburn. (Patient not taking: Reported on 03/22/2023)   cycloSPORINE (RESTASIS) 0.05 % ophthalmic emulsion, Place 1 drop into both eyes as needed. (Patient not taking: Reported on 03/22/2023)   omeprazole (PRILOSEC) 40 MG capsule, Take 1 capsule (40 mg total) by mouth as needed.   Polyethyl Glycol-Propyl Glycol (SYSTANE) 0.4-0.3 % GEL ophthalmic gel, Place 1 application. into both eyes at bedtime. (Patient not taking:  Reported on 03/22/2023)  Surgical History:  She  has a past surgical history that includes Lumbar disc surgery (2014); Appendectomy (1960); Tonsillectomy (1940); Adenoidectomy; Mesh applied to lap port; Colonoscopy; Gum surgery (03/2019); Melanoma excision (2020); Cataract extraction (Bilateral); and Sinus exploration. Family History:  Her family history includes Allergic rhinitis in her sister; Bipolar disorder in her daughter; CVA in her brother; Heart attack in her father and paternal grandfather; Heart disease in her mother; Hypertension in her sister; Lung cancer in her maternal grandfather; Lung cancer (age of onset: 44) in her mother; Melanoma in her daughter; Psoriasis in her daughter; Rheum arthritis in her daughter; Stroke (age of onset: 72) in her father. Social History:   reports that she has never smoked. She has never used smokeless tobacco. She reports that she does not currently use alcohol. She reports that she does not use drugs.  Current Medications, Allergies, Past Medical History, Past Surgical History, Family History and Social History were reviewed in [employer] record.  Physical Exam: BP 126/78   Pulse 68   Ht 5\' 3"  (1.6 m)   Wt 158 lb (71.7 kg)   BMI 27.99 kg/m  General:   Pleasant, well developed female in no acute distress, HOH Heart:  Regular rate and rhythm; no murmurs Pulm: Clear anteriorly; no wheezing Sinus: has some tenderness to palpation bilateral maxillary sinuses, right greater than left, some erythema/discoloring of lower inner lip, no neck lymphadenopathy Abdomen:  Soft, Obese AB, Active bowel sounds. Mild epigastric and RUQ  [place], No organomegaly appreciated. Extremities:  without  edema. Neurologic:  Alert and  oriented x4;  No focal deficits.  Psych:  Cooperative. Normal mood and affect.   Doree Albee, PA-C 03/22/23

## 2023-03-23 ENCOUNTER — Other Ambulatory Visit: Payer: Self-pay

## 2023-03-23 DIAGNOSIS — K219 Gastro-esophageal reflux disease without esophagitis: Secondary | ICD-10-CM

## 2023-03-23 NOTE — Telephone Encounter (Signed)
 EGD has been set up for 05/26/23 at 8 am with GM at [facility]  EGD scheduled, pt instructed and medications reviewed.  Patient instructions mailed to home.  Patient to call with any questions or concerns.

## 2023-03-27 ENCOUNTER — Other Ambulatory Visit: Payer: Self-pay | Admitting: Cardiology

## 2023-03-27 DIAGNOSIS — E7800 Pure hypercholesterolemia, unspecified: Secondary | ICD-10-CM

## 2023-03-30 ENCOUNTER — Other Ambulatory Visit: Payer: Self-pay | Admitting: Nurse Practitioner

## 2023-03-30 NOTE — Telephone Encounter (Signed)
 Pharmacy requested refill.  Pended and sent to [facility] for approval.

## 2023-03-31 ENCOUNTER — Ambulatory Visit: Payer: Self-pay | Admitting: Nurse Practitioner

## 2023-03-31 ENCOUNTER — Encounter: Payer: Self-pay | Admitting: Nurse Practitioner

## 2023-03-31 VITALS — BP 122/78 | HR 68 | Temp 97.7°F | Ht 63.0 in | Wt 157.0 lb

## 2023-03-31 DIAGNOSIS — I10 Essential (primary) hypertension: Secondary | ICD-10-CM | POA: Diagnosis not present

## 2023-03-31 DIAGNOSIS — K219 Gastro-esophageal reflux disease without esophagitis: Secondary | ICD-10-CM

## 2023-03-31 DIAGNOSIS — F419 Anxiety disorder, unspecified: Secondary | ICD-10-CM | POA: Diagnosis not present

## 2023-03-31 DIAGNOSIS — E039 Hypothyroidism, unspecified: Secondary | ICD-10-CM

## 2023-03-31 NOTE — Assessment & Plan Note (Signed)
 stable Omeprazole, FDgard helped,  saw GI, EGD 12/26/19. Vit B12 383 12/02/20, Hgb 14.3 09/16/22. GI 09/03/21 Esophageal dysmotility, Swallow studay 05/12/20, worked with ST, GI f/u Pending repeat EGD

## 2023-03-31 NOTE — Assessment & Plan Note (Signed)
 on Levothyroxine-declined dose adjustment.  TSH 0.168 02/18/22, 0.77 03/14/23

## 2023-03-31 NOTE — Assessment & Plan Note (Signed)
 chronic, escalated since  recurrent melanoma in nose

## 2023-03-31 NOTE — Assessment & Plan Note (Signed)
blood pressure is controlled on Losartan qd. added Imdur 30mg  tid by Cardiology. Followed by Cardiology. CTA negative 09/01/20. Bun/creat 17/0.69 07/21/22

## 2023-03-31 NOTE — Telephone Encounter (Signed)
 Patients procedure is May 8th with Dr Meridee Score. Man [name] NP looks like last prescriber of Plavix, I will route to  to see If I can get a response    Man [name] Please advise if you are the prescriber of her Plavix, Thanks

## 2023-03-31 NOTE — Progress Notes (Unsigned)
 Location:   [facility]   Place of Service:    Provider: Chipper Oman NP  Lamarcus Spira X, NP  Patient Care Team: Arnola Crittendon X, NP as PCP - General (Internal Medicine) Clark-Burning, Victorino Dike, PA-C (Inactive) (Dermatology) Yates Decamp, MD as Consulting Physician (Cardiology) Mansouraty, Netty Starring., MD as Consulting Physician (Gastroenterology) Drema Halon, MD (Inactive) as Consulting Physician (Otolaryngology) Glyn Ade, PA-C as Physician Assistant (Dermatology) Plonk, Wilder Glade, MD as Referring Physician (Otolaryngology)  Extended Emergency Contact Information Primary Emergency Contact: [facility] Phone: [phone] Relation: Son Secondary Emergency Contact: William Dalton of [place] Home Phone: [phone] Mobile Phone: [phone] Relation: Son  Code Status:  DNR Goals of care: Advanced Directive information    12/30/2022    1:00 PM  Advanced Directives  Does Patient Have a Medical Advance Directive? Yes  Type of Estate agent of [place];Living will  Does patient want to make changes to medical advance directive? No - Patient declined  Copy of Healthcare Power of Attorney in Chart? No - copy requested     Chief Complaint  Patient presents with  . Medication Problem    Patient is requesting a through review of all medications.     HPI:  Pt is a 89 y.o. female seen today for medical management of chronic diseases.       Dry mouth, cevimeline, cause frequent BM, night sweat, fatigue, wants to eliminate Ambien to avoid day time sleepiness.               Resolved pain allover after immunotherapy infusion,, cancer center recommended Tylenol up to 3000mg  a day for pain.                          After 2nd Shingrix, feels aches, headache, more nasal/ear congestion.    07/21/22 ED eval, dehydration, gum swelling             Malignant melanoma of nasal cavity, followed by oncology, ENT, dental team, on  immunotherapy             Anxiety, chronic, escalated since  recurrent melanoma in nose              Sleeps too much during day, failed dc Ambien in the past,  off Valium, refused CPAP, feels a veil in front of left eye, more noticeable left facial weakness Hx of chronic rhinitis/sphenoidal sinusitis, s/p surgery, improved SOB, wheezing, but still has nasal/right ear congestion, on Astelin, Flonase, Bronchodilator, underwent Pulmonology, ENT, GI, Allergy evaluation, had spirometry 10/20/20, Echo 60-65% EF 04/16/20, CTA 09/01/20. Saw ENT Incidental finding of melanoma in nose, follows ocncology             Chronic sore throat: multiple factorials, GERD vs chronic rhinitis/sinusitis,  ENT  Insomnia trial of dc Ambien             Hx of CVA, on plavix. Statin,  residual of  poor left peripheral vision  Hypothyroidism, on Levothyroxine-declined dose adjustment.  TSH 0.168 02/18/22, 0.77 03/14/23             HTN, blood pressure is controlled on Losartan qd. added Imdur 30mg  tid by Cardiology. Followed by Cardiology. CTA negative 09/01/20. Bun/creat 17/0.69 07/21/22            GERD, stable Omeprazole, FDgard helped,  saw GI, EGD 12/26/19. Vit B12 383 12/02/20, Hgb 14.3 09/16/22. GI 09/03/21  Lactose intolerance, diet adjustment, f/u GI             Esophageal dysmotility, Swallow studay 05/12/20, worked with ST, GI f/u             OSA sleep study, saw Pulmonology.  Not using CPAP                                     OA, saw Ortho. Cervical spinal stenosis, MR 02/26/20, C6-C7. Delaying  R mensicus tender surgery, Tramadol, Advil.              Hyperlipidemia, takes Pravastatin, LDL 94 05/12/21             Urinary frequency/leakage, 2-3x/night, stopped taking Gemtesa, acupuncture improved initially. Saw urology.              Osteopenia, DEXA 12/25/20, t score -1.0,  10 years possibility major fx 11%, hip fx 2.6%. Vit D, Cal-diet rich,  Vit D 3 39 12/26/20             HOH audiology eval, ENT placed tube in the right  ear-failed-again, hearing aids    Past Medical History:  Diagnosis Date  . Anxiety   . Atypical mole 11/11/2020   Left Inner Knee (moderate-free)  . Colon polyps   . Depression   . Hyperlipidemia   . Hypertension   . Hypothyroidism   . Melanoma in situ (HCC) 09/19/2018   Right Arm (excision)  . Skin cancer   . Sleep apnea   . Stroke [facility]) 2008   Past Surgical History:  Procedure Laterality Date  . ADENOIDECTOMY    . APPENDECTOMY  1960  . CATARACT EXTRACTION Bilateral   . COLONOSCOPY    . GUM SURGERY  03/2019  . LUMBAR DISC SURGERY  2014  . MELANOMA EXCISION  2020  . MESH APPLIED TO LAP PORT    . SINUS EXPLORATION     11/17/2021 operation for right nasal cavity and 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  . TONSILLECTOMY  1940    Allergies  Allergen Reactions  . Lactose Intolerance (Gi) Other (See Comments)    indigestion  . Norco [Hydrocodone-Acetaminophen] Nausea And Vomiting  . Dust Mite Extract Other (See Comments)    Sinus drainage  . Oxycontin [Oxycodone Hcl] Nausea And Vomiting  . Pollen Extract Other (See Comments)    Sinus drainage    Allergies as of 03/31/2023       Reactions   Lactose Intolerance (gi) Other (See Comments)   indigestion   Norco [hydrocodone-acetaminophen] Nausea And Vomiting   Dust Mite Extract Other (See Comments)   Sinus drainage   Oxycontin [oxycodone Hcl] Nausea And Vomiting   Pollen Extract Other (See Comments)   Sinus drainage        Medication List        Accurate as of March 31, 2023  3:48 PM. If you have any questions, ask your nurse or doctor.          STOP taking these medications    acetaminophen 325 MG tablet Commonly known as: TYLENOL Stopped by: Tyneshia Stivers X Destyni Hoppel   acetaminophen 500 MG tablet Commonly known as: TYLENOL Stopped by: Deasia Chiu X Claudio Mondry       TAKE these medications    amoxicillin-clavulanate 875-125 MG tablet Commonly known as: AUGMENTIN Take 1 tablet by mouth 2 (two)  times daily as needed.  amoxicillin-clavulanate 875-125 MG tablet Commonly known as: AUGMENTIN Take 1 tablet by mouth 2 (two) times daily.   antiseptic oral rinse Liqd 15 mLs by Mouth Rinse route daily.   Azelastine HCl 137 MCG/SPRAY Soln USE 1 TO 2 SPRAYS IN EACH NOSTRIL 1-2 TIMES A DAY AS NEEDED FOR RUNNY NOSE/DRAINAGE DOWN THROAT   calcium carbonate 500 MG chewable tablet Commonly known as: TUMS - dosed in mg elemental calcium Chew 1 tablet by mouth as needed for indigestion or heartburn.   cevimeline 30 MG capsule Commonly known as: EVOXAC Take 30 mg by mouth 3 (three) times daily as needed.   clopidogrel 75 MG tablet Commonly known as: PLAVIX Take 1 tablet (75 mg total) by mouth daily.   cycloSPORINE 0.05 % ophthalmic emulsion Commonly known as: RESTASIS Place 1 drop into both eyes as needed.   fluticasone 50 MCG/ACT nasal spray Commonly known as: FLONASE INSTILL 1-2 SPRAYS INTO EACH NOSTRIL DAILY   ibuprofen 400 MG tablet Commonly known as: ADVIL Take 400 mg by mouth every 6 (six) hours as needed for moderate pain (pain score 4-6).   ketoconazole 2 % shampoo Commonly known as: NIZORAL APPLY TOPICALLY 2 TIMES A WEEK   losartan 50 MG tablet Commonly known as: COZAAR Take 1 tablet (50 mg total) by mouth daily.   nitroGLYCERIN 0.4 MG SL tablet Commonly known as: NITROSTAT Place 1 tablet (0.4 mg total) under the tongue every 5 (five) minutes as needed for up to 25 days for chest pain.   omeprazole 40 MG capsule Commonly known as: PRILOSEC Take 1 capsule (40 mg total) by mouth as needed.   oxymetazoline 0.05 % nasal spray Commonly known as: Afrin Nasal Spray Place 1 spray into both nostrils 2 (two) times daily.   pravastatin 40 MG tablet Commonly known as: PRAVACHOL TAKE 1 TABLET BY MOUTH EVERY DAY   Synthroid 100 MCG tablet Generic drug: levothyroxine TAKE 1 TABLET BY MOUTH EVERY DAY BEFORE BREAKFAST   Systane 0.4-0.3 % Gel ophthalmic gel Generic  drug: Polyethyl Glycol-Propyl Glycol Place 1 application. into both eyes at bedtime.   Vitamin D (Ergocalciferol) 1.25 MG (50000 UNIT) Caps capsule Commonly known as: DRISDOL TAKE 1 CAPSULE BY MOUTH ONE TIME PER WEEK   Zolpidem Tartrate 3.5 MG Subl TAKE 1 TABLET BY MOUTH EVERY DAY AT BEDTIME AS NEEDED        Review of Systems  Constitutional:  Positive for fatigue. Negative for appetite change and fever.  HENT:  Positive for congestion and hearing loss. Negative for nosebleeds, sinus pressure, sore throat and voice change.        Chronic sinusitis, underwent ENT, sleep study-sleep apnea.  Eyes:  Positive for visual disturbance.       Left lateral visual field deficit. Dry eyes  Respiratory:  Negative for cough and shortness of breath.   Cardiovascular:  Negative for leg swelling.  Gastrointestinal:  Negative for abdominal pain and constipation.  Genitourinary:  Positive for frequency. Negative for dysuria and urgency.       Urinary leakage, urinary frequency 2-3x/night. Doing Kegel exercise.   Musculoskeletal:  Positive for arthralgias. Negative for back pain and gait problem.       Aches allover  Skin:  Negative for color change.       Mohs dorsum right hand, healed.   Neurological:  Negative for speech difficulty, weakness and headaches.  Psychiatric/Behavioral:  Positive for sleep disturbance. Negative for dysphoric mood. The patient is not nervous/anxious.  Early am awake, difficulty returning asleep.     Immunization History  Administered Date(s) Administered  . Hepatitis A 04/04/2000  . Influenza-Unspecified 10/18/2014  . Moderna Sars-Covid-2 Vaccination 01/22/2019, 02/19/2019, 06/05/2020  . Pneumococcal Conjugate-13 12/20/2018  . Pneumococcal Polysaccharide-23 01/18/2005  . Tdap 11/25/2008  . Zoster Recombinant(Shingrix) 12/20/2018, 01/20/2022, 09/15/2022   Pertinent  Health Maintenance Due  Topic Date Due  . Colonoscopy  10/25/2021  . INFLUENZA VACCINE   08/19/2022  . DEXA SCAN  Completed      07/29/2022    1:14 PM 09/30/2022    2:49 PM 11/30/2022   11:22 AM 12/09/2022    3:27 PM 12/30/2022   12:59 PM  Fall Risk  Falls in the past year? 0 0 0 0 0  Was there an injury with Fall? 0 0 0 0 0  Fall Risk Category Calculator 0 0 0 0 0  Patient at Risk for Falls Due to No Fall Risks No Fall Risks No Fall Risks    Fall risk Follow up Falls evaluation completed Falls evaluation completed Falls evaluation completed;Education provided;Falls prevention discussed     Functional Status Survey:    Vitals:   03/31/23 1526  BP: 122/78  Pulse: 68  Temp: 97.7 F (36.5 C)  TempSrc: Temporal  SpO2: 99%  Weight: 157 lb (71.2 kg)  Height: 5\' 3"  (1.6 m)   Body mass index is 27.81 kg/m. Physical Exam Vitals and nursing note reviewed.  Constitutional:      Appearance: Normal appearance.  HENT:     Head: Normocephalic and atraumatic.     Ears:     Comments: Right ear tube placed, mild erythema, no bleeding.     Nose: Nose normal.     Mouth/Throat:     Mouth: Mucous membranes are moist.     Pharynx: No oropharyngeal exudate or posterior oropharyngeal erythema.  Eyes:     Extraocular Movements: Extraocular movements intact.     Conjunctiva/sclera: Conjunctivae normal.     Pupils: Pupils are equal, round, and reactive to light.     Comments: Left peripheral visual field deficit since CVA 2006  Cardiovascular:     Rate and Rhythm: Normal rate and regular rhythm.     Heart sounds: No murmur heard. Pulmonary:     Effort: Pulmonary effort is normal.     Breath sounds: Rales present.     Comments: Bibasilar rales.  Abdominal:     General: Bowel sounds are normal.     Palpations: Abdomen is soft.     Tenderness: There is no abdominal tenderness.  Musculoskeletal:        General: Tenderness present.     Cervical back: Normal range of motion and neck supple.     Right lower leg: No edema.     Left lower leg: No edema.     Comments: Aches  allover, R knee pain when standing, no s/s of infection or injury.   Skin:    General: Skin is warm and dry.  Neurological:     General: No focal deficit present.     Mental Status: She is alert and oriented to person, place, and time. Mental status is at baseline.     Motor: Weakness present.     Gait: Gait normal.     Comments: Left facial weakness  Psychiatric:        Mood and Affect: Mood normal.        Behavior: Behavior normal.        Thought  Content: Thought content normal.        Judgment: Judgment normal.    Labs reviewed: Recent Labs    06/15/22 1706 07/21/22 0522 09/16/22 1510  NA 141 140 139  K 3.9 3.7 4.0  CL 108 107 106  CO2 26 23 26   GLUCOSE 102* 117* 119*  BUN 19 17 14   CREATININE 0.81 0.69 0.65  CALCIUM 9.1 9.3 9.3   Recent Labs    06/15/22 1706 07/21/22 0522  AST 18 18  ALT 17 16  ALKPHOS 58 67  BILITOT 0.5 0.7  PROT 6.7 6.7  ALBUMIN 3.7 3.9   Recent Labs    06/15/22 1706 07/21/22 0522 09/16/22 1510  WBC 7.0 6.3 9.7  NEUTROABS 4.2 3.5  --   HGB 13.6 13.7 14.3  HCT 40.6 41.4 43.9  MCV 90.0 90.4 91.1  PLT 243 243 247   Lab Results  Component Value Date   TSH 0.77 03/14/2023   Lab Results  Component Value Date   HGBA1C 6.1 (H) 06/07/2021   Lab Results  Component Value Date   CHOL 163 05/12/2021   HDL 44 05/12/2021   LDLCALC 94 05/12/2021   TRIG 144 05/12/2021   CHOLHDL 3.5 12/26/2019    Significant Diagnostic Results in last 30 days:  No results found.  Assessment/Plan  Anxiety chronic, escalated since  recurrent melanoma in nose  Hypothyroidism  on Levothyroxine-declined dose adjustment.  TSH 0.168 02/18/22, 0.77 03/14/23  Essential hypertension blood pressure is controlled on Losartan qd. added Imdur 30mg  tid by Cardiology. Followed by Cardiology. CTA negative 09/01/20. Bun/creat 17/0.69 07/21/22  Gastroesophageal reflux disease stable Omeprazole, FDgard helped,  saw GI, EGD 12/26/19. Vit B12 383 12/02/20, Hgb 14.3  09/16/22. GI 09/03/21 Esophageal dysmotility, Swallow studay 05/12/20, worked with ST, GI f/u Pending repeat EGD   Family/ staff Communication: plan of care reviewed with the patient Labs/tests ordered:  prn

## 2023-04-01 ENCOUNTER — Telehealth: Payer: Self-pay | Admitting: Nurse Practitioner

## 2023-04-01 ENCOUNTER — Encounter: Payer: Self-pay | Admitting: Nurse Practitioner

## 2023-04-01 NOTE — Telephone Encounter (Signed)
 Medication is not in patient's current medication list.  Forwarded to [facility] to Advise.

## 2023-04-01 NOTE — Telephone Encounter (Signed)
 [name], [name], NP  You12 minutes ago (1:48 PM)   I am not sure why 2% lidocaine solution needed? Can you help? Thank you       LMOM for patient to return call

## 2023-04-01 NOTE — Telephone Encounter (Signed)
 Copied from CRM [id number]. Topic: Clinical - Medication Refill >> Apr 01, 2023 11:46 AM Everette Rank wrote: Most Recent Primary Care Visit:  Provider: [facility]  Department[facility]  Visit Type: LAB  Date: 03/14/2023  Medication: Lidocaine 2% solution   Has the patient contacted their pharmacy? No (Agent: If no, request that the patient contact the pharmacy for the refill. If patient does not wish to contact the pharmacy document the reason why and proceed with request.) (Agent: If yes, when and what did the pharmacy advise?)  Is this the correct pharmacy for this prescription? Yes If no, delete pharmacy and type the correct one.  This is the patient's preferred pharmacy:  [id number] [place], [place] - [address] [place] [address] Phone: [phone] Fax: [phone]   Has the prescription been filled recently? No  Is the patient out of the medication? Yes  Has the patient been seen for an appointment in the last year OR does the patient have an upcoming appointment? Yes  Can we respond through MyChart? Yes  Agent: Please be advised that Rx refills may take up to 3 business days. We ask that you follow-up with your pharmacy.

## 2023-04-06 NOTE — Telephone Encounter (Signed)
Tried calling patient, LMOM to return call.

## 2023-04-07 ENCOUNTER — Telehealth: Payer: Self-pay | Admitting: *Deleted

## 2023-04-07 ENCOUNTER — Telehealth: Payer: Self-pay | Admitting: Physician Assistant

## 2023-04-07 NOTE — Telephone Encounter (Signed)
   Name: Darlene [name]  DOB: 02-10-1933  MRN: [id number]  Primary Cardiologist: Dr. Jacinto Halim   Preoperative team, please contact this patient and set up a phone call appointment for further preoperative risk assessment. Please obtain consent and complete medication review. Thank you for your help.  I confirm that guidance regarding antiplatelet and oral anticoagulation therapy has been completed and, if necessary, noted below.  Patient's Plavix is prescribed for history of CVA by her PCP.  Recommendations for holding Plavix will need to come from prescribing provider.  I also confirmed the patient resides in the state of [place]. As per [facility] Medical Board telemedicine laws, the patient must reside in the state in which the provider is licensed.   Ronney Asters, NP 04/07/2023, 3:42 PM [facility]

## 2023-04-07 NOTE — Telephone Encounter (Signed)
 [facility] Pre-operative Risk Assessment     Request for surgical clearance:     Endoscopy Procedure  What type of surgery is being performed?     EGD at [facility]  When is this surgery scheduled?     May 8th   What type of clearance is required ?   Pharmacy Plavix  and Cardiac   Are there any medications that need to be held prior to surgery and how long? Plavix 5  days  Practice name and name of physician performing surgery?      [facility] Dr Meridee Score  What is your office phone and fax number?      Phone- [phone]  Fax- [phone]  Anesthesia type (None, local, MAC, general) ?       MAC   Please route your response to Merri Ray CMA or Hilma Favors RN

## 2023-04-07 NOTE — Telephone Encounter (Signed)
Darlene [name]    Please advise

## 2023-04-07 NOTE — Telephone Encounter (Signed)
 Pt has been scheduled tele preop appt 05/09/23. Med rec and consent are done.     Patient Consent for Virtual Visit        Suzette Flagler has provided verbal consent on 04/07/2023 for a virtual visit (video or telephone).   CONSENT FOR VIRTUAL VISIT FOR:  Darlene [name]  By participating in this virtual visit I agree to the following:  I hereby voluntarily request, consent and authorize [facility] and its employed or contracted physicians, physician assistants, nurse practitioners or other licensed health care professionals (the Practitioner), to provide me with telemedicine health care services (the "Services") as deemed necessary by the treating Practitioner. I acknowledge and consent to receive the Services by the Practitioner via telemedicine. I understand that the telemedicine visit will involve communicating with the Practitioner through live audiovisual communication technology and the disclosure of certain medical information by electronic transmission. I acknowledge that I have been given the opportunity to request an in-person assessment or other available alternative prior to the telemedicine visit and am voluntarily participating in the telemedicine visit.  I understand that I have the right to withhold or withdraw my consent to the use of telemedicine in the course of my care at any time, without affecting my right to future care or treatment, and that the Practitioner or I may terminate the telemedicine visit at any time. I understand that I have the right to inspect all information obtained and/or recorded in the course of the telemedicine visit and may receive copies of available information for a reasonable fee.  I understand that some of the potential risks of receiving the Services via telemedicine include:  Delay or interruption in medical evaluation due to technological equipment failure or disruption; Information transmitted may not be sufficient (e.g. poor resolution  of images) to allow for appropriate medical decision making by the Practitioner; and/or  In rare instances, security protocols could fail, causing a breach of personal health information.  Furthermore, I acknowledge that it is my responsibility to provide information about my medical history, conditions and care that is complete and accurate to the best of my ability. I acknowledge that Practitioner's advice, recommendations, and/or decision may be based on factors not within their control, such as incomplete or inaccurate data provided by me or distortions of diagnostic images or specimens that may result from electronic transmissions. I understand that the practice of medicine is not an exact science and that Practitioner makes no warranties or guarantees regarding treatment outcomes. I acknowledge that a copy of this consent can be made available to me via my patient portal [facility] MyChart), or I can request a printed copy by calling the office of [facility].    I understand that my insurance will be billed for this visit.   I have read or had this consent read to me. I understand the contents of this consent, which adequately explains the benefits and risks of the Services being provided via telemedicine.  I have been provided ample opportunity to ask questions regarding this consent and the Services and have had my questions answered to my satisfaction. I give my informed consent for the services to be provided through the use of telemedicine in my medical care

## 2023-04-07 NOTE — Telephone Encounter (Signed)
 Tried calling patient, LMOM to return call.  MyChart message also sent.

## 2023-04-07 NOTE — Telephone Encounter (Signed)
 Pt has been scheduled tele preop appt 05/09/23. Med rec and consent are done.

## 2023-04-07 NOTE — Telephone Encounter (Signed)
 Patient called stated she is concerned with taking Omeprazole along with Cevileline.

## 2023-04-07 NOTE — Telephone Encounter (Signed)
 I spoke with the patient and because she is on the Immunotherapy drug Opdivo she is worried that the Omeprazole is way too much with the dry mouth medication. I explained it was no interaction between the two that we could find, but she wasn't having it. I told her that its up to her to which medication she feels like she needs the most. She said her dry mouth is worse than the reflux. I told her just to temporarily stop the Omeprazole and if she doesn't see a change in her symptom of  fatigue then she knows its not the Omeprazole. I explained to her that Opdivo can cause extreme fatigue and that it is  more than likely  the treatment.  She agreed with the plan. She has an appointment with the doctor that gave her Cevileline ( dry mouth) tomorrow. She will discuss with him.   FYI- Quentin Mulling

## 2023-04-07 NOTE — Telephone Encounter (Signed)
 Procedure has been scheduled for May 8th. Please advise on Plavix

## 2023-04-08 ENCOUNTER — Encounter: Payer: Self-pay | Admitting: Adult Health

## 2023-04-08 ENCOUNTER — Telehealth: Admitting: Adult Health

## 2023-04-08 ENCOUNTER — Telehealth: Payer: Self-pay

## 2023-04-08 VITALS — Temp 97.2°F | Ht 63.0 in | Wt 157.0 lb

## 2023-04-08 DIAGNOSIS — T887XXA Unspecified adverse effect of drug or medicament, initial encounter: Secondary | ICD-10-CM | POA: Diagnosis not present

## 2023-04-08 DIAGNOSIS — R682 Dry mouth, unspecified: Secondary | ICD-10-CM | POA: Diagnosis not present

## 2023-04-08 NOTE — Progress Notes (Signed)
 This service is provided via telemedicine  No vital signs collected/recorded due to the encounter was a telemedicine visit.   Location of patient (ex: home, work):  home  Patient consents to a telephone visit: yes  Location of the provider (ex: office, home):  [facility]   Name of any referring provider:  N/A  Names of all persons participating in the telemedicine service and their role in the encounter:  Danicia Terhaar/ RMA, Richarda Blade, NP, and Patient.   Time spent on call:  11

## 2023-04-08 NOTE — Progress Notes (Signed)
 Virtual Visit via Video Note  I connected with Darlene [name] on 04/12/23 at  2:00 PM EDT by a video enabled telemedicine application and verified that I am speaking with the correct person using two identifiers.  Patient Location: Home Provider Location: Office/Clinic  I discussed the limitations, risks, security, and privacy concerns of performing an evaluation and management service by video and the availability of in person appointments. I also discussed with the patient that there may be a patient responsible charge related to this service. The patient expressed understanding and agreed to proceed.   PCP: [name], [name], NP  Chief Complaint  Patient presents with   Allergic Reaction    Allergic reaction to medication. Patient states she is having allergic reaction Cevimeline. Patient also have some concerns about some burning while urinating.   Discussed the use of AI scribe software for clinical note transcription with the patient, who gave verbal consent to proceed.  History of Present Illness   Darlene [name] is an 89 year old female who had a video visit due to a bad reaction to cevimeline which was referred by a mouth specialist at [facility] for management of dry mouth. Initially, she received this prescription from a dermatologist at [facility] due to skin issues and other related problems. She discontinued the medication last summer due to 'terrible side effects,' although she did not document the specifics at that time. Approximately six weeks ago, a mouth specialist affiliated with the cancer center re-prescribed cevimeline, suggesting a dosage of three times a day as needed. However, she has been taking it only once at night due to adverse effects.  The adverse effects include excessive sweating at night, frequent urination, and incontinence, leading to a raw, burning sensation in her lower urinary tract. She also reports dehydration despite  consuming 64 ounces of water daily, as the medication seems to cause rapid fluid loss. Despite these side effects, she continues to experience a parched and dry mouth, which is not alleviated by over-the-counter remedies like Biotene.  She regularly sips flavored water and uses sugar-free throat lozenges to stimulate saliva production. She also uses lip balm frequently to keep her lips hydrated and brushes her teeth more than twice a day. She does not chew gum but finds the lozenges helpful. She does not currently use a humidifier but has an air purifier in her apartment.  She notes that she sleeps better on nights when she does not take cevimeline. She has been using the medication for about three weeks since restarting it. Her children, although not present, provide her with supplies such as ice trays to help manage her symptoms.        ROS: Per HPI  Current Outpatient Medications:    amoxicillin-clavulanate (AUGMENTIN) 875-125 MG tablet, Take 1 tablet by mouth 2 (two) times daily., Disp: 20 tablet, Rfl: 0   amoxicillin-clavulanate (AUGMENTIN) 875-125 MG tablet, Take 1 tablet by mouth 2 (two) times daily as needed., Disp: , Rfl:    antiseptic oral rinse (BIOTENE) LIQD, 15 mLs by Mouth Rinse route daily., Disp: , Rfl:    Azelastine HCl 137 MCG/SPRAY SOLN, USE 1 TO 2 SPRAYS IN EACH NOSTRIL 1-2 TIMES A DAY AS NEEDED FOR RUNNY NOSE/DRAINAGE DOWN THROAT, Disp: 30 mL, Rfl: 1   cevimeline (EVOXAC) 30 MG capsule, Take 30 mg by mouth 3 (three) times daily as needed., Disp: , Rfl:    clopidogrel (PLAVIX) 75 MG tablet, Take 1 tablet (75 mg  total) by mouth daily., Disp: 90 tablet, Rfl: 1   fluticasone (FLONASE) 50 MCG/ACT nasal spray, INSTILL 1-2 SPRAYS INTO EACH NOSTRIL DAILY, Disp: 16 mL, Rfl: 5   ibuprofen (ADVIL) 400 MG tablet, Take 400 mg by mouth every 6 (six) hours as needed for moderate pain (pain score 4-6)., Disp: , Rfl:    ketoconazole (NIZORAL) 2 % shampoo, APPLY TOPICALLY 2 TIMES A WEEK, Disp:  120 mL, Rfl: 1   losartan (COZAAR) 50 MG tablet, Take 1 tablet (50 mg total) by mouth daily., Disp: 90 tablet, Rfl: 3   nitroGLYCERIN (NITROSTAT) 0.4 MG SL tablet, Place 1 tablet (0.4 mg total) under the tongue every 5 (five) minutes as needed for up to 25 days for chest pain., Disp: 25 tablet, Rfl: 3   omeprazole (PRILOSEC) 40 MG capsule, Take 1 capsule (40 mg total) by mouth as needed., Disp: 90 capsule, Rfl: 3   oxymetazoline (AFRIN NASAL SPRAY) 0.05 % nasal spray, Place 1 spray into both nostrils 2 (two) times daily., Disp: 30 mL, Rfl: 0   pravastatin (PRAVACHOL) 40 MG tablet, TAKE 1 TABLET BY MOUTH EVERY DAY, Disp: 30 tablet, Rfl: 0   SYNTHROID 100 MCG tablet, TAKE 1 TABLET BY MOUTH EVERY DAY BEFORE BREAKFAST, Disp: 90 tablet, Rfl: 1   Vitamin D, Ergocalciferol, (DRISDOL) 1.25 MG (50000 UNIT) CAPS capsule, TAKE 1 CAPSULE BY MOUTH ONE TIME PER WEEK, Disp: 12 capsule, Rfl: 3   Zolpidem Tartrate 3.5 MG SUBL, TAKE 1 TABLET BY MOUTH EVERY DAY AT BEDTIME AS NEEDED, Disp: 30 tablet, Rfl: 1   calcium carbonate (TUMS - DOSED IN MG ELEMENTAL CALCIUM) 500 MG chewable tablet, Chew 1 tablet by mouth as needed for indigestion or heartburn. (Patient not taking: Reported on 04/08/2023), Disp: , Rfl:    cycloSPORINE (RESTASIS) 0.05 % ophthalmic emulsion, Place 1 drop into both eyes as needed. (Patient not taking: Reported on 04/08/2023), Disp: , Rfl:    Polyethyl Glycol-Propyl Glycol (SYSTANE) 0.4-0.3 % GEL ophthalmic gel, Place 1 application. into both eyes at bedtime. (Patient not taking: Reported on 03/22/2023), Disp: , Rfl:   Observations/Objective: Today's Vitals   04/08/23 1346  Temp: (!) 97.2 F (36.2 C)  Weight: 157 lb (71.2 kg)  Height: 5\' 3"  (1.6 m)   Physical Exam  Assessment and Plan:  1. Medication side effect (Primary) -  She experienced significant adverse effects from cevimeline, including excessive sweating, frequent urination, incontinence, and dehydration, outweighing its benefits. -  Discontinue cevimeline.  2. Dry mouth -  Advise use of sugar-free throat lozenges. - Recommend using a humidifier at night. - Encourage regular sipping of water and ice chips. - Advise regular use of lip balm. - Ensure mouthwash is alcohol-free. - Advise chewing sugar-free gum. - Brush teeth at least twice a day.      Follow-up Awaiting follow-[place] mouth specialist [place]. - Schedule follow-up appointment with mouth specialist upon her return in two weeks.     Follow Up Instructions: Return if symptoms worsen or fail to improve.   I discussed the assessment and treatment plan with the patient. The patient was provided an opportunity to ask questions, and all were answered. The patient agreed with the plan and demonstrated an understanding of the instructions.   The patient was advised to call back or seek an in-person evaluation if the symptoms worsen or if the condition fails to improve as anticipated.  The above assessment and management plan was discussed with the patient. The patient  verbalized understanding of and has agreed to the management plan.   Kenard Gower, NP

## 2023-04-08 NOTE — Telephone Encounter (Signed)
 Copied from CRM [id number]. Topic: Clinical - Medical Advice >> Apr 08, 2023 11:04 AM Adrianna P wrote: Reason for CRM: Patient stated she was having a reaction to her medication, I asked what her symptoms was so I could tell the nurse and she refused to tell me her symptoms so she hung up.

## 2023-04-08 NOTE — Telephone Encounter (Signed)
 Had video visit today, 04/08/23. Discussed to discontinue taking Cevimeline due to reactions.

## 2023-04-08 NOTE — Telephone Encounter (Signed)
 Noted.

## 2023-04-08 NOTE — Telephone Encounter (Signed)
 Patient states that her reaction is to Cevimeline 30mg . Her reaction to this medication is burning during urination, sweating, and dehydration. She states that she doesn't want to keep going to [facility] for infusions. Patient refused in person office visit because she has things to do. Virtual visit scheduled at 2pm. Message routed to Ella Bodo, NP as Lorain Childes.

## 2023-04-12 NOTE — Telephone Encounter (Signed)
 See other phone note

## 2023-04-12 NOTE — Telephone Encounter (Signed)
 Patients appointment with Cardiology is 4/21 and her procedure is 5/8  She is on Plavix, this should still be enough time for her to come off he blood thinner before her procedure.   Pending Cardiology

## 2023-04-13 ENCOUNTER — Encounter: Payer: Self-pay | Admitting: *Deleted

## 2023-04-13 NOTE — Telephone Encounter (Signed)
 Thank you for reply. Can you provide the name of the lidocaine on the tube/bottle so we can update your medical record.

## 2023-04-19 ENCOUNTER — Ambulatory Visit (INDEPENDENT_AMBULATORY_CARE_PROVIDER_SITE_OTHER): Admitting: Sports Medicine

## 2023-04-19 ENCOUNTER — Encounter: Payer: Self-pay | Admitting: Sports Medicine

## 2023-04-19 VITALS — BP 116/72 | HR 71 | Temp 97.6°F | Resp 16 | Ht 63.0 in | Wt 157.0 lb

## 2023-04-19 DIAGNOSIS — R5383 Other fatigue: Secondary | ICD-10-CM | POA: Diagnosis not present

## 2023-04-19 DIAGNOSIS — R300 Dysuria: Secondary | ICD-10-CM

## 2023-04-19 DIAGNOSIS — R350 Frequency of micturition: Secondary | ICD-10-CM | POA: Diagnosis not present

## 2023-04-19 DIAGNOSIS — F5101 Primary insomnia: Secondary | ICD-10-CM

## 2023-04-19 DIAGNOSIS — S83241S Other tear of medial meniscus, current injury, right knee, sequela: Secondary | ICD-10-CM | POA: Diagnosis not present

## 2023-04-19 LAB — POCT URINALYSIS DIPSTICK
Bilirubin, UA: NEGATIVE
Blood, UA: NEGATIVE
Glucose, UA: NEGATIVE
Ketones, UA: POSITIVE
Nitrite, UA: NEGATIVE
Protein, UA: POSITIVE — AB
Spec Grav, UA: <=1.005 — AB (ref 1.010–1.025)
Urobilinogen, UA: NEGATIVE U/dL — AB
pH, UA: 5 (ref 5.0–8.0)

## 2023-04-19 MED ORDER — AMOXICILLIN-POT CLAVULANATE 500-125 MG PO TABS: 1.0000 | ORAL_TABLET | Freq: Three times a day (TID) | ORAL | 0 refills | Status: DC

## 2023-04-19 NOTE — Progress Notes (Unsigned)
 Careteam: Patient Care Team: [name], [name], NP as PCP - General (Internal Medicine) Clark-Burning, Victorino Dike, PA-C (Inactive) (Dermatology) Yates Decamp, MD as Consulting Physician (Cardiology) Mansouraty, Netty Starring., MD as Consulting Physician (Gastroenterology) Drema Halon, MD (Inactive) as Consulting Physician (Otolaryngology) Glyn Ade, PA-C as Physician Assistant (Dermatology) Plonk, Wilder Glade, MD as Referring Physician (Otolaryngology)  PLACE OF SERVICE:  [facility]  Advanced Directive information    Allergies  Allergen Reactions   Lactose Intolerance (Gi) Other (See Comments)    indigestion   Norco [Hydrocodone-Acetaminophen] Nausea And Vomiting   Dust Mite Extract Other (See Comments)    Sinus drainage   Oxycontin [Oxycodone Hcl] Nausea And Vomiting   Pollen Extract Other (See Comments)    Sinus drainage    No chief complaint on file.    Discussed the use of AI scribe software for clinical note transcription with the patient, who gave verbal consent to proceed.  History of Present Illness  UTI    Cough  Sneezing Using azelastine nasal spray  Does not want to take  antihistamines Follows with ENT    Fatigue - 1.5 yr  Review of Systems:  Review of Systems  Constitutional:  Negative for chills and fever.  HENT:  Negative for congestion and sore throat.   Eyes:  Negative for double vision.  Respiratory:  Negative for cough, sputum production and shortness of breath.   Cardiovascular:  Negative for chest pain, palpitations and leg swelling.  Gastrointestinal:  Negative for abdominal pain, constipation, diarrhea, heartburn and nausea.  Genitourinary:  Positive for dysuria and frequency. Negative for hematuria.  Musculoskeletal:  Positive for joint pain. Negative for falls and myalgias.  Neurological:  Negative for dizziness.   Negative unless indicated in HPI.   Past Medical History:  Diagnosis Date   Anxiety    Atypical mole  11/11/2020   Left Inner Knee (moderate-free)   Colon polyps    Depression    Hyperlipidemia    Hypertension    Hypothyroidism    Melanoma in situ (HCC) 09/19/2018   Right Arm (excision)   Skin cancer    Sleep apnea    Stroke [facility]) 2008   Past Surgical History:  Procedure Laterality Date   ADENOIDECTOMY     APPENDECTOMY  1960   CATARACT EXTRACTION Bilateral    COLONOSCOPY     GUM SURGERY  03/2019   LUMBAR DISC SURGERY  2014   MELANOMA EXCISION  2020   MESH APPLIED TO LAP PORT     SINUS EXPLORATION     11/17/2021 operation for right nasal cavity and 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   TONSILLECTOMY  1940   Social History:   reports that she has never smoked. She has never used smokeless tobacco. She reports that she does not currently use alcohol. She reports that she does not use drugs.  Family History  Problem Relation Age of Onset   Lung cancer Mother 24       smoker   Heart disease Mother    Stroke Father 41   Heart attack Father    Allergic rhinitis Sister    Hypertension Sister    CVA Brother    Lung cancer Maternal Grandfather    Heart attack Paternal Grandfather    Melanoma Daughter    Psoriasis Daughter    Rheum arthritis Daughter    Bipolar disorder Daughter    Colon cancer Neg Hx    Esophageal cancer Neg Hx  Inflammatory bowel disease Neg Hx    Liver disease Neg Hx    Pancreatic cancer Neg Hx    Stomach cancer Neg Hx    Rectal cancer Neg Hx     Medications: Patient's Medications  New Prescriptions   No medications on file  Previous Medications   AMMONIUM LACTATE (AMLACTIN) 12 % CREAM    Apply 1 Application topically as needed.   AMOXICILLIN-CLAVULANATE (AUGMENTIN) 875-125 MG TABLET    Take 1 tablet by mouth 2 (two) times daily.   AMOXICILLIN-CLAVULANATE (AUGMENTIN) 875-125 MG TABLET    Take 1 tablet by mouth 2 (two) times daily as needed.   ANTISEPTIC ORAL RINSE (BIOTENE) LIQD    15 mLs by Mouth Rinse route  daily.   AZELASTINE HCL 137 MCG/SPRAY SOLN    USE 1 TO 2 SPRAYS IN EACH NOSTRIL 1-2 TIMES A DAY AS NEEDED FOR RUNNY NOSE/DRAINAGE DOWN THROAT   CALCIUM CARBONATE (TUMS - DOSED IN MG ELEMENTAL CALCIUM) 500 MG CHEWABLE TABLET    Chew 1 tablet by mouth as needed for indigestion or heartburn.   CEVIMELINE (EVOXAC) 30 MG CAPSULE    Take 30 mg by mouth 3 (three) times daily as needed.   CLOPIDOGREL (PLAVIX) 75 MG TABLET    Take 1 tablet (75 mg total) by mouth daily.   CYCLOSPORINE (RESTASIS) 0.05 % OPHTHALMIC EMULSION    Place 1 drop into both eyes as needed.   FLUTICASONE (FLONASE) 50 MCG/ACT NASAL SPRAY    INSTILL 1-2 SPRAYS INTO EACH NOSTRIL DAILY   IBUPROFEN (ADVIL) 400 MG TABLET    Take 400 mg by mouth every 6 (six) hours as needed for moderate pain (pain score 4-6).   KETOCONAZOLE (NIZORAL) 2 % SHAMPOO    APPLY TOPICALLY 2 TIMES A WEEK   LOSARTAN (COZAAR) 50 MG TABLET    Take 1 tablet (50 mg total) by mouth daily.   NITROGLYCERIN (NITROSTAT) 0.4 MG SL TABLET    Place 1 tablet (0.4 mg total) under the tongue every 5 (five) minutes as needed for up to 25 days for chest pain.   OMEPRAZOLE (PRILOSEC) 40 MG CAPSULE    Take 1 capsule (40 mg total) by mouth as needed.   OXYMETAZOLINE (AFRIN NASAL SPRAY) 0.05 % NASAL SPRAY    Place 1 spray into both nostrils 2 (two) times daily.   POLYETHYL GLYCOL-PROPYL GLYCOL (SYSTANE) 0.4-0.3 % GEL OPHTHALMIC GEL    Place 1 application  into both eyes at bedtime.   PRAVASTATIN (PRAVACHOL) 40 MG TABLET    TAKE 1 TABLET BY MOUTH EVERY DAY   SYNTHROID 100 MCG TABLET    TAKE 1 TABLET BY MOUTH EVERY DAY BEFORE BREAKFAST   UNABLE TO FIND    Take by mouth as directed. Please compound Pilocarpine 4 mg/ml Oral Rinse (with 30% Mucolox). PATIENT INSTRUCTIONS: For dry mouth, swish and spit 2 to 4 mL for 1 to 2 minutes. DO NOT SWALLOW. May repeat every 6 hours as needed for a maximum of 3 times daily   VITAMIN D, ERGOCALCIFEROL, (DRISDOL) 1.25 MG (50000 UNIT) CAPS CAPSULE    TAKE 1  CAPSULE BY MOUTH ONE TIME PER WEEK   ZOLPIDEM TARTRATE 3.5 MG SUBL    TAKE 1 TABLET BY MOUTH EVERY DAY AT BEDTIME AS NEEDED  Modified Medications   No medications on file  Discontinued Medications   No medications on file    Physical Exam: Vitals:   04/19/23 1503  BP: 116/72  Pulse: 71  Resp: 16  Temp: 97.6 F (36.4 C)  SpO2: 98%  Weight: 157 lb (71.2 kg)  Height: 5\' 3"  (1.6 m)   Body mass index is 27.81 kg/m. BP Readings from Last 3 Encounters:  04/19/23 116/72  03/31/23 122/78  03/22/23 126/78   Wt Readings from Last 3 Encounters:  04/19/23 157 lb (71.2 kg)  04/08/23 157 lb (71.2 kg)  03/31/23 157 lb (71.2 kg)    Physical Exam Constitutional:      Appearance: Normal appearance.  HENT:     Head: Normocephalic and atraumatic.  Cardiovascular:     Rate and Rhythm: Normal rate and regular rhythm.  Pulmonary:     Effort: Pulmonary effort is normal. No respiratory distress.     Breath sounds: Normal breath sounds. No wheezing.  Abdominal:     General: Bowel sounds are normal. There is no distension.     Tenderness: There is no abdominal tenderness. There is no guarding or rebound.     Comments:    Musculoskeletal:        General: No swelling or tenderness.     Comments: No redness , no swelling  Joint line tenderness+  Neurological:     Mental Status: She is alert. Mental status is at baseline.     Motor: No weakness.     Labs reviewed: Basic Metabolic Panel: Recent Labs    06/15/22 1706 07/21/22 0522 08/03/22 0729 09/16/22 1510 03/14/23 1005  NA 141 140  --  139  --   K 3.9 3.7  --  4.0  --   CL 108 107  --  106  --   CO2 26 23  --  26  --   GLUCOSE 102* 117*  --  119*  --   BUN 19 17  --  14  --   CREATININE 0.81 0.69  --  0.65  --   CALCIUM 9.1 9.3  --  9.3  --   TSH  --   --  0.17*  --  0.77   Liver Function Tests: Recent Labs    06/15/22 1706 07/21/22 0522  AST 18 18  ALT 17 16  ALKPHOS 58 67  BILITOT 0.5 0.7  PROT 6.7 6.7   ALBUMIN 3.7 3.9   Recent Labs    06/15/22 1706  LIPASE 48   No results for input(s): "AMMONIA" in the last 8760 hours. CBC: Recent Labs    06/15/22 1706 07/21/22 0522 09/16/22 1510  WBC 7.0 6.3 9.7  NEUTROABS 4.2 3.5  --   HGB 13.6 13.7 14.3  HCT 40.6 41.4 43.9  MCV 90.0 90.4 91.1  PLT 243 243 247   Lipid Panel: No results for input(s): "CHOL", "HDL", "LDLCALC", "TRIG", "CHOLHDL", "LDLDIRECT" in the last 8760 hours. TSH: Recent Labs    08/03/22 0729 03/14/23 1005  TSH 0.17* 0.77   A1C: Lab Results  Component Value Date   HGBA1C 6.1 (H) 06/07/2021    Assessment and Plan Assessment & Plan       No follow-ups on file.:   Darlene [name]

## 2023-04-20 ENCOUNTER — Encounter: Payer: Self-pay | Admitting: Sports Medicine

## 2023-04-20 LAB — CBC
HCT: 43.6 % (ref 35.0–45.0)
Hemoglobin: 14.7 g/dL (ref 11.7–15.5)
MCH: 29.7 pg (ref 27.0–33.0)
MCHC: 33.7 g/dL (ref 32.0–36.0)
MCV: 88.1 fL (ref 80.0–100.0)
MPV: 11.5 fL (ref 7.5–12.5)
Platelets: 275 10*3/uL (ref 140–400)
RBC: 4.95 10*6/uL (ref 3.80–5.10)
RDW: 12.4 % (ref 11.0–15.0)
WBC: 7 10*3/uL (ref 3.8–10.8)

## 2023-04-20 LAB — BASIC METABOLIC PANEL WITH GFR
BUN: 17 mg/dL (ref 7–25)
CO2: 26 mmol/L (ref 20–32)
Calcium: 10 mg/dL (ref 8.6–10.4)
Chloride: 106 mmol/L (ref 98–110)
Creat: 0.75 mg/dL (ref 0.60–0.95)
Glucose, Bld: 87 mg/dL (ref 65–99)
Potassium: 4.3 mmol/L (ref 3.5–5.3)
Sodium: 142 mmol/L (ref 135–146)
eGFR: 77 mL/min/{1.73_m2} (ref >=60)

## 2023-04-20 LAB — URINE CULTURE
MICRO NUMBER:: 16275051
Result:: NO GROWTH
SPECIMEN QUALITY:: ADEQUATE

## 2023-04-22 ENCOUNTER — Telehealth: Payer: Self-pay | Admitting: Physician Assistant

## 2023-04-22 ENCOUNTER — Other Ambulatory Visit: Payer: Self-pay | Admitting: Nurse Practitioner

## 2023-04-22 NOTE — Telephone Encounter (Signed)
 Patient called and stated that she has a procedure schedule for May the 8 th at the hospital and was wondering if she can get a call back to go over her prep instruction for the procedure. Patient stated that she would like a call back today if possible. Please advise.

## 2023-04-22 NOTE — Telephone Encounter (Signed)
 Patient is returning your call.

## 2023-04-22 NOTE — Telephone Encounter (Signed)
 I spoke with the pt and she wanted to confirm her appt date and time. She does have her instructions and reviewed with me over the phone.  She did verbalize understanding and no further questions.

## 2023-04-22 NOTE — Telephone Encounter (Signed)
 Left message on machine to call back

## 2023-04-28 ENCOUNTER — Ambulatory Visit: Admitting: Nurse Practitioner

## 2023-04-28 ENCOUNTER — Encounter: Payer: Self-pay | Admitting: Nurse Practitioner

## 2023-04-28 VITALS — BP 118/62 | HR 67 | Temp 97.9°F | Resp 17 | Ht 63.0 in | Wt 157.4 lb

## 2023-04-28 DIAGNOSIS — R350 Frequency of micturition: Secondary | ICD-10-CM

## 2023-04-28 DIAGNOSIS — E039 Hypothyroidism, unspecified: Secondary | ICD-10-CM | POA: Diagnosis not present

## 2023-04-28 DIAGNOSIS — M150 Primary generalized (osteo)arthritis: Secondary | ICD-10-CM | POA: Diagnosis not present

## 2023-04-28 DIAGNOSIS — I10 Essential (primary) hypertension: Secondary | ICD-10-CM | POA: Diagnosis not present

## 2023-04-28 DIAGNOSIS — R32 Unspecified urinary incontinence: Secondary | ICD-10-CM

## 2023-04-28 DIAGNOSIS — K449 Diaphragmatic hernia without obstruction or gangrene: Secondary | ICD-10-CM

## 2023-04-28 MED ORDER — NYSTATIN 100000 UNIT/GM EX CREA
1.0000 | TOPICAL_CREAM | Freq: Two times a day (BID) | CUTANEOUS | 3 refills | Status: DC
Start: 2023-04-28 — End: 2023-05-25

## 2023-04-28 MED ORDER — HYDROCORTISONE 1 % EX LOTN: 1.0000 | TOPICAL_LOTION | Freq: Two times a day (BID) | CUTANEOUS | 3 refills | Status: DC

## 2023-04-28 NOTE — Assessment & Plan Note (Signed)
 blood pressure is controlled on Losartan qd. added Imdur 30mg  tid by Cardiology. Followed by Cardiology. CTA negative 09/01/20. Bun/creat 17/0.75 04/19/23.

## 2023-04-28 NOTE — Progress Notes (Signed)
 Location:   [facility]   Place of Service:    Provider: Chipper Oman NP  Senia Even X, NP  Patient Care Team: Elisavet Buehrer X, NP as PCP - General (Internal Medicine) Clark-Burning, Victorino Dike, PA-C (Inactive) (Dermatology) Yates Decamp, MD as Consulting Physician (Cardiology) Mansouraty, Netty Starring., MD as Consulting Physician (Gastroenterology) Drema Halon, MD (Inactive) as Consulting Physician (Otolaryngology) Glyn Ade, PA-C as Physician Assistant (Dermatology) Plonk, Wilder Glade, MD as Referring Physician (Otolaryngology)  Extended Emergency Contact Information Primary Emergency Contact: [facility] Phone: [phone] Relation: Son Secondary Emergency Contact: William Dalton of [place] Home Phone: [phone] Mobile Phone: [phone] Relation: Son  Code Status:  DNR Goals of care: Advanced Directive information    04/28/2023    3:16 PM  Advanced Directives  Does Patient Have a Medical Advance Directive? Yes  Type of Estate agent of [place];Living will  Does patient want to make changes to medical advance directive? No - Patient declined  Copy of Healthcare Power of Attorney in Chart? No - copy requested     Chief Complaint  Patient presents with   Acute Visit    Still having UTI issues.    HPI:  Pt is a 89 y.o. female seen today for medical management of chronic diseases.       Dry mouth, cevimeline, cause frequent BM, night sweat, fatigue, wants to eliminate Ambien to avoid day time sleepiness.               Resolved pain allover after immunotherapy infusion,, cancer center recommended Tylenol up to 3000mg  a day for pain.                          After 2nd Shingrix, feels aches, headache, more nasal/ear congestion.    07/21/22 ED eval, dehydration, gum swelling             Malignant melanoma of nasal cavity, followed by oncology, ENT, dental team, remission.              Anxiety, chronic               Sleeps too much during day, failed dc Ambien in the past,  off Valium, refused CPAP, feels a veil in front of left eye, more noticeable left facial weakness Hx of chronic rhinitis/sphenoidal sinusitis, s/p surgery, improved SOB, wheezing, but still has nasal/right ear congestion, on Astelin, Flonase, Bronchodilator, underwent Pulmonology, ENT, GI, Allergy evaluation, had spirometry 10/20/20, Echo 60-65% EF 04/16/20, CTA 09/01/20. Saw ENT             Chronic sore throat: multiple factorials, GERD vs chronic rhinitis/sinusitis,  ENT  Insomnia trial of dc Ambien             Hx of CVA, on plavix. Statin,  residual of  poor left peripheral vision  Hypothyroidism, on Levothyroxine-declined dose adjustment.  TSH 0.168 02/18/22, 0.77 03/14/23             HTN, blood pressure is controlled on Losartan qd. added Imdur 30mg  tid by Cardiology. Followed by Cardiology. CTA negative 09/01/20. Bun/creat 17/0.75 04/19/23.            GERD, stable Omeprazole, FDgard helped,  saw GI, EGD 12/26/19. Vit B12 383 12/02/20, Hgb 14.7 04/19/23, repeat EGD 05/26/23             Lactose intolerance, diet adjustment, f/u GI  Esophageal dysmotility, Swallow studay 05/12/20, worked with ST, GI f/u             OSA sleep study, saw Pulmonology.  Not using CPAP                                     OA, saw Ortho. Cervical spinal stenosis, MR 02/26/20, C6-C7. Delaying  R mensicus tender surgery, Tramadol, Advil.              Hyperlipidemia, takes Pravastatin, LDL 94 05/12/21             Urinary frequency/leakage, 2-3x/night, stopped taking Gemtesa, acupuncture improved initially. Saw urology. treated with Augmentin 04/19/23, urine culture showed no growth 04/19/23, persisted with symptoms             Osteopenia, DEXA 12/25/20, t score -1.0,  10 years possibility major fx 11%, hip fx 2.6%. Vit D, Cal-diet rich,  Vit D 3 39 12/26/20             HOH audiology eval, ENT placed tube in the right ear-failed-again, hearing aids Past Medical History:   Diagnosis Date   Anxiety    Atypical mole 11/11/2020   Left Inner Knee (moderate-free)   Colon polyps    Depression    Hyperlipidemia    Hypertension    Hypothyroidism    Melanoma in situ (HCC) 09/19/2018   Right Arm (excision)   Skin cancer    Sleep apnea    Stroke [facility]) 2008   Past Surgical History:  Procedure Laterality Date   ADENOIDECTOMY     APPENDECTOMY  1960   CATARACT EXTRACTION Bilateral    COLONOSCOPY     GUM SURGERY  03/2019   LUMBAR DISC SURGERY  2014   MELANOMA EXCISION  2020   MESH APPLIED TO LAP PORT     SINUS EXPLORATION     11/17/2021 operation for right nasal cavity and 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   TONSILLECTOMY  1940    Allergies  Allergen Reactions   Lactose Intolerance (Gi) Other (See Comments)    indigestion   Norco [Hydrocodone-Acetaminophen] Nausea And Vomiting   Dust Mite Extract Other (See Comments)    Sinus drainage   Oxycontin [Oxycodone Hcl] Nausea And Vomiting   Pollen Extract Other (See Comments)    Sinus drainage    Allergies as of 04/28/2023       Reactions   Lactose Intolerance (gi) Other (See Comments)   indigestion   Norco [hydrocodone-acetaminophen] Nausea And Vomiting   Dust Mite Extract Other (See Comments)   Sinus drainage   Oxycontin [oxycodone Hcl] Nausea And Vomiting   Pollen Extract Other (See Comments)   Sinus drainage        Medication List        Accurate as of April 28, 2023  3:54 PM. If you have any questions, ask your nurse or doctor.          ammonium lactate 12 % cream Commonly known as: AMLACTIN Apply 1 Application topically as needed.   amoxicillin-clavulanate 875-125 MG tablet Commonly known as: AUGMENTIN Take 1 tablet by mouth 2 (two) times daily as needed.   amoxicillin-clavulanate 500-125 MG tablet Commonly known as: Augmentin Take 1 tablet by mouth 3 (three) times daily.   antiseptic oral rinse Liqd 15 mLs by Mouth Rinse route daily.    Azelastine HCl 137 MCG/SPRAY Soln  USE 1 TO 2 SPRAYS IN EACH NOSTRIL 1-2 TIMES A DAY AS NEEDED FOR RUNNY NOSE/DRAINAGE DOWN THROAT   calcium carbonate 500 MG chewable tablet Commonly known as: TUMS - dosed in mg elemental calcium Chew 1 tablet by mouth as needed for indigestion or heartburn.   cevimeline 30 MG capsule Commonly known as: EVOXAC Take 30 mg by mouth 3 (three) times daily as needed.   clopidogrel 75 MG tablet Commonly known as: PLAVIX Take 1 tablet (75 mg total) by mouth daily.   cycloSPORINE 0.05 % ophthalmic emulsion Commonly known as: RESTASIS Place 1 drop into both eyes as needed.   fluticasone 50 MCG/ACT nasal spray Commonly known as: FLONASE INSTILL 1-2 SPRAYS INTO EACH NOSTRIL DAILY   hydrocortisone 1 % lotion Apply 1 Application topically 2 (two) times daily. Started by: Uzair Godley X Adley Castello   ibuprofen 400 MG tablet Commonly known as: ADVIL Take 400 mg by mouth every 6 (six) hours as needed for moderate pain (pain score 4-6).   ketoconazole 2 % shampoo Commonly known as: NIZORAL APPLY TOPICALLY 2 TIMES A WEEK   losartan 50 MG tablet Commonly known as: COZAAR Take 1 tablet (50 mg total) by mouth daily.   nitroGLYCERIN 0.4 MG SL tablet Commonly known as: NITROSTAT Place 1 tablet (0.4 mg total) under the tongue every 5 (five) minutes as needed for up to 25 days for chest pain.   nystatin cream Commonly known as: MYCOSTATIN Apply 1 Application topically 2 (two) times daily. Started by: Janalynn Eder X Boots Mcglown   omeprazole 40 MG capsule Commonly known as: PRILOSEC Take 1 capsule (40 mg total) by mouth as needed.   oxymetazoline 0.05 % nasal spray Commonly known as: Afrin Nasal Spray Place 1 spray into both nostrils 2 (two) times daily.   pravastatin 40 MG tablet Commonly known as: PRAVACHOL TAKE 1 TABLET BY MOUTH EVERY DAY   Synthroid 100 MCG tablet Generic drug: levothyroxine TAKE 1 TABLET BY MOUTH EVERY DAY BEFORE BREAKFAST   Systane 0.4-0.3 % Gel  ophthalmic gel Generic drug: Polyethyl Glycol-Propyl Glycol Place 1 application  into both eyes at bedtime.   UNABLE TO FIND Take by mouth as directed. Please compound Pilocarpine 4 mg/ml Oral Rinse (with 30% Mucolox). PATIENT INSTRUCTIONS: For dry mouth, swish and spit 2 to 4 mL for 1 to 2 minutes. DO NOT SWALLOW. May repeat every 6 hours as needed for a maximum of 3 times daily   Vitamin D (Ergocalciferol) 1.25 MG (50000 UNIT) Caps capsule Commonly known as: DRISDOL TAKE 1 CAPSULE BY MOUTH ONE TIME PER WEEK   Zolpidem Tartrate 3.5 MG Subl TAKE 1 TABLET BY MOUTH EVERY DAY AT BEDTIME AS NEEDED        Review of Systems  Constitutional:  Positive for fatigue. Negative for appetite change and fever.  HENT:  Positive for congestion and hearing loss. Negative for nosebleeds, sinus pressure, sore throat and voice change.        Chronic sinusitis, underwent ENT, sleep study-sleep apnea.  Eyes:  Positive for visual disturbance.       Left lateral visual field deficit. Dry eyes  Respiratory:  Negative for cough and shortness of breath.   Cardiovascular:  Negative for leg swelling.  Gastrointestinal:  Negative for abdominal pain and constipation.  Genitourinary:  Positive for dysuria and frequency. Negative for hematuria and urgency.       Urinary leakage, urinary frequency 2-3x/night. Doing Kegel exercise.   Musculoskeletal:  Positive for arthralgias. Negative for back pain and gait  problem.       Aches allover  Skin:  Negative for color change.       Mohs dorsum right hand, healed.   Neurological:  Negative for speech difficulty, weakness and headaches.  Psychiatric/Behavioral:  Positive for sleep disturbance. Negative for dysphoric mood. The patient is not nervous/anxious.        Early am awake, difficulty returning asleep.     Immunization History  Administered Date(s) Administered   Hepatitis A 04/04/2000   Influenza-Unspecified 10/18/2014   Moderna Sars-Covid-2 Vaccination  01/22/2019, 02/19/2019, 06/05/2020   Pneumococcal Conjugate-13 12/20/2018   Pneumococcal Polysaccharide-23 01/18/2005   Tdap 11/25/2008   Zoster Recombinant(Shingrix) 12/20/2018, 01/20/2022, 09/15/2022   Pertinent  Health Maintenance Due  Topic Date Due   Colonoscopy  10/25/2021   INFLUENZA VACCINE  08/19/2023   DEXA SCAN  Completed      09/30/2022    2:49 PM 11/30/2022   11:22 AM 12/09/2022    3:27 PM 12/30/2022   12:59 PM 04/19/2023    3:14 PM  Fall Risk  Falls in the past year? 0 0 0 0 0  Was there an injury with Fall? 0 0 0 0 0  Fall Risk Category Calculator 0 0 0 0 0  Patient at Risk for Falls Due to No Fall Risks No Fall Risks   No Fall Risks  Fall risk Follow up Falls evaluation completed Falls evaluation completed;Education provided;Falls prevention discussed   Falls evaluation completed   Functional Status Survey:    Vitals:   04/28/23 1525  BP: 118/62  Pulse: 67  Resp: 17  Temp: 97.9 F (36.6 C)  SpO2: 95%  Weight: 157 lb 6.4 oz (71.4 kg)  Height: 5\' 3"  (1.6 m)   Body mass index is 27.88 kg/m. Physical Exam Vitals and nursing note reviewed.  Constitutional:      Appearance: Normal appearance.  HENT:     Head: Normocephalic and atraumatic.     Ears:     Comments: Right ear tube placed, mild erythema, no bleeding.     Nose: Nose normal.     Mouth/Throat:     Mouth: Mucous membranes are moist.  Eyes:     Extraocular Movements: Extraocular movements intact.     Conjunctiva/sclera: Conjunctivae normal.     Pupils: Pupils are equal, round, and reactive to light.     Comments: Left peripheral visual field deficit since CVA 2006  Cardiovascular:     Rate and Rhythm: Normal rate and regular rhythm.     Heart sounds: No murmur heard. Pulmonary:     Effort: Pulmonary effort is normal.     Breath sounds: Rales present.     Comments: Bibasilar rales.  Abdominal:     General: Bowel sounds are normal.     Palpations: Abdomen is soft.     Tenderness: There  is no abdominal tenderness. There is no right CVA tenderness, left CVA tenderness, guarding or rebound.  Musculoskeletal:        General: Tenderness present.     Cervical back: Normal range of motion and neck supple.     Right lower leg: No edema.     Left lower leg: No edema.     Comments: Aches allover, R knee pain when standing, no s/s of infection or injury.   Skin:    General: Skin is warm and dry.  Neurological:     General: No focal deficit present.     Mental Status: She is alert and oriented to  person, place, and time. Mental status is at baseline.     Motor: Weakness present.     Gait: Gait normal.     Comments: Left facial weakness  Psychiatric:        Mood and Affect: Mood normal.        Behavior: Behavior normal.        Thought Content: Thought content normal.        Judgment: Judgment normal.     Labs reviewed: Recent Labs    07/21/22 0522 09/16/22 1510 04/19/23 1546  NA 140 139 142  K 3.7 4.0 4.3  CL 107 106 106  CO2 23 26 26   GLUCOSE 117* 119* 87  BUN 17 14 17   CREATININE 0.69 0.65 0.75  CALCIUM 9.3 9.3 10.0   Recent Labs    06/15/22 1706 07/21/22 0522  AST 18 18  ALT 17 16  ALKPHOS 58 67  BILITOT 0.5 0.7  PROT 6.7 6.7  ALBUMIN 3.7 3.9   Recent Labs    06/15/22 1706 07/21/22 0522 09/16/22 1510 04/19/23 1546  WBC 7.0 6.3 9.7 7.0  NEUTROABS 4.2 3.5  --   --   HGB 13.6 13.7 14.3 14.7  HCT 40.6 41.4 43.9 43.6  MCV 90.0 90.4 91.1 88.1  PLT 243 243 247 275   Lab Results  Component Value Date   TSH 0.77 03/14/2023   Lab Results  Component Value Date   HGBA1C 6.1 (H) 06/07/2021   Lab Results  Component Value Date   CHOL 163 05/12/2021   HDL 44 05/12/2021   LDLCALC 94 05/12/2021   TRIG 144 05/12/2021   CHOLHDL 3.5 12/26/2019    Significant Diagnostic Results in last 30 days:  No results found.  Assessment/Plan  Hypothyroidism on Levothyroxine-declined dose adjustment.  TSH 0.168 02/18/22, 0.77 03/14/23  Essential  hypertension  blood pressure is controlled on Losartan qd. added Imdur 30mg  tid by Cardiology. Followed by Cardiology. CTA negative 09/01/20. Bun/creat 17/0.75 04/19/23.  Hiatal hernia  stable Omeprazole, FDgard helped,  saw GI, EGD 12/26/19. Vit B12 383 12/02/20, Hgb 14.7 04/19/23, repeat EGD 05/26/23  Osteoarthritis, multiple sites  saw Ortho. Cervical spinal stenosis, MR 02/26/20, C6-C7. Delaying  R mensicus tender surgery, Tramadol, Advil.   Incontinent of urine Urinary frequency/leakage, 2-3x/night, stopped taking Gemtesa, acupuncture improved initially. Saw urology. treated with Augmentin 04/19/23, urine culture showed no growth 04/19/23, persisted with symptoms of urinary frequency, urgency, incontinence, external urogenital area itching/irritation Will try 1% hydrocortisone cream mix with Nystatin cream bid prn.     Family/ staff Communication: plan of care reviewed with the patient   Labs/tests ordered:  none  F/u prn.

## 2023-04-28 NOTE — Assessment & Plan Note (Signed)
 stable Omeprazole, FDgard helped,  saw GI, EGD 12/26/19. Vit B12 383 12/02/20, Hgb 14.7 04/19/23, repeat EGD 05/26/23

## 2023-04-28 NOTE — Assessment & Plan Note (Addendum)
 Urinary frequency/leakage, 2-3x/night, stopped taking Gemtesa, acupuncture improved initially. Saw urology. treated with Augmentin 04/19/23, urine culture showed no growth 04/19/23, persisted with symptoms of urinary frequency, urgency, incontinence, external urogenital area itching/irritation Will try 1% hydrocortisone cream mix with Nystatin cream bid prn.

## 2023-04-28 NOTE — Assessment & Plan Note (Signed)
 on Levothyroxine-declined dose adjustment.  TSH 0.168 02/18/22, 0.77 03/14/23

## 2023-04-28 NOTE — Assessment & Plan Note (Signed)
 saw Ortho. Cervical spinal stenosis, MR 02/26/20, C6-C7. Delaying  R mensicus tender surgery, Tramadol, Advil.

## 2023-05-02 ENCOUNTER — Telehealth: Payer: Self-pay | Admitting: Cardiology

## 2023-05-02 NOTE — Telephone Encounter (Signed)
 Patient wants a call back from Pre-op to discuss clearance appointment.  Patient stated can leave message on voicemail.

## 2023-05-02 NOTE — Telephone Encounter (Signed)
 Spoke with patient who explained that she would like to see Dr. Berry Bristol in person and closer to her surgery date instead of the a tele visit pre-op appointment. Per Deatra Face, Dr. Berry Bristol nurse okay to schedule patient on 5/2 at 11:40 am. Patient verbalized understanding and thanked me for calling.

## 2023-05-04 ENCOUNTER — Other Ambulatory Visit (INDEPENDENT_AMBULATORY_CARE_PROVIDER_SITE_OTHER): Payer: Self-pay

## 2023-05-04 ENCOUNTER — Ambulatory Visit: Admitting: Orthopedic Surgery

## 2023-05-04 DIAGNOSIS — M25562 Pain in left knee: Secondary | ICD-10-CM

## 2023-05-04 DIAGNOSIS — M25561 Pain in right knee: Secondary | ICD-10-CM | POA: Diagnosis not present

## 2023-05-06 ENCOUNTER — Encounter: Payer: Self-pay | Admitting: Orthopedic Surgery

## 2023-05-06 ENCOUNTER — Emergency Department (HOSPITAL_COMMUNITY)

## 2023-05-06 ENCOUNTER — Encounter (HOSPITAL_COMMUNITY): Payer: Self-pay

## 2023-05-06 ENCOUNTER — Telehealth: Payer: Self-pay | Admitting: Gastroenterology

## 2023-05-06 ENCOUNTER — Emergency Department (HOSPITAL_COMMUNITY)
Admission: EM | Admit: 2023-05-06 | Discharge: 2023-05-06 | Disposition: A | Discharge status: Home / Self Care | Attending: Emergency Medicine | Admitting: Emergency Medicine

## 2023-05-06 ENCOUNTER — Other Ambulatory Visit: Payer: Self-pay

## 2023-05-06 DIAGNOSIS — R0602 Shortness of breath: Secondary | ICD-10-CM | POA: Insufficient documentation

## 2023-05-06 DIAGNOSIS — R053 Chronic cough: Secondary | ICD-10-CM | POA: Diagnosis not present

## 2023-05-06 LAB — COMPREHENSIVE METABOLIC PANEL WITH GFR
ALT: 14 U/L (ref 0–44)
AST: 16 U/L (ref 15–41)
Albumin: 3.6 g/dL (ref 3.5–5.0)
Alkaline Phosphatase: 60 U/L (ref 38–126)
Anion gap: 8 (ref 5–15)
BUN: 15 mg/dL (ref 8–23)
CO2: 23 mmol/L (ref 22–32)
Calcium: 9.5 mg/dL (ref 8.9–10.3)
Chloride: 111 mmol/L (ref 98–111)
Creatinine, Ser: 0.45 mg/dL (ref 0.44–1.00)
GFR, Estimated: >60 mL/min (ref >60)
Glucose, Bld: 116 mg/dL — ABNORMAL HIGH (ref 70–99)
Potassium: 3.9 mmol/L (ref 3.5–5.1)
Sodium: 142 mmol/L (ref 135–145)
Total Bilirubin: 0.7 mg/dL (ref 0.0–1.2)
Total Protein: 6.3 g/dL — ABNORMAL LOW (ref 6.5–8.1)

## 2023-05-06 LAB — CBC WITH DIFFERENTIAL/PLATELET
Abs Immature Granulocytes: 0.01 10*3/uL (ref 0.00–0.07)
Basophils Absolute: 0.1 10*3/uL (ref 0.0–0.1)
Basophils Relative: 1 %
Eosinophils Absolute: 0.2 10*3/uL (ref 0.0–0.5)
Eosinophils Relative: 4 %
HCT: 42.9 % (ref 36.0–46.0)
Hemoglobin: 13.8 g/dL (ref 12.0–15.0)
Immature Granulocytes: 0 %
Lymphocytes Relative: 34 %
Lymphs Abs: 1.9 10*3/uL (ref 0.7–4.0)
MCH: 29.6 pg (ref 26.0–34.0)
MCHC: 32.2 g/dL (ref 30.0–36.0)
MCV: 92.1 fL (ref 80.0–100.0)
Monocytes Absolute: 0.4 10*3/uL (ref 0.1–1.0)
Monocytes Relative: 7 %
Neutro Abs: 3 10*3/uL (ref 1.7–7.7)
Neutrophils Relative %: 54 %
Platelets: 231 10*3/uL (ref 150–400)
RBC: 4.66 MIL/uL (ref 3.87–5.11)
RDW: 12.8 % (ref 11.5–15.5)
WBC: 5.6 10*3/uL (ref 4.0–10.5)
nRBC: 0 % (ref 0.0–0.2)

## 2023-05-06 LAB — RESP PANEL BY RT-PCR (RSV, FLU A&B, COVID)  RVPGX2
Influenza A by PCR: NEGATIVE
Influenza B by PCR: NEGATIVE
Resp Syncytial Virus by PCR: NEGATIVE
SARS Coronavirus 2 by RT PCR: NEGATIVE

## 2023-05-06 LAB — TROPONIN I (HIGH SENSITIVITY): Troponin I (High Sensitivity): 3 ng/L (ref <18)

## 2023-05-06 LAB — BRAIN NATRIURETIC PEPTIDE: B Natriuretic Peptide: 64 pg/mL (ref 0.0–100.0)

## 2023-05-06 MED ORDER — AZELASTINE HCL 0.1 % NA SOLN
2.0000 | Freq: Once | NASAL | Status: DC
  Filled 2023-05-06: qty 30

## 2023-05-06 MED ORDER — SALINE SPRAY 0.65 % NA SOLN
1.0000 | NASAL | Status: DC | PRN
  Filled 2023-05-06: qty 44

## 2023-05-06 NOTE — ED Provider Notes (Signed)
 [place] [facility]ENCY DEPARTMENT AT [facility] Provider Note   CSN: [id number] Arrival date & time: 05/06/23  4098     History  Chief Complaint  Patient presents with   Shortness of Breath    SHOb onset today, denies fever. Has hx of chronic sinus issues and pt reports it is flaring up today and that woke her up.     Darlene [name] is a 89 y.o. female.  HPI 89 year old female presents with shortness of breath.  Patient has chronic issues with her sinuses and throat.  This is at least partially attributed to GERD.  She follows with Dr. Brice Campi.  She is due to have an EGD at the beginning of May but feels like her symptoms are worsening.  She chronically has ear fullness as well as a feeling of a bubble in her throat.  She typically takes azelastine  and Afrin which will both help her symptoms.  She also uses lean nasal sprays.  This morning around 8:30 AM she was awoken by trouble breathing and her chronic symptoms seemed worse.  She has never been woken up with dyspnea from this before.  She denies any new cough but does have a chronic cough associated with the symptoms.  She denies any fever, chest pain, leg swelling.  Her symptoms are mildly improving since onset.  EMS reports that her vital signs were unremarkable including normal O2 sats on room air.  Home Medications Prior to Admission medications   Medication Sig Start Date End Date Taking? Authorizing Provider  ammonium lactate (AMLACTIN) 12 % cream Apply 1 Application topically as needed. 04/14/23   [provider]  clopidogrel  (PLAVIX ) 75 MG tablet Take 1 tablet (75 mg total) by mouth daily. 01/20/23   [name], [name], NP  fluticasone  (FLONASE ) 50 MCG/ACT nasal spray INSTILL 1-2 SPRAYS INTO EACH NOSTRIL DAILY 02/01/23   [name], [name], NP  hydrocortisone  1 % lotion Apply 1 Application topically 2 (two) times daily. 04/28/23   [name], [name], NP  ibuprofen (ADVIL) 400 MG tablet Take 400 mg by mouth every 6 (six) hours as  needed for moderate pain (pain score 4-6).    [provider]  ketoconazole  (NIZORAL ) 2 % shampoo APPLY TOPICALLY 2 TIMES A WEEK 02/28/23   [name], [name], NP  losartan  (COZAAR ) 50 MG tablet Take 1 tablet (50 mg total) by mouth daily. 12/21/22 12/10/24  [name], [name], NP  nitroGLYCERIN  (NITROSTAT ) 0.4 MG SL tablet Place 1 tablet (0.4 mg total) under the tongue every 5 (five) minutes as needed for up to 25 days for chest pain. 12/21/21 01/19/48  Tanis Fan, NP  nystatin  cream (MYCOSTATIN ) Apply 1 Application topically 2 (two) times daily. 04/28/23   [name], [name], NP  omeprazole  (PRILOSEC) 40 MG capsule Take 1 capsule (40 mg total) by mouth as needed. 03/22/23   Edmonia Gottron, PA-C  oxymetazoline  (AFRIN NASAL SPRAY) 0.05 % nasal spray Place 1 spray into both nostrils 2 (two) times daily. 11/30/22   Tye Gall, MD  pravastatin  (PRAVACHOL ) 40 MG tablet TAKE 1 TABLET BY MOUTH EVERY DAY 03/29/23   Knox Perl, MD  SYNTHROID  100 MCG tablet TAKE 1 TABLET BY MOUTH EVERY DAY BEFORE BREAKFAST 10/25/22   [name], [name], NP  UNABLE TO FIND Take by mouth as directed. Please compound Pilocarpine 4 mg/ml Oral Rinse (with 30% Mucolox). PATIENT INSTRUCTIONS: For dry mouth, swish and spit 2 to 4 mL for 1 to 2 minutes. DO NOT SWALLOW. May  repeat every 6 hours as needed for a maximum of 3 times daily 04/15/23   [provider]  Vitamin D, Ergocalciferol, (DRISDOL) 1.25 MG (50000 UNIT) CAPS capsule TAKE 1 CAPSULE BY MOUTH ONE TIME PER WEEK 03/18/23   [name], [name], NP  Zolpidem  Tartrate 3.5 MG SUBL TAKE 1 TABLET BY MOUTH EVERY DAY AT BEDTIME AS NEEDED 02/03/23   [name], [name], NP      Allergies    Lactose intolerance (gi), Norco [hydrocodone-acetaminophen ], Dust mite extract, Oxycontin [oxycodone hcl], and Pollen extract    Review of Systems   Review of Systems  Constitutional:  Negative for fever.  HENT:  Positive for congestion, sore throat and trouble swallowing.   Respiratory:  Positive for cough  and shortness of breath. Negative for wheezing.   Cardiovascular:  Negative for chest pain and leg swelling.  Gastrointestinal:  Negative for abdominal pain.    Physical Exam Updated Vital Signs BP (!) 153/69 (BP Location: Right Arm)   Pulse 64   Temp 97.9 F (36.6 C) (Oral)   Resp 16   Ht 5\' 3"  (1.6 m)   Wt 70.3 kg   SpO2 99%   BMI 27.46 kg/m  Physical Exam Vitals and nursing note reviewed.  Constitutional:      Appearance: She is well-developed.  HENT:     Head: Normocephalic and atraumatic.     Mouth/Throat:     Pharynx: Oropharynx is clear. No pharyngeal swelling, oropharyngeal exudate, posterior oropharyngeal erythema or uvula swelling.  Cardiovascular:     Rate and Rhythm: Normal rate and regular rhythm.     Heart sounds: Normal heart sounds.  Pulmonary:     Effort: Pulmonary effort is normal. No tachypnea or accessory muscle usage.     Breath sounds: Normal breath sounds. No stridor. No wheezing.  Abdominal:     Palpations: Abdomen is soft.     Tenderness: There is no abdominal tenderness.  Musculoskeletal:     Right lower leg: No edema.     Left lower leg: No edema.  Skin:    General: Skin is warm and dry.  Neurological:     Mental Status: She is alert.     ED Results / Procedures / Treatments   Labs (all labs ordered are listed, but only abnormal results are displayed) Labs Reviewed  COMPREHENSIVE METABOLIC PANEL WITH GFR - Abnormal; Notable for the following components:      Result Value   Glucose, Bld 116 (*)    Total Protein 6.3 (*)    All other components within normal limits  RESP PANEL BY RT-PCR (RSV, FLU A&B, COVID)  [id number]  CBC WITH DIFFERENTIAL/PLATELET  BRAIN NATRIURETIC PEPTIDE  TROPONIN I (HIGH SENSITIVITY)    EKG EKG Interpretation Date/Time:  Friday May 06 2023 10:05:33 EDT Ventricular Rate:  69 PR Interval:  182 QRS Duration:  82 QT Interval:  421 QTC Calculation: 451 R Axis:   25  Text Interpretation: Sinus rhythm Low  voltage, precordial leads Nonspecific T abnormalities, anterior leads no significant change since May 2024 Confirmed by Jerilynn Montenegro [phone]) on 05/06/2023 10:53:18 AM  Radiology DG Chest 2 View Result Date: 05/06/2023 CLINICAL DATA:  Shortness of breath. EXAM: CHEST - 2 VIEW COMPARISON:  11/20/2022. FINDINGS: The heart size and mediastinal contours are within normal limits. Aortic atherosclerosis. Minimal right basilar atelectasis. No focal consolidation, pleural effusion, or pneumothorax. No acute osseous abnormality. Degenerative changes of the thoracic spine. IMPRESSION: Minimal right basilar atelectasis. Otherwise, no acute cardiopulmonary  findings. Electronically Signed   By: Mannie Seek M.D.   On: 05/06/2023 11:39    Procedures Procedures    Medications Ordered in ED Medications  azelastine  (ASTELIN ) 0.1 % nasal spray 2 spray (2 sprays Each Nare Patient Refused/Not Given 05/06/23 1216)  sodium chloride  (OCEAN) 0.65 % nasal spray 1-2 spray (has no administration in time range)    ED Course/ Medical Decision Making/ A&P                                 Medical Decision Making Amount and/or Complexity of Data Reviewed External Data Reviewed: notes.    Details: GI notes Labs: ordered.    Details: Normal troponin, normal hemoglobin Radiology: ordered and independent interpretation performed.    Details: No CHF ECG/medicine tests: ordered and independent interpretation performed.    Details: No ischemia  Risk OTC drugs. Prescription drug management.   I discussed her case and presentation with her gastroenterologist, Dr. Brice Campi.  He offered to take her to endoscopy for an evaluation though could not do dilation given she is on Plavix  and last took this yesterday.  Patient feels like her symptoms are significantly improving and this is a acute on chronic problem that she has been dealing with for a while.  My suspicion of ACS, PE, pneumonia, CHF, etc. is pretty low.  She  feels comfortable waiting for an outpatient evaluation and GI was able to get an appointment on 4/23.  She was instructed to not take Plavix .  She understands this and feels well enough for discharge.  Only 1 troponin has been obtained but patient feels like this is more along her typical GI problem and does not want a second troponin would like to be discharged.  I think this is pretty reasonable.  Will give return precautions.        Final Clinical Impression(s) / ED Diagnoses Final diagnoses:  Shortness of breath    Rx / DC Orders ED Discharge Orders     None         Jerilynn Montenegro, MD 05/06/23 1229

## 2023-05-06 NOTE — ED Notes (Signed)
 PTAR at bedside

## 2023-05-06 NOTE — ED Triage Notes (Signed)
 BIB by EMS from an independent living facility for shortness of breath, denies fever. Patient appears awake, alert and oriented. No signs of distress, breathing even and unlabored. Attached to monitor. Provider at bedside.

## 2023-05-06 NOTE — Progress Notes (Signed)
 Office Visit Note   Patient: Darlene [name]           Date of Birth: 12-27-1933           MRN: [id number] Visit Date: 05/04/2023 Requested by: Tye Gall, MD [address] [place],  [place] [address] PCP: [name], [name], NP  Subjective: Chief Complaint  Patient presents with   Right Knee - Pain    HPI: Darlene [name] is a 89 y.o. female who presents to the office reporting right knee pain.  She had a fall in 2024 and was scheduled for surgery but canceled due to insurance issues.  She has a history of stroke 19 years ago.  She does walk daily but does have pain.  Can only walk now about half a mile to 1 mile.  Patient states that she walks fast.  Does take over-the-counter medications.  Pain does wake her from sleep at times.  Did have pretty significant increase in pain after her last injection and does not really want to consider that intervention anymore.  MRI scan is reviewed and shows complex degenerative type tear of that medial meniscus with high-grade chondromalacia of the medial and patellofemoral compartments..                ROS: All systems reviewed are negative as they relate to the chief complaint within the history of present illness.  Patient denies fevers or chills.  Assessment & Plan: Visit Diagnoses:  1. Pain in both knees, unspecified chronicity     Plan: Impression is right greater than left knee arthritis.  Injections have not been helpful and the patient does not want to try that intervention again.  Essentially if her symptoms become severe and untenable then we could consider knee replacement.  She is going to consider her options.  She may want to consider less walking and more nonload bearing types of exercise.  Arthroscopic intervention will not be predictably helpful for her.  She will follow-up with us  as needed.  Follow-Up Instructions: No follow-ups on file.   Orders:  Orders Placed This Encounter  Procedures   XR Knee 1-2 Views Right   XR  Knee 1-2 Views Left   No orders of the defined types were placed in this encounter.     Procedures: No procedures performed   Clinical Data: No additional findings.  Objective: Vital Signs: There were no vitals taken for this visit.  Physical Exam:  Constitutional: Patient appears well-developed HEENT:  Head: Normocephalic Eyes:EOM are normal Neck: Normal range of motion Cardiovascular: Normal rate Pulmonary/chest: Effort normal Neurologic: Patient is alert Skin: Skin is warm Psychiatric: Patient has normal mood and affect  Ortho Exam: Ortho exam demonstrates pretty reasonable range of motion of slightly less than full extension bilaterally to about 115 of flexion.  No effusion in either knee.  Collateral crucial ligaments are stable with intact extensor mechanism.  No groin pain with internal/external Tatian of the leg.  Pedal pulses palpable.  No other masses lymphadenopathy or skin changes noted in that right knee or left knee region.  Specialty Comments:  No specialty comments available.  Imaging: DG Chest 2 View Result Date: 05/06/2023 CLINICAL DATA:  Shortness of breath. EXAM: CHEST - 2 VIEW COMPARISON:  11/20/2022. FINDINGS: The heart size and mediastinal contours are within normal limits. Aortic atherosclerosis. Minimal right basilar atelectasis. No focal consolidation, pleural effusion, or pneumothorax. No acute osseous abnormality. Degenerative changes of the thoracic spine. IMPRESSION: Minimal right basilar atelectasis. Otherwise,  no acute cardiopulmonary findings. Electronically Signed   By: Mannie Seek M.D.   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/2022   Malignant melanoma of  nasal cavity (HCC) 07/29/2022   Osteoarthritis, multiple sites 11/05/2021   Anxiety 10/29/2021   SCC (squamous cell carcinoma) 09/10/2021   Right-sided epistaxis 08/27/2021   HOH (hard of hearing) 08/20/2021   Sepsis (HCC) 06/06/2021   Lactose intolerance 05/14/2021   Osteopenia after menopause 01/22/2021   OSA (obstructive sleep apnea) 11/27/2020   Postmenopause 11/27/2020   Abnormal barium swallow 10/30/2020   Precordial pain    Other spondylosis with radiculopathy, cervical region 02/19/2020   Prediabetes 01/09/2020   Adverse reaction to COVID-19 vaccine 11/29/2019   Chronic cough 11/24/2019   Melanoma of skin (HCC) 08/24/2019   Palpitations 06/07/2019   Schatzki's ring 05/12/2019   Hiatal hernia 05/12/2019   Insomnia 03/29/2019   Back pain with left-sided sciatica 03/01/2019   Daytime sleepiness 11/30/2018   History of colonic polyps 09/03/2018   Dysphagia 09/03/2018   Pyrosis 09/03/2018   Chronic sore throat 09/03/2018   Actinic keratoses 02/23/2018   Seasonal and perennial allergic rhinitis 01/31/2018   Infected surgical wound 12/01/2017   Incontinent of urine 12/01/2017   Essential hypertension 09/06/2017   History of stroke 09/06/2017   Hyperlipidemia 09/06/2017   GAD (generalized anxiety disorder) 09/06/2017   Hypothyroidism 09/06/2017   Chronic sinusitis 09/06/2017   Gastroesophageal reflux disease 09/06/2017   Past Medical History:  Diagnosis Date   Anxiety    Atypical mole 11/11/2020   Left Inner Knee (moderate-free)   Colon polyps    Depression    Hyperlipidemia    Hypertension    Hypothyroidism    Melanoma in situ (HCC) 09/19/2018   Right Arm (excision)   Skin cancer    Sleep apnea    Stroke [facility]) 2008    Family History  Problem Relation Age of Onset   Lung cancer Mother 70       smoker   Heart disease Mother    Stroke Father 4   Heart attack Father    Allergic rhinitis Sister    Hypertension Sister    CVA Brother    Lung cancer  Maternal Grandfather    Heart attack Paternal Grandfather    Melanoma Daughter    Psoriasis Daughter    Rheum arthritis Daughter    Bipolar disorder Daughter    Colon cancer Neg Hx    Esophageal cancer Neg Hx    Inflammatory bowel disease Neg Hx    Liver disease Neg Hx    Pancreatic cancer Neg Hx    Stomach cancer Neg Hx    Rectal cancer Neg Hx     Past Surgical History:  Procedure Laterality Date   ADENOIDECTOMY     APPENDECTOMY  1960   CATARACT EXTRACTION Bilateral    COLONOSCOPY     GUM SURGERY  03/2019   LUMBAR DISC SURGERY  2014   MELANOMA EXCISION  2020   MESH APPLIED TO LAP PORT     SINUS EXPLORATION     11/17/2021 operation for right nasal cavity and 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   TONSILLECTOMY  1940   Social History   Occupational History   Occupation: retired Chemical engineer  Tobacco Use   Smoking status: Never   Smokeless tobacco: Never  Vaping Use   Vaping status: Never Used  Substance and Sexual Activity   Alcohol use: Not Currently    Comment: hardly ever   Drug use: Never   Sexual activity: Not Currently

## 2023-05-06 NOTE — ED Notes (Signed)
 Report given to [facility]. Patient appears awake, alert and oriented. Breathing even and unlabored. Stretcher upon discharge with [facility].

## 2023-05-06 NOTE — ED Notes (Signed)
 Called [facility] for return trip to [facility] in [place].

## 2023-05-06 NOTE — Telephone Encounter (Signed)
 Called by ED today. Patient with progressive dysphagia and chest pain. Negative workup in the ED. Planned for outpatient EGD in May. Still on Plavix , so even though could possibly due in the hospital today, we cannot be ready to dilate. I have opening on Wednesday @ 1045. I have moved her case to that time with help from Endoscopy Team. I'll have my team formally send updated information to her so we can get her procedure done. Discussed in detail with ED provider who stated patient agreed with this plan.   Yong Henle, MD [facility] Office # [phone]

## 2023-05-06 NOTE — Discharge Instructions (Addendum)
 Do NOT take your Clopidogrel /Plavix  until cleared by Dr. Brice Campi. Your new appointment for Endoscopy is on 05/11/23 at [facility]. You need to arrive by 9:15 AM. The Gastroenterology office will call you with further details on 05/09/23.  If you develop shortness of breath, chest pain, trouble swallowing, or any other new/concerning symptoms then return to the ER.

## 2023-05-09 ENCOUNTER — Ambulatory Visit

## 2023-05-09 NOTE — Telephone Encounter (Signed)
 Left message on machine to call back

## 2023-05-09 NOTE — Telephone Encounter (Signed)
 Patient called and stated that she did not realize Patty had called her. Patient stated that she was going to stop by and ask to speak with patty directly. Please advise.

## 2023-05-09 NOTE — Telephone Encounter (Signed)
 The pt walked into the office and picked up her information for the new date and time of the procedure. We did confirm that plavix  has been held since Friday.  No further questions or concerns.

## 2023-05-10 ENCOUNTER — Ambulatory Visit: Attending: Nurse Practitioner | Admitting: Nurse Practitioner

## 2023-05-10 ENCOUNTER — Encounter (HOSPITAL_COMMUNITY): Payer: Self-pay | Admitting: Gastroenterology

## 2023-05-10 DIAGNOSIS — Z01810 Encounter for preprocedural cardiovascular examination: Secondary | ICD-10-CM | POA: Diagnosis not present

## 2023-05-10 NOTE — Progress Notes (Signed)
 Virtual Visit via Telephone Note   Because of Darlene [name] co-morbid illnesses, she is at least at moderate risk for complications without adequate follow up.  This format is felt to be most appropriate for this patient at this time.  Due to technical limitations with video connection (technology), today's appointment will be conducted as an audio only telehealth visit, and Darlene [name] verbally agreed to proceed in this manner.   All issues noted in this document were discussed and addressed.  No physical exam could be performed with this format.  Evaluation Performed:  Preoperative cardiovascular risk assessment _____________   Date:  05/10/2023   Patient ID:  Darlene [name], DOB 1933-09-27, MRN [id number] Patient Location:  Home Provider location:   Office  Primary Care Provider:  [name], Darlene X, NP Primary Cardiologist:  Darlene Perl, MD  Chief Complaint / Patient Profile   89 y.o. y/o female with a h/o Prinzmetal's angina, hypertension, hyperlipidemia, CVA, OSA, hypothyroidism, and GERD who is pending endoscopy on 05/11/2023 with Dr. Brice Campi of [facility] and presents today for telephonic preoperative cardiovascular risk assessment.  History of Present Illness    Darlene [name] is a 89 y.o. female who presents via audio/video conferencing for a telehealth visit today.  Pt was last seen in cardiology clinic on 03/29/2022 by Dr. Berry [name].  At that time Darlene [name] was doing well.  The patient is now pending procedure as outlined above. Since her last visit, she has done well from a cardiac standpoint.  She notes rare fleeting palpitations, overall stable, denies any associated symptoms.  She denies chest pain, dyspnea, pnd, orthopnea, n, v, dizziness, syncope, edema, weight gain, or early satiety. All other systems reviewed and are otherwise negative except as noted above.   Past Medical History    Past Medical History:  Diagnosis Date   Anxiety    Atypical mole 11/11/2020   Left  Inner Knee (moderate-free)   Colon polyps    Depression    Hyperlipidemia    Hypertension    Hypothyroidism    Melanoma in situ (HCC) 09/19/2018   Right Arm (excision)   Skin cancer    Sleep apnea    Stroke [facility]) 2008   Past Surgical History:  Procedure Laterality Date   ADENOIDECTOMY     APPENDECTOMY  1960   CATARACT EXTRACTION Bilateral    COLONOSCOPY     GUM SURGERY  03/2019   LUMBAR DISC SURGERY  2014   MELANOMA EXCISION  2020   MESH APPLIED TO LAP PORT     SINUS EXPLORATION     11/17/2021 operation for right nasal cavity and 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   TONSILLECTOMY  1940    Allergies  Allergies  Allergen Reactions   Lactose Intolerance (Gi) Other (See Comments)    indigestion   Norco [Hydrocodone-Acetaminophen ] Nausea And Vomiting   Dust Mite Extract Other (See Comments)    Sinus drainage   Oxycontin [Oxycodone Hcl] Nausea And Vomiting   Pollen Extract Other (See Comments)    Sinus drainage    Home Medications    Prior to Admission medications   Medication Sig Start Date End Date Taking? Authorizing Provider  ammonium lactate (AMLACTIN) 12 % cream Apply 1 Application topically as needed. 04/14/23   [provider]  clopidogrel  (PLAVIX ) 75 MG tablet Take 1 tablet (75 mg total) by mouth daily. 01/20/23   [name], Darlene X, NP  fluticasone  (FLONASE ) 50 MCG/ACT nasal spray INSTILL 1-2 SPRAYS INTO  EACH NOSTRIL DAILY 02/01/23   [name], Darlene X, NP  hydrocortisone  1 % lotion Apply 1 Application topically 2 (two) times daily. 04/28/23   [name], Darlene X, NP  ibuprofen (ADVIL) 400 MG tablet Take 400 mg by mouth every 6 (six) hours as needed for moderate pain (pain score 4-6).    [provider]  ketoconazole  (NIZORAL ) 2 % shampoo APPLY TOPICALLY 2 TIMES A WEEK 02/28/23   [name], Darlene X, NP  losartan  (COZAAR ) 50 MG tablet Take 1 tablet (50 mg total) by mouth daily. 12/21/22 12/10/24  [name], Darlene X, NP  nitroGLYCERIN  (NITROSTAT ) 0.4 MG SL  tablet Place 1 tablet (0.4 mg total) under the tongue every 5 (five) minutes as needed for up to 25 days for chest pain. 12/21/21 01/19/48  Darlene Fan, NP  nystatin  cream (MYCOSTATIN ) Apply 1 Application topically 2 (two) times daily. 04/28/23   [name], Darlene X, NP  omeprazole  (PRILOSEC) 40 MG capsule Take 1 capsule (40 mg total) by mouth as needed. 03/22/23   Darlene Gottron, PA-C  oxymetazoline  (AFRIN NASAL SPRAY) 0.05 % nasal spray Place 1 spray into both nostrils 2 (two) times daily. 11/30/22   Darlene Gall, MD  pravastatin  (PRAVACHOL ) 40 MG tablet TAKE 1 TABLET BY MOUTH EVERY DAY 03/29/23   Darlene Perl, MD  SYNTHROID  100 MCG tablet TAKE 1 TABLET BY MOUTH EVERY DAY BEFORE BREAKFAST 10/25/22   [name], Darlene X, NP  UNABLE TO FIND Take by mouth as directed. Please compound Pilocarpine 4 mg/ml Oral Rinse (with 30% Mucolox). PATIENT INSTRUCTIONS: For dry mouth, swish and spit 2 to 4 mL for 1 to 2 minutes. DO NOT SWALLOW. May repeat every 6 hours as needed for a maximum of 3 times daily 04/15/23   [provider]  Vitamin D, Ergocalciferol, (DRISDOL) 1.25 MG (50000 UNIT) CAPS capsule TAKE 1 CAPSULE BY MOUTH ONE TIME PER WEEK 03/18/23   [name], Darlene X, NP  Zolpidem  Tartrate 3.5 MG SUBL TAKE 1 TABLET BY MOUTH EVERY DAY AT BEDTIME AS NEEDED 02/03/23   [name], Darlene X, NP    Physical Exam    Vital Signs:  Ngina [name] does not have vital signs available for review today.  Given telephonic nature of communication, physical exam is limited. AAOx3. NAD. Normal affect.  Speech and respirations are unlabored.  Accessory Clinical Findings    None  Assessment & Plan    1.  Preoperative Cardiovascular Risk Assessment:  According to the Revised Cardiac Risk Index (RCRI), her Perioperative Risk of Major Cardiac Event is (%): 0.9. Her Functional Capacity in METs is: 5.07 according to the Duke Activity Status Index (DASI). Therefore, based on ACC/AHA guidelines, patient would be at acceptable risk for  the planned procedure without further cardiovascular testing.   The patient was advised that if she develops new symptoms prior to surgery to contact our office to arrange for a follow-up visit, and she verbalized understanding.  Patient takes Plavix  for history of CVA. This is not managed by cardiology.  Recommendations for holding Plavix  prior to procedure to come from managing provider (PCP).  A copy of this note will be routed to requesting surgeon.  Time:   Today, I have spent 8 minutes with the patient with telehealth technology discussing medical history, symptoms, and management plan.     Jude Norton, NP  05/10/2023, 3:31 PM

## 2023-05-10 NOTE — Anesthesia Preprocedure Evaluation (Addendum)
 Anesthesia Evaluation  Patient identified by MRN, date of birth, ID band Patient awake    Reviewed: Allergy  & Precautions, NPO status , Patient's Chart, lab work & pertinent test results  Airway Mallampati: II  TM Distance: >3 FB Neck ROM: Full    Dental no notable dental hx. (+) Implants, Teeth Intact, Dental Advisory Given   Pulmonary    Pulmonary exam normal breath sounds clear to auscultation       Cardiovascular hypertension, + angina (hx of prinemetal angina)  (-) Past MI Normal cardiovascular exam Rhythm:Regular Rate:Normal     Neuro/Psych CVA (L peripheral vision), Residual Symptoms    GI/Hepatic hiatal hernia,GERD  Medicated and Controlled,,  Endo/Other  Hypothyroidism    Renal/GU      Musculoskeletal  (+) Arthritis ,    Abdominal   Peds  Hematology   Anesthesia Other Findings All norco oxycontin  Reproductive/Obstetrics                             Anesthesia Physical Anesthesia Plan  ASA: 3  Anesthesia Plan: MAC   Post-op Pain Management: Minimal or no pain anticipated   Induction:   PONV Risk Score and Plan: 2 and Treatment may vary due to age or medical condition and Propofol  infusion  Airway Management Planned: Nasal Cannula and Natural Airway  Additional Equipment: None  Intra-op Plan:   Post-operative Plan: Extubation in OR  Informed Consent: I have reviewed the patients History and Physical, chart, labs and discussed the procedure including the risks, benefits and alternatives for the proposed anesthesia with the patient or authorized representative who has indicated his/her understanding and acceptance.     Dental advisory given  Plan Discussed with: CRNA and Anesthesiologist  Anesthesia Plan Comments: (EGD for GERD)       Anesthesia Quick Evaluation

## 2023-05-11 ENCOUNTER — Encounter (HOSPITAL_COMMUNITY)
Admission: RE | Disposition: A | Discharge status: Home / Self Care | Payer: Self-pay | Source: Home / Self Care | Attending: Gastroenterology

## 2023-05-11 ENCOUNTER — Ambulatory Visit (HOSPITAL_COMMUNITY): Admitting: Anesthesiology

## 2023-05-11 ENCOUNTER — Ambulatory Visit (HOSPITAL_COMMUNITY)
Admission: RE | Admit: 2023-05-11 | Discharge: 2023-05-11 | Disposition: A | Discharge status: Home / Self Care | Attending: Gastroenterology | Admitting: Gastroenterology

## 2023-05-11 ENCOUNTER — Encounter (HOSPITAL_COMMUNITY): Payer: Self-pay | Admitting: Gastroenterology

## 2023-05-11 ENCOUNTER — Other Ambulatory Visit: Payer: Self-pay

## 2023-05-11 DIAGNOSIS — H538 Other visual disturbances: Secondary | ICD-10-CM | POA: Diagnosis not present

## 2023-05-11 DIAGNOSIS — K219 Gastro-esophageal reflux disease without esophagitis: Secondary | ICD-10-CM | POA: Insufficient documentation

## 2023-05-11 DIAGNOSIS — I69398 Other sequelae of cerebral infarction: Secondary | ICD-10-CM | POA: Diagnosis not present

## 2023-05-11 DIAGNOSIS — K2970 Gastritis, unspecified, without bleeding: Secondary | ICD-10-CM

## 2023-05-11 DIAGNOSIS — K2950 Unspecified chronic gastritis without bleeding: Secondary | ICD-10-CM

## 2023-05-11 DIAGNOSIS — R053 Chronic cough: Secondary | ICD-10-CM | POA: Insufficient documentation

## 2023-05-11 DIAGNOSIS — K3189 Other diseases of stomach and duodenum: Secondary | ICD-10-CM | POA: Diagnosis not present

## 2023-05-11 DIAGNOSIS — K449 Diaphragmatic hernia without obstruction or gangrene: Secondary | ICD-10-CM

## 2023-05-11 DIAGNOSIS — I10 Essential (primary) hypertension: Secondary | ICD-10-CM | POA: Diagnosis not present

## 2023-05-11 DIAGNOSIS — R1310 Dysphagia, unspecified: Secondary | ICD-10-CM

## 2023-05-11 HISTORY — PX: ESOPHAGOGASTRODUODENOSCOPY: SHX5428

## 2023-05-11 SURGERY — EGD (ESOPHAGOGASTRODUODENOSCOPY)
Anesthesia: Monitor Anesthesia Care

## 2023-05-11 MED ORDER — SODIUM CHLORIDE 0.9 % IV SOLN: INTRAVENOUS | Status: DC

## 2023-05-11 MED ORDER — CLOPIDOGREL BISULFATE 75 MG PO TABS: 75.0000 mg | ORAL_TABLET | Freq: Every day | ORAL | Status: DC

## 2023-05-11 MED ORDER — PROPOFOL 10 MG/ML IV BOLUS
INTRAVENOUS | Status: DC | PRN
  Administered 2023-05-11: 20 mg via INTRAVENOUS
  Administered 2023-05-11: 40 mg via INTRAVENOUS

## 2023-05-11 MED ORDER — PROPOFOL 500 MG/50ML IV EMUL
INTRAVENOUS | Status: DC | PRN
  Administered 2023-05-11: 100 ug/kg/min via INTRAVENOUS

## 2023-05-11 MED ORDER — LIDOCAINE 2% (20 MG/ML) 5 ML SYRINGE
INTRAMUSCULAR | Status: DC | PRN
  Administered 2023-05-11: 60 mg via INTRAVENOUS

## 2023-05-11 NOTE — Anesthesia Postprocedure Evaluation (Signed)
 Anesthesia Post Note  Patient: Dominik Lauricella  Procedure(s) Performed: EGD (ESOPHAGOGASTRODUODENOSCOPY)     Patient location during evaluation: Endoscopy Anesthesia Type: MAC Level of consciousness: awake and alert Pain management: pain level controlled Vital Signs Assessment: post-procedure vital signs reviewed and stable Respiratory status: spontaneous breathing, nonlabored ventilation, respiratory function stable and patient connected to nasal cannula oxygen Cardiovascular status: blood pressure returned to baseline and stable Postop Assessment: no apparent nausea or vomiting Anesthetic complications: no   No notable events documented.  Last Vitals:  Vitals:   05/11/23 1135 05/11/23 1140  BP:  (!) 183/68  Pulse: (!) 56 (!) 55  Resp: (!) 26 19  Temp:    SpO2: 96% 100%    Last Pain:  Vitals:   05/11/23 1115  TempSrc: Temporal  PainSc:                  Rosalita Combe

## 2023-05-11 NOTE — H&P (Signed)
 GASTROENTEROLOGY PROCEDURE H&P NOTE   Primary Care Physician: [name], [name], NP  HPI: Darlene [name] is a 89 y.o. female who presents for EGD for GERD/Dysphagia/Cough.  Past Medical History:  Diagnosis Date   Anxiety    Atypical mole 11/11/2020   Left Inner Knee (moderate-free)   Colon polyps    Depression    Hyperlipidemia    Hypertension    Hypothyroidism    Melanoma in situ (HCC) 09/19/2018   Right Arm (excision)   Skin cancer    Sleep apnea    Stroke [facility]) 2008   Past Surgical History:  Procedure Laterality Date   ADENOIDECTOMY     APPENDECTOMY  1960   CATARACT EXTRACTION Bilateral    COLONOSCOPY     GUM SURGERY  03/2019   LUMBAR DISC SURGERY  2014   MELANOMA EXCISION  2020   MESH APPLIED TO LAP PORT     SINUS EXPLORATION     11/17/2021 operation for right nasal cavity and 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   TONSILLECTOMY  1940   No current facility-administered medications for this encounter.   No current facility-administered medications for this encounter. Allergies  Allergen Reactions   Lactose Intolerance (Gi) Other (See Comments)    indigestion   Norco [Hydrocodone-Acetaminophen ] Nausea And Vomiting   Dust Mite Extract Other (See Comments)    Sinus drainage   Oxycontin [Oxycodone Hcl] Nausea And Vomiting   Pollen Extract Other (See Comments)    Sinus drainage   Family History  Problem Relation Age of Onset   Lung cancer Mother 84       smoker   Heart disease Mother    Stroke Father 39   Heart attack Father    Allergic rhinitis Sister    Hypertension Sister    CVA Brother    Lung cancer Maternal Grandfather    Heart attack Paternal Grandfather    Melanoma Daughter    Psoriasis Daughter    Rheum arthritis Daughter    Bipolar disorder Daughter    Colon cancer Neg Hx    Esophageal cancer Neg Hx    Inflammatory bowel disease Neg Hx    Liver disease Neg Hx    Pancreatic cancer Neg Hx    Stomach cancer Neg Hx     Rectal cancer Neg Hx    Social History   Socioeconomic History   Marital status: Widowed    Spouse name: Not on file   Number of children: 3   Years of education: Not on file   Highest education level: Not on file  Occupational History   Occupation: retired Chemical engineer  Tobacco Use   Smoking status: Never   Smokeless tobacco: Never  Vaping Use   Vaping status: Never Used  Substance and Sexual Activity   Alcohol use: Not Currently    Comment: hardly ever   Drug use: Never   Sexual activity: Not Currently  Other Topics Concern   Not on file  Social History Narrative   Social History      Diet? ok      Do you drink/eat things with caffeine? Weak coffee, (much milk)      Marital status?          divorced                          What year were you married? 1961      Do  you live in a house, apartment, assisted living, condo, trailer, etc.? apartment      Is it one or more stories? 4      How many persons live in your home? Only me      Do you have any pets in your home? (please list) no      Highest level of education completed? 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             yes                         Type & how often? Walk- would love to return to water exercise      Advanced Directives      Do you have a living will? yes      Do you have a DNR form?           no                       If not, do you want to discuss one? no      Do you have signed POA/HPOA for forms? no      Functional Status      Do you have difficulty bathing or dressing yourself?  no      Do you have difficulty preparing food or eating? no      Do you have difficulty managing your medications? no      Do you have difficulty managing your finances?  no      Do you have difficulty affording your medications? no      Social Drivers of Corporate investment banker Strain: Not on file  Food  Insecurity: No Food Insecurity (04/19/2023)   Hunger Vital Sign    Worried About Running Out of Food in the Last Year: Never true    Ran Out of Food in the Last Year: Never true  Transportation Needs: No Transportation Needs (04/19/2023)   PRAPARE - Administrator, Civil Service (Medical): No    Lack of Transportation (Non-Medical): No  Physical Activity: Not on file  Stress: Not on file  Social Connections: Moderately Isolated (04/19/2023)   Social Connection and Isolation Panel [NHANES]    Frequency of Communication with Friends and Family: More than three times a week    Frequency of Social Gatherings with Friends and Family: More than three times a week    Attends Religious Services: Never    Database administrator or Organizations: Yes    Attends Engineer, structural: More than 4 times per year    Marital Status: Widowed  Intimate Partner Violence: Not At Risk (04/19/2023)   Humiliation, Afraid, Rape, and Kick questionnaire    Fear of Current or Ex-Partner: No    Emotionally Abused: No    Physically Abused: No    Sexually Abused: No    Physical Exam: There were no vitals filed for this visit. There is no height or weight on file to calculate BMI. GEN: NAD EYE: Sclerae anicteric ENT: MMM CV: Non-tachycardic GI: Soft, NT/ND NEURO:  Alert & Oriented x 3  Lab Results: No results for input(s): "WBC", "HGB", "HCT", "PLT" in the last 72 hours. BMET No results for input(s): "NA", "K", "CL", "CO2", "GLUCOSE", "BUN", "CREATININE", "CALCIUM" in the last 72 hours. LFT  No results for input(s): "PROT", "ALBUMIN", "AST", "ALT", "ALKPHOS", "BILITOT", "BILIDIR", "IBILI" in the last 72 hours. PT/INR No results for input(s): "LABPROT", "INR" in the last 72 hours.   Impression / Plan: This is a 89 y.o.female who presents for EGD for GERD/Dysphagia/Cough.  The risks and benefits of endoscopic evaluation/treatment were discussed with the patient and/or family; these  include but are not limited to the risk of perforation, infection, bleeding, missed lesions, lack of diagnosis, severe illness requiring hospitalization, as well as anesthesia and sedation related illnesses.  The patient's history has been reviewed, patient examined, no change in status, and deemed stable for procedure.  The patient and/or family is agreeable to proceed.    Yong Henle, MD [facility] Advanced Endoscopy Office # [phone]

## 2023-05-11 NOTE — Telephone Encounter (Signed)
 This was taken care of by patty

## 2023-05-11 NOTE — Op Note (Signed)
 [facility] Patient Name: Darlene [name] Procedure Date: 05/11/2023 MRN: [id number] Attending MD: Yong Henle , MD, [id number] Date of Birth: Dec 23, 1933 CSN: [id number] Age: 89 Admit Type: Ambulatory Procedure:                Upper GI endoscopy Indications:              Dysphagia, Chronic cough Providers:                Yong Henle, MD, Marylee Snowball, RN,                            Gabino Joe, Technician Referring MD:             Yong Henle, MD Medicines:                Monitored Anesthesia Care Complications:            No immediate complications. Estimated Blood Loss:     Estimated blood loss was minimal. Procedure:                Pre-Anesthesia Assessment:                           - Prior to the procedure, a History and Physical                            was performed, and patient medications and                            allergies were reviewed. The patient's tolerance of                            previous anesthesia was also reviewed. The risks                            and benefits of the procedure and the sedation                            options and risks were discussed with the patient.                            All questions were answered, and informed consent                            was obtained. Prior Anticoagulants: The patient has                            taken Plavix  (clopidogrel ), last dose was 5 days                            prior to procedure. ASA Grade Assessment: III - A                            patient with severe systemic disease. After  reviewing the risks and benefits, the patient was                            deemed in satisfactory condition to undergo the                            procedure.                           After obtaining informed consent, the endoscope was                            passed under direct vision. Throughout the                             procedure, the patient's blood pressure, pulse, and                            oxygen saturations were monitored continuously. The                            [id number]-H190 ([id number]) Olympus endoscope was introduced                            through the mouth, and advanced to the second part                            of duodenum. The upper GI endoscopy was                            accomplished without difficulty. The patient                            tolerated the procedure. Scope In: Scope Out: Findings:      No gross lesions were noted in the entire esophagus. Biopsies were taken       with a cold forceps for histology. After the rest of the EGD was       completed, a guidewire was placed and the scope was withdrawn. Dilation       was performed with a Savary dilator with mild resistance at 16 mm and       moderate resistance at 18 mm. The dilation site was examined following       endoscope reinsertion and showed very mild mucosal disruption, mild       improvement in luminal narrowing just below the UES and no perforation.      The Z-line was regular and was found 37 cm from the incisors.      A 4 cm hiatal hernia was present.      Patchy mildly erythematous mucosa was found in the entire examined       stomach. Biopsies were taken with a cold forceps for histology and       Helicobacter pylori testing.      No gross lesions were noted in the duodenal bulb, in the first portion       of the duodenum and in the second portion of the duodenum.  Impression:               - No gross lesions in the entire esophagus.                            Biopsied. Dilated up to 18 mm savory with mucosal                            wrent.                           - Z-line regular, 37 cm from the incisors.                           - 4 cm hiatal hernia.                           - Erythematous mucosa in the stomach. Biopsied.                           - No gross lesions in the duodenal bulb, in the                             first portion of the duodenum and in the second                            portion of the duodenum. Moderate Sedation:      Not Applicable - Patient had care per Anesthesia. Recommendation:           - The patient will be observed post-procedure,                            until all discharge criteria are met.                           - Discharge patient to home.                           - Patient has a contact number available for                            emergencies. The signs and symptoms of potential                            delayed complications were discussed with the                            patient. Return to normal activities tomorrow.                            Written discharge instructions were provided to the                            patient.                           -  Dilation diet as per protocol.                           - Continue present medications.                           - The findings and recommendations were discussed                            with the patient.                           - The findings and recommendations were discussed                            with the designated responsible adult. Procedure Code(s):        --- Professional ---                           [phone], Esophagogastroduodenoscopy, flexible,                            transoral; with insertion of guide wire followed by                            passage of dilator(s) through esophagus over guide                            wire                           43239, 59, Esophagogastroduodenoscopy, flexible,                            transoral; with biopsy, single or multiple Diagnosis Code(s):        --- Professional ---                           K44.9, Diaphragmatic hernia without obstruction or                            gangrene                           K31.89, Other diseases of stomach and duodenum                           R13.10, Dysphagia,  unspecified                           R05.3, Chronic cough CPT copyright 2022 American Medical Association. All rights reserved. The codes documented in this report are preliminary and upon coder review may  be revised to meet current compliance requirements. Yong Henle, MD 05/11/2023 11:29:01 AM Number of Addenda: 0

## 2023-05-11 NOTE — Transfer of Care (Signed)
 Immediate Anesthesia Transfer of Care Note  Patient: Darlene [name]  Procedure(s) Performed: EGD (ESOPHAGOGASTRODUODENOSCOPY)  Patient Location: PACU  Anesthesia Type:General  Level of Consciousness: awake, alert , oriented, and patient cooperative  Airway & Oxygen Therapy: Patient Spontanous Breathing and Patient connected to face mask oxygen  Post-op Assessment: Report given to RN and Post -op Vital signs reviewed and stable  Post vital signs: Reviewed and stable  Last Vitals:  Vitals Value Taken Time  BP    Temp    Pulse 59 05/11/23 1114  Resp 17 05/11/23 1114  SpO2 100 % 05/11/23 1114  Vitals shown include unfiled device data.  Last Pain:  Vitals:   05/11/23 0934  TempSrc: Temporal  PainSc: 0-No pain         Complications: No notable events documented.

## 2023-05-11 NOTE — Discharge Instructions (Signed)

## 2023-05-12 ENCOUNTER — Encounter: Payer: Self-pay | Admitting: Gastroenterology

## 2023-05-12 LAB — SURGICAL PATHOLOGY

## 2023-05-15 ENCOUNTER — Encounter (HOSPITAL_COMMUNITY): Payer: Self-pay | Admitting: Gastroenterology

## 2023-05-19 ENCOUNTER — Ambulatory Visit: Admitting: Nurse Practitioner

## 2023-05-19 ENCOUNTER — Encounter: Payer: Self-pay | Admitting: Nurse Practitioner

## 2023-05-19 VITALS — BP 142/88 | HR 70 | Temp 97.8°F | Resp 20 | Ht 63.0 in | Wt 154.4 lb

## 2023-05-19 DIAGNOSIS — I10 Essential (primary) hypertension: Secondary | ICD-10-CM

## 2023-05-19 DIAGNOSIS — E039 Hypothyroidism, unspecified: Secondary | ICD-10-CM | POA: Diagnosis not present

## 2023-05-19 DIAGNOSIS — K219 Gastro-esophageal reflux disease without esophagitis: Secondary | ICD-10-CM

## 2023-05-19 NOTE — Progress Notes (Signed)
 Location:   [facility]   Place of Service:    Provider: Kerman Peck NP  Hafsa Lohn X, NP  Patient Care Team: Shakerria Parran X, NP as PCP - General (Internal Medicine) Knox Perl, MD as PCP - Cardiology (Cardiology) Clark-Burning, Bridgette Campus, PA-C (Inactive) (Dermatology) Knox Perl, MD as Consulting Physician (Cardiology) Mansouraty, Albino Alu., MD as Consulting Physician (Gastroenterology) Prescott Brodie, MD (Inactive) as Consulting Physician (Otolaryngology) Dorthey Gave, PA-C as Physician Assistant (Dermatology) Plonk, Renda Carpen, MD as Referring Physician (Otolaryngology)  Extended Emergency Contact Information Primary Emergency Contact: Sink,John Home Phone: [phone] Relation: Brother Secondary Emergency Contact: Amparo Balk Home Phone: [phone] Relation: Friend  Code Status:  DNR Goals of care: Advanced Directive information    05/19/2023    1:45 PM  Advanced Directives  Does Patient Have a Medical Advance Directive? Yes  Type of Estate agent of [place];Living will  Does patient want to make changes to medical advance directive? No - Patient declined  Copy of Healthcare Power of Attorney in Chart? No - copy requested     Chief Complaint  Patient presents with   Follow-up    4 Week follow up.    HPI:  Pt is a 89 y.o. female seen today for medical management of chronic diseases.    Dry mouth, didn't tolerate Cevimeline, cause frequent BM, night sweat, fatigue, wants to eliminate Ambien  to avoid day time sleepiness. Now special mouth wash works well.               Resolved pain allover after immunotherapy infusion,, cancer center recommended Tylenol  up to 3000mg  a day for pain.                          After 2nd Shingrix, feels aches, headache, more nasal/ear congestion.   05/06/23 ED eval SOB, attributed to GERD-discharged with return precaution,  followed by GI, EGD 05/11/23, Dr. Maryland Snow, no gross lesions, dilated,  biopsied.  07/21/22 ED eval, dehydration, gum swelling             Malignant melanoma of nasal cavity, followed by oncology, ENT, dental team, remission.              Anxiety, chronic              Sleeps too much during day, failed dc Ambien  in the past,  off Valium , refused CPAP, feels a veil in front of left eye, more noticeable left facial weakness Hx of chronic rhinitis/sphenoidal sinusitis, s/p surgery, improved SOB, wheezing, but still has nasal/right ear congestion, on Astelin , Flonase , Bronchodilator, underwent Pulmonology, ENT, GI, Allergy  evaluation, had spirometry 10/20/20, Echo 60-65% EF 04/16/20, CTA 09/01/20. Saw ENT             Chronic sore throat: multiple factorials, GERD vs chronic rhinitis/sinusitis,  ENT  Insomnia trial of dc Ambien              Hx of CVA, on plavix . Statin,  residual of  poor left peripheral vision  Hypothyroidism, on Levothyroxine -declined dose adjustment.  TSH 0.168 02/18/22, 0.77 03/14/23             HTN, blood pressure is controlled on Losartan  qd. added Imdur  30mg  tid by Cardiology. Followed by Cardiology. CTA negative 09/01/20. Bun/creat 15/0.45 05/06/23            GERD, stable Omeprazole , FDgard helped,  saw GI, EGD 12/26/19 and 05/11/23(dilated, no gross lesions)Vit B12 383 12/02/20, Hgb 13.8  05/06/23             Lactose intolerance, diet adjustment, f/u GI             Esophageal dysmotility, Swallow studay 05/12/20, worked with ST, GI f/u             OSA sleep study, saw Pulmonology.  Not using CPAP                                     OA, saw Ortho. Cervical spinal stenosis, MR 02/26/20, C6-C7. Delaying  R mensicus tender surgery, Tramadol, Advil.              Hyperlipidemia, takes Pravastatin , LDL 94 05/12/21             Urinary frequency/leakage, 2-3x/night, stopped taking Gemtesa , acupuncture improved initially. Saw urology. treated with Augmentin  04/19/23, urine culture showed no growth 04/19/23, persisted with symptoms             Osteopenia, DEXA 12/25/20, t score  -1.0,  10 years possibility major fx 11%, hip fx 2.6%. Vit D, Cal-diet rich,  Vit D 3 39 12/26/20             HOH audiology eval, ENT placed tube in the right ear-failed-again, hearing aids  Past Medical History:  Diagnosis Date   Anxiety    Atypical mole 11/11/2020   Left Inner Knee (moderate-free)   Colon polyps    Depression    Hyperlipidemia    Hypertension    Hypothyroidism    Melanoma in situ (HCC) 09/19/2018   Right Arm (excision)   Skin cancer    Sleep apnea    Stroke [facility]) 2008   Past Surgical History:  Procedure Laterality Date   ADENOIDECTOMY     APPENDECTOMY  1960   CATARACT EXTRACTION Bilateral    COLONOSCOPY     ESOPHAGOGASTRODUODENOSCOPY N/A 05/11/2023   Procedure: EGD (ESOPHAGOGASTRODUODENOSCOPY);  Surgeon: Normie Becton., MD;  Location: [facility];  Service: Gastroenterology;  Laterality: N/A;   GUM SURGERY  03/2019   LUMBAR DISC SURGERY  2014   MELANOMA EXCISION  2020   MESH APPLIED TO LAP PORT     SINUS EXPLORATION     11/17/2021 operation for right nasal cavity and 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   TONSILLECTOMY  1940    Allergies  Allergen Reactions   Lactose Intolerance (Gi) Other (See Comments)    indigestion   Norco [Hydrocodone-Acetaminophen ] Nausea And Vomiting   Dust Mite Extract Other (See Comments)    Sinus drainage   Oxycontin [Oxycodone Hcl] Nausea And Vomiting   Pollen Extract Other (See Comments)    Sinus drainage    Allergies as of 05/19/2023       Reactions   Lactose Intolerance (gi) Other (See Comments)   indigestion   Norco [hydrocodone-acetaminophen ] Nausea And Vomiting   Dust Mite Extract Other (See Comments)   Sinus drainage   Oxycontin [oxycodone Hcl] Nausea And Vomiting   Pollen Extract Other (See Comments)   Sinus drainage        Medication List        Accurate as of May 19, 2023  4:15 PM. If you have any questions, ask your nurse or doctor.          ammonium  lactate 12 % cream Commonly known as: AMLACTIN Apply 1 Application topically as needed.  azelastine  0.1 % nasal spray Commonly known as: ASTELIN  Place into both nostrils daily. Use in each nostril as directed   clopidogrel  75 MG tablet Commonly known as: PLAVIX  Take 1 tablet (75 mg total) by mouth daily.   fluticasone  50 MCG/ACT nasal spray Commonly known as: FLONASE  INSTILL 1-2 SPRAYS INTO EACH NOSTRIL DAILY   hydrocortisone  1 % lotion Apply 1 Application topically 2 (two) times daily.   ibuprofen 400 MG tablet Commonly known as: ADVIL Take 400 mg by mouth every 6 (six) hours as needed for moderate pain (pain score 4-6).   ketoconazole  2 % shampoo Commonly known as: NIZORAL  APPLY TOPICALLY 2 TIMES A WEEK   losartan  50 MG tablet Commonly known as: COZAAR  Take 1 tablet (50 mg total) by mouth daily.   nitroGLYCERIN  0.4 MG SL tablet Commonly known as: NITROSTAT  Place 1 tablet (0.4 mg total) under the tongue every 5 (five) minutes as needed for up to 25 days for chest pain.   nystatin  cream Commonly known as: MYCOSTATIN  Apply 1 Application topically 2 (two) times daily.   omeprazole  40 MG capsule Commonly known as: PRILOSEC Take 1 capsule (40 mg total) by mouth as needed.   oxymetazoline  0.05 % nasal spray Commonly known as: Afrin Nasal Spray Place 1 spray into both nostrils 2 (two) times daily.   PILOCARPINE HCL PO Take 2 mLs by mouth.   pravastatin  40 MG tablet Commonly known as: PRAVACHOL  TAKE 1 TABLET BY MOUTH EVERY DAY   Synthroid  100 MCG tablet Generic drug: levothyroxine  TAKE 1 TABLET BY MOUTH EVERY DAY BEFORE BREAKFAST   UNABLE TO FIND Take by mouth as directed. Please compound Pilocarpine 4 mg/ml Oral Rinse (with 30% Mucolox). PATIENT INSTRUCTIONS: For dry mouth, swish and spit 2 to 4 mL for 1 to 2 minutes. DO NOT SWALLOW. May repeat every 6 hours as needed for a maximum of 3 times daily   Vitamin D (Ergocalciferol) 1.25 MG (50000 UNIT) Caps  capsule Commonly known as: DRISDOL TAKE 1 CAPSULE BY MOUTH ONE TIME PER WEEK   Zolpidem  Tartrate 3.5 MG Subl TAKE 1 TABLET BY MOUTH EVERY DAY AT BEDTIME AS NEEDED        Review of Systems  Immunization History  Administered Date(s) Administered   Hepatitis A 04/04/2000   Influenza-Unspecified 10/18/2014   Moderna Sars-Covid-2 Vaccination 01/22/2019, 02/19/2019, 06/05/2020   Pneumococcal Conjugate-13 12/20/2018   Pneumococcal Polysaccharide-23 01/18/2005   Tdap 11/25/2008   Zoster Recombinant(Shingrix) 12/20/2018, 01/20/2022, 09/15/2022   Pertinent  Health Maintenance Due  Topic Date Due   Colonoscopy  10/25/2021   INFLUENZA VACCINE  08/19/2023   DEXA SCAN  Completed      11/30/2022   11:22 AM 12/09/2022    3:27 PM 12/30/2022   12:59 PM 04/19/2023    3:14 PM 05/19/2023    1:44 PM  Fall Risk  Falls in the past year? 0 0 0 0 0  Was there an injury with Fall? 0 0 0 0 0  Fall Risk Category Calculator 0 0 0 0 0  Patient at Risk for Falls Due to No Fall Risks   No Fall Risks No Fall Risks  Fall risk Follow up Falls evaluation completed;Education provided;Falls prevention discussed   Falls evaluation completed Falls evaluation completed   Functional Status Survey:    Vitals:   05/19/23 1350  BP: (!) 142/88  Pulse: 70  Resp: 20  Temp: 97.8 F (36.6 C)  SpO2: 96%  Weight: 154 lb 6.4 oz (70 kg)  Height:  5\' 3"  (1.6 m)   Body mass index is 27.35 kg/m. Physical Exam  Labs reviewed: Recent Labs    09/16/22 1510 04/19/23 1546 05/06/23 1114  NA 139 142 142  K 4.0 4.3 3.9  CL 106 106 111  CO2 26 26 23   GLUCOSE 119* 87 116*  BUN 14 17 15   CREATININE 0.65 0.75 0.45  CALCIUM 9.3 10.0 9.5   Recent Labs    06/15/22 1706 07/21/22 0522 05/06/23 1114  AST 18 18 16   ALT 17 16 14   ALKPHOS 58 67 60  BILITOT 0.5 0.7 0.7  PROT 6.7 6.7 6.3*  ALBUMIN 3.7 3.9 3.6   Recent Labs    06/15/22 1706 07/21/22 0522 09/16/22 1510 04/19/23 1546 05/06/23 1114  WBC 7.0  6.3 9.7 7.0 5.6  NEUTROABS 4.2 3.5  --   --  3.0  HGB 13.6 13.7 14.3 14.7 13.8  HCT 40.6 41.4 43.9 43.6 42.9  MCV 90.0 90.4 91.1 88.1 92.1  PLT 243 243 247 275 231   Lab Results  Component Value Date   TSH 0.77 03/14/2023   Lab Results  Component Value Date   HGBA1C 6.1 (H) 06/07/2021   Lab Results  Component Value Date   CHOL 163 05/12/2021   HDL 44 05/12/2021   LDLCALC 94 05/12/2021   TRIG 144 05/12/2021   CHOLHDL 3.5 12/26/2019    Significant Diagnostic Results in last 30 days:  XR Knee 1-2 Views Left Result Date: 05/06/2023 AP lateral merchant radiographs left knee reviewed.  Mild varus alignment is present.  No acute fracture.  Moderate to severe medial joint space arthritis is present.  Moderate arthritis is present in the lateral patellofemoral compartments.  XR Knee 1-2 Views Right Result Date: 05/06/2023 AP lateral merchant radiographs right knee reviewed.  Mild varus alignment is present.  No acute fracture.  Moderate to severe medial joint space arthritis is present.  Moderate arthritis is present in the lateral patellofemoral compartments.  DG Chest 2 View Result Date: 05/06/2023 CLINICAL DATA:  Shortness of breath. EXAM: CHEST - 2 VIEW COMPARISON:  11/20/2022. FINDINGS: The heart size and mediastinal contours are within normal limits. Aortic atherosclerosis. Minimal right basilar atelectasis. No focal consolidation, pleural effusion, or pneumothorax. No acute osseous abnormality. Degenerative changes of the thoracic spine. IMPRESSION: Minimal right basilar atelectasis. Otherwise, no acute cardiopulmonary findings. Electronically Signed   By: Mannie Seek M.D.   On: 05/06/2023 11:39    Assessment/Plan  Gastroesophageal reflux disease 05/06/23 ED eval SOB, attributed to GERD-discharged with return precaution,  followed by GI, EGD 05/11/23, Dr. Maryland Snow, no gross lesions, dilated, biopsied.   Hypothyroidism on Levothyroxine -declined dose adjustment.  TSH 0.168  02/18/22, 0.77 03/14/23  Essential hypertension blood pressure is controlled on Losartan  qd. added Imdur  30mg  tid by Cardiology. Followed by Cardiology. CTA negative 09/01/20. Bun/creat 15/0.45 05/06/23   Family/ staff Communication: plan of care reviewed with the patient  Labs/tests ordered:  none

## 2023-05-19 NOTE — Assessment & Plan Note (Signed)
 05/06/23 ED eval SOB, attributed to GERD-discharged with return precaution,  followed by GI, EGD 05/11/23, Dr. Maryland Snow, no gross lesions, dilated, biopsied.

## 2023-05-19 NOTE — Assessment & Plan Note (Signed)
 on Levothyroxine-declined dose adjustment.  TSH 0.168 02/18/22, 0.77 03/14/23

## 2023-05-19 NOTE — Assessment & Plan Note (Signed)
 blood pressure is controlled on Losartan  qd. added Imdur  30mg  tid by Cardiology. Followed by Cardiology. CTA negative 09/01/20. Bun/creat 15/0.45 05/06/23

## 2023-05-20 ENCOUNTER — Encounter: Payer: Self-pay | Admitting: Cardiology

## 2023-05-20 ENCOUNTER — Ambulatory Visit: Attending: Cardiology | Admitting: Cardiology

## 2023-05-20 VITALS — BP 129/73 | HR 85 | Resp 16 | Ht 63.0 in | Wt 155.0 lb

## 2023-05-20 DIAGNOSIS — E7800 Pure hypercholesterolemia, unspecified: Secondary | ICD-10-CM

## 2023-05-20 DIAGNOSIS — I10 Essential (primary) hypertension: Secondary | ICD-10-CM | POA: Diagnosis not present

## 2023-05-20 DIAGNOSIS — I201 Angina pectoris with documented spasm: Secondary | ICD-10-CM

## 2023-05-20 NOTE — Patient Instructions (Signed)

## 2023-05-20 NOTE — Progress Notes (Signed)
 " Cardiology Office Note:  .   Date:  05/20/2023  ID:  Darlene [name], DOB 07-20-1933, MRN [id number] PCP: [name], [name], NP  [facility] Providers Cardiologist:  Gordy Bergamo, MD   History of Present Illness: .   Darlene [name] is a 89 y.o. Caucasian female with hypertension, hyperlipidemia, prediabetes, h/o sleep apnea not on CPAP, prior history of stroke in 2006 without residual defect, on Plavix  chronically. She has had frequent palpitations, difficulty in controlling hypertension, extreme anxiety, multiple medication intolerances.   Patient was seen in the emergency room on 05/06/2023 for shortness of breath chest x-ray revealing mild right basilar atelectasis felt to be related to GERD and she does have residual esophageal stricture that needs dilatation but this was not performed as she is on Plavix  chronically.  She now presents for a annual visit.  Discussed the use of AI scribe software for clinical note transcription with the patient, who gave verbal consent to proceed.  History of Present Illness Darlene [name] is an 89 year old female who presents for cardiovascular evaluation for Prinzmetal's angina.   She experiences occasional nocturnal heart fluttering without chest pain. She sometimes feels pain in her jaw or left arm, which she attributes to 'Prinzmetal angina'. Her blood pressure is well-controlled with losartan , and she is on pravastatin  for cholesterol management. She is on Plavix  following a minor stroke 17 years ago, which resulted in loss of left peripheral vision.  Labs   Lab Results  Component Value Date   CHOL 163 05/12/2021   HDL 44 05/12/2021   LDLCALC 94 05/12/2021   TRIG 144 05/12/2021   CHOLHDL 3.5 12/26/2019   Lab Results  Component Value Date   NA 142 05/06/2023   K 3.9 05/06/2023   CO2 23 05/06/2023   GLUCOSE 116 (H) 05/06/2023   BUN 15 05/06/2023   CREATININE 0.45 05/06/2023   CALCIUM  9.5 05/06/2023   EGFR 77 04/19/2023   GFRNONAA >60  05/06/2023      Latest Ref Rng & Units 05/06/2023   11:14 AM 04/19/2023    3:46 PM 09/16/2022    3:10 PM  BMP  Glucose 70 - 99 mg/dL 883  87  880   BUN 8 - 23 mg/dL 15  17  14    Creatinine 0.44 - 1.00 mg/dL 9.54  9.24  9.34   BUN/Creat Ratio 6 - 22 (calc)  SEE NOTE:    Sodium 135 - 145 mmol/L 142  142  139   Potassium 3.5 - 5.1 mmol/L 3.9  4.3  4.0   Chloride 98 - 111 mmol/L 111  106  106   CO2 22 - 32 mmol/L 23  26  26    Calcium  8.9 - 10.3 mg/dL 9.5  89.9  9.3       Latest Ref Rng & Units 05/06/2023   11:14 AM 04/19/2023    3:46 PM 09/16/2022    3:10 PM  CBC  WBC 4.0 - 10.5 K/uL 5.6  7.0  9.7   Hemoglobin 12.0 - 15.0 g/dL 86.1  85.2  85.6   Hematocrit 36.0 - 46.0 % 42.9  43.6  43.9   Platelets 150 - 400 K/uL 231  275  247    Lab Results  Component Value Date   HGBA1C 6.1 (H) 06/07/2021    Lab Results  Component Value Date   TSH 0.77 03/14/2023    ROS  Review of Systems  Cardiovascular:  Positive for chest pain. Negative for dyspnea on exertion and leg  swelling.    Physical Exam:   VS:  BP 129/73 (BP Location: Left Arm, Patient Position: Sitting, Cuff Size: Normal)   Pulse 85   Resp 16   Ht 5' 3 (1.6 m)   Wt 155 lb (70.3 kg)   SpO2 94%   BMI 27.46 kg/m    Wt Readings from Last 3 Encounters:  05/20/23 155 lb (70.3 kg)  05/19/23 154 lb 6.4 oz (70 kg)  05/11/23 158 lb 1.1 oz (71.7 kg)    Physical Exam Neck:     Vascular: No carotid bruit or JVD.  Cardiovascular:     Rate and Rhythm: Normal rate and regular rhythm.     Pulses: Intact distal pulses.     Heart sounds: Normal heart sounds. No murmur heard.    No gallop.  Pulmonary:     Effort: Pulmonary effort is normal.     Breath sounds: Normal breath sounds.  Abdominal:     General: Bowel sounds are normal.     Palpations: Abdomen is soft.  Musculoskeletal:     Right lower leg: No edema.     Left lower leg: No edema.    Studies Reviewed: SABRA    Coronary CTA 09/01/2020: 1. No acute findings in the imaged  extracardiac chest. 2. Small to moderate hiatal hernia. 3. Aortic Atherosclerosis (ICD10-I70.0).   1. Total coronary calcium  score of 0 AU.  2. Normal coronary origin with left dominance. Normal coronary arteries.   3. Aortic atherosclerosis.  4. CAD-RADS 0: No evidence of epicardial coronary artery disease (0%). RECOMMENDATIONS: Consider non-atherosclerotic causes of chest pain.  EKG:    EKG 03/29/2022: Normal sinus rhythm with rate of 71 bpm, normal axis, IRBBB, nonspecific T wave flattening. No significant change from 05/08/2021.   Medications and allergies    Allergies  Allergen Reactions   Lactose Intolerance (Gi) Other (See Comments)    indigestion   Norco [Hydrocodone-Acetaminophen ] Nausea And Vomiting   Dust Mite Extract Other (See Comments)    Sinus drainage   Oxycontin [Oxycodone Hcl] Nausea And Vomiting   Pollen Extract Other (See Comments)    Sinus drainage     Current Outpatient Medications:    ammonium lactate (AMLACTIN) 12 % cream, Apply 1 Application topically as needed., Disp: , Rfl:    azelastine  (ASTELIN ) 0.1 % nasal spray, Place into both nostrils daily. Use in each nostril as directed, Disp: , Rfl:    clopidogrel  (PLAVIX ) 75 MG tablet, Take 1 tablet (75 mg total) by mouth daily., Disp: , Rfl:    hydrocortisone  1 % lotion, Apply 1 Application topically 2 (two) times daily., Disp: 118 mL, Rfl: 3   ibuprofen (ADVIL) 400 MG tablet, Take 400 mg by mouth every 6 (six) hours as needed for moderate pain (pain score 4-6)., Disp: , Rfl:    ketoconazole  (NIZORAL ) 2 % shampoo, APPLY TOPICALLY 2 TIMES A WEEK, Disp: 120 mL, Rfl: 1   losartan  (COZAAR ) 50 MG tablet, Take 1 tablet (50 mg total) by mouth daily., Disp: 90 tablet, Rfl: 3   nitroGLYCERIN  (NITROSTAT ) 0.4 MG SL tablet, Place 1 tablet (0.4 mg total) under the tongue every 5 (five) minutes as needed for up to 25 days for chest pain., Disp: 25 tablet, Rfl: 3   nystatin  cream (MYCOSTATIN ), Apply 1 Application topically  2 (two) times daily., Disp: 30 g, Rfl: 3   omeprazole  (PRILOSEC) 40 MG capsule, Take 1 capsule (40 mg total) by mouth as needed., Disp: 90 capsule, Rfl: 3  oxymetazoline  (AFRIN NASAL SPRAY) 0.05 % nasal spray, Place 1 spray into both nostrils 2 (two) times daily., Disp: 30 mL, Rfl: 0   PILOCARPINE HCL PO, Take 2 mLs by mouth., Disp: , Rfl:    pravastatin  (PRAVACHOL ) 40 MG tablet, TAKE 1 TABLET BY MOUTH EVERY DAY, Disp: 30 tablet, Rfl: 0   SYNTHROID  100 MCG tablet, TAKE 1 TABLET BY MOUTH EVERY DAY BEFORE BREAKFAST, Disp: 90 tablet, Rfl: 1   Vitamin D, Ergocalciferol, (DRISDOL) 1.25 MG (50000 UNIT) CAPS capsule, TAKE 1 CAPSULE BY MOUTH ONE TIME PER WEEK, Disp: 12 capsule, Rfl: 3   Zolpidem  Tartrate 3.5 MG SUBL, TAKE 1 TABLET BY MOUTH EVERY DAY AT BEDTIME AS NEEDED, Disp: 30 tablet, Rfl: 1   No orders of the defined types were placed in this encounter.    Medications Discontinued During This Encounter  Medication Reason   fluticasone  (FLONASE ) 50 MCG/ACT nasal spray Change in therapy   UNABLE TO FIND      ASSESSMENT AND PLAN: .      ICD-10-CM   1. Prinzmetal's angina (HCC)  I20.1     2. Primary hypertension  I10     3. Pure hypercholesterolemia  E78.00       Assessment and Plan Assessment & Plan Prinzmetal angina   She experiences intermittent chest pain at rest, typically at night, consistent with Prinzmetal angina. Symptoms are mild and infrequent, with no exertional chest pain or significant discomfort. Previous cardiac workup showed no blockages. Contact provider if chest pain episodes become more frequent or severe.  She is not on any vasodilator drugs or on nitroglycerin , states that she has remained stable and does not need them.  Hypertension   Blood pressure is well-controlled on losartan , with a recent reading of 129/70 mmHg. Previous episodes of elevated blood pressure occurred during cancer treatment infusions, but it is currently well-managed. Continue losartan  for blood  pressure management.  Stroke   A minor stroke 17 years ago resulted in loss of left peripheral vision. There have been no recurrent strokes or significant neurological deficits since then. Continue pravastatin  40 mg daily for cholesterol management and Plavix  for stroke prevention.  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 She underwent infusions for approximately a year, resulting in cancer regression. Infusions stopped in September due to this regression. Current side effects include dry mouth and fatigue. Regular monitoring for recurrence is ongoing. Recent esophageal dilatation and biopsy were performed; awaiting results. Continue regular monitoring for cancer recurrence and await biopsy results from the recent esophageal procedure.  I will see her back on a as needed basis.   Signed,  Gordy Bergamo, MD, [facility] 05/20/2023, 12:00 PM [facility] [address] [place], [place] [address] Phone: [phone]. Fax:  [phone]  "

## 2023-05-25 ENCOUNTER — Other Ambulatory Visit: Payer: Self-pay | Admitting: Nurse Practitioner

## 2023-05-25 DIAGNOSIS — R350 Frequency of micturition: Secondary | ICD-10-CM

## 2023-05-25 MED ORDER — NYSTATIN 100000 UNIT/GM EX CREA: 1.0000 | TOPICAL_CREAM | Freq: Two times a day (BID) | CUTANEOUS | 3 refills | Status: DC

## 2023-05-25 NOTE — Telephone Encounter (Signed)
 Last Fill: 04/28/23  Last OV: 05/19/23 Next OV: None Scheduled  Routing to provider for review/authorization.

## 2023-05-26 DIAGNOSIS — K219 Gastro-esophageal reflux disease without esophagitis: Secondary | ICD-10-CM | POA: Insufficient documentation

## 2023-06-01 ENCOUNTER — Telehealth: Payer: Self-pay | Admitting: Gastroenterology

## 2023-06-01 NOTE — Telephone Encounter (Signed)
 The pt has questions about a medication that was prescribed by her dermatologist.  I have advised that she call that office to discuss that particular med.  She will call that office and let us  know if they think she needs alternate PPI for reflux if it is determined that the med is causing worse reflux.

## 2023-06-01 NOTE — Telephone Encounter (Signed)
 Inbound call from patient, states she believes one of her medications is causing severe acid reflux. She would like someone to call her either before 2:30 PM or after 4:00 PM. Patient states she is going out of [place] soon and would like acid reflux to be resolved.

## 2023-06-02 ENCOUNTER — Ambulatory Visit: Payer: Self-pay

## 2023-06-02 NOTE — Telephone Encounter (Signed)
 This RN attempted 2nd call to patient. End of business day. Will route to office for further follow up.

## 2023-06-02 NOTE — Telephone Encounter (Signed)
 Copied from CRM [id number]. Topic: Clinical - Medical Advice >> Jun 02, 2023  4:37 PM Darlene [name] wrote: Reason for CRM: patient is tired all the time, she states its the worst it has ever been in weeks, she would like to speak to dr

## 2023-06-03 ENCOUNTER — Ambulatory Visit: Payer: Self-pay | Admitting: *Deleted

## 2023-06-03 ENCOUNTER — Other Ambulatory Visit: Payer: Self-pay | Admitting: Nurse Practitioner

## 2023-06-03 DIAGNOSIS — R350 Frequency of micturition: Secondary | ICD-10-CM

## 2023-06-03 NOTE — Telephone Encounter (Signed)
 Copied from CRM [id number]. Topic: Clinical - Red Word Triage >> Jun 03, 2023  8:48 AM Retta Caster wrote: Red Word that prompted transfer to Nurse Triage: Patient dehydrated 3-4 days/Mouth dry/every 2 hr Uranating/Sweating Reason for Disposition  Urinating more frequently than usual (i.e., frequency)  Answer Assessment - Initial Assessment Questions 1. SYMPTOM: "What's the main symptom you're concerned about?" (e.g., frequency, incontinence)     I'm having to pee a lot, every 2 hours.   Several times last year I had to go to the hospital for IV fluids.  I think I'm very dehydrated now.   I have a medication for dry mouth but it's not working well now.   The last 3-4 days my mouth is very dry.   Everything I drink is going straight through me.     I know I need hydration.    2. ONSET: "When did the  dehydration  start?"     Last 3-4 days.       I was on a treatment for cancer that caused extreme fatigue and dry mouth and eyes and my skin is very dry   It's very bad now the last 3-4 days.  It hit me yesterday I need an order for hydration.   It was too late yesterday by the time I called yesterday.    This has been going on for days.   I'm supposed to go to [place] this coming week for graduation.    3. PAIN: "Is there any pain?" If Yes, ask: "How bad is it?" (Scale: 1-10; mild, moderate, severe)     No 4. CAUSE: "What do you think is causing the symptoms?"     I'm very dehydrated. A nurse here at [facility] calls it in and I go in via ambulance for IV fluids.   5. OTHER SYMPTOMS: "Do you have any other symptoms?" (e.g., blood in urine, fever, flank pain, pain with urination)     My skin is very dry, my mouth is very dry and my eyes too.    6. PREGNANCY: "Is there any chance you are pregnant?" "When was your last menstrual period?"     N/A due to age  Protocols used: Urinary Symptoms-A-AH  Chief Complaint: severe dehydration.  Needing IV fluids.   Lives at [facility] and doesn't want to go to  the ED for IV fluids via ambulance.   The nurse at [facility] usually calls the ambulance.    Symptoms: Very dry mouth, eyes and severe fatigue.  I fall asleep very easily.  I was on a cancer medication that still causes me to have very dry mouth, eyes, skin and all.   I'm very dehydrated and need IV fluids.  "I missed seeing Man [name], NP yesterday when she was here at [facility].   I ended up warm transferring her to [facility] for additional assistance with this. Frequency: Last 3-4 days very dehydrated from a cancer medication Pertinent Negatives: Patient denies being dizzy but is very fatigued Disposition: [] ED /[] Urgent Care (no appt availability in office) / [] Appointment(In office/virtual)/ []  [facility]/ [] Home Care/ [] Refused Recommended Disposition /[] [facility]/ [x]  Follow-up with PCP Additional Notes: Call warm transferred into [facility].

## 2023-06-03 NOTE — Telephone Encounter (Signed)
 Patient called the office back and states that's she needs hydration because she's severely dehydrated. She's requesting sanction for hospital. She refuses to go in ambulance. Message routed to PCP [name], [name], NP

## 2023-06-03 NOTE — Telephone Encounter (Signed)
 Noted.

## 2023-06-03 NOTE — Telephone Encounter (Signed)
Called patient and no answer. Voicemail was left with office call back number.   

## 2023-06-03 NOTE — Telephone Encounter (Signed)
 [name], [name], NP  You16 minutes ago (10:01 AM)    I am placing CBC/diff, CMP/eGFR, UA C/S for today.

## 2023-06-03 NOTE — Telephone Encounter (Signed)
 Patient request that you call and speak to her. Message routed to [name], NP.

## 2023-06-03 NOTE — Telephone Encounter (Signed)
 Message routed to PCP [name], [name], NP . See previous encounter for patient as well.

## 2023-06-29 ENCOUNTER — Other Ambulatory Visit: Payer: Self-pay | Admitting: Nurse Practitioner

## 2023-06-30 ENCOUNTER — Other Ambulatory Visit: Payer: Self-pay | Admitting: Nurse Practitioner

## 2023-06-30 DIAGNOSIS — F411 Generalized anxiety disorder: Secondary | ICD-10-CM

## 2023-06-30 DIAGNOSIS — K219 Gastro-esophageal reflux disease without esophagitis: Secondary | ICD-10-CM

## 2023-06-30 NOTE — Telephone Encounter (Signed)
 Copied from CRM [id number]. Topic: Clinical - Medication Refill >> Jun 30, 2023  2:54 PM Tiffany H wrote: Medication: Zolpidem  Tartrate 3.5 MG SUBL [[id number]]  Has the patient contacted their pharmacy? Yes (Agent: If no, request that the patient contact the pharmacy for the refill. If patient does not wish to contact the pharmacy document the reason why and proceed with request.) (Agent: If yes, when and what did the pharmacy advise?)  This is the patient's preferred pharmacy:  [id number] - [address] [place] [address] Phone: [phone] Fax: [phone]  Is this the correct pharmacy for this prescription? Yes If no, delete pharmacy and type the correct one.   Has the prescription been filled recently? No  Is the patient out of the medication? Yes  Has the patient been seen for an appointment in the last year OR does the patient have an upcoming appointment? Yes  Can we respond through MyChart? Yes  Agent: Please be advised that Rx refills may take up to 3 business days. We ask that you follow-up with your pharmacy.

## 2023-06-30 NOTE — Telephone Encounter (Signed)
 Patient has request refill on medication Zolpidem . Medication last refilled 02/03/2023. Patient has no contract. Medication pend and sent to PCP [name], [name], NP for approval.

## 2023-07-01 MED ORDER — ZOLPIDEM TARTRATE 3.5 MG SL SUBL: SUBLINGUAL_TABLET | SUBLINGUAL | 1 refills | Status: DC

## 2023-07-01 NOTE — Telephone Encounter (Signed)
 Copied from CRM [id number]. Topic: Clinical - Medication Refill >> Jun 30, 2023  2:54 PM Tiffany H wrote: Medication: Zolpidem  Tartrate 3.5 MG SUBL [[id number]]  Has the patient contacted their pharmacy? Yes (Agent: If no, request that the patient contact the pharmacy for the refill. If patient does not wish to contact the pharmacy document the reason why and proceed with request.) (Agent: If yes, when and what did the pharmacy advise?)  This is the patient's preferred pharmacy:  [id number] - [address] [place] [address] Phone: [phone] Fax: [phone]  Is this the correct pharmacy for this prescription? Yes If no, delete pharmacy and type the correct one.   Has the prescription been filled recently? No  Is the patient out of the medication? Yes  Has the patient been seen for an appointment in the last year OR does the patient have an upcoming appointment? Yes  Can we respond through MyChart? Yes  Agent: Please be advised that Rx refills may take up to 3 business days. We ask that you follow-up with your pharmacy. >> Jun 30, 2023  4:58 PM Tilton Fontan wrote: [place] [facility] called to request a revised prescription for patient's Ambien  (Zolpidem ) 3.5mg . As written, prescription was returned and produced error message and outlined the need for a paper escript to be walked into pharmacy.  Please update prescription notes to advise whether or not patient is eligible for e-escript or if patient must walk in prescription.   It is out of stock and cannot be re-ordered until prescription updated

## 2023-07-01 NOTE — Telephone Encounter (Signed)
Message routed to PCP [name], [name], NP

## 2023-07-12 ENCOUNTER — Other Ambulatory Visit: Payer: Self-pay

## 2023-07-12 DIAGNOSIS — E7800 Pure hypercholesterolemia, unspecified: Secondary | ICD-10-CM

## 2023-07-12 MED ORDER — PRAVASTATIN SODIUM 40 MG PO TABS: 40.0000 mg | ORAL_TABLET | Freq: Every day | ORAL | 3 refills | Status: AC

## 2023-07-14 ENCOUNTER — Encounter: Payer: Self-pay | Admitting: Nurse Practitioner

## 2023-07-14 ENCOUNTER — Ambulatory Visit: Admitting: Nurse Practitioner

## 2023-07-14 VITALS — BP 134/72 | HR 60 | Temp 97.6°F | Resp 20 | Ht 63.0 in | Wt 157.4 lb

## 2023-07-14 DIAGNOSIS — R5383 Other fatigue: Secondary | ICD-10-CM

## 2023-07-14 DIAGNOSIS — K219 Gastro-esophageal reflux disease without esophagitis: Secondary | ICD-10-CM

## 2023-07-14 DIAGNOSIS — E039 Hypothyroidism, unspecified: Secondary | ICD-10-CM | POA: Diagnosis not present

## 2023-07-14 DIAGNOSIS — I10 Essential (primary) hypertension: Secondary | ICD-10-CM | POA: Diagnosis not present

## 2023-07-14 DIAGNOSIS — G4733 Obstructive sleep apnea (adult) (pediatric): Secondary | ICD-10-CM

## 2023-07-14 NOTE — Assessment & Plan Note (Signed)
 Evaluated, not using CPAP

## 2023-07-14 NOTE — Progress Notes (Signed)
 Location:   [facility]   Place of Service:    Provider: Larwance Hark NP  Yaneth Fairbairn X, NP  Patient Care Team: Jaydian Santana X, NP as PCP - General (Internal Medicine) Ladona Heinz, MD as PCP - Cardiology (Cardiology) Clark-Burning, Delon, PA-C (Inactive) (Dermatology) Ladona Heinz, MD as Consulting Physician (Cardiology) Mansouraty, Aloha Raddle., MD as Consulting Physician (Gastroenterology) Ethyl Lonni BRAVO, MD (Inactive) as Consulting Physician (Otolaryngology) Porter Andrez SAUNDERS, PA-C as Physician Assistant (Dermatology) Plonk, Bard Gander, MD as Referring Physician (Otolaryngology)  Extended Emergency Contact Information Primary Emergency Contact: Sink,John Home Phone: [phone] Relation: Brother Secondary Emergency Contact: Okey Ronal Pulling Home Phone: [phone] Relation: Friend  Code Status:  DNR Goals of care: Advanced Directive information    07/14/2023    2:47 PM  Advanced Directives  Does Patient Have a Medical Advance Directive? Yes  Type of Estate agent of [place];Living will  Does patient want to make changes to medical advance directive? No - Patient declined  Copy of Healthcare Power of Attorney in Chart? No - copy requested     Chief Complaint  Patient presents with  . Fatigue    Urine leakage,unable to function.    HPI:  Pt is a 89 y.o. female seen today for feeling fatigue, suspected dehydration since urinary frequency and incontinent-saw urology and started Detrol-on hold.    Dry mouth, didn't tolerate Cevimeline, cause frequent BM, night sweat, fatigue, wants to eliminate Ambien  to avoid day time sleepiness. Now special mouth wash works well.               Resolved pain allover after immunotherapy infusion,, cancer center recommended Tylenol  up to 3000mg  a day for pain.                          After 2nd Shingrix, feels aches, headache, more nasal/ear congestion.   05/06/23 ED eval SOB, attributed to GERD-discharged  with return precaution,  followed by GI, EGD 05/11/23, Dr. Lesly, no gross lesions, dilated, biopsied.  07/21/22 ED eval, dehydration, gum swelling             Malignant melanoma of nasal cavity, followed by oncology, ENT, dental team, remission.              Anxiety, chronic              Sleeps too much during day, failed dc Ambien  in the past,  off Valium , refused CPAP, feels a veil in front of left eye, more noticeable left facial weakness Hx of chronic rhinitis/sphenoidal sinusitis, s/p surgery, improved SOB, wheezing, but still has nasal/right ear congestion, on Astelin , Flonase , Bronchodilator, underwent Pulmonology, ENT, GI, Allergy  evaluation, had spirometry 10/20/20, Echo 60-65% EF 04/16/20, CTA 09/01/20. Saw ENT             Chronic sore throat: multiple factorials, GERD vs chronic rhinitis/sinusitis,  ENT  Insomnia trial of dc Ambien              Hx of CVA, on plavix . Statin,  residual of  poor left peripheral vision  Hypothyroidism, on Levothyroxine -declined dose adjustment.  TSH 0.168 02/18/22, 0.77 03/14/23             HTN, blood pressure is controlled on Losartan  qd. added Imdur  30mg  tid by Cardiology. Followed by Cardiology. CTA negative 09/01/20. Bun/creat 15/0.45 05/06/23            GERD, stable Omeprazole , FDgard helped,  saw GI, EGD 12/26/19 and  05/11/23(dilated, no gross lesions)Vit B12 383 12/02/20, Hgb 13.8 05/06/23.             Lactose intolerance, diet adjustment, f/u GI             Esophageal dysmotility, Swallow studay 05/12/20, worked with ST, GI f/u             OSA sleep study, saw Pulmonology.  Not using CPAP                                     OA, saw Ortho. Cervical spinal stenosis, MR 02/26/20, C6-C7. Delaying  R mensicus tender surgery, Tramadol, Advil.              Hyperlipidemia, takes Pravastatin , LDL 94 05/12/21             Urinary frequency/leakage, 2-3x/night, stopped taking Gemtesa , acupuncture improved initially. Saw urology. treated with Augmentin  04/19/23, urine culture  showed no growth 04/19/23, persisted with symptoms             Osteopenia, DEXA 12/25/20, t score -1.0,  10 years possibility major fx 11%, hip fx 2.6%. Vit D, Cal-diet rich,  Vit D 3 39 12/26/20             HOH audiology eval, ENT placed tube in the right ear-failed-again, hearing aids    Past Medical History:  Diagnosis Date  . Anxiety   . Atypical mole 11/11/2020   Left Inner Knee (moderate-free)  . Colon polyps   . Depression   . Hyperlipidemia   . Hypertension   . Hypothyroidism   . Melanoma in situ (HCC) 09/19/2018   Right Arm (excision)  . Skin cancer   . Sleep apnea   . Stroke [facility]) 2008   Past Surgical History:  Procedure Laterality Date  . ADENOIDECTOMY    . APPENDECTOMY  1960  . CATARACT EXTRACTION Bilateral   . COLONOSCOPY    . ESOPHAGOGASTRODUODENOSCOPY N/A 05/11/2023   Procedure: EGD (ESOPHAGOGASTRODUODENOSCOPY);  Surgeon: Wilhelmenia Aloha Raddle., MD;  Location: [facility];  Service: Gastroenterology;  Laterality: N/A;  . GUM SURGERY  03/2019  . LUMBAR DISC SURGERY  2014  . MELANOMA EXCISION  2020  . MESH APPLIED TO LAP PORT    . SINUS EXPLORATION     11/17/2021 operation for right nasal cavity and 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  . TONSILLECTOMY  1940    Allergies  Allergen Reactions  . Lactose Intolerance (Gi) Other (See Comments)    indigestion  . Norco [Hydrocodone-Acetaminophen ] Nausea And Vomiting  . Dust Mite Extract Other (See Comments)    Sinus drainage  . Oxycontin [Oxycodone Hcl] Nausea And Vomiting  . Pollen Extract Other (See Comments)    Sinus drainage    Allergies as of 07/14/2023       Reactions   Lactose Intolerance (gi) Other (See Comments)   indigestion   Norco [hydrocodone-acetaminophen ] Nausea And Vomiting   Dust Mite Extract Other (See Comments)   Sinus drainage   Oxycontin [oxycodone Hcl] Nausea And Vomiting   Pollen Extract Other (See Comments)   Sinus drainage        Medication List         Accurate as of July 14, 2023  4:01 PM. If you have any questions, ask your nurse or doctor.          ammonium lactate 12 % cream Commonly  known as: AMLACTIN Apply 1 Application topically as needed.   azelastine  0.1 % nasal spray Commonly known as: ASTELIN  Place into both nostrils daily. Use in each nostril as directed   clopidogrel  75 MG tablet Commonly known as: PLAVIX  TAKE 1 TABLET BY MOUTH EVERY DAY   clotrimazole-betamethasone  cream Commonly known as: LOTRISONE Apply 1 Application topically 2 (two) times daily.   hydrocortisone  1 % lotion Apply 1 Application topically 2 (two) times daily.   ibuprofen 400 MG tablet Commonly known as: ADVIL Take 400 mg by mouth every 6 (six) hours as needed for moderate pain (pain score 4-6).   ketoconazole  2 % shampoo Commonly known as: NIZORAL  APPLY TOPICALLY 2 TIMES A WEEK   losartan  50 MG tablet Commonly known as: COZAAR  Take 1 tablet (50 mg total) by mouth daily.   nitroGLYCERIN  0.4 MG SL tablet Commonly known as: NITROSTAT  Place 1 tablet (0.4 mg total) under the tongue every 5 (five) minutes as needed for up to 25 days for chest pain.   nystatin  cream Commonly known as: MYCOSTATIN  Apply 1 Application topically 2 (two) times daily.   omeprazole  40 MG capsule Commonly known as: PRILOSEC Take 1 capsule (40 mg total) by mouth as needed.   oxymetazoline  0.05 % nasal spray Commonly known as: Afrin Nasal Spray Place 1 spray into both nostrils 2 (two) times daily.   PILOCARPINE HCL PO Take 2 mLs by mouth.   pravastatin  40 MG tablet Commonly known as: PRAVACHOL  Take 1 tablet (40 mg total) by mouth daily.   Synthroid  100 MCG tablet Generic drug: levothyroxine  TAKE 1 TABLET BY MOUTH EVERY DAY BEFORE BREAKFAST   Vitamin D (Ergocalciferol) 1.25 MG (50000 UNIT) Caps capsule Commonly known as: DRISDOL TAKE 1 CAPSULE BY MOUTH ONE TIME PER WEEK   Zolpidem  Tartrate 3.5 MG Subl TAKE 1 TABLET BY MOUTH EVERY DAY AT  BEDTIME AS NEEDED   Zolpidem  Tartrate 3.5 MG Subl TAKE 1 TABLET BY MOUTH EVERY DAY AT BEDTIME AS NEEDED        Review of Systems  Constitutional:  Positive for fatigue. Negative for appetite change and fever.  HENT:  Positive for hearing loss. Negative for congestion and voice change.        Chronic sinusitis, underwent ENT, sleep study-sleep apnea.  Eyes:  Positive for visual disturbance.       Left lateral visual field deficit. Dry eyes  Respiratory:  Negative for cough and shortness of breath.   Cardiovascular:  Negative for leg swelling.  Gastrointestinal:  Negative for abdominal pain and constipation.  Genitourinary:  Positive for frequency. Negative for hematuria and urgency.       Urinary leakage, urinary frequency 2-3x/night. Doing Kegel exercise.   Musculoskeletal:  Positive for arthralgias. Negative for back pain and gait problem.       R knee needs replacement.   Skin:  Negative for color change.       Mohs dorsum right hand, healed.   Neurological:  Negative for speech difficulty, weakness and headaches.  Psychiatric/Behavioral:  Positive for sleep disturbance. Negative for dysphoric mood. The patient is not nervous/anxious.        Early am awake, difficulty returning asleep.     Immunization History  Administered Date(s) Administered  . Hepatitis A 04/04/2000  . Influenza-Unspecified 10/18/2014  . Moderna Sars-Covid-2 Vaccination 01/22/2019, 02/19/2019, 06/05/2020  . Pneumococcal Conjugate-13 12/20/2018  . Pneumococcal Polysaccharide-23 01/18/2005  . Tdap 11/25/2008  . Zoster Recombinant(Shingrix) 12/20/2018, 01/20/2022, 09/15/2022   Pertinent  Health Maintenance Due  Topic Date Due  . Colonoscopy  10/25/2021  . INFLUENZA VACCINE  08/19/2023  . DEXA SCAN  Completed      11/30/2022   11:22 AM 12/09/2022    3:27 PM 12/30/2022   12:59 PM 04/19/2023    3:14 PM 05/19/2023    1:44 PM  Fall Risk  Falls in the past year? 0 0 0 0 0  Was there an injury with Fall?  0 0 0 0 0  Fall Risk Category Calculator 0 0 0 0 0  Patient at Risk for Falls Due to No Fall Risks   No Fall Risks No Fall Risks  Fall risk Follow up Falls evaluation completed;Education provided;Falls prevention discussed   Falls evaluation completed Falls evaluation completed   Functional Status Survey:    Vitals:   07/14/23 1452  BP: 134/72  Pulse: 60  Resp: 20  Temp: 97.6 F (36.4 C)  SpO2: 96%  Weight: 157 lb 6.4 oz (71.4 kg)  Height: 5' 3 (1.6 m)   Body mass index is 27.88 kg/m. Physical Exam Vitals and nursing note reviewed.  Constitutional:      Appearance: Normal appearance.  HENT:     Head: Normocephalic and atraumatic.     Ears:     Comments: Right ear tube placed, mild erythema, no bleeding.     Nose: Nose normal.     Mouth/Throat:     Mouth: Mucous membranes are moist.   Eyes:     Extraocular Movements: Extraocular movements intact.     Conjunctiva/sclera: Conjunctivae normal.     Pupils: Pupils are equal, round, and reactive to light.     Comments: Left peripheral visual field deficit since CVA 2006   Cardiovascular:     Rate and Rhythm: Normal rate and regular rhythm.     Heart sounds: No murmur heard. Pulmonary:     Effort: Pulmonary effort is normal.     Breath sounds: No rales.  Abdominal:     General: Bowel sounds are normal.     Palpations: Abdomen is soft.     Tenderness: There is no abdominal tenderness.   Musculoskeletal:        General: Tenderness present.     Cervical back: Normal range of motion and neck supple.     Right lower leg: No edema.     Left lower leg: No edema.     Comments: R knee pain when standing, no s/s of infection or injury.    Skin:    General: Skin is warm and dry.   Neurological:     General: No focal deficit present.     Mental Status: She is alert and oriented to person, place, and time. Mental status is at baseline.     Motor: Weakness present.     Gait: Gait normal.     Comments: Left facial weakness   Psychiatric:        Mood and Affect: Mood normal.        Behavior: Behavior normal.        Thought Content: Thought content normal.        Judgment: Judgment normal.    Labs reviewed: Recent Labs    09/16/22 1510 04/19/23 1546 05/06/23 1114  NA 139 142 142  K 4.0 4.3 3.9  CL 106 106 111  CO2 26 26 23   GLUCOSE 119* 87 116*  BUN 14 17 15   CREATININE 0.65 0.75 0.45  CALCIUM 9.3 10.0 9.5   Recent Labs  07/21/22 0522 05/06/23 1114  AST 18 16  ALT 16 14  ALKPHOS 67 60  BILITOT 0.7 0.7  PROT 6.7 6.3*  ALBUMIN 3.9 3.6   Recent Labs    07/21/22 0522 09/16/22 1510 04/19/23 1546 05/06/23 1114  WBC 6.3 9.7 7.0 5.6  NEUTROABS 3.5  --   --  3.0  HGB 13.7 14.3 14.7 13.8  HCT 41.4 43.9 43.6 42.9  MCV 90.4 91.1 88.1 92.1  PLT 243 247 275 231   Lab Results  Component Value Date   TSH 0.77 03/14/2023   Lab Results  Component Value Date   HGBA1C 6.1 (H) 06/07/2021   Lab Results  Component Value Date   CHOL 163 05/12/2021   HDL 44 05/12/2021   LDLCALC 94 05/12/2021   TRIG 144 05/12/2021   CHOLHDL 3.5 12/26/2019    Significant Diagnostic Results in last 30 days:  No results found.  Assessment/Plan  Essential hypertension  Dry mouth, didn't tolerate Cevimeline, cause frequent BM, night sweat, fatigue, wants to eliminate Ambien  to avoid day time sleepiness. Now special mouth wash works well.               Resolved pain allover after immunotherapy infusion,, cancer center recommended Tylenol  up to 3000mg  a day for pain.                          After 2nd Shingrix, feels aches, headache, more nasal/ear congestion.   05/06/23 ED eval SOB, attributed to GERD-discharged with return precaution,  followed by GI, EGD 05/11/23, Dr. Lesly, no gross lesions, dilated, biopsied.  07/21/22 ED eval, dehydration, gum swelling             Malignant melanoma of nasal cavity, followed by oncology, ENT, dental team, remission.              Anxiety, chronic              Sleeps  too much during day, failed dc Ambien  in the past,  off Valium , refused CPAP, feels a veil in front of left eye, more noticeable left facial weakness Hx of chronic rhinitis/sphenoidal sinusitis, s/p surgery, improved SOB, wheezing, but still has nasal/right ear congestion, on Astelin , Flonase , Bronchodilator, underwent Pulmonology, ENT, GI, Allergy  evaluation, had spirometry 10/20/20, Echo 60-65% EF 04/16/20, CTA 09/01/20. Saw ENT             Chronic sore throat: multiple factorials, GERD vs chronic rhinitis/sinusitis,  ENT  Insomnia trial of dc Ambien              Hx of CVA, on plavix . Statin,  residual of  poor left peripheral vision  Hypothyroidism, on Levothyroxine -declined dose adjustment.  TSH 0.168 02/18/22, 0.77 03/14/23             HTN, blood pressure is controlled on Losartan  qd. added Imdur  30mg  tid by Cardiology. Followed by Cardiology. CTA negative 09/01/20. Bun/creat 15/0.45 05/06/23            GERD, stable Omeprazole , FDgard helped,  saw GI, EGD 12/26/19 and 05/11/23(dilated, no gross lesions)Vit B12 383 12/02/20, Hgb 13.8 05/06/23             Lactose intolerance, diet adjustment, f/u GI             Esophageal dysmotility, Swallow studay 05/12/20, worked with ST, GI f/u             OSA sleep study, saw Pulmonology.  Not using CPAP  OA, saw Ortho. Cervical spinal stenosis, MR 02/26/20, C6-C7. Delaying  R mensicus tender surgery, Tramadol, Advil.              Hyperlipidemia, takes Pravastatin , LDL 94 05/12/21             Urinary frequency/leakage, 2-3x/night, stopped taking Gemtesa , acupuncture improved initially. Saw urology. treated with Augmentin  04/19/23, urine culture showed no growth 04/19/23, persisted with symptoms             Osteopenia, DEXA 12/25/20, t score -1.0,  10 years possibility major fx 11%, hip fx 2.6%. Vit D, Cal-diet rich,  Vit D 3 39 12/26/20             HOH audiology eval, ENT placed tube in the right ear-failed-again, hearing aids    OSA  (obstructive sleep apnea) Evaluated, not using CPAP  Hypothyroidism on Levothyroxine -declined dose adjustment.  TSH 0.168 02/18/22, 0.77 03/14/23  Gastroesophageal reflux disease stable Omeprazole , FDgard helped,  saw GI, EGD 12/26/19 and 05/11/23(dilated, no gross lesions)Vit B12 383 12/02/20, Hgb 13.8 05/06/23    Fatigue Feels dehydrated, requesting IVF in [facility] instead of hospital/cancer center-the patient declined admission to SNF for IVF and CBC/diff, CMP/eGFR, self stopped taking Detrol.  Advised the patient to ED or cancer center for eval and tx   Family/ staff Communication: Plan of care reviewed with patient.  Labs/tests ordered:  CBC/diff, CMP/eGFR

## 2023-07-14 NOTE — Assessment & Plan Note (Addendum)
 Feels dehydrated, requesting IVF in [facility] instead of hospital/cancer center-the patient declined admission to SNF for IVF and CBC/diff, CMP/eGFR, self stopped taking Detrol.  Advised the patient to ED or cancer center for eval and tx

## 2023-07-14 NOTE — Assessment & Plan Note (Addendum)
 stable Omeprazole , FDgard helped,  saw GI, EGD 12/26/19 and 05/11/23(dilated, no gross lesions)Vit B12 383 12/02/20, Hgb 13.8 05/06/23

## 2023-07-14 NOTE — Assessment & Plan Note (Signed)
 on Levothyroxine-declined dose adjustment.  TSH 0.168 02/18/22, 0.77 03/14/23

## 2023-07-14 NOTE — Assessment & Plan Note (Signed)
 Dry mouth, didn't tolerate Cevimeline, cause frequent BM, night sweat, fatigue, wants to eliminate Ambien  to avoid day time sleepiness. Now special mouth wash works well.               Resolved pain allover after immunotherapy infusion,, cancer center recommended Tylenol  up to 3000mg  a day for pain.                          After 2nd Shingrix, feels aches, headache, more nasal/ear congestion.   05/06/23 ED eval SOB, attributed to GERD-discharged with return precaution,  followed by GI, EGD 05/11/23, Dr. Lesly, no gross lesions, dilated, biopsied.  07/21/22 ED eval, dehydration, gum swelling             Malignant melanoma of nasal cavity, followed by oncology, ENT, dental team, remission.              Anxiety, chronic              Sleeps too much during day, failed dc Ambien  in the past,  off Valium , refused CPAP, feels a veil in front of left eye, more noticeable left facial weakness Hx of chronic rhinitis/sphenoidal sinusitis, s/p surgery, improved SOB, wheezing, but still has nasal/right ear congestion, on Astelin , Flonase , Bronchodilator, underwent Pulmonology, ENT, GI, Allergy  evaluation, had spirometry 10/20/20, Echo 60-65% EF 04/16/20, CTA 09/01/20. Saw ENT             Chronic sore throat: multiple factorials, GERD vs chronic rhinitis/sinusitis,  ENT  Insomnia trial of dc Ambien              Hx of CVA, on plavix . Statin,  residual of  poor left peripheral vision  Hypothyroidism, on Levothyroxine -declined dose adjustment.  TSH 0.168 02/18/22, 0.77 03/14/23             HTN, blood pressure is controlled on Losartan  qd. added Imdur  30mg  tid by Cardiology. Followed by Cardiology. CTA negative 09/01/20. Bun/creat 15/0.45 05/06/23            GERD, stable Omeprazole , FDgard helped,  saw GI, EGD 12/26/19 and 05/11/23(dilated, no gross lesions)Vit B12 383 12/02/20, Hgb 13.8 05/06/23             Lactose intolerance, diet adjustment, f/u GI             Esophageal dysmotility, Swallow studay 05/12/20, worked with  ST, GI f/u             OSA sleep study, saw Pulmonology.  Not using CPAP                                     OA, saw Ortho. Cervical spinal stenosis, MR 02/26/20, C6-C7. Delaying  R mensicus tender surgery, Tramadol, Advil.              Hyperlipidemia, takes Pravastatin , LDL 94 05/12/21             Urinary frequency/leakage, 2-3x/night, stopped taking Gemtesa , acupuncture improved initially. Saw urology. treated with Augmentin  04/19/23, urine culture showed no growth 04/19/23, persisted with symptoms             Osteopenia, DEXA 12/25/20, t score -1.0,  10 years possibility major fx 11%, hip fx 2.6%. Vit D, Cal-diet rich,  Vit D 3 39 12/26/20             HOH audiology eval, ENT placed  tube in the right ear-failed-again, hearing aids

## 2023-07-15 ENCOUNTER — Ambulatory Visit: Payer: Self-pay

## 2023-07-15 NOTE — Telephone Encounter (Addendum)
   07/15/23 11:18-2nd attempt, lvmtcb  07/15/23 10:29-1st attempt, lvmtcb   Per OV note from 07/14/23 Man [name] NP advised ED or cancer center for eval and tx.   Copied from CRM [id number]. Topic: Clinical - Medical Advice >> Jul 15, 2023  8:42 AM Zane F wrote: Reason for CRM:   Patient called in after discussing with her PCP the need for a IV for hydration due to the patient being dehydrated. The patient stated that her insurance won't cover her getting the infusion at [facility] facility [facility]. There are currently no nurse beds available anyhow. The patient just wanted to put in place a callback for further discussion.   Callback Number: [phone]

## 2023-07-15 NOTE — Telephone Encounter (Signed)
  FYI Only or Action Required?: Action required by provider: follow up needed, no contact attempts X 3.  Patient was last seen in primary care on 07/14/2023 by [name], [name], NP. Called Nurse Triage reporting Dehydration follow up discussion. Symptoms began NA. Interventions attempted: Other: NA. Symptoms are: NA.  Triage Disposition: No Contact Calls  Patient/caregiver understands and will follow disposition?:   Reason for Disposition  Third attempt to contact caller AND no contact made. Phone number verified.  Message left on identified voice mail  Answer Assessment - Initial Assessment Questions 3 attempts no contact  Protocols used: No Contact or Duplicate Contact Call-A-AH

## 2023-07-15 NOTE — Telephone Encounter (Signed)
 Please Advise see triage notes from triage nurse.  Message sent to [name], [name], NP

## 2023-07-20 ENCOUNTER — Telehealth: Payer: Self-pay | Admitting: Physician Assistant

## 2023-07-20 NOTE — Telephone Encounter (Signed)
 The pt has been advised of anti reflux precautions.  She will aslo add Pepcid at bedtime.  She will keep appt as planned for appt as scheduled.

## 2023-07-20 NOTE — Telephone Encounter (Signed)
-  Pt came into the office complaining about uncontrolled GERD appt sched for 07-29-23 but pt would like to know what she could up until her appt please advise.

## 2023-07-21 ENCOUNTER — Telehealth: Payer: Self-pay

## 2023-07-21 ENCOUNTER — Telehealth: Payer: Self-pay | Admitting: Cardiology

## 2023-07-21 NOTE — Telephone Encounter (Signed)
   Pre-operative Risk Assessment    Patient Name: Rabecka Brendel  DOB: Jun 27, 1933 MRN: [id number]   Date of last office visit: 05/20/2023 Date of next office visit: TBD   Request for Surgical Clearance    Procedure:  PNE  Date of Surgery:  Clearance 08/23/23                                Surgeon: Dr. Gaston Surgeon's Group or Practice Name:  [facility]  Phone number:  [phone] EXT: [phone] Fax number:  [phone]   Type of Clearance Requested:   - Medical  - Pharmacy:  Hold Clopidogrel  (Plavix ) 5 days prior    Type of Anesthesia:  MAC   Additional requests/questions:    Bonney Bernarda JONETTA Melvenia   07/21/2023, 2:40 PM

## 2023-07-21 NOTE — Telephone Encounter (Signed)
   Name: Darlene [name]  DOB: Aug 11, 1933  MRN: [id number]  Primary Cardiologist: Gordy Bergamo, MD   Preoperative team, please contact this patient and set up a phone call appointment for further preoperative risk assessment. Please obtain consent and complete medication review. Thank you for your help.  I confirm that guidance regarding antiplatelet and oral anticoagulation therapy has been completed and, if necessary, noted below.  Patient takes Plavix  for history of CVA. This is not managed by cardiology. Recommendations for holding Plavix  prior to procedure to come from managing provider (PCP).   I also confirmed the patient resides in the state of [place] . As per [facility] Medical Board telemedicine laws, the patient must reside in the state in which the provider is licensed.   Lum LITTIE Louis, NP 07/21/2023, 4:23 PM  [facility]

## 2023-07-21 NOTE — Telephone Encounter (Signed)
 Patient has been scheduled for televisit med rec and consent done    Patient Consent for Virtual Visit         Darlene [name] has provided verbal consent on 07/21/2023 for a virtual visit (video or telephone).   CONSENT FOR VIRTUAL VISIT FOR:  Darlene [name]  By participating in this virtual visit I agree to the following:  I hereby voluntarily request, consent and authorize [facility] and its employed or contracted physicians, physician assistants, nurse practitioners or other licensed health care professionals (the Practitioner), to provide me with telemedicine health care services (the "Services) as deemed necessary by the treating Practitioner. I acknowledge and consent to receive the Services by the Practitioner via telemedicine. I understand that the telemedicine visit will involve communicating with the Practitioner through live audiovisual communication technology and the disclosure of certain medical information by electronic transmission. I acknowledge that I have been given the opportunity to request an in-person assessment or other available alternative prior to the telemedicine visit and am voluntarily participating in the telemedicine visit.  I understand that I have the right to withhold or withdraw my consent to the use of telemedicine in the course of my care at any time, without affecting my right to future care or treatment, and that the Practitioner or I may terminate the telemedicine visit at any time. I understand that I have the right to inspect all information obtained and/or recorded in the course of the telemedicine visit and may receive copies of available information for a reasonable fee.  I understand that some of the potential risks of receiving the Services via telemedicine include:  Delay or interruption in medical evaluation due to technological equipment failure or disruption; Information transmitted may not be sufficient (e.g. poor resolution of images) to  allow for appropriate medical decision making by the Practitioner; and/or  In rare instances, security protocols could fail, causing a breach of personal health information.  Furthermore, I acknowledge that it is my responsibility to provide information about my medical history, conditions and care that is complete and accurate to the best of my ability. I acknowledge that Practitioner's advice, recommendations, and/or decision may be based on factors not within their control, such as incomplete or inaccurate data provided by me or distortions of diagnostic images or specimens that may result from electronic transmissions. I understand that the practice of medicine is not an exact science and that Practitioner makes no warranties or guarantees regarding treatment outcomes. I acknowledge that a copy of this consent can be made available to me via my patient portal [facility] MyChart), or I can request a printed copy by calling the office of [facility].    I understand that my insurance will be billed for this visit.   I have read or had this consent read to me. I understand the contents of this consent, which adequately explains the benefits and risks of the Services being provided via telemedicine.  I have been provided ample opportunity to ask questions regarding this consent and the Services and have had my questions answered to my satisfaction. I give my informed consent for the services to be provided through the use of telemedicine in my medical care

## 2023-07-21 NOTE — Telephone Encounter (Signed)
 Patient has been scheduled for televisit med rec and consent done

## 2023-07-22 ENCOUNTER — Encounter (HOSPITAL_COMMUNITY): Payer: Self-pay

## 2023-07-22 ENCOUNTER — Other Ambulatory Visit: Payer: Self-pay

## 2023-07-22 ENCOUNTER — Emergency Department (HOSPITAL_COMMUNITY)
Admission: EM | Admit: 2023-07-22 | Discharge: 2023-07-22 | Disposition: A | Discharge status: Home / Self Care | Attending: Emergency Medicine | Admitting: Emergency Medicine

## 2023-07-22 DIAGNOSIS — R001 Bradycardia, unspecified: Secondary | ICD-10-CM | POA: Diagnosis not present

## 2023-07-22 DIAGNOSIS — R350 Frequency of micturition: Secondary | ICD-10-CM | POA: Insufficient documentation

## 2023-07-22 LAB — CBC
HCT: 40.2 % (ref 36.0–46.0)
Hemoglobin: 13.1 g/dL (ref 12.0–15.0)
MCH: 29.6 pg (ref 26.0–34.0)
MCHC: 32.6 g/dL (ref 30.0–36.0)
MCV: 91 fL (ref 80.0–100.0)
Platelets: 214 K/uL (ref 150–400)
RBC: 4.42 MIL/uL (ref 3.87–5.11)
RDW: 12.7 % (ref 11.5–15.5)
WBC: 7.5 K/uL (ref 4.0–10.5)
nRBC: 0 % (ref 0.0–0.2)

## 2023-07-22 LAB — BASIC METABOLIC PANEL WITH GFR
Anion gap: 7 (ref 5–15)
BUN: 20 mg/dL (ref 8–23)
CO2: 25 mmol/L (ref 22–32)
Calcium: 9.4 mg/dL (ref 8.9–10.3)
Chloride: 111 mmol/L (ref 98–111)
Creatinine, Ser: 0.76 mg/dL (ref 0.44–1.00)
GFR, Estimated: >60 mL/min (ref >60)
Glucose, Bld: 96 mg/dL (ref 70–99)
Potassium: 4.1 mmol/L (ref 3.5–5.1)
Sodium: 143 mmol/L (ref 135–145)

## 2023-07-22 LAB — URINALYSIS, ROUTINE W REFLEX MICROSCOPIC
Bilirubin Urine: NEGATIVE
Glucose, UA: NEGATIVE mg/dL
Hgb urine dipstick: NEGATIVE
Ketones, ur: NEGATIVE mg/dL
Leukocytes,Ua: NEGATIVE
Nitrite: NEGATIVE
Protein, ur: NEGATIVE mg/dL
Specific Gravity, Urine: 1.014 (ref 1.005–1.030)
pH: 7 (ref 5.0–8.0)

## 2023-07-22 NOTE — ED Notes (Signed)
 [facility] and talked to a gentleman

## 2023-07-22 NOTE — ED Notes (Signed)
 Pt asked again if she could go home by cab. Pt requested to talk to the charge RN. Charge RN talked to pt and confirmed pt was returning to the independent living at [facility]. Charge RN informed pt that she would call her a cab and provide a cab voucher.

## 2023-07-22 NOTE — ED Triage Notes (Addendum)
 BIB EMS for dizziness started today. From [facility]. Polyuria 12 times a day approx. Has had this issue before. Negative orthostatics. 132/88 BP 78 HR 17 RR CBG 120. She reports she normally drinks lemonade but was told not to drink it per GI dr who is working on her acid reflux.

## 2023-07-22 NOTE — ED Notes (Signed)
 Pt ambulated to RN desk stating that she had been waiting some time and asking if she could call her own cab. Pt informed of hospital policy and that staff needed to arrange transport back to her facility. Pt informed transport had been arranged but was unsure when they would be here. Pt offered food and water while she waited, but pt declined. Pt returned to her bed.

## 2023-07-22 NOTE — ED Notes (Signed)
 [facility] called again. They are still waiting on the transport to be available to pick up the pt. They were unable to give a specific time frame for pt transport. Charge RN consulted and [facility] called again. Pt to be transported by whichever transport is available sooner.

## 2023-07-22 NOTE — ED Provider Notes (Signed)
 [place] EMERGENCY DEPARTMENT AT [facility] Provider Note   CSN: [id number] Arrival date & time: 07/22/23  1446     Patient presents with: Urinary Frequency   Darlene [name] is a 89 y.o. female.   89 year old female presenting with urinary frequency.  Patient reports that urinary frequency has been worsening for several weeks, she sees urology for this issue but is not currently on any medications for regulation of her symptoms, however she has been on some in the past.  Patient reports that she has come to the emergency department today because she is fed up with these issues, she describes urinary frequency where she has little warning before needing to urinate, she has difficulty sleeping at night because of this.  Patient also reports having issues with silent GERD, states that she has dull abdominal discomfort that she correlates with her GERD, she recently saw gastroenterology for this issue and was told which foods to eliminate from her diet and is on omeprazole .  Denies fever, dysuria, change in color/odor of urine, diarrhea, nausea/vomiting, back pain, paresthesias, chest pain/shortness of breath.   Urinary Frequency       Prior to Admission medications   Medication Sig Start Date End Date Taking? Authorizing Provider  ammonium lactate (AMLACTIN) 12 % cream Apply 1 Application topically as needed. 04/14/23   [provider]  azelastine  (ASTELIN ) 0.1 % nasal spray Place into both nostrils daily. Use in each nostril as directed    [provider]  clopidogrel  (PLAVIX ) 75 MG tablet TAKE 1 TABLET BY MOUTH EVERY DAY 06/29/23   [name], [name], NP  clotrimazole-betamethasone  (LOTRISONE) cream Apply 1 Application topically 2 (two) times daily.    [provider]  hydrocortisone  1 % lotion Apply 1 Application topically 2 (two) times daily. Patient not taking: Reported on 07/14/2023 04/28/23   [name], [name], NP  ibuprofen (ADVIL) 400 MG tablet Take  400 mg by mouth every 6 (six) hours as needed for moderate pain (pain score 4-6).    [provider]  ketoconazole  (NIZORAL ) 2 % shampoo APPLY TOPICALLY 2 TIMES A WEEK 02/28/23   [name], [name], NP  losartan  (COZAAR ) 50 MG tablet Take 1 tablet (50 mg total) by mouth daily. 12/21/22 12/10/24  [name], [name], NP  nitroGLYCERIN  (NITROSTAT ) 0.4 MG SL tablet Place 1 tablet (0.4 mg total) under the tongue every 5 (five) minutes as needed for up to 25 days for chest pain. 12/21/21 01/19/48  Osa Grate, NP  nystatin  cream (MYCOSTATIN ) Apply 1 Application topically 2 (two) times daily. Patient not taking: Reported on 07/14/2023 05/25/23   [name], [name], NP  omeprazole  (PRILOSEC) 40 MG capsule Take 1 capsule (40 mg total) by mouth as needed. 03/22/23   Craig Alan SAUNDERS, PA-C  oxymetazoline  (AFRIN NASAL SPRAY) 0.05 % nasal spray Place 1 spray into both nostrils 2 (two) times daily. 11/30/22   Sherlynn Madden, MD  PILOCARPINE HCL PO Take 2 mLs by mouth.    [provider]  pravastatin  (PRAVACHOL ) 40 MG tablet Take 1 tablet (40 mg total) by mouth daily. 07/12/23   Ladona Heinz, MD  SYNTHROID  100 MCG tablet TAKE 1 TABLET BY MOUTH EVERY DAY BEFORE BREAKFAST 10/25/22   [name], [name], NP  Vitamin D, Ergocalciferol, (DRISDOL) 1.25 MG (50000 UNIT) CAPS capsule TAKE 1 CAPSULE BY MOUTH ONE TIME PER WEEK 03/18/23   [name], [name], NP  Zolpidem  Tartrate 3.5 MG SUBL TAKE 1 TABLET BY MOUTH EVERY DAY AT BEDTIME  AS NEEDED 07/01/23   [name], [name], NP  Zolpidem  Tartrate 3.5 MG SUBL TAKE 1 TABLET BY MOUTH EVERY DAY AT BEDTIME AS NEEDED 07/01/23   [name], [name], NP    Allergies: Lactose intolerance (gi), Norco [hydrocodone-acetaminophen ], Dust mite extract, Oxycontin [oxycodone hcl], and Pollen extract    Review of Systems  Genitourinary:  Positive for frequency.    Updated Vital Signs  Vitals:   07/22/23 1500 07/22/23 1545 07/22/23 1615 07/22/23 1700  BP: (!) 142/61 118/83 125/81 (!) 148/59  Pulse: (!) 59 (!) 58 (!)  57 (!) 52  Resp: (!) 26 11 20 20   Temp: 98.4 F (36.9 C)     TempSrc: Oral     SpO2: 97% 96% 96% 96%  Weight: 71.2 kg     Height: 5' 3 (1.6 m)        Physical Exam Vitals and nursing note reviewed.  HENT:     Head: Normocephalic.  Eyes:     Extraocular Movements: Extraocular movements intact.  Cardiovascular:     Rate and Rhythm: Regular rhythm. Bradycardia present.     Heart sounds: Normal heart sounds.  Pulmonary:     Effort: Pulmonary effort is normal.     Breath sounds: Normal breath sounds.  Abdominal:     Palpations: Abdomen is soft.     Tenderness: There is no abdominal tenderness. There is no guarding.  Musculoskeletal:     Cervical back: Normal range of motion.     Right lower leg: No edema.     Left lower leg: No edema.     Comments: Moves all extremities spontaneously without difficulty  Skin:    General: Skin is warm and dry.  Neurological:     General: No focal deficit present.     Mental Status: She is alert.     (all labs ordered are listed, but only abnormal results are displayed) Labs Reviewed  CBC  BASIC METABOLIC PANEL WITH GFR  URINALYSIS, ROUTINE W REFLEX MICROSCOPIC    EKG: None  Radiology: No results found.   Procedures   Medications Ordered in the ED - No data to display                                  Medical Decision Making This patient presents to the ED for concern of urinary frequency, this involves an extensive number of treatment options, and is a complaint that carries with it a high risk of complications and morbidity.  The differential diagnosis includes overactive bladder, overflow incontinence, stress incontinence, urinary tract infection, pyelonephritis.   Co morbidities that complicate the patient evaluation  History of urinary frequency, hypertension, hyperlipidemia   Additional history obtained:  Additional history obtained from record review External records from outside source obtained and reviewed  including prior PCP note   Lab Tests:  I Ordered, and personally interpreted labs.  The pertinent results include: CBC and BMP are within normal limits, urinalysis within normal limits.   Cardiac Monitoring: / EKG:  The patient was maintained on a cardiac monitor.  I personally viewed and interpreted the cardiac monitored which showed an underlying rhythm of: sinus bradycardia   Problem List / ED Course / Critical interventions / Medication management I have reviewed the patients home medicines and have made adjustments as needed   Social Determinants of Health:  Social isolation   Test / Admission - Considered:  Physical exam is largely  unremarkable, labs are reassuring.  Vitals are notable for borderline bradycardia but otherwise unremarkable.  Patient is seen by urology for her urinary frequency, based on her description it sounds like they are planning a trial of sacral nerve stimulator for resolution of the symptoms, she is not demonstrating any worrisome signs that may be associated with urinary tract infection/pyelonephritis, including fever, back pain/CVA tenderness, dysuria.  I discussed with patient that she should keep her scheduled appointment with urology to further discuss her worsening symptoms.  Patient requested IV fluids for dehydration, advised patient that based on her lab results she is not demonstrating any signs correlating with dehydration, including alterations in her urine specific gravity and other findings.  She is tolerating p.o. without difficulty, I do not feel that she would benefit clinically from IV fluid administration.  She voiced understanding and is in agreement with this plan.  Return precautions discussed, she is appropriate discharge at this time.    Amount and/or Complexity of Data Reviewed Labs: ordered.        Final diagnoses:  Urinary frequency    ED Discharge Orders     None          Tatumn Corbridge N, PA-C 07/22/23 1730     Patsey Lot, MD 07/22/23 2322

## 2023-07-22 NOTE — ED Notes (Addendum)
 [facility] called

## 2023-07-22 NOTE — Discharge Instructions (Addendum)
 Follow-up with your urologist and gastroenterologist as previously scheduled.  Return to the emergency department if your symptoms worsen.

## 2023-07-22 NOTE — ED Notes (Addendum)
 Pt ambulated to desk yelling at staff that she is going to walk out and she is angrier than she has ever been in her life. Pt informed that we were still waiting on the cab to arrive, or that a friend could pick her up (which charge RN had approved). Pt continues yelling at staff and calls a friend who she starts yelling at over the phone. Pt yelling that she has a headache and hasn't had food or water in hours. Pt reminded that she had been offered both multiple times since she has been put up for discharge. Pt given juice.

## 2023-07-22 NOTE — ED Notes (Signed)
 [facility] called again. Advised it would likely be an hour before they could come for transport.

## 2023-07-22 NOTE — ED Notes (Signed)
 Transport arrived for pt. Pt walked to transport with NT.

## 2023-07-29 ENCOUNTER — Encounter: Payer: Self-pay | Admitting: Physician Assistant

## 2023-07-29 ENCOUNTER — Ambulatory Visit: Admitting: Physician Assistant

## 2023-07-29 VITALS — BP 132/82 | HR 72 | Ht 63.0 in | Wt 156.0 lb

## 2023-07-29 DIAGNOSIS — R935 Abnormal findings on diagnostic imaging of other abdominal regions, including retroperitoneum: Secondary | ICD-10-CM | POA: Diagnosis not present

## 2023-07-29 DIAGNOSIS — C4331 Malignant melanoma of nose: Secondary | ICD-10-CM

## 2023-07-29 DIAGNOSIS — Z7902 Long term (current) use of antithrombotics/antiplatelets: Secondary | ICD-10-CM | POA: Diagnosis not present

## 2023-07-29 DIAGNOSIS — Z8673 Personal history of transient ischemic attack (TIA), and cerebral infarction without residual deficits: Secondary | ICD-10-CM

## 2023-07-29 DIAGNOSIS — R1013 Epigastric pain: Secondary | ICD-10-CM | POA: Diagnosis not present

## 2023-07-29 DIAGNOSIS — C434 Malignant melanoma of scalp and neck: Secondary | ICD-10-CM

## 2023-07-29 DIAGNOSIS — Z7901 Long term (current) use of anticoagulants: Secondary | ICD-10-CM

## 2023-07-29 MED ORDER — PANTOPRAZOLE SODIUM 40 MG PO TBEC: 40.0000 mg | DELAYED_RELEASE_TABLET | Freq: Two times a day (BID) | ORAL | 11 refills | Status: DC

## 2023-07-29 NOTE — Patient Instructions (Signed)
 We have sent the following medications to your pharmacy for you to pick up at your convenience: Pantoprazole  40 mg twice daily.  Call if you are not better in two weeks.  _______________________________________________________  If your blood pressure at your visit was 140/90 or greater, please contact your primary care physician to follow up on this.  _______________________________________________________  If you are age 88 or older, your body mass index should be between 23-30. Your Body mass index is 27.63 kg/m. If this is out of the aforementioned range listed, please consider follow up with your Primary Care Provider.  If you are age 2 or younger, your body mass index should be between 19-25. Your Body mass index is 27.63 kg/m. If this is out of the aformentioned range listed, please consider follow up with your Primary Care Provider.   ________________________________________________________  The [facility] providers would like to encourage you to use MYCHART to communicate with providers for non-urgent requests or questions.  Due to long hold times on the telephone, sending your provider a message by [facility] may be a faster and more efficient way to get a response.  Please allow 48 business hours for a response.  Please remember that this is for non-urgent requests.  _______________________________________________________   It was a pleasure to see you today!  Thank you for trusting me with your gastrointestinal care!    Delon Failing, PA-C

## 2023-07-29 NOTE — Progress Notes (Signed)
 Chief Complaint: Epigastric pain  HPI:    Darlene [name] is an 89 year old female with a past medical history as listed below including reflux and stroke on Plavix , known to Dr. Wilhelmenia, who presents to clinic today with an increase in epigastric pain.    4/22 barium swallow showed marked dysmotility.    03/22/23 office visit with Alan coombs, PA-C for dysphagia and cough.  At the time scheduled for an EGD and also recommended/scheduled patient for right upper quadrant ultrasound for gallbladder thickening found on a CT, normal LFTs.    05/11/23 EGD with no gross lesions in the esophagus, dilated up to 18 mm, 4 cm hiatal hernia, Z-line regular, erythematous mucosa in the stomach.  Pathology showed chronic inactive gastritis.  Letter sent discussing a possible change in medication symptoms continued.    Today, the patient presents to clinic for follow-up of her epigastric pain.  Tells me this discomfort is still there, worse after she eats and worse over the past couple of weeks, currently taking her Omeprazole  40 mg twice daily but still does not feel like it helps at all at times.  She has stopped drinking of anything citrus that she was drinking a lot of lemonade.  She never tried adding Pepcid as recommended by her nursing staff previously.  Denies nausea or vomiting.    Denies fever, chills or weight loss.  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   TONSILLECTOMY  1940    Current Outpatient Medications  Medication Sig Dispense Refill   ammonium lactate (AMLACTIN) 12 % cream Apply 1 Application topically as needed.     azelastine  (ASTELIN ) 0.1 % nasal spray Place into both nostrils daily. Use in each nostril as directed     clopidogrel  (PLAVIX ) 75 MG tablet TAKE 1 TABLET BY MOUTH EVERY DAY 90 tablet 1   clotrimazole-betamethasone  (LOTRISONE) cream Apply 1 Application topically 2 (two) times daily.     ibuprofen (ADVIL) 400 MG tablet Take 400 mg by mouth every 6 (six) hours as needed for moderate pain (pain score 4-6).     ketoconazole  (NIZORAL ) 2 % shampoo APPLY TOPICALLY 2 TIMES A WEEK 120 mL 1   losartan  (COZAAR ) 50 MG tablet Take 1 tablet (50 mg total) by mouth daily. 90 tablet 3   nitroGLYCERIN  (NITROSTAT ) 0.4 MG SL tablet Place 1 tablet (0.4 mg total) under the tongue every 5 (five) minutes as needed for up to 25 days for chest pain. 25 tablet 3   omeprazole  (  PRILOSEC) 40 MG capsule Take 1 capsule (40 mg total) by mouth as needed. 90 capsule 3   oxymetazoline  (AFRIN NASAL SPRAY) 0.05 % nasal spray Place 1 spray into both nostrils 2 (two) times daily. 30 mL 0   pravastatin  (PRAVACHOL ) 40 MG tablet Take 1 tablet (40 mg total) by mouth daily. 90 tablet 3   SYNTHROID  100 MCG tablet TAKE 1 TABLET BY MOUTH EVERY DAY BEFORE BREAKFAST 90 tablet 1   Vitamin D, Ergocalciferol, (DRISDOL) 1.25 MG (50000 UNIT) CAPS capsule TAKE 1 CAPSULE BY MOUTH ONE TIME PER WEEK 12 capsule 3   Zolpidem  Tartrate 3.5 MG SUBL TAKE 1 TABLET BY MOUTH EVERY DAY AT BEDTIME AS NEEDED 30 tablet 1   Zolpidem  Tartrate 3.5 MG SUBL TAKE 1 TABLET BY MOUTH EVERY  DAY AT BEDTIME AS NEEDED 30 tablet 1   nystatin  cream (MYCOSTATIN ) Apply 1 Application topically 2 (two) times daily. (Patient not taking: Reported on 07/29/2023) 30 g 3   No current facility-administered medications for this visit.    Allergies as of 07/29/2023 - Review Complete 07/29/2023  Allergen Reaction Noted   Lactose intolerance (gi) Other (See Comments) 06/06/2021   Norco [hydrocodone-acetaminophen ] Nausea And Vomiting 05/17/2019   Dust mite extract Other (See Comments) 09/06/2017   Oxycontin [oxycodone hcl] Nausea And Vomiting 09/06/2017   Pollen extract Other (See Comments) 09/06/2017    Family History  Problem Relation Age of Onset   Lung cancer Mother 61       smoker   Heart disease Mother    Stroke Father 32   Heart attack Father    Allergic rhinitis Sister    Hypertension Sister    CVA Brother    Lung cancer Maternal Grandfather    Heart attack Paternal Grandfather    Melanoma Daughter    Psoriasis Daughter    Rheum arthritis Daughter    Bipolar disorder Daughter    Colon cancer Neg Hx    Esophageal cancer Neg Hx    Inflammatory bowel disease Neg Hx    Liver disease Neg Hx    Pancreatic cancer Neg Hx    Stomach cancer Neg Hx    Rectal cancer Neg Hx     Social History   Socioeconomic History   Marital status: Widowed    Spouse name: Not on file   Number of children: 3   Years of education: Not on file   Highest education level: Not on file  Occupational History   Occupation: retired Chemical engineer  Tobacco Use   Smoking status: Never   Smokeless tobacco: Never  Vaping Use   Vaping status: Never Used  Substance and Sexual Activity   Alcohol use: Not Currently    Comment: hardly ever   Drug use: Never   Sexual activity: Not Currently  Other Topics Concern   Not on file  Social History Narrative   Social History      Diet? ok      Do you drink/eat things with caffeine? Weak coffee, (much milk)      Marital status?           divorced                          What year were you married? 1961      Do you live in a house, apartment, assisted living, condo, trailer, etc.? apartment      Is it one or more stories? 4  How many persons live in your home? Only me      Do you have any pets in your home? (please list) no      Highest level of education completed? 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             yes                         Type & how often? Walk- would love to return to water exercise      Advanced Directives      Do you have a living will? yes      Do you have a DNR form?           no                       If not, do you want to discuss one? no      Do you have signed POA/HPOA for forms? no      Functional Status      Do you have difficulty bathing or dressing yourself?  no      Do you have difficulty preparing food or eating? no      Do you have difficulty managing your medications? no      Do you have difficulty managing your finances?  no      Do you have difficulty affording your medications? no      Social Drivers of Corporate investment banker Strain: Not on file  Food Insecurity: No Food Insecurity (04/19/2023)   Hunger Vital Sign    Worried About Running Out of Food in the Last Year: Never true    Ran Out of Food in the Last Year: Never true  Transportation Needs: No Transportation Needs (04/19/2023)   PRAPARE - Administrator, Civil Service (Medical): No    Lack of Transportation (Non-Medical): No  Physical Activity: Not on file  Stress: Not on file  Social Connections: Moderately Isolated (04/19/2023)   Social Connection and Isolation Panel    Frequency of Communication with Friends and Family: More than three times a week    Frequency of Social Gatherings with Friends and Family: More than three times a week    Attends Religious Services: Never    Database administrator or  Organizations: Yes    Attends Engineer, structural: More than 4 times per year    Marital Status: Widowed  Intimate Partner Violence: Not At Risk (04/19/2023)   Humiliation, Afraid, Rape, and Kick questionnaire    Fear of Current or Ex-Partner: No    Emotionally Abused: No    Physically Abused: No    Sexually Abused: No    Review of Systems:    Constitutional: No weight loss, fever or chills Cardiovascular: No chest pain   Respiratory: No SOB  Gastrointestinal: See HPI and otherwise negative   Physical Exam:  Vital signs: BP 132/82   Pulse 72   Ht 5' 3 (1.6 m)   Wt 156 lb (70.8 kg)   BMI 27.63 kg/m   Constitutional:   Pleasant elderly Caucasian female appears to be in NAD, Well developed, Well nourished, alert and cooperative Respiratory: Respirations even and unlabored. Lungs clear to auscultation bilaterally.   No wheezes, crackles, or rhonchi.  Cardiovascular: Normal S1, S2. No  MRG. Regular rate and rhythm. No peripheral edema, cyanosis or pallor.  Gastrointestinal:  Soft, nondistended, mild -moderate epigastric ttp, No rebound or guarding. Normal bowel sounds. No appreciable masses or hepatomegaly. Rectal:  Not performed.  Psychiatric: Demonstrates good judgement and reason without abnormal affect or behaviors.  RELEVANT LABS AND IMAGING: CBC    Component Value Date/Time   WBC 7.5 07/22/2023 1605   RBC 4.42 07/22/2023 1605   HGB 13.1 07/22/2023 1605   HCT 40.2 07/22/2023 1605   PLT 214 07/22/2023 1605   MCV 91.0 07/22/2023 1605   MCH 29.6 07/22/2023 1605   MCHC 32.6 07/22/2023 1605   RDW 12.7 07/22/2023 1605   LYMPHSABS 1.9 05/06/2023 1114   MONOABS 0.4 05/06/2023 1114   EOSABS 0.2 05/06/2023 1114   BASOSABS 0.1 05/06/2023 1114    CMP     Component Value Date/Time   NA 143 07/22/2023 1605   NA 144 05/12/2021 1153   K 4.1 07/22/2023 1605   CL 111 07/22/2023 1605   CL 105 04/15/2017 0000   CO2 25 07/22/2023 1605   GLUCOSE 96 07/22/2023 1605   BUN  20 07/22/2023 1605   BUN 14 05/12/2021 1153   CREATININE 0.76 07/22/2023 1605   CREATININE 0.75 04/19/2023 1546   CALCIUM 9.4 07/22/2023 1605   CALCIUM 10.1 04/15/2017 0000   PROT 6.3 (L) 05/06/2023 1114   ALBUMIN 3.6 05/06/2023 1114   AST 16 05/06/2023 1114   ALT 14 05/06/2023 1114   ALKPHOS 60 05/06/2023 1114   BILITOT 0.7 05/06/2023 1114   GFRNONAA >60 07/22/2023 1605   GFRNONAA 68 12/26/2019 1053   GFRAA 78 12/26/2019 1053    Assessment: 1.  Epigastric pain: Recent EGD with chronic inactive gastritis, patient now with increasing epigastric discomfort, worse after eating regardless of Meprazole 40 mg twice daily; consider active gastritis +/- gallbladder as below 2.  Gallbladder thickening: On CT chest abdomen and pelvis 02/22/2023 normal LFTs, right upper quadrant ultrasound ordered at last visit but not completed yet 3.  History of CVA: On Plavix  4.  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  At this point we will make sure that patient has ruled for the right upper quadrant ultrasound as ordered by [facility].  Certainly if her gallbladder is inflamed it could be adding to her discomfort. 2.  For now we will switch Omeprazole  to Pantoprazole  40 mg twice daily, 30-60 minutes for breakfast and dinner #60 with 11 refills. 3.  Told the patient to contact us  in a couple weeks if the medication switch is not helping.  At that point could trial Pepcid 40 mg nightly as well.  Hopefully will get her right upper quadrant ultrasound done as well. 4.  Patient to follow in clinic with me in 2 to 3 months or sooner if necessary.  Delon Failing, PA-C [facility] 07/29/2023, 10:22 AM  Cc: [name], [name], NP

## 2023-08-01 ENCOUNTER — Other Ambulatory Visit: Payer: Self-pay | Admitting: *Deleted

## 2023-08-01 ENCOUNTER — Telehealth: Payer: Self-pay | Admitting: Physician Assistant

## 2023-08-01 MED ORDER — OMEPRAZOLE 40 MG PO CPDR
40.0000 mg | DELAYED_RELEASE_CAPSULE | Freq: Two times a day (BID) | ORAL | 3 refills | Status: DC
Start: 2023-08-01 — End: 2023-09-01

## 2023-08-01 NOTE — Progress Notes (Signed)
 Attending Physician's Attestation   I have reviewed the chart.   I agree with the Advanced Practitioner's note, impression, and recommendations with any updates as below. I think right upper quadrant ultrasound imaging is okay to move forward with pursuing and completing.  If patient's discomfort was feeling postprandial as described, it may make sense to go ahead and consider referral to surgery for consideration of cholecystectomy.  She has known cholelithiasis based on outside 6/25 CT scan.  The previous thickening that was reported from February is no longer present of the gallbladder.  If she is amenable to moving forward with referral, then please place referral to Dr. Dasie or Dr. Aron.  If she wants to be seen at [facility], please place referral there.   Aloha Finner, MD [facility]oscopy Office # [phone]

## 2023-08-01 NOTE — Progress Notes (Signed)
 Spoke with patient and for now she would like to hold off on a surgical consult. She has an upcoming surgery already schedule and would like to switch back to Omeprazole  and try that again. Script sent for Omeprazole  to patient pharmacy. She will update us  in a few weeks if its not helping.

## 2023-08-01 NOTE — Telephone Encounter (Signed)
 I spoke with the pt and discussed her medication pantoprazole .  She is scared of the side effects.  I did also discuss the recommendations per Dr Wilhelmenia from Delon Cookey last office note.  She does not wish to proceed with surgical referral at this time.  She will call back if she changes her mind.  She is going to continue pantoprazole  for now.

## 2023-08-01 NOTE — Telephone Encounter (Signed)
 Inbound call from patient stating she no longer wishes to take pantoprazole . States she is unable to handle side effects. Requesting a call to discuss other alternatives. Please advise, thank you

## 2023-08-03 NOTE — Telephone Encounter (Signed)
 Inbound call from patient stating she has additional questions regarding medication questions she has that she would like to discuss with Patty further. Please advise, thank you

## 2023-08-03 NOTE — Telephone Encounter (Signed)
 The pt wanted to confirm that she is taking omeprazole  now and will call back with an update in a few weeks.

## 2023-08-04 ENCOUNTER — Encounter: Payer: Self-pay | Admitting: Nurse Practitioner

## 2023-08-04 ENCOUNTER — Ambulatory Visit: Admitting: Nurse Practitioner

## 2023-08-04 VITALS — BP 138/84 | HR 71 | Temp 97.5°F | Ht 63.0 in | Wt 156.0 lb

## 2023-08-04 DIAGNOSIS — R32 Unspecified urinary incontinence: Secondary | ICD-10-CM

## 2023-08-04 DIAGNOSIS — K219 Gastro-esophageal reflux disease without esophagitis: Secondary | ICD-10-CM

## 2023-08-04 DIAGNOSIS — E039 Hypothyroidism, unspecified: Secondary | ICD-10-CM

## 2023-08-04 DIAGNOSIS — I10 Essential (primary) hypertension: Secondary | ICD-10-CM

## 2023-08-04 NOTE — Assessment & Plan Note (Signed)
 stable Omeprazole , FDgard helped,  saw GI, EGD 12/26/19 and 05/11/23(dilated, no gross lesions)Vit B12 383 12/02/20, Hgb 13.1 07/22/23

## 2023-08-04 NOTE — Assessment & Plan Note (Signed)
 on Levothyroxine-declined dose adjustment.  TSH 0.168 02/18/22, 0.77 03/14/23

## 2023-08-04 NOTE — Assessment & Plan Note (Signed)
 blood pressure is controlled on Losartan  qd. added Imdur  30mg  tid by Cardiology. Followed by Cardiology. CTA negative 09/01/20. Bun/creat 20/0.76 07/22/23

## 2023-08-04 NOTE — Progress Notes (Signed)
 Location:   clinic [facility]   Place of Service:    Provider: Larwance Hark NP  Jedrek Dinovo X, NP  Patient Care Team: Edgar Corrigan X, NP as PCP - General (Internal Medicine) Ladona Heinz, MD as PCP - Cardiology (Cardiology) Clark-Burning, Delon, PA-C (Inactive) (Dermatology) Ladona Heinz, MD as Consulting Physician (Cardiology) Mansouraty, Aloha Raddle., MD as Consulting Physician (Gastroenterology) Ethyl Lonni BRAVO, MD (Inactive) as Consulting Physician (Otolaryngology) Porter Andrez SAUNDERS, PA-C as Physician Assistant (Dermatology) Plonk, Bard Gander, MD as Referring Physician (Otolaryngology)  Extended Emergency Contact Information Primary Emergency Contact: Okey Ronal Pulling Home Phone: [phone] Relation: Friend Secondary Emergency Contact: Sidle,Ted Mobile Phone: [phone] Relation: Son  Code Status:  DNR Goals of care: Advanced Directive information    07/22/2023    3:03 PM  Advanced Directives  Does Patient Have a Medical Advance Directive? Yes  Type of Estate agent of [place];Living will     Chief Complaint  Patient presents with   Medication Management    Review and discuss medications     HPI:  Pt is a 89 y.o. female seen today for medical management of chronic diseases.    Dry mouth, didn't tolerate Cevimeline, cause frequent BM, night sweat, fatigue, wants to eliminate Ambien  to avoid day time sleepiness. Now special mouth wash works well.               Resolved pain allover after immunotherapy infusion,, cancer center recommended Tylenol  up to 3000mg  a day for pain.                          After 2nd Shingrix, feels aches, headache, more nasal/ear congestion.   05/06/23 ED eval SOB, attributed to GERD-discharged with return precaution,  followed by GI, EGD 05/11/23, Dr. Lesly, no gross lesions, dilated, biopsied.  07/21/22 ED eval, dehydration, gum swelling             Malignant melanoma of nasal cavity, followed by oncology, ENT,  dental team, remission.              Anxiety, chronic              Sleeps too much during day, failed dc Ambien  in the past,  off Valium , refused CPAP, feels a veil in front of left eye, more noticeable left facial weakness Hx of chronic rhinitis/sphenoidal sinusitis, s/p surgery, improved SOB, wheezing, but still has nasal/right ear congestion, on Astelin , Flonase , Bronchodilator, underwent Pulmonology, ENT, GI, Allergy  evaluation, had spirometry 10/20/20, Echo 60-65% EF 04/16/20, CTA 09/01/20. Saw ENT             Chronic sore throat: multiple factorials, GERD vs chronic rhinitis/sinusitis,  ENT  Insomnia trial of dc Ambien              Hx of CVA, on plavix . Statin,  residual of  poor left peripheral vision  Hypothyroidism, on Levothyroxine -declined dose adjustment.  TSH 0.168 02/18/22, 0.77 03/14/23             HTN, blood pressure is controlled on Losartan  qd. added Imdur  30mg  tid by Cardiology. Followed by Cardiology. CTA negative 09/01/20. Bun/creat 20/0.76 07/22/23            GERD, stable Omeprazole , FDgard helped,  saw GI, EGD 12/26/19 and 05/11/23(dilated, no gross lesions)Vit B12 383 12/02/20, Hgb 13.1 07/22/23             Lactose intolerance, diet adjustment, f/u GI  Esophageal dysmotility, Swallow studay 05/12/20, worked with ST, GI f/u             OSA sleep study 07/29/23 OSA, periodic limb movement during sleep,  Not using CPAP                                     OA, saw Ortho. Cervical spinal stenosis, MR 02/26/20, C6-C7. Delaying  R mensicus tender surgery, Tramadol, Advil.              Hyperlipidemia, takes Pravastatin , LDL 94 05/12/21             Urinary frequency/leakage, 2-3x/night, stopped taking Gemtesa , acupuncture improved initially. Saw urology. treated with Augmentin  04/19/23, urine culture showed no growth 04/19/23, persisted with symptoms. Pending Medtronic bladder stimulator implant.              Osteopenia, DEXA 12/25/20, t score -1.0,  10 years possibility major fx 11%, hip fx 2.6%.  Vit D, Cal-diet rich,  Vit D 3 39 12/26/20             HOH audiology eval, ENT placed tube in the right ear-failed-again, hearing aids  Past Medical History:  Diagnosis Date   Anxiety    Atypical mole 11/11/2020   Left Inner Knee (moderate-free)   Colon polyps    Depression    Hyperlipidemia    Hypertension    Hypothyroidism    Melanoma in situ (HCC) 09/19/2018   Right Arm (excision)   Skin cancer    Sleep apnea    Stroke [facility]) 2008   Past Surgical History:  Procedure Laterality Date   ADENOIDECTOMY     APPENDECTOMY  1960   CATARACT EXTRACTION Bilateral    COLONOSCOPY     ESOPHAGOGASTRODUODENOSCOPY N/A 05/11/2023   Procedure: EGD (ESOPHAGOGASTRODUODENOSCOPY);  Surgeon: Wilhelmenia Aloha Raddle., MD;  Location: [facility];  Service: Gastroenterology;  Laterality: N/A;   GUM SURGERY  03/2019   LUMBAR DISC SURGERY  2014   MELANOMA EXCISION  2020   MESH APPLIED TO LAP PORT     SINUS EXPLORATION     11/17/2021 operation for right nasal cavity and 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   TONSILLECTOMY  1940    Allergies  Allergen Reactions   Lactose Intolerance (Gi) Other (See Comments)    indigestion   Norco [Hydrocodone-Acetaminophen ] Nausea And Vomiting   Dust Mite Extract Other (See Comments)    Sinus drainage   Oxycontin [Oxycodone Hcl] Nausea And Vomiting   Pollen Extract Other (See Comments)    Sinus drainage    Allergies as of 08/04/2023       Reactions   Lactose Intolerance (gi) Other (See Comments)   indigestion   Norco [hydrocodone-acetaminophen ] Nausea And Vomiting   Dust Mite Extract Other (See Comments)   Sinus drainage   Oxycontin [oxycodone Hcl] Nausea And Vomiting   Pollen Extract Other (See Comments)   Sinus drainage        Medication List        Accurate as of August 04, 2023  3:34 PM. If you have any questions, ask your nurse or doctor.          ammonium lactate 12 % cream Commonly known as: AMLACTIN Apply 1  Application topically as needed.   azelastine  0.1 % nasal spray Commonly known as: ASTELIN  Place into both nostrils daily. Use in each nostril as directed  clopidogrel  75 MG tablet Commonly known as: PLAVIX  TAKE 1 TABLET BY MOUTH EVERY DAY   clotrimazole-betamethasone  cream Commonly known as: LOTRISONE Apply 1 Application topically 2 (two) times daily.   ibuprofen 400 MG tablet Commonly known as: ADVIL Take 400 mg by mouth every 6 (six) hours as needed for moderate pain (pain score 4-6).   ketoconazole  2 % shampoo Commonly known as: NIZORAL  APPLY TOPICALLY 2 TIMES A WEEK   losartan  50 MG tablet Commonly known as: COZAAR  Take 1 tablet (50 mg total) by mouth daily.   nitroGLYCERIN  0.4 MG SL tablet Commonly known as: NITROSTAT  Place 1 tablet (0.4 mg total) under the tongue every 5 (five) minutes as needed for up to 25 days for chest pain.   nystatin  cream Commonly known as: MYCOSTATIN  Apply 1 Application topically 2 (two) times daily.   omeprazole  40 MG capsule Commonly known as: PRILOSEC Take 1 capsule (40 mg total) by mouth 2 (two) times daily before a meal.   oxymetazoline  0.05 % nasal spray Commonly known as: Afrin Nasal Spray Place 1 spray into both nostrils 2 (two) times daily.   pravastatin  40 MG tablet Commonly known as: PRAVACHOL  Take 1 tablet (40 mg total) by mouth daily.   Synthroid  100 MCG tablet Generic drug: levothyroxine  TAKE 1 TABLET BY MOUTH EVERY DAY BEFORE BREAKFAST   UNABLE TO FIND Med Name: Pilocarpine hcl (mucolox) 4mg /ml, use 2-4 mls to swish and gargle for 1-2 minutes then spit out   Vitamin D (Ergocalciferol) 1.25 MG (50000 UNIT) Caps capsule Commonly known as: DRISDOL TAKE 1 CAPSULE BY MOUTH ONE TIME PER WEEK   Zolpidem  Tartrate 3.5 MG Subl TAKE 1 TABLET BY MOUTH EVERY DAY AT BEDTIME AS NEEDED   Zolpidem  Tartrate 3.5 MG Subl TAKE 1 TABLET BY MOUTH EVERY DAY AT BEDTIME AS NEEDED        Review of Systems  Constitutional:   Positive for fatigue. Negative for appetite change and fever.  HENT:  Positive for hearing loss. Negative for congestion and voice change.        Chronic sinusitis, underwent ENT, sleep study-sleep apnea.  Eyes:  Positive for visual disturbance.       Left lateral visual field deficit. Dry eyes  Respiratory:  Negative for cough and shortness of breath.   Cardiovascular:  Negative for leg swelling.  Gastrointestinal:  Negative for abdominal pain and constipation.       GERD wants to continue Omeprazole  30 min ac breakfast and 4hr after Levothyroxine .   Genitourinary:  Positive for frequency. Negative for hematuria and urgency.       Urinary leakage, urinary frequency 2-3x/night. Doing Kegel exercise. Followed by Urology  Musculoskeletal:  Positive for arthralgias. Negative for back pain and gait problem.       R knee needs replacement.   Skin:  Negative for color change.       Mohs dorsum right hand, healed.   Neurological:  Negative for speech difficulty, weakness and headaches.  Psychiatric/Behavioral:  Positive for sleep disturbance. Negative for dysphoric mood. The patient is not nervous/anxious.        Early am awake, difficulty returning asleep.     Immunization History  Administered Date(s) Administered   Hepatitis A 04/04/2000   Influenza-Unspecified 10/18/2014   Moderna Sars-Covid-2 Vaccination 01/22/2019, 02/19/2019, 06/05/2020   Pneumococcal Conjugate-13 12/20/2018   Pneumococcal Polysaccharide-23 01/18/2005   Tdap 11/25/2008   Zoster Recombinant(Shingrix) 12/20/2018, 01/20/2022, 09/15/2022   Pertinent  Health Maintenance Due  Topic Date Due  Colonoscopy  10/25/2021   INFLUENZA VACCINE  08/19/2023   DEXA SCAN  Completed      11/30/2022   11:22 AM 12/09/2022    3:27 PM 12/30/2022   12:59 PM 04/19/2023    3:14 PM 05/19/2023    1:44 PM  Fall Risk  Falls in the past year? 0 0 0 0 0  Was there an injury with Fall? 0 0 0 0 0  Fall Risk Category Calculator 0 0 0 0 0   Patient at Risk for Falls Due to No Fall Risks   No Fall Risks No Fall Risks  Fall risk Follow up Falls evaluation completed;Education provided;Falls prevention discussed   Falls evaluation completed Falls evaluation completed   Functional Status Survey:    Vitals:   08/04/23 0936  BP: 138/84  Pulse: 71  Temp: (!) 97.5 F (36.4 C)  SpO2: 97%  Weight: 156 lb (70.8 kg)  Height: 5' 3 (1.6 m)   Body mass index is 27.63 kg/m. Physical Exam Vitals and nursing note reviewed.  Constitutional:      Appearance: Normal appearance.  HENT:     Head: Normocephalic and atraumatic.     Ears:     Comments: Right ear tube placed, mild erythema, no bleeding.     Nose: Nose normal.     Mouth/Throat:     Mouth: Mucous membranes are moist.  Eyes:     Extraocular Movements: Extraocular movements intact.     Conjunctiva/sclera: Conjunctivae normal.     Pupils: Pupils are equal, round, and reactive to light.     Comments: Left peripheral visual field deficit since CVA 2006  Cardiovascular:     Rate and Rhythm: Normal rate and regular rhythm.     Heart sounds: No murmur heard. Pulmonary:     Effort: Pulmonary effort is normal.     Breath sounds: No rales.  Abdominal:     General: Bowel sounds are normal.     Palpations: Abdomen is soft.     Tenderness: There is no abdominal tenderness.  Musculoskeletal:        General: Tenderness present.     Cervical back: Normal range of motion and neck supple.     Right lower leg: No edema.     Left lower leg: No edema.     Comments: R knee pain when standing, no s/s of infection or injury.   Skin:    General: Skin is warm and dry.  Neurological:     General: No focal deficit present.     Mental Status: She is alert and oriented to person, place, and time. Mental status is at baseline.     Motor: Weakness present.     Gait: Gait normal.     Comments: Left facial weakness  Psychiatric:        Mood and Affect: Mood normal.        Behavior:  Behavior normal.        Thought Content: Thought content normal.        Judgment: Judgment normal.     Labs reviewed: Recent Labs    04/19/23 1546 05/06/23 1114 07/22/23 1605  NA 142 142 143  K 4.3 3.9 4.1  CL 106 111 111  CO2 26 23 25   GLUCOSE 87 116* 96  BUN 17 15 20   CREATININE 0.75 0.45 0.76  CALCIUM 10.0 9.5 9.4   Recent Labs    05/06/23 1114  AST 16  ALT 14  ALKPHOS 60  BILITOT 0.7  PROT 6.3*  ALBUMIN 3.6   Recent Labs    04/19/23 1546 05/06/23 1114 07/22/23 1605  WBC 7.0 5.6 7.5  NEUTROABS  --  3.0  --   HGB 14.7 13.8 13.1  HCT 43.6 42.9 40.2  MCV 88.1 92.1 91.0  PLT 275 231 214   Lab Results  Component Value Date   TSH 0.77 03/14/2023   Lab Results  Component Value Date   HGBA1C 6.1 (H) 06/07/2021   Lab Results  Component Value Date   CHOL 163 05/12/2021   HDL 44 05/12/2021   LDLCALC 94 05/12/2021   TRIG 144 05/12/2021   CHOLHDL 3.5 12/26/2019    Significant Diagnostic Results in last 30 days:  No results found.  Assessment/Plan  Essential hypertension blood pressure is controlled on Losartan  qd. added Imdur  30mg  tid by Cardiology. Followed by Cardiology. CTA negative 09/01/20. Bun/creat 20/0.76 07/22/23  Gastroesophageal reflux disease stable Omeprazole , FDgard helped,  saw GI, EGD 12/26/19 and 05/11/23(dilated, no gross lesions)Vit B12 383 12/02/20, Hgb 13.1 07/22/23  Hypothyroidism  on Levothyroxine -declined dose adjustment.  TSH 0.168 02/18/22, 0.77 03/14/23  Incontinent of urine  Urinary frequency/leakage, 2-3x/night, stopped taking Gemtesa , acupuncture improved initially. Saw urology. treated with Augmentin  04/19/23, urine culture showed no growth 04/19/23, persisted with symptoms  Pending Medtronic bladder stimulator implant.    Family/ staff Communication: plan of care reviewed with the patient   Labs/tests ordered:  none

## 2023-08-04 NOTE — Assessment & Plan Note (Addendum)
 Urinary frequency/leakage, 2-3x/night, stopped taking Gemtesa , acupuncture improved initially. Saw urology. treated with Augmentin  04/19/23, urine culture showed no growth 04/19/23, persisted with symptoms  Pending Medtronic bladder stimulator implant.

## 2023-08-09 NOTE — Telephone Encounter (Signed)
 Inbound call from patient, states she would like Darlene [name] to call her today, in regards to messages below and also in regards to surgery for incontinence and other things they discussed prior.

## 2023-08-10 ENCOUNTER — Encounter: Payer: Self-pay | Admitting: Cardiology

## 2023-08-10 ENCOUNTER — Other Ambulatory Visit: Payer: Self-pay

## 2023-08-10 ENCOUNTER — Observation Stay (HOSPITAL_COMMUNITY)
Admission: EM | Admit: 2023-08-10 | Discharge: 2023-08-12 | Disposition: A | Discharge status: Home / Self Care | Attending: Family Medicine | Admitting: Family Medicine

## 2023-08-10 ENCOUNTER — Telehealth: Payer: Self-pay | Admitting: Cardiology

## 2023-08-10 ENCOUNTER — Encounter (HOSPITAL_COMMUNITY): Payer: Self-pay | Admitting: Internal Medicine

## 2023-08-10 ENCOUNTER — Emergency Department (HOSPITAL_COMMUNITY)

## 2023-08-10 DIAGNOSIS — R0989 Other specified symptoms and signs involving the circulatory and respiratory systems: Secondary | ICD-10-CM | POA: Diagnosis not present

## 2023-08-10 DIAGNOSIS — N3941 Urge incontinence: Secondary | ICD-10-CM | POA: Diagnosis not present

## 2023-08-10 DIAGNOSIS — I209 Angina pectoris, unspecified: Secondary | ICD-10-CM | POA: Diagnosis not present

## 2023-08-10 DIAGNOSIS — E876 Hypokalemia: Secondary | ICD-10-CM | POA: Insufficient documentation

## 2023-08-10 DIAGNOSIS — E872 Acidosis, unspecified: Secondary | ICD-10-CM | POA: Insufficient documentation

## 2023-08-10 DIAGNOSIS — Z01810 Encounter for preprocedural cardiovascular examination: Secondary | ICD-10-CM

## 2023-08-10 DIAGNOSIS — Z7901 Long term (current) use of anticoagulants: Secondary | ICD-10-CM | POA: Diagnosis not present

## 2023-08-10 DIAGNOSIS — I10 Essential (primary) hypertension: Secondary | ICD-10-CM | POA: Diagnosis not present

## 2023-08-10 DIAGNOSIS — R002 Palpitations: Secondary | ICD-10-CM | POA: Diagnosis present

## 2023-08-10 DIAGNOSIS — E878 Other disorders of electrolyte and fluid balance, not elsewhere classified: Secondary | ICD-10-CM | POA: Diagnosis not present

## 2023-08-10 DIAGNOSIS — Z79899 Other long term (current) drug therapy: Secondary | ICD-10-CM | POA: Insufficient documentation

## 2023-08-10 DIAGNOSIS — Z85828 Personal history of other malignant neoplasm of skin: Secondary | ICD-10-CM | POA: Diagnosis not present

## 2023-08-10 DIAGNOSIS — E039 Hypothyroidism, unspecified: Secondary | ICD-10-CM | POA: Diagnosis not present

## 2023-08-10 LAB — TROPONIN I (HIGH SENSITIVITY): Troponin I (High Sensitivity): <2 ng/L (ref <18)

## 2023-08-10 LAB — CBC WITH DIFFERENTIAL/PLATELET
Abs Immature Granulocytes: 0.01 K/uL (ref 0.00–0.07)
Basophils Absolute: 0 K/uL (ref 0.0–0.1)
Basophils Relative: 1 %
Eosinophils Absolute: 0.1 K/uL (ref 0.0–0.5)
Eosinophils Relative: 2 %
HCT: 41.3 % (ref 36.0–46.0)
Hemoglobin: 13.5 g/dL (ref 12.0–15.0)
Immature Granulocytes: 0 %
Lymphocytes Relative: 28 %
Lymphs Abs: 1.6 K/uL (ref 0.7–4.0)
MCH: 29.8 pg (ref 26.0–34.0)
MCHC: 32.7 g/dL (ref 30.0–36.0)
MCV: 91.2 fL (ref 80.0–100.0)
Monocytes Absolute: 0.4 K/uL (ref 0.1–1.0)
Monocytes Relative: 7 %
Neutro Abs: 3.6 K/uL (ref 1.7–7.7)
Neutrophils Relative %: 62 %
Platelets: 218 K/uL (ref 150–400)
RBC: 4.53 MIL/uL (ref 3.87–5.11)
RDW: 12.7 % (ref 11.5–15.5)
WBC: 5.8 K/uL (ref 4.0–10.5)
nRBC: 0 % (ref 0.0–0.2)

## 2023-08-10 LAB — COMPREHENSIVE METABOLIC PANEL WITH GFR
ALT: 12 U/L (ref 0–44)
AST: 13 U/L — ABNORMAL LOW (ref 15–41)
Albumin: 2.4 g/dL — ABNORMAL LOW (ref 3.5–5.0)
Alkaline Phosphatase: 39 U/L (ref 38–126)
Anion gap: 7 (ref 5–15)
BUN: 9 mg/dL (ref 8–23)
CO2: 17 mmol/L — ABNORMAL LOW (ref 22–32)
Calcium: 6.5 mg/dL — ABNORMAL LOW (ref 8.9–10.3)
Chloride: 122 mmol/L — ABNORMAL HIGH (ref 98–111)
Creatinine, Ser: 0.47 mg/dL (ref 0.44–1.00)
GFR, Estimated: >60 mL/min (ref >60)
Glucose, Bld: 84 mg/dL (ref 70–99)
Potassium: 2.7 mmol/L — CL (ref 3.5–5.1)
Sodium: 146 mmol/L — ABNORMAL HIGH (ref 135–145)
Total Bilirubin: 0.6 mg/dL (ref 0.0–1.2)
Total Protein: 4.2 g/dL — ABNORMAL LOW (ref 6.5–8.1)

## 2023-08-10 LAB — TSH: TSH: 0.15 u[IU]/mL — ABNORMAL LOW (ref 0.350–4.500)

## 2023-08-10 LAB — URINALYSIS, ROUTINE W REFLEX MICROSCOPIC
Bilirubin Urine: NEGATIVE
Glucose, UA: NEGATIVE mg/dL
Hgb urine dipstick: NEGATIVE
Ketones, ur: NEGATIVE mg/dL
Leukocytes,Ua: NEGATIVE
Nitrite: NEGATIVE
Protein, ur: NEGATIVE mg/dL
Specific Gravity, Urine: 1.014 (ref 1.005–1.030)
pH: 7 (ref 5.0–8.0)

## 2023-08-10 LAB — T4, FREE: Free T4: 1.12 ng/dL (ref 0.61–1.12)

## 2023-08-10 LAB — ETHANOL: Alcohol, Ethyl (B): <15 mg/dL (ref <15)

## 2023-08-10 LAB — HEMOGLOBIN A1C
Hgb A1c MFr Bld: 5.8 % — ABNORMAL HIGH (ref 4.8–5.6)
Mean Plasma Glucose: 119.76 mg/dL

## 2023-08-10 LAB — I-STAT CG4 LACTIC ACID, ED: Lactic Acid, Venous: 1.4 mmol/L (ref 0.5–1.9)

## 2023-08-10 LAB — MAGNESIUM: Magnesium: 1.5 mg/dL — ABNORMAL LOW (ref 1.7–2.4)

## 2023-08-10 LAB — D-DIMER, QUANTITATIVE: D-Dimer, Quant: 0.4 ug{FEU}/mL (ref 0.00–0.50)

## 2023-08-10 MED ORDER — LEVOTHYROXINE SODIUM 100 MCG PO TABS
100.0000 ug | ORAL_TABLET | Freq: Every day | ORAL | Status: DC
  Administered 2023-08-11: 100 ug via ORAL
  Filled 2023-08-10: qty 1

## 2023-08-10 MED ORDER — ZOLPIDEM TARTRATE 5 MG PO TABS
5.0000 mg | ORAL_TABLET | Freq: Every evening | ORAL | Status: AC | PRN
  Administered 2023-08-10: 5 mg via ORAL
  Filled 2023-08-10: qty 1

## 2023-08-10 MED ORDER — SODIUM CHLORIDE 0.9 % IV BOLUS
1000.0000 mL | Freq: Once | INTRAVENOUS | Status: AC
  Administered 2023-08-10: 1000 mL via INTRAVENOUS

## 2023-08-10 MED ORDER — SALINE SPRAY 0.65 % NA SOLN
1.0000 | NASAL | Status: DC | PRN
  Administered 2023-08-11: 1 via NASAL
  Filled 2023-08-10: qty 44

## 2023-08-10 MED ORDER — PRAVASTATIN SODIUM 40 MG PO TABS
40.0000 mg | ORAL_TABLET | Freq: Every day | ORAL | Status: DC
  Administered 2023-08-10 – 2023-08-12 (×3): 40 mg via ORAL
  Filled 2023-08-10 (×3): qty 1

## 2023-08-10 MED ORDER — POTASSIUM CHLORIDE 10 MEQ/100ML IV SOLN: 10.0000 meq | Freq: Once | INTRAVENOUS | Status: DC

## 2023-08-10 MED ORDER — ALBUTEROL SULFATE (2.5 MG/3ML) 0.083% IN NEBU: 2.5000 mg | INHALATION_SOLUTION | Freq: Four times a day (QID) | RESPIRATORY_TRACT | Status: DC | PRN

## 2023-08-10 MED ORDER — CLOPIDOGREL BISULFATE 75 MG PO TABS
75.0000 mg | ORAL_TABLET | Freq: Every day | ORAL | Status: DC
  Administered 2023-08-10 – 2023-08-12 (×3): 75 mg via ORAL
  Filled 2023-08-10 (×3): qty 1

## 2023-08-10 MED ORDER — CALCIUM GLUCONATE-NACL 1-0.675 GM/50ML-% IV SOLN
1.0000 g | Freq: Once | INTRAVENOUS | Status: AC
  Administered 2023-08-10: 1000 mg via INTRAVENOUS
  Filled 2023-08-10: qty 50

## 2023-08-10 MED ORDER — HEPARIN SODIUM (PORCINE) 5000 UNIT/ML IJ SOLN
5000.0000 [IU] | Freq: Two times a day (BID) | INTRAMUSCULAR | Status: DC
  Administered 2023-08-10 – 2023-08-12 (×5): 5000 [IU] via SUBCUTANEOUS
  Filled 2023-08-10 (×5): qty 1

## 2023-08-10 MED ORDER — FESOTERODINE FUMARATE ER 4 MG PO TB24
4.0000 mg | ORAL_TABLET | Freq: Every day | ORAL | Status: DC
  Administered 2023-08-10 – 2023-08-11 (×2): 4 mg via ORAL
  Filled 2023-08-10 (×4): qty 1

## 2023-08-10 MED ORDER — POTASSIUM CHLORIDE CRYS ER 20 MEQ PO TBCR
40.0000 meq | EXTENDED_RELEASE_TABLET | Freq: Once | ORAL | Status: AC
  Administered 2023-08-10: 40 meq via ORAL
  Filled 2023-08-10: qty 2

## 2023-08-10 MED ORDER — LACTATED RINGERS IV SOLN: Freq: Once | INTRAVENOUS | Status: AC

## 2023-08-10 MED ORDER — LOSARTAN POTASSIUM 50 MG PO TABS
50.0000 mg | ORAL_TABLET | Freq: Every day | ORAL | Status: DC
  Administered 2023-08-10 – 2023-08-12 (×3): 50 mg via ORAL
  Filled 2023-08-10 (×3): qty 1

## 2023-08-10 MED ORDER — SODIUM CHLORIDE 0.9% FLUSH
3.0000 mL | Freq: Two times a day (BID) | INTRAVENOUS | Status: DC
  Administered 2023-08-10 – 2023-08-12 (×5): 3 mL via INTRAVENOUS

## 2023-08-10 MED ORDER — POTASSIUM CHLORIDE 10 MEQ/100ML IV SOLN
10.0000 meq | INTRAVENOUS | Status: AC
  Administered 2023-08-10 (×2): 10 meq via INTRAVENOUS
  Filled 2023-08-10 (×2): qty 100

## 2023-08-10 NOTE — ED Notes (Signed)
 Patient to restroom.

## 2023-08-10 NOTE — ED Notes (Signed)
 Lab critical  K- 2.7

## 2023-08-10 NOTE — Telephone Encounter (Signed)
 I have cancelled the 08/12/23 tele preop appt per Dr. Ladona.

## 2023-08-10 NOTE — ED Notes (Signed)
 CCMD Called

## 2023-08-10 NOTE — ED Notes (Signed)
 Attempted to start another iv for better access and patient refused

## 2023-08-10 NOTE — H&P (Signed)
 History and Physical    Patient: Darlene [name] FMW:[id number] DOB: [birth year] DOA: 08/10/2023 DOS: the patient was seen and examined on 08/10/2023 . PCP: [name], [name], NP  Patient coming from: Home Chief complaint: Chief Complaint  Patient presents with   Palpitations   HPI:  Darlene [name] is a 89 y.o. female with past medical history  of used to lactose, on Norco, dust mite, oxycodone, pollen, malignant melanoma, basal cell cancer, essential hypertension, history of stroke on Plavix , atrial flutter, hypothyroidism,generalized anxiety disorder, seasonal and allergic rhinitis, history of dysphagia, history of Schatzki's ring, hiatal hernia, gastritis, GERD, OSA, hard of hearing, osteoarthritis, malignant melanoma of the nasal cavity,coming for palpitation today.  No other complaints of chest pain diaphoresis nausea vomiting diarrhea fevers chills.  No reports of alcohol.  ED MD spoke to cardiology Dr. Ladona .  Patient did have diarrhea 10 days ago states her stool is almost formed now.  ED Course:  Vital signs in the ED were notable for the following:  Vitals:   08/10/23 1042 08/10/23 1045 08/10/23 1100 08/10/23 1456  BP: 135/70 (!) 120/59 114/89 104/74  Pulse: (!) 58 (!) 51 (!) 56 (!) 57  Temp: (!) 97 F (36.1 C)   97.8 F (36.6 C)  Resp:  15 18 16   SpO2: 98% 100% 100% 99%  TempSrc: Oral   Oral  >>ED evaluation thus far shows: CMP shows sodium 146 potassium 2.7 chloride 122 bicarb 17 normal LFTs albumin of 2.4. Normal CBC. EKG shows sinus rhythm sinus bradycardia at 57 PR 181 QTc 419, normal troponin, BNP 64.  D-dimer ordered and pending.   >>While in the ED patient received the following: Medications  sodium chloride  0.9 % bolus 1,000 mL (has no administration in time range)  potassium chloride  SA (KLOR-CON  M) CR tablet 40 mEq (has no administration in time range)  calcium  gluconate 1 g/ 50 mL sodium chloride  IVPB (has no administration in time range)  potassium chloride  10 mEq  in 100 mL IVPB (has no administration in time range)   Review of Systems  Cardiovascular:  Positive for palpitations.  Gastrointestinal:  Positive for diarrhea.   Past Medical History:  Diagnosis Date   Anxiety    Atypical mole 11/11/2020   Left Inner Knee (moderate-free)   Colon polyps    Depression    Hyperlipidemia    Hypertension    Hypothyroidism    Melanoma in situ (HCC) 09/19/2018   Right Arm (excision)   Skin cancer    Sleep apnea    Stroke [facility]) 2008   Past Surgical History:  Procedure Laterality Date   ADENOIDECTOMY     APPENDECTOMY  1960   CATARACT EXTRACTION Bilateral    COLONOSCOPY     ESOPHAGOGASTRODUODENOSCOPY N/A 05/11/2023   Procedure: EGD (ESOPHAGOGASTRODUODENOSCOPY);  Surgeon: Wilhelmenia Aloha Raddle., MD;  Location: [facility];  Service: Gastroenterology;  Laterality: N/A;   GUM SURGERY  03/2019   LUMBAR DISC SURGERY  2014   MELANOMA EXCISION  2020   MESH APPLIED TO LAP PORT     SINUS EXPLORATION     11/17/2021 operation for right nasal cavity and 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   TONSILLECTOMY  1940    reports that she has never smoked. She has never used smokeless tobacco. She reports that she does not currently use alcohol. She reports that she does not use drugs. Allergies  Allergen Reactions   Lactose Intolerance (Gi) Other (See Comments)    indigestion  Norco [Hydrocodone-Acetaminophen ] Nausea And Vomiting   Dust Mite Extract Other (See Comments)    Sinus drainage   Oxycontin [Oxycodone Hcl] Nausea And Vomiting   Pollen Extract Other (See Comments)    Sinus drainage   Family History  Problem Relation Age of Onset   Lung cancer Mother 15       smoker   Heart disease Mother    Stroke Father 94   Heart attack Father    Allergic rhinitis Sister    Hypertension Sister    CVA Brother    Lung cancer Maternal Grandfather    Heart attack Paternal Grandfather    Melanoma Daughter    Psoriasis Daughter     Rheum arthritis Daughter    Bipolar disorder Daughter    Colon cancer Neg Hx    Esophageal cancer Neg Hx    Inflammatory bowel disease Neg Hx    Liver disease Neg Hx    Pancreatic cancer Neg Hx    Stomach cancer Neg Hx    Rectal cancer Neg Hx    Prior to Admission medications   Medication Sig Start Date End Date Taking? Authorizing Provider  ammonium lactate (AMLACTIN) 12 % cream Apply 1 Application topically as needed. 04/14/23   [provider]  azelastine  (ASTELIN ) 0.1 % nasal spray Place into both nostrils daily. Use in each nostril as directed    [provider]  clopidogrel  (PLAVIX ) 75 MG tablet TAKE 1 TABLET BY MOUTH EVERY DAY 06/29/23   [name], [name], NP  clotrimazole-betamethasone  (LOTRISONE) cream Apply 1 Application topically 2 (two) times daily.    [provider]  ibuprofen (ADVIL) 400 MG tablet Take 400 mg by mouth every 6 (six) hours as needed for moderate pain (pain score 4-6).    [provider]  ketoconazole  (NIZORAL ) 2 % shampoo APPLY TOPICALLY 2 TIMES A WEEK 02/28/23   [name], [name], NP  losartan  (COZAAR ) 50 MG tablet Take 1 tablet (50 mg total) by mouth daily. 12/21/22 12/10/24  [name], [name], NP  nitroGLYCERIN  (NITROSTAT ) 0.4 MG SL tablet Place 1 tablet (0.4 mg total) under the tongue every 5 (five) minutes as needed for up to 25 days for chest pain. 12/21/21 01/19/48  Osa Grate, NP  nystatin  cream (MYCOSTATIN ) Apply 1 Application topically 2 (two) times daily. Patient not taking: Reported on 08/04/2023 05/25/23   [name], [name], NP  omeprazole  (PRILOSEC) 40 MG capsule Take 1 capsule (40 mg total) by mouth 2 (two) times daily before a meal. 08/01/23   Lemmon, Delon Gibson, PA  oxymetazoline  (AFRIN NASAL SPRAY) 0.05 % nasal spray Place 1 spray into both nostrils 2 (two) times daily. 11/30/22   Sherlynn Madden, MD  pravastatin  (PRAVACHOL ) 40 MG tablet Take 1 tablet (40 mg total) by mouth daily. 07/12/23   Ladona Heinz, MD  SYNTHROID  100 MCG  tablet TAKE 1 TABLET BY MOUTH EVERY DAY BEFORE BREAKFAST 10/25/22   [name], [name], NP  UNABLE TO FIND Med Name: Pilocarpine hcl (mucolox) 4mg /ml, use 2-4 mls to swish and gargle for 1-2 minutes then spit out    [provider]  Vitamin D, Ergocalciferol, (DRISDOL) 1.25 MG (50000 UNIT) CAPS capsule TAKE 1 CAPSULE BY MOUTH ONE TIME PER WEEK 03/18/23   [name], [name], NP  Zolpidem  Tartrate 3.5 MG SUBL TAKE 1 TABLET BY MOUTH EVERY DAY AT BEDTIME AS NEEDED 07/01/23   [name], [name], NP  Zolpidem  Tartrate 3.5 MG SUBL TAKE 1 TABLET BY MOUTH EVERY DAY AT  BEDTIME AS NEEDED Patient not taking: Reported on 08/04/2023 07/01/23   [name], [name], NP                                                                                 Vitals:   08/10/23 1042 08/10/23 1045 08/10/23 1100 08/10/23 1456  BP: 135/70 (!) 120/59 114/89 104/74  Pulse: (!) 58 (!) 51 (!) 56 (!) 57  Resp:  15 18 16   Temp: (!) 97 F (36.1 C)   97.8 F (36.6 C)  TempSrc: Oral   Oral  SpO2: 98% 100% 100% 99%   Physical Exam Vitals reviewed.  Constitutional:      General: She is not in acute distress.    Appearance: She is not ill-appearing.  HENT:     Head: Normocephalic and atraumatic.  Eyes:     Extraocular Movements: Extraocular movements intact.  Cardiovascular:     Rate and Rhythm: Normal rate and regular rhythm.     Heart sounds: Normal heart sounds.  Pulmonary:     Effort: Pulmonary effort is normal.     Breath sounds: Examination of the right-middle field reveals rales. Examination of the left-middle field reveals rales. Examination of the right-lower field reveals rales. Examination of the left-lower field reveals rales. Rales present.  Abdominal:     General: There is no distension.     Palpations: Abdomen is soft.     Tenderness: There is no abdominal tenderness.  Neurological:     General: No focal deficit present.     Mental Status: She is alert and oriented to person, place, and time.     Labs on Admission: I have  personally reviewed following labs and imaging studies CBC: Recent Labs  Lab 08/10/23 1144  WBC 5.8  NEUTROABS 3.6  HGB 13.5  HCT 41.3  MCV 91.2  PLT 218   Basic Metabolic Panel: Recent Labs  Lab 08/10/23 1143 08/10/23 1144  NA  --  146*  K  --  2.7*  CL  --  122*  CO2  --  17*  GLUCOSE  --  84  BUN  --  9  CREATININE  --  0.47  CALCIUM   --  6.5*  MG 1.5*  --    GFR: Estimated Creatinine Clearance: 45.9 mL/min (by C-G formula based on SCr of 0.47 mg/dL). Liver Function Tests: Recent Labs  Lab 08/10/23 1144  AST 13*  ALT 12  ALKPHOS 39  BILITOT 0.6  PROT 4.2*  ALBUMIN 2.4*   No results for input(s): LIPASE, AMYLASE in the last 168 hours. No results for input(s): AMMONIA in the last 168 hours. Coagulation Profile: No results for input(s): INR, PROTIME in the last 168 hours. Cardiac Enzymes: No results for input(s): CKTOTAL, CKMB, CKMBINDEX, TROPONINI in the last 168 hours. BNP (last 3 results) No results for input(s): PROBNP in the last 8760 hours. HbA1C: No results for input(s): HGBA1C in the last 72 hours. CBG: No results for input(s): GLUCAP in the last 168 hours. Lipid Profile: No results for input(s): CHOL, HDL, LDLCALC, TRIG, CHOLHDL, LDLDIRECT in the last 72 hours. Thyroid  Function Tests: No results for input(s): TSH, T4TOTAL, FREET4, T3FREE, THYROIDAB in  the last 72 hours. Anemia Panel: No results for input(s): VITAMINB12, FOLATE, FERRITIN, TIBC, IRON, RETICCTPCT in the last 72 hours. Urine analysis:    Component Value Date/Time   COLORURINE YELLOW 08/10/2023 1144   APPEARANCEUR CLEAR 08/10/2023 1144   LABSPEC 1.014 08/10/2023 1144   PHURINE 7.0 08/10/2023 1144   GLUCOSEU NEGATIVE 08/10/2023 1144   HGBUR NEGATIVE 08/10/2023 1144   BILIRUBINUR NEGATIVE 08/10/2023 1144   BILIRUBINUR Negative 04/19/2023 1526   KETONESUR NEGATIVE 08/10/2023 1144   PROTEINUR NEGATIVE 08/10/2023 1144    UROBILINOGEN negative (A) 04/19/2023 1526   NITRITE NEGATIVE 08/10/2023 1144   LEUKOCYTESUR NEGATIVE 08/10/2023 1144   Radiological Exams on Admission: DG Chest Portable 1 View Result Date: 08/10/2023 CLINICAL DATA:  Weakness. EXAM: PORTABLE CHEST 1 VIEW COMPARISON:  05/06/2023. FINDINGS: The heart size and mediastinal contours are within normal limits. Aortic atherosclerosis. No focal consolidation, pleural effusion, or pneumothorax. No acute osseous abnormality. IMPRESSION: No acute cardiopulmonary findings. Electronically Signed   By: Harrietta Sherry M.D.   On: 08/10/2023 11:30   Data Reviewed: Relevant notes from primary care and specialist visits, past discharge summaries as available in EHR, including Care Everywhere . Prior diagnostic testing as pertinent to current admission diagnoses, Updated medications and problem lists for reconciliation .ED course, including vitals, labs, imaging, treatment and response to treatment,Triage notes, nursing and pharmacy notes and ED provider's notes.Notable results as noted in HPI.Discussed case with EDMD/ ED APP/ or Specialty MD on call and as needed.  Assessment & Plan  >> Palpitations Differentials are most likely she has electrolyte related intermittent atrial flutter or A-fib. Will correct electrolytes and follow-up.   >> Prediabetes: Will add an A1c.  >> Abnormal lung auscultation: Patient having rales posteriorly on auscultation chest x-ray is within normal limits.  Troponin is less than 2, will get D-dimer and then proceed with CT chest PE protocol if needed for CT chest with contrast.   >> Nonanion gap metabolic acidosis: Suspect from GI losses we will hydrate and will follow.   >> Electrolyte abnormality: Hypokalemia: Will replace and follow levels. Magnesium  level ordered and pending. Hypokalemia.   >> Essential hypertension: Patient is currently on losartan  50.    >> Hypothyroidism: Patient is on Synthroid  100  mcg. FT4/TSH   DVT prophylaxis:  SCDs Consults:  None  Advance Care Planning:    Code Status: Full Code   Family Communication:  None Disposition Plan:  Home Severity of Illness: The appropriate patient status for this patient is OBSERVATION. Observation status is judged to be reasonable and necessary in order to provide the required intensity of service to ensure the patient's safety. The patient's presenting symptoms, physical exam findings, and initial radiographic and laboratory data in the context of their medical condition is felt to place them at decreased risk for further clinical deterioration. Furthermore, it is anticipated that the patient will be medically stable for discharge from the hospital within 2 midnights of admission.   Unresulted Labs (From admission, onward)     Start     Ordered   08/11/23 0500  Comprehensive metabolic panel  Tomorrow morning,   R        08/10/23 1452   08/11/23 0500  CBC  Tomorrow morning,   R        08/10/23 1452   08/10/23 1446  Hemoglobin A1c  Add-on,   AD        08/10/23 1445   08/10/23 1401  T4, free  Add-on,   AD  08/10/23 1419   08/10/23 1401  TSH  Add-on,   AD        08/10/23 1419   08/10/23 1350  D-dimer, quantitative  ONCE - STAT,   STAT        08/10/23 1349   08/10/23 1333  Ethanol  Add-on,   AD        08/10/23 1332            Meds ordered this encounter  Medications   sodium chloride  0.9 % bolus 1,000 mL   DISCONTD: potassium chloride  10 mEq in 100 mL IVPB   potassium chloride  SA (KLOR-CON  M) CR tablet 40 mEq   calcium  gluconate 1 g/ 50 mL sodium chloride  IVPB   potassium chloride  10 mEq in 100 mL IVPB   clopidogrel  (PLAVIX ) tablet 75 mg   losartan  (COZAAR ) tablet 50 mg   pravastatin  (PRAVACHOL ) tablet 40 mg   fesoterodine  (TOVIAZ ) tablet 4 mg   levothyroxine  (SYNTHROID ) tablet 100 mcg   sodium chloride  flush (NS) 0.9 % injection 3 mL   heparin  injection 5,000 Units   albuterol  (PROVENTIL ) (2.5  MG/3ML) 0.083% nebulizer solution 2.5 mg   lactated ringers  infusion     Orders Placed This Encounter  Procedures   DG Chest Portable 1 View   CBC with Differential   Comprehensive metabolic panel   Urinalysis, Routine w reflex microscopic -Urine, Clean Catch   Magnesium    Ethanol   D-dimer, quantitative   T4, free   TSH   Hemoglobin A1c   Comprehensive metabolic panel   CBC   Diet Heart Room service appropriate? Yes; Fluid consistency: Thin   Maintain IV access   Vital signs   Notify physician (specify)   Mobility Protocol: No Restrictions RN to initiate protocols based on patient's level of care   Refer to Sidebar Report Refer to ICU, Med-Surg, Progressive, and Step-Down Mobility Protocol Sidebars   Initiate Adult Central Line Maintenance and Catheter Protocol for patients with central line (CVC, PICC, Port, Hemodialysis, Trialysis)   Daily weights   Intake and Output   Do not place and if present remove PureWick   Initiate Oral Care Protocol   Initiate Carrier Fluid Protocol   RN may order General Admission PRN Orders utilizing General Admission PRN medications (through manage orders) for the following patient needs: allergy  symptoms (Claritin ), cold sores (Carmex), cough (Robitussin DM), eye irritation (Liquifilm Tears), hemorrhoids (Tucks), indigestion (Maalox), minor skin irritation (Hydrocortisone  Cream), muscle pain Lucienne Gay), nose irritation (saline nasal spray) and sore throat (Chloraseptic spray).   Cardiac Monitoring Continuous x 48 hours Indications for use: Other; Other indications for use: palpitation.   Full code   Consult to hospitalist   Pulse oximetry check with vital signs   Oxygen therapy Mode or (Route): Nasal cannula; Liters Per Minute: 2; Keep O2 saturation between: greater than 92 %   I-Stat CG4 Lactic Acid   EKG 12-Lead   Admit to Inpatient (patient's expected length of stay will be greater than 2 midnights or inpatient only procedure)     Author: Mario LULLA Blanch, MD 12 pm- 8 pm. [facility]. 08/10/2023 3:02 PM Please note for any communication after hours contact [facility] Assigned provider on call on Amion.

## 2023-08-10 NOTE — Telephone Encounter (Signed)
 Left message on machine to call back

## 2023-08-10 NOTE — Consult Note (Signed)
 Cardiology Consultation   Patient ID: Darlene [name] MRN: [id number]; DOB: 26-Dec-1933  Admit date: 08/10/2023 Date of Consult: 08/10/2023  PCP:  [name], [name], NP   [facility] Providers Cardiologist:  Gordy Bergamo, MD        Patient Profile:   Darlene [name] is a 89 y.o. female with a hx of chronic palpitations who is being seen 08/10/2023 for the evaluation of palpitations and occasional chest pain at the request of Juliene Bicker (ED Physician).  History of Present Illness:   Darlene [name] is a 89 y.o.  Caucasian female with hypertension, hyperlipidemia, prediabetes, h/o sleep apnea not on CPAP, prior history of stroke in 2006 without residual defect, on Plavix  chronically, esophageal stricture with severe GERD and also postnasal drip. She has had frequent palpitations, difficulty in controlling hypertension, extreme anxiety, multiple medication intolerances.  I had last seen her on 05/21/2023.  She also has episodes of chest discomfort with radiation to her neck suggestive of Prinzmetal's angina pectoris.  She has had coronary CTA on 09/01/2020 revealing normal coronary arteries.   Past Medical History:  Diagnosis Date   Anxiety    Atypical mole 11/11/2020   Left Inner Knee (moderate-free)   Colon polyps    Depression    Hyperlipidemia    Hypertension    Hypothyroidism    Melanoma in situ (HCC) 09/19/2018   Right Arm (excision)   Skin cancer    Sleep apnea    Stroke [facility]) 2008    Past Surgical History:  Procedure Laterality Date   ADENOIDECTOMY     APPENDECTOMY  1960   CATARACT EXTRACTION Bilateral    COLONOSCOPY     ESOPHAGOGASTRODUODENOSCOPY N/A 05/11/2023   Procedure: EGD (ESOPHAGOGASTRODUODENOSCOPY);  Surgeon: Wilhelmenia Aloha Raddle., MD;  Location: [facility];  Service: Gastroenterology;  Laterality: N/A;   GUM SURGERY  03/2019   LUMBAR DISC SURGERY  2014   MELANOMA EXCISION  2020   MESH APPLIED TO LAP PORT     SINUS EXPLORATION     11/17/2021 operation  for right nasal cavity and 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   TONSILLECTOMY  1940       Inpatient Medications: Scheduled Meds:  clopidogrel   75 mg Oral Daily   fesoterodine   4 mg Oral Daily   heparin   5,000 Units Subcutaneous Q12H   [START ON 08/11/2023] levothyroxine   100 mcg Oral Q0600   losartan   50 mg Oral Daily   potassium chloride   40 mEq Oral Once   pravastatin   40 mg Oral Daily   sodium chloride  flush  3 mL Intravenous Q12H   Continuous Infusions:  calcium  gluconate 1,000 mg (08/10/23 1451)   lactated ringers      potassium chloride  10 mEq (08/10/23 1453)   PRN Meds: albuterol   Allergies:    Allergies  Allergen Reactions   Lactose Intolerance (Gi) Other (See Comments)    indigestion   Norco [Hydrocodone-Acetaminophen ] Nausea And Vomiting   Dust Mite Extract Other (See Comments)    Sinus drainage   Oxycontin [Oxycodone Hcl] Nausea And Vomiting   Pollen Extract Other (See Comments)    Sinus drainage   Social History   Tobacco Use   Smoking status: Never   Smokeless tobacco: Never  Substance Use Topics   Alcohol use: Not Currently    Comment: hardly ever    ROS:  Review of Systems  Cardiovascular:  Positive for chest pain and palpitations. Negative for dyspnea on exertion and leg swelling.  Physical Exam    Vitals:   08/10/23 1042 08/10/23 1045 08/10/23 1100 08/10/23 1456  BP: 135/70 (!) 120/59 114/89 104/74  Pulse: (!) 58 (!) 51 (!) 56 (!) 57  Resp:  15 18 16   Temp: (!) 97 F (36.1 C)   97.8 F (36.6 C)  TempSrc: Oral   Oral  SpO2: 98% 100% 100% 99%   Physical Exam Neck:     Vascular: No carotid bruit or JVD.  Cardiovascular:     Rate and Rhythm: Normal rate and regular rhythm.     Pulses: Intact distal pulses.     Heart sounds: Normal heart sounds. No murmur heard.    No gallop.  Pulmonary:     Effort: Pulmonary effort is normal.     Breath sounds: Normal breath sounds.  Abdominal:     General: Bowel  sounds are normal.     Palpations: Abdomen is soft.  Musculoskeletal:     Right lower leg: No edema.     Left lower leg: No edema.     No intake or output data in the 24 hours ending 08/10/23 1531    08/04/2023    9:36 AM 07/29/2023   10:11 AM 07/22/2023    3:00 PM  Last 3 Weights  Weight (lbs) 156 lb 156 lb 157 lb  Weight (kg) 70.761 kg 70.761 kg 71.215 kg     Net IO Since Admission: No IO data has been entered for this period [08/10/23 1531]  Labs   Lab Results  Component Value Date   NA 146 (H) 08/10/2023   K 2.7 (LL) 08/10/2023   CO2 17 (L) 08/10/2023   GLUCOSE 84 08/10/2023   BUN 9 08/10/2023   CREATININE 0.47 08/10/2023   CALCIUM  6.5 (L) 08/10/2023   EGFR 77 04/19/2023   GFRNONAA >60 08/10/2023       Latest Ref Rng & Units 08/10/2023   11:44 AM 07/22/2023    4:05 PM 05/06/2023   11:14 AM  BMP  Glucose 70 - 99 mg/dL 84  96  883   BUN 8 - 23 mg/dL 9  20  15    Creatinine 0.44 - 1.00 mg/dL 9.52  9.23  9.54   Sodium 135 - 145 mmol/L 146  143  142   Potassium 3.5 - 5.1 mmol/L 2.7  4.1  3.9   Chloride 98 - 111 mmol/L 122  111  111   CO2 22 - 32 mmol/L 17  25  23    Calcium  8.9 - 10.3 mg/dL 6.5  9.4  9.5        Latest Ref Rng & Units 08/10/2023   11:44 AM 07/22/2023    4:05 PM 05/06/2023   11:14 AM  CBC  WBC 4.0 - 10.5 K/uL 5.8  7.5  5.6   Hemoglobin 12.0 - 15.0 g/dL 86.4  86.8  86.1   Hematocrit 36.0 - 46.0 % 41.3  40.2  42.9   Platelets 150 - 400 K/uL 218  214  231     Lab Results  Component Value Date   CHOL 163 05/12/2021   HDL 44 05/12/2021   LDLCALC 94 05/12/2021   TRIG 144 05/12/2021   CHOLHDL 3.5 12/26/2019    Lab Results  Component Value Date   TSH 0.77 03/14/2023    Lab Results  Component Value Date   HGBA1C 5.8 (H) 08/10/2023    High Sensitivity Troponin:   Recent Labs  Lab 08/10/23 1144  TROPONINIHS <2  Cardiac Panel (last 3 results) Recent Labs    08/10/23 1144  TROPONINIHS <2    BNP (last 3 results) Recent Labs     05/06/23 1114  BNP 64.0    Tele/EKG/Cardiac studies    EKG:  EKG 08/10/2023: Normal sinus rhythm with rate of 57 bpm, normal axis, no evidence of ischemia, normal EKG.  Radiology  DG Chest Portable 1 View Result Date: 08/10/2023 CLINICAL DATA:  Weakness. EXAM: PORTABLE CHEST 1 VIEW COMPARISON:  05/06/2023. FINDINGS: The heart size and mediastinal contours are within normal limits. Aortic atherosclerosis. No focal consolidation, pleural effusion, or pneumothorax. No acute osseous abnormality. IMPRESSION: No acute cardiopulmonary findings. Electronically Signed   By: Harrietta Sherry M.D.   On: 08/10/2023 11:30    Assessment & Plan .     1.  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 2.  Palpitations.  His symptoms clearly suggest PACs and PVCs and I do not suspect any other significant arrhythmias.  We could try propranolol  10 mg p.o. twice daily as needed. 3.  Hypokalemia 4.  Preoperative cardiovascular workup for upcoming urinary bladder surgery  Patient has had diarrhea and suspect GI loss to the reason for her electrolyte abnormality.  From cardiac standpoint no further workup is indicated and will leave it to primary team to further evaluate.  Happy to see her back if necessary.  From cardiac standpoint she is low risk to proceed with surgery, I will send a note to her surgeon.  Will cancel her visit with me that is upcoming.   Gordy Bergamo, MD, [facility] 08/10/2023, 3:31 PM [facility] [address] [place], [place] [address] Phone: [phone]. Fax:  [phone]

## 2023-08-10 NOTE — ED Notes (Signed)
 Patient crying stating her IV is burning. Potassium paused. Patient refusing attempt at another IV. Patient states,  I can't take it anymore. Stop it. MD aware.

## 2023-08-10 NOTE — Progress Notes (Signed)
 1858: Patient arrived to floor. Patient vital signs measured; Tele box connected. Pt settled with personal belongings and call bell within reach.

## 2023-08-10 NOTE — ED Provider Notes (Signed)
 [place] EMERGENCY DEPARTMENT AT [facility] Provider Note   CSN: [id number] Arrival date & time: 08/10/23  1024     Patient presents with: Palpitations   Darlene [name] is a 89 y.o. female.   Patient here with palpitations.  She states that she has been having some palpitations on and off for the last few days.  Maybe some chest discomfort with it.  Denies any weakness numbness tingling.  No headache no neck pain no falls.  Lives by herself in independent living.  She has history of hypertension high cholesterol stroke depression.  She denies any pain with urination cough or sputum production.  The history is provided by the patient.       Prior to Admission medications   Medication Sig Start Date End Date Taking? Authorizing Provider  ammonium lactate (AMLACTIN) 12 % cream Apply 1 Application topically as needed. 04/14/23   [provider]  azelastine  (ASTELIN ) 0.1 % nasal spray Place into both nostrils daily. Use in each nostril as directed    [provider]  clopidogrel  (PLAVIX ) 75 MG tablet TAKE 1 TABLET BY MOUTH EVERY DAY 06/29/23   [name], [name], NP  clotrimazole-betamethasone  (LOTRISONE) cream Apply 1 Application topically 2 (two) times daily.    [provider]  ibuprofen (ADVIL) 400 MG tablet Take 400 mg by mouth every 6 (six) hours as needed for moderate pain (pain score 4-6).    [provider]  ketoconazole  (NIZORAL ) 2 % shampoo APPLY TOPICALLY 2 TIMES A WEEK 02/28/23   [name], [name], NP  losartan  (COZAAR ) 50 MG tablet Take 1 tablet (50 mg total) by mouth daily. 12/21/22 12/10/24  [name], [name], NP  nitroGLYCERIN  (NITROSTAT ) 0.4 MG SL tablet Place 1 tablet (0.4 mg total) under the tongue every 5 (five) minutes as needed for up to 25 days for chest pain. 12/21/21 01/19/48  Osa Grate, NP  nystatin  cream (MYCOSTATIN ) Apply 1 Application topically 2 (two) times daily. Patient not taking: Reported on 08/04/2023 05/25/23   [name], [name],  NP  omeprazole  (PRILOSEC) 40 MG capsule Take 1 capsule (40 mg total) by mouth 2 (two) times daily before a meal. 08/01/23   Lemmon, Delon Gibson, PA  oxymetazoline  (AFRIN NASAL SPRAY) 0.05 % nasal spray Place 1 spray into both nostrils 2 (two) times daily. 11/30/22   Sherlynn Madden, MD  pravastatin  (PRAVACHOL ) 40 MG tablet Take 1 tablet (40 mg total) by mouth daily. 07/12/23   Ladona Heinz, MD  SYNTHROID  100 MCG tablet TAKE 1 TABLET BY MOUTH EVERY DAY BEFORE BREAKFAST 10/25/22   [name], [name], NP  UNABLE TO FIND Med Name: Pilocarpine hcl (mucolox) 4mg /ml, use 2-4 mls to swish and gargle for 1-2 minutes then spit out    [provider]  Vitamin D, Ergocalciferol, (DRISDOL) 1.25 MG (50000 UNIT) CAPS capsule TAKE 1 CAPSULE BY MOUTH ONE TIME PER WEEK 03/18/23   [name], [name], NP  Zolpidem  Tartrate 3.5 MG SUBL TAKE 1 TABLET BY MOUTH EVERY DAY AT BEDTIME AS NEEDED 07/01/23   [name], [name], NP  Zolpidem  Tartrate 3.5 MG SUBL TAKE 1 TABLET BY MOUTH EVERY DAY AT BEDTIME AS NEEDED Patient not taking: Reported on 08/04/2023 07/01/23   [name], [name], NP    Allergies: Lactose intolerance (gi), Norco [hydrocodone-acetaminophen ], Dust mite extract, Oxycontin [oxycodone hcl], and Pollen extract    Review of Systems  Updated Vital Signs BP 114/89   Pulse (!) 56   Temp (!) 97 F (36.1  C) (Oral)   Resp 18   SpO2 100%   Physical Exam Vitals and nursing note reviewed.  Constitutional:      General: She is not in acute distress.    Appearance: She is well-developed. She is not ill-appearing.  HENT:     Head: Normocephalic and atraumatic.     Nose: Nose normal.     Mouth/Throat:     Mouth: Mucous membranes are moist.  Eyes:     Extraocular Movements: Extraocular movements intact.     Conjunctiva/sclera: Conjunctivae normal.     Pupils: Pupils are equal, round, and reactive to light.  Cardiovascular:     Rate and Rhythm: Normal rate and regular rhythm.     Pulses: Normal pulses.     Heart sounds:  Normal heart sounds. No murmur heard. Pulmonary:     Effort: Pulmonary effort is normal. No respiratory distress.     Breath sounds: Normal breath sounds.  Abdominal:     General: Abdomen is flat.     Palpations: Abdomen is soft.     Tenderness: There is no abdominal tenderness.  Musculoskeletal:        General: No swelling.     Cervical back: Normal range of motion and neck supple.  Skin:    General: Skin is warm and dry.     Capillary Refill: Capillary refill takes less than 2 seconds.  Neurological:     General: No focal deficit present.     Mental Status: She is alert and oriented to person, place, and time.     Cranial Nerves: No cranial nerve deficit.     Sensory: No sensory deficit.     Motor: No weakness.     Coordination: Coordination normal.     Comments: Normal strength and sensation, normal speech  Psychiatric:        Mood and Affect: Mood normal.     (all labs ordered are listed, but only abnormal results are displayed) Labs Reviewed  COMPREHENSIVE METABOLIC PANEL WITH GFR - Abnormal; Notable for the following components:      Result Value   Sodium 146 (*)    Potassium 2.7 (*)    Chloride 122 (*)    CO2 17 (*)    Calcium  6.5 (*)    Total Protein 4.2 (*)    Albumin 2.4 (*)    AST 13 (*)    All other components within normal limits  CBC WITH DIFFERENTIAL/PLATELET  URINALYSIS, ROUTINE W REFLEX MICROSCOPIC  TROPONIN I (HIGH SENSITIVITY)  TROPONIN I (HIGH SENSITIVITY)    EKG: EKG Interpretation Date/Time:  Wednesday August 10 2023 10:32:09 EDT Ventricular Rate:  57 PR Interval:  181 QRS Duration:  89 QT Interval:  430 QTC Calculation: 419 R Axis:   32  Text Interpretation: Sinus rhythm Low voltage, precordial leads s Confirmed by Ruthe Cornet [phone]) on 08/10/2023 10:49:38 AM  Radiology: [facility] Chest Portable 1 View Result Date: 08/10/2023 CLINICAL DATA:  Weakness. EXAM: PORTABLE CHEST 1 VIEW COMPARISON:  05/06/2023. FINDINGS: The heart size and  mediastinal contours are within normal limits. Aortic atherosclerosis. No focal consolidation, pleural effusion, or pneumothorax. No acute osseous abnormality. IMPRESSION: No acute cardiopulmonary findings. Electronically Signed   By: Harrietta Sherry M.D.   On: 08/10/2023 11:30     Procedures   Medications Ordered in the ED  sodium chloride  0.9 % bolus 1,000 mL (has no administration in time range)  potassium chloride  SA (KLOR-CON  M) CR tablet 40 mEq (has no administration in  time range)  calcium  gluconate 1 g/ 50 mL sodium chloride  IVPB (has no administration in time range)  potassium chloride  10 mEq in 100 mL IVPB (has no administration in time range)                                    Medical Decision Making Amount and/or Complexity of Data Reviewed Labs: ordered. Radiology: ordered.  Risk Prescription drug management. Decision regarding hospitalization.   Sallie Staron is here with palpitations, dry mouth decreased energy.  Vital signs unremarkable.  EKG shows sinus rhythm.  No ischemic changes.  History of hypertension high cholesterol.  Differential diagnosis could be ACS versus electrolyte abnormality versus may be paroxysmal A-fib a flutter.  She follows with Dr. Ladona with cardiology.  He was down in the ED evaluating another patient for me and he actually came over to talk to the patient.  He was thinking maybe starting propranolol  10 mg twice daily as needed if lab work was unremarkable for possible palpitations.  Ultimately atypical story for ACS.  No concern for PE.  Does not Solik infectious process.  Basic labs chest x-ray were obtained and per my review and interpretation are significant for hypokalemia with a potassium of 2.7 hypocalcemia with a potassium of 6.5.  Chest x-ray showed no evidence of pneumonia or pneumothorax.  Ultimately we will pleat potassium IV and orally and will replete IV calcium  but I think she benefit from admission given the electrolyte  abnormalities palpitations generalized weakness.  Will talk to hospitalist for admission.  This chart was dictated using voice recognition software.  Despite best efforts to proofread,  errors can occur which can change the documentation meaning.      Final diagnoses:  Palpitations  Hypokalemia  Hypocalcemia    ED Discharge Orders     None          Ruthe Cornet, DO 08/10/23 1312

## 2023-08-10 NOTE — ED Triage Notes (Signed)
 Patient BIB [facility] c/o chest palpations for 2 days. Took 2 home nitros, refused aspirin from EMS. EKG negative  118/80 80HR 20R 94% RA 131 CBG 20 left forearm

## 2023-08-11 DIAGNOSIS — E876 Hypokalemia: Secondary | ICD-10-CM

## 2023-08-11 DIAGNOSIS — R002 Palpitations: Secondary | ICD-10-CM | POA: Diagnosis not present

## 2023-08-11 LAB — COMPREHENSIVE METABOLIC PANEL WITH GFR
ALT: 16 U/L (ref 0–44)
AST: 19 U/L (ref 15–41)
Albumin: 3.5 g/dL (ref 3.5–5.0)
Alkaline Phosphatase: 55 U/L (ref 38–126)
Anion gap: 10 (ref 5–15)
BUN: 11 mg/dL (ref 8–23)
CO2: 22 mmol/L (ref 22–32)
Calcium: 9.5 mg/dL (ref 8.9–10.3)
Chloride: 111 mmol/L (ref 98–111)
Creatinine, Ser: 0.73 mg/dL (ref 0.44–1.00)
GFR, Estimated: >60 mL/min (ref >60)
Glucose, Bld: 97 mg/dL (ref 70–99)
Potassium: 4.3 mmol/L (ref 3.5–5.1)
Sodium: 143 mmol/L (ref 135–145)
Total Bilirubin: 0.9 mg/dL (ref 0.0–1.2)
Total Protein: 6.3 g/dL — ABNORMAL LOW (ref 6.5–8.1)

## 2023-08-11 LAB — CBC
HCT: 41.2 % (ref 36.0–46.0)
Hemoglobin: 13.8 g/dL (ref 12.0–15.0)
MCH: 29.9 pg (ref 26.0–34.0)
MCHC: 33.5 g/dL (ref 30.0–36.0)
MCV: 89.4 fL (ref 80.0–100.0)
Platelets: 234 K/uL (ref 150–400)
RBC: 4.61 MIL/uL (ref 3.87–5.11)
RDW: 12.7 % (ref 11.5–15.5)
WBC: 5.5 K/uL (ref 4.0–10.5)
nRBC: 0 % (ref 0.0–0.2)

## 2023-08-11 MED ORDER — PROPRANOLOL HCL 10 MG PO TABS
10.0000 mg | ORAL_TABLET | Freq: Two times a day (BID) | ORAL | Status: DC | PRN
  Administered 2023-08-12: 10 mg via ORAL
  Filled 2023-08-11: qty 1

## 2023-08-11 MED ORDER — MAGNESIUM SULFATE 2 GM/50ML IV SOLN
2.0000 g | Freq: Once | INTRAVENOUS | Status: AC
  Administered 2023-08-11: 2 g via INTRAVENOUS
  Filled 2023-08-11: qty 50

## 2023-08-11 MED ORDER — LEVOTHYROXINE SODIUM 75 MCG PO TABS
75.0000 ug | ORAL_TABLET | Freq: Every day | ORAL | Status: DC
  Administered 2023-08-12: 75 ug via ORAL
  Filled 2023-08-11: qty 1

## 2023-08-11 NOTE — TOC Initial Note (Addendum)
 Transition of Care [facility]) - Initial/Assessment Note    Patient Details  Name: Darlene [name] MRN: [id number] Date of Birth: January 28, 1933  Transition of Care [facility]) CM/SW Contact:    Luise JAYSON Pan, LCSWA Phone Number: 08/11/2023, 1:04 PM  Clinical Narrative:   Per daily meeting with treatment team, patient is from [facility]. CSW confirmed with [facility] and son Cathey (goes by [place])) that patient is from [facility]. [facility] would like to know about what PT/OT recommend, contact is Katie [phone]. CSW messaged treatment team for PT/OT orders to go in.   CSW spoke with patient and introduced self/role. Patient states she lives at the ILF alone. 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 No hx of HH or SNF. Patient confirmed she [facility] uses [facility] on [address] [place]. Patient stated that at discharge she can have her friend transport her home.   TOC will continue to follow.                  Expected Discharge Plan: Home/Self Care (From [facility]) Barriers to Discharge: Continued Medical Work up   Patient Goals and CMS Choice Patient states their goals for this hospitalization and ongoing recovery are:: To go home          Expected Discharge Plan and Services In-house Referral: Clinical Social Work     Living arrangements for the past 2 months: Independent Living Facility                                      Prior Living Arrangements/Services Living arrangements for the past 2 months: Independent Living Facility Lives with:: Self                   Activities of Daily Living   ADL Screening (condition at time of admission) Independently performs ADLs?: Yes (appropriate for developmental age) Is the patient deaf or have difficulty hearing?: No Does the patient have difficulty seeing, even when wearing glasses/contacts?: No Does the patient have difficulty concentrating, remembering, or making decisions?:  No  Permission Sought/Granted Permission sought to share information with : Facility Medical sales representative, Family Supports Permission granted to share information with : Yes, Verbal Permission Granted              Emotional Assessment              Admission diagnosis:  Hypocalcemia [E83.51] Palpitations [R00.2] Hypokalemia [E87.6] Patient Active Problem List   Diagnosis Date Noted   Fatigue 07/14/2023   Gastritis without bleeding 05/11/2023   Dry mouth 03/10/2023   Right knee meniscal tear 12/08/2022   Body aches 10/07/2022   Abdominal pain, chronic, epigastric 08/07/2022   Abdominal bloating 08/07/2022   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) 08/07/2022   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   Osteoarthritis, multiple sites 11/05/2021   Anxiety 10/29/2021   SCC (squamous cell carcinoma) 09/10/2021   Right-sided epistaxis 08/27/2021   HOH (hard of hearing) 08/20/2021   Sepsis (HCC) 06/06/2021   Lactose intolerance 05/14/2021   Osteopenia after menopause 01/22/2021   OSA (obstructive sleep apnea) 11/27/2020   Postmenopause 11/27/2020   Abnormal barium swallow 10/30/2020   Precordial pain    Other spondylosis with radiculopathy, cervical region 02/19/2020   Prediabetes 01/09/2020   Adverse reaction to COVID-19 vaccine 11/29/2019   Chronic  cough 11/24/2019   Melanoma of skin (HCC) 08/24/2019   Palpitations 06/07/2019   Schatzki's ring 05/12/2019   Hiatal hernia 05/12/2019   Insomnia 03/29/2019   Back pain with left-sided sciatica 03/01/2019   Daytime sleepiness 11/30/2018   History of colonic polyps 09/03/2018   Dysphagia 09/03/2018   Pyrosis 09/03/2018   Chronic sore throat 09/03/2018   Actinic keratoses 02/23/2018   Seasonal and perennial allergic rhinitis 01/31/2018   Infected surgical wound 12/01/2017   Incontinent of urine 12/01/2017   Essential hypertension 09/06/2017   History of stroke 09/06/2017   Hyperlipidemia  09/06/2017   GAD (generalized anxiety disorder) 09/06/2017   Hypothyroidism 09/06/2017   Chronic sinusitis 09/06/2017   Gastroesophageal reflux disease 09/06/2017   PCP:  [name], [name], NP Pharmacy:   [id number] [address] [place], [place] - [address] [place] [address] Phone: [phone] Fax: [phone]     Social Drivers of Health (SDOH) Social History: SDOH Screenings   Food Insecurity: No Food Insecurity (08/10/2023)  Housing: Low Risk  (08/10/2023)  Transportation Needs: No Transportation Needs (08/10/2023)  Utilities: Not At Risk (08/10/2023)  Depression (PHQ2-9): Low Risk  (05/19/2023)  Social Connections: Unknown (08/10/2023)  Tobacco Use: Low Risk  (08/10/2023)   SDOH Interventions:     Readmission Risk Interventions    06/08/2021   10:59 AM  Readmission Risk Prevention Plan  Transportation Screening Complete  PCP or Specialist Appt within 5-7 Days Complete  Home Care Screening Complete  Medication Review (RN CM) Complete

## 2023-08-11 NOTE — Plan of Care (Signed)

## 2023-08-11 NOTE — Telephone Encounter (Signed)
 I spoke with the pt and she tells me that she is currently admitted and will call back after cardiac workup for followup

## 2023-08-11 NOTE — Plan of Care (Signed)

## 2023-08-11 NOTE — Plan of Care (Signed)
  Problem: Health Behavior/Discharge Planning: Goal: Ability to manage health-related needs will improve Outcome: Progressing   Problem: Nutrition: Goal: Adequate nutrition will be maintained Outcome: Progressing   Problem: Pain Managment: Goal: General experience of comfort will improve and/or be controlled Outcome: Progressing

## 2023-08-11 NOTE — Care Management CC44 (Signed)
 Condition Code 44 Documentation Completed  Patient Details  Name: Darlene [name] MRN: [id number] Date of Birth: 1933-06-19   Condition Code 44 given:  Yes Patient signature on Condition Code 44 notice:  Yes Documentation of 2 MD's agreement:  Yes Code 44 added to claim:  Yes    Rosalva Jon Bloch, RN 08/11/2023, 2:24 PM

## 2023-08-11 NOTE — Progress Notes (Signed)
 [facility]  PROGRESS NOTE  Darlene [name] FMW:[id number] DOB: 05-21-1933 DOA: 08/10/2023 PCP: [name], [name], NP   Brief HPI:     89 y.o. female with past medical history  of used to lactose, on Norco, dust mite, oxycodone, pollen, malignant melanoma, basal cell cancer, essential hypertension, history of stroke on Plavix , atrial flutter, hypothyroidism,generalized anxiety disorder, seasonal and allergic rhinitis, history of dysphagia, history of Schatzki's ring, hiatal hernia, gastritis, GERD, OSA, hard of hearing, osteoarthritis, malignant melanoma of the nasal cavity,coming for palpitation today.  No other complaints of chest pain diaphoresis nausea vomiting diarrhea fevers chills.  No reports of alcohol.  ED MD spoke to cardiology Dr. Ladona .  Patient did have diarrhea 10 days ago states her stool is almost formed now.    Assessment/Plan:   Palpitations -Unclear etiology -Telemonitoring over past 24 hours did not show any significant arrhythmia -Patient says that she had 1 episode of palpitations last night -Seen by cardiology and started on propranolol  10 mg p.o. twice daily as needed -TSH checked to be low at 0.150; will change Synthroid  from 100 mcg daily to 75 mcg daily; as it can cause palpitations -Continue monitoring on telemetry    Prediabetes: A1c 5.8   Abnormal lung auscultation: Patient having rales posteriorly on auscultation chest x-ray is within normal limits.  Troponin is less than 2, will get D-dimer and then proceed with CT chest PE protocol if needed for CT chest with contrast. - D-dimer is negative      Nonanion gap metabolic acidosis: Suspect from GI losses  - Improved     Hypokalemia -Replete  Hypomagnesemia -Magnesium  is 1.5, will replace magnesium  and follow magnesium  level in a.m.    Essential hypertension: Patient is currently on losartan  50 milligram daily -Started on propranolol  10 mg p.o. twice daily as needed     Hypothyroidism: Patient  is on Synthroid  100 mcg. TSH low at 0.150 - Free T41.12 - Will cut down Synthroid  to 75 mcg daily    Medications     clopidogrel   75 mg Oral Daily   fesoterodine   4 mg Oral Daily   heparin   5,000 Units Subcutaneous Q12H   levothyroxine   100 mcg Oral Q0600   losartan   50 mg Oral Daily   pravastatin   40 mg Oral Daily   sodium chloride  flush  3 mL Intravenous Q12H     Data Reviewed:   CBG:  No results for input(s): GLUCAP in the last 168 hours.  SpO2: 98 %    Vitals:   08/11/23 0300 08/11/23 0400 08/11/23 0423 08/11/23 0715  BP:   (Abnormal) 152/63 127/69  Pulse:   70 60  Resp: 19 16 19 18   Temp:   97.6 F (36.4 C) 97.8 F (36.6 C)  TempSrc:   Oral Oral  SpO2:   98% 98%  Weight:   70 kg   Height:          Data Reviewed:  Basic Metabolic Panel: Recent Labs  Lab 08/10/23 1143 08/10/23 1144 08/11/23 0246  NA  --  146* 143  K  --  2.7* 4.3  CL  --  122* 111  CO2  --  17* 22  GLUCOSE  --  84 97  BUN  --  9 11  CREATININE  --  0.47 0.73  CALCIUM   --  6.5* 9.5  MG 1.5*  --   --     CBC: Recent Labs  Lab 08/10/23 1144 08/11/23 0246  WBC  5.8 5.5  NEUTROABS 3.6  --   HGB 13.5 13.8  HCT 41.3 41.2  MCV 91.2 89.4  PLT 218 234    LFT Recent Labs  Lab 08/10/23 1144 08/11/23 0246  AST 13* 19  ALT 12 16  ALKPHOS 39 55  BILITOT 0.6 0.9  PROT 4.2* 6.3*  ALBUMIN 2.4* 3.5     Antibiotics: Anti-infectives (From admission, onward)    None        DVT prophylaxis: Heparin   Code Status: Full code  Family Communication: No family at bedside   CONSULTS cardiology   Subjective   Had 1 episode of palpitations last night.  However telemetry monitoring did not show any abnormal rhythm.   Objective    Physical Examination:   General-appears in no acute distress Heart-S1-S2, regular, no murmur auscultated Lungs-clear to auscultation bilaterally, no wheezing or crackles auscultated Abdomen-soft, nontender, no  organomegaly Extremities-no edema in the lower extremities Neuro-alert, oriented x3, no focal deficit noted  Status is: Inpatient:             Darlene [name]   [facility] If 7PM-7AM, please contact night-coverage at [email or web address], Office  [phone]   08/11/2023, 7:55 AM  LOS: 1 day

## 2023-08-11 NOTE — Care Management Obs Status (Signed)
 MEDICARE OBSERVATION STATUS NOTIFICATION   Patient Details  Name: Darlene [name] MRN: [id number] Date of Birth: January 30, 1933   Medicare Observation Status Notification Given:  Yes    Rosalva Jon Bloch, RN 08/11/2023, 2:24 PM

## 2023-08-12 ENCOUNTER — Ambulatory Visit

## 2023-08-12 ENCOUNTER — Other Ambulatory Visit (HOSPITAL_COMMUNITY): Payer: Self-pay

## 2023-08-12 DIAGNOSIS — R002 Palpitations: Secondary | ICD-10-CM | POA: Diagnosis not present

## 2023-08-12 LAB — MAGNESIUM: Magnesium: 2.4 mg/dL (ref 1.7–2.4)

## 2023-08-12 MED ORDER — LEVOTHYROXINE SODIUM 75 MCG PO TABS
75.0000 ug | ORAL_TABLET | Freq: Every day | ORAL | 0 refills | Status: DC
  Filled 2023-08-12: qty 30, 30d supply, fill #0

## 2023-08-12 MED ORDER — PROPRANOLOL HCL 10 MG PO TABS
10.0000 mg | ORAL_TABLET | Freq: Two times a day (BID) | ORAL | 0 refills | Status: DC | PRN
  Filled 2023-08-12: qty 30, 15d supply, fill #0

## 2023-08-12 NOTE — Discharge Summary (Signed)
 Physician Discharge Summary   Patient: Darlene [name] MRN: [id number] DOB: 1933/10/30  Admit date:     08/10/2023  Discharge date: 08/12/23  Discharge Physician: Reyes VEAR Gaw   PCP: [name], [name], NP   Recommendations at discharge:   Patient instructed to follow-up with urology as previously scheduled for further discussion on bladder issues.   Patient admitted for palpitations.  Started on propranolol  to take as needed, recommended by cardiology.  Also noted to have too high dose of Synthroid  and this was decreased to 75 mcg daily.  She should have TSH rechecked in 6 weeks on this new dose.    Discharge Diagnoses: Principal Problem:   Palpitations  Resolved Problems:   * No resolved hospital problems. *  Hospital Course: 89 y.o. female with past medical history of hypertension, hyperlipidemia, prediabetes, h/o sleep apnea not on CPAP, prior history of stroke in 2006 without residual defect, on Plavix  chronically, esophageal stricture with severe GERD, malignant melanoma of the nasal cavity admitted for persistent palpitations. No other complaints of chest pain diaphoresis nausea vomiting diarrhea fevers chills.  No reports of alcohol.  ED MD spoke to cardiology Dr. Ladona .  She was admitted for the same.  By EKG and telemetry symptoms suggests PACs/PVCs.  She was started on propranolol  10 mg twice needed.  She was also found to be notably hypokalemic secondary to diarrhea and low TSH at 0.15.  Her Synthroid  was decreased to 75 mcg and as she had only 1 further episode of palpitations without abnormal rhythm noted on telemetry during episode, she was discharged home on 08/12/2023.    Assessment/Plan:    Palpitations - Likely multifactorial: Overtreated hypothyroidism, hypokalemia. -Telemonitoring while admitted did not show any significant arrhythmia -Seen by cardiology and started on propranolol  10 mg p.o. twice daily as needed -TSH checked to be low at 0.150; will change Synthroid  from  100 mcg daily to 75 mcg daily; as it can cause palpitations - Will need to have follow-up TSH in 6 weeks.    Prediabetes: A1c 5.8    Nonanion gap metabolic acidosis: Suspect from GI losses  - Resolved prior to discharge    Hypokalemia - Replaced and normal prior to discharge   Hypomagnesemia - Replaced and normal prior to discharge    Essential hypertension: Patient is currently on losartan  50 milligram daily -Started on propranolol  10 mg p.o. twice daily as needed, discharged home on the same.   Hypothyroidism: Patient is on Synthroid  100 mcg. TSH low at 0.150 - Free T41.12  Urgent continence: - Evidently long-term issue with patient. - She was fairly perseverative about this prior to discharge.  She has follow-up scheduled next Friday with Dr. Marykay of [facility] to further discuss      Consultants: Cardiology Procedures performed: None Disposition: Home Diet recommendation:  Discharge Diet Orders (From admission, onward)     Start     Ordered   08/12/23 0000  Diet - low sodium heart healthy        08/12/23 1420           Heart healthy diet DISCHARGE MEDICATION: Allergies as of 08/12/2023       Reactions   Lactose Intolerance (gi) Other (See Comments)   indigestion   Norco [hydrocodone-acetaminophen ] Nausea And Vomiting   Dust Mite Extract Other (See Comments)   Sinus drainage   Oxycontin [oxycodone Hcl] Nausea And Vomiting   Pollen Extract Other (See Comments)   Sinus drainage  Medication List     TAKE these medications    ammonium lactate 12 % cream Commonly known as: AMLACTIN Apply 1 Application topically as needed for dry skin.   azelastine  0.1 % nasal spray Commonly known as: ASTELIN  Place 2 sprays into both nostrils in the morning and at bedtime.   clopidogrel  75 MG tablet Commonly known as: PLAVIX  TAKE 1 TABLET BY MOUTH EVERY DAY   clotrimazole-betamethasone  cream Commonly known as: LOTRISONE Apply 1  Application topically daily.   ketoconazole  2 % shampoo Commonly known as: NIZORAL  APPLY TOPICALLY 2 TIMES A WEEK What changed: See the new instructions.   levothyroxine  75 MCG tablet Commonly known as: SYNTHROID  Take 1 tablet (75 mcg total) by mouth daily at 6 (six) AM. Start taking on: August 13, 2023 What changed:  medication strength See the new instructions.   losartan  50 MG tablet Commonly known as: COZAAR  Take 1 tablet (50 mg total) by mouth daily.   nitroGLYCERIN  0.4 MG SL tablet Commonly known as: NITROSTAT  Place 1 tablet (0.4 mg total) under the tongue every 5 (five) minutes as needed for up to 25 days for chest pain.   nystatin  cream Commonly known as: MYCOSTATIN  Apply 1 Application topically 2 (two) times daily. What changed: when to take this   omeprazole  40 MG capsule Commonly known as: PRILOSEC Take 1 capsule (40 mg total) by mouth 2 (two) times daily before a meal.   oxymetazoline  0.05 % nasal spray Commonly known as: Afrin Nasal Spray Place 1 spray into both nostrils 2 (two) times daily. What changed:  when to take this reasons to take this   pantoprazole  40 MG tablet Commonly known as: PROTONIX  Take 40 mg by mouth daily.   pravastatin  40 MG tablet Commonly known as: PRAVACHOL  Take 1 tablet (40 mg total) by mouth daily.   PRESCRIPTION MEDICATION Take 2-4 mg by mouth every 6 (six) hours as needed (dry mouth). Compounded Pilocarpine 4 mg/ml   propranolol  10 MG tablet Commonly known as: INDERAL  Take 1 tablet (10 mg total) by mouth 2 (two) times daily as needed (Palpitations).   tolterodine 4 MG 24 hr capsule Commonly known as: DETROL LA Take 4 mg by mouth daily.   Vitamin D (Ergocalciferol) 1.25 MG (50000 UNIT) Caps capsule Commonly known as: DRISDOL TAKE 1 CAPSULE BY MOUTH ONE TIME PER WEEK What changed: See the new instructions.   Zolpidem  Tartrate 3.5 MG Subl TAKE 1 TABLET BY MOUTH EVERY DAY AT BEDTIME AS NEEDED What changed:  how much  to take how to take this when to take this reasons to take this additional instructions        Follow-up Information     [name], [name], NP Follow up.   Specialty: Internal Medicine Contact information: [address] [place] [address] [phone]                Discharge Exam: Darlene [name]   08/10/23 1906 08/11/23 0423 08/12/23 0337  Weight: 71.4 kg 70 kg 69 kg   Gen:  Alert, cooperative patient who appears stated age in no acute distress.  Vital signs reviewed. Cardiac:  Regular rate and rhythm without murmur auscultated.  Good S1/S2. Pulm:  Clear to auscultation bilaterally with good air movement.  No wheezes or rales noted.   Abd:  S/ND/NT Ext: Thin without lower extremity edema Neuro: Alert and oriented x 3 without any focal neurological deficits Psych: Calm and appropriate affect and fluent speech  Condition at discharge: Good  The results of significant diagnostics from this hospitalization (including imaging, microbiology, ancillary and laboratory) are listed below for reference.   Imaging Studies: DG Chest Portable 1 View Result Date: 08/10/2023 CLINICAL DATA:  Weakness. EXAM: PORTABLE CHEST 1 VIEW COMPARISON:  05/06/2023. FINDINGS: The heart size and mediastinal contours are within normal limits. Aortic atherosclerosis. No focal consolidation, pleural effusion, or pneumothorax. No acute osseous abnormality. IMPRESSION: No acute cardiopulmonary findings. Electronically Signed   By: Harrietta Sherry M.D.   On: 08/10/2023 11:30    Microbiology: Results for orders placed or performed during the hospital encounter of 05/06/23  Resp panel by RT-PCR (RSV, Flu A&B, Covid) Anterior Nasal Swab     Status: None   Collection Time: 05/06/23 10:11 AM   Specimen: Anterior Nasal Swab  Result Value Ref Range Status   SARS Coronavirus 2 by RT PCR NEGATIVE NEGATIVE Final    Comment: (NOTE) SARS-CoV-2 target nucleic acids are NOT DETECTED.  The SARS-CoV-2 RNA is  generally detectable in upper respiratory specimens during the acute phase of infection. The lowest concentration of SARS-CoV-2 viral copies this assay can detect is 138 copies/mL. A negative result does not preclude SARS-Cov-2 infection and should not be used as the sole basis for treatment or other patient management decisions. A negative result may occur with  improper specimen collection/handling, submission of specimen other than nasopharyngeal swab, presence of viral mutation(s) within the areas targeted by this assay, and inadequate number of viral copies(<138 copies/mL). A negative result must be combined with clinical observations, patient history, and epidemiological information. The expected result is Negative.  Fact Sheet for Patients:  BloggerCourse.com  Fact Sheet for Healthcare Providers:  SeriousBroker.it  This test is no t yet approved or cleared by the [place]  FDA and  has been authorized for detection and/or diagnosis of SARS-CoV-2 by FDA under an Emergency Use Authorization (EUA). This EUA will remain  in effect (meaning this test can be used) for the duration of the COVID-19 declaration under Section 564(b)(1) of the Act, 21 U.S.C.section 360bbb-3(b)(1), unless the authorization is terminated  or revoked sooner.       Influenza A by PCR NEGATIVE NEGATIVE Final   Influenza B by PCR NEGATIVE NEGATIVE Final    Comment: (NOTE) The Xpert Xpress SARS-CoV-2/FLU/RSV plus assay is intended as an aid in the diagnosis of influenza from Nasopharyngeal swab specimens and should not be used as a sole basis for treatment. Nasal washings and aspirates are unacceptable for Xpert Xpress SARS-CoV-2/FLU/RSV testing.  Fact Sheet for Patients: BloggerCourse.com  Fact Sheet for Healthcare Providers: SeriousBroker.it  This test is not yet approved or cleared by the [place] FDA and has been authorized for detection and/or diagnosis of SARS-CoV-2 by FDA under an Emergency Use Authorization (EUA). This EUA will remain in effect (meaning this test can be used) for the duration of the COVID-19 declaration under Section 564(b)(1) of the Act, 21 U.S.C. section 360bbb-3(b)(1), unless the authorization is terminated or revoked.     Resp Syncytial Virus by PCR NEGATIVE NEGATIVE Final    Comment: (NOTE) Fact Sheet for Patients: BloggerCourse.com  Fact Sheet for Healthcare Providers: SeriousBroker.it  This test is not yet approved or cleared by the [place]  FDA and has been authorized for detection and/or diagnosis of SARS-CoV-2 by FDA under an Emergency Use Authorization (EUA). This EUA will remain in effect (meaning this test can be used) for the duration of the COVID-19 declaration under Section 564(b)(1) of the Act, 21 U.S.C. section 360bbb-3(b)(1),  unless the authorization is terminated or revoked.  Performed at [facility], [address] [place], [place] [address]     Labs: CBC: Recent Labs  Lab 08/10/23 1144 08/11/23 0246  WBC 5.8 5.5  NEUTROABS 3.6  --   HGB 13.5 13.8  HCT 41.3 41.2  MCV 91.2 89.4  PLT 218 234   Basic Metabolic Panel: Recent Labs  Lab 08/10/23 1143 08/10/23 1144 08/11/23 0246 08/12/23 0254  NA  --  146* 143  --   K  --  2.7* 4.3  --   CL  --  122* 111  --   CO2  --  17* 22  --   GLUCOSE  --  84 97  --   BUN  --  9 11  --   CREATININE  --  0.47 0.73  --   CALCIUM   --  6.5* 9.5  --   MG 1.5*  --   --  2.4   Liver Function Tests: Recent Labs  Lab 08/10/23 1144 08/11/23 0246  AST 13* 19  ALT 12 16  ALKPHOS 39 55  BILITOT 0.6 0.9  PROT 4.2* 6.3*  ALBUMIN 2.4* 3.5   CBG: No results for input(s): GLUCAP in the last 168 hours.  Discharge time spent: Less than 30 minutes.  Signed: Reyes VEAR Gaw, MD [facility] 08/12/2023

## 2023-08-12 NOTE — Progress Notes (Signed)
 Reviewed AVS, patient expressed understanding of medications, MD follow up reviewed.   Patient states all belongings brought to the hospital at time of admission are accounted for and packed to take home.

## 2023-08-12 NOTE — TOC Progression Note (Signed)
 Transition of Care [facility]) - Progression Note    Patient Details  Name: Darlene [name] MRN: [id number] Date of Birth: 1933-04-18  [facility]) CM/SW Contact  Luise JAYSON Pan, [place] Phone Number: 08/12/2023, 10:40 AM  Clinical Narrative:   CSW spoke with Izetta at [facility] and informed that patient does not need PT/OT eval at this time. Patient can DC home once medically stable.   TOC will continue to follow.    Expected Discharge Plan: Home/Self Care (From [facility]) Barriers to Discharge: Continued Medical Work up               Expected Discharge Plan and Services In-house Referral: Clinical Social Work     Living arrangements for the past 2 months: Independent Press photographer                                       Social Drivers of Health (SDOH) Interventions SDOH Screenings   Food Insecurity: No Food Insecurity (08/10/2023)  Housing: Low Risk  (08/10/2023)  Transportation Needs: No Transportation Needs (08/10/2023)  Utilities: Not At Risk (08/10/2023)  Depression (PHQ2-9): Low Risk  (05/19/2023)  Social Connections: Unknown (08/10/2023)  Tobacco Use: Low Risk  (08/10/2023)    Readmission Risk Interventions    06/08/2021   10:59 AM  Readmission Risk Prevention Plan  Transportation Screening Complete  PCP or Specialist Appt within 5-7 Days Complete  Home Care Screening Complete  Medication Review (RN CM) Complete

## 2023-08-15 ENCOUNTER — Other Ambulatory Visit: Payer: Self-pay | Admitting: Nurse Practitioner

## 2023-08-15 DIAGNOSIS — F411 Generalized anxiety disorder: Secondary | ICD-10-CM

## 2023-08-15 DIAGNOSIS — K219 Gastro-esophageal reflux disease without esophagitis: Secondary | ICD-10-CM

## 2023-08-15 MED ORDER — ZOLPIDEM TARTRATE 3.5 MG SL SUBL: SUBLINGUAL_TABLET | SUBLINGUAL | 1 refills | Status: DC

## 2023-08-15 NOTE — Telephone Encounter (Signed)
 High risk warning

## 2023-08-15 NOTE — Telephone Encounter (Signed)
 Copied from CRM [id number]. Topic: Clinical - Medication Refill >> Aug 15, 2023  9:28 AM Carrielelia G wrote: Medication: Zolpidem  Tartrate 3.5 MG SUBL    Has the patient contacted their pharmacy? Yes (Agent: If no, request that the patient contact the pharmacy for the refill. If patient does not wish to contact the pharmacy document the reason why and proceed with request.) (Agent: If yes, when and what did the pharmacy advise?)  This is the patient's preferred pharmacy:  [id number] [address] [facility] - [address] [place] [address] Phone: [phone] Fax: [phone]  Is this the correct pharmacy for this prescription? Yes If no, delete pharmacy and type the correct one.     Is the patient out of the medication? No  (two days left)   Has the patient been seen for an appointment in the last year OR does the patient have an upcoming appointment? Yes  Can we respond through MyChart? No  Agent: Please be advised that Rx refills may take up to 3 business days. We ask that you follow-up with your pharmacy.

## 2023-08-17 ENCOUNTER — Other Ambulatory Visit: Payer: Self-pay | Admitting: Nurse Practitioner

## 2023-08-17 DIAGNOSIS — K219 Gastro-esophageal reflux disease without esophagitis: Secondary | ICD-10-CM

## 2023-08-17 DIAGNOSIS — F411 Generalized anxiety disorder: Secondary | ICD-10-CM

## 2023-08-18 NOTE — Telephone Encounter (Signed)
 Medication was signed 1 day ago , controlled substance.

## 2023-08-19 ENCOUNTER — Encounter: Payer: Self-pay | Admitting: Nurse Practitioner

## 2023-08-19 ENCOUNTER — Telehealth: Payer: Self-pay | Admitting: Cardiology

## 2023-08-19 ENCOUNTER — Telehealth: Payer: Self-pay

## 2023-08-19 MED ORDER — PROPRANOLOL HCL 20 MG PO TABS: ORAL_TABLET | ORAL | 0 refills | Status: DC

## 2023-08-19 NOTE — Telephone Encounter (Signed)
 Spoke to patient Dr.Ganji's advice given.She will take Propranolol  20 mg three times a day as needed for palpitations.Advised PCP to refill.

## 2023-08-19 NOTE — Telephone Encounter (Signed)
 Spoke with pt who reports she continues to have issues with palpitations.  She has taken Propranolol  every 2-3 hours at times.  She is aware Propranolol  has been prescribed twice daily as needed.  Especially during the night and early morning.  Pt was admitted to the hospital on 7/23-7/25 for palpitations.  She does not have follow up scheduled at this time.  Offered pt an appointment today with Katlyn West, NP and she declines stating she has an appointment for a facial.  Pt's current BP 134/78 with Hr - 73. Denies current CP, SOB or dizziness.  Pt advised will forward to Dr Ladona and his RN for further review and recommendation.  Pt verbalizes understanding and thanked Charity fundraiser for the call.

## 2023-08-19 NOTE — Telephone Encounter (Signed)
 Patient returned RN's call.

## 2023-08-19 NOTE — Telephone Encounter (Signed)
 I have called and spoken to pharmacy. They stated that they needed verbal order for medication Zolpidem  because it somehow got deleted out of their system. I gave verbal order for medication along with my name, office address, and provider DEA as confirmation. Nothing else follows at this time.

## 2023-08-19 NOTE — Telephone Encounter (Signed)
 Patient c/o Palpitations:  STAT if patient reporting lightheadedness, shortness of breath, or chest pain  How long have you had palpitations/irregular HR/ Afib? Are you having the symptoms now?   Yes  Are you currently experiencing lightheadedness, SOB or CP?   No  Do you have a history of afib (atrial fibrillation) or irregular heart rhythm?   Yes  Have you checked your BP or HR? (document readings if available):  Not available  Are you experiencing any other symptoms?   Fatigue - due to lack of sleep   Patient stated she has incontinence issue gets up several times a night and has been having to take medication (propranolol  (INDERAL ) 10 MG tablet) for palpitations more often, ever 2-3 hours.  Patient stated she will not be available between 11:00 am and 1:00 pm today.

## 2023-08-19 NOTE — Telephone Encounter (Signed)
 Copied from CRM [id number]. Topic: General - Other >> Aug 18, 2023  4:49 PM Adrianna P wrote: Reason for CRM: [facility] to request verbal. Please call [phone]

## 2023-08-19 NOTE — Telephone Encounter (Signed)
 Called patient left message on personal voice mail to call back.

## 2023-08-19 NOTE — Telephone Encounter (Signed)
 Please send Rx of Propranolol  20 mg TID and 1 tab PRN for palpitations. 90 days with future refills with PCP. Looks like the medication has helped her with anxiety and palpitations. To see me PRN

## 2023-08-30 ENCOUNTER — Telehealth: Payer: Self-pay | Admitting: Cardiology

## 2023-08-30 NOTE — Telephone Encounter (Signed)
 Patient c/o Palpitations: STAT if patient c/o lightheadedness, shortness of breath, or chest pain  How long have you had palpitations/irregular HR/ Afib? Are you having the symptoms now? Pt has been having palpitations for several weeks.   Are you currently experiencing lightheadedness, SOB or CP? No  Do you have a history of afib (atrial fibrillation) or irregular heart rhythm? Yes  Have you checked your BP or HR? (document readings if available): No  Are you experiencing any other symptoms? No

## 2023-08-30 NOTE — Telephone Encounter (Signed)
 Yes it is safe to do so

## 2023-08-30 NOTE — Telephone Encounter (Signed)
 I spoke with patient and gave her message from Dr Ladona.  She reports levothyroxine  dose has been decreased but she continues to have palpitations.  Mostly during the night when she wakes up to go to the bathroom.  Was recently in the hospital with palpitations.  She is requesting appointment this week.  Appointment made for patient to see Dr Ladona on August 14 at 10 AM

## 2023-08-30 NOTE — Telephone Encounter (Signed)
 Left message to call office.

## 2023-08-30 NOTE — Telephone Encounter (Signed)
 Spoke with pt who is concerned that she is still feeling frequent palpitation despite the increase dose of 20 mg up to TID prn.   She states she mostly notices them at night of when she is still.  Does not notice them with activity.  The most she has had to take the propranolol  has been twice a day.  Advised to avoid any stimulants and try taking propranolol  before bed to help with night time palpitations.  She is also concerned because she takes Zolpidem  tartrate most night for sleep but wants to make sure it is OK to take both the sleep medication as well as propranolol  at bedtime.  She also notes her urologist is going to be trying something new to help with her nocturia.  Levothyroxine  dose was recently decrease per her report as we.  Advised  I will forward this information to Dr Ladona for his review and she will be contacted with further instructions/recommendations. BP this morning @ 8:45 am was 147/89 and HR 52.

## 2023-09-01 ENCOUNTER — Other Ambulatory Visit: Payer: Self-pay

## 2023-09-01 ENCOUNTER — Emergency Department (HOSPITAL_COMMUNITY)

## 2023-09-01 ENCOUNTER — Emergency Department (HOSPITAL_COMMUNITY)
Admission: EM | Admit: 2023-09-01 | Discharge: 2023-09-01 | Disposition: A | Discharge status: Home / Self Care | Source: Ambulatory Visit | Attending: Emergency Medicine | Admitting: Emergency Medicine

## 2023-09-01 ENCOUNTER — Other Ambulatory Visit (HOSPITAL_COMMUNITY): Payer: Self-pay

## 2023-09-01 ENCOUNTER — Ambulatory Visit: Attending: Cardiology | Admitting: Cardiology

## 2023-09-01 ENCOUNTER — Encounter: Payer: Self-pay | Admitting: Cardiology

## 2023-09-01 VITALS — BP 80/40 | HR 64 | Resp 16 | Ht 63.0 in | Wt 156.7 lb

## 2023-09-01 DIAGNOSIS — R002 Palpitations: Secondary | ICD-10-CM

## 2023-09-01 DIAGNOSIS — F411 Generalized anxiety disorder: Secondary | ICD-10-CM | POA: Diagnosis not present

## 2023-09-01 DIAGNOSIS — Z85828 Personal history of other malignant neoplasm of skin: Secondary | ICD-10-CM | POA: Insufficient documentation

## 2023-09-01 DIAGNOSIS — I10 Essential (primary) hypertension: Secondary | ICD-10-CM | POA: Diagnosis not present

## 2023-09-01 DIAGNOSIS — Z7902 Long term (current) use of antithrombotics/antiplatelets: Secondary | ICD-10-CM | POA: Diagnosis not present

## 2023-09-01 DIAGNOSIS — E039 Hypothyroidism, unspecified: Secondary | ICD-10-CM | POA: Insufficient documentation

## 2023-09-01 DIAGNOSIS — Z79899 Other long term (current) drug therapy: Secondary | ICD-10-CM | POA: Insufficient documentation

## 2023-09-01 DIAGNOSIS — Z79890 Hormone replacement therapy (postmenopausal): Secondary | ICD-10-CM | POA: Diagnosis not present

## 2023-09-01 DIAGNOSIS — Z8673 Personal history of transient ischemic attack (TIA), and cerebral infarction without residual deficits: Secondary | ICD-10-CM | POA: Diagnosis not present

## 2023-09-01 DIAGNOSIS — I201 Angina pectoris with documented spasm: Secondary | ICD-10-CM

## 2023-09-01 DIAGNOSIS — R072 Precordial pain: Secondary | ICD-10-CM | POA: Insufficient documentation

## 2023-09-01 DIAGNOSIS — R079 Chest pain, unspecified: Secondary | ICD-10-CM | POA: Diagnosis present

## 2023-09-01 LAB — BASIC METABOLIC PANEL WITH GFR
Anion gap: 9 (ref 5–15)
BUN: 18 mg/dL (ref 8–23)
CO2: 23 mmol/L (ref 22–32)
Calcium: 9.4 mg/dL (ref 8.9–10.3)
Chloride: 109 mmol/L (ref 98–111)
Creatinine, Ser: 0.76 mg/dL (ref 0.44–1.00)
GFR, Estimated: >60 mL/min (ref >60)
Glucose, Bld: 134 mg/dL — ABNORMAL HIGH (ref 70–99)
Potassium: 4.3 mmol/L (ref 3.5–5.1)
Sodium: 141 mmol/L (ref 135–145)

## 2023-09-01 LAB — CBC
HCT: 41.6 % (ref 36.0–46.0)
Hemoglobin: 13.5 g/dL (ref 12.0–15.0)
MCH: 30.1 pg (ref 26.0–34.0)
MCHC: 32.5 g/dL (ref 30.0–36.0)
MCV: 92.7 fL (ref 80.0–100.0)
Platelets: 243 K/uL (ref 150–400)
RBC: 4.49 MIL/uL (ref 3.87–5.11)
RDW: 12.8 % (ref 11.5–15.5)
WBC: 7.9 K/uL (ref 4.0–10.5)
nRBC: 0 % (ref 0.0–0.2)

## 2023-09-01 LAB — TROPONIN I (HIGH SENSITIVITY)
Troponin I (High Sensitivity): 2 ng/L (ref <18)
Troponin I (High Sensitivity): 3 ng/L (ref <18)

## 2023-09-01 MED ORDER — ALUM & MAG HYDROXIDE-SIMETH 200-200-20 MG/5ML PO SUSP
30.0000 mL | Freq: Once | ORAL | Status: AC
Start: 2023-09-01 — End: 2023-09-01
  Administered 2023-09-01: 30 mL via ORAL
  Filled 2023-09-01: qty 30

## 2023-09-01 MED ORDER — PROPRANOLOL HCL ER 80 MG PO CP24
80.0000 mg | ORAL_CAPSULE | Freq: Every day | ORAL | 3 refills | Status: DC
  Filled 2023-09-01: qty 90, 90d supply, fill #0

## 2023-09-01 MED ORDER — SODIUM CHLORIDE 0.9 % IV BOLUS
500.0000 mL | Freq: Once | INTRAVENOUS | Status: AC
  Administered 2023-09-01: 500 mL via INTRAVENOUS

## 2023-09-01 NOTE — Progress Notes (Signed)
 Cardiology Office Note:  .   Date:  09/01/2023  ID:  Darlene [name], DOB 05/30/1933, MRN [id number] PCP: [name], [name], NP  [facility] Providers Cardiologist:  Gordy Bergamo, MD   History of Present Illness: .   Darlene [name] is a 89 y.o. Caucasian female with hypertension, hyperlipidemia, prediabetes, h/o sleep apnea not on CPAP, prior history of stroke in 2006 without residual defect, on Plavix  chronically, esophageal stricture with severe GERD and also postnasal drip. She has had frequent palpitations, chronic chest pain both suggestive of musculoskeletal pain and also Prinzmetal's angina, difficulty in controlling hypertension, extreme anxiety, multiple medication intolerances.  She has had a normal coronary CTA on 09/01/2020 with coronary calcium  score of 0.  She had presented to the emergency room on 08/10/2023 with palpitations and not feeling well and chest pain that is chronic, I recommended starting her on propranolol  for palpitations which is clearly related to PACs and PVCs.  This would help with both anxiety and palpitations and hypertension.  She has been tolerating this.  However she was also admitted to the hospital during my evaluation in the ED due to severe hypokalemia secondary to diarrhea and low TSH and dehydration.  She was discharged 2 days later.  Cardiac Studies relevent.    DG Chest Portable 1 View 08/10/2023 PORTABLE CHEST 1 VIEW COMPARISON:  05/06/2023. FINDINGS: The heart size and mediastinal contours are within normal limits. Aortic atherosclerosis. No focal consolidation, pleural effusion, or pneumothorax. No acute osseous abnormality.  Exercise nuclear stress test 04/14/2020: Patient exercised for 5 minutes and achieved 7.03 METS.  Normal myocardial perfusion.  LVEF 63%.  Echocardiogram 03/20/2020: Normal LV systolic function, EF 60 to 65%.  Mild tricuspid regurgitation.  No change from 04/24/2019.  Coronary CTA 09/01/2020: Coronary calcium  score of 0, no  coronary artery disease.  Evaluate for noncardiac causes of chest pain.  Remote outpatient cardiac telemetry 04/24/2019 through 05/08/2019: Predominant rhythm is normal sinus rhythm.  Minimum heart rate 49, average heart rate 72 and maximum heart rate 195 bpm. PVC burden <1%.  There were 0 ventricular couplets and 0 ventricular runs. PAC burden <1%.  There was 1 supraventricular run of 5 beats.  No atrial fibrillation.  No heart block. 12 patient activated events correlated with sinus rhythm    Discussed the use of AI scribe software for clinical note transcription with the patient, who gave verbal consent to proceed.  History of Present Illness Darlene [name] is an 89 year old female with a history of stroke who presents with palpitations and concerns about heart-related symptoms.  She experiences recurrent palpitations and intermittent chest pain radiating to her jaw, often occurring at night or early morning. Propranolol  provides partial relief. Her history of stroke and current use of Plavix , cholesterol, and blood pressure medications increase her concern about heart health. A coronary CT angiogram two years ago showed a coronary calcium  score of zero. Her propranolol  dosage was recently increased without side effects, and she has an adequate supply.   Labs   Lab Results  Component Value Date   CHOL 163 05/12/2021   HDL 44 05/12/2021   LDLCALC 94 05/12/2021   TRIG 144 05/12/2021   CHOLHDL 3.5 12/26/2019   No results found for: LIPOA  Recent Labs    08/10/23 1144 08/11/23 0246 09/01/23 1137  NA 146* 143 141  K 2.7* 4.3 4.3  CL 122* 111 109  CO2 17* 22 23  GLUCOSE 84 97 134*  BUN 9  11 18  CREATININE 0.47 0.73 0.76  CALCIUM  6.5* 9.5 9.4  GFRNONAA >60 >60 >60    Lab Results  Component Value Date   ALT 16 08/11/2023   AST 19 08/11/2023   ALKPHOS 55 08/11/2023   BILITOT 0.9 08/11/2023      Latest Ref Rng & Units 09/01/2023   11:37 AM 08/11/2023    2:46 AM  08/10/2023   11:44 AM  CBC  WBC 4.0 - 10.5 K/uL 7.9  5.5  5.8   Hemoglobin 12.0 - 15.0 g/dL 86.4  86.1  86.4   Hematocrit 36.0 - 46.0 % 41.6  41.2  41.3   Platelets 150 - 400 K/uL 243  234  218    Lab Results  Component Value Date   HGBA1C 5.8 (H) 08/10/2023    Lab Results  Component Value Date   TSH 0.150 (L) 08/10/2023    Cardiac Panel (last 3 results) Recent Labs    09/01/23 1137 09/01/23 1337  TROPONINIHS 2 3    ROS  Review of Systems  Cardiovascular:  Negative for chest pain, dyspnea on exertion and leg swelling.   Physical Exam:   VS:  BP (!) 80/40 (BP Location: Left Arm, Cuff Size: Normal)   Pulse 64   Resp 16   Ht 5' 3 (1.6 m)   Wt 156 lb 11.2 oz (71.1 kg)   SpO2 95%   BMI 27.76 kg/m    Wt Readings from Last 3 Encounters:  09/01/23 156 lb 11.2 oz (71.1 kg)  09/01/23 156 lb 11.2 oz (71.1 kg)  08/12/23 152 lb 1.9 oz (69 kg)    BP Readings from Last 3 Encounters:  09/01/23 (!) 146/81  09/01/23 (!) 80/40  08/12/23 138/64   Physical Exam Neck:     Vascular: No carotid bruit or JVD.  Cardiovascular:     Rate and Rhythm: Normal rate and regular rhythm.     Pulses: Intact distal pulses.     Heart sounds: Normal heart sounds. No murmur heard.    No gallop.  Pulmonary:     Effort: Pulmonary effort is normal.     Breath sounds: Normal breath sounds.  Abdominal:     General: Bowel sounds are normal.     Palpations: Abdomen is soft.  Musculoskeletal:     Right lower leg: No edema.     Left lower leg: No edema.    EKG:    EKG Interpretation Date/Time:  Thursday September 01 2023 10:47:43 EDT Ventricular Rate:  46 PR Interval:  188 QRS Duration:  70 QT Interval:  414 QTC Calculation: 362 R Axis:   62  Text Interpretation: EKG 09/01/2023: Sinus bradycardia at rate of 46 bpm no significant change from prior EKG performed earlier today and patient was not having any chest pain. Confirmed by Roshard Rezabek, Jagadeesh [phone]) on 09/01/2023 8:55:01 PM  EKG 08/10/2023:  Normal sinus rhythm with rate of 57 bpm, normal axis, no evidence of ischemia, normal EKG.   ASSESSMENT AND PLAN: .      ICD-10-CM   1. Palpitation  R00.2 EKG 12-Lead    2. Primary hypertension  I10 EKG 12-Lead    propranolol  ER (INDERAL  LA) 80 MG 24 hr capsule    EKG 12-Lead    3. Prinzmetal's angina (HCC)  I20.1 EKG 12-Lead    4. GAD (generalized anxiety disorder)  F41.1 propranolol  ER (INDERAL  LA) 80 MG 24 hr capsule     Assessment & Plan Palpitations and angina Intermittent palpitations and  angina with chest pain radiating to the jaw. Symptoms are not consistently controlled with current propranolol  regimen. EKG and heart rate are normal. Coronary CT angiogram from two years ago showed a coronary calcium  score of zero, indicating no plaque buildup. Propranolol  is effective but not long-lasting. - Prescribe propranolol  XL 80 mg to be taken once daily in the evening. - Allow use of propranolol  20 mg as needed for breakthrough symptoms. - Ensure no interaction with zolpidem  tartrate. - Advise that the medication will not interfere with planned surgical or dental procedures.  History of stroke Stroke 17 years ago. Currently on Plavix , blood thinner, cholesterol medication, and blood pressure medication for secondary prevention.  Hypertension Hypertension is being managed with current medication regimen.  After had seen the patient in discharger from the office, patient started having chest discomfort with radiation to her neck and arms similar to her Prinzmetal's angina, I reevaluated her and advised her neuropathy and symptoms, to COVID and take her nitroglycerin .  Patient became hypotensive and diaphoretic post nitroglycerin  needing evaluation at the bedside.  Patient was placed on supine position, EMS was activated, EKG repeated and fortunately EKG does not reveal any ischemic changes.  After 20 minutes of observation, patient's blood pressure improved which had dropped to around  80/60 mmHg post nitroglycerin , chest pain had essentially resolved.  However in view of her advanced age, patient preference, she was transported to the emergency room for further evaluation.  I did receive a call from the ED, no change in the EKG, labs stable, troponins negative, patient is chest pain-free and remains asymptomatic.  Advised them to give her IV fluids and discharged home.  I will continue to follow her as needed and also back in 6 months.   Follow up: 6 months  Signed,  Gordy Bergamo, MD, [facility] 09/01/2023, 8:58 PM [facility] [address] [place], [place] [address] Phone: [phone]. Fax:  [phone]

## 2023-09-01 NOTE — ED Notes (Signed)
 Patient discharged by RN. Patient verbalizes understanding of instructions without additional questions. In wheelchair to car.

## 2023-09-01 NOTE — Discharge Instructions (Addendum)
 You were seen in the emergency department today for chest pain.  As we discussed your lab work, EKG, chest x-ray all looked reassuring today.  We did discuss your symptoms with your cardiologist Dr. Ladona who felt like you should be able to go home.  I recommend monitoring your stress levels.  Continue to monitor how you are doing overall, and return to the emergency department for any new or worsening symptoms such as: Worsening pain or pain with exertion, difficulty breathing, sweating, or pain or swelling in your legs.  Please call your PCP as well as your cardiologist to schedule a close follow-up appointment.  Return if development of any new or worsening symptoms.

## 2023-09-01 NOTE — Patient Instructions (Signed)
 Medication Instructions:  Your physician has recommended you make the following change in your medication: Start Inderal  LA 80 mg by mouth every evening Change propranolol  to 20 mg by mouth three times daily as needed for palpitations   *If you need a refill on your cardiac medications before your next appointment, please call your pharmacy*  Lab Work: none If you have labs (blood work) drawn today and your tests are completely normal, you will receive your results only by: MyChart Message (if you have MyChart) OR A paper copy in the mail If you have any lab test that is abnormal or we need to change your treatment, we will call you to review the results.  Testing/Procedures: none  Follow-Up: At [facility], you and your health needs are our priority.  As part of our continuing mission to provide you with exceptional heart care, our providers are all part of one team.  This team includes your primary Cardiologist (physician) and Advanced Practice Providers or APPs (Physician Assistants and Nurse Practitioners) who all work together to provide you with the care you need, when you need it.  Your next appointment:   6 month(s)  Provider:   Gordy Bergamo, MD    We recommend signing up for the patient portal called MyChart.  Sign up information is provided on this After Visit Summary.  MyChart is used to connect with patients for Virtual Visits (Telemedicine).  Patients are able to view lab/test results, encounter notes, upcoming appointments, etc.  Non-urgent messages can be sent to your provider as well.   To learn more about what you can do with MyChart, go to [email or web address].   Other Instructions

## 2023-09-01 NOTE — ED Triage Notes (Signed)
 Patient arrives via [facility] from heart and vascular seeing provider when she experienced chest pain. Patient endorses nausea that has subsided during transport. Patient was getting ready to be discharged and chest pain began with right arm and neck pain. 1 nitroglycerin  given. Patient's BP 90 systolic, per facility HR 40s. EMS HR 60s. CBG 132. Last BP 98/60. Chronic neck and arm pain. 324 ASA given by EMS.

## 2023-09-01 NOTE — ED Provider Notes (Signed)
 [place] EMERGENCY DEPARTMENT AT [facility] Provider Note   CSN: [id number] Arrival date & time: 09/01/23  1122     Patient presents with: Chest Pain   Darlene [name] is a 89 y.o. female.   Patient with history of hypertension, hyperlipidemia presents today with complaints of chest pain.  She reports that she was at her appointment seeing her cardiologist Dr. Ladona this morning when she developed chest pain.  Reports she was getting ready to go home after her visit when this occurred.  Pain is left-sided in nature.  Reports she had associated neck and arm pain, however reports she does have chronic pain in these areas.  Reports that providers at the office gave her nitroglycerin  which made her feel nauseous and generally unwell.  She was also given 324 of aspirin.  During transport here her symptoms spontaneously resolved and she feels back to normal.  She does endorse some mild burning sensation in her abdomen at this time but feels like reflux she has had previously.  Reports she did not take her reflux medication this morning. Denies ever feeling short of breath. No fevers, chills, cough, or congestion. No leg pain or leg swelling.    The history is provided by the patient. No language interpreter was used.  Chest Pain      Prior to Admission medications   Medication Sig Start Date End Date Taking? Authorizing Provider  ammonium lactate (AMLACTIN) 12 % cream Apply 1 Application topically as needed for dry skin. 04/14/23   [provider]  azelastine  (ASTELIN ) 0.1 % nasal spray Place 2 sprays into both nostrils in the morning and at bedtime.    [provider]  clopidogrel  (PLAVIX ) 75 MG tablet TAKE 1 TABLET BY MOUTH EVERY DAY 06/29/23   [name], [name], NP  clotrimazole-betamethasone  (LOTRISONE) cream Apply 1 Application topically daily.    [provider]  ketoconazole  (NIZORAL ) 2 % shampoo APPLY TOPICALLY 2 TIMES A WEEK Patient taking  differently: Apply 1 Application topically 2 (two) times a week. 02/28/23   [name], [name], NP  levothyroxine  (SYNTHROID ) 75 MCG tablet Take 1 tablet (75 mcg total) by mouth daily at 6 (six) AM. 08/13/23   Elpidio Reyes DEL, MD  losartan  (COZAAR ) 50 MG tablet Take 1 tablet (50 mg total) by mouth daily. 12/21/22 12/10/24  [name], [name], NP  nitroGLYCERIN  (NITROSTAT ) 0.4 MG SL tablet Place 1 tablet (0.4 mg total) under the tongue every 5 (five) minutes as needed for up to 25 days for chest pain. 12/21/21 01/19/48  Osa Grate, NP  nystatin  cream (MYCOSTATIN ) Apply 1 Application topically 2 (two) times daily. Patient taking differently: Apply 1 Application topically daily. 05/25/23   [name], [name], NP  oxymetazoline  (AFRIN NASAL SPRAY) 0.05 % nasal spray Place 1 spray into both nostrils 2 (two) times daily. Patient taking differently: Place 1 spray into both nostrils 2 (two) times daily as needed for congestion. 11/30/22   Sherlynn Madden, MD  pravastatin  (PRAVACHOL ) 40 MG tablet Take 1 tablet (40 mg total) by mouth daily. 07/12/23   Ladona Heinz, MD  PRESCRIPTION MEDICATION Take 2-4 mg by mouth every 6 (six) hours as needed (dry mouth). Compounded Pilocarpine 4 mg/ml    [provider]  propranolol  (INDERAL ) 20 MG tablet Take 20 mg three times a day as needed for palpitations 08/19/23   Ladona Heinz, MD  propranolol  ER (INDERAL  LA) 80 MG 24 hr capsule Take 1 capsule (80 mg total) by mouth  at bedtime. 09/01/23   Ladona Heinz, MD  Vitamin D, Ergocalciferol, (DRISDOL) 1.25 MG (50000 UNIT) CAPS capsule TAKE 1 CAPSULE BY MOUTH ONE TIME PER WEEK Patient taking differently: Take 50,000 Units by mouth every 7 (seven) days. 03/18/23   [name], [name], NP  Zolpidem  Tartrate 3.5 MG SUBL TAKE 1 TABLET BY MOUTH EVERY DAY AT BEDTIME AS NEEDED 08/18/23   [name], [name], NP    Allergies: Lactose intolerance (gi), Norco [hydrocodone-acetaminophen ], Dust mite extract, Oxycontin [oxycodone hcl], and Pollen extract    Review of  Systems  Cardiovascular:  Positive for chest pain (resolved).  All other systems reviewed and are negative.   Updated Vital Signs BP (!) 124/50   Pulse (!) 47   Temp 98.2 F (36.8 C) (Oral)   Resp (!) 23   Ht 5' 3 (1.6 m)   Wt 71.1 kg   SpO2 95%   BMI 27.76 kg/m   Physical Exam Vitals and nursing note reviewed.  Constitutional:      General: She is not in acute distress.    Appearance: Normal appearance. She is normal weight. She is not ill-appearing, toxic-appearing or diaphoretic.  HENT:     Head: Normocephalic and atraumatic.  Cardiovascular:     Rate and Rhythm: Normal rate and regular rhythm.     Pulses:          Dorsalis pedis pulses are 2+ on the right side and 2+ on the left side.       Posterior tibial pulses are 2+ on the right side and 2+ on the left side.     Heart sounds: Normal heart sounds.  Pulmonary:     Effort: Pulmonary effort is normal. No respiratory distress.     Breath sounds: Normal breath sounds.  Abdominal:     Palpations: Abdomen is soft.     Tenderness: There is no abdominal tenderness.  Musculoskeletal:        General: Normal range of motion.     Cervical back: Normal range of motion.     Right lower leg: No tenderness. No edema.     Left lower leg: No tenderness. No edema.  Skin:    General: Skin is warm and dry.  Neurological:     General: No focal deficit present.     Mental Status: She is alert.  Psychiatric:        Mood and Affect: Mood normal.        Behavior: Behavior normal.     (all labs ordered are listed, but only abnormal results are displayed) Labs Reviewed  BASIC METABOLIC PANEL WITH GFR - Abnormal; Notable for the following components:      Result Value   Glucose, Bld 134 (*)    All other components within normal limits  CBC  TROPONIN I (HIGH SENSITIVITY)  TROPONIN I (HIGH SENSITIVITY)    EKG: EKG Interpretation Date/Time:  Thursday September 01 2023 11:33:21 EDT Ventricular Rate:  57 PR Interval:  196 QRS  Duration:  89 QT Interval:  425 QTC Calculation: 414 R Axis:   27  Text Interpretation: Sinus rhythm Low voltage, precordial leads Confirmed by Yolande Charleston [phone]) on 09/01/2023 12:53:38 PM  Radiology: [facility] Chest 2 View Result Date: 09/01/2023 CLINICAL DATA:  Chest pain. EXAM: CHEST - 2 VIEW COMPARISON:  08/10/2023. FINDINGS: The heart size and mediastinal contours are within normal limits. Aortic atherosclerosis. No focal consolidation, pleural effusion, or pneumothorax. No acute osseous abnormality. IMPRESSION: No acute cardiopulmonary findings. Electronically Signed  By: Harrietta Sherry M.D.   On: 09/01/2023 12:45     Procedures   Medications Ordered in the ED  sodium chloride  0.9 % bolus 500 mL (500 mLs Intravenous New Bag/Given 09/01/23 1307)                                    Medical Decision Making Amount and/or Complexity of Data Reviewed Labs: ordered. Radiology: ordered.  Risk OTC drugs.   This patient is a 89 y.o. female who presents to the ED for concern of chest pain, this involves an extensive number of treatment options, and is a complaint that carries with it a high risk of complications and morbidity. The emergent differential diagnosis prior to evaluation includes, but is not limited to,  ACS, pericarditis, myocarditis, aortic dissection, PE, pneumothorax, esophageal rupture, pneumonia, reflux/PUD, biliary disease, pancreatitis, costochondritis, anxiety  This is not an exhaustive differential.   Past Medical History / Co-morbidities / Social History:  has a past medical history of Anxiety, Atypical mole (11/11/2020), Colon polyps, Depression, Hyperlipidemia, Hypertension, Hypothyroidism, Melanoma in situ (HCC) (09/19/2018), Skin cancer, Sleep apnea, and Stroke (HCC) (2008).  Additional history: Chart reviewed. Pertinent results include: seen at Dr. Godfrey office this morning. She has history of Prinzmetal's angina, had a normal coronary CTA in 2022 with  coronary calcium  score of 0  Physical Exam: Physical exam performed. The pertinent findings include: Well-appearing, no acute physical exam abnormalities  Lab Tests: I ordered, and personally interpreted labs.  The pertinent results include:  no acute laboratory abnormalities. Troponin 2 --> 3   Imaging Studies: I ordered imaging studies including CXR. I independently visualized and interpreted imaging which showed NAD. I agree with the radiologist interpretation.   Cardiac Monitoring:  The patient was maintained on a cardiac monitor.  My attending physician Dr. Yolande viewed and interpreted the cardiac monitored which showed an underlying rhythm of: sinus rhythm, no STEMI. I agree with this interpretation.   Medications: I ordered medication including IV fluids, GI cocktail. Reevaluation of the patient after these medicines showed that the patient improved. I have reviewed the patients home medicines and have made adjustments as needed.  Consultations Obtained: I requested consultation with the patients cardiologist Dr. Ladona,  and discussed lab and imaging findings as well as pertinent plan - they recommend: give 500 cc bolus of fluids, can be discharged home   Disposition: After consideration of the diagnostic results and the patients response to treatment, I feel that emergency department workup does not suggest an emergent condition requiring admission or immediate intervention beyond what has been performed at this time. The plan is: Discharge with close outpatient follow-up and return precautions per cardiology recommendations.  Upon reassessment, patient feels well and ready to go home. Evaluation and diagnostic testing in the emergency department does not suggest an emergent condition requiring admission or immediate intervention beyond what has been performed at this time.  Plan for discharge with close PCP follow-up.  Patient is understanding and amenable with plan, educated on red  flag symptoms that would prompt immediate return.  Patient discharged in stable condition.  This is a shared visit with supervising physician Dr. Yolande who has independently evaluated patient & provided guidance in evaluation/management/disposition, in agreement with care   Final diagnoses:  Precordial chest pain    ED Discharge Orders     None     An After Visit Summary was printed and  given to the patient.      Darlene [name] 09/01/23 1544    Yolande Lamar BROCKS, MD 09/03/23 [phone]

## 2023-09-02 ENCOUNTER — Other Ambulatory Visit (HOSPITAL_COMMUNITY): Payer: Self-pay

## 2023-09-02 ENCOUNTER — Telehealth: Payer: Self-pay | Admitting: Cardiology

## 2023-09-02 DIAGNOSIS — I10 Essential (primary) hypertension: Secondary | ICD-10-CM

## 2023-09-02 DIAGNOSIS — F411 Generalized anxiety disorder: Secondary | ICD-10-CM

## 2023-09-02 MED ORDER — PROPRANOLOL HCL ER 80 MG PO CP24: 80.0000 mg | ORAL_CAPSULE | Freq: Every day | ORAL | 3 refills | Status: DC

## 2023-09-02 NOTE — Telephone Encounter (Signed)
 CALLED PATIENT AND NOTIFIED PATIENT THAT DESIRED MEDICATION WAS RESENT TO DESIRED PHARMACY. PATIENT WAS GLAD TO KNOW THAT IT WAS TAKING CARE OF

## 2023-09-02 NOTE — Telephone Encounter (Signed)
*  STAT* If patient is at the pharmacy, call can be transferred to refill team.   1. Which medications need to be refilled? (please list name of each medication and dose if known) propranolol  ER (INDERAL  LA) 80 MG 24 hr capsule   2. Would you like to learn more about the convenience, safety, & potential cost savings by using the [facility]?     3. Are you open to using the [facility] (Type [facility].  ).  4. Which pharmacy/location (including street and city if local pharmacy) is medication to be sent to? [id number] - [place], [place] - [address]   5. Do they need a 30 day or 90 day supply? 90 [facility]

## 2023-09-10 ENCOUNTER — Other Ambulatory Visit: Payer: Self-pay | Admitting: Nurse Practitioner

## 2023-09-10 ENCOUNTER — Other Ambulatory Visit (HOSPITAL_COMMUNITY): Payer: Self-pay

## 2023-09-10 DIAGNOSIS — K219 Gastro-esophageal reflux disease without esophagitis: Secondary | ICD-10-CM

## 2023-09-10 DIAGNOSIS — F411 Generalized anxiety disorder: Secondary | ICD-10-CM

## 2023-09-12 ENCOUNTER — Telehealth: Payer: Self-pay | Admitting: Cardiology

## 2023-09-12 ENCOUNTER — Other Ambulatory Visit: Payer: Self-pay | Admitting: Nurse Practitioner

## 2023-09-12 DIAGNOSIS — F411 Generalized anxiety disorder: Secondary | ICD-10-CM

## 2023-09-12 DIAGNOSIS — K219 Gastro-esophageal reflux disease without esophagitis: Secondary | ICD-10-CM

## 2023-09-12 NOTE — Telephone Encounter (Signed)
*  STAT* If patient is at the pharmacy, call can be transferred to refill team.   1. Which medications need to be refilled? (please list name of each medication and dose if known) levothyroxine  (SYNTHROID ) 75 MCG tablet    2. Would you like to learn more about the convenience, safety, & potential cost savings by using the [facility]?   3. Are you open to using the [facility] (Type [facility].  ).   4. Which pharmacy/location (including street and city if local pharmacy) is medication to be sent to? [id number] - [place], [place] - [address]    5. Do they need a 30 day or 90 day supply? 30 day   Patient only has 1 pill left

## 2023-09-12 NOTE — Telephone Encounter (Signed)
 Patient is requesting a refill of the following medications: Requested Prescriptions   Pending Prescriptions Disp Refills   Zolpidem  Tartrate 3.5 MG SUBL [Pharmacy Med Name: ZOLPIDEM  TART 3.5 MG TABLET SL] 30 tablet 0    Sig: TAKE 1 TABLET BY MOUTH AT BEDTIME AS NEEDED    Date of last refill:08/18/23   Refill amount: 30/0  Treatment agreement date: No contract on file

## 2023-09-12 NOTE — Telephone Encounter (Signed)
Please forward this to her PCP.

## 2023-09-12 NOTE — Telephone Encounter (Signed)
 Pt is requesting a refill on non cardiac medication levothyroxine . Would Dr. Ganji like to refill this medication? Please address

## 2023-09-13 ENCOUNTER — Other Ambulatory Visit (HOSPITAL_COMMUNITY): Payer: Self-pay

## 2023-09-14 ENCOUNTER — Other Ambulatory Visit: Payer: Self-pay | Admitting: Nurse Practitioner

## 2023-09-14 MED ORDER — LEVOTHYROXINE SODIUM 75 MCG PO TABS: 75.0000 ug | ORAL_TABLET | Freq: Every day | ORAL | 0 refills | Status: DC

## 2023-09-14 NOTE — Telephone Encounter (Signed)
 See CRM note. You were not the original prescriber.

## 2023-09-14 NOTE — Telephone Encounter (Signed)
 Copied from CRM [id number]. Topic: Clinical - Medication Refill >> Sep 14, 2023  8:35 AM Suzette B wrote: Medication:  levothyroxine  (SYNTHROID ) 75 MCG tablet  Has the patient contacted their pharmacy? Yes: Patient stated she went there on yesterday and stated it was nothing they could really do accept send the patient over to the providers office. Patient stated when she got there it was 4:50pm and the office was closed. She stated that the medication was sent to the wrong pharmacy at the wrong dosage and is now out of meds.  This is the patient's preferred pharmacy:  [id number] [place], [place] - [address] [place] [address] Phone: [phone] Fax: [phone]  Is this the correct pharmacy for this prescription? Yes If no, delete pharmacy and type the correct one.   Has the prescription been filled recently? Yes  Is the patient out of the medication? Yes  Has the patient been seen for an appointment in the last year OR does the patient have an upcoming appointment? Yes  Can we respond through MyChart? Yes  Agent: Please be advised that Rx refills may take up to 3 business days. We ask that you follow-up with your pharmacy.

## 2023-09-15 ENCOUNTER — Telehealth: Payer: Self-pay

## 2023-09-15 NOTE — Telephone Encounter (Signed)
 Copied from CRM [id number]. Topic: Clinical - Medication Question >> Sep 15, 2023  8:54 AM Miquel SAILOR wrote: Reason for CRM: Patient calling to get app for today butno available in person or video. Request to send message on Medication review/Surgery options with urology [phone]   Patient has made an appointment with [name], [name], NP next Thursday in regards to medication review and Surgery options

## 2023-09-18 ENCOUNTER — Other Ambulatory Visit: Payer: Self-pay | Admitting: Nurse Practitioner

## 2023-09-18 DIAGNOSIS — F411 Generalized anxiety disorder: Secondary | ICD-10-CM

## 2023-09-18 DIAGNOSIS — K219 Gastro-esophageal reflux disease without esophagitis: Secondary | ICD-10-CM

## 2023-09-20 NOTE — Telephone Encounter (Signed)
 Pharmacy requested refill.  Last Refill was set to [facility] did not received.  Pended Rx and sent to [facility] for approval.

## 2023-09-22 ENCOUNTER — Non-Acute Institutional Stay: Admitting: Nurse Practitioner

## 2023-09-22 ENCOUNTER — Encounter: Payer: Self-pay | Admitting: Nurse Practitioner

## 2023-09-22 VITALS — BP 134/80 | HR 71 | Temp 98.0°F | Ht 63.0 in | Wt 159.2 lb

## 2023-09-22 DIAGNOSIS — R32 Unspecified urinary incontinence: Secondary | ICD-10-CM

## 2023-09-22 DIAGNOSIS — E039 Hypothyroidism, unspecified: Secondary | ICD-10-CM | POA: Diagnosis not present

## 2023-09-22 DIAGNOSIS — Z8673 Personal history of transient ischemic attack (TIA), and cerebral infarction without residual deficits: Secondary | ICD-10-CM | POA: Diagnosis not present

## 2023-09-22 DIAGNOSIS — I10 Essential (primary) hypertension: Secondary | ICD-10-CM | POA: Diagnosis not present

## 2023-09-22 DIAGNOSIS — F5101 Primary insomnia: Secondary | ICD-10-CM | POA: Diagnosis not present

## 2023-09-22 DIAGNOSIS — K219 Gastro-esophageal reflux disease without esophagitis: Secondary | ICD-10-CM

## 2023-09-22 MED ORDER — CLOBETASOL PROPIONATE 0.05 % EX CREA: TOPICAL_CREAM | Freq: Two times a day (BID) | CUTANEOUS | Status: AC

## 2023-09-22 MED ORDER — KETOCONAZOLE 2 % EX SHAM: MEDICATED_SHAMPOO | CUTANEOUS | 1 refills | Status: AC

## 2023-09-22 MED ORDER — CLOTRIMAZOLE-BETAMETHASONE 1-0.05 % EX CREA
1.0000 | TOPICAL_CREAM | Freq: Two times a day (BID) | CUTANEOUS | 5 refills | Status: DC
Start: 2023-09-22 — End: 2023-10-27

## 2023-09-22 NOTE — Assessment & Plan Note (Signed)
 on Levothyroxine  87mcg/100mcg since 08/12/23, pending TSH 6 weeks.

## 2023-09-22 NOTE — Assessment & Plan Note (Signed)
 on Plavix . Statin,  residual of  poor left peripheral vision

## 2023-09-22 NOTE — Assessment & Plan Note (Signed)
 Failed dc Ambien 

## 2023-09-22 NOTE — Patient Instructions (Addendum)
 1.) TSH next Tuesday 09/27/23   2.) Follow-up as needed

## 2023-09-22 NOTE — Assessment & Plan Note (Signed)
 stable, taking Omeprazole , FDgard helped,  saw GI, EGD 12/26/19 and 05/11/23(dilated, no gross lesions)Vit B12 383 12/02/20, Hgb 13.5 09/01/23

## 2023-09-22 NOTE — Progress Notes (Signed)
 Location:   [facility]   Place of Service:  Clinic (12) Provider: Larwance Floyd Lusignan NP  Selmer Adduci X, NP  Patient Care Team: Janthony Holleman X, NP as PCP - General (Internal Medicine) Ladona Heinz, MD as PCP - Cardiology (Cardiology) Clark-Burning, Delon, PA-C (Inactive) (Dermatology) Ladona Heinz, MD as Consulting Physician (Cardiology) Mansouraty, Aloha Raddle., MD as Consulting Physician (Gastroenterology) Ethyl Lonni BRAVO, MD (Inactive) as Consulting Physician (Otolaryngology) Porter Andrez SAUNDERS, PA-C (Inactive) as Physician Assistant (Dermatology) Plonk, Bard Gander, MD as Referring Physician (Otolaryngology)  Extended Emergency Contact Information Primary Emergency Contact: Okey Ronal Pulling Home Phone: [phone] Relation: Friend Secondary Emergency Contact: Margaretann Heinz [place] Home Phone: [phone] Mobile Phone: [phone] Relation: Son  Code Status:  DNR Goals of care: Advanced Directive information    09/01/2023   11:26 AM  Advanced Directives  Does Patient Have a Medical Advance Directive? Yes  Type of Estate agent of [place];Living will     Chief Complaint  Patient presents with   Medication Review    Patient is requesting a through review of each medication. Discuss updating medication list, requesting removal of propranolol  80 mg and nystatin  cream. Discuss magnesium .    HPI:  Pt is a 89 y.o. female seen today for medical management of chronic diseases.    ED 09/01/23 Precordial chest pain occurred at her cardiology appointment, r/o MI.  Palpitation, 08/10/23-08/12/23 hospitalized, recommended to f/u Urology for bladder issues, started Propranolol  prn, decreased Levothyroxine  to , 2/2 TSH 0.15,  f/u TSH 6 weeks, EKG PACs/PVCs, consulted by Cardiology.    Dry mouth, didn't tolerate Cevimeline, cause frequent BM, night sweat, fatigue, wants to eliminate Ambien  to avoid day time sleepiness. Now special mouth wash  works well.               Resolved pain allover after immunotherapy infusion,, cancer center recommended Tylenol  up to 3000mg  a day for pain.                          After 2nd Shingrix, feels aches, headache, more nasal/ear congestion.   05/06/23 ED eval SOB, attributed to GERD-discharged with return precaution,  followed by GI, EGD 05/11/23, Dr. Lesly, no gross lesions, dilated, biopsied.  07/21/22 ED eval, dehydration, gum swelling             Malignant melanoma of nasal cavity, followed by oncology, ENT, dental team, remission.              Anxiety, chronic              Sleeps too much during day, failed dc Ambien  in the past,  off Valium , refused CPAP, feels a veil in front of left eye, more noticeable left facial weakness Hx of chronic rhinitis/sphenoidal sinusitis, s/p surgery, improved SOB, wheezing, but still has nasal/right ear congestion, on Astelin , Flonase , Bronchodilator, underwent Pulmonology, ENT, GI, Allergy  evaluation, had spirometry 10/20/20, Echo 60-65% EF 04/16/20, CTA 09/01/20. Saw ENT             Chronic sore throat: multiple factorials, GERD vs chronic rhinitis/sinusitis,  ENT  Insomnia, failed dc Ambien              Hx of CVA, on Plavix . Statin,  residual of  poor left peripheral vision  Hypothyroidism, on Levothyroxine  48mcg/100mcg since 08/12/23, pending TSH 6 weeks.              HTN, blood pressure is controlled on Losartan ,  Imdur  Followed by Cardiology. CTA negative 09/01/20. Bun/creat 18/0.76 09/01/23            GERD, stable, taking Omeprazole , FDgard helped,  saw GI, EGD 12/26/19 and 05/11/23(dilated, no gross lesions)Vit B12 383 12/02/20, Hgb 13.5 09/01/23             Lactose intolerance, diet adjustment, f/u GI             Esophageal dysmotility, Swallow studay 05/12/20, worked with ST, GI f/u             OSA sleep study 07/29/23 OSA, periodic limb movement during sleep,  Not using CPAP                                     OA, saw Ortho. Cervical spinal stenosis, MR 02/26/20,  C6-C7. Delaying  R mensicus tender surgery, Tramadol, Advil.              Hyperlipidemia, takes Pravastatin , LDL 94 05/12/21             Urinary frequency/leakage, 2-3x/night, stopped taking Gemtesa , acupuncture improved initially. Saw urology. treated with Augmentin  04/19/23, urine culture showed no growth 04/19/23, persisted with symptoms. Pending Medtronic bladder stimulator implant.              Osteopenia, DEXA 12/25/20, t score -1.0,  10 years possibility major fx 11%, hip fx 2.6%. Vit D, Cal-diet rich,  Vit D 3 39 12/26/20             HOH audiology eval, ENT placed tube in the right ear-failed-again, hearing aids   Past Medical History:  Diagnosis Date   Anxiety    Atypical mole 11/11/2020   Left Inner Knee (moderate-free)   Colon polyps    Depression    Hyperlipidemia    Hypertension    Hypothyroidism    Melanoma in situ (HCC) 09/19/2018   Right Arm (excision)   Skin cancer    Sleep apnea    Stroke [facility]) 2008   Past Surgical History:  Procedure Laterality Date   ADENOIDECTOMY     APPENDECTOMY  1960   CATARACT EXTRACTION Bilateral    COLONOSCOPY     ESOPHAGOGASTRODUODENOSCOPY N/A 05/11/2023   Procedure: EGD (ESOPHAGOGASTRODUODENOSCOPY);  Surgeon: Wilhelmenia Aloha Raddle., MD;  Location: [facility];  Service: Gastroenterology;  Laterality: N/A;   GUM SURGERY  03/2019   LUMBAR DISC SURGERY  2014   MELANOMA EXCISION  2020   MESH APPLIED TO LAP PORT     SINUS EXPLORATION     11/17/2021 operation for right nasal cavity and 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   TONSILLECTOMY  1940    Allergies  Allergen Reactions   Lactose Intolerance (Gi) Other (See Comments)    indigestion   Norco [Hydrocodone-Acetaminophen ] Nausea And Vomiting   Dust Mite Extract Other (See Comments)    Sinus drainage   Oxycontin [Oxycodone Hcl] Nausea And Vomiting   Pollen Extract Other (See Comments)    Sinus drainage    Allergies as of 09/22/2023       Reactions    Lactose Intolerance (gi) Other (See Comments)   indigestion   Norco [hydrocodone-acetaminophen ] Nausea And Vomiting   Dust Mite Extract Other (See Comments)   Sinus drainage   Oxycontin [oxycodone Hcl] Nausea And Vomiting   Pollen Extract Other (See Comments)   Sinus drainage  Medication List        Accurate as of September 22, 2023  4:37 PM. If you have any questions, ask your nurse or doctor.          STOP taking these medications    clobetasol  cream 0.05 % Commonly known as: TEMOVATE  Stopped by: Khizar Fiorella X Kirstein Baxley       TAKE these medications    ammonium lactate 12 % cream Commonly known as: AMLACTIN Apply 1 Application topically as needed for dry skin.   azelastine  0.1 % nasal spray Commonly known as: ASTELIN  Place 2 sprays into both nostrils 2 (two) times daily as needed.   clopidogrel  75 MG tablet Commonly known as: PLAVIX  TAKE 1 TABLET BY MOUTH EVERY DAY   clotrimazole -betamethasone  cream Commonly known as: LOTRISONE  Apply 1 Application topically 2 (two) times daily. What changed: Another medication with the same name was added. Make sure you understand how and when to take each. Changed by: Valecia Beske X Kippy Gohman   clotrimazole -betamethasone  cream Commonly known as: LOTRISONE  Apply 1 Application topically 2 (two) times daily. What changed: You were already taking a medication with the same name, and this prescription was added. Make sure you understand how and when to take each. Changed by: Amorah Sebring X Markis Langland   ketoconazole  2 % shampoo Commonly known as: NIZORAL  APPLY TOPICALLY 2 TIMES A WEEK What changed: See the new instructions.   levothyroxine  75 MCG tablet Commonly known as: SYNTHROID  Take 1 tablet (75 mcg total) by mouth daily at 6 (six) AM.   losartan  50 MG tablet Commonly known as: COZAAR  Take 1 tablet (50 mg total) by mouth daily.   nitroGLYCERIN  0.4 MG SL tablet Commonly known as: NITROSTAT  Place 1 tablet (0.4 mg total) under the tongue every 5  (five) minutes as needed for up to 25 days for chest pain.   nystatin  cream Commonly known as: MYCOSTATIN  Apply 1 Application topically 2 (two) times daily.   omeprazole  40 MG capsule Commonly known as: PRILOSEC Take 40 mg by mouth 2 (two) times daily before a meal.   oxymetazoline  0.05 % nasal spray Commonly known as: Afrin Nasal Spray Place 1 spray into both nostrils 2 (two) times daily.   pravastatin  40 MG tablet Commonly known as: PRAVACHOL  Take 1 tablet (40 mg total) by mouth daily.   PRESCRIPTION MEDICATION Take 2-4 mg by mouth every 6 (six) hours as needed (dry mouth). Compounded Pilocarpine 4 mg/ml   propranolol  20 MG tablet Commonly known as: INDERAL  Take 20 mg three times a day as needed for palpitations   propranolol  ER 80 MG 24 hr capsule Commonly known as: Inderal  LA Take 1 capsule (80 mg total) by mouth at bedtime.   Vitamin D (Ergocalciferol) 1.25 MG (50000 UNIT) Caps capsule Commonly known as: DRISDOL TAKE 1 CAPSULE BY MOUTH ONE TIME PER WEEK   Zolpidem  Tartrate 3.5 MG Subl TAKE 1 TABLET BY MOUTH AT BEDTIME AS NEEDED        Review of Systems  Constitutional:  Positive for fatigue. Negative for appetite change and fever.  HENT:  Positive for hearing loss. Negative for congestion and voice change.        Chronic sinusitis, underwent ENT, sleep study-sleep apnea.  Eyes:  Positive for visual disturbance.       Left lateral visual field deficit. Dry eyes  Respiratory:  Negative for cough and shortness of breath.   Cardiovascular:  Negative for leg swelling.  Gastrointestinal:  Negative for abdominal pain and constipation.  GERD wants to continue Omeprazole  30 min ac breakfast and 4hr after Levothyroxine .   Genitourinary:  Positive for frequency. Negative for hematuria and urgency.       Urinary leakage, urinary frequency 2-3x/night. Doing Kegel exercise. Followed by Urology  Musculoskeletal:  Positive for arthralgias. Negative for back pain and gait  problem.       R knee needs replacement.   Skin:  Negative for color change.       Mohs dorsum right hand, healed.   Neurological:  Negative for speech difficulty, weakness and headaches.  Psychiatric/Behavioral:  Positive for sleep disturbance. Negative for dysphoric mood. The patient is not nervous/anxious.        Early am awake, difficulty returning asleep.     Immunization History  Administered Date(s) Administered   Hepatitis A 04/04/2000   Influenza-Unspecified 10/18/2014   Moderna Sars-Covid-2 Vaccination 01/22/2019, 02/19/2019, 06/05/2020   Pneumococcal Conjugate-13 12/20/2018   Pneumococcal Polysaccharide-23 01/18/2005   Tdap 11/25/2008   Zoster Recombinant(Shingrix) 12/20/2018, 01/20/2022, 09/15/2022   Pertinent  Health Maintenance Due  Topic Date Due   Colonoscopy  10/25/2021   INFLUENZA VACCINE  08/19/2023   DEXA SCAN  Completed      11/30/2022   11:22 AM 12/09/2022    3:27 PM 12/30/2022   12:59 PM 04/19/2023    3:14 PM 05/19/2023    1:44 PM  Fall Risk  Falls in the past year? 0 0 0 0 0  Was there an injury with Fall? 0 0 0 0 0  Fall Risk Category Calculator 0 0 0 0 0  Patient at Risk for Falls Due to No Fall Risks   No Fall Risks No Fall Risks  Fall risk Follow up Falls evaluation completed;Education provided;Falls prevention discussed   Falls evaluation completed Falls evaluation completed   Functional Status Survey:    Vitals:   09/22/23 1527  BP: 134/80  Pulse: 71  Temp: 98 F (36.7 C)  SpO2: 95%  Weight: 159 lb 3.2 oz (72.2 kg)  Height: 5' 3 (1.6 m)   Body mass index is 28.2 kg/m. Physical Exam Vitals and nursing note reviewed.  Constitutional:      Appearance: Normal appearance.  HENT:     Head: Normocephalic and atraumatic.     Ears:     Comments: Right ear tube placed, mild erythema, no bleeding.     Nose: Nose normal.     Mouth/Throat:     Mouth: Mucous membranes are moist.  Eyes:     Extraocular Movements: Extraocular movements  intact.     Conjunctiva/sclera: Conjunctivae normal.     Pupils: Pupils are equal, round, and reactive to light.     Comments: Left peripheral visual field deficit since CVA 2006  Cardiovascular:     Rate and Rhythm: Normal rate and regular rhythm.     Heart sounds: No murmur heard. Pulmonary:     Effort: Pulmonary effort is normal.     Breath sounds: No rales.  Abdominal:     General: Bowel sounds are normal.     Palpations: Abdomen is soft.     Tenderness: There is no abdominal tenderness.  Musculoskeletal:        General: Tenderness present.     Cervical back: Normal range of motion and neck supple.     Right lower leg: No edema.     Left lower leg: No edema.     Comments: R knee pain when standing, no s/s of infection or injury.  Skin:    General: Skin is warm and dry.  Neurological:     General: No focal deficit present.     Mental Status: She is alert and oriented to person, place, and time. Mental status is at baseline.     Motor: Weakness present.     Gait: Gait normal.     Comments: Left facial weakness  Psychiatric:        Mood and Affect: Mood normal.        Behavior: Behavior normal.        Thought Content: Thought content normal.        Judgment: Judgment normal.     Labs reviewed: Recent Labs    08/10/23 1143 08/10/23 1144 08/11/23 0246 08/12/23 0254 09/01/23 1137  NA  --  146* 143  --  141  K  --  2.7* 4.3  --  4.3  CL  --  122* 111  --  109  CO2  --  17* 22  --  23  GLUCOSE  --  84 97  --  134*  BUN  --  9 11  --  18  CREATININE  --  0.47 0.73  --  0.76  CALCIUM   --  6.5* 9.5  --  9.4  MG 1.5*  --   --  2.4  --    Recent Labs    05/06/23 1114 08/10/23 1144 08/11/23 0246  AST 16 13* 19  ALT 14 12 16   ALKPHOS 60 39 55  BILITOT 0.7 0.6 0.9  PROT 6.3* 4.2* 6.3*  ALBUMIN 3.6 2.4* 3.5   Recent Labs    05/06/23 1114 07/22/23 1605 08/10/23 1144 08/11/23 0246 09/01/23 1137  WBC 5.6   < > 5.8 5.5 7.9  NEUTROABS 3.0  --  3.6  --   --    HGB 13.8   < > 13.5 13.8 13.5  HCT 42.9   < > 41.3 41.2 41.6  MCV 92.1   < > 91.2 89.4 92.7  PLT 231   < > 218 234 243   < > = values in this interval not displayed.   Lab Results  Component Value Date   TSH 0.150 (L) 08/10/2023   Lab Results  Component Value Date   HGBA1C 5.8 (H) 08/10/2023   Lab Results  Component Value Date   CHOL 163 05/12/2021   HDL 44 05/12/2021   LDLCALC 94 05/12/2021   TRIG 144 05/12/2021   CHOLHDL 3.5 12/26/2019    Significant Diagnostic Results in last 30 days:  DG Chest 2 View Result Date: 09/01/2023 CLINICAL DATA:  Chest pain. EXAM: CHEST - 2 VIEW COMPARISON:  08/10/2023. FINDINGS: The heart size and mediastinal contours are within normal limits. Aortic atherosclerosis. No focal consolidation, pleural effusion, or pneumothorax. No acute osseous abnormality. IMPRESSION: No acute cardiopulmonary findings. Electronically Signed   By: Harrietta Sherry M.D.   On: 09/01/2023 12:45    Assessment/Plan  Hypothyroidism on Levothyroxine  7mcg/100mcg since 08/12/23, pending TSH 6 weeks.   Insomnia Failed dc Ambien   History of stroke  on Plavix . Statin,  residual of  poor left peripheral vision   Essential hypertension blood pressure is controlled on Losartan ,  Imdur  Followed by Cardiology. CTA negative 09/01/20. Bun/creat 18/0.76 09/01/23  Gastroesophageal reflux disease stable, taking Omeprazole , FDgard helped,  saw GI, EGD 12/26/19 and 05/11/23(dilated, no gross lesions)Vit B12 383 12/02/20, Hgb 13.5 09/01/23  Incontinent of urine Urinary frequency/leakage, 2-3x/night, stopped taking Gemtesa , acupuncture improved initially. Saw urology.  treated with Augmentin  04/19/23, urine culture showed no growth 04/19/23, persisted with symptoms. Pending Medtronic bladder stimulator implant.    Family/ staff Communication: plan of care reviewed with the patient  Labs/tests ordered:  TSH 09/27/23

## 2023-09-22 NOTE — Assessment & Plan Note (Signed)
 Urinary frequency/leakage, 2-3x/night, stopped taking Gemtesa , acupuncture improved initially. Saw urology. treated with Augmentin  04/19/23, urine culture showed no growth 04/19/23, persisted with symptoms  Pending Medtronic bladder stimulator implant.

## 2023-09-22 NOTE — Assessment & Plan Note (Signed)
 blood pressure is controlled on Losartan ,  Imdur  Followed by Cardiology. CTA negative 09/01/20. Bun/creat 18/0.76 09/01/23

## 2023-09-27 LAB — TSH: TSH: 9.73 m[IU]/L — ABNORMAL HIGH (ref 0.40–4.50)

## 2023-09-29 ENCOUNTER — Other Ambulatory Visit: Payer: Self-pay | Admitting: Nurse Practitioner

## 2023-09-29 ENCOUNTER — Ambulatory Visit: Payer: Self-pay | Admitting: Nurse Practitioner

## 2023-09-29 DIAGNOSIS — E039 Hypothyroidism, unspecified: Secondary | ICD-10-CM

## 2023-09-29 NOTE — Progress Notes (Signed)
 SABRA

## 2023-10-02 ENCOUNTER — Other Ambulatory Visit: Payer: Self-pay | Admitting: Nurse Practitioner

## 2023-10-05 ENCOUNTER — Other Ambulatory Visit (HOSPITAL_COMMUNITY): Payer: Self-pay

## 2023-10-06 ENCOUNTER — Non-Acute Institutional Stay: Admitting: Nurse Practitioner

## 2023-10-06 ENCOUNTER — Encounter: Payer: Self-pay | Admitting: Nurse Practitioner

## 2023-10-06 VITALS — BP 126/80 | HR 60 | Temp 98.4°F

## 2023-10-06 DIAGNOSIS — E039 Hypothyroidism, unspecified: Secondary | ICD-10-CM | POA: Diagnosis not present

## 2023-10-06 DIAGNOSIS — I10 Essential (primary) hypertension: Secondary | ICD-10-CM

## 2023-10-06 DIAGNOSIS — R32 Unspecified urinary incontinence: Secondary | ICD-10-CM

## 2023-10-06 DIAGNOSIS — Z780 Asymptomatic menopausal state: Secondary | ICD-10-CM

## 2023-10-06 DIAGNOSIS — K219 Gastro-esophageal reflux disease without esophagitis: Secondary | ICD-10-CM | POA: Diagnosis not present

## 2023-10-06 DIAGNOSIS — E782 Mixed hyperlipidemia: Secondary | ICD-10-CM

## 2023-10-06 DIAGNOSIS — M8580 Other specified disorders of bone density and structure, unspecified site: Secondary | ICD-10-CM

## 2023-10-06 DIAGNOSIS — R002 Palpitations: Secondary | ICD-10-CM

## 2023-10-06 NOTE — Progress Notes (Signed)
 Location:   [facility]   Place of Service:  Clinic (12) Provider: Larwance Caydee Talkington NP  Beldon Nowling X, NP  Patient Care Team: Izela Altier X, NP as PCP - General (Internal Medicine) Ladona Heinz, MD as PCP - Cardiology (Cardiology) Clark-Burning, Delon, PA-C (Inactive) (Dermatology) Ladona Heinz, MD as Consulting Physician (Cardiology) Mansouraty, Aloha Raddle., MD as Consulting Physician (Gastroenterology) Ethyl Lonni BRAVO, MD (Inactive) as Consulting Physician (Otolaryngology) Porter Andrez SAUNDERS, PA-C (Inactive) as Physician Assistant (Dermatology) Plonk, Bard Gander, MD as Referring Physician (Otolaryngology)  Extended Emergency Contact Information Primary Emergency Contact: Okey Ronal Pulling Home Phone: [phone] Relation: Friend Secondary Emergency Contact: Morgan,Jay [place] Home Phone: [phone] Mobile Phone: [phone] Relation: Son  Code Status:  DNR Goals of care: Advanced Directive information    09/01/2023   11:26 AM  Advanced Directives  Does Patient Have a Medical Advance Directive? Yes  Type of Estate agent of [place];Living will     Chief Complaint  Patient presents with   Results    Discuss recent labs. No scale available at clinic today. Declined AWV at this time, states  I have too much going on right now.    Dizziness    Ongoing dizziness    Medication Management    Need medication marked as not taking removed from chart. Patient had to take nitroglycerin  x 2 this week.     HPI:  Pt is a 89 y.o. female seen today for medical management of chronic diseases.      ED 09/01/23 Precordial chest pain occurred at her cardiology appointment, r/o MI.             Palpitation, 08/10/23-08/12/23 hospitalized, recommended to f/u Urology for bladder issues, started Propranolol  prn, decreased Levothyroxine  to , 2/2 TSH 0.15<<9.73 09/27/23,  f/u TSH 2 weeks, EKG PACs/PVCs, consulted by Cardiology.               Dry  mouth, didn't tolerate Cevimeline, cause frequent BM, night sweat, fatigue, wants to eliminate Ambien  to avoid day time sleepiness. Now special mouth wash works well.               Resolved pain allover after immunotherapy infusion,, cancer center recommended Tylenol  up to 3000mg  a day for pain.                          After 2nd Shingrix, feels aches, headache, more nasal/ear congestion.   05/06/23 ED eval SOB, attributed to GERD-discharged with return precaution,  followed by GI, EGD 05/11/23, Dr. Lesly, no gross lesions, dilated, biopsied.  07/21/22 ED eval, dehydration, gum swelling             Malignant melanoma of nasal cavity, followed by oncology, ENT, dental team, remission.              Anxiety, chronic              Sleeps too much during day, failed dc Ambien  in the past,  off Valium , refused CPAP, feels a veil in front of left eye, more noticeable left facial weakness Hx of chronic rhinitis/sphenoidal sinusitis, s/p surgery, improved SOB, wheezing, but still has nasal/right ear congestion, on Astelin , Flonase , Bronchodilator, underwent Pulmonology, ENT, GI, Allergy  evaluation, had spirometry 10/20/20, Echo 60-65% EF 04/16/20, CTA 09/01/20. Saw ENT             Chronic sore throat: multiple factorials, GERD vs chronic rhinitis/sinusitis,  ENT  Insomnia, failed  dc Ambien              Hx of CVA, on Plavix . Statin,  residual of  poor left peripheral vision  Hypothyroidism, on Levothyroxine  48mcg/100mcg since 08/12/23,  2/2 TSH 0.15<<9.73 09/27/23,  f/u TSH 2 weeks             HTN, blood pressure is controlled on Losartan ,  Imdur  Followed by Cardiology. CTA negative 09/01/20. Bun/creat 18/0.76 09/01/23            GERD, stable, taking Omeprazole , FDgard helped,  saw GI, EGD 12/26/19 and 05/11/23(dilated, no gross lesions)Vit B12 383 12/02/20, Hgb 13.5 09/01/23             Lactose intolerance, diet adjustment, f/u GI             Esophageal dysmotility, Swallow studay 05/12/20, EGD 05/11/23,  worked with ST,  GI f/u             OSA sleep study 07/29/23 OSA, periodic limb movement during sleep,  Not using CPAP                                     OA, saw Ortho. Cervical spinal stenosis, MR 02/26/20, C6-C7. Delaying  R mensicus tender surgery, Tramadol, Advil.              Hyperlipidemia, takes Pravastatin , LDL 94 05/12/21             Urinary frequency/leakage, 2-3x/night, stopped taking Gemtesa , acupuncture improved initially. Saw urology. treated with Augmentin  04/19/23, urine culture showed no growth 04/19/23, persisted with symptoms. Pending Medtronic bladder stimulator implant.              Osteopenia, DEXA 12/25/20, t score -1.0,  10 years possibility major fx 11%, hip fx 2.6%. Vit D, Cal-diet rich,  Vit D 3 39 12/26/20             HOH audiology eval, ENT placed tube in the right ear-failed-again, hearing aids Past Medical History:  Diagnosis Date   Anxiety    Atypical mole 11/11/2020   Left Inner Knee (moderate-free)   Colon polyps    Depression    Hyperlipidemia    Hypertension    Hypothyroidism    Melanoma in situ (HCC) 09/19/2018   Right Arm (excision)   Skin cancer    Sleep apnea    Stroke [facility]) 2008   Past Surgical History:  Procedure Laterality Date   ADENOIDECTOMY     APPENDECTOMY  1960   CATARACT EXTRACTION Bilateral    COLONOSCOPY     ESOPHAGOGASTRODUODENOSCOPY N/A 05/11/2023   Procedure: EGD (ESOPHAGOGASTRODUODENOSCOPY);  Surgeon: Wilhelmenia Aloha Raddle., MD;  Location: [facility];  Service: Gastroenterology;  Laterality: N/A;   GUM SURGERY  03/2019   LUMBAR DISC SURGERY  2014   MELANOMA EXCISION  2020   MESH APPLIED TO LAP PORT     SINUS EXPLORATION     11/17/2021 operation for right nasal cavity and 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   TONSILLECTOMY  1940    Allergies  Allergen Reactions   Lactose Intolerance (Gi) Other (See Comments)    indigestion   Norco [Hydrocodone-Acetaminophen ] Nausea And Vomiting   Dust Mite Extract Other (See  Comments)    Sinus drainage   Oxycontin [Oxycodone Hcl] Nausea And Vomiting   Pollen Extract Other (See Comments)    Sinus drainage    Allergies  as of 10/06/2023       Reactions   Lactose Intolerance (gi) Other (See Comments)   indigestion   Norco [hydrocodone-acetaminophen ] Nausea And Vomiting   Dust Mite Extract Other (See Comments)   Sinus drainage   Oxycontin [oxycodone Hcl] Nausea And Vomiting   Pollen Extract Other (See Comments)   Sinus drainage        Medication List        Accurate as of October 06, 2023  3:26 PM. If you have any questions, ask your nurse or doctor.          ammonium lactate 12 % cream Commonly known as: AMLACTIN Apply 1 Application topically as needed for dry skin.   azelastine  0.1 % nasal spray Commonly known as: ASTELIN  Place 2 sprays into both nostrils 2 (two) times daily as needed.   clopidogrel  75 MG tablet Commonly known as: PLAVIX  TAKE 1 TABLET BY MOUTH EVERY DAY   clotrimazole -betamethasone  cream Commonly known as: LOTRISONE  Apply 1 Application topically 2 (two) times daily.   clotrimazole -betamethasone  cream Commonly known as: LOTRISONE  Apply 1 Application topically as needed.   clotrimazole -betamethasone  cream Commonly known as: LOTRISONE  Apply 1 Application topically 2 (two) times daily.   ketoconazole  2 % shampoo Commonly known as: NIZORAL  APPLY TOPICALLY 2 TIMES A WEEK   levothyroxine  75 MCG tablet Commonly known as: SYNTHROID  Take 1 tablet (75 mcg total) by mouth daily at 6 (six) AM.   losartan  50 MG tablet Commonly known as: COZAAR  TAKE 1 TABLET BY MOUTH EVERY DAY   nitroGLYCERIN  0.4 MG SL tablet Commonly known as: NITROSTAT  Place 1 tablet (0.4 mg total) under the tongue every 5 (five) minutes as needed for up to 25 days for chest pain.   nystatin  cream Commonly known as: MYCOSTATIN  Apply 1 Application topically daily.   nystatin  cream Commonly known as: MYCOSTATIN  Apply 1 Application topically 2  (two) times daily.   omeprazole  40 MG capsule Commonly known as: PRILOSEC Take 40 mg by mouth 2 (two) times daily before a meal.   oxymetazoline  0.05 % nasal spray Commonly known as: AFRIN Place 1 spray into both nostrils as needed for congestion.   oxymetazoline  0.05 % nasal spray Commonly known as: Afrin Nasal Spray Place 1 spray into both nostrils 2 (two) times daily.   pravastatin  40 MG tablet Commonly known as: PRAVACHOL  Take 1 tablet (40 mg total) by mouth daily.   PRESCRIPTION MEDICATION Take 2-4 mg by mouth every 6 (six) hours as needed (dry mouth). Compounded Pilocarpine 4 mg/ml   propranolol  20 MG tablet Commonly known as: INDERAL  Take 20 mg three times a day as needed for palpitations   propranolol  ER 80 MG 24 hr capsule Commonly known as: Inderal  LA Take 1 capsule (80 mg total) by mouth at bedtime.   traMADol 50 MG tablet Commonly known as: ULTRAM Take 50 mg by mouth every 6 (six) hours as needed.   Vitamin D (Ergocalciferol) 1.25 MG (50000 UNIT) Caps capsule Commonly known as: DRISDOL TAKE 1 CAPSULE BY MOUTH ONE TIME PER WEEK   Zolpidem  Tartrate 3.5 MG Subl TAKE 1 TABLET BY MOUTH AT BEDTIME AS NEEDED        Review of Systems  Constitutional:  Positive for fatigue. Negative for appetite change and fever.  HENT:  Positive for hearing loss. Negative for congestion and voice change.        Chronic sinusitis, underwent ENT, sleep study-sleep apnea.  Eyes:  Positive for visual disturbance.  Left lateral visual field deficit. Dry eyes  Respiratory:  Negative for cough and shortness of breath.   Cardiovascular:  Negative for leg swelling.  Gastrointestinal:  Negative for abdominal pain and constipation.       GERD wants to continue Omeprazole  30 min ac breakfast and 4hr after Levothyroxine .   Genitourinary:  Positive for frequency. Negative for hematuria and urgency.       Urinary leakage, urinary frequency 2-3x/night. Doing Kegel exercise. Followed by  Urology  Musculoskeletal:  Positive for arthralgias. Negative for back pain and gait problem.       R knee needs replacement.   Skin:  Negative for color change.       Mohs dorsum right hand, healed.   Neurological:  Negative for speech difficulty, weakness and headaches.  Psychiatric/Behavioral:  Positive for sleep disturbance. Negative for dysphoric mood. The patient is not nervous/anxious.        Early am awake, difficulty returning asleep.     Immunization History  Administered Date(s) Administered   Hepatitis A 04/04/2000   Influenza-Unspecified 10/18/2014   Moderna Sars-Covid-2 Vaccination 01/22/2019, 02/19/2019, 06/05/2020   Pneumococcal Conjugate-13 12/20/2018   Pneumococcal Polysaccharide-23 01/18/2005   Tdap 11/25/2008   Zoster Recombinant(Shingrix) 12/20/2018, 01/20/2022, 09/15/2022   Pertinent  Health Maintenance Due  Topic Date Due   Colonoscopy  10/25/2021   Influenza Vaccine  10/19/2023 (Originally 08/19/2023)   DEXA SCAN  Completed      11/30/2022   11:22 AM 12/09/2022    3:27 PM 12/30/2022   12:59 PM 04/19/2023    3:14 PM 05/19/2023    1:44 PM  Fall Risk  Falls in the past year? 0 0 0 0 0  Was there an injury with Fall? 0 0 0 0 0  Fall Risk Category Calculator 0 0 0 0 0  Patient at Risk for Falls Due to No Fall Risks   No Fall Risks No Fall Risks  Fall risk Follow up Falls evaluation completed;Education provided;Falls prevention discussed   Falls evaluation completed Falls evaluation completed   Functional Status Survey:    Vitals:   10/06/23 0919  BP: 126/80  Pulse: 60  Temp: 98.4 F (36.9 C)  TempSrc: Temporal  SpO2: 97%   There is no height or weight on file to calculate BMI. Physical Exam Vitals and nursing note reviewed.  Constitutional:      Appearance: Normal appearance.  HENT:     Head: Normocephalic and atraumatic.     Ears:     Comments: Right ear tube placed, mild erythema, no bleeding.     Nose: Nose normal.     Mouth/Throat:      Mouth: Mucous membranes are moist.  Eyes:     Extraocular Movements: Extraocular movements intact.     Conjunctiva/sclera: Conjunctivae normal.     Pupils: Pupils are equal, round, and reactive to light.     Comments: Left peripheral visual field deficit since CVA 2006  Cardiovascular:     Rate and Rhythm: Normal rate and regular rhythm.     Heart sounds: No murmur heard. Pulmonary:     Effort: Pulmonary effort is normal.     Breath sounds: No rales.  Abdominal:     General: Bowel sounds are normal.     Palpations: Abdomen is soft.     Tenderness: There is no abdominal tenderness.  Musculoskeletal:        General: Tenderness present.     Cervical back: Normal range of motion and neck supple.  Right lower leg: No edema.     Left lower leg: No edema.     Comments: R knee pain when standing, no s/s of infection or injury.   Skin:    General: Skin is warm and dry.  Neurological:     General: No focal deficit present.     Mental Status: She is alert and oriented to person, place, and time. Mental status is at baseline.     Motor: Weakness present.     Gait: Gait normal.     Comments: Left facial weakness  Psychiatric:        Mood and Affect: Mood normal.        Behavior: Behavior normal.        Thought Content: Thought content normal.        Judgment: Judgment normal.     Labs reviewed: Recent Labs    08/10/23 1143 08/10/23 1144 08/11/23 0246 08/12/23 0254 09/01/23 1137  NA  --  146* 143  --  141  K  --  2.7* 4.3  --  4.3  CL  --  122* 111  --  109  CO2  --  17* 22  --  23  GLUCOSE  --  84 97  --  134*  BUN  --  9 11  --  18  CREATININE  --  0.47 0.73  --  0.76  CALCIUM   --  6.5* 9.5  --  9.4  MG 1.5*  --   --  2.4  --    Recent Labs    05/06/23 1114 08/10/23 1144 08/11/23 0246  AST 16 13* 19  ALT 14 12 16   ALKPHOS 60 39 55  BILITOT 0.7 0.6 0.9  PROT 6.3* 4.2* 6.3*  ALBUMIN 3.6 2.4* 3.5   Recent Labs    05/06/23 1114 07/22/23 1605 08/10/23 1144  08/11/23 0246 09/01/23 1137  WBC 5.6   < > 5.8 5.5 7.9  NEUTROABS 3.0  --  3.6  --   --   HGB 13.8   < > 13.5 13.8 13.5  HCT 42.9   < > 41.3 41.2 41.6  MCV 92.1   < > 91.2 89.4 92.7  PLT 231   < > 218 234 243   < > = values in this interval not displayed.   Lab Results  Component Value Date   TSH 9.73 (H) 09/27/2023   Lab Results  Component Value Date   HGBA1C 5.8 (H) 08/10/2023   Lab Results  Component Value Date   CHOL 163 05/12/2021   HDL 44 05/12/2021   LDLCALC 94 05/12/2021   TRIG 144 05/12/2021   CHOLHDL 3.5 12/26/2019    Significant Diagnostic Results in last 30 days:  No results found.  Assessment/Plan  Hypothyroidism decreased Levothyroxine  to 08/12/23, 2/2 TSH 0.15<<9.73 09/27/23,  f/u TSH 2 weeks Increase Levothyroxine  to 100mcg every day po  Essential hypertension blood pressure is controlled on Losartan ,  Imdur  Followed by Cardiology. CTA negative 09/01/20. Bun/creat 18/0.76 09/01/23  Gastroesophageal reflux disease stable, taking Omeprazole , FDgard helped,  saw GI, EGD 12/26/19 and 05/11/23(dilated, no gross lesions)Vit B12 383 12/02/20, Hgb 13.5 09/01/23  Hyperlipidemia  takes Pravastatin , LDL 94 05/12/21  Incontinent of urine  Urinary frequency/leakage, 2-3x/night, stopped taking Gemtesa , acupuncture improved initially. Saw urology. treated with Augmentin  04/19/23, urine culture showed no growth 04/19/23, persisted with symptoms. Pending Medtronic bladder stimulator implant.   Osteopenia after menopause DEXA 12/25/20, t score -1.0,  10 years possibility  major fx 11%, hip fx 2.6%. Vit D, Cal-diet rich,  Vit D 3 39 12/26/20  Palpitations Prn Propranolol , noted HR in 50s after Propranolol .    Family/ staff Communication: plan of care reviewed with the patient  Labs/tests ordered:  pending TSH 2 weeks

## 2023-10-06 NOTE — Assessment & Plan Note (Signed)
 Urinary frequency/leakage, 2-3x/night, stopped taking Gemtesa , acupuncture improved initially. Saw urology. treated with Augmentin  04/19/23, urine culture showed no growth 04/19/23, persisted with symptoms  Pending Medtronic bladder stimulator implant.

## 2023-10-06 NOTE — Assessment & Plan Note (Addendum)
 decreased Levothyroxine  to 75mcg 08/12/23, 2/2 TSH 0.15<<9.73 09/27/23,  f/u TSH 2 weeks Increase Levothyroxine  to 100mcg every day po

## 2023-10-06 NOTE — Assessment & Plan Note (Signed)
 blood pressure is controlled on Losartan ,  Imdur  Followed by Cardiology. CTA negative 09/01/20. Bun/creat 18/0.76 09/01/23

## 2023-10-06 NOTE — Assessment & Plan Note (Signed)
DEXA 12/25/20, t score -1.0,  10 years possibility major fx 11%, hip fx 2.6%. Vit D, Cal-diet rich,  Vit D 3 39 12/26/20

## 2023-10-06 NOTE — Assessment & Plan Note (Signed)
 stable, taking Omeprazole , FDgard helped,  saw GI, EGD 12/26/19 and 05/11/23(dilated, no gross lesions)Vit B12 383 12/02/20, Hgb 13.5 09/01/23

## 2023-10-06 NOTE — Patient Instructions (Addendum)
 1.) Contact Verneita, (independent living nurse) about getting on the schedule for the flu vaccine clinic scheduled on 11/03/23 ([facility]) 10:30-1 pm   2.) Repeat TSH 10/11/23 @ 7:00 am and follow-up as needed.

## 2023-10-06 NOTE — Assessment & Plan Note (Signed)
 Prn Propranolol , noted HR in 50s after Propranolol .

## 2023-10-06 NOTE — Assessment & Plan Note (Signed)
takes Pravastatin, LDL 94 05/12/21 

## 2023-10-07 ENCOUNTER — Other Ambulatory Visit (HOSPITAL_COMMUNITY): Payer: Self-pay

## 2023-10-09 ENCOUNTER — Other Ambulatory Visit: Payer: Self-pay | Admitting: Nurse Practitioner

## 2023-10-11 ENCOUNTER — Ambulatory Visit: Payer: Self-pay | Admitting: Nurse Practitioner

## 2023-10-11 ENCOUNTER — Other Ambulatory Visit: Payer: Self-pay | Admitting: Nurse Practitioner

## 2023-10-11 DIAGNOSIS — E039 Hypothyroidism, unspecified: Secondary | ICD-10-CM

## 2023-10-11 LAB — TSH: TSH: 14.75 m[IU]/L — ABNORMAL HIGH (ref 0.40–4.50)

## 2023-10-12 ENCOUNTER — Telehealth: Payer: Self-pay

## 2023-10-12 ENCOUNTER — Other Ambulatory Visit: Payer: Self-pay | Admitting: Nurse Practitioner

## 2023-10-12 DIAGNOSIS — E039 Hypothyroidism, unspecified: Secondary | ICD-10-CM

## 2023-10-12 NOTE — Telephone Encounter (Signed)
 Patient states she would like a STAT referral to endocrinology due to elevated thyroid  levels

## 2023-10-13 ENCOUNTER — Telehealth: Payer: Self-pay | Admitting: Cardiology

## 2023-10-13 ENCOUNTER — Ambulatory Visit: Admitting: Physician Assistant

## 2023-10-13 ENCOUNTER — Encounter: Payer: Self-pay | Admitting: Cardiology

## 2023-10-13 NOTE — Telephone Encounter (Signed)
   Pre-operative Risk Assessment    Patient Name: Darlene [name]  DOB: Aug 13, 1933 MRN: [id number]   Date of last office visit: 09/01/23 Date of next office visit: n/a   Request for Surgical Clearance    Procedure:  peripheral nerve elevation   Date of Surgery:  Clearance TBD                                Surgeon:  Dr. Gaston Socks Group or Practice Name:  [facility] Phone number:  [phone] Fax number:  [phone]   Type of Clearance Requested:   - Medical  - Pharmacy:  Hold Clopidogrel  (Plavix ) TBD   Type of Anesthesia:  Local    Additional requests/questions:     SignedBarbee DELENA Sharps   10/13/2023, 11:13 AM

## 2023-10-13 NOTE — Telephone Encounter (Signed)
 Dr. Ladona,  Darlene [name] is requesting preoperative cardiac evaluation for peripheral nerve elevation.  She was recently seen by you in clinic on 09/01/2023.  She became unstable and was transported to the emergency department.  She received IV fluids and was discharged home.  Would you be able to provide recommendations on cardiac risk for upcoming procedure?  May her Plavix  be held prior to her surgery?  Please direct your response to [facility] preop pool.  Thank you for your help.  [name]. Jarron Curley NP-C     10/13/2023, 11:26 AM [facility] [address] [place], [place] [address] Office [phone]

## 2023-10-13 NOTE — Telephone Encounter (Signed)
 Letter sent to [facility] can be seen in the letter section on epic

## 2023-10-18 ENCOUNTER — Telehealth: Payer: Self-pay

## 2023-10-18 ENCOUNTER — Telehealth: Payer: Self-pay | Admitting: Cardiology

## 2023-10-18 MED ORDER — PROPRANOLOL HCL 20 MG PO TABS: ORAL_TABLET | ORAL | 11 refills | Status: AC

## 2023-10-18 NOTE — Telephone Encounter (Signed)
*  STAT* If patient is at the pharmacy, call can be transferred to refill team.   1. Which medications need to be refilled? (please list name of each medication and dose if known)   propranolol  (INDERAL ) 20 MG tablet    2. Which pharmacy/location (including street and city if local pharmacy) is medication to be sent to? [id number] - [place], [place] - [address]   3. Do they need a 30 day or 90 day supply? 90

## 2023-10-18 NOTE — Telephone Encounter (Signed)
 Pt's medication was sent to pt's pharmacy as requested. Confirmation received.

## 2023-10-18 NOTE — Telephone Encounter (Signed)
 Spoke with patient and informed her that she was referred to a Endocrinologist and they will take over managing TSH. Patient was recently switched back to 100 mcg tablet from 75 mcg.  Patient stated she was not able to get in with [facility] until December 2025, so she called [facility] and has an appointment in early November. Patient is asking is she can have her TSH rechecked next week.  Please advise

## 2023-10-18 NOTE — Telephone Encounter (Signed)
 Copied from CRM #[id number]. Topic: Clinical - Request for Lab/Test Order >> Oct 18, 2023  9:40 AM Zane F wrote: Reason for CRM:   Patient is calling in to have labs placed to monitor her hyperthyroidism. Patient is looking to have the lab orders placed as soon as possible so she can have them completed at [facility]. The patient feels that the change in her levels should have resulted in an increase dose of her levothyroxine  (SYNTHROID ) 75 MCG tablet prescription but a change has not been made.   The patient states she is having a number of symptoms directly related to hyperthyroidism including extreme fatigue.   Patient will be sent to Nurse Triage.   Please call patient when lab orders have been placed.    Callback Number: [phone]

## 2023-10-19 ENCOUNTER — Other Ambulatory Visit: Payer: Self-pay | Admitting: Nurse Practitioner

## 2023-10-19 DIAGNOSIS — E039 Hypothyroidism, unspecified: Secondary | ICD-10-CM

## 2023-10-20 NOTE — Telephone Encounter (Signed)
 [name], [name], NP  Veludandi, Prashanthi, MD20 hours ago (1:37 PM)    TSH ordered for next Thursday thanks.   Patient aware, and stated she ran into ManX in the hallway and it was decided that she would have labs on Tuesday and see ManX on Thursday.

## 2023-10-27 ENCOUNTER — Non-Acute Institutional Stay: Admitting: Nurse Practitioner

## 2023-10-27 ENCOUNTER — Other Ambulatory Visit: Payer: Self-pay | Admitting: Physician Assistant

## 2023-10-27 ENCOUNTER — Encounter: Payer: Self-pay | Admitting: Nurse Practitioner

## 2023-10-27 VITALS — BP 124/80 | HR 70 | Temp 97.8°F | Ht 63.0 in | Wt 156.6 lb

## 2023-10-27 DIAGNOSIS — E782 Mixed hyperlipidemia: Secondary | ICD-10-CM

## 2023-10-27 DIAGNOSIS — K219 Gastro-esophageal reflux disease without esophagitis: Secondary | ICD-10-CM

## 2023-10-27 DIAGNOSIS — I10 Essential (primary) hypertension: Secondary | ICD-10-CM

## 2023-10-27 DIAGNOSIS — R002 Palpitations: Secondary | ICD-10-CM

## 2023-10-27 DIAGNOSIS — E039 Hypothyroidism, unspecified: Secondary | ICD-10-CM | POA: Diagnosis not present

## 2023-10-27 NOTE — Progress Notes (Unsigned)
 Location:   [facility]   Place of Service:  Clinic (12) Provider: Larwance Cristoval Teall NP  Caileen Veracruz X, NP  Patient Care Team: Karthikeya Funke X, NP as PCP - General (Internal Medicine) Ladona Heinz, MD as PCP - Cardiology (Cardiology) Clark-Burning, Delon, PA-C (Inactive) (Dermatology) Ladona Heinz, MD as Consulting Physician (Cardiology) Mansouraty, Aloha Raddle., MD as Consulting Physician (Gastroenterology) Ethyl Lonni BRAVO, MD (Inactive) as Consulting Physician (Otolaryngology) Porter Andrez SAUNDERS, PA-C (Inactive) as Physician Assistant (Dermatology) Plonk, Bard Gander, MD as Referring Physician (Otolaryngology)  Extended Emergency Contact Information Primary Emergency Contact: Okey Ronal Pulling Home Phone: [phone] Relation: Friend Secondary Emergency Contact: Hewins,Jay [place] Home Phone: [phone] Mobile Phone: [phone] Relation: Son  Code Status:  DNR Goals of care: Advanced Directive information    09/01/2023   11:26 AM  Advanced Directives  Does Patient Have a Medical Advance Directive? Yes  Type of Estate agent of [place];Living will     Chief Complaint  Patient presents with   Thyroid  Problem    Discuss ongoing issues related to abnormal thyroid  levels    Medication Management    Requesting updated medication list, all medications marked as not taking need to be removed    Hospitalization Follow-up    Patient was seen in the hospital on Monday for daytime sleepiness despite sleeping well at night     HPI:  Pt is a 89 y.o. female seen today for f/u ED eval 10/24/23 for daytime sleepiness, the patient attributed it to her recent Levothyroxine  dose change, pending endocrinology eval in 3 weeks.   ED 09/01/23 Precordial chest pain occurred at her cardiology appointment, r/o MI.             Palpitation, 08/10/23-08/12/23 hospitalized, recommended to f/u Urology for bladder issues, started Propranolol  prn, decreased  Levothyroxine  to , 2/2 TSH 0.15 07/2023, EKG PACs/PVCs, consulted by Cardiology.               Dry mouth, didn't tolerate Cevimeline, cause frequent BM, night sweat, fatigue, wants to eliminate Ambien  to avoid day time sleepiness. Now special mouth wash works well.              Resolved pain allover after immunotherapy infusion,, cancer center recommended Tylenol  up to 3000mg  a day for pain.             After 2nd Shingrix, feels aches, headache, more nasal/ear congestion.   05/06/23 ED eval SOB, attributed to GERD-discharged with return precaution,  followed by GI, EGD 05/11/23, Dr. Lesly, no gross lesions, dilated, biopsied.  07/21/22 ED eval, dehydration, gum swelling             Malignant melanoma of nasal cavity, followed by oncology, ENT, dental team, remission.              Anxiety, chronic              Sleeps too much during day, failed dc Ambien  in the past,  off Valium , refused CPAP, feels a veil in front of left eye, more noticeable left facial weakness Hx of chronic rhinitis/sphenoidal sinusitis, s/p surgery, improved SOB, wheezing, but still has nasal/right ear congestion, on Astelin , Flonase , Bronchodilator, underwent Pulmonology, ENT, GI, Allergy  evaluation, had spirometry 10/20/20, Echo 60-65% EF 04/16/20, CTA 09/01/20. Saw ENT             Chronic sore throat: multiple factorials, GERD vs chronic rhinitis/sinusitis,  ENT  Insomnia, failed dc Ambien   Hx of CVA, on Plavix . Statin,  residual of  poor left peripheral vision  Hypothyroidism, on Levothyroxine  32mcg/100mcg since 08/12/23 while in hospital,  2/2 TSH 0.15 08/10/23. 10/06/23 Increased Levothyroxine  to 100mcg every day po 2/2 TSH 9.73 09/27/23 . TSH 14.75 10/11/23, pending endocrinology eval 3 weeks, declined adjusting Levothyroxine  dose             HTN, blood pressure is controlled on Losartan ,  Imdur  Followed by Cardiology. CTA negative 09/01/20. Bun/creat 18/0.76 09/01/23            GERD, stable, taking Omeprazole , FDgard  helped,  saw GI, EGD 12/26/19 and 05/11/23(dilated, no gross lesions)Vit B12 383 12/02/20, Hgb 13.5 09/01/23             Lactose intolerance, diet adjustment, f/u GI             Esophageal dysmotility, Swallow studay 05/12/20, EGD 05/11/23,  worked with ST, GI f/u             OSA sleep study 07/29/23 OSA, periodic limb movement during sleep,  Not using CPAP                                     OA, saw Ortho. Cervical spinal stenosis, MR 02/26/20, C6-C7. Delaying  R mensicus tender surgery, Tramadol, Advil.              Hyperlipidemia, takes Pravastatin , LDL 94 05/12/21             Urinary frequency/leakage, 2-3x/night, stopped taking Gemtesa , acupuncture improved initially. Saw urology. treated with Augmentin  04/19/23, urine culture showed no growth 04/19/23, persisted with symptoms. Pending Medtronic bladder stimulator implant.              Osteopenia, DEXA 12/25/20, t score -1.0,  10 years possibility major fx 11%, hip fx 2.6%. Vit D, Cal-diet rich,  Vit D 3 39 12/26/20             HOH audiology eval, ENT placed tube in the right ear-failed-again, hearing aids    Past Medical History:  Diagnosis Date   Anxiety    Atypical mole 11/11/2020   Left Inner Knee (moderate-free)   Colon polyps    Depression    Hyperlipidemia    Hypertension    Hypothyroidism    Melanoma in situ (HCC) 09/19/2018   Right Arm (excision)   Skin cancer    Sleep apnea    Stroke [facility]) 2008   Past Surgical History:  Procedure Laterality Date   ADENOIDECTOMY     APPENDECTOMY  1960   CATARACT EXTRACTION Bilateral    COLONOSCOPY     ESOPHAGOGASTRODUODENOSCOPY N/A 05/11/2023   Procedure: EGD (ESOPHAGOGASTRODUODENOSCOPY);  Surgeon: Wilhelmenia Aloha Raddle., MD;  Location: [facility];  Service: Gastroenterology;  Laterality: N/A;   GUM SURGERY  03/2019   LUMBAR DISC SURGERY  2014   MELANOMA EXCISION  2020   MESH APPLIED TO LAP PORT     SINUS EXPLORATION     11/17/2021 operation for right nasal cavity and surgical path showed  invasive mucosal melanoma nasal vault area.   TONSILLECTOMY  1940    Allergies  Allergen Reactions   Lactose Intolerance (Gi) Other (See Comments)    indigestion   Norco [Hydrocodone-Acetaminophen ] Nausea And Vomiting   Dust Mite Extract Other (See Comments)    Sinus drainage   Oxycontin [Oxycodone Hcl] Nausea And Vomiting   Pollen Extract  Other (See Comments)    Sinus drainage    Allergies as of 10/27/2023       Reactions   Lactose Intolerance (gi) Other (See Comments)   indigestion   Norco [hydrocodone-acetaminophen ] Nausea And Vomiting   Dust Mite Extract Other (See Comments)   Sinus drainage   Oxycontin [oxycodone Hcl] Nausea And Vomiting   Pollen Extract Other (See Comments)   Sinus drainage        Medication List        Accurate as of October 27, 2023 11:59 PM. If you have any questions, ask your nurse or doctor.          ammonium lactate 12 % cream Commonly known as: AMLACTIN Apply 1 Application topically as needed for dry skin.   amoxicillin  500 MG capsule Commonly known as: AMOXIL  Take 500 mg by mouth 3 (three) times daily.   azelastine  0.1 % nasal spray Commonly known as: ASTELIN  Place 2 sprays into both nostrils 2 (two) times daily as needed.   clopidogrel  75 MG tablet Commonly known as: PLAVIX  TAKE 1 TABLET BY MOUTH EVERY DAY   clotrimazole -betamethasone  cream Commonly known as: LOTRISONE  Apply 1 Application topically as needed.   ketoconazole  2 % shampoo Commonly known as: NIZORAL  APPLY TOPICALLY 2 TIMES A WEEK   levothyroxine  100 MCG tablet Commonly known as: SYNTHROID  Take 1 tablet (100 mcg total) by mouth daily.   losartan  50 MG tablet Commonly known as: COZAAR  TAKE 1 TABLET BY MOUTH EVERY DAY   nitroGLYCERIN  0.4 MG SL tablet Commonly known as: NITROSTAT  Place 1 tablet (0.4 mg total) under the tongue every 5 (five) minutes as needed for up to 25 days for chest pain.   nystatin  cream Commonly known as: MYCOSTATIN  Apply 1  Application topically daily. What changed: Another medication with the same name was removed. Continue taking this medication, and follow the directions you see here. Changed by: Wendy Hoback X Mong Neal   omeprazole  40 MG capsule Commonly known as: PRILOSEC TAKE 1 CAPSULE BY MOUTH 2 (TWO) TIMES DAILY BEFORE A MEAL. What changed: See the new instructions. Changed by: Delon Hendricks Failing   oxymetazoline  0.05 % nasal spray Commonly known as: AFRIN Place 1 spray into both nostrils as needed for congestion.   pravastatin  40 MG tablet Commonly known as: PRAVACHOL  Take 1 tablet (40 mg total) by mouth daily.   PRESCRIPTION MEDICATION Take 2-4 mg by mouth every 6 (six) hours as needed (dry mouth). Compounded Pilocarpine 4 mg/ml   propranolol  20 MG tablet Commonly known as: INDERAL  Take 20 mg three times a day as needed for palpitations What changed: Another medication with the same name was removed. Continue taking this medication, and follow the directions you see here. Changed by: Shravan Salahuddin X Capers Hagmann   traMADol 50 MG tablet Commonly known as: ULTRAM Take 50 mg by mouth every 6 (six) hours as needed.   Vitamin D (Ergocalciferol) 1.25 MG (50000 UNIT) Caps capsule Commonly known as: DRISDOL TAKE 1 CAPSULE BY MOUTH ONE TIME PER WEEK   Zolpidem  Tartrate 3.5 MG Subl TAKE 1 TABLET BY MOUTH AT BEDTIME AS NEEDED        Review of Systems  Constitutional:  Positive for fatigue. Negative for appetite change and fever.  HENT:  Positive for hearing loss. Negative for congestion and voice change.        Chronic sinusitis, underwent ENT, sleep study-sleep apnea.  Eyes:  Positive for visual disturbance.       Left lateral visual field deficit.  Dry eyes  Respiratory:  Negative for cough and shortness of breath.   Cardiovascular:  Negative for leg swelling.  Gastrointestinal:  Negative for abdominal pain and constipation.       GERD wants to continue Omeprazole  30 min ac breakfast and 4hr after Levothyroxine .    Genitourinary:  Positive for frequency. Negative for hematuria and urgency.       Urinary leakage, urinary frequency 2-3x/night. Doing Kegel exercise. Followed by Urology  Musculoskeletal:  Positive for arthralgias. Negative for back pain and gait problem.       R knee needs replacement.   Skin:  Negative for color change.       Mohs dorsum right hand, healed.   Neurological:  Negative for speech difficulty, weakness and headaches.  Psychiatric/Behavioral:  Positive for sleep disturbance. Negative for dysphoric mood. The patient is not nervous/anxious.        Early am awake, difficulty returning asleep.     Immunization History  Administered Date(s) Administered   Hepatitis A 04/04/2000   Influenza-Unspecified 10/18/2014   Moderna Sars-Covid-2 Vaccination 01/22/2019, 02/19/2019, 06/05/2020   Pneumococcal Conjugate-13 12/20/2018   Pneumococcal Polysaccharide-23 01/18/2005   Tdap 11/25/2008   Zoster Recombinant(Shingrix) 12/20/2018, 01/20/2022, 09/15/2022   Pertinent  Health Maintenance Due  Topic Date Due   Colonoscopy  10/25/2021   Influenza Vaccine  08/19/2023   DEXA SCAN  Completed      11/30/2022   11:22 AM 12/09/2022    3:27 PM 12/30/2022   12:59 PM 04/19/2023    3:14 PM 05/19/2023    1:44 PM  Fall Risk  Falls in the past year? 0 0 0 0 0  Was there an injury with Fall? 0 0 0 0 0  Fall Risk Category Calculator 0 0 0 0 0  Patient at Risk for Falls Due to No Fall Risks   No Fall Risks No Fall Risks  Fall risk Follow up Falls evaluation completed;Education provided;Falls prevention discussed   Falls evaluation completed Falls evaluation completed   Functional Status Survey:    Vitals:   10/27/23 1112  BP: 124/80  Pulse: 70  Temp: 97.8 F (36.6 C)  SpO2: 98%  Weight: 156 lb 9.6 oz (71 kg)  Height: 5' 3 (1.6 m)   Body mass index is 27.74 kg/m. Physical Exam Vitals and nursing note reviewed.  Constitutional:      Appearance: Normal appearance.  HENT:     Head:  Normocephalic and atraumatic.     Ears:     Comments: Right ear tube placed, mild erythema, no bleeding.     Nose: Nose normal.     Mouth/Throat:     Mouth: Mucous membranes are moist.  Eyes:     Extraocular Movements: Extraocular movements intact.     Conjunctiva/sclera: Conjunctivae normal.     Pupils: Pupils are equal, round, and reactive to light.     Comments: Left peripheral visual field deficit since CVA 2006  Cardiovascular:     Rate and Rhythm: Normal rate and regular rhythm.     Heart sounds: No murmur heard. Pulmonary:     Effort: Pulmonary effort is normal.     Breath sounds: No rales.  Abdominal:     General: Bowel sounds are normal.     Palpations: Abdomen is soft.     Tenderness: There is no abdominal tenderness.  Musculoskeletal:        General: Tenderness present.     Cervical back: Normal range of motion and neck supple.  Right lower leg: No edema.     Left lower leg: No edema.     Comments: R knee pain when standing, no s/s of infection or injury.   Skin:    General: Skin is warm and dry.  Neurological:     General: No focal deficit present.     Mental Status: She is alert and oriented to person, place, and time. Mental status is at baseline.     Motor: Weakness present.     Gait: Gait normal.     Comments: Left facial weakness  Psychiatric:        Mood and Affect: Mood normal.        Behavior: Behavior normal.        Thought Content: Thought content normal.        Judgment: Judgment normal.     Labs reviewed: Recent Labs    08/10/23 1143 08/10/23 1144 08/11/23 0246 08/12/23 0254 09/01/23 1137  NA  --  146* 143  --  141  K  --  2.7* 4.3  --  4.3  CL  --  122* 111  --  109  CO2  --  17* 22  --  23  GLUCOSE  --  84 97  --  134*  BUN  --  9 11  --  18  CREATININE  --  0.47 0.73  --  0.76  CALCIUM   --  6.5* 9.5  --  9.4  MG 1.5*  --   --  2.4  --    Recent Labs    05/06/23 1114 08/10/23 1144 08/11/23 0246  AST 16 13* 19  ALT 14 12  16   ALKPHOS 60 39 55  BILITOT 0.7 0.6 0.9  PROT 6.3* 4.2* 6.3*  ALBUMIN 3.6 2.4* 3.5   Recent Labs    05/06/23 1114 07/22/23 1605 08/10/23 1144 08/11/23 0246 09/01/23 1137  WBC 5.6   < > 5.8 5.5 7.9  NEUTROABS 3.0  --  3.6  --   --   HGB 13.8   < > 13.5 13.8 13.5  HCT 42.9   < > 41.3 41.2 41.6  MCV 92.1   < > 91.2 89.4 92.7  PLT 231   < > 218 234 243   < > = values in this interval not displayed.   Lab Results  Component Value Date   TSH 14.75 (H) 10/11/2023   Lab Results  Component Value Date   HGBA1C 5.8 (H) 08/10/2023   Lab Results  Component Value Date   CHOL 163 05/12/2021   HDL 44 05/12/2021   LDLCALC 94 05/12/2021   TRIG 144 05/12/2021   CHOLHDL 3.5 12/26/2019    Significant Diagnostic Results in last 30 days:  No results found.  Assessment/Plan  Hypothyroidism Hypothyroidism, on Levothyroxine  67mcg/100mcg since 08/12/23 while in hospital,  2/2 TSH 0.15 08/10/23. 10/06/23 Increased Levothyroxine  to 100mcg every day po 2/2 TSH 9.73 09/27/23 . TSH 14.75 10/11/23, pending endocrinology eval 3 weeks. Declined adjusting Levothyroxine  dose f/u ED eval 10/24/23 for daytime sleepiness, the patient attributed it to her recent Levothyroxine  dose change  Palpitations Much improved, Propranolol  is effective.   Essential hypertension f/u ED eval 10/24/23 for daytime sleepiness, the patient attributed it to her recent Levothyroxine  dose change, pending endocrinology eval in 3 weeks.   Gastroesophageal reflux disease stable, taking Omeprazole , FDgard helped,  saw GI, EGD 12/26/19 and 05/11/23(dilated, no gross lesions)Vit B12 383 12/02/20, Hgb 13.5 09/01/23  Hyperlipidemia takes Pravastatin , LDL 94  05/12/21  Incontinent of urine Urinary frequency/leakage, 2-3x/night, stopped taking Gemtesa , acupuncture improved initially. Saw urology. treated with Augmentin  04/19/23, urine culture showed no growth 04/19/23, persisted with symptoms. Pending Medtronic bladder stimulator implant.     Family/ staff Communication: Plan of care reviewed with the patient  Labs/tests ordered: Pending TSH

## 2023-10-27 NOTE — Assessment & Plan Note (Signed)
 Much improved, Propranolol  is effective.

## 2023-10-27 NOTE — Patient Instructions (Signed)
 F/u clinic [facility] prn

## 2023-10-27 NOTE — Assessment & Plan Note (Addendum)
 Hypothyroidism, on Levothyroxine  15mcg/100mcg since 08/12/23 while in hospital,  2/2 TSH 0.15 08/10/23. 10/06/23 Increased Levothyroxine  to 100mcg every day po 2/2 TSH 9.73 09/27/23 . TSH 14.75 10/11/23, pending endocrinology eval 3 weeks. Declined adjusting Levothyroxine  dose f/u ED eval 10/24/23 for daytime sleepiness, the patient attributed it to her recent Levothyroxine  dose change

## 2023-10-27 NOTE — Assessment & Plan Note (Signed)
 stable, taking Omeprazole , FDgard helped,  saw GI, EGD 12/26/19 and 05/11/23(dilated, no gross lesions)Vit B12 383 12/02/20, Hgb 13.5 09/01/23

## 2023-10-27 NOTE — Assessment & Plan Note (Signed)
 f/u ED eval 10/24/23 for daytime sleepiness, the patient attributed it to her recent Levothyroxine  dose change, pending endocrinology eval in 3 weeks.

## 2023-10-27 NOTE — Assessment & Plan Note (Signed)
 Urinary frequency/leakage, 2-3x/night, stopped taking Gemtesa , acupuncture improved initially. Saw urology. treated with Augmentin  04/19/23, urine culture showed no growth 04/19/23, persisted with symptoms  Pending Medtronic bladder stimulator implant.

## 2023-10-27 NOTE — Assessment & Plan Note (Signed)
takes Pravastatin, LDL 94 05/12/21 

## 2023-10-28 ENCOUNTER — Encounter: Payer: Self-pay | Admitting: Nurse Practitioner

## 2023-11-19 ENCOUNTER — Other Ambulatory Visit: Payer: Self-pay | Admitting: Nurse Practitioner

## 2023-11-21 ENCOUNTER — Encounter: Payer: Self-pay | Admitting: Radiology

## 2023-11-22 ENCOUNTER — Ambulatory Visit: Admitting: Physician Assistant

## 2023-11-22 ENCOUNTER — Encounter: Payer: Self-pay | Admitting: Physician Assistant

## 2023-11-22 VITALS — BP 112/76 | HR 55 | Ht 63.0 in | Wt 157.0 lb

## 2023-11-22 DIAGNOSIS — R635 Abnormal weight gain: Secondary | ICD-10-CM

## 2023-11-22 DIAGNOSIS — K8020 Calculus of gallbladder without cholecystitis without obstruction: Secondary | ICD-10-CM

## 2023-11-22 DIAGNOSIS — K219 Gastro-esophageal reflux disease without esophagitis: Secondary | ICD-10-CM | POA: Diagnosis not present

## 2023-11-22 NOTE — Patient Instructions (Signed)
 We are referring [facility] to [facility]  They will contact you directly to schedule an appointment.  It may take a week or more before you hear from them.  Please feel free to contact us  if you have not heard from them within 2 weeks and we will follow up on the referral.    _______________________________________________________  If your blood pressure at your visit was 140/90 or greater, please contact your primary care physician to follow up on this.  _______________________________________________________  If you are age 105 or older, your body mass index should be between 23-30. Your Body mass index is 27.81 kg/m. If this is out of the aforementioned range listed, please consider follow up with your Primary Care Provider.  If you are age 85 or younger, your body mass index should be between 19-25. Your Body mass index is 27.81 kg/m. If this is out of the aformentioned range listed, please consider follow up with your Primary Care Provider.   ________________________________________________________  The [facility] providers would like to encourage you to use MYCHART to communicate with providers for non-urgent requests or questions.  Due to long hold times on the telephone, sending your provider a message by [facility] may be a faster and more efficient way to get a response.  Please allow 48 business hours for a response.  Please remember that this is for non-urgent requests.  _______________________________________________________  [facility] is using a team-based approach to care.  Your team is made up of your doctor and two to three APPS. Our APPS (Nurse Practitioners and Physician Assistants) work with your physician to ensure care continuity for you. They are fully qualified to address your health concerns and develop a treatment plan. They communicate directly with your gastroenterologist to care for you. Seeing the Advanced Practice Practitioners on your  physician's team can help you by facilitating care more promptly, often allowing for earlier appointments, access to diagnostic testing, procedures, and other specialty referrals.   Thank you for choosing me and [facility].  Delon Failing, PA-C

## 2023-11-22 NOTE — Progress Notes (Signed)
 Chief Complaint: Follow-up epigastric pain  HPI:     Darlene [name] is an 89 year old female with a past medical history as listed below, including stroke maintained on Plavix , known to Dr. Wilhelmenia, who returns to clinic today for follow-up of epigastric pain.      4/22 barium swallow showed marked dysmotility.    03/22/23 office visit with Alan coombs, PA-C for dysphagia and cough.  At the time scheduled for an EGD and also recommended/scheduled patient for right upper quadrant ultrasound for gallbladder thickening found on a CT, normal LFTs.    05/11/23 EGD with no gross lesions in the esophagus, dilated up to 18 mm, 4 cm hiatal hernia, Z-line regular, erythematous mucosa in the stomach.  Pathology showed chronic inactive gastritis.  Letter sent discussing a possible change in medication symptoms continued.    07/29/2023 patient seen in clinic and at that time discussed that she continues to epigastric discomfort.  Taking Omeprazole  40 twice a day but did not feel like it helped at times.    Today, patient comes in with various health concerns, none of which are truly GI related.  Discusses some ear fullness on the right and following with ENT about this, also complains of a broken tooth recently and then her primary complaint is that being that she feels like she cannot lose weight.  One of her physicians recently put her on some reflux Gourmet which does help with her occasional breakthrough reflux symptoms regardless of her Omeprazole  40 twice daily.  Tells me she tried the Pantoprazole  but did not change anything.  Her primary complaint again is the weight gain.  Tells me she used to live around 135 pounds and just does not seem to build to lose weight anymore.  Denies any further epigastric pain at this moment.    Denies fever, chills or weight loss.  Past Medical History:  Diagnosis Date   Anxiety    Atypical mole 11/11/2020   Left Inner Knee (moderate-free)   Colon polyps    Depression     Hyperlipidemia    Hypertension    Hypothyroidism    Melanoma in situ (HCC) 09/19/2018   Right Arm (excision)   Skin cancer    Sleep apnea    Stroke [facility]) 2008    Past Surgical History:  Procedure Laterality Date   ADENOIDECTOMY     APPENDECTOMY  1960   CATARACT EXTRACTION Bilateral    COLONOSCOPY     ESOPHAGOGASTRODUODENOSCOPY N/A 05/11/2023   Procedure: EGD (ESOPHAGOGASTRODUODENOSCOPY);  Surgeon: Wilhelmenia Aloha Raddle., MD;  Location: [facility];  Service: Gastroenterology;  Laterality: N/A;   GUM SURGERY  03/2019   LUMBAR DISC SURGERY  2014   MELANOMA EXCISION  2020   MESH APPLIED TO LAP PORT     SINUS EXPLORATION     11/17/2021 operation for right nasal cavity and 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   TONSILLECTOMY  1940    Current Outpatient Medications  Medication Sig Dispense Refill   ammonium lactate (AMLACTIN) 12 % cream Apply 1 Application topically as needed for dry skin.     amoxicillin  (AMOXIL ) 500 MG capsule Take 500 mg by mouth 3 (three) times daily.     azelastine  (ASTELIN ) 0.1 % nasal spray Place 2 sprays into both nostrils 2 (two) times daily as needed.     clopidogrel  (PLAVIX ) 75 MG tablet TAKE 1 TABLET BY MOUTH EVERY DAY 90 tablet 1   clotrimazole -betamethasone  (LOTRISONE ) cream Apply 1 Application topically  as needed.     ketoconazole  (NIZORAL ) 2 % shampoo APPLY TOPICALLY 2 TIMES A WEEK 120 mL 1   levothyroxine  (SYNTHROID ) 100 MCG tablet TAKE 1 TABLET BY MOUTH EVERY DAY BEFORE BREAKFAST 90 tablet 1   losartan  (COZAAR ) 50 MG tablet TAKE 1 TABLET BY MOUTH EVERY DAY 90 tablet 25   nitroGLYCERIN  (NITROSTAT ) 0.4 MG SL tablet Place 1 tablet (0.4 mg total) under the tongue every 5 (five) minutes as needed for up to 25 days for chest pain. 25 tablet 3   nystatin  cream (MYCOSTATIN ) Apply 1 Application topically daily.     omeprazole  (PRILOSEC) 40 MG capsule TAKE 1 CAPSULE BY MOUTH 2 (TWO) TIMES DAILY BEFORE A MEAL. 180 capsule 0    oxymetazoline  (AFRIN) 0.05 % nasal spray Place 1 spray into both nostrils as needed for congestion.     pravastatin  (PRAVACHOL ) 40 MG tablet Take 1 tablet (40 mg total) by mouth daily. 90 tablet 3   PRESCRIPTION MEDICATION Take 2-4 mg by mouth every 6 (six) hours as needed (dry mouth). Compounded Pilocarpine 4 mg/ml     propranolol  (INDERAL ) 20 MG tablet Take 20 mg three times a day as needed for palpitations 90 tablet 11   traMADol (ULTRAM) 50 MG tablet Take 50 mg by mouth every 6 (six) hours as needed.     Vitamin D, Ergocalciferol, (DRISDOL) 1.25 MG (50000 UNIT) CAPS capsule TAKE 1 CAPSULE BY MOUTH ONE TIME PER WEEK 12 capsule 3   Zolpidem  Tartrate 3.5 MG SUBL TAKE 1 TABLET BY MOUTH AT BEDTIME AS NEEDED 30 tablet 0   Current Facility-Administered Medications  Medication Dose Route Frequency Provider Last Rate Last Admin   clobetasol  cream (TEMOVATE ) 0.05 %   Topical BID         Allergies as of 11/22/2023 - Review Complete 10/28/2023  Allergen Reaction Noted   Lactose intolerance (gi) Other (See Comments) 06/06/2021   Norco [hydrocodone-acetaminophen ] Nausea And Vomiting 05/17/2019   Dust mite extract Other (See Comments) 09/06/2017   Oxycontin [oxycodone hcl] Nausea And Vomiting 09/06/2017   Pollen extract Other (See Comments) 09/06/2017    Family History  Problem Relation Age of Onset   Lung cancer Mother 8       smoker   Heart disease Mother    Stroke Father 41   Heart attack Father    Allergic rhinitis Sister    Hypertension Sister    CVA Brother    Lung cancer Maternal Grandfather    Heart attack Paternal Grandfather    Melanoma Daughter    Psoriasis Daughter    Rheum arthritis Daughter    Bipolar disorder Daughter    Colon cancer Neg Hx    Esophageal cancer Neg Hx    Inflammatory bowel disease Neg Hx    Liver disease Neg Hx    Pancreatic cancer Neg Hx    Stomach cancer Neg Hx    Rectal cancer Neg Hx     Social History   Socioeconomic History   Marital  status: Widowed    Spouse name: Not on file   Number of children: 3   Years of education: Not on file   Highest education level: Not on file  Occupational History   Occupation: retired chemical engineer  Tobacco Use   Smoking status: Never   Smokeless tobacco: Never  Vaping Use   Vaping status: Never Used  Substance and Sexual Activity   Alcohol use: Not Currently    Comment: hardly ever  Drug use: Never   Sexual activity: Not Currently  Other Topics Concern   Not on file  Social History Narrative   Social History      Diet? ok      Do you drink/eat things with caffeine? Weak coffee, (much milk)      Marital status?          divorced                          What year were you married? 1961      Do you live in a house, apartment, assisted living, condo, trailer, etc.? apartment      Is it one or more stories? 4      How many persons live in your home? Only me      Do you have any pets in your home? (please list) no      Highest level of education completed? 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             yes                         Type & how often? Walk- would love to return to water exercise      Advanced Directives      Do you have a living will? yes      Do you have a DNR form?           no                       If not, do you want to discuss one? no      Do you have signed POA/HPOA for forms? no      Functional Status      Do you have difficulty bathing or dressing yourself?  no      Do you have difficulty preparing food or eating? no      Do you have difficulty managing your medications? no      Do you have difficulty managing your finances?  no      Do you have difficulty affording your medications? no      Social Drivers of Corporate Investment Banker Strain: Not on file  Food Insecurity: No Food Insecurity (08/10/2023)   Hunger Vital Sign    Worried About  Running Out of Food in the Last Year: Never true    Ran Out of Food in the Last Year: Never true  Transportation Needs: No Transportation Needs (08/10/2023)   PRAPARE - Administrator, Civil Service (Medical): No    Lack of Transportation (Non-Medical): No  Physical Activity: Not on file  Stress: Not on file  Social Connections: Unknown (08/10/2023)   Social Connection and Isolation Panel    Frequency of Communication with Friends and Family: More than three times a week    Frequency of Social Gatherings with Friends and Family: More than three times a week    Attends Religious Services: Never    Database Administrator or Organizations: Not on file    Attends Banker Meetings: Not on file    Marital Status: Widowed  Intimate Partner Violence: Not At Risk (08/10/2023)   Humiliation, Afraid, Rape, and Kick questionnaire    Fear of Current  or Ex-Partner: No    Emotionally Abused: No    Physically Abused: No    Sexually Abused: No    Review of Systems:    Constitutional: No weight loss, fever or chills Cardiovascular: No chest pain, chest pressure or palpitations   Respiratory: No SOB or cough Gastrointestinal: See HPI and otherwise negative   Physical Exam:  Vital signs: BP 112/76   Pulse (!) 55   Ht 5' 3 (1.6 m)   Wt 157 lb (71.2 kg)   BMI 27.81 kg/m    Constitutional:   Pleasant elderly Caucasian female appears to be in NAD, Well developed, Well nourished, alert and cooperative Respiratory: Respirations even and unlabored. Lungs clear to auscultation bilaterally.   No wheezes, crackles, or rhonchi.  Cardiovascular: Normal S1, S2. No MRG. Regular rate and rhythm. No peripheral edema, cyanosis or pallor.  Gastrointestinal:  Soft, nondistended, nontender. No rebound or guarding. Normal bowel sounds. No appreciable masses or hepatomegaly. Rectal:  Not performed.  Psychiatric: Demonstrates good judgement and reason without abnormal affect or  behaviors.  RELEVANT LABS AND IMAGING: CBC    Component Value Date/Time   WBC 7.9 09/01/2023 1137   RBC 4.49 09/01/2023 1137   HGB 13.5 09/01/2023 1137   HCT 41.6 09/01/2023 1137   PLT 243 09/01/2023 1137   MCV 92.7 09/01/2023 1137   MCH 30.1 09/01/2023 1137   MCHC 32.5 09/01/2023 1137   RDW 12.8 09/01/2023 1137   LYMPHSABS 1.6 08/10/2023 1144   MONOABS 0.4 08/10/2023 1144   EOSABS 0.1 08/10/2023 1144   BASOSABS 0.0 08/10/2023 1144    CMP     Component Value Date/Time   NA 141 09/01/2023 1137   NA 144 05/12/2021 1153   K 4.3 09/01/2023 1137   CL 109 09/01/2023 1137   CL 105 04/15/2017 0000   CO2 23 09/01/2023 1137   GLUCOSE 134 (H) 09/01/2023 1137   BUN 18 09/01/2023 1137   BUN 14 05/12/2021 1153   CREATININE 0.76 09/01/2023 1137   CREATININE 0.75 04/19/2023 1546   CALCIUM  9.4 09/01/2023 1137   CALCIUM  10.1 04/15/2017 0000   PROT 6.3 (L) 08/11/2023 0246   ALBUMIN 3.5 08/11/2023 0246   AST 19 08/11/2023 0246   ALT 16 08/11/2023 0246   ALKPHOS 55 08/11/2023 0246   BILITOT 0.9 08/11/2023 0246   GFRNONAA >60 09/01/2023 1137   GFRNONAA 68 12/26/2019 1053   GFRAA 78 12/26/2019 1053    Assessment: 1.  Epigastric pain: EGD in April with chronic inactive gastritis, continue with epigastric pain and office visit 07/29/2023 worse with eating, today tells me she is not really having any further epigastric pain, has been using reflux Gourmet as needed which helps for her occasional breakthrough, in the past thought possibly related to gallbladder issues 2.  Gallbladder thickening: On CT chest abdomen pelvis 02/22/23 with normal LFTs, right upper quadrant ultrasound is still not be completed, has known cholelithiasis which could be symptomatic, offered a general surgery consult at last visit but declined 3.  History of CVA on Plavix   Plan: 1.  Today, patient does not really complain of epigastric pain.  She does have occasional throat plain but blames this on her sinus issues and  plans to follow-up with ENT.  She is using reflux Gourmet in addition to her Omeprazole  40 mg twice daily and has no further epigastric pain. 2.  In the future if continues with epigastric pain would recommend that she have her right upper quadrant ultrasound.  We  discussed how possible symptomatic cholelithiasis could be playing a role in her symptoms, but she declines today. 3.  Referred patient to healthy weight and wellness clinic/dietitian given her concerns over inability to lose weight. 4.  Patient to follow in clinic with us  as needed.  Delon Failing, PA-C  Gastroenterology 11/22/2023, 8:29 AM  Cc: [name], [name], NP

## 2023-11-23 NOTE — Progress Notes (Signed)
 Attending Physician's Attestation   I have reviewed the chart.   I agree with the Advanced Practitioner's note, impression, and recommendations with any updates as below.    Corliss Parish, MD [facility] Advanced Endoscopy Office # [phone]

## 2023-11-29 ENCOUNTER — Ambulatory Visit: Payer: Self-pay

## 2023-11-29 ENCOUNTER — Telehealth: Payer: Self-pay | Admitting: Nurse Practitioner

## 2023-11-29 ENCOUNTER — Telehealth: Payer: Self-pay | Admitting: Cardiology

## 2023-11-29 NOTE — Telephone Encounter (Signed)
 Contacted patient and she has an appointment with her cardiologist tomorrow. Patient also wanted to know why was she prescribed Plavix  I informed pt that I can contact [name], [name], NP for further clarification.

## 2023-11-29 NOTE — Telephone Encounter (Signed)
 Patient c/o Palpitations:  STAT if patient reporting lightheadedness, shortness of breath, or chest pain  How long have you had palpitations/irregular HR/ Afib? Are you having the symptoms now? Started back in July, have not gone away   Are you currently experiencing lightheadedness, SOB or CP? No   Do you have a history of afib (atrial fibrillation) or irregular heart rhythm? Yes   Have you checked your BP or HR? (document readings if available): 165/91 138/74 this morning, 158/94 144/81 120/74 yesterday.   Are you experiencing any other symptoms? Headache   Pt has been having palpitations on and off since July. She would like to see Dr. Ladona but is not due until 2/26. Please advise.

## 2023-11-29 NOTE — Telephone Encounter (Signed)
 Spoke with pt regarding her palpitations. Pt stated she is feeling flutters that are more prevalent in the middle of the night and in the morning. Pt stated the flutters are more consistent since her last visit with Dr. Ladona. Pt denies any lightheadedness or dizziness. Pt stated she takes propranolol  which helps some. Pt was sure she has a diagnosis of afib but is only taking Plavix . Dr. Godfrey last office note from 8/14 states that the pt is taking a blood thinner as well as Plavix . Pt is also complaining of headaches that could be due to her blood pressures that are elevated at times. Pt was given ED precautions and scheduled to see DOD tomorrow 11/12. Pt verbalized understanding. All questions if any were answered.

## 2023-11-29 NOTE — Telephone Encounter (Signed)
 Noted thank you

## 2023-11-29 NOTE — Telephone Encounter (Unsigned)
 Copied from CRM [id number]. Topic: Clinical - Medication Refill >> Nov 29, 2023  9:38 AM Latavia C wrote: Medication: Zolpidem  Tartrate 3.5 MG SUBL  Has the patient contacted their pharmacy? No  This is the patient's preferred pharmacy:  [id number] [place], [place] - [address] [place] [address] Phone: [phone] Fax: [phone]  Is this the correct pharmacy for this prescription? Yes If no, delete pharmacy and type the correct one.   Has the prescription been filled recently? No  Is the patient out of the medication? No  Has the patient been seen for an appointment in the last year OR does the patient have an upcoming appointment? Yes  Can we respond through MyChart? Yes  Agent: Please be advised that Rx refills may take up to 3 business days. We ask that you follow-up with your pharmacy.

## 2023-11-29 NOTE — Telephone Encounter (Signed)
 FYI Only or Action Required?: Action required by provider: request for appointment and patient is wanting to be seen at her facility on Thursday if possible. .  Patient was last seen in primary care on 10/27/2023 by [name], [name], NP.  Called Nurse Triage reporting Palpitations.  Symptoms began several months ago.  Interventions attempted: Prescription medications: Propanolol and Rest, hydration, or home remedies.  Symptoms are: unchanged.  Triage Disposition: See PCP When Office is Open (Within 3 Days)  Patient/caregiver understands and will follow disposition?: No, wishes to speak with PCP  Reason for Triage: Patient states she has noticed an increase in heart palpitations and wants to know if the following medication may be to blame: propranolol  (INDERAL ) 20 MG tablet. Patient can be reached at [phone]    Reason for Disposition  History of hyperthyroidism or taking thyroid  medication  Answer Assessment - Initial Assessment Questions Patient reports palpitations that has been going on since July. Patient states the palpitations are intermittent. Patient has been taking propranolol  PRN for palpitations. Patient states palpitations occur at different times-lasting 10-15 minutes at a time. Patient is wanting to know if she could be seen on Thursday at her facility by PCP. Patient is asking for a call back. Patient is recommended to call her cardiology office today to see if she can be seen as well.   1. DESCRIPTION: Please describe your heart rate or heartbeat that you are having (e.g., fast/slow, regular/irregular, skipped or extra beats, palpitations)     Palpitations 2. ONSET: When did it start? (e.g., minutes, hours, days)      Patient states palpitations have been going on since July.  3. DURATION: How long does it last (e.g., seconds, minutes, hours)     10-15 minutes 4. PATTERN Does it come and go, or has it been constant since it started?  Does it get worse with  exertion?   Are you feeling it now?     Comes and goes 5. TAP: Using your hand, can you tap out what you are feeling on a chair or table in front of you, so that I can hear? Note: Not all patients can do this.       Patient is unable to do this 6. HEART RATE: Can you tell me your heart rate? How many beats in 15 seconds?  Note: Not all patients can do this.       Unable to do this 7. RECURRENT SYMPTOM: Have you ever had this before? If Yes, ask: When was the last time? and What happened that time?      yes 8. CAUSE: What do you think is causing the palpitations?     unsure 9. CARDIAC HISTORY: Do you have any history of heart disease? (e.g., heart attack, angina, bypass surgery, angioplasty, arrhythmia)      yes 10. OTHER SYMPTOMS: Do you have any other symptoms? (e.g., dizziness, chest pain, sweating, difficulty breathing)       headache  Protocols used: Heart Rate and Heartbeat Questions-A-AH

## 2023-11-30 ENCOUNTER — Ambulatory Visit
Attending: Student in an Organized Health Care Education/Training Program | Admitting: Student in an Organized Health Care Education/Training Program

## 2023-11-30 ENCOUNTER — Ambulatory Visit

## 2023-11-30 VITALS — BP 117/75 | HR 50 | Ht 63.0 in | Wt 158.0 lb

## 2023-11-30 DIAGNOSIS — I10 Essential (primary) hypertension: Secondary | ICD-10-CM

## 2023-11-30 DIAGNOSIS — R002 Palpitations: Secondary | ICD-10-CM | POA: Diagnosis not present

## 2023-11-30 DIAGNOSIS — I201 Angina pectoris with documented spasm: Secondary | ICD-10-CM

## 2023-11-30 DIAGNOSIS — E7800 Pure hypercholesterolemia, unspecified: Secondary | ICD-10-CM

## 2023-11-30 NOTE — Progress Notes (Signed)
 Cardiology Office Note:   Date:  11/30/2023  ID:  Darlene, [name] 08-Apr-1933, MRN [id number] PCP: [name], [name], NP  [facility] Providers Cardiologist:  Gordy Bergamo, MD { Chief Complaint:  Chief Complaint  Patient presents with   Palpitations      History of Present Illness:   Darlene [name] is a 89 y.o. female with a PMH of HTN, HLD, prediabetes, OSA, prior CVA, esophageal stricture w/ GERD and anxiety who presents as an acute visit for palpitations.  Patient has longstanding issues with intermittent chest pain and palpitations.  Coronary CTA, echocardiograms, NM stress and heart monitors of all been negative (all last performed in 2022).  The patient states that more recently she has had ongoing palpitations where it wakes her up from her sleep in the middle of the night.  These palpitations are fleeting but occur almost daily.  No other associated symptoms.  She takes her propranolol  as prescribed for palpitations which improve her symptoms.  She has no other complaints.  Past Medical History:  Diagnosis Date   Anxiety    Atypical mole 11/11/2020   Left Inner Knee (moderate-free)   Colon polyps    Depression    Hyperlipidemia    Hypertension    Hypothyroidism    Melanoma in situ (HCC) 09/19/2018   Right Arm (excision)   Skin cancer    Sleep apnea    Stroke [facility]) 2008     Studies Reviewed:    EKG:  EKG Interpretation Date/Time:  Wednesday November 30 2023 11:25:05 EST Ventricular Rate:  50 PR Interval:  192 QRS Duration:  70 QT Interval:  434 QTC Calculation: 395 R Axis:   33  Text Interpretation: Sinus bradycardia Low voltage QRS When compared with ECG of 01-Sep-2023 11:33, PREVIOUS ECG IS PRESENT Confirmed by Darlene [name] [phone]) on 11/30/2023 11:42:09 AM     Cardiac Studies & Procedures   ______________________________________________________________________________________________   STRESS TESTS  PCV MYOCARDIAL PERFUSION WO  LEXISCAN 04/14/2020  Narrative Exercise tetrofosmin stress test  04/14/2020: Normal ECG stress. The patient exercised for 5 minutes and 0 seconds of a Bruce protocol, achieving approximately 7.03 METs.  Normal BP response. Reduced exercise tolerance. Non-limiting chest pain with exercise. Myocardial perfusion is normal. Overall LV systolic function is normal without regional wall motion abnormalities. Stress LV EF: 63%. No previous exam available for comparison. Low risk.   ECHOCARDIOGRAM  PCV ECHOCARDIOGRAM COMPLETE 04/16/2020  Narrative Echocardiogram 04/16/2020: Normal LV systolic function with visual EF 60-65%. Left ventricle cavity is normal in size. Normal global wall motion. Normal diastolic filling pattern, normal LAP. Mild tricuspid regurgitation. Compared to prior study dated 04/24/2019: no significant change.    MONITORS  LONG TERM MONITOR-LIVE TELEMETRY (3-14 DAYS) 04/24/2019  Narrative Remote outpatient cardiac telemetry 04/24/2019 through 05/08/2019: Predominant rhythm is normal sinus rhythm.  Minimum heart rate 49, average heart rate 72 and maximum heart rate 195 bpm. PVC burden <1%.  There were 0 ventricular couplets and 0 ventricular runs. PAC burden <1%.  There was 1 supraventricular run of 5 beats.  No atrial fibrillation.  No heart block. 12 patient activated events correlated with sinus rhythm.   CT SCANS  CT CORONARY MORPH W/CTA COR W/SCORE 09/01/2020  Addendum 09/03/2020  5:04 PM ADDENDUM REPORT: 09/03/2020 17:01  HISTORY: Chest pain/anginal equiv, ECGs and troponins normal  EXAM: Cardiac/Coronary  CT  TECHNIQUE: The patient was scanned on a [employer].  PROTOCOL: A 120 kV prospective scan was triggered in the  descending thoracic aorta at 111 HU's. Axial non-contrast 3 mm slices were carried out through the heart. The data set was analyzed on a dedicated work station and scored using the Agatson method. Gantry rotation speed was 250 msecs  and collimation was .6 mm. No IV beta blockade but 0.8 mg of sl NTG was given. The 3D data set was reconstructed in 5% intervals of the 67-82 % of the R-R cycle. Diastolic phases were analyzed on a dedicated work station using MPR, MIP and VRT modes. The patient received 95mL OMNIPAQUE  IOHEXOL  350 MG/ML SOLN, 80mL OMNIPAQUE  IOHEXOL  350 MG/ML SOLN of contrast.  FINDINGS: Image quality: Excellent.  Artifact: Limited.  Total coronary calcium  score of 0 AU.  Coronary arteries: Normal coronary origins.  Left dominance.  Left Main Coronary Artery: The left main is a normal caliber vessel with a normal take off from the left coronary cusp that bifurcates to form a left anterior descending artery and a left circumflex artery. There is no plaque or stenosis.  Left Anterior Descending Coronary Artery: Normal caliber vessel, wraps the apex, gives off 1 patent diagonal branches. The LAD and first diagonal branch is patent without evidence of plaque or stenosis.  Left Circumflex Artery: Normal caliber vessel, dominant, travels within the atrioventricular groove, gives off 4 patent obtuse marginal branches, and terminates as a PDA and posterolateral branch. The LCX and obtuse marginal branches are patent with no evidence of plaque or stenosis.  Right Coronary Artery: The RCA is non-dominant with normal take off from the right coronary cusp without evidence of plaque or stenosis.  Left Atrium: Grossly normal in size with no left atrial appendage filling defect.  Left Ventricle: Grossly normal in size. There are no stigmata of prior infarction. There is no abnormal filling defect.  Pulmonary arteries: Normal in size without proximal filling defect.  Pulmonary veins: Normal pulmonary venous drainage.  Aorta: Normal size, 30mm mm at the mid ascending aorta (level of the right PA ) measured double oblique. Aortic atherosclerosis. No dissection.  Pericardium: Normal thickness with no  significant effusion or calcium  present.  Cardiac valves: The aortic valve is trileaflet without calcification. The mitral valve is normal structure with mitral annular calcification.  Extra-cardiac findings: See attached radiology report for non-cardiac structures.  IMPRESSION: 1. Total coronary calcium  score of 0 AU.  2. Normal coronary origin with left dominance.  3. Aortic atherosclerosis.  4. CAD-RADS 0: No evidence of epicardial coronary artery disease (0%).  RECOMMENDATIONS:  1. Consider non-atherosclerotic causes of chest pain.   Electronically Signed By: Madonna Large D.O. On: 09/03/2020 17:01  Narrative EXAM: OVER-READ INTERPRETATION  CT CHEST  The following report is an over-read performed by radiologist Dr. Rockey Kilts of [facility] on 09/01/2020. This over-read does not include interpretation of cardiac or coronary anatomy or pathology. The coronary CTA interpretation by the cardiologist is attached.  COMPARISON:  Chest radiograph 08/29/2020. No prior CT.  FINDINGS: Vascular: Aortic atherosclerosis. No central pulmonary embolism, on this non-dedicated study.  Mediastinum/Nodes: No imaged thoracic adenopathy. Small to moderate hiatal hernia.  Lungs/Pleura: No pleural fluid.  Clear imaged lungs.  Upper Abdomen: Normal imaged portions of the liver, spleen.  Musculoskeletal: No acute osseous abnormality.  IMPRESSION: 1. No acute findings in the imaged extracardiac chest. 2. Small to moderate hiatal hernia. 3. Aortic Atherosclerosis (ICD10-I70.0).  Electronically Signed: By: Rockey Kilts M.D. On: 09/01/2020 13:49     ______________________________________________________________________________________________      Risk Assessment/Calculations:  Physical Exam:     VS:  BP 117/75   Pulse (!) 50   Ht 5' 3 (1.6 m)   Wt 158 lb (71.7 kg)   SpO2 96%   BMI 27.99 kg/m      Wt Readings from Last 3 Encounters:   11/22/23 157 lb (71.2 kg)  10/27/23 156 lb 9.6 oz (71 kg)  09/22/23 159 lb 3.2 oz (72.2 kg)     GEN: Well nourished, well developed, in no acute distress NECK: No JVD; No carotid bruits CARDIAC: RRR, no murmurs, rubs, gallops RESPIRATORY:  Clear to auscultation without rales, wheezing or rhonchi  ABDOMEN: Soft, non-tender, non-distended, normal bowel sounds EXTREMITIES:  Warm and well perfused, no edema; No deformity, 2+ radial pulses PSYCH: Normal mood and affect   Assessment & Plan Palpitation - Patient with palpitations that are largely chronic but possibly more frequent in recent time. - Last heart monitor in 2021 was unremarkable. - I think is reasonable to check another heart monitor for 1 week to ensure that she is not having any new arrhythmias. - Follow-up with Dr. Ladona in 3 months Heart monitor x1 week Follow with Dr. Ladona in 3 months          This note was written with the assistance of a dictation microphone or AI dictation software. Please excuse any typos or grammatical errors.   Signed, Georganna Archer, MD 11/30/2023 7:55 AM    [facility]

## 2023-11-30 NOTE — Progress Notes (Unsigned)
 ZIO serial # [id number] from office inventory applied to patient.  Dr. Floretta to read.

## 2023-11-30 NOTE — Patient Instructions (Signed)
 Medication Instructions:  Your physician recommends that you continue on your current medications as directed. Please refer to the Current Medication list given to you today.   *If you need a refill on your cardiac medications before your next appointment, please call your pharmacy*  Lab Work: none If you have labs (blood work) drawn today and your tests are completely normal, you will receive your results only by: MyChart Message (if you have MyChart) OR A paper copy in the mail If you have any lab test that is abnormal or we need to change your treatment, we will call you to review the results.  Testing/Procedures: Dr Floretta would like you to wear a zio monitor for one week   Follow-Up: At [facility], you and your health needs are our priority.  As part of our continuing mission to provide you with exceptional heart care, our providers are all part of one team.  This team includes your primary Cardiologist (physician) and Advanced Practice Providers or APPs (Physician Assistants and Nurse Practitioners) who all work together to provide you with the care you need, when you need it.  Your next appointment:   3 month(s)  Provider:   Gordy Bergamo, MD    We recommend signing up for the patient portal called MyChart.  Sign up information is provided on this After Visit Summary.  MyChart is used to connect with patients for Virtual Visits (Telemedicine).  Patients are able to view lab/test results, encounter notes, upcoming appointments, etc.  Non-urgent messages can be sent to your provider as well.   To learn more about what you can do with MyChart, go to [email or web address].   Other Instructions ZIO XT- Long Term Monitor Instructions  Your physician has requested you wear a ZIO patch monitor for 7 days.  This is a single patch monitor. [employer] supplies one patch monitor per enrollment. Additional stickers are not available. Please do not apply patch if you will be  having a Nuclear Stress Test,  Echocardiogram, Cardiac CT, MRI, or Chest Xray during the period you would be wearing the  monitor. The patch cannot be worn during these tests. You cannot remove and re-apply the  ZIO XT patch monitor.  Your ZIO patch monitor will be mailed 3 day USPS to your address on file. It may take 3-5 days  to receive your monitor after you have been enrolled.  Once you have received your monitor, please review the enclosed instructions. Your monitor  has already been registered assigning a specific monitor serial # to you.  Billing and Patient Assistance Program Information  We have supplied [employer] with any of your insurance information on file for billing purposes. [employer] offers a sliding scale Patient Assistance Program for patients that do not have  insurance, or whose insurance does not completely cover the cost of the ZIO monitor.  You must apply for the Patient Assistance Program to qualify for this discounted rate.  To apply, please call [employer] at [phone], select option 4, select option 2, ask to apply for  Patient Assistance Program. Meredeth will ask your household income, and how many people  are in your household. They will quote your out-of-pocket cost based on that information.  [employer] will also be able to set up a 42-month, interest-free payment plan if needed.  Applying the monitor   Shave hair from upper left chest.  Hold abrader disc by orange tab. Rub abrader in 40 strokes over the upper left chest as  indicated in  your monitor instructions.  Clean area with 4 enclosed alcohol pads. Let dry.  Apply patch as indicated in monitor instructions. Patch will be placed under collarbone on left  side of chest with arrow pointing upward.  Rub patch adhesive wings for 2 minutes. Remove white label marked 1. Remove the white  label marked 2. Rub patch adhesive wings for 2 additional minutes.  While looking in a mirror, press and release button  in center of patch. A small green light will  flash 3-4 times. This will be your only indicator that the monitor has been turned on.  Do not shower for the first 24 hours. You may shower after the first 24 hours.  Press the button if you feel a symptom. You will hear a small click. Record Date, Time and  Symptom in the Patient Logbook.  When you are ready to remove the patch, follow instructions on the last 2 pages of Patient  Logbook. Stick patch monitor onto the last page of Patient Logbook.  Place Patient Logbook in the blue and white box. Use locking tab on box and tape box closed  securely. The blue and white box has prepaid postage on it. Please place it in the mailbox as  soon as possible. Your physician should have your test results approximately 7 days after the  monitor has been mailed back to [facility]  Call [facility] Customer Care at [phone] if you have questions regarding  your ZIO XT patch monitor. Call them immediately if you see an orange light blinking on your  monitor.  If your monitor falls off in less than 4 days, contact our Monitor department at [phone].  If your monitor becomes loose or falls off after 4 days call [employer] at [phone] for  suggestions on securing your monitor

## 2023-12-04 ENCOUNTER — Other Ambulatory Visit: Payer: Self-pay | Admitting: Nurse Practitioner

## 2023-12-04 DIAGNOSIS — K219 Gastro-esophageal reflux disease without esophagitis: Secondary | ICD-10-CM

## 2023-12-04 DIAGNOSIS — F411 Generalized anxiety disorder: Secondary | ICD-10-CM

## 2023-12-05 MED ORDER — ZOLPIDEM TARTRATE 3.5 MG SL SUBL: SUBLINGUAL_TABLET | SUBLINGUAL | 0 refills | Status: AC

## 2023-12-05 NOTE — Telephone Encounter (Signed)
 Duplicate request

## 2023-12-05 NOTE — Telephone Encounter (Signed)
 Last refill request was DENIED stating it was a Duplicate request but the last RF in Epic for Zolpidem  is 09/20/2023 No Contract on File.   Pended Rx and sent to [employer] for approval.

## 2023-12-05 NOTE — Telephone Encounter (Signed)
 Patient is requesting a refill of the following medications: Requested Prescriptions   Pending Prescriptions Disp Refills   Zolpidem  Tartrate 3.5 MG SUBL [Pharmacy Med Name: ZOLPIDEM  TART 3.5 MG TABLET SL] 30 tablet 0    Sig: TAKE 1 TABLET BY MOUTH EVERY DAY AT BEDTIME AS NEEDED    Date of last approval: 09/20/23  Refill amount: 30  Treatment agreement date: Not on file and no pending appointment with PCP

## 2023-12-05 NOTE — Telephone Encounter (Signed)
 Copied from CRM #[id number]. Topic: Clinical - Prescription Issue >> Dec 05, 2023  9:51 AM DeAngela L wrote: Reason for CRM: patient called in prescription refill on 11/29/23 for Zolpidem  Tartrate 3.5 MG SUBL and after speaking with her  pharmacy yesterday she was told they have not had a refill sent over  Patient has 1 pill left and she states her pharmacy usually has to order the prescription since they don't keep it in stock and this will cause a delay and the is really concern and would like the refill to sent soon as possible   Pt num [phone] (M)  [id number] [address], [place] - [address] [place] [address] Phone: [phone] Fax: [phone]

## 2023-12-06 NOTE — Telephone Encounter (Signed)
 Copied from CRM #[id number]. Topic: Clinical - Prescription Issue >> Dec 05, 2023  9:51 AM DeAngela L wrote: Reason for CRM: patient called in prescription refill on 11/29/23 for Zolpidem  Tartrate 3.5 MG SUBL and after speaking with her  pharmacy yesterday she was told they have not had a refill sent over  Patient has 1 pill left and she states her pharmacy usually has to order the prescription since they don't keep it in stock and this will cause a delay and the is really concern and would like the refill to sent soon as possible   Pt num [phone] [facility])  [id number] [address], [place] - [address] [place] [address] Phone: [phone] Fax: [phone] >> Dec 06, 2023 11:26 AM Miquel SAILOR wrote: PT calling on update for medication. Called office but none available. PT needs call back on update. [phone] preferred after 1pm

## 2023-12-06 NOTE — Telephone Encounter (Signed)
 Medication was sent to pharmacy with confirmation yesterday 11/17  Zolpidem  Tartrate 3.5 MG SUBL 30 tablet 0 12/05/2023 --   Sig: Take one by mouth at bedtime as needed.   Sent to pharmacy as: Zolpidem  Tartrate 3.5 MG SL Tab   Notes to Pharmacy: Not to exceed 5 additional fills before 03/18/2024 DX Code Needed  .   E-Prescribing Status: Receipt confirmed by pharmacy (12/05/2023 11:35 AM EST)     Patient Notified and agreed and will call pharmacy.

## 2023-12-09 ENCOUNTER — Other Ambulatory Visit (HOSPITAL_COMMUNITY): Payer: Self-pay

## 2023-12-10 DIAGNOSIS — R002 Palpitations: Secondary | ICD-10-CM | POA: Diagnosis not present

## 2023-12-12 ENCOUNTER — Ambulatory Visit: Payer: Self-pay | Admitting: Student in an Organized Health Care Education/Training Program

## 2023-12-30 ENCOUNTER — Encounter: Payer: Self-pay | Admitting: Family

## 2023-12-30 ENCOUNTER — Ambulatory Visit (INDEPENDENT_AMBULATORY_CARE_PROVIDER_SITE_OTHER): Admitting: Family

## 2023-12-30 VITALS — BP 140/70 | HR 65 | Temp 96.4°F | Ht 63.0 in | Wt 157.8 lb

## 2023-12-30 DIAGNOSIS — W19XXXD Unspecified fall, subsequent encounter: Secondary | ICD-10-CM

## 2023-12-30 DIAGNOSIS — Z4802 Encounter for removal of sutures: Secondary | ICD-10-CM

## 2023-12-30 NOTE — Patient Instructions (Signed)
 Continue to clean right knee with saline,pat and cover with gauze daily.Notify provider for any redness,swelling or drainage or running any fever or chills.

## 2023-12-30 NOTE — Progress Notes (Unsigned)
 Provider: Roxan Plough FNP-C  [name], [name], NP  Patient Care Team: [name], [name], NP as PCP - General (Internal Medicine) Ladona Heinz, MD as PCP - Cardiology (Cardiology) Clark-Burning, Delon, PA-C (Inactive) (Dermatology) Ladona Heinz, MD as Consulting Physician (Cardiology) Mansouraty, Aloha Raddle., MD as Consulting Physician (Gastroenterology) Ethyl Lonni BRAVO, MD (Inactive) as Consulting Physician (Otolaryngology) Porter Andrez SAUNDERS, PA-C (Inactive) as Physician Assistant (Dermatology) Plonk, Bard Gander, MD as Referring Physician (Otolaryngology)  Extended Emergency Contact Information Primary Emergency Contact: Okey Ronal Pulling Home Phone: [phone] Relation: Friend Secondary Emergency Contact: Levins,Jay [place] Home Phone: [phone] Mobile Phone: [phone] Relation: Son  Code Status:   Goals of care: Advanced Directive information    09/01/2023   11:26 AM  Advanced Directives  Does Patient Have a Medical Advance Directive? Yes  Type of Estate Agent of [place];Living will     Chief Complaint  Patient presents with   Suture / Staple Removal    HPI:  Pt is a 89 y.o. female seen today for an acute visit for removal of stitches on right knee post fall over Thanksgiving while visiting the son in [place] .States tripped while walking with the son.she denies any fever,chills or drainage. Has been changing the gauze every day. States no pain on the knee.   Past Medical History:  Diagnosis Date   Anxiety    Atypical mole 11/11/2020   Left Inner Knee (moderate-free)   Colon polyps    Depression    Hyperlipidemia    Hypertension    Hypothyroidism    Melanoma in situ (HCC) 09/19/2018   Right Arm (excision)   Skin cancer    Sleep apnea    Stroke [facility]) 2008   Past Surgical History:  Procedure Laterality Date   ADENOIDECTOMY     APPENDECTOMY  1960   CATARACT EXTRACTION Bilateral     COLONOSCOPY     ESOPHAGOGASTRODUODENOSCOPY N/A 05/11/2023   Procedure: EGD (ESOPHAGOGASTRODUODENOSCOPY);  Surgeon: Wilhelmenia Aloha Raddle., MD;  Location: [facility];  Service: Gastroenterology;  Laterality: N/A;   GUM SURGERY  03/2019   LUMBAR DISC SURGERY  2014   MELANOMA EXCISION  2020   MESH APPLIED TO LAP PORT     SINUS EXPLORATION     11/17/2021 operation for right nasal cavity and 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   TONSILLECTOMY  1940    Allergies[1]  Outpatient Encounter Medications as of 12/30/2023  Medication Sig   ammonium lactate (AMLACTIN) 12 % cream Apply 1 Application topically as needed for dry skin.   azelastine  (ASTELIN ) 0.1 % nasal spray Place 2 sprays into both nostrils 2 (two) times daily as needed.   clopidogrel  (PLAVIX ) 75 MG tablet TAKE 1 TABLET BY MOUTH EVERY DAY   clotrimazole -betamethasone  (LOTRISONE ) cream Apply 1 Application topically as needed.   ketoconazole  (NIZORAL ) 2 % shampoo APPLY TOPICALLY 2 TIMES A WEEK   levothyroxine  (SYNTHROID ) 100 MCG tablet TAKE 1 TABLET BY MOUTH EVERY DAY BEFORE BREAKFAST   losartan  (COZAAR ) 50 MG tablet TAKE 1 TABLET BY MOUTH EVERY DAY   nitroGLYCERIN  (NITROSTAT ) 0.4 MG SL tablet Place 1 tablet (0.4 mg total) under the tongue every 5 (five) minutes as needed for up to 25 days for chest pain.   nystatin  cream (MYCOSTATIN ) Apply 1 Application topically daily.   omeprazole  (PRILOSEC) 40 MG capsule TAKE 1 CAPSULE BY MOUTH 2 (TWO) TIMES DAILY BEFORE A MEAL.   oxymetazoline  (AFRIN) 0.05 % nasal spray Place 1 spray  into both nostrils as needed for congestion.   pravastatin  (PRAVACHOL ) 40 MG tablet Take 1 tablet (40 mg total) by mouth daily.   PRESCRIPTION MEDICATION Take 2-4 mg by mouth every 6 (six) hours as needed (dry mouth). Compounded Pilocarpine 4 mg/ml   propranolol  (INDERAL ) 20 MG tablet Take 20 mg three times a day as needed for palpitations   traMADol (ULTRAM) 50 MG  tablet Take 50 mg by mouth every 6 (six) hours as needed.   Vitamin D, Ergocalciferol, (DRISDOL) 1.25 MG (50000 UNIT) CAPS capsule TAKE 1 CAPSULE BY MOUTH ONE TIME PER WEEK   Zolpidem  Tartrate 3.5 MG SUBL Take one by mouth at bedtime as needed.   Facility-Administered Encounter Medications as of 12/30/2023  Medication   clobetasol  cream (TEMOVATE ) 0.05 %    Review of Systems  Constitutional:  Negative for appetite change, chills, fatigue, fever and unexpected weight change.  Respiratory:  Negative for cough, chest tightness, shortness of breath and wheezing.   Cardiovascular:  Negative for chest pain, palpitations and leg swelling.  Musculoskeletal:  Negative for arthralgias, back pain, gait problem and joint swelling.  Skin:  Negative for color change, pallor, rash and wound.       Right knee stitches  Neurological:  Negative for dizziness, weakness, light-headedness, numbness and headaches.  Hematological:  Does not bruise/bleed easily.    Immunization History  Administered Date(s) Administered   Hepatitis A 04/04/2000   Influenza-Unspecified 10/18/2014   Moderna Sars-Covid-2 Vaccination 01/22/2019, 02/19/2019, 06/05/2020   Pneumococcal Conjugate-13 12/20/2018   Pneumococcal Polysaccharide-23 01/18/2005   Tdap 11/25/2008   Zoster Recombinant(Shingrix) 12/20/2018, 01/20/2022, 09/15/2022   Pertinent  Health Maintenance Due  Topic Date Due   Colonoscopy  10/25/2021   Influenza Vaccine  08/19/2023   Bone Density Scan  Completed      12/09/2022    3:27 PM 12/30/2022   12:59 PM 04/19/2023    3:14 PM 05/19/2023    1:44 PM 12/30/2023    9:23 AM  Fall Risk  Falls in the past year? 0 0 0 0 1  Was there an injury with Fall? 0  0  0  0  1  Fall Risk Category Calculator 0 0 0 0 2  Patient at Risk for Falls Due to   No Fall Risks No Fall Risks No Fall Risks  Fall risk Follow up   Falls evaluation completed Falls evaluation completed Falls evaluation completed     Data  saved with a previous flowsheet row definition   Functional Status Survey:    Vitals:   12/30/23 0840 12/30/23 0928  BP: (!) 144/64 (!) 140/70  Pulse: 65   Temp: (!) 96.4 F (35.8 C)   SpO2: 97%   Weight: 157 lb 12.8 oz (71.6 kg)   Height: 5' 3 (1.6 m)    Body mass index is 27.95 kg/m. Physical Exam Physical Exam          Labs reviewed: Recent Labs    08/10/23 1143 08/10/23 1144 08/11/23 0246 08/12/23 0254 09/01/23 1137  NA  --  146* 143  --  141  K  --  2.7* 4.3  --  4.3  CL  --  122* 111  --  109  CO2  --  17* 22  --  23  GLUCOSE  --  84 97  --  134*  BUN  --  9 11  --  18  CREATININE  --  0.47 0.73  --  0.76  CALCIUM   --  6.5* 9.5  --  9.4  MG 1.5*  --   --  2.4  --    Recent Labs    05/06/23 1114 08/10/23 1144 08/11/23 0246  AST 16 13* 19  ALT 14 12 16   ALKPHOS 60 39 55  BILITOT 0.7 0.6 0.9  PROT 6.3* 4.2* 6.3*  ALBUMIN 3.6 2.4* 3.5   Recent Labs    05/06/23 1114 07/22/23 1605 08/10/23 1144 08/11/23 0246 09/01/23 1137  WBC 5.6   < > 5.8 5.5 7.9  NEUTROABS 3.0  --  3.6  --   --   HGB 13.8   < > 13.5 13.8 13.5  HCT 42.9   < > 41.3 41.2 41.6  MCV 92.1   < > 91.2 89.4 92.7  PLT 231   < > 218 234 243   < > = values in this interval not displayed.   Lab Results  Component Value Date   TSH 14.75 (H) 10/11/2023   Lab Results  Component Value Date   HGBA1C 5.8 (H) 08/10/2023   Lab Results  Component Value Date   CHOL 163 05/12/2021   HDL 44 05/12/2021   LDLCALC 94 05/12/2021   TRIG 144 05/12/2021   CHOLHDL 3.5 12/26/2019    Significant Diagnostic Results in last 30 days:  LONG TERM MONITOR (3-14 DAYS) Result Date: 12/10/2023 Patch Wear Time:  4 days and 21 hours (2025-11-12T12:20:17-0500 to 2025-11-17T09:28:38-0500) Patient had a min HR of 39 bpm, max HR of 167 bpm, and avg HR of 56 bpm. Predominant underlying rhythm was Sinus Rhythm. 37 Supraventricular Tachycardia runs occurred, the run with the fastest interval lasting 6 beats with  a max rate of 167 bpm, the longest lasting 15.4 secs with an avg rate of 108 bpm. Isolated SVEs were rare (<1.0%), SVE Couplets were rare (<1.0%), and SVE Triplets were rare (<1.0%). Isolated VEs were rare (<1.0%), and no VE Couplets or VE Triplets were present. Details: Patch Wear Time:  4 days and 21 hours (2025-11-12T12:20:17-0500 to 2025-11-17T09:28:38-0500). Patient had a min HR of 39 bpm, max HR of 167 bpm, and avg HR of 56 bpm. Predominant Rhythm: Sinus bradycardia Arrhythmias: There were 37 episodes of supraventricular tachycardia (SVT). The longest episode lasted for 15.4 seconds. The fastest episode was 167 bpm. Ectopic Beats: There were rare premature atrial contractions (<1%) and rare premature ventricular contractions (<1%) Patient Triggered Events: There were 5 patient triggered events. Only 1 of the 5 patient triggered events occurred within close proximity to an SVT event. The other 4 events occurred during normal sinus rhythm.  Impressions: Predominant rhythm was sinus bradycardia Ectopic beats were rare and within normal limits. Fairly frequent episodes of supraventricular tachycardia that did not always correspond to patient symptoms. Lonnie T. Floretta HEATH, MD [place]  [facility] 12/10/2023 11:46 AM      Assessment/Plan Assessment and Plan                Family/ staff Communication: Reviewed plan of care with patient  Labs/tests ordered: None   Next Appointment: No follow-ups on file.   Total time: minutes. Greater than 50% of total time spent doing patient education regarding T2DM,HTN, HLD,chronic back pain,health maintenance including symptom/medication management.   Milburn Freeney C Galilea Quito, NP      [1] Allergies Allergen Reactions   Lactose Intolerance (Gi) Other (See Comments)    indigestion   Norco [Hydrocodone-Acetaminophen ] Nausea And Vomiting   Dust Mite Extract Other (See Comments)    Sinus drainage  Oxycontin [Oxycodone Hcl] Nausea And Vomiting    Pollen Extract Other (See Comments)    Sinus drainage

## 2024-01-04 ENCOUNTER — Ambulatory Visit: Admitting: Family

## 2024-01-04 ENCOUNTER — Other Ambulatory Visit: Payer: Self-pay | Admitting: Nurse Practitioner

## 2024-01-04 ENCOUNTER — Encounter: Payer: Self-pay | Admitting: Family

## 2024-01-04 VITALS — BP 118/58 | HR 75 | Temp 97.2°F | Ht 63.0 in | Wt 158.4 lb

## 2024-01-04 DIAGNOSIS — Z4802 Encounter for removal of sutures: Secondary | ICD-10-CM | POA: Diagnosis not present

## 2024-01-04 NOTE — Patient Instructions (Signed)
 cleanse right knee with saline ,pat dry, apply triple antibiotic ointment and covered with foam dressing for extra protection and absorption.change dressing x 3 days then leave to open air.Notify provider for any redness,fever,chills or drainage.

## 2024-01-04 NOTE — Telephone Encounter (Signed)
 High risk warning populated when attempting to refill medications, will send to provider for pending  review and approval

## 2024-01-04 NOTE — Progress Notes (Signed)
 "  Provider: Roxan Stacy Deshler FNP-C  [name], [name], NP  Patient Care Team: [name], [name], NP as PCP - General (Internal Medicine) Ladona Heinz, MD as PCP - Cardiology (Cardiology) Clark-Burning, Delon, PA-C (Inactive) (Dermatology) Ladona Heinz, MD as Consulting Physician (Cardiology) Mansouraty, Aloha Raddle., MD as Consulting Physician (Gastroenterology) Ethyl Lonni BRAVO, MD (Inactive) as Consulting Physician (Otolaryngology) Porter Andrez SAUNDERS, PA-C (Inactive) as Physician Assistant (Dermatology) Plonk, Bard Gander, MD as Referring Physician (Otolaryngology)  Extended Emergency Contact Information Primary Emergency Contact: Okey Ronal Pulling Home Phone: [phone] Relation: Friend Secondary Emergency Contact: Margaretann Heinz [place] Home Phone: [phone] Mobile Phone: [phone] Relation: Son  Code Status:  Full Code  Goals of care: Advanced Directive information    09/01/2023   11:26 AM  Advanced Directives  Does Patient Have a Medical Advance Directive? Yes  Type of Estate Agent of [place];Living will     Chief Complaint  Patient presents with   Suture / Staple Removal    2 remaining located on knee    HPI:  Pt is a 89 y.o. female seen today for an acute visit for right knee stitches removal. She was here on 12/30/2023 for right knee 19 stiches removal.17 stitches removed but had two more that area was not completely healed and was tender. She was advised to follow-up today to remove remaining stitches.She denies any redness, swelling or drainage from right knee. States it has healed.  Also denies any fever or chills.  Has been cleaning right knee and covering with a Band-Aid.  Past Medical History:  Diagnosis Date   Anxiety    Atypical mole 11/11/2020   Left Inner Knee (moderate-free)   Colon polyps    Depression    Hyperlipidemia    Hypertension    Hypothyroidism    Melanoma in situ (HCC) 09/19/2018   Right Arm  (excision)   Skin cancer    Sleep apnea    Stroke [facility] 2008   Past Surgical History:  Procedure Laterality Date   ADENOIDECTOMY     APPENDECTOMY  1960   CATARACT EXTRACTION Bilateral    COLONOSCOPY     ESOPHAGOGASTRODUODENOSCOPY N/A 05/11/2023   Procedure: EGD (ESOPHAGOGASTRODUODENOSCOPY);  Surgeon: Wilhelmenia Aloha Raddle., MD;  Location: [facility];  Service: Gastroenterology;  Laterality: N/A;   GUM SURGERY  03/2019   LUMBAR DISC SURGERY  2014   MELANOMA EXCISION  2020   MESH APPLIED TO LAP PORT     SINUS EXPLORATION     11/17/2021 operation for right nasal cavity and 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   TONSILLECTOMY  1940    Allergies[1]  Outpatient Encounter Medications as of 01/04/2024  Medication Sig   ammonium lactate (AMLACTIN) 12 % cream Apply 1 Application topically as needed for dry skin.   azelastine  (ASTELIN ) 0.1 % nasal spray Place 2 sprays into both nostrils 2 (two) times daily as needed.   clopidogrel  (PLAVIX ) 75 MG tablet TAKE 1 TABLET BY MOUTH EVERY DAY   clotrimazole -betamethasone  (LOTRISONE ) cream Apply 1 Application topically as needed.   ketoconazole  (NIZORAL ) 2 % shampoo APPLY TOPICALLY 2 TIMES A WEEK   levothyroxine  (SYNTHROID ) 100 MCG tablet TAKE 1 TABLET BY MOUTH EVERY DAY BEFORE BREAKFAST   losartan  (COZAAR ) 50 MG tablet TAKE 1 TABLET BY MOUTH EVERY DAY   nitroGLYCERIN  (NITROSTAT ) 0.4 MG SL tablet Place 1 tablet (0.4 mg total) under the tongue every 5 (five) minutes as needed for up to 25 days for chest  pain.   nystatin  cream (MYCOSTATIN ) Apply 1 Application topically daily.   omeprazole  (PRILOSEC) 40 MG capsule TAKE 1 CAPSULE BY MOUTH 2 (TWO) TIMES DAILY BEFORE A MEAL.   oxymetazoline  (AFRIN) 0.05 % nasal spray Place 1 spray into both nostrils as needed for congestion.   pravastatin  (PRAVACHOL ) 40 MG tablet Take 1 tablet (40 mg total) by mouth daily.   PRESCRIPTION MEDICATION Take 2-4 mg by mouth every 6 (six) hours as  needed (dry mouth). Compounded Pilocarpine 4 mg/ml   propranolol  (INDERAL ) 20 MG tablet Take 20 mg three times a day as needed for palpitations   traMADol (ULTRAM) 50 MG tablet Take 50 mg by mouth every 6 (six) hours as needed.   Vitamin D, Ergocalciferol, (DRISDOL) 1.25 MG (50000 UNIT) CAPS capsule TAKE 1 CAPSULE BY MOUTH ONE TIME PER WEEK   Zolpidem  Tartrate 3.5 MG SUBL Take one by mouth at bedtime as needed.   Facility-Administered Encounter Medications as of 01/04/2024  Medication   clobetasol  cream (TEMOVATE ) 0.05 %    Review of Systems  Constitutional:  Negative for appetite change, chills, fatigue and fever.  Respiratory:  Negative for cough, chest tightness, shortness of breath and wheezing.   Cardiovascular:  Negative for chest pain, palpitations and leg swelling.  Musculoskeletal:  Negative for arthralgias, back pain, gait problem, joint swelling and myalgias.  Skin:  Negative for color change, pallor, rash and wound.       Right knee x 2 stitches   Neurological:  Negative for dizziness, weakness, light-headedness, numbness and headaches.  Hematological:  Does not bruise/bleed easily.    Immunization History  Administered Date(s) Administered   Hepatitis A 04/04/2000   Influenza-Unspecified 10/18/2014   Moderna Sars-Covid-2 Vaccination 01/22/2019, 02/19/2019, 06/05/2020   Pneumococcal Conjugate-13 12/20/2018   Pneumococcal Polysaccharide-23 01/18/2005   Tdap 11/25/2008   Zoster Recombinant(Shingrix) 12/20/2018, 01/20/2022, 09/15/2022   Pertinent  Health Maintenance Due  Topic Date Due   Colonoscopy  10/25/2021   Influenza Vaccine  08/19/2023   Bone Density Scan  Completed      12/09/2022    3:27 PM 12/30/2022   12:59 PM 04/19/2023    3:14 PM 05/19/2023    1:44 PM 12/30/2023    9:23 AM  Fall Risk  Falls in the past year? 0 0 0 0 1  Was there an injury with Fall? 0  0  0  0  1  Fall Risk Category Calculator 0 0 0 0 2  Patient at Risk for Falls Due to   No Fall  Risks No Fall Risks No Fall Risks  Fall risk Follow up   Falls evaluation completed Falls evaluation completed Falls evaluation completed     Data saved with a previous flowsheet row definition   Functional Status Survey:    Vitals:   01/04/24 1452  BP: (!) 118/58  Pulse: 75  Temp: (!) 97.2 F (36.2 C)  TempSrc: Temporal  SpO2: 95%  Weight: 158 lb 6.4 oz (71.8 kg)  Height: 5' 3 (1.6 m)   Body mass index is 28.06 kg/m. Physical Exam Vitals reviewed.  Constitutional:      General: She is not in acute distress.    Appearance: Normal appearance. She is normal weight. She is not ill-appearing or diaphoretic.  Cardiovascular:     Rate and Rhythm: Normal rate and regular rhythm.     Pulses: Normal pulses.     Heart sounds: Normal heart sounds. No murmur heard.    No friction rub. No gallop.  Pulmonary:     Effort: Pulmonary effort is normal. No respiratory distress.     Breath sounds: Normal breath sounds. No wheezing, rhonchi or rales.  Chest:     Chest wall: No tenderness.  Musculoskeletal:        General: No swelling or tenderness. Normal range of motion.     Cervical back: Normal range of motion. No rigidity or tenderness.     Right lower leg: No edema.     Left lower leg: No edema.  Lymphadenopathy:     Cervical: No cervical adenopathy.  Skin:    General: Skin is warm and dry.     Coloration: Skin is not pale.     Findings: No bruising, erythema, lesion or rash.     Comments: Right knee x 2 stitches removed without any difficulties.tolerated procedure well.No erythema, swelling or drainage noted. Stitches verified by CMA  Neurological:     Mental Status: She is alert and oriented to person, place, and time.     Cranial Nerves: No cranial nerve deficit.     Sensory: No sensory deficit.     Motor: No weakness.     Coordination: Coordination normal.     Gait: Gait normal.  Psychiatric:        Mood and Affect: Mood normal.        Speech: Speech normal.         Behavior: Behavior normal.     Labs reviewed: Recent Labs    08/10/23 1143 08/10/23 1144 08/11/23 0246 08/12/23 0254 09/01/23 1137  NA  --  146* 143  --  141  K  --  2.7* 4.3  --  4.3  CL  --  122* 111  --  109  CO2  --  17* 22  --  23  GLUCOSE  --  84 97  --  134*  BUN  --  9 11  --  18  CREATININE  --  0.47 0.73  --  0.76  CALCIUM   --  6.5* 9.5  --  9.4  MG 1.5*  --   --  2.4  --    Recent Labs    05/06/23 1114 08/10/23 1144 08/11/23 0246  AST 16 13* 19  ALT 14 12 16   ALKPHOS 60 39 55  BILITOT 0.7 0.6 0.9  PROT 6.3* 4.2* 6.3*  ALBUMIN 3.6 2.4* 3.5   Recent Labs    05/06/23 1114 07/22/23 1605 08/10/23 1144 08/11/23 0246 09/01/23 1137  WBC 5.6   < > 5.8 5.5 7.9  NEUTROABS 3.0  --  3.6  --   --   HGB 13.8   < > 13.5 13.8 13.5  HCT 42.9   < > 41.3 41.2 41.6  MCV 92.1   < > 91.2 89.4 92.7  PLT 231   < > 218 234 243   < > = values in this interval not displayed.   Lab Results  Component Value Date   TSH 14.75 (H) 10/11/2023   Lab Results  Component Value Date   HGBA1C 5.8 (H) 08/10/2023   Lab Results  Component Value Date   CHOL 163 05/12/2021   HDL 44 05/12/2021   LDLCALC 94 05/12/2021   TRIG 144 05/12/2021   CHOLHDL 3.5 12/26/2019    Significant Diagnostic Results in last 30 days:  LONG TERM MONITOR (3-14 DAYS) Result Date: 12/10/2023 Patch Wear Time:  4 days and 21 hours (2025-11-12T12:20:17-0500 to 2025-11-17T09:28:38-0500) Patient had a min HR of 39  bpm, max HR of 167 bpm, and avg HR of 56 bpm. Predominant underlying rhythm was Sinus Rhythm. 37 Supraventricular Tachycardia runs occurred, the run with the fastest interval lasting 6 beats with a max rate of 167 bpm, the longest lasting 15.4 secs with an avg rate of 108 bpm. Isolated SVEs were rare (<1.0%), SVE Couplets were rare (<1.0%), and SVE Triplets were rare (<1.0%). Isolated VEs were rare (<1.0%), and no VE Couplets or VE Triplets were present. Details: Patch Wear Time:  4 days and 21 hours  (2025-11-12T12:20:17-0500 to 2025-11-17T09:28:38-0500). Patient had a min HR of 39 bpm, max HR of 167 bpm, and avg HR of 56 bpm. Predominant Rhythm: Sinus bradycardia Arrhythmias: There were 37 episodes of supraventricular tachycardia (SVT). The longest episode lasted for 15.4 seconds. The fastest episode was 167 bpm. Ectopic Beats: There were rare premature atrial contractions (<1%) and rare premature ventricular contractions (<1%) Patient Triggered Events: There were 5 patient triggered events. Only 1 of the 5 patient triggered events occurred within close proximity to an SVT event. The other 4 events occurred during normal sinus rhythm.  Impressions: Predominant rhythm was sinus bradycardia Ectopic beats were rare and within normal limits. Fairly frequent episodes of supraventricular tachycardia that did not always correspond to patient symptoms. Lonnie T. Floretta HEATH, MD [place]  [facility] 12/10/2023 11:46 AM      Assessment/Plan  Encounter for removal of sutures (Primary) Afebrile  Right knee x 2 stitches removed without any difficulties.tolerated procedure well.No erythema, swelling or drainage noted. Stitches verified by CMA  Family/ staff Communication: Reviewed plan of care with patient verbalized understanding  Labs/tests ordered: None   Next Appointment:Return if symptoms worsen or fail to improve.  Total time: 20 minutes. Greater than 50% of total time spent doing patient education regarding x 2 stitches removal and health maintenance including symptom/medication management.   Gracieann Stannard C Adelynn Gipe, NP     [1]  Allergies Allergen Reactions   Lactose Intolerance (Gi) Other (See Comments)    indigestion   Norco [Hydrocodone-Acetaminophen ] Nausea And Vomiting   Dust Mite Extract Other (See Comments)    Sinus drainage   Oxycontin [Oxycodone Hcl] Nausea And Vomiting   Pollen Extract Other (See Comments)    Sinus drainage   "

## 2024-01-26 ENCOUNTER — Telehealth: Payer: Self-pay

## 2024-01-26 NOTE — Telephone Encounter (Signed)
 Patient states she will return to [facility] for testing since it is less than 1 mile from her home.

## 2024-01-26 NOTE — Telephone Encounter (Signed)
 Copied from CRM #[id number]. Topic: Clinical - Medical Advice >> Jan 26, 2024  2:07 PM Alfonso ORN wrote: Reason for CRM: patient need [name],Man has the flu since saturday at [facility] on all kinds of medication  patient want to get tested for the flu so can be around other people . Patient did ask the nurse its something they did not know and stated she would ask [name] Man   Patient is at [facility]     ----------------------------------------------------------------------- From previous Reason for Contact - Scheduling: Patient/patient representative is calling to schedule an appointment. Refer to attachments for appointment information.

## 2024-01-29 ENCOUNTER — Telehealth: Payer: Self-pay | Admitting: Family

## 2024-01-29 NOTE — Telephone Encounter (Signed)
 Patient called on call provider states has completed antibiotics Amoxicillin  for recent flu.patient requested refill for amoxicillin .patient advised to call office and schedule appointment to evaluate symptoms prior to prescribing antibiotics. Please follow up.

## 2024-01-30 ENCOUNTER — Telehealth: Payer: Self-pay

## 2024-01-30 NOTE — Telephone Encounter (Signed)
 Copied from CRM #[id number]. Topic: Clinical - Medical Advice >> Jan 26, 2024  2:07 PM Alfonso ORN wrote: Reason for CRM: patient need [name],Man has the flu since saturday at [facility] on all kinds of medication  patient want to get tested for the flu so can be around other people . Patient did ask the nurse its something they did not know and stated she would ask [name] Man   Patient is at [facility]     ----------------------------------------------------------------------- From previous Reason for Contact - Scheduling: Patient/patient representative is calling to schedule an appointment. Refer to attachments for appointment information. >> Jan 30, 2024  9:48 AM Graeme ORN wrote: Patient called. Diagnosed with flu last Monday. Has taken medication. Is feeling some better but still feels bad. Wants to know if she should be retested before going out and if so, where she can go. Thank You

## 2024-01-30 NOTE — Telephone Encounter (Signed)
 Noted. E2C2 it is okay to share the response from the provider below:   [name], [name], NP to Me (Selected Message)     01/30/24 12:21 PM Apartment nurse @ [facility] will test her for Flu. Thanks.

## 2024-02-05 ENCOUNTER — Other Ambulatory Visit: Payer: Self-pay | Admitting: Nurse Practitioner

## 2024-02-06 NOTE — Telephone Encounter (Signed)
 Pharmacy requested refill.  Pended Rx and sent to [facility] for approval.

## 2024-03-01 ENCOUNTER — Ambulatory Visit: Admitting: Cardiology
